# Patient Record
Sex: Male | Born: 1960 | Race: Black or African American | Hispanic: No | Marital: Single | Smoking: Former smoker
Health system: Southern US, Community
[De-identification: ages and names within clinical notes are randomized; demographics above are authoritative.]

## PROBLEM LIST (undated history)

## (undated) DIAGNOSIS — D638 Anemia in other chronic diseases classified elsewhere: Secondary | ICD-10-CM

## (undated) DIAGNOSIS — Z992 Dependence on renal dialysis: Secondary | ICD-10-CM

## (undated) DIAGNOSIS — N289 Disorder of kidney and ureter, unspecified: Secondary | ICD-10-CM

## (undated) DIAGNOSIS — N186 End stage renal disease: Secondary | ICD-10-CM

## (undated) DIAGNOSIS — E119 Type 2 diabetes mellitus without complications: Secondary | ICD-10-CM

## (undated) DIAGNOSIS — I10 Essential (primary) hypertension: Secondary | ICD-10-CM

## (undated) DIAGNOSIS — I5022 Chronic systolic (congestive) heart failure: Secondary | ICD-10-CM

## (undated) DIAGNOSIS — I480 Paroxysmal atrial fibrillation: Secondary | ICD-10-CM

## (undated) HISTORY — PX: INSERTION OF DIALYSIS CATHETER: SHX1324

---

## 1985-10-13 HISTORY — PX: LEG SURGERY: SHX1003

## 2013-07-09 ENCOUNTER — Emergency Department: Payer: Self-pay | Admitting: Emergency Medicine

## 2014-07-19 DIAGNOSIS — E119 Type 2 diabetes mellitus without complications: Secondary | ICD-10-CM | POA: Insufficient documentation

## 2014-07-24 DIAGNOSIS — R809 Proteinuria, unspecified: Secondary | ICD-10-CM | POA: Insufficient documentation

## 2018-08-29 ENCOUNTER — Encounter: Payer: Self-pay | Admitting: Emergency Medicine

## 2018-08-29 ENCOUNTER — Other Ambulatory Visit: Payer: Self-pay

## 2018-08-29 DIAGNOSIS — Z9114 Patient's other noncompliance with medication regimen: Secondary | ICD-10-CM

## 2018-08-29 DIAGNOSIS — E8809 Other disorders of plasma-protein metabolism, not elsewhere classified: Secondary | ICD-10-CM | POA: Diagnosis present

## 2018-08-29 DIAGNOSIS — N184 Chronic kidney disease, stage 4 (severe): Secondary | ICD-10-CM | POA: Diagnosis present

## 2018-08-29 DIAGNOSIS — Z7984 Long term (current) use of oral hypoglycemic drugs: Secondary | ICD-10-CM

## 2018-08-29 DIAGNOSIS — M436 Torticollis: Secondary | ICD-10-CM | POA: Diagnosis present

## 2018-08-29 DIAGNOSIS — K047 Periapical abscess without sinus: Secondary | ICD-10-CM | POA: Diagnosis present

## 2018-08-29 DIAGNOSIS — N179 Acute kidney failure, unspecified: Principal | ICD-10-CM | POA: Diagnosis present

## 2018-08-29 DIAGNOSIS — Z91120 Patient's intentional underdosing of medication regimen due to financial hardship: Secondary | ICD-10-CM

## 2018-08-29 DIAGNOSIS — I161 Hypertensive emergency: Secondary | ICD-10-CM | POA: Diagnosis present

## 2018-08-29 DIAGNOSIS — Z79899 Other long term (current) drug therapy: Secondary | ICD-10-CM

## 2018-08-29 DIAGNOSIS — Z833 Family history of diabetes mellitus: Secondary | ICD-10-CM

## 2018-08-29 DIAGNOSIS — D631 Anemia in chronic kidney disease: Secondary | ICD-10-CM | POA: Diagnosis present

## 2018-08-29 DIAGNOSIS — I129 Hypertensive chronic kidney disease with stage 1 through stage 4 chronic kidney disease, or unspecified chronic kidney disease: Secondary | ICD-10-CM | POA: Diagnosis present

## 2018-08-29 DIAGNOSIS — Z23 Encounter for immunization: Secondary | ICD-10-CM

## 2018-08-29 DIAGNOSIS — E1165 Type 2 diabetes mellitus with hyperglycemia: Secondary | ICD-10-CM | POA: Diagnosis present

## 2018-08-29 DIAGNOSIS — E1122 Type 2 diabetes mellitus with diabetic chronic kidney disease: Secondary | ICD-10-CM | POA: Diagnosis present

## 2018-08-29 LAB — GLUCOSE, CAPILLARY: Glucose-Capillary: 116 mg/dL — ABNORMAL HIGH (ref 70–99)

## 2018-08-29 NOTE — ED Notes (Signed)
Pt reports that he has a hx of HTN but is un medicated and has not seen an MD x 2 years ago.

## 2018-08-29 NOTE — ED Notes (Signed)
Pt reports just noticing some swelling to the left side of his face, near left ear and along jaw line; denies new medications; says he has poor dentition but no pain at this time

## 2018-08-29 NOTE — ED Triage Notes (Signed)
Pt is ambulatory to triage with c/o neck swelling and stiffness x 1 hour ago. Pt also reports that he is experiencing some facial swelling at this time. Pt denies eating or being exposed to anything new at this time. Pt is in NAD a this time.

## 2018-08-29 NOTE — ED Notes (Signed)
Per MD Archie Balboa, no further protocols at this time.

## 2018-08-30 ENCOUNTER — Encounter: Payer: Self-pay | Admitting: Internal Medicine

## 2018-08-30 ENCOUNTER — Inpatient Hospital Stay
Admission: EM | Admit: 2018-08-30 | Discharge: 2018-09-01 | DRG: 683 | Disposition: A | Discharge status: Home / Self Care | Payer: BLUE CROSS/BLUE SHIELD | Attending: Internal Medicine | Admitting: Internal Medicine

## 2018-08-30 DIAGNOSIS — N189 Chronic kidney disease, unspecified: Secondary | ICD-10-CM

## 2018-08-30 DIAGNOSIS — N184 Chronic kidney disease, stage 4 (severe): Secondary | ICD-10-CM | POA: Diagnosis present

## 2018-08-30 DIAGNOSIS — Z23 Encounter for immunization: Secondary | ICD-10-CM | POA: Diagnosis not present

## 2018-08-30 DIAGNOSIS — I129 Hypertensive chronic kidney disease with stage 1 through stage 4 chronic kidney disease, or unspecified chronic kidney disease: Secondary | ICD-10-CM | POA: Diagnosis present

## 2018-08-30 DIAGNOSIS — E8809 Other disorders of plasma-protein metabolism, not elsewhere classified: Secondary | ICD-10-CM | POA: Diagnosis present

## 2018-08-30 DIAGNOSIS — K047 Periapical abscess without sinus: Secondary | ICD-10-CM | POA: Diagnosis present

## 2018-08-30 DIAGNOSIS — N179 Acute kidney failure, unspecified: Secondary | ICD-10-CM

## 2018-08-30 DIAGNOSIS — M436 Torticollis: Secondary | ICD-10-CM | POA: Diagnosis present

## 2018-08-30 DIAGNOSIS — I10 Essential (primary) hypertension: Secondary | ICD-10-CM

## 2018-08-30 DIAGNOSIS — Z91120 Patient's intentional underdosing of medication regimen due to financial hardship: Secondary | ICD-10-CM | POA: Diagnosis not present

## 2018-08-30 DIAGNOSIS — E1122 Type 2 diabetes mellitus with diabetic chronic kidney disease: Secondary | ICD-10-CM | POA: Diagnosis present

## 2018-08-30 DIAGNOSIS — Z833 Family history of diabetes mellitus: Secondary | ICD-10-CM | POA: Diagnosis not present

## 2018-08-30 DIAGNOSIS — E1169 Type 2 diabetes mellitus with other specified complication: Secondary | ICD-10-CM

## 2018-08-30 DIAGNOSIS — E1165 Type 2 diabetes mellitus with hyperglycemia: Secondary | ICD-10-CM | POA: Diagnosis present

## 2018-08-30 DIAGNOSIS — Z9114 Patient's other noncompliance with medication regimen: Secondary | ICD-10-CM | POA: Diagnosis not present

## 2018-08-30 DIAGNOSIS — Z7984 Long term (current) use of oral hypoglycemic drugs: Secondary | ICD-10-CM | POA: Diagnosis not present

## 2018-08-30 DIAGNOSIS — I161 Hypertensive emergency: Secondary | ICD-10-CM

## 2018-08-30 DIAGNOSIS — Z79899 Other long term (current) drug therapy: Secondary | ICD-10-CM | POA: Diagnosis not present

## 2018-08-30 DIAGNOSIS — D631 Anemia in chronic kidney disease: Secondary | ICD-10-CM | POA: Diagnosis present

## 2018-08-30 HISTORY — DX: Type 2 diabetes mellitus without complications: E11.9

## 2018-08-30 HISTORY — DX: Essential (primary) hypertension: I10

## 2018-08-30 HISTORY — DX: Acute kidney failure, unspecified: N17.9

## 2018-08-30 LAB — COMPREHENSIVE METABOLIC PANEL
ALK PHOS: 103 U/L (ref 38–126)
ALT: 51 U/L — AB (ref 0–44)
AST: 56 U/L — AB (ref 15–41)
Albumin: 1.8 g/dL — ABNORMAL LOW (ref 3.5–5.0)
Anion gap: 3 — ABNORMAL LOW (ref 5–15)
BUN: 41 mg/dL — AB (ref 6–20)
CALCIUM: 7.4 mg/dL — AB (ref 8.9–10.3)
CHLORIDE: 114 mmol/L — AB (ref 98–111)
CO2: 23 mmol/L (ref 22–32)
Creatinine, Ser: 4.69 mg/dL — ABNORMAL HIGH (ref 0.61–1.24)
GFR calc non Af Amer: 13 mL/min — ABNORMAL LOW (ref >60)
GFR, EST AFRICAN AMERICAN: 14 mL/min — AB (ref >60)
GLUCOSE: 98 mg/dL (ref 70–99)
Potassium: 3.8 mmol/L (ref 3.5–5.1)
SODIUM: 140 mmol/L (ref 135–145)
Total Bilirubin: 0.4 mg/dL (ref 0.3–1.2)
Total Protein: 5.2 g/dL — ABNORMAL LOW (ref 6.5–8.1)

## 2018-08-30 LAB — GLUCOSE, CAPILLARY
Glucose-Capillary: 164 mg/dL — ABNORMAL HIGH (ref 70–99)
Glucose-Capillary: 161 mg/dL — ABNORMAL HIGH (ref 70–99)
Glucose-Capillary: 125 mg/dL — ABNORMAL HIGH (ref 70–99)
Glucose-Capillary: 114 mg/dL — ABNORMAL HIGH (ref 70–99)
Glucose-Capillary: 130 mg/dL — ABNORMAL HIGH (ref 70–99)

## 2018-08-30 LAB — URINALYSIS, ROUTINE W REFLEX MICROSCOPIC
BILIRUBIN URINE: NEGATIVE
Bacteria, UA: NONE SEEN
GLUCOSE, UA: 150 mg/dL — AB
Hgb urine dipstick: NEGATIVE
Ketones, ur: NEGATIVE mg/dL
Leukocytes, UA: NEGATIVE
NITRITE: NEGATIVE
Protein, ur: >=300 mg/dL — AB
SPECIFIC GRAVITY, URINE: 1.012 (ref 1.005–1.030)
Squamous Epithelial / LPF: NONE SEEN (ref 0–5)
pH: 6 (ref 5.0–8.0)

## 2018-08-30 LAB — CBC WITH DIFFERENTIAL/PLATELET
Abs Immature Granulocytes: 0.03 10*3/uL (ref 0.00–0.07)
BASOS PCT: 0 %
Basophils Absolute: 0 10*3/uL (ref 0.0–0.1)
EOS PCT: 2 %
Eosinophils Absolute: 0.1 10*3/uL (ref 0.0–0.5)
HCT: 32.3 % — ABNORMAL LOW (ref 39.0–52.0)
HEMOGLOBIN: 11 g/dL — AB (ref 13.0–17.0)
Immature Granulocytes: 0 %
LYMPHS PCT: 25 %
Lymphs Abs: 1.8 10*3/uL (ref 0.7–4.0)
MCH: 30.1 pg (ref 26.0–34.0)
MCHC: 34.1 g/dL (ref 30.0–36.0)
MCV: 88.5 fL (ref 80.0–100.0)
MONO ABS: 0.5 10*3/uL (ref 0.1–1.0)
MONOS PCT: 7 %
Neutro Abs: 4.8 10*3/uL (ref 1.7–7.7)
Neutrophils Relative %: 66 %
Platelets: 227 10*3/uL (ref 150–400)
RBC: 3.65 MIL/uL — AB (ref 4.22–5.81)
RDW: 12.8 % (ref 11.5–15.5)
WBC: 7.3 10*3/uL (ref 4.0–10.5)
nRBC: 0 % (ref 0.0–0.2)

## 2018-08-30 LAB — PHOSPHORUS: PHOSPHORUS: 4 mg/dL (ref 2.5–4.6)

## 2018-08-30 LAB — PROTEIN / CREATININE RATIO, URINE
CREATININE, URINE: 72 mg/dL
PROTEIN CREATININE RATIO: 11.19 mg/mg{creat} — AB (ref 0.00–0.15)
Total Protein, Urine: 806 mg/dL

## 2018-08-30 LAB — CREATININE, URINE, RANDOM: Creatinine, Urine: 75 mg/dL

## 2018-08-30 LAB — HEMOGLOBIN A1C
Hgb A1c MFr Bld: 5.5 % (ref 4.8–5.6)
Mean Plasma Glucose: 111.15 mg/dL

## 2018-08-30 LAB — TSH: TSH: 4.96 u[IU]/mL — ABNORMAL HIGH (ref 0.350–4.500)

## 2018-08-30 LAB — SODIUM, URINE, RANDOM: Sodium, Ur: 66 mmol/L

## 2018-08-30 MED ORDER — PNEUMOCOCCAL VAC POLYVALENT 25 MCG/0.5ML IJ INJ
0.5000 mL | INJECTION | INTRAMUSCULAR | Status: AC
  Administered 2018-09-01: 0.5 mL via INTRAMUSCULAR
  Filled 2018-08-30: qty 0.5

## 2018-08-30 MED ORDER — ONDANSETRON HCL 4 MG/2ML IJ SOLN: 4.0000 mg | Freq: Four times a day (QID) | INTRAMUSCULAR | Status: DC | PRN

## 2018-08-30 MED ORDER — METFORMIN HCL 1000 MG PO TABS: 1000.0000 mg | ORAL_TABLET | Freq: Two times a day (BID) | ORAL | 0 refills | Status: DC

## 2018-08-30 MED ORDER — PENICILLIN V POTASSIUM 500 MG PO TABS: 500.0000 mg | ORAL_TABLET | Freq: Four times a day (QID) | ORAL | 0 refills | Status: DC

## 2018-08-30 MED ORDER — NITROGLYCERIN 2 % TD OINT
0.5000 [in_us] | TOPICAL_OINTMENT | Freq: Four times a day (QID) | TRANSDERMAL | Status: DC
  Administered 2018-08-30 – 2018-09-01 (×8): 0.5 [in_us] via TOPICAL
  Filled 2018-08-30 (×8): qty 1

## 2018-08-30 MED ORDER — INSULIN ASPART 100 UNIT/ML ~~LOC~~ SOLN
0.0000 [IU] | Freq: Three times a day (TID) | SUBCUTANEOUS | Status: DC
  Administered 2018-08-30: 2 [IU] via SUBCUTANEOUS
  Administered 2018-08-30: 1 [IU] via SUBCUTANEOUS
  Administered 2018-08-31: 2 [IU] via SUBCUTANEOUS
  Filled 2018-08-30 (×3): qty 1

## 2018-08-30 MED ORDER — ACETAMINOPHEN 650 MG RE SUPP: 650.0000 mg | Freq: Four times a day (QID) | RECTAL | Status: DC | PRN

## 2018-08-30 MED ORDER — DOCUSATE SODIUM 100 MG PO CAPS
100.0000 mg | ORAL_CAPSULE | Freq: Two times a day (BID) | ORAL | Status: DC
  Administered 2018-08-30 – 2018-09-01 (×4): 100 mg via ORAL
  Filled 2018-08-30 (×5): qty 1

## 2018-08-30 MED ORDER — ONDANSETRON HCL 4 MG PO TABS: 4.0000 mg | ORAL_TABLET | Freq: Four times a day (QID) | ORAL | Status: DC | PRN

## 2018-08-30 MED ORDER — LABETALOL HCL 5 MG/ML IV SOLN
40.0000 mg | Freq: Once | INTRAVENOUS | Status: AC
  Administered 2018-08-30: 40 mg via INTRAVENOUS
  Filled 2018-08-30: qty 8

## 2018-08-30 MED ORDER — AMLODIPINE BESYLATE 5 MG PO TABS: 5.0000 mg | ORAL_TABLET | Freq: Every day | ORAL | 0 refills | Status: DC

## 2018-08-30 MED ORDER — PENICILLIN V POTASSIUM 250 MG PO TABS
500.0000 mg | ORAL_TABLET | Freq: Once | ORAL | Status: AC
  Administered 2018-08-30: 500 mg via ORAL
  Filled 2018-08-30: qty 2

## 2018-08-30 MED ORDER — ACETAMINOPHEN 325 MG PO TABS: 650.0000 mg | ORAL_TABLET | Freq: Four times a day (QID) | ORAL | Status: DC | PRN

## 2018-08-30 MED ORDER — AMOXICILLIN-POT CLAVULANATE 500-125 MG PO TABS
1.0000 | ORAL_TABLET | Freq: Two times a day (BID) | ORAL | Status: DC
  Administered 2018-08-30 – 2018-09-01 (×4): 500 mg via ORAL
  Filled 2018-08-30 (×6): qty 1

## 2018-08-30 MED ORDER — AMOXICILLIN 500 MG PO CAPS
500.0000 mg | ORAL_CAPSULE | Freq: Two times a day (BID) | ORAL | Status: DC
  Administered 2018-08-30: 500 mg via ORAL
  Filled 2018-08-30 (×2): qty 1

## 2018-08-30 MED ORDER — AMLODIPINE BESYLATE 10 MG PO TABS
10.0000 mg | ORAL_TABLET | Freq: Every day | ORAL | Status: DC
  Administered 2018-08-30 – 2018-09-01 (×3): 10 mg via ORAL
  Filled 2018-08-30 (×3): qty 1

## 2018-08-30 MED ORDER — INSULIN ASPART 100 UNIT/ML ~~LOC~~ SOLN: 0.0000 [IU] | Freq: Every day | SUBCUTANEOUS | Status: DC

## 2018-08-30 MED ORDER — LABETALOL HCL 5 MG/ML IV SOLN: 5.0000 mg | INTRAVENOUS | Status: DC | PRN

## 2018-08-30 MED ORDER — INFLUENZA VAC SPLIT QUAD 0.5 ML IM SUSY
0.5000 mL | PREFILLED_SYRINGE | INTRAMUSCULAR | Status: AC
  Administered 2018-09-01: 0.5 mL via INTRAMUSCULAR
  Filled 2018-08-30: qty 0.5

## 2018-08-30 MED ORDER — HEPARIN SODIUM (PORCINE) 5000 UNIT/ML IJ SOLN
5000.0000 [IU] | Freq: Three times a day (TID) | INTRAMUSCULAR | Status: DC
  Administered 2018-08-30 – 2018-09-01 (×7): 5000 [IU] via SUBCUTANEOUS
  Filled 2018-08-30 (×7): qty 1

## 2018-08-30 NOTE — ED Notes (Signed)
Patient reports that he has not taken any medication for hypertension or diabetes in approximately 2 years due to cost. Patient reports he has not had any blood work drawn for approximately the same amount.

## 2018-08-30 NOTE — Care Management (Signed)
RNCM consulted for medication needs. Reported that patient has been unable to get his medications for 2 years.   Per patient the above information is incorrect.  Patient states that he has had [employer] with medication coverage since 2016.  Patient states that since he had last weight he had not been checking his CBG or taking his DM medication.  Patient states "I have never been in a position where I couldn't get them".  Patient states that he has a glucometer at home, however it has been so long since he checked his CBG, that he wouldn't know where to find it.  Patient states "I will get a new one at [facility] after I get out of here"  RNCM requested DM consult  Patient does not have PCP.  Initially patient was hesitant to set up a new patient appointment, however he is now agreeable. Patient wishes to establish at [facility], as he has been a patient there before.  RNCM placed this request in the discharge follow up section of Epic.

## 2018-08-30 NOTE — Consult Note (Signed)
[facility]  CONSULT NOTE    Date: 08/30/2018                  Patient Name:  Casey Franco  MRN: [id number]  DOB: 03-Oct-1961  Age / Sex: 57 y.o., male         PCP: Patient, No Pcp Per                 Service Requesting Consult: Dr. Marcille Blanco                 Reason for Consult: Acute renal failure            History of Present Illness: Casey Franco is a 57 y.o. black male with diabetes mellitus type II, hypertension, who was admitted to [facility] on 08/30/2018 for Hypoalbuminemia [E88.09] Dental infection [K04.7] Hypertensive emergency [I16.1] Type 2 diabetes mellitus with other specified complication, without long-term current use of insulin ([place]) [E11.69] Chronic kidney disease, unspecified CKD stage [N18.9] Hypertension, unspecified type [I10]   Patient has not been seen by a medical provider for more than 2 years. He has not been taking any medications as outpatient.   Currently denies any symptoms other than his face swelling from dental abscess.    Medications: Outpatient medications: No medications prior to admission.    Current medications: Current Facility-Administered Medications  Medication Dose Route Frequency Provider Last Rate Last Dose  . acetaminophen (TYLENOL) tablet 650 mg  650 mg Oral Q6H PRN Harrie Foreman, MD       Or  . acetaminophen (TYLENOL) suppository 650 mg  650 mg Rectal Q6H PRN Harrie Foreman, MD      . amLODipine (NORVASC) tablet 10 mg  10 mg Oral Daily Harrie Foreman, MD   10 mg at 08/30/18 0650  . amoxicillin (AMOXIL) capsule 500 mg  500 mg Oral Q12H Harrie Foreman, MD   500 mg at 08/30/18 0945  . docusate sodium (COLACE) capsule 100 mg  100 mg Oral BID Harrie Foreman, MD      . heparin injection 5,000 Units  5,000 Units Subcutaneous Q8H Harrie Foreman, MD   5,000 Units at 08/30/18 [phone]  . [START ON 08/31/2018] Influenza vac split quadrivalent PF (FLUARIX) injection 0.5 mL  0.5 mL  Intramuscular Tomorrow-1000 Harrie Foreman, MD      . insulin aspart (novoLOG) injection 0-5 Units  0-5 Units Subcutaneous QHS Harrie Foreman, MD      . insulin aspart (novoLOG) injection 0-9 Units  0-9 Units Subcutaneous TID WC Harrie Foreman, MD   2 Units at 08/30/18 0945  . labetalol (NORMODYNE,TRANDATE) injection 5-10 mg  5-10 mg Intravenous Q2H PRN Harrie Foreman, MD      . nitroGLYCERIN (NITROGLYN) 2 % ointment 0.5 inch  0.5 inch Topical Q6H Harrie Foreman, MD   0.5 inch at 08/30/18 0535  . ondansetron (ZOFRAN) tablet 4 mg  4 mg Oral Q6H PRN Harrie Foreman, MD       Or  . ondansetron [facility]) injection 4 mg  4 mg Intravenous Q6H PRN Harrie Foreman, MD      . Derrill Memo ON 08/31/2018] pneumococcal 23 valent vaccine (PNU-IMMUNE) injection 0.5 mL  0.5 mL Intramuscular Tomorrow-1000 Harrie Foreman, MD          Allergies: No Known Allergies    Past Medical History: Past Medical History:  Diagnosis Date  . Diabetes mellitus without complication ([place])   .  Hypertension      Past Surgical History: Past Surgical History:  Procedure Laterality Date  . LEG SURGERY  1987     Family History: Family History  Problem Relation Age of Onset  . Diabetes Mellitus II Sister   . Diabetes Mellitus II Paternal Grandmother      Social History: Social History   Socioeconomic History  . Marital status: Single    Spouse name: Not on file  . Number of children: Not on file  . Years of education: Not on file  . Highest education level: Not on file  Occupational History  . Not on file  Social Needs  . Financial resource strain: Not on file  . Food insecurity:    Worry: Not on file    Inability: Not on file  . Transportation needs:    Medical: Not on file    Non-medical: Not on file  Tobacco Use  . Smoking status: Never Smoker  . Smokeless tobacco: Never Used  Substance and Sexual Activity  . Alcohol use: Yes  . Drug use: Yes    Types: Cocaine     Comment: Friday 08/27/2018  . Sexual activity: Not on file  Lifestyle  . Physical activity:    Days per week: Not on file    Minutes per session: Not on file  . Stress: Not on file  Relationships  . Social connections:    Talks on phone: Not on file    Gets together: Not on file    Attends religious service: Not on file    Active member of club or organization: Not on file    Attends meetings of clubs or organizations: Not on file    Relationship status: Not on file  . Intimate partner violence:    Fear of current or ex partner: Not on file    Emotionally abused: Not on file    Physically abused: Not on file    Forced sexual activity: Not on file  Other Topics Concern  . Not on file  Social History Narrative  . Not on file     Review of Systems: Review of Systems  Constitutional: Negative.   HENT: Negative.   Eyes: Negative.   Respiratory: Negative.   Cardiovascular: Negative.   Gastrointestinal: Negative.   Genitourinary: Negative.   Musculoskeletal: Negative.   Skin: Negative.   Neurological: Negative.   Endo/Heme/Allergies: Negative.   Psychiatric/Behavioral: Negative.     Vital Signs: Blood pressure 135/89, pulse 80, temperature 98.3 F (36.8 C), temperature source Oral, resp. rate 16, height 5' 11.5" (1.816 m), weight 63.5 kg, SpO2 100 %.  Weight trends: Filed Weights   08/29/18 2222  Weight: 63.5 kg    Physical Exam: General: NAD, laying in bed  Head: Normocephalic, atraumatic. Moist oral mucosal membranes  Eyes: Anicteric, PERRL  Neck: Supple, trachea midline  Lungs:  Clear to auscultation  Heart: Regular rate and rhythm  Abdomen:  Soft, nontender,   Extremities: no peripheral edema.  Neurologic: Nonfocal, moving all four extremities  Skin: No lesions  Access: none     Lab results: Basic Metabolic Panel: Recent Labs  Lab 08/30/18 0128  NA 140  K 3.8  CL 114*  CO2 23  GLUCOSE 98  BUN 41*  CREATININE 4.69*  CALCIUM 7.4*    Liver  Function Tests: Recent Labs  Lab 08/30/18 0128  AST 56*  ALT 51*  ALKPHOS 103  BILITOT 0.4  PROT 5.2*  ALBUMIN 1.8*   No results for  input(s): LIPASE, AMYLASE in the last 168 hours. No results for input(s): AMMONIA in the last 168 hours.  CBC: Recent Labs  Lab 08/30/18 0128  WBC 7.3  NEUTROABS 4.8  HGB 11.0*  HCT 32.3*  MCV 88.5  PLT 227    Cardiac Enzymes: No results for input(s): CKTOTAL, CKMB, CKMBINDEX, TROPONINI in the last 168 hours.  BNP: Invalid input(s): POCBNP  CBG: Recent Labs  Lab 08/29/18 2234 08/30/18 0747 08/30/18 1141  GLUCAP 116* 164* 161*    Microbiology: No results found for this or any previous visit.  Coagulation Studies: No results for input(s): LABPROT, INR in the last 72 hours.  Urinalysis: No results for input(s): COLORURINE, LABSPEC, PHURINE, GLUCOSEU, HGBUR, BILIRUBINUR, KETONESUR, PROTEINUR, UROBILINOGEN, NITRITE, LEUKOCYTESUR in the last 72 hours.  Invalid input(s): APPERANCEUR    Imaging:  No results found.   Assessment & Plan: Casey Franco is a 57 y.o. black male with diabetes mellitus type II, hypertension, who was admitted to [facility] on 08/30/2018  1. Acute renal failure on chronic kidney disease NOS versus progression of chronic kidney disease to stage V.  Last creatinine available is 1.3, normal GFR on 07/10/2014.  No acute indication for dialysis. Dialysis education provided.  - Check SPEP/UPEP, ANA, ANCA, anti-GBM, HIV, viral hepatitis panel, serum complements - Check urine studies - Check renal ultrasound - No indication for IV fluids  2. Hypertension: elevated. Started on amlodipine.   3. Anemia of chronic kidney disease: hemoglobin 11  4. Hypocalcemia: corrected calcium of 9.2. Due hypoalbuminemia - Check PTH and phosphorus.     LOS: 0 Amorette Charrette 11/18/20191:54 PM

## 2018-08-30 NOTE — H&P (Signed)
Casey Franco is an 57 y.o. male.   Chief Complaint: Facial swelling HPI: The patient with past medical history of hypertension and diabetes presents to the emergency department concerned about swelling of his lower jaw.  The patient denies pain or fever but admits to fullness and tenderness of his lower jaw bilaterally.  He has had issues with his dentition before but does not recall when his mouth began to swell with this encounter.  He admits to feeling generally unwell and notes that he is not been controlling his diabetes.  In the emergency department he was found to have tolerated hypertension that was not responsive to labetalol.  Laboratory evaluation also revealed severe renal insufficiency which prompted the emergency department staff to call hospitalist service for admission.  Past Medical History:  Diagnosis Date  . Diabetes mellitus without complication ([place])   . Hypertension     Past Surgical History:  Procedure Laterality Date  . LEG SURGERY  1987    Family History  Problem Relation Age of Onset  . Diabetes Mellitus II Sister   . Diabetes Mellitus II Paternal Grandmother    Social History:  reports that he has never smoked. He has never used smokeless tobacco. He reports that he drinks alcohol. He reports that he has current or past drug history. Drug: Cocaine.  Allergies: No Known Allergies  No medications prior to admission.    Results for orders placed or performed during the hospital encounter of 08/30/18 (from the past 48 hour(s))  Glucose, capillary     Status: Abnormal   Collection Time: 08/29/18 10:34 PM  Result Value Ref Range   Glucose-Capillary 116 (H) 70 - 99 mg/dL  Comprehensive metabolic panel     Status: Abnormal   Collection Time: 08/30/18  1:28 AM  Result Value Ref Range   Sodium 140 135 - 145 mmol/L   Potassium 3.8 3.5 - 5.1 mmol/L   Chloride 114 (H) 98 - 111 mmol/L   CO2 23 22 - 32 mmol/L   Glucose, Bld 98 70 - 99 mg/dL   BUN 41 (H) 6 - 20  mg/dL   Creatinine, Ser 4.69 (H) 0.61 - 1.24 mg/dL   Calcium 7.4 (L) 8.9 - 10.3 mg/dL   Total Protein 5.2 (L) 6.5 - 8.1 g/dL   Albumin 1.8 (L) 3.5 - 5.0 g/dL   AST 56 (H) 15 - 41 U/L   ALT 51 (H) 0 - 44 U/L   Alkaline Phosphatase 103 38 - 126 U/L   Total Bilirubin 0.4 0.3 - 1.2 mg/dL   GFR calc non Af Amer 13 (L) >60 mL/min   GFR calc Af Amer 14 (L) >60 mL/min    Comment: (NOTE) The eGFR has been calculated using the CKD EPI equation. This calculation has not been validated in all clinical situations. eGFR's persistently <60 mL/min signify possible Chronic Kidney Disease.    Anion gap 3 (L) 5 - 15    Comment: Performed at [facility], [address] [place], [place] [address]  CBC with Differential     Status: Abnormal   Collection Time: 08/30/18  1:28 AM  Result Value Ref Range   WBC 7.3 4.0 - 10.5 K/uL   RBC 3.65 (L) 4.22 - 5.81 MIL/uL   Hemoglobin 11.0 (L) 13.0 - 17.0 g/dL   HCT 32.3 (L) 39.0 - 52.0 %   MCV 88.5 80.0 - 100.0 fL   MCH 30.1 26.0 - 34.0 pg   MCHC 34.1 30.0 - 36.0  g/dL   RDW 12.8 11.5 - 15.5 %   Platelets 227 150 - 400 K/uL   nRBC 0.0 0.0 - 0.2 %   Neutrophils Relative % 66 %   Neutro Abs 4.8 1.7 - 7.7 K/uL   Lymphocytes Relative 25 %   Lymphs Abs 1.8 0.7 - 4.0 K/uL   Monocytes Relative 7 %   Monocytes Absolute 0.5 0.1 - 1.0 K/uL   Eosinophils Relative 2 %   Eosinophils Absolute 0.1 0.0 - 0.5 K/uL   Basophils Relative 0 %   Basophils Absolute 0.0 0.0 - 0.1 K/uL   Immature Granulocytes 0 %   Abs Immature Granulocytes 0.03 0.00 - 0.07 K/uL    Comment: Performed at [facility], [address] [place], [place] [address]   No results found.  Review of Systems  Constitutional: Negative for chills and fever.  HENT: Negative for sore throat and tinnitus.   Eyes: Negative for blurred vision and redness.  Respiratory: Negative for cough and shortness of breath.   Cardiovascular: Negative for chest pain, palpitations, orthopnea and  PND.  Gastrointestinal: Negative for abdominal pain, diarrhea, nausea and vomiting.  Genitourinary: Negative for dysuria, frequency and urgency.  Musculoskeletal: Negative for joint pain and myalgias.  Skin: Negative for rash.       No lesions  Neurological: Negative for speech change, focal weakness and weakness.  Endo/Heme/Allergies: Does not bruise/bleed easily.       No temperature intolerance  Psychiatric/Behavioral: Negative for depression and suicidal ideas.    Blood pressure (!) 195/131, pulse 71, temperature 98.2 F (36.8 C), temperature source Oral, resp. rate 18, height 5' 11.5" (1.816 m), weight 63.5 kg, SpO2 100 %. Physical Exam  Vitals reviewed. Constitutional: He is oriented to person, place, and time. He appears well-developed and well-nourished. No distress.  HENT:  Head: Normocephalic and atraumatic.  Mouth/Throat: Oropharynx is clear and moist.  Eyes: Pupils are equal, round, and reactive to light. Conjunctivae and EOM are normal. No scleral icterus.  Neck: Normal range of motion. Neck supple. No JVD present. No tracheal deviation present. No thyromegaly present.  Cardiovascular: Normal rate, regular rhythm and normal heart sounds. Exam reveals no gallop and no friction rub.  No murmur heard. Respiratory: Effort normal and breath sounds normal. No respiratory distress.  GI: Soft. Bowel sounds are normal. He exhibits no distension. There is no tenderness.  Genitourinary:  Genitourinary Comments: Deferred  Musculoskeletal: Normal range of motion. He exhibits no edema.  Lymphadenopathy:    He has no cervical adenopathy.  Neurological: He is alert and oriented to person, place, and time. No cranial nerve deficit.  Skin: Skin is warm and dry. No rash noted. No erythema.  Psychiatric: He has a normal mood and affect. His behavior is normal. Judgment and thought content normal.     Assessment/Plan This is a 57 year old male admitted for acute kidney injury. 1.  Acute  kidney injury: Baseline renal function not available.  The patient likely has a long-standing renal insufficiency secondary to uncontrolled diabetes and hypertension.  Hydrate with intravenous fluid.  Obtain urine sodium and creatinine.  Consult nephrology. 2.  Hypertension: Uncontrolled.  I have started the patient on amlodipine.  I also applied Nitropaste to his chest.  Labetalol as needed 3.  Diabetes mellitus type 2: Rating scale insulin while hospitalized.  Check hemoglobin A1c 4.  Dental abscess: No signs or symptoms of sepsis or airway compromise.  Continue amoxicillin 5.  DVT prophylaxis: Heparin 6.  GI prophylaxis: None  Patient is a full code.  Time spent on admission orders and patient care approximately 45 minutes  Harrie Foreman, MD 08/30/2018, 6:31 AM

## 2018-08-30 NOTE — Progress Notes (Signed)
Patient admitted to [facility] with the diagnosis of AKI. Alert and oriented x 4. Denied any acute pain at this time. Patient oriented to his room, ascom, call bell and staff. Full assessment to epic completed.  Admission BP=195/131, Norvasc scheduled administered and will be rechecked in an hour ans see if he will need PRN BP med. Will continue to monitor.

## 2018-08-30 NOTE — ED Provider Notes (Signed)
[facility] Emergency Department Provider Note  ____________________________________________   First MD Initiated Contact with Patient 08/30/18 0119     (approximate)  I have reviewed the triage vital signs and the nursing notes.   HISTORY  Chief Complaint Torticollis   HPI Casey Franco is a 57 y.o. male who self presents to the emergency department with painless swelling to the left side of his face near his left ear and along the right side of his face that he noticed earlier this evening.  He was in his usual state of health when he went to the bathroom and and looking at the mirror noticed that his face was swollen and await it never been before which concerned him.  The patient has a long-standing past medical history of both hypertension and diabetes mellitus although he is taken no medications in over 2 years as he has not had health insurance.  He recently started a new job and became insured again and he is interested in restarting his medications.  He denies fevers or chills.  He denies chest pain or shortness of breath.  He denies abdominal pain nausea or vomiting.  He has poor dentition and has not seen a dentist in "a long time".  He denies difficulty swallowing or voice changes.  He denies ear pain.  He denies headache.  He does have a remote history of heavy alcohol abuse in his 59s along with remote IV cocaine use.  He continues to drink beer daily although "only 1 or 2".  His symptoms today came on suddenly and seemed to have quickly gone away nearly completely on their own.  Nothing seems to make them better or worse.    Past Medical History:  Diagnosis Date  . Diabetes mellitus without complication ([place])   . Hypertension     Patient Active Problem List   Diagnosis Date Noted  . AKI (acute kidney injury) ([place]) 08/30/2018    Past Surgical History:  Procedure Laterality Date  . LEG SURGERY  1987    Prior to Admission medications     Medication Sig Start Date End Date Taking? Authorizing Provider  amLODipine (NORVASC) 5 MG tablet Take 1 tablet (5 mg total) by mouth daily. 08/30/18 09/29/18  Darel Hong, MD  metFORMIN (GLUCOPHAGE) 1000 MG tablet Take 1 tablet (1,000 mg total) by mouth 2 (two) times daily. 08/30/18 08/30/19  Darel Hong, MD  penicillin v potassium (VEETID) 500 MG tablet Take 1 tablet (500 mg total) by mouth 4 (four) times daily for 10 days. 08/30/18 09/09/18  Darel Hong, MD    Allergies Patient has no known allergies.  Family History  Problem Relation Age of Onset  . Diabetes Mellitus II Sister   . Diabetes Mellitus II Paternal Grandmother     Social History Social History   Tobacco Use  . Smoking status: Never Smoker  . Smokeless tobacco: Never Used  Substance Use Topics  . Alcohol use: Yes  . Drug use: Yes    Types: Cocaine    Comment: Friday 08/27/2018    Review of Systems Constitutional: No fever/chills Eyes: No visual changes. ENT: Positive for facial swelling Cardiovascular: Denies chest pain. Respiratory: Denies shortness of breath. Gastrointestinal: No abdominal pain.  No nausea, no vomiting.  No diarrhea.  No constipation. Genitourinary: Negative for dysuria. Musculoskeletal: Negative for back pain. Skin: Negative for rash. Neurological: Negative for headaches, focal weakness or numbness.   ____________________________________________   PHYSICAL EXAM:  VITAL SIGNS: ED Triage  Vitals  Enc Vitals Group     BP 08/29/18 2222 (!) 183/107     Pulse Rate 08/29/18 2222 88     Resp 08/29/18 2222 18     Temp 08/29/18 2222 98.5 F (36.9 C)     Temp Source 08/29/18 2222 Oral     SpO2 08/29/18 2222 99 %     Weight 08/29/18 2222 140 lb (63.5 kg)     Height 08/29/18 2222 5' 11.5" (1.816 m)     Head Circumference --      Peak Flow --      Pain Score 08/29/18 2226 0     Pain Loc --      Pain Edu? --      Excl. in [place]? --     Constitutional: Alert and oriented  x4 pleasant cooperative speaks in full clear sentences no diaphoresis Eyes: PERRL EOMI. midrange and brisk Head: Atraumatic. Nose: No congestion/rhinnorhea. Mouth/Throat: No trismus.  Uvula midline no pharyngeal erythema or exudate.  Very poor dentition with several loose of rotten teeth.  He does have some tenderness to left lower tooth to percussion although no obvious abscess No sublingual or submental induration or fullness. Neck: No stridor.   Cardiovascular: Normal rate, regular rhythm. Grossly normal heart sounds.  Good peripheral circulation. Respiratory: Normal respiratory effort.  No retractions. Lungs CTAB and moving good air Gastrointestinal: Soft nondistended nontender no rebound or guarding no peritonitis Musculoskeletal: No lower extremity edema   Neurologic:  Normal speech and language. No gross focal neurologic deficits are appreciated. Skin:  Skin is warm, dry and intact. No rash noted. Psychiatric: Mood and affect are normal. Speech and behavior are normal.    ____________________________________________   DIFFERENTIAL includes but not limited to  Dental infection, periapical abscess, Ludwig angina, otitis media, DKA, HHS, hypertensive emergency ____________________________________________   LABS (all labs ordered are listed, but only abnormal results are displayed)  Labs Reviewed  GLUCOSE, CAPILLARY - Abnormal; Notable for the following components:      Result Value   Glucose-Capillary 116 (*)    All other components within normal limits  COMPREHENSIVE METABOLIC PANEL - Abnormal; Notable for the following components:   Chloride 114 (*)    BUN 41 (*)    Creatinine, Ser 4.69 (*)    Calcium 7.4 (*)    Total Protein 5.2 (*)    Albumin 1.8 (*)    AST 56 (*)    ALT 51 (*)    GFR calc non Af Amer 13 (*)    GFR calc Af Amer 14 (*)    Anion gap 3 (*)    All other components within normal limits  CBC WITH DIFFERENTIAL/PLATELET - Abnormal; Notable for the  following components:   RBC 3.65 (*)    Hemoglobin 11.0 (*)    HCT 32.3 (*)    All other components within normal limits  GLUCOSE, CAPILLARY - Abnormal; Notable for the following components:   Glucose-Capillary 164 (*)    All other components within normal limits  CREATININE, URINE, RANDOM  SODIUM, URINE, RANDOM  TSH  HEMOGLOBIN A1C    Lab work reviewed by me with a number of very concerning abnormalities.  His creatinine today is 4.7 and his albumin is 1.8 both of which are new __________________________________________  EKG   ____________________________________________  RADIOLOGY   ____________________________________________   PROCEDURES  Procedure(s) performed: no  .Critical Care Performed by: Darel Hong, MD Authorized by: Darel Hong, MD   Critical care provider statement:  Critical care time (minutes):  30   Critical care time was exclusive of:  Separately billable procedures and treating other patients   Critical care was necessary to treat or prevent imminent or life-threatening deterioration of the following conditions:  Renal failure   Critical care was time spent personally by me on the following activities:  Development of treatment plan with patient or surrogate, discussions with consultants, evaluation of patient's response to treatment, examination of patient, obtaining history from patient or surrogate, ordering and performing treatments and interventions, ordering and review of laboratory studies, ordering and review of radiographic studies, pulse oximetry, re-evaluation of patient's condition and review of old charts    Critical Care performed: Yes  ____________________________________________   INITIAL IMPRESSION / ASSESSMENT AND PLAN / ED COURSE  Pertinent labs & imaging results that were available during my care of the patient were reviewed by me and considered in my medical decision making (see chart for details).   As part of my  medical decision making, I reviewed the following data within the [place] History obtained from family if available, nursing notes, old chart and ekg, as well as notes from prior ED visits.  The patient comes to the emergency department with left-sided facial swelling that has rapidly improved on its own.  He does have extremely poor dentition is some tenderness left lower molar and I think his swelling is likely related to a dental infection.  I noted that the patient's blood pressure was in the 180s today and when discussing his past medical history he told me he has not taken any medications for hypertension or diabetes in over 2 years.  This concern to me so I checked a CBC and a history to evaluate renal function prior to initiating any antihypertensives or medication for his diabetes.  Unfortunately his lab work is extremely abnormal with a creatinine of 4.7 and an albumin of 1.8.  I rechecked his blood pressure and it was 210/110 so I ordered 40 mg of IV labetalol to emergently bring it down as he clearly has endorgan damage likely from his hypertension.  At this point given the patient's acute hypertensive emergency he requires inpatient admission for aggressive management of his blood pressure and work-up of his renal failure as well as his hypoalbuminemia.  He has no obvious ascites on exam however I am concerned that his remote IV drug use could have caused occult hepatitis C and cirrhosis.  The patient verbalizes understanding and agreement with the plan.  I then discussed with the hospitalist Dr. Marcille Blanco who has graciously agreed to admit the patient to his service.      ____________________________________________   FINAL CLINICAL IMPRESSION(S) / ED DIAGNOSES  Final diagnoses:  Dental infection  Type 2 diabetes mellitus with other specified complication, without long-term current use of insulin ([place])  Hypertension, unspecified type  Hypoalbuminemia  Chronic kidney  disease, unspecified CKD stage  Hypertensive emergency      NEW MEDICATIONS STARTED DURING THIS VISIT:  Current Discharge Medication List    START taking these medications   Details  amLODipine (NORVASC) 5 MG tablet Take 1 tablet (5 mg total) by mouth daily. Qty: 30 tablet, Refills: 0    metFORMIN (GLUCOPHAGE) 1000 MG tablet Take 1 tablet (1,000 mg total) by mouth 2 (two) times daily. Qty: 60 tablet, Refills: 0    penicillin v potassium (VEETID) 500 MG tablet Take 1 tablet (500 mg total) by mouth 4 (four) times daily for 10 days.  Qty: 40 tablet, Refills: 0         Note:  This document was prepared using Dragon voice recognition software and may include unintentional dictation errors.     Darel Hong, MD 08/30/18 [phone]

## 2018-08-30 NOTE — ED Notes (Signed)
Patient to stat desk in no acute distress asking about wait time. Patient given an update. Patient verbalizes understanding.

## 2018-08-30 NOTE — Progress Notes (Signed)
[place] at [facility]                                                                                                                                                                                  Patient Demographics   Shaun Runyon, is a 57 y.o. male, DOB - [birth year], QPY:[id number]  Admit date - 08/30/2018   Admitting Physician Harrie Foreman, MD  Outpatient Primary MD for the patient is Patient, No Pcp Per   LOS - 0  Subjective: Patient admitted with acute kidney injury, seen by nephrology Complaints   Review of Systems:   CONSTITUTIONAL: No documented fever. No fatigue, weakness. No weight gain, no weight loss.  EYES: No blurry or double vision.  ENT: No tinnitus. No postnasal drip. No redness of the oropharynx.  RESPIRATORY: No cough, no wheeze, no hemoptysis. No dyspnea.  CARDIOVASCULAR: No chest pain. No orthopnea. No palpitations. No syncope.  GASTROINTESTINAL: No nausea, no vomiting or diarrhea. No abdominal pain. No melena or hematochezia.  GENITOURINARY: No dysuria or hematuria.  ENDOCRINE: No polyuria or nocturia. No heat or cold intolerance.  HEMATOLOGY: No anemia. No bruising. No bleeding.  INTEGUMENTARY: No rashes. No lesions.  MUSCULOSKELETAL: No arthritis. No swelling. No gout.  NEUROLOGIC: No numbness, tingling, or ataxia. No seizure-type activity.  PSYCHIATRIC: No anxiety. No insomnia. No ADD.    Vitals:   Vitals:   08/30/18 0530 08/30/18 0600 08/30/18 0630 08/30/18 1210  BP: (!) 198/108 (!) 180/102 (!) 195/131 135/89  Pulse: 73 71 71 80  Resp: (!) 21 18 18 16   Temp:   98.2 F (36.8 C) 98.3 F (36.8 C)  TempSrc:   Oral Oral  SpO2: 100% 100% 100% 100%  Weight:      Height:        Wt Readings from Last 3 Encounters:  08/29/18 63.5 kg     Intake/Output Summary (Last 24 hours) at 08/30/2018 1603 Last data filed at 08/30/2018 1300 [place] per 24 hour  Intake 600 ml  Output 200 ml  Net 400 ml    Physical  Exam:   GENERAL: Pleasant-appearing in no apparent distress.  HEAD, EYES, EARS, NOSE AND THROAT: Atraumatic, normocephalic. Extraocular muscles are intact. Pupils equal and reactive to light. Sclerae anicteric. No conjunctival injection. No oro-pharyngeal erythema.  NECK: Supple. There is no jugular venous distention. No bruits, no lymphadenopathy, no thyromegaly.  HEART: Regular rate and rhythm,. No murmurs, no rubs, no clicks.  LUNGS: Clear to auscultation bilaterally. No rales or rhonchi. No wheezes.  ABDOMEN: Soft, flat, nontender, nondistended. Has good bowel sounds. No hepatosplenomegaly appreciated.  EXTREMITIES: No evidence of any cyanosis, clubbing, or peripheral edema.  +  2 pedal and radial pulses bilaterally.  NEUROLOGIC: The patient is alert, awake, and oriented x3 with no focal motor or sensory deficits appreciated bilaterally.  SKIN: Moist and warm with no rashes appreciated.  Psych: Not anxious, depressed LN: No inguinal LN enlargement    Antibiotics   Anti-infectives (From admission, onward)   Start     Dose/Rate Route Frequency Ordered Stop   08/30/18 2200  amoxicillin-clavulanate (AUGMENTIN) 500-125 MG per tablet 500 mg     1 tablet Oral 2 times daily 08/30/18 1457     08/30/18 1000  amoxicillin (AMOXIL) capsule 500 mg  Status:  Discontinued     500 mg Oral Every 12 hours 08/30/18 0634 08/30/18 1457   08/30/18 0130  penicillin v potassium (VEETID) tablet 500 mg     500 mg Oral  Once 08/30/18 0128 08/30/18 0152   08/30/18 0000  penicillin v potassium (VEETID) 500 MG tablet     500 mg Oral 4 times daily 08/30/18 0130 09/09/18 2359      Medications   Scheduled Meds: . amLODipine  10 mg Oral Daily  . amoxicillin-clavulanate  1 tablet Oral BID  . docusate sodium  100 mg Oral BID  . heparin  5,000 Units Subcutaneous Q8H  . [START ON 08/31/2018] Influenza vac split quadrivalent PF  0.5 mL Intramuscular Tomorrow-1000  . insulin aspart  0-5 Units Subcutaneous QHS  .  insulin aspart  0-9 Units Subcutaneous TID WC  . nitroGLYCERIN  0.5 inch Topical Q6H  . [START ON 08/31/2018] pneumococcal 23 valent vaccine  0.5 mL Intramuscular Tomorrow-1000   Continuous Infusions: PRN Meds:.acetaminophen **OR** acetaminophen, labetalol, ondansetron **OR** ondansetron (ZOFRAN) IV   Data Review:   Micro Results No results found for this or any previous visit (from the past 240 hour(s)).  Radiology Reports No results found.   CBC Recent Labs  Lab 08/30/18 0128  WBC 7.3  HGB 11.0*  HCT 32.3*  PLT 227  MCV 88.5  MCH 30.1  MCHC 34.1  RDW 12.8  LYMPHSABS 1.8  MONOABS 0.5  EOSABS 0.1  BASOSABS 0.0    Chemistries  Recent Labs  Lab 08/30/18 0128  NA 140  K 3.8  CL 114*  CO2 23  GLUCOSE 98  BUN 41*  CREATININE 4.69*  CALCIUM 7.4*  AST 56*  ALT 51*  ALKPHOS 103  BILITOT 0.4   ------------------------------------------------------------------------------------------------------------------ estimated creatinine clearance is 15.4 mL/min (A) (by C-G formula based on SCr of 4.69 mg/dL (H)). ------------------------------------------------------------------------------------------------------------------ Recent Labs    08/30/18 0128  HGBA1C 5.5   ------------------------------------------------------------------------------------------------------------------ No results for input(s): CHOL, HDL, LDLCALC, TRIG, CHOLHDL, LDLDIRECT in the last 72 hours. ------------------------------------------------------------------------------------------------------------------ Recent Labs    08/30/18 0128  TSH 4.960*   ------------------------------------------------------------------------------------------------------------------ No results for input(s): VITAMINB12, FOLATE, FERRITIN, TIBC, IRON, RETICCTPCT in the last 72 hours.  Coagulation profile No results for input(s): INR, PROTIME in the last 168 hours.  No results for input(s): DDIMER in the last  72 hours.  Cardiac Enzymes No results for input(s): CKMB, TROPONINI, MYOGLOBIN in the last 168 hours.  Invalid input(s): CK ------------------------------------------------------------------------------------------------------------------ Invalid input(s): [place]   This is a 57 year old male admitted for acute kidney injury. 1.  Acute kidney injury: Baseline renal function not available.  The patient likely has a long-standing renal insufficiency secondary to uncontrolled diabetes and hypertension.  Hydrate with intravenous fluid.   Renal ultrasound pending appreciate nephrology input 2.  Hypertension: Continue amlodipine .  Labetalol as needed 3.  Diabetes mellitus type 2: Rating scale insulin while hospitalized.  Check hemoglobin A1c 4.  Dental abscess: Continue Augmentin 5.  DVT prophylaxis: Heparin 6.  GI prophylaxis: None Patient is a full code.  Time spent on admission orders and patient      Code Status Orders  (From admission, onward)         Start     Ordered   08/30/18 0630  Full code  Continuous     08/30/18 0629        Code Status History    This patient has a current code status but no historical code status.           Consults nephrology DVT Prophylaxis  heparin  Lab Results  Component Value Date   PLT 227 08/30/2018     Time Spent in minutes   43min spent 3pm to 345 pm  Greater than 50% of time spent in care coordination and counseling patient regarding the condition and plan of care.   Dustin Flock M.D on 08/30/2018 at 4:03 PM  Between 7am to 6pm - Pager - [phone]  After 6pm go to [email or web address] - Proofreader  [employer]   Office  [phone]

## 2018-08-30 NOTE — Progress Notes (Signed)
   08/30/18 1200  Clinical Encounter Type  Visited With Patient  Visit Type Initial;Spiritual support  Recommendations Follow-up as requested.  Spiritual Encounters  Spiritual Needs Emotional;Prayer  Stress Factors  Patient Stress Factors Other (Comment) (Patient has turned outcome over to God.)   Patient does not seem worried about his health changes. Patient spoke about his [employer] faith and how God is sustaining him. The patient is hopeful for improved kidney functions, but has spiritually/emotionally turned the matter over to God. Chaplain provided active listening and pastoral presence.

## 2018-08-30 NOTE — ED Notes (Signed)
ED TO INPATIENT HANDOFF REPORT  Name/Age/Gender Casey Franco 57 y.o. male  Code Status   Home/SNF/Other Home  Chief Complaint Swollen Neck  Level of Care/Admitting Diagnosis ED Disposition    ED Disposition Condition [facility] Area: [place] [[id number]]  Level of Care: Med-Surg [16]  Diagnosis: AKI (acute kidney injury) [facility]) [[id number]]  Admitting Physician: Harrie Foreman [[id number]]  Attending Physician: Harrie Foreman [[id number]]  Estimated length of stay: past midnight tomorrow  Certification:: I certify this patient will need inpatient services for at least 2 midnights  PT Class (Do Not Modify): Inpatient [101]  PT Acc Code (Do Not Modify): Private [1]       Medical History Past Medical History:  Diagnosis Date  . Diabetes mellitus without complication ([place])   . Hypertension     Allergies No Known Allergies  IV Location/Drains/Wounds Patient Lines/Drains/Airways Status   Active Line/Drains/Airways    Name:   Placement date:   Placement time:   Site:   Days:   Peripheral IV 08/30/18 Right Forearm   08/30/18    0440    Forearm   less than 1          Labs/Imaging Results for orders placed or performed during the hospital encounter of 08/30/18 (from the past 48 hour(s))  Glucose, capillary     Status: Abnormal   Collection Time: 08/29/18 10:34 PM  Result Value Ref Range   Glucose-Capillary 116 (H) 70 - 99 mg/dL  Comprehensive metabolic panel     Status: Abnormal   Collection Time: 08/30/18  1:28 AM  Result Value Ref Range   Sodium 140 135 - 145 mmol/L   Potassium 3.8 3.5 - 5.1 mmol/L   Chloride 114 (H) 98 - 111 mmol/L   CO2 23 22 - 32 mmol/L   Glucose, Bld 98 70 - 99 mg/dL   BUN 41 (H) 6 - 20 mg/dL   Creatinine, Ser 4.69 (H) 0.61 - 1.24 mg/dL   Calcium 7.4 (L) 8.9 - 10.3 mg/dL   Total Protein 5.2 (L) 6.5 - 8.1 g/dL   Albumin 1.8 (L) 3.5 - 5.0 g/dL   AST 56 (H) 15 - 41 U/L   ALT 51 (H) 0 - 44 U/L   Alkaline Phosphatase 103 38 - 126 U/L   Total Bilirubin 0.4 0.3 - 1.2 mg/dL   GFR calc non Af Amer 13 (L) >60 mL/min   GFR calc Af Amer 14 (L) >60 mL/min    Comment: (NOTE) The eGFR has been calculated using the CKD EPI equation. This calculation has not been validated in all clinical situations. eGFR's persistently <60 mL/min signify possible Chronic Kidney Disease.    Anion gap 3 (L) 5 - 15    Comment: Performed at [facility], [address] [place], [place] [address]  CBC with Differential     Status: Abnormal   Collection Time: 08/30/18  1:28 AM  Result Value Ref Range   WBC 7.3 4.0 - 10.5 K/uL   RBC 3.65 (L) 4.22 - 5.81 MIL/uL   Hemoglobin 11.0 (L) 13.0 - 17.0 g/dL   HCT 32.3 (L) 39.0 - 52.0 %   MCV 88.5 80.0 - 100.0 fL   MCH 30.1 26.0 - 34.0 pg   MCHC 34.1 30.0 - 36.0 g/dL   RDW 12.8 11.5 - 15.5 %   Platelets 227 150 - 400 K/uL   nRBC 0.0 0.0 - 0.2 %   Neutrophils Relative % 66 %  Neutro Abs 4.8 1.7 - 7.7 K/uL   Lymphocytes Relative 25 %   Lymphs Abs 1.8 0.7 - 4.0 K/uL   Monocytes Relative 7 %   Monocytes Absolute 0.5 0.1 - 1.0 K/uL   Eosinophils Relative 2 %   Eosinophils Absolute 0.1 0.0 - 0.5 K/uL   Basophils Relative 0 %   Basophils Absolute 0.0 0.0 - 0.1 K/uL   Immature Granulocytes 0 %   Abs Immature Granulocytes 0.03 0.00 - 0.07 K/uL    Comment: Performed at [facility], [address] [place], [place] [address]   No results found.  Pending Labs [employer] (From admission, onward)    Start     Ordered   Signed and Held  TSH  Add-on,   R     Signed and Held   Signed and Held  Hemoglobin A1c  Add-on,   R     Signed and Held   Signed and Held  Creatinine, urine, random  Once,   STAT     Signed and Held   Signed and Held  Sodium, urine, random  ONCE - STAT,   STAT     Signed and Held          Vitals/Pain Today's Vitals   08/30/18 0446 08/30/18 0454 08/30/18 0500 08/30/18 0530  BP: (!) 213/120 (!) 183/110 (!) 172/109  (!) 198/108  Pulse:  72 73 73  Resp:  16 13 (!) 21  Temp:      TempSrc:      SpO2:  100% 100% 100%  Weight:      Height:      PainSc:        Isolation Precautions No active isolations  Medications Medications  nitroGLYCERIN (NITROGLYN) 2 % ointment 0.5 inch (0.5 inches Topical Given 08/30/18 0535)  penicillin v potassium (VEETID) tablet 500 mg (500 mg Oral Given 08/30/18 0152)  labetalol (NORMODYNE,TRANDATE) injection 40 mg (40 mg Intravenous Given 08/30/18 0442)

## 2018-08-31 ENCOUNTER — Inpatient Hospital Stay: Payer: BLUE CROSS/BLUE SHIELD

## 2018-08-31 LAB — RENAL FUNCTION PANEL
Albumin: 1.4 g/dL — ABNORMAL LOW (ref 3.5–5.0)
Anion gap: 7 (ref 5–15)
BUN: 40 mg/dL — ABNORMAL HIGH (ref 6–20)
CO2: 21 mmol/L — ABNORMAL LOW (ref 22–32)
Calcium: 7.2 mg/dL — ABNORMAL LOW (ref 8.9–10.3)
Chloride: 111 mmol/L (ref 98–111)
Creatinine, Ser: 4.61 mg/dL — ABNORMAL HIGH (ref 0.61–1.24)
GFR calc Af Amer: 15 mL/min — ABNORMAL LOW
GFR calc non Af Amer: 13 mL/min — ABNORMAL LOW
Glucose, Bld: 98 mg/dL (ref 70–99)
Phosphorus: 4 mg/dL (ref 2.5–4.6)
Potassium: 3.9 mmol/L (ref 3.5–5.1)
Sodium: 139 mmol/L (ref 135–145)

## 2018-08-31 LAB — GLUCOSE, CAPILLARY
Glucose-Capillary: 84 mg/dL (ref 70–99)
Glucose-Capillary: 152 mg/dL — ABNORMAL HIGH (ref 70–99)
Glucose-Capillary: 118 mg/dL — ABNORMAL HIGH (ref 70–99)
Glucose-Capillary: 112 mg/dL — ABNORMAL HIGH (ref 70–99)
Glucose-Capillary: 147 mg/dL — ABNORMAL HIGH (ref 70–99)

## 2018-08-31 LAB — PROTEIN ELECTROPHORESIS, SERUM
A/G RATIO SPE: 0.8 (ref 0.7–1.7)
ALBUMIN ELP: 1.8 g/dL — AB (ref 2.9–4.4)
Alpha-1-Globulin: 0.2 g/dL (ref 0.0–0.4)
Alpha-2-Globulin: 0.8 g/dL (ref 0.4–1.0)
BETA GLOBULIN: 0.8 g/dL (ref 0.7–1.3)
Gamma Globulin: 0.6 g/dL (ref 0.4–1.8)
Globulin, Total: 2.4 g/dL (ref 2.2–3.9)
Total Protein ELP: 4.2 g/dL — ABNORMAL LOW (ref 6.0–8.5)

## 2018-08-31 LAB — HEPATITIS C ANTIBODY: HCV Ab: >11 s/co ratio — ABNORMAL HIGH (ref 0.0–0.9)

## 2018-08-31 LAB — C3 COMPLEMENT: C3 Complement: 88 mg/dL (ref 82–167)

## 2018-08-31 LAB — MPO/PR-3 (ANCA) ANTIBODIES
ANCA Proteinase 3: <3.5 U/mL (ref 0.0–3.5)
Myeloperoxidase Abs: <9 U/mL (ref 0.0–9.0)

## 2018-08-31 LAB — GLOMERULAR BASEMENT MEMBRANE ANTIBODIES: GBM AB: 2 U (ref 0–20)

## 2018-08-31 LAB — HEPATITIS B SURFACE ANTIBODY,QUALITATIVE: Hep B S Ab: NONREACTIVE

## 2018-08-31 LAB — HEPATITIS B CORE ANTIBODY, IGM: Hep B C IgM: NEGATIVE

## 2018-08-31 LAB — ANA W/REFLEX: ANA: NEGATIVE

## 2018-08-31 LAB — PARATHYROID HORMONE, INTACT (NO CA): PTH: 31 pg/mL (ref 15–65)

## 2018-08-31 LAB — HIV ANTIBODY (ROUTINE TESTING W REFLEX): HIV SCREEN 4TH GENERATION: NONREACTIVE

## 2018-08-31 LAB — C4 COMPLEMENT: Complement C4, Body Fluid: 33 mg/dL (ref 14–44)

## 2018-08-31 LAB — HEPATITIS B SURFACE ANTIGEN: Hepatitis B Surface Ag: NEGATIVE

## 2018-08-31 NOTE — Progress Notes (Addendum)
Inpatient Diabetes Program Recommendations  AACE/ADA: New Consensus Statement on Inpatient Glycemic Control (2015)  Target Ranges:  Prepandial:   less than 140 mg/dL      Peak postprandial:   less than 180 mg/dL (1-2 hours)      Critically ill patients:  140 - 180 mg/dL   Lab Results  Component Value Date   GLUCAP 152 (H) 08/31/2018   HGBA1C 5.5 08/30/2018    Review of Glycemic ControlResults for Casey Franco, Casey Franco (MRN [id number]) as of 08/31/2018 13:37  Ref. Range 08/30/2018 16:32 08/30/2018 19:27 08/30/2018 21:49 08/31/2018 07:35 08/31/2018 11:31  Glucose-Capillary Latest Ref Range: 70 - 99 mg/dL 125 (H) 114 (H) 130 (H) 84 152 (H)    Diabetes history: DM2 Outpatient Diabetes medications: None Current orders for Inpatient glycemic control: Novolog sensitive tid with meals and HS Inpatient Diabetes Program Recommendations:   Referral received.  Blood sugars well controlled.  No recommendations. A1C=5.5% indicating that patient is doing well with DM.  Thanks,  Adah Perl, RN, BC-ADM Inpatient Diabetes Coordinator Pager [phone] (8a-5p)

## 2018-08-31 NOTE — Progress Notes (Signed)
[place] at [facility]                                                                                                                                                                                  Patient Demographics   Casey Franco, is a 57 y.o. male, DOB - Feb 20, 1961, EGB:[id number]  Admit date - 08/30/2018   Admitting Physician Harrie Foreman, MD  Outpatient Primary MD for the patient is Patient, No Pcp Per   LOS - 1  Subjective: Patient currently denies any symptoms feeling better  Review of Systems:   CONSTITUTIONAL: No documented fever. No fatigue, weakness. No weight gain, no weight loss.  EYES: No blurry or double vision.  ENT: No tinnitus. No postnasal drip. No redness of the oropharynx.  RESPIRATORY: No cough, no wheeze, no hemoptysis. No dyspnea.  CARDIOVASCULAR: No chest pain. No orthopnea. No palpitations. No syncope.  GASTROINTESTINAL: No nausea, no vomiting or diarrhea. No abdominal pain. No melena or hematochezia.  GENITOURINARY: No dysuria or hematuria.  ENDOCRINE: No polyuria or nocturia. No heat or cold intolerance.  HEMATOLOGY: No anemia. No bruising. No bleeding.  INTEGUMENTARY: No rashes. No lesions.  MUSCULOSKELETAL: No arthritis. No swelling. No gout.  NEUROLOGIC: No numbness, tingling, or ataxia. No seizure-type activity.  PSYCHIATRIC: No anxiety. No insomnia. No ADD.    Vitals:   Vitals:   08/30/18 2048 08/31/18 0500 08/31/18 0521 08/31/18 1150  BP: (!) 149/96  139/89 (!) 159/96  Pulse: 79  78 82  Resp: 18  20 16   Temp: 98.6 F (37 C)  98.2 F (36.8 C) 98 F (36.7 C)  TempSrc: Oral  Oral Oral  SpO2: 100%  100% 100%  Weight:  67.5 kg    Height:        Wt Readings from Last 3 Encounters:  08/31/18 67.5 kg     Intake/Output Summary (Last 24 hours) at 08/31/2018 1416 Last data filed at 08/31/2018 0900 Gross per 24 hour  Intake 240 ml  Output 300 ml  Net -60 ml    Physical Exam:   GENERAL:  Pleasant-appearing in no apparent distress.  HEAD, EYES, EARS, NOSE AND THROAT: Atraumatic, normocephalic. Extraocular muscles are intact. Pupils equal and reactive to light. Sclerae anicteric. No conjunctival injection. No oro-pharyngeal erythema.  NECK: Supple. There is no jugular venous distention. No bruits, no lymphadenopathy, no thyromegaly.  HEART: Regular rate and rhythm,. No murmurs, no rubs, no clicks.  LUNGS: Clear to auscultation bilaterally. No rales or rhonchi. No wheezes.  ABDOMEN: Soft, flat, nontender, nondistended. Has good bowel sounds. No hepatosplenomegaly appreciated.  EXTREMITIES: No evidence of any cyanosis, clubbing, or peripheral edema.  +2  pedal and radial pulses bilaterally.  NEUROLOGIC: The patient is alert, awake, and oriented x3 with no focal motor or sensory deficits appreciated bilaterally.  SKIN: Moist and warm with no rashes appreciated.  Psych: Not anxious, depressed LN: No inguinal LN enlargement    Antibiotics   Anti-infectives (From admission, onward)   Start     Dose/Rate Route Frequency Ordered Stop   08/30/18 2200  amoxicillin-clavulanate (AUGMENTIN) 500-125 MG per tablet 500 mg     1 tablet Oral 2 times daily 08/30/18 1457     08/30/18 1000  amoxicillin (AMOXIL) capsule 500 mg  Status:  Discontinued     500 mg Oral Every 12 hours 08/30/18 0634 08/30/18 1457   08/30/18 0130  penicillin v potassium (VEETID) tablet 500 mg     500 mg Oral  Once 08/30/18 0128 08/30/18 0152   08/30/18 0000  penicillin v potassium (VEETID) 500 MG tablet     500 mg Oral 4 times daily 08/30/18 0130 09/09/18 2359      Medications   Scheduled Meds: . amLODipine  10 mg Oral Daily  . amoxicillin-clavulanate  1 tablet Oral BID  . docusate sodium  100 mg Oral BID  . heparin  5,000 Units Subcutaneous Q8H  . Influenza vac split quadrivalent PF  0.5 mL Intramuscular Tomorrow-1000  . insulin aspart  0-5 Units Subcutaneous QHS  . insulin aspart  0-9 Units Subcutaneous TID  WC  . nitroGLYCERIN  0.5 inch Topical Q6H  . pneumococcal 23 valent vaccine  0.5 mL Intramuscular Tomorrow-1000   Continuous Infusions: PRN Meds:.acetaminophen **OR** acetaminophen, labetalol, ondansetron **OR** ondansetron (ZOFRAN) IV   Data Review:   Micro Results No results found for this or any previous visit (from the past 240 hour(s)).  Radiology Reports US Renal  Result Date: 08/31/2018 CLINICAL DATA:  Acute renal failure. EXAM: RENAL / URINARY TRACT ULTRASOUND COMPLETE COMPARISON:  None. FINDINGS: Right Kidney: Renal measurements: 10.0 x 6.2 x 6.1 cm = volume: 196 mL. Increased parenchymal echogenicity. No mass or hydronephrosis identified. Small volume perinephric fluid. Left Kidney: Renal measurements: 10.0 x 4.5 x 4.8 cm = volume: 113 mL. Increased parenchymal echogenicity. No mass or hydronephrosis identified. Bladder: Appears normal for degree of bladder distention. IMPRESSION: 1. Echogenic kidneys suggesting medical renal disease. No hydronephrosis. 2. Small volume right perinephric fluid. Electronically Signed   By: Logan Bores M.D.   On: 08/31/2018 10:02     CBC Recent Labs  Lab 08/30/18 0128  WBC 7.3  HGB 11.0*  HCT 32.3*  PLT 227  MCV 88.5  MCH 30.1  MCHC 34.1  RDW 12.8  LYMPHSABS 1.8  MONOABS 0.5  EOSABS 0.1  BASOSABS 0.0    Chemistries  Recent Labs  Lab 08/30/18 0128 08/31/18 0445  NA 140 139  K 3.8 3.9  CL 114* 111  CO2 23 21*  GLUCOSE 98 98  BUN 41* 40*  CREATININE 4.69* 4.61*  CALCIUM 7.4* 7.2*  AST 56*  --   ALT 51*  --   ALKPHOS 103  --   BILITOT 0.4  --    ------------------------------------------------------------------------------------------------------------------ estimated creatinine clearance is 16.7 mL/min (A) (by C-G formula based on SCr of 4.61 mg/dL (H)). ------------------------------------------------------------------------------------------------------------------ Recent Labs    08/30/18 0128  HGBA1C 5.5    ------------------------------------------------------------------------------------------------------------------ No results for input(s): CHOL, HDL, LDLCALC, TRIG, CHOLHDL, LDLDIRECT in the last 72 hours. ------------------------------------------------------------------------------------------------------------------ Recent Labs    08/30/18 0128  TSH 4.960*   ------------------------------------------------------------------------------------------------------------------ No results for input(s): VITAMINB12, FOLATE, FERRITIN, TIBC,  IRON, RETICCTPCT in the last 72 hours.  Coagulation profile No results for input(s): INR, PROTIME in the last 168 hours.  No results for input(s): DDIMER in the last 72 hours.  Cardiac Enzymes No results for input(s): CKMB, TROPONINI, MYOGLOBIN in the last 168 hours.  Invalid input(s): CK ------------------------------------------------------------------------------------------------------------------ Invalid input(s): [place]   This is a 57 year old male admitted for acute kidney injury. 1.  Acute kidney injury: Baseline renal function not available.  The patient likely has a long-standing renal insufficiency secondary to uncontrolled diabetes and hypertension.  Work-up currently pending No significant change in renal function  2.  Hypertension: Continue amlodipine .  Labetalol as needed  3.  Diabetes mellitus type 2: Rating scale insulin while hospitalized.    Hemoglobin A1c normal   4.  Dental abscess: Continue Augmentin  5.  DVT prophylaxis: Heparin  6.  GI prophylaxis: None Patient is a full code.  Time spent on admission orders and patient      Code Status Orders  (From admission, onward)         Start     Ordered   08/30/18 0630  Full code  Continuous     08/30/18 0629        Code Status History    This patient has a current code status but no historical code status.           Consults  nephrology DVT Prophylaxis  heparin  Lab Results  Component Value Date   PLT 227 08/30/2018     Time Spent in minutes   56min  Greater than 50% of time spent in care coordination and counseling patient regarding the condition and plan of care.   Dustin Flock M.D on 08/31/2018 at 2:16 PM  Between 7am to 6pm - Pager - [phone]  After 6pm go to [email or web address] - Proofreader  [employer]   Office  [phone]

## 2018-08-31 NOTE — Progress Notes (Signed)
[facility]  ROUNDING NOTE   Subjective:   Patient with no complaints. Denies any more facial edema. Eating well. Afebrile.  Objective:  Vital signs in last 24 hours:  Temp:  [98 F (36.7 C)-98.6 F (37 C)] 98 F (36.7 C) (11/19 1150) Pulse Rate:  [76-82] 82 (11/19 1150) Resp:  [16-20] 16 (11/19 1150) BP: (139-159)/(89-96) 159/96 (11/19 1150) SpO2:  [100 %] 100 % (11/19 1150) Weight:  [67.5 kg] 67.5 kg (11/19 0500)  Weight change: 3.996 kg Filed Weights   08/29/18 2222 08/31/18 0500  Weight: 63.5 kg 67.5 kg    Intake/Output: I/O last 3 completed shifts: In: 600 [P.O.:600] Out: 500 [Urine:500]   Intake/Output this shift:  Total I/O In: 240 [P.O.:240] Out: 0   Physical Exam: General: NAD,   Head: Normocephalic, atraumatic. Moist oral mucosal membranes  Eyes: Anicteric, PERRL  Neck: Supple, trachea midline  Lungs:  Clear to auscultation  Heart: Regular rate and rhythm  Abdomen:  Soft, nontender,   Extremities:  no peripheral edema.  Neurologic: Nonfocal, moving all four extremities  Skin: No lesions  Access: none    Basic Metabolic Panel: Recent Labs  Lab 08/30/18 0128 08/30/18 1520 08/31/18 0445  NA 140  --  139  K 3.8  --  3.9  CL 114*  --  111  CO2 23  --  21*  GLUCOSE 98  --  98  BUN 41*  --  40*  CREATININE 4.69*  --  4.61*  CALCIUM 7.4*  --  7.2*  PHOS  --  4.0 4.0    Liver Function Tests: Recent Labs  Lab 08/30/18 0128 08/31/18 0445  AST 56*  --   ALT 51*  --   ALKPHOS 103  --   BILITOT 0.4  --   PROT 5.2*  --   ALBUMIN 1.8* 1.4*   No results for input(s): LIPASE, AMYLASE in the last 168 hours. No results for input(s): AMMONIA in the last 168 hours.  CBC: Recent Labs  Lab 08/30/18 0128  WBC 7.3  NEUTROABS 4.8  HGB 11.0*  HCT 32.3*  MCV 88.5  PLT 227    Cardiac Enzymes: No results for input(s): CKTOTAL, CKMB, CKMBINDEX, TROPONINI in the last 168 hours.  BNP: Invalid input(s): POCBNP  CBG: Recent Labs   Lab 08/30/18 1632 08/30/18 1927 08/30/18 2149 08/31/18 0735 08/31/18 1131  GLUCAP 125* 114* 130* 84 152*    Microbiology: No results found for this or any previous visit.  Coagulation Studies: No results for input(s): LABPROT, INR in the last 72 hours.  Urinalysis: Recent Labs    08/30/18 1810  COLORURINE YELLOW*  LABSPEC 1.012  PHURINE 6.0  GLUCOSEU 150*  HGBUR NEGATIVE  BILIRUBINUR NEGATIVE  KETONESUR NEGATIVE  PROTEINUR >=300*  NITRITE NEGATIVE  LEUKOCYTESUR NEGATIVE      Imaging: US Renal  Result Date: 08/31/2018 CLINICAL DATA:  Acute renal failure. EXAM: RENAL / URINARY TRACT ULTRASOUND COMPLETE COMPARISON:  None. FINDINGS: Right Kidney: Renal measurements: 10.0 x 6.2 x 6.1 cm = volume: 196 mL. Increased parenchymal echogenicity. No mass or hydronephrosis identified. Small volume perinephric fluid. Left Kidney: Renal measurements: 10.0 x 4.5 x 4.8 cm = volume: 113 mL. Increased parenchymal echogenicity. No mass or hydronephrosis identified. Bladder: Appears normal for degree of bladder distention. IMPRESSION: 1. Echogenic kidneys suggesting medical renal disease. No hydronephrosis. 2. Small volume right perinephric fluid. Electronically Signed   By: Logan Bores M.D.   On: 08/31/2018 10:02     Medications:    .  amLODipine  10 mg Oral Daily  . amoxicillin-clavulanate  1 tablet Oral BID  . docusate sodium  100 mg Oral BID  . heparin  5,000 Units Subcutaneous Q8H  . Influenza vac split quadrivalent PF  0.5 mL Intramuscular Tomorrow-1000  . insulin aspart  0-5 Units Subcutaneous QHS  . insulin aspart  0-9 Units Subcutaneous TID WC  . nitroGLYCERIN  0.5 inch Topical Q6H  . pneumococcal 23 valent vaccine  0.5 mL Intramuscular Tomorrow-1000   acetaminophen **OR** acetaminophen, labetalol, ondansetron **OR** ondansetron (ZOFRAN) IV  Assessment/ Plan:  Mr. TALLIS SOLEDAD is a 58 y.o. black male with Mr. OSIAS RESNICK is a 57 y.o. black male with diabetes  mellitus type II, hypertension, who was admitted to [facility] on 08/30/2018 for dental infection  1. Acute renal failure on chronic kidney disease NOS versus progression of chronic kidney disease to stage V.  11 grams of proteinuria. Nephrotic range.  Last creatinine available is 1.3, normal GFR on 07/10/2014.  No acute indication for dialysis. Dialysis education provided.  Renal ultrasound reviewed with patient.  Serum complements are normal.  Pending SPEP/UPEP, ANA, ANCA, anti-GBM, HIV, viral hepatitis panel At this time, a renal biopsy would not be clinically appropriate.   2. Hypertension: elevated. Started on amlodipine.   3. Anemia of chronic kidney disease: hemoglobin 11  4. Hypocalcemia: corrected calcium at goal. Phosphorus at goal. Pending PTH level.    LOS: 1 Bryonna Sundby 11/19/20191:06 PM

## 2018-09-01 LAB — RENAL FUNCTION PANEL
ALBUMIN: 1.4 g/dL — AB (ref 3.5–5.0)
ANION GAP: 4 — AB (ref 5–15)
BUN: 42 mg/dL — ABNORMAL HIGH (ref 6–20)
CHLORIDE: 114 mmol/L — AB (ref 98–111)
CO2: 22 mmol/L (ref 22–32)
CREATININE: 4.47 mg/dL — AB (ref 0.61–1.24)
Calcium: 7.1 mg/dL — ABNORMAL LOW (ref 8.9–10.3)
GFR calc Af Amer: 15 mL/min — ABNORMAL LOW (ref >60)
GFR calc non Af Amer: 13 mL/min — ABNORMAL LOW (ref >60)
Glucose, Bld: 112 mg/dL — ABNORMAL HIGH (ref 70–99)
POTASSIUM: 4.1 mmol/L (ref 3.5–5.1)
Phosphorus: 4.4 mg/dL (ref 2.5–4.6)
SODIUM: 140 mmol/L (ref 135–145)

## 2018-09-01 LAB — KAPPA/LAMBDA LIGHT CHAINS
KAPPA FREE LGHT CHN: 176 mg/L — AB (ref 3.3–19.4)
Kappa, lambda light chain ratio: 1.74 — ABNORMAL HIGH (ref 0.26–1.65)
LAMDA FREE LIGHT CHAINS: 101.2 mg/L — AB (ref 5.7–26.3)

## 2018-09-01 LAB — GLUCOSE, CAPILLARY
Glucose-Capillary: 84 mg/dL (ref 70–99)
Glucose-Capillary: 123 mg/dL — ABNORMAL HIGH (ref 70–99)

## 2018-09-01 MED ORDER — AMLODIPINE BESYLATE 10 MG PO TABS: 10.0000 mg | ORAL_TABLET | Freq: Every day | ORAL | 0 refills | Status: DC

## 2018-09-01 MED ORDER — BLOOD GLUCOSE MONITOR KIT: PACK | 0 refills | Status: DC

## 2018-09-01 MED ORDER — AMOXICILLIN-POT CLAVULANATE 500-125 MG PO TABS: 1.0000 | ORAL_TABLET | Freq: Two times a day (BID) | ORAL | 0 refills | Status: DC

## 2018-09-01 MED ORDER — AMOXICILLIN-POT CLAVULANATE 500-125 MG PO TABS: 1.0000 | ORAL_TABLET | Freq: Two times a day (BID) | ORAL | 0 refills | Status: AC

## 2018-09-01 NOTE — Discharge Summary (Signed)
[place] at [facility], 57 y.o., DOB July 28, 1961, MRN [id number]. Admission date: 08/30/2018 Discharge Date 09/01/2018 Primary MD Patient, No Pcp Per Admitting Physician Harrie Foreman, MD  Admission Diagnosis  Hypoalbuminemia [E88.09] Dental infection [K04.7] Hypertensive emergency [I16.1] Type 2 diabetes mellitus with other specified complication, without long-term current use of insulin ([place]) [E11.69] Chronic kidney disease, unspecified CKD stage [N18.9] Hypertension, unspecified type [I10]  Discharge Diagnosis   Active Problems: Acute renal failure on chronic kidney disease Otherwise nonspecified versus progression of chronic kidney disease stage IV  Essential hypertension  Anemia of chronic disease  Diabetes type 2  Hypocalcemia  Possible dental infection  Hospital Course  The patient with past medical history of hypertension and diabetes presents to the emergency department concerned about swelling of his lower jaw.  The patient denies pain or fever but admits to fullness and tenderness of his lower jaw bilaterally.  He has had issues with his dentition before but does not recall when his mouth began to swell with this encounter.  Patient was thought to have a possible dental infection in the ED he also had blood work checked which showed elevated creatinine.  Patient had not seen a doctor in more than 2 years and had not blood work in multiple years.  His creatinine was noted to be elevated there was concern that this could be acute he was seen by nephrology and felt likely has chronic kidney disease with some progression.  He will need close outpatient follow-up with nephrology.  In terms of his diabetes patient is not taking any medications hemoglobin A1c is 5.5 so at this point I just recommended he keep an eye on his blood sugar.  He is also set up to be seen in a clinic for outpatient follow-up.             Consults   nephrology  Significant Tests:  See full reports for all details     US Renal  Result Date: 08/31/2018 CLINICAL DATA:  Acute renal failure. EXAM: RENAL / URINARY TRACT ULTRASOUND COMPLETE COMPARISON:  None. FINDINGS: Right Kidney: Renal measurements: 10.0 x 6.2 x 6.1 cm = volume: 196 mL. Increased parenchymal echogenicity. No mass or hydronephrosis identified. Small volume perinephric fluid. Left Kidney: Renal measurements: 10.0 x 4.5 x 4.8 cm = volume: 113 mL. Increased parenchymal echogenicity. No mass or hydronephrosis identified. Bladder: Appears normal for degree of bladder distention. IMPRESSION: 1. Echogenic kidneys suggesting medical renal disease. No hydronephrosis. 2. Small volume right perinephric fluid. Electronically Signed   By: Logan Bores M.D.   On: 08/31/2018 10:02       Today   Subjective:   Marta Lamas patient feeling well denies any complaints Objective:   Blood pressure (!) 180/97, pulse 84, temperature 98.2 F (36.8 C), resp. rate (!) 22, height 5' 11.5" (1.816 m), weight 68.7 kg, SpO2 100 %.  .  Intake/Output Summary (Last 24 hours) at 09/01/2018 1436 Last data filed at 09/01/2018 1015 Gross per 24 hour  Intake 720 ml  Output 0 ml  Net 720 ml    Exam VITAL SIGNS: Blood pressure (!) 180/97, pulse 84, temperature 98.2 F (36.8 C), resp. rate (!) 22, height 5' 11.5" (1.816 m), weight 68.7 kg, SpO2 100 %.  GENERAL:  57 y.o.-year-old patient lying in the bed with no acute distress.  EYES: Pupils equal, round, reactive to light and accommodation. No scleral icterus. Extraocular muscles intact.  HEENT: Head atraumatic, normocephalic.  Oropharynx and nasopharynx clear.  NECK:  Supple, no jugular venous distention. No thyroid enlargement, no tenderness.  LUNGS: Normal breath sounds bilaterally, no wheezing, rales,rhonchi or crepitation. No use of accessory muscles of respiration.  CARDIOVASCULAR: S1, S2 normal. No murmurs, rubs, or gallops.  ABDOMEN: Soft,  nontender, nondistended. Bowel sounds present. No organomegaly or mass.  EXTREMITIES: No pedal edema, cyanosis, or clubbing.  NEUROLOGIC: Cranial nerves II through XII are intact. Muscle strength 5/5 in all extremities. Sensation intact. Gait not checked.  PSYCHIATRIC: The patient is alert and oriented x 3.  SKIN: No obvious rash, lesion, or ulcer.   Data Review     CBC w Diff:  Lab Results  Component Value Date   WBC 7.3 08/30/2018   HGB 11.0 (L) 08/30/2018   HCT 32.3 (L) 08/30/2018   PLT 227 08/30/2018   LYMPHOPCT 25 08/30/2018   MONOPCT 7 08/30/2018   EOSPCT 2 08/30/2018   BASOPCT 0 08/30/2018   CMP:  Lab Results  Component Value Date   NA 140 09/01/2018   K 4.1 09/01/2018   CL 114 (H) 09/01/2018   CO2 22 09/01/2018   BUN 42 (H) 09/01/2018   CREATININE 4.47 (H) 09/01/2018   PROT 5.2 (L) 08/30/2018   ALBUMIN 1.4 (L) 09/01/2018   BILITOT 0.4 08/30/2018   ALKPHOS 103 08/30/2018   AST 56 (H) 08/30/2018   ALT 51 (H) 08/30/2018  .  Micro Results No results found for this or any previous visit (from the past 240 hour(s)).      Code Status Orders  (From admission, onward)         Start     Ordered   08/30/18 0630  Full code  Continuous     08/30/18 0629        Code Status History    This patient has a current code status but no historical code status.          Follow-up Information    Jodi Marble, MD. Go on 09/14/2018.   Specialty:  Internal Medicine Why:  @11 :30a PLEASE ARRIVE @11AM  Contact information: [place] [address] [phone]        Lavonia Dana, MD. Go on 09/22/2018.   Specialty:  Internal Medicine Why:  @2PM  Contact information: [address] [place]  [address] [phone]           Discharge Medications   Allergies as of 09/01/2018   No Known Allergies     Medication List    TAKE these medications   amLODipine 10 MG tablet Commonly known as:  NORVASC Take 1  tablet (10 mg total) by mouth daily.   amoxicillin-clavulanate 500-125 MG tablet Commonly known as:  AUGMENTIN Take 1 tablet (500 mg total) by mouth 2 (two) times daily for 5 days.   blood glucose meter kit and supplies Kit Dispense based on patient and insurance preference. Use up to four times daily as directed. (FOR ICD-9 250.00, 250.01).          Total Time in preparing paper work, data evaluation and todays exam - 39 minutes  Dustin Flock M.D on 09/01/2018 at 2:36 PM [place]  [phone]

## 2018-09-01 NOTE — Progress Notes (Signed)
Casey Franco to be D/C'd home per MD order.  Discussed prescriptions and follow up appointments with the patient. Prescriptions given to patient, medication list explained in detail. Pt verbalized understanding.  Allergies as of 09/01/2018   No Known Allergies     Medication List    TAKE these medications   amLODipine 10 MG tablet Commonly known as:  NORVASC Take 1 tablet (10 mg total) by mouth daily.   amoxicillin-clavulanate 500-125 MG tablet Commonly known as:  AUGMENTIN Take 1 tablet (500 mg total) by mouth 2 (two) times daily for 5 days.   blood glucose meter kit and supplies Kit Dispense based on patient and insurance preference. Use up to four times daily as directed. (FOR ICD-9 250.00, 250.01).       Vitals:   09/01/18 0519 09/01/18 1127  BP: (!) 141/88 (!) 180/97  Pulse: 82 84  Resp: (!) 22   Temp: 98.3 F (36.8 C) 98.2 F (36.8 C)  SpO2: 100% 100%    Skin clean, dry and intact without evidence of skin break down, no evidence of skin tears noted. IV catheter discontinued intact. Site without signs and symptoms of complications. Dressing and pressure applied. Pt denies pain at this time. No complaints noted.  An After Visit Summary was printed and given to the patient. Patient escorted via [place], and D/C home via private auto.  Casey Franco A Casey Franco

## 2018-09-01 NOTE — Progress Notes (Signed)
[facility]  ROUNDING NOTE   Subjective:   Patient is scheduled for discharge later today.   Objective:  Vital signs in last 24 hours:  Temp:  [98.2 F (36.8 C)-98.8 F (37.1 C)] 98.2 F (36.8 C) (11/20 1127) Pulse Rate:  [82-89] 84 (11/20 1127) Resp:  [20-22] 22 (11/20 0519) BP: (141-180)/(88-97) 180/97 (11/20 1127) SpO2:  [100 %] 100 % (11/20 1127) Weight:  [68.7 kg] 68.7 kg (11/20 0521)  Weight change: 1.2 kg Filed Weights   08/29/18 2222 08/31/18 0500 09/01/18 0521  Weight: 63.5 kg 67.5 kg 68.7 kg    Intake/Output: I/O last 3 completed shifts: In: 66 [P.O.:720] Out: 300 [Urine:300]   Intake/Output this shift:  Total I/O In: 480 [P.O.:480] Out: -   Physical Exam: General: NAD,   Head: Normocephalic, atraumatic. Moist oral mucosal membranes  Eyes: Anicteric, PERRL  Neck: Supple, trachea midline  Lungs:  Clear to auscultation  Heart: Regular rate and rhythm  Abdomen:  Soft, nontender,   Extremities:  no peripheral edema.  Neurologic: Nonfocal, moving all four extremities  Skin: No lesions  Access: none    Basic Metabolic Panel: Recent Labs  Lab 08/30/18 0128 08/30/18 1520 08/31/18 0445 09/01/18 0412  NA 140  --  139 140  K 3.8  --  3.9 4.1  CL 114*  --  111 114*  CO2 23  --  21* 22  GLUCOSE 98  --  98 112*  BUN 41*  --  40* 42*  CREATININE 4.69*  --  4.61* 4.47*  CALCIUM 7.4*  --  7.2* 7.1*  PHOS  --  4.0 4.0 4.4    Liver Function Tests: Recent Labs  Lab 08/30/18 0128 08/31/18 0445 09/01/18 0412  AST 56*  --   --   ALT 51*  --   --   ALKPHOS 103  --   --   BILITOT 0.4  --   --   PROT 5.2*  --   --   ALBUMIN 1.8* 1.4* 1.4*   No results for input(s): LIPASE, AMYLASE in the last 168 hours. No results for input(s): AMMONIA in the last 168 hours.  CBC: Recent Labs  Lab 08/30/18 0128  WBC 7.3  NEUTROABS 4.8  HGB 11.0*  HCT 32.3*  MCV 88.5  PLT 227    Cardiac Enzymes: No results for input(s): CKTOTAL, CKMB,  CKMBINDEX, TROPONINI in the last 168 hours.  BNP: Invalid input(s): POCBNP  CBG: Recent Labs  Lab 08/31/18 1642 08/31/18 1746 08/31/18 2110 09/01/18 0723 09/01/18 1126  GLUCAP 118* 112* 147* 84 123*    Microbiology: No results found for this or any previous visit.  Coagulation Studies: No results for input(s): LABPROT, INR in the last 72 hours.  Urinalysis: Recent Labs    08/30/18 1810  COLORURINE YELLOW*  LABSPEC 1.012  PHURINE 6.0  GLUCOSEU 150*  HGBUR NEGATIVE  BILIRUBINUR NEGATIVE  KETONESUR NEGATIVE  PROTEINUR >=300*  NITRITE NEGATIVE  LEUKOCYTESUR NEGATIVE      Imaging: US Renal  Result Date: 08/31/2018 CLINICAL DATA:  Acute renal failure. EXAM: RENAL / URINARY TRACT ULTRASOUND COMPLETE COMPARISON:  None. FINDINGS: Right Kidney: Renal measurements: 10.0 x 6.2 x 6.1 cm = volume: 196 mL. Increased parenchymal echogenicity. No mass or hydronephrosis identified. Small volume perinephric fluid. Left Kidney: Renal measurements: 10.0 x 4.5 x 4.8 cm = volume: 113 mL. Increased parenchymal echogenicity. No mass or hydronephrosis identified. Bladder: Appears normal for degree of bladder distention. IMPRESSION: 1. Echogenic kidneys suggesting medical  renal disease. No hydronephrosis. 2. Small volume right perinephric fluid. Electronically Signed   By: Logan Bores M.D.   On: 08/31/2018 10:02     Medications:    . amLODipine  10 mg Oral Daily  . amoxicillin-clavulanate  1 tablet Oral BID  . docusate sodium  100 mg Oral BID  . heparin  5,000 Units Subcutaneous Q8H  . insulin aspart  0-5 Units Subcutaneous QHS  . insulin aspart  0-9 Units Subcutaneous TID WC  . nitroGLYCERIN  0.5 inch Topical Q6H   acetaminophen **OR** acetaminophen, labetalol, ondansetron **OR** ondansetron (ZOFRAN) IV  Assessment/ Plan:  Mr. Casey Franco is a 57 y.o. black male with Mr. Casey Franco is a 57 y.o. black male with diabetes mellitus type II, hypertension, who was admitted to  [facility] on 08/30/2018 for dental infection.  New diagnosis of hepatitis C.   1. Acute renal failure on chronic kidney disease NOS versus progression of chronic kidney disease to stage V. 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  11 grams of proteinuria. Nephrotic range.  Last creatinine available is 1.3, normal GFR on 07/10/2014.  No acute indication for dialysis. Dialysis education provided.  Renal ultrasound reviewed with patient.  Serum complements are normal.  SPEP/UPEP negative. Serologic work up negative.  At this time, a renal biopsy would not be clinically appropriate.   Outpatient follow up with nephrology - will need to start outpatient planning. Patient desires peritoneal dialysis.   2. Hypertension: elevated. Started on amlodipine this admission  3. Anemia of chronic kidney disease: hemoglobin 11  4. Hypocalcemia: corrected calcium at goal. Phosphorus at goal. PTH level is low   LOS: 2 [name] 11/20/20193:36 PM

## 2018-09-02 LAB — PROTEIN ELECTRO, RANDOM URINE
ALPHA-1-GLOBULIN, U: 6.3 %
Albumin ELP, Urine: 52.5 %
Alpha-2-Globulin, U: 10.4 %
Beta Globulin, U: 14.7 %
Gamma Globulin, U: 16 %
M SPIKE UR: 2.6 % — AB
Total Protein, Urine: 881.9 mg/dL

## 2018-09-03 LAB — DRUG SCREEN 764883 11+OXYCO+ALC+CRT-BUND
Amphetamines, Urine: NEGATIVE ng/mL
BARBITURATE: NEGATIVE ng/mL
BENZODIAZ UR QL: NEGATIVE ng/mL
CREATININE: 72.6 mg/dL (ref 20.0–300.0)
Cannabinoid Quant, Ur: NEGATIVE ng/mL
Ethanol: NEGATIVE %
Meperidine: NEGATIVE ng/mL
Methadone Screen, Urine: NEGATIVE ng/mL
OPIATE SCREEN URINE: NEGATIVE ng/mL
Oxycodone/Oxymorphone, Urine: NEGATIVE ng/mL
Phencyclidine: NEGATIVE ng/mL
Propoxyphene, Urine: NEGATIVE ng/mL
TRAMADOL: NEGATIVE ng/mL
pH, Urine: 6 (ref 4.5–8.9)

## 2018-09-03 LAB — QUANTIFERON-TB GOLD PLUS (RQFGPL)
QUANTIFERON MITOGEN VALUE: 8.15 [IU]/mL
QUANTIFERON NIL VALUE: 0.02 [IU]/mL
QUANTIFERON TB1 AG VALUE: 0.03 [IU]/mL
QUANTIFERON TB2 AG VALUE: 0.02 [IU]/mL

## 2018-09-03 LAB — COCAINE CONF, UR
Benzoylecgonine GC/MS Conf: >5000 ng/mL
COCAINE METAB QUANT UR: POSITIVE — AB

## 2018-09-03 LAB — QUANTIFERON-TB GOLD PLUS: QUANTIFERON-TB GOLD PLUS: NEGATIVE

## 2018-09-03 NOTE — Progress Notes (Signed)
[place] [facility] [place] was admitted to the [facility] on 08/30/2018 and Discharged  09/03/2018 and should be excused from work/school   for 7 days starting 08/30/2018 , may return to work/school without any restrictions.  Call Dustin Flock MD with questions.  Dustin Flock M.D on 09/03/2018,at 12:16 PM  [place] [facility]  [phone]

## 2019-03-16 ENCOUNTER — Other Ambulatory Visit: Payer: Self-pay | Admitting: Student

## 2019-03-16 DIAGNOSIS — B182 Chronic viral hepatitis C: Secondary | ICD-10-CM

## 2020-05-14 DIAGNOSIS — Z8639 Personal history of other endocrine, nutritional and metabolic disease: Secondary | ICD-10-CM | POA: Insufficient documentation

## 2020-05-14 DIAGNOSIS — F1491 Cocaine use, unspecified, in remission: Secondary | ICD-10-CM | POA: Insufficient documentation

## 2020-05-16 DIAGNOSIS — N2581 Secondary hyperparathyroidism of renal origin: Secondary | ICD-10-CM | POA: Insufficient documentation

## 2020-05-30 ENCOUNTER — Other Ambulatory Visit: Payer: Self-pay

## 2020-05-30 ENCOUNTER — Encounter: Payer: Self-pay | Admitting: Nephrology

## 2020-05-30 ENCOUNTER — Ambulatory Visit
Admission: RE | Admit: 2020-05-30 | Discharge: 2020-05-30 | Disposition: A | Discharge status: Home / Self Care | Payer: Medicaid Other | Source: Ambulatory Visit | Attending: Internal Medicine | Admitting: Internal Medicine

## 2020-05-30 DIAGNOSIS — D649 Anemia, unspecified: Secondary | ICD-10-CM | POA: Insufficient documentation

## 2020-05-30 LAB — HEMOGLOBIN: Hemoglobin: 6.2 g/dL — ABNORMAL LOW (ref 13.0–17.0)

## 2020-05-30 LAB — ABO/RH: ABO/RH(D): O POS

## 2020-05-31 LAB — BPAM RBC
Blood Product Expiration Date: 202109192359
ISSUE DATE / TIME: 202108181040
Unit Type and Rh: 5100

## 2020-05-31 LAB — TYPE AND SCREEN
ABO/RH(D): O POS
Antibody Screen: NEGATIVE
Unit division: 0

## 2020-06-01 LAB — PREPARE RBC (CROSSMATCH)

## 2020-07-17 ENCOUNTER — Telehealth (INDEPENDENT_AMBULATORY_CARE_PROVIDER_SITE_OTHER): Payer: Self-pay

## 2020-07-17 NOTE — Telephone Encounter (Signed)
I attempted to contact the patient and a message was left for a return call. 

## 2020-09-03 ENCOUNTER — Encounter: Payer: Self-pay | Admitting: General Practice

## 2020-09-13 IMAGING — US US RENAL
1 series · 14 of 25 positions shown · non-contrast
Comparison: None.

CLINICAL DATA: Acute renal failure.

EXAM:
RENAL / URINARY TRACT ULTRASOUND COMPLETE

[Series 1: us renal · 14 of 38 slices shown]
[im 1/38]
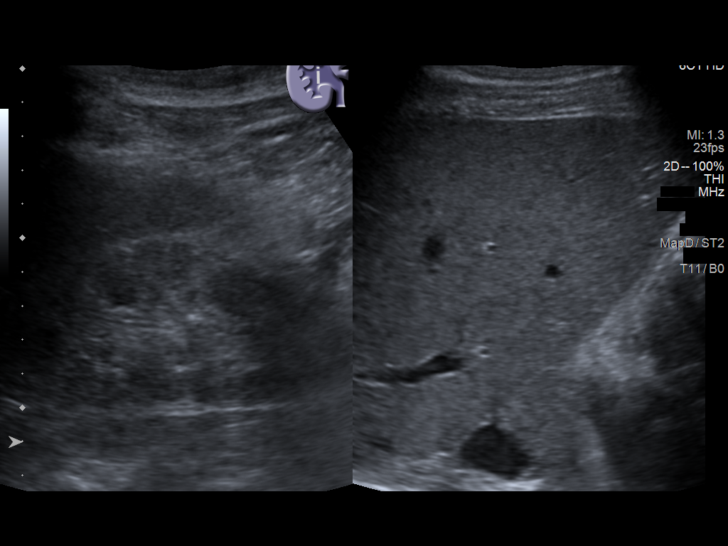
[im 4/38]
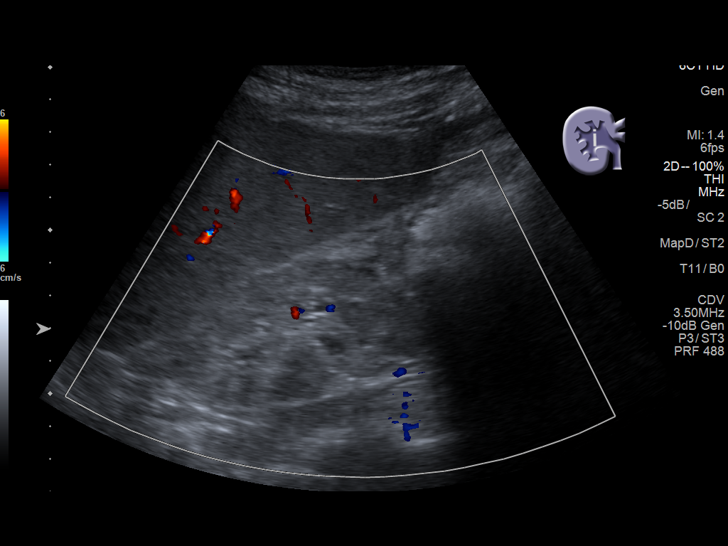
[im 7/38]
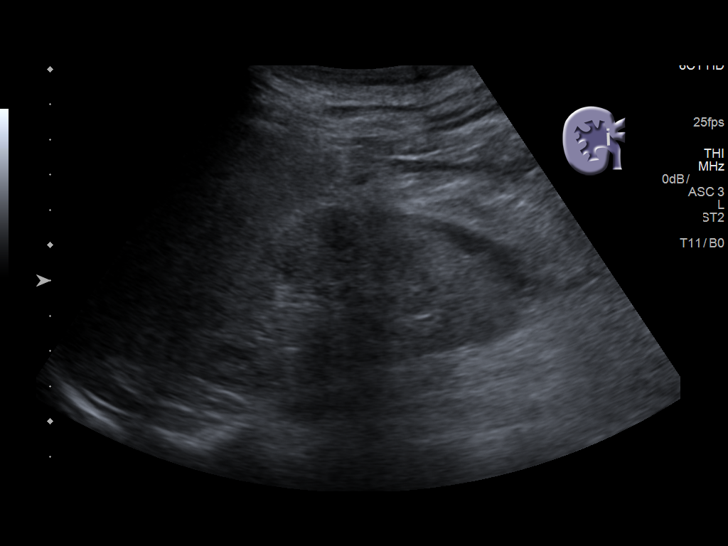
[im 10/38]
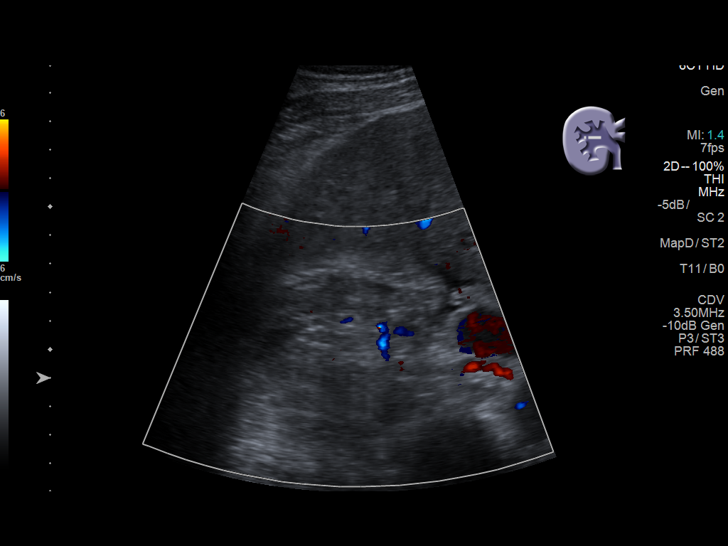
[im 13/38]
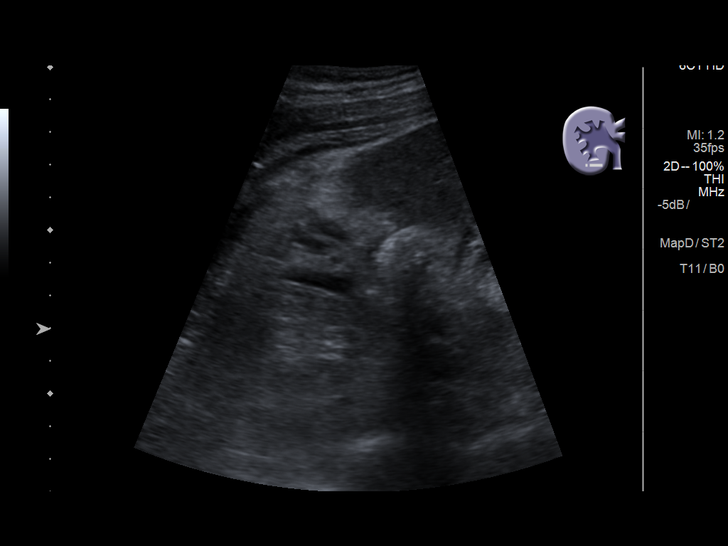
[im 14/38]
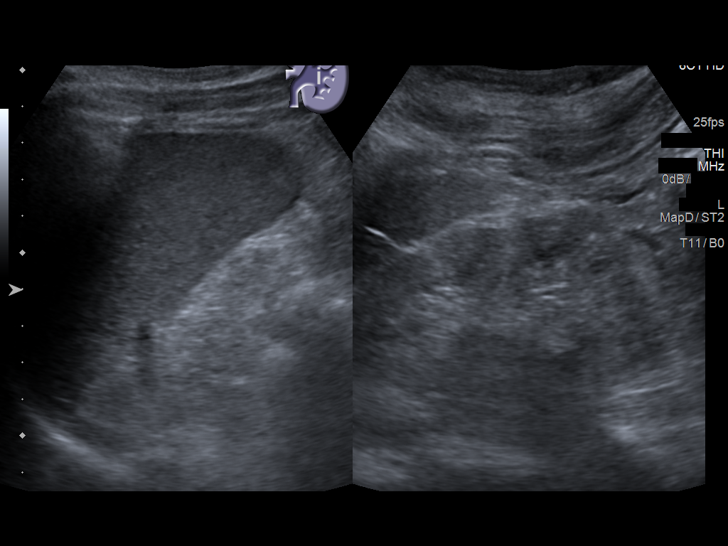
[im 17/38]
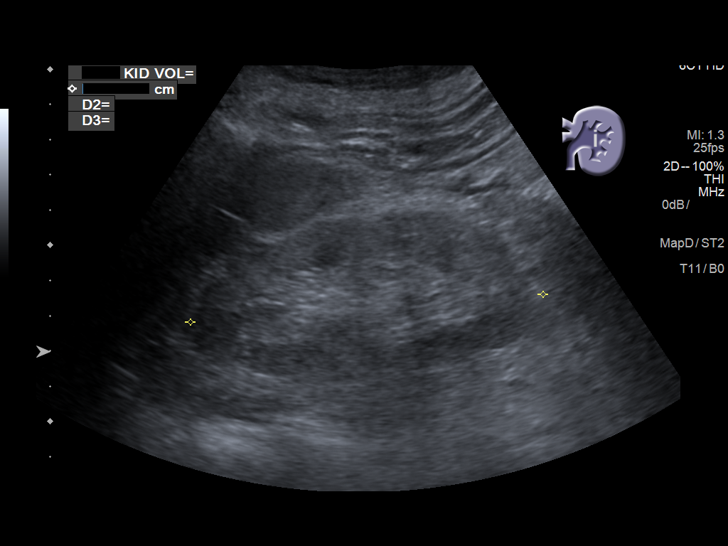
[im 21/38]
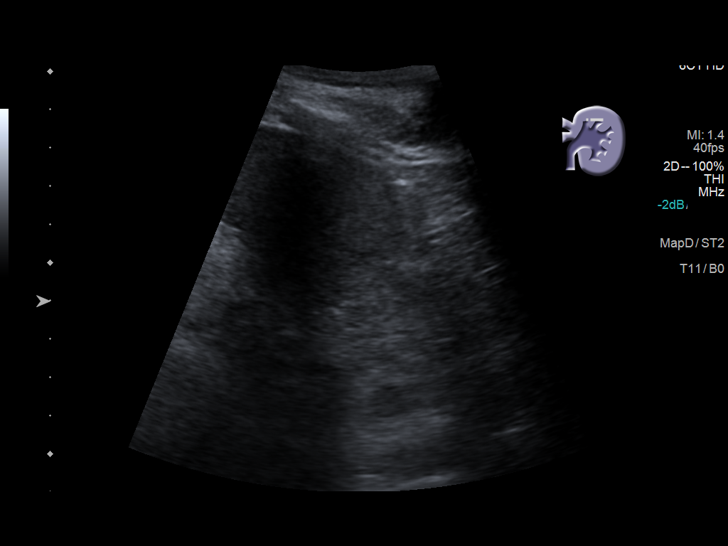
[im 24/38]
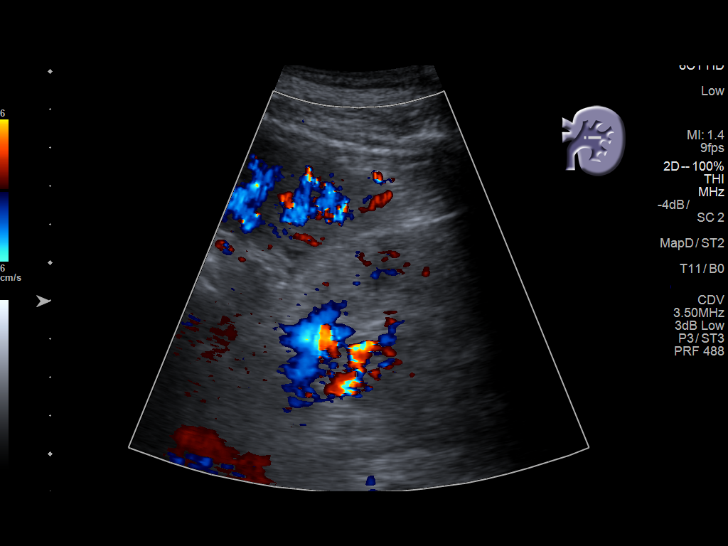
[im 25/38]
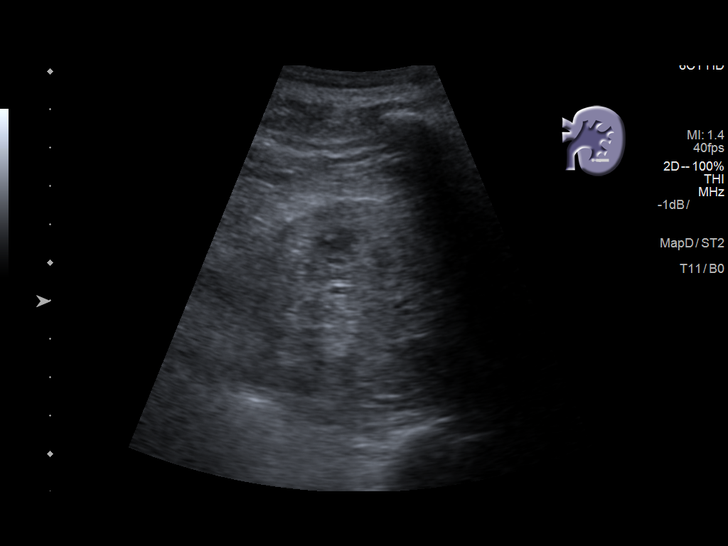
[im 28/38]
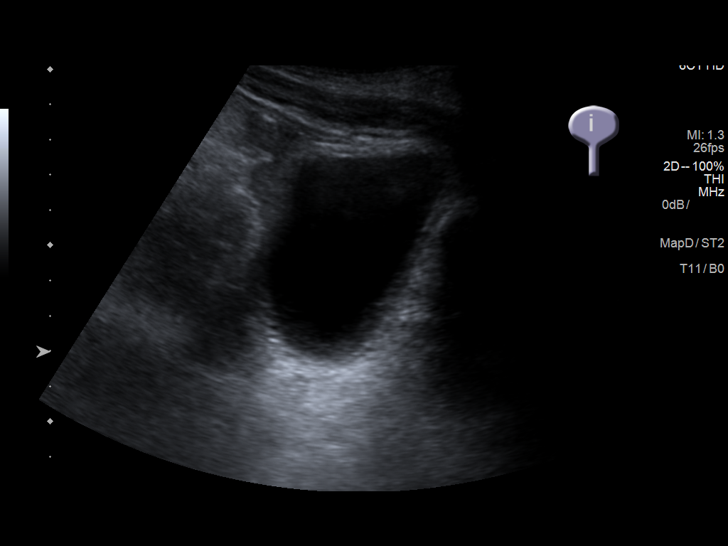
[im 31/38]
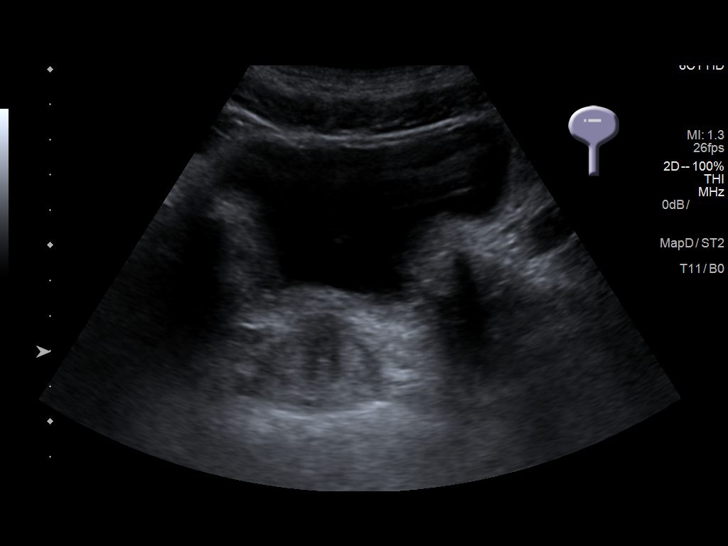
[im 34/38]
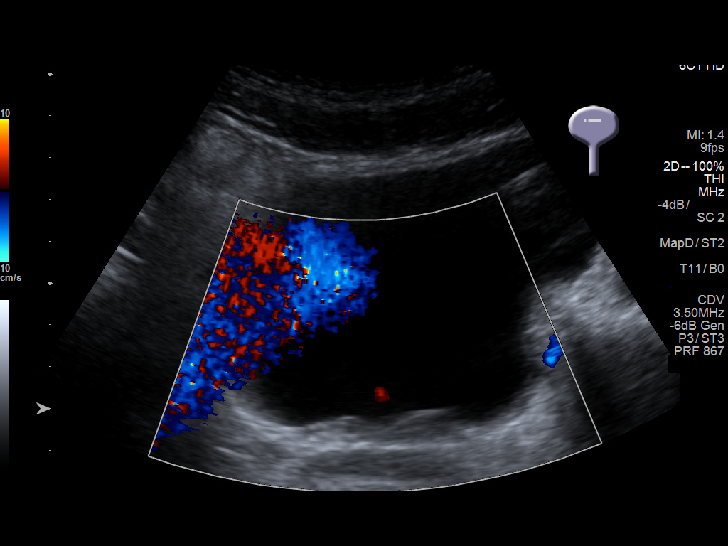
[im 38/38]
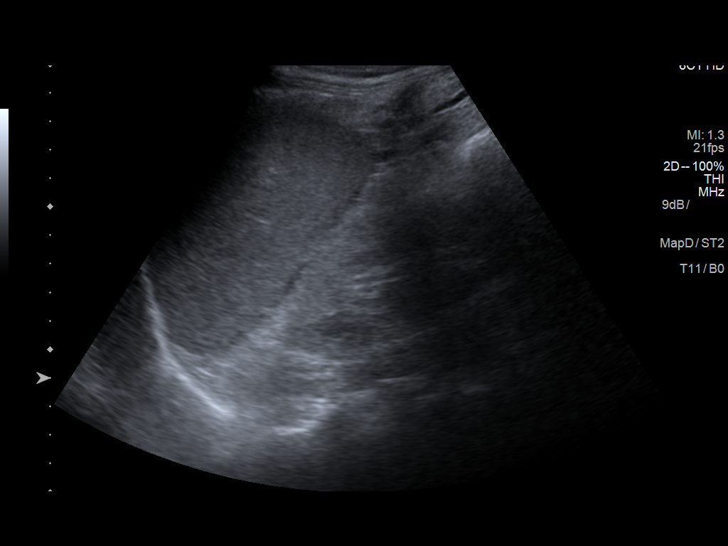

[14 of 25 positions shown; findings below may reference images not displayed]

FINDINGS: Right Kidney:

Renal measurements: 10.0 x 6.2 x 6.1 cm = volume: 196 mL. Increased
parenchymal echogenicity. No mass or hydronephrosis identified.
Small volume perinephric fluid.

Left Kidney:

Renal measurements: 10.0 x 4.5 x 4.8 cm = volume: 113 mL. Increased
parenchymal echogenicity. No mass or hydronephrosis identified.

Bladder:

Appears normal for degree of bladder distention.
IMPRESSION: 1. Echogenic kidneys suggesting medical renal disease. No
hydronephrosis.
2. Small volume right perinephric fluid.

## 2021-01-30 ENCOUNTER — Other Ambulatory Visit: Payer: Self-pay

## 2021-01-30 ENCOUNTER — Emergency Department
Admission: EM | Admit: 2021-01-30 | Discharge: 2021-01-30 | Discharge status: Against Medical Advice | Payer: Medicaid Other | Attending: Emergency Medicine | Admitting: Emergency Medicine

## 2021-01-30 ENCOUNTER — Telehealth: Payer: Self-pay | Admitting: Emergency Medicine

## 2021-01-30 ENCOUNTER — Encounter: Payer: Self-pay | Admitting: Emergency Medicine

## 2021-01-30 DIAGNOSIS — E119 Type 2 diabetes mellitus without complications: Secondary | ICD-10-CM | POA: Insufficient documentation

## 2021-01-30 DIAGNOSIS — N186 End stage renal disease: Secondary | ICD-10-CM | POA: Insufficient documentation

## 2021-01-30 DIAGNOSIS — Z992 Dependence on renal dialysis: Secondary | ICD-10-CM | POA: Insufficient documentation

## 2021-01-30 DIAGNOSIS — I120 Hypertensive chronic kidney disease with stage 5 chronic kidney disease or end stage renal disease: Secondary | ICD-10-CM | POA: Diagnosis present

## 2021-01-30 DIAGNOSIS — E875 Hyperkalemia: Secondary | ICD-10-CM

## 2021-01-30 HISTORY — DX: Disorder of kidney and ureter, unspecified: N28.9

## 2021-01-30 LAB — BASIC METABOLIC PANEL
Anion gap: 18 — ABNORMAL HIGH (ref 5–15)
BUN: 132 mg/dL — ABNORMAL HIGH (ref 6–20)
CO2: 9 mmol/L — ABNORMAL LOW (ref 22–32)
Calcium: 7.8 mg/dL — ABNORMAL LOW (ref 8.9–10.3)
Chloride: 112 mmol/L — ABNORMAL HIGH (ref 98–111)
Creatinine, Ser: 19.51 mg/dL — ABNORMAL HIGH (ref 0.61–1.24)
GFR, Estimated: 2 mL/min — ABNORMAL LOW (ref >60)
Glucose, Bld: 94 mg/dL (ref 70–99)
Potassium: 6.5 mmol/L (ref 3.5–5.1)
Sodium: 139 mmol/L (ref 135–145)

## 2021-01-30 LAB — CBC
HCT: 25.4 % — ABNORMAL LOW (ref 39.0–52.0)
Hemoglobin: 8.3 g/dL — ABNORMAL LOW (ref 13.0–17.0)
MCH: 29.7 pg (ref 26.0–34.0)
MCHC: 32.7 g/dL (ref 30.0–36.0)
MCV: 91 fL (ref 80.0–100.0)
Platelets: 194 10*3/uL (ref 150–400)
RBC: 2.79 MIL/uL — ABNORMAL LOW (ref 4.22–5.81)
RDW: 16.2 % — ABNORMAL HIGH (ref 11.5–15.5)
WBC: 7.4 10*3/uL (ref 4.0–10.5)
nRBC: 0 % (ref 0.0–0.2)

## 2021-01-30 NOTE — ED Triage Notes (Signed)
Pt to ED via POV with c/o missed dialysis x 1 week. Pt states is a MWF dialysis patient, last dialysis 4/11. Pt deneis any complaints at this time.

## 2021-01-30 NOTE — Telephone Encounter (Signed)
I called patient at [facility] request due to needs dialysis today and he eloped.  I also tried a number that was in demographics for [facility] and now answer-no vm.  I called [facility] [address] and they said they actually sent him here and they will send him back if he contacts them, but they do not have any additional numbers to try.

## 2021-01-30 NOTE — ED Notes (Signed)
Pt not in room or bathroom on assessment. Per triage RN, pt was seen leaving without IV. Pt did not notify staff, did not stay to sign AMA paperwork. Pt was informed of risk of injury/death upon leaving prior to medical discharge per first nurse. MD notified.

## 2021-01-30 NOTE — ED Provider Notes (Signed)
[facility] Emergency Department Provider Note  Time seen: 12:34 PM  I have reviewed the triage vital signs and the nursing notes.   HISTORY  Chief Complaint Missed Dialysis   HPI Casey Franco is a 60 y.o. male with a past medical history of diabetes, hypertension, ESRD on HD who presents to the emergency department referred from his dialysis center.  According to the patient he was out of town on vacation this past week and missed dialysis for 1 week.  He states the dialysis center would not see him because he missed too many appointments so they sent him to the emergency department.  Patient denies any shortness of breath.  States he does create a small amount of urine each day.  No fever.  States he is feeling well.  Has no complaints.   Past Medical History:  Diagnosis Date   Diabetes mellitus without complication ([place])    Hypertension    Renal disorder     Patient Active Problem List   Diagnosis Date Noted   AKI (acute kidney injury) ([place]) 08/30/2018    Past Surgical History:  Procedure Laterality Date   INSERTION OF DIALYSIS CATHETER     LEG SURGERY  1987    Prior to Admission medications   Medication Sig Start Date End Date Taking? Authorizing Provider  amLODipine (NORVASC) 10 MG tablet Take 1 tablet (10 mg total) by mouth daily. 09/01/18 10/01/18  Dustin Flock, MD  blood glucose meter kit and supplies KIT Dispense based on patient and insurance preference. Use up to four times daily as directed. (FOR ICD-9 250.00, 250.01). 09/01/18   Dustin Flock, MD    No Known Allergies  Family History  Problem Relation Age of Onset   Diabetes Mellitus II Sister    Diabetes Mellitus II Paternal Grandmother     Social History Social History   Tobacco Use   Smoking status: Never Smoker   Smokeless tobacco: Never Used  Substance Use Topics   Alcohol use: Yes   Drug use: Yes    Types: Cocaine    Comment: Friday 08/27/2018    Review of  Systems Constitutional: Negative for fever. Cardiovascular: Negative for chest pain. Respiratory: Negative for shortness of breath. Gastrointestinal: Negative for abdominal pain, vomiting  Musculoskeletal: Negative for musculoskeletal complaints Neurological: Negative for headache All other ROS negative  ____________________________________________   PHYSICAL EXAM:  VITAL SIGNS: ED Triage Vitals  Enc Vitals Group     BP 01/30/21 1200 (!) 168/95     Pulse Rate 01/30/21 1205 94     Resp 01/30/21 1209 18     Temp 01/30/21 1233 98.5 F (36.9 C)     Temp Source 01/30/21 1233 Oral     SpO2 01/30/21 1205 100 %     Weight 01/30/21 1202 150 lb (68 kg)     Height 01/30/21 1202 6' (1.829 m)     Head Circumference --      Peak Flow --      Pain Score 01/30/21 1202 0     Pain Loc --      Pain Edu? --      Excl. in [place]? --     Constitutional: Alert and oriented. Well appearing and in no distress. Eyes: Normal exam ENT      Head: Normocephalic and atraumatic.      Mouth/Throat: Mucous membranes are moist. Cardiovascular: Normal rate, regular rhythm Respiratory: Normal respiratory effort without tachypnea nor retractions. Breath sounds are clear Gastrointestinal:  Soft and nontender. No distention. Musculoskeletal: Nontender with normal range of motion in all extremities.  Neurologic:  Normal speech and language. No gross focal neurologic deficits  Skin:  Skin is warm, dry and intact.  Psychiatric: Mood and affect are normal.   ____________________________________________    EKG  EKG viewed and interpreted by myself shows a normal sinus rhythm at 94 bpm with a narrow QRS, normal axis, normal intervals, nonspecific ST changes.  ____________________________________________   INITIAL IMPRESSION / ASSESSMENT AND PLAN / ED COURSE  Pertinent labs & imaging results that were available during my care of the patient were reviewed by me and considered in my medical decision making (see  chart for details).   Patient presents to the emergency department referred from his dialysis center after missing 1 week of dialysis.  Patient has no complaints today.  We will check labs to assess the patient's need for urgent dialysis.  Patient agreeable to plan of care.  Has eloped from the emergency department prior to lab results.  Patient's labs have resulted showing significant hyperkalemia with a potassium of 6.5.  Attempted to call the patient multiple times and no one has answered.  There is no voicemail.  Attempted to call the patient's emergency contact and again no answer as well.  Casey Franco was evaluated in Emergency Department on 01/30/2021 for the symptoms described in the history of present illness. He was evaluated in the context of the global COVID-19 pandemic, which necessitated consideration that the patient might be at risk for infection with the SARS-CoV-2 virus that causes COVID-19. Institutional protocols and algorithms that pertain to the evaluation of patients at risk for COVID-19 are in a state of rapid change based on information released by regulatory bodies including the CDC and federal and state organizations. These policies and algorithms were followed during the patient's care in the ED.  ____________________________________________   FINAL CLINICAL IMPRESSION(S) / ED DIAGNOSES  Missed dialysis   Casey Dark, MD 01/30/21 1335

## 2021-02-04 ENCOUNTER — Observation Stay
Admission: EM | Admit: 2021-02-04 | Discharge: 2021-02-05 | Disposition: A | Discharge status: Home / Self Care | Payer: Medicaid Other | Attending: Internal Medicine | Admitting: Internal Medicine

## 2021-02-04 ENCOUNTER — Emergency Department: Payer: Medicaid Other

## 2021-02-04 ENCOUNTER — Other Ambulatory Visit: Payer: Self-pay

## 2021-02-04 DIAGNOSIS — N19 Unspecified kidney failure: Secondary | ICD-10-CM

## 2021-02-04 DIAGNOSIS — R0602 Shortness of breath: Secondary | ICD-10-CM | POA: Diagnosis present

## 2021-02-04 DIAGNOSIS — D638 Anemia in other chronic diseases classified elsewhere: Secondary | ICD-10-CM

## 2021-02-04 DIAGNOSIS — E8729 Other acidosis: Secondary | ICD-10-CM

## 2021-02-04 DIAGNOSIS — Z992 Dependence on renal dialysis: Secondary | ICD-10-CM | POA: Diagnosis not present

## 2021-02-04 DIAGNOSIS — I120 Hypertensive chronic kidney disease with stage 5 chronic kidney disease or end stage renal disease: Secondary | ICD-10-CM | POA: Insufficient documentation

## 2021-02-04 DIAGNOSIS — Z20822 Contact with and (suspected) exposure to covid-19: Secondary | ICD-10-CM | POA: Insufficient documentation

## 2021-02-04 DIAGNOSIS — N186 End stage renal disease: Secondary | ICD-10-CM | POA: Diagnosis not present

## 2021-02-04 DIAGNOSIS — E872 Acidosis: Secondary | ICD-10-CM | POA: Diagnosis not present

## 2021-02-04 DIAGNOSIS — E875 Hyperkalemia: Secondary | ICD-10-CM

## 2021-02-04 DIAGNOSIS — B182 Chronic viral hepatitis C: Secondary | ICD-10-CM | POA: Diagnosis not present

## 2021-02-04 DIAGNOSIS — I10 Essential (primary) hypertension: Secondary | ICD-10-CM

## 2021-02-04 DIAGNOSIS — E1122 Type 2 diabetes mellitus with diabetic chronic kidney disease: Secondary | ICD-10-CM | POA: Insufficient documentation

## 2021-02-04 DIAGNOSIS — Z79899 Other long term (current) drug therapy: Secondary | ICD-10-CM | POA: Diagnosis not present

## 2021-02-04 DIAGNOSIS — D631 Anemia in chronic kidney disease: Secondary | ICD-10-CM | POA: Diagnosis not present

## 2021-02-04 HISTORY — DX: Hyperkalemia: E87.5

## 2021-02-04 HISTORY — DX: Other acidosis: E87.29

## 2021-02-04 LAB — CBC
HCT: 22.7 % — ABNORMAL LOW (ref 39.0–52.0)
Hemoglobin: 7.4 g/dL — ABNORMAL LOW (ref 13.0–17.0)
MCH: 29.6 pg (ref 26.0–34.0)
MCHC: 32.6 g/dL (ref 30.0–36.0)
MCV: 90.8 fL (ref 80.0–100.0)
Platelets: 150 10*3/uL (ref 150–400)
RBC: 2.5 MIL/uL — ABNORMAL LOW (ref 4.22–5.81)
RDW: 16.1 % — ABNORMAL HIGH (ref 11.5–15.5)
WBC: 6.7 10*3/uL (ref 4.0–10.5)
nRBC: 0 % (ref 0.0–0.2)

## 2021-02-04 LAB — BASIC METABOLIC PANEL
Anion gap: 18 — ABNORMAL HIGH (ref 5–15)
BUN: 139 mg/dL — ABNORMAL HIGH (ref 6–20)
BUN: 136 mg/dL — ABNORMAL HIGH (ref 6–20)
CO2: 9 mmol/L — ABNORMAL LOW (ref 22–32)
CO2: <7 mmol/L — ABNORMAL LOW (ref 22–32)
Calcium: 7.5 mg/dL — ABNORMAL LOW (ref 8.9–10.3)
Calcium: 7.5 mg/dL — ABNORMAL LOW (ref 8.9–10.3)
Chloride: 110 mmol/L (ref 98–111)
Chloride: 110 mmol/L (ref 98–111)
Creatinine, Ser: 22.67 mg/dL — ABNORMAL HIGH (ref 0.61–1.24)
Creatinine, Ser: 22.72 mg/dL — ABNORMAL HIGH (ref 0.61–1.24)
GFR, Estimated: 2 mL/min — ABNORMAL LOW (ref >60)
GFR, Estimated: 2 mL/min — ABNORMAL LOW (ref >60)
Glucose, Bld: 108 mg/dL — ABNORMAL HIGH (ref 70–99)
Glucose, Bld: 101 mg/dL — ABNORMAL HIGH (ref 70–99)
Potassium: 6.3 mmol/L (ref 3.5–5.1)
Potassium: 5.8 mmol/L — ABNORMAL HIGH (ref 3.5–5.1)
Sodium: 137 mmol/L (ref 135–145)
Sodium: 137 mmol/L (ref 135–145)

## 2021-02-04 LAB — CBG MONITORING, ED
Glucose-Capillary: 59 mg/dL — ABNORMAL LOW (ref 70–99)
Glucose-Capillary: 108 mg/dL — ABNORMAL HIGH (ref 70–99)

## 2021-02-04 LAB — MAGNESIUM: Magnesium: 2 mg/dL (ref 1.7–2.4)

## 2021-02-04 LAB — TROPONIN I (HIGH SENSITIVITY)
Troponin I (High Sensitivity): 79 ng/L — ABNORMAL HIGH (ref <18)
Troponin I (High Sensitivity): 80 ng/L — ABNORMAL HIGH (ref <18)

## 2021-02-04 LAB — RESP PANEL BY RT-PCR (FLU A&B, COVID) ARPGX2
Influenza A by PCR: NEGATIVE
Influenza B by PCR: NEGATIVE
SARS Coronavirus 2 by RT PCR: NEGATIVE

## 2021-02-04 LAB — POC SARS CORONAVIRUS 2 AG -  ED: SARS Coronavirus 2 Ag: NEGATIVE

## 2021-02-04 LAB — BRAIN NATRIURETIC PEPTIDE: B Natriuretic Peptide: 1828.8 pg/mL — ABNORMAL HIGH (ref 0.0–100.0)

## 2021-02-04 LAB — PHOSPHORUS: Phosphorus: 11.2 mg/dL — ABNORMAL HIGH (ref 2.5–4.6)

## 2021-02-04 MED ORDER — FUROSEMIDE 10 MG/ML IJ SOLN
100.0000 mg | Freq: Once | INTRAVENOUS | Status: AC
  Administered 2021-02-04: 100 mg via INTRAVENOUS
  Filled 2021-02-04: qty 10

## 2021-02-04 MED ORDER — ACETAMINOPHEN 650 MG RE SUPP: 650.0000 mg | Freq: Four times a day (QID) | RECTAL | Status: DC | PRN

## 2021-02-04 MED ORDER — INSULIN ASPART 100 UNIT/ML ~~LOC~~ SOLN
5.0000 [IU] | Freq: Once | SUBCUTANEOUS | Status: AC
  Administered 2021-02-04: 5 [IU] via INTRAVENOUS
  Filled 2021-02-04: qty 1

## 2021-02-04 MED ORDER — HEPARIN SODIUM (PORCINE) 5000 UNIT/ML IJ SOLN
5000.0000 [IU] | Freq: Three times a day (TID) | INTRAMUSCULAR | Status: DC
  Filled 2021-02-04: qty 1

## 2021-02-04 MED ORDER — AMLODIPINE BESYLATE 5 MG PO TABS
10.0000 mg | ORAL_TABLET | Freq: Every day | ORAL | Status: DC
  Administered 2021-02-04 – 2021-02-05 (×2): 10 mg via ORAL
  Filled 2021-02-04 (×2): qty 2

## 2021-02-04 MED ORDER — SODIUM ZIRCONIUM CYCLOSILICATE 10 G PO PACK
10.0000 g | PACK | Freq: Once | ORAL | Status: AC
  Administered 2021-02-04: 10 g via ORAL
  Filled 2021-02-04: qty 1

## 2021-02-04 MED ORDER — CALCIUM GLUCONATE-NACL 1-0.675 GM/50ML-% IV SOLN
1.0000 g | Freq: Once | INTRAVENOUS | Status: AC
  Administered 2021-02-04: 1000 mg via INTRAVENOUS
  Filled 2021-02-04: qty 50

## 2021-02-04 MED ORDER — ACETAMINOPHEN 325 MG PO TABS: 650.0000 mg | ORAL_TABLET | Freq: Four times a day (QID) | ORAL | Status: DC | PRN

## 2021-02-04 MED ORDER — LABETALOL HCL 5 MG/ML IV SOLN
10.0000 mg | Freq: Once | INTRAVENOUS | Status: AC
  Administered 2021-02-04: 10 mg via INTRAVENOUS
  Filled 2021-02-04: qty 4

## 2021-02-04 MED ORDER — SODIUM BICARBONATE 8.4 % IV SOLN
25.0000 meq | Freq: Once | INTRAVENOUS | Status: AC
  Administered 2021-02-04: 25 meq via INTRAVENOUS
  Filled 2021-02-04: qty 50

## 2021-02-04 MED ORDER — ALBUTEROL SULFATE (2.5 MG/3ML) 0.083% IN NEBU
2.5000 mg | INHALATION_SOLUTION | Freq: Four times a day (QID) | RESPIRATORY_TRACT | Status: AC
  Administered 2021-02-04 – 2021-02-05 (×3): 2.5 mg via RESPIRATORY_TRACT
  Filled 2021-02-04 (×4): qty 3

## 2021-02-04 MED ORDER — DEXTROSE 50 % IV SOLN
1.0000 | Freq: Once | INTRAVENOUS | Status: AC
  Administered 2021-02-04: 50 mL via INTRAVENOUS
  Filled 2021-02-04: qty 50

## 2021-02-04 MED ORDER — ALBUTEROL SULFATE (2.5 MG/3ML) 0.083% IN NEBU
5.0000 mg | INHALATION_SOLUTION | Freq: Once | RESPIRATORY_TRACT | Status: AC
  Administered 2021-02-04: 5 mg via RESPIRATORY_TRACT
  Filled 2021-02-04: qty 6

## 2021-02-04 MED ORDER — LABETALOL HCL 5 MG/ML IV SOLN: 5.0000 mg | INTRAVENOUS | Status: DC | PRN

## 2021-02-04 MED ORDER — ONDANSETRON HCL 4 MG PO TABS: 4.0000 mg | ORAL_TABLET | Freq: Four times a day (QID) | ORAL | Status: DC | PRN

## 2021-02-04 MED ORDER — SODIUM BICARBONATE 650 MG PO TABS
1300.0000 mg | ORAL_TABLET | Freq: Once | ORAL | Status: AC
  Administered 2021-02-04: 1300 mg via ORAL
  Filled 2021-02-04: qty 2

## 2021-02-04 MED ORDER — ONDANSETRON HCL 4 MG/2ML IJ SOLN: 4.0000 mg | Freq: Four times a day (QID) | INTRAMUSCULAR | Status: DC | PRN

## 2021-02-04 NOTE — ED Provider Notes (Signed)
[facility] Emergency Department Provider Note  ____________________________________________   Event Date/Time   First MD Initiated Contact with Patient 02/04/21 1708     (approximate)  I have reviewed the triage vital signs and the nursing notes.   HISTORY  Chief Complaint No chief complaint on file.    HPI Casey Franco is a 60 y.o. male here with with need for dialysis.  The patient states that it has been over 2 weeks since his last dialysis session.  He was actually here last week for this  and eloped prior to lab results.  He states that since then, he has overall felt "not bad."  He states that over the last 2 days, he has had progressively worsening shortness of breath, worse when he lays flat.  He has had leg swelling.  Has had some general fatigue.  No chest pain.  No nausea or vomiting.  No abdominal pain.  He states he tried to go to dialysis today and they sent him here due to it being such a long time since his last session.  He states he is not short of breath when he is sitting upright at rest, but is significantly more short of breath with any kind of exertion or lying flat.  No alleviating factors.  No fevers or chills.       Past Medical History:  Diagnosis Date  . Diabetes mellitus without complication ([place])   . Hypertension   . Renal disorder     Patient Active Problem List   Diagnosis Date Noted  . AKI (acute kidney injury) ([place]) 08/30/2018    Past Surgical History:  Procedure Laterality Date  . INSERTION OF DIALYSIS [place]    Prior to Admission medications   Medication Sig Start Date End Date Taking? Authorizing Provider  amLODipine (NORVASC) 10 MG tablet Take 1 tablet (10 mg total) by mouth daily. 09/01/18 10/01/18  Dustin Flock, MD  blood glucose meter kit and supplies KIT Dispense based on patient and insurance preference. Use up to four times daily as directed. (FOR ICD-9 250.00, 250.01).  09/01/18   Dustin Flock, MD    Allergies Patient has no known allergies.  Family History  Problem Relation Age of Onset  . Diabetes Mellitus II Sister   . Diabetes Mellitus II Paternal Grandmother     Social History Social History   Tobacco Use  . Smoking status: Never Smoker  . Smokeless tobacco: Never Used  Substance Use Topics  . Alcohol use: Yes  . Drug use: Yes    Types: Cocaine    Comment: Friday 08/27/2018    Review of Systems  Review of Systems  Constitutional: Positive for fatigue. Negative for chills and fever.  HENT: Negative for sore throat.   Respiratory: Positive for shortness of breath.   Cardiovascular: Positive for leg swelling. Negative for chest pain.  Gastrointestinal: Negative for abdominal pain.  Genitourinary: Negative for flank pain.  Musculoskeletal: Negative for neck pain.  Skin: Negative for rash and wound.  Allergic/Immunologic: Negative for immunocompromised state.  Neurological: Negative for weakness and numbness.  Hematological: Does not bruise/bleed easily.  All other systems reviewed and are negative.    ____________________________________________  PHYSICAL EXAM:      VITAL SIGNS: ED Triage Vitals  Enc Vitals Group     BP 02/04/21 1709 (!) 185/102     Pulse Rate 02/04/21 1709 (!) 102     Resp 02/04/21 1709 (!)  22     Temp 02/04/21 1709 98 F (36.7 C)     Temp Source 02/04/21 1709 Oral     SpO2 02/04/21 1709 100 %     Weight 02/04/21 1705 140 lb (63.5 kg)     Height 02/04/21 1705 6' (1.829 m)     Head Circumference --      Peak Flow --      Pain Score --      Pain Loc --      Pain Edu? --      Excl. in [place]? --      Physical Exam Vitals and nursing note reviewed.  Constitutional:      General: He is not in acute distress.    Appearance: He is well-developed.  HENT:     Head: Normocephalic and atraumatic.  Eyes:     Conjunctiva/sclera: Conjunctivae normal.  Cardiovascular:     Rate and Rhythm: Regular rhythm.  Tachycardia present.     Heart sounds: Normal heart sounds. No murmur heard. No friction rub.  Pulmonary:     Effort: Pulmonary effort is normal. No respiratory distress.     Breath sounds: Examination of the right-lower field reveals rales. Examination of the left-lower field reveals rales. Rales present. No wheezing.  Abdominal:     General: There is no distension.     Palpations: Abdomen is soft.     Tenderness: There is no abdominal tenderness.  Musculoskeletal:     Cervical back: Neck supple.  Skin:    General: Skin is warm.     Capillary Refill: Capillary refill takes less than 2 seconds.  Neurological:     Mental Status: He is alert and oriented to person, place, and time.     Motor: No abnormal muscle tone.       ____________________________________________   LABS (all labs ordered are listed, but only abnormal results are displayed)  Labs Reviewed  CBC - Abnormal; Notable for the following components:      Result Value   RBC 2.50 (*)    Hemoglobin 7.4 (*)    HCT 22.7 (*)    RDW 16.1 (*)    All other components within normal limits  BASIC METABOLIC PANEL - Abnormal; Notable for the following components:   Potassium 6.3 (*)    CO2 9 (*)    Glucose, Bld 108 (*)    BUN 139 (*)    Creatinine, Ser 22.67 (*)    Calcium 7.5 (*)    GFR, Estimated 2 (*)    Anion gap 18 (*)    All other components within normal limits  RESP PANEL BY RT-PCR (FLU A&B, COVID) ARPGX2  BRAIN NATRIURETIC PEPTIDE  MAGNESIUM  PHOSPHORUS  POC SARS CORONAVIRUS 2 AG -  ED  TROPONIN I (HIGH SENSITIVITY)    ____________________________________________  EKG: Normal sinus rhythm, ventricular rate 100.  PR 173, QRS 99, QTc 42.  LVH.  Borderline prolonged QT.  No ST elevations. ________________________________________  RADIOLOGY All imaging, including plain films, CT scans, and ultrasounds, independently reviewed by me, and interpretations confirmed via formal radiology reads.  ED MD  interpretation:   CXR: vascular prominence w/o overt edema  Official radiology report(s): DG Chest Portable 1 View  Result Date: 02/04/2021 CLINICAL DATA:  Shortness of breath missed dialysis. EXAM: PORTABLE CHEST 1 VIEW COMPARISON:  None. FINDINGS: Right upper 0 chest dialysis catheter with tip overlying the superior cavoatrial junction. Borderline cardiac enlargement with central vascular prominence. No focal consolidation or overt  pulmonary edema. No visible pleural effusion or pneumothorax. The visualized skeletal structures are unremarkable. IMPRESSION: Borderline cardiac enlargement with central vascular prominence. No overt pulmonary edema or pleural effusion. Electronically Signed   By: Dahlia Bailiff MD   On: 02/04/2021 17:46    ____________________________________________  PROCEDURES   Procedure(s) performed (including Critical Care):  .Critical Care Performed by: Duffy Bruce, MD Authorized by: Duffy Bruce, MD   Critical care provider statement:    Critical care time (minutes):  35   Critical care time was exclusive of:  Separately billable procedures and treating other patients and teaching time   Critical care was necessary to treat or prevent imminent or life-threatening deterioration of the following conditions:  Circulatory failure, cardiac failure, respiratory failure and metabolic crisis   Critical care was time spent personally by me on the following activities:  Development of treatment plan with patient or surrogate, discussions with consultants, evaluation of patient's response to treatment, examination of patient, obtaining history from patient or surrogate, ordering and performing treatments and interventions, ordering and review of laboratory studies, ordering and review of radiographic studies, pulse oximetry, re-evaluation of patient's condition and review of old charts   I assumed direction of critical care for this patient from another provider in my  specialty: no      ____________________________________________  INITIAL IMPRESSION / MDM / [place] / ED COURSE  As part of my medical decision making, I reviewed the following data within the [place] notes reviewed and incorporated, Old chart reviewed, Notes from prior ED visits, and [place] Controlled Substance Database       *Casey Franco was evaluated in Emergency Department on 02/04/2021 for the symptoms described in the history of present illness. He was evaluated in the context of the global COVID-19 pandemic, which necessitated consideration that the patient might be at risk for infection with the SARS-CoV-2 virus that causes COVID-19. Institutional protocols and algorithms that pertain to the evaluation of patients at risk for COVID-19 are in a state of rapid change based on information released by regulatory bodies including the CDC and federal and state organizations. These policies and algorithms were followed during the patient's care in the ED.  Some ED evaluations and interventions may be delayed as a result of limited staffing during the pandemic.*     Medical Decision Making:  Very well appearing 59 yo M here with missing HD for 1 week. Pt is hypertensive but in no resp distress. EKG without overt changes beyond slight peaked T waves. Labs show severe hyperkalemia, uremia. CBC with chronic anemia. CXR reviewed, no overt pulm edema. Discussed with Dr. Candiss Norse. He recommends temporization, IV and PO Bicarb, lokelma tonight and in AM, will dialyze in hospital. Recommends hospitalist obs. Pt updated and is in agreement.  ____________________________________________  FINAL CLINICAL IMPRESSION(S) / ED DIAGNOSES  Final diagnoses:  Hyperkalemia  ESRD (end stage renal disease) (HCC)  Uremia     MEDICATIONS GIVEN DURING THIS VISIT:  Medications  calcium gluconate 1 g/ 50 mL sodium chloride IVPB (has no administration in time range)  insulin  aspart (novoLOG) injection 5 Units (has no administration in time range)  dextrose 50 % solution 50 mL (has no administration in time range)  albuterol (PROVENTIL) (2.5 MG/3ML) 0.083% nebulizer solution 5 mg (has no administration in time range)  sodium bicarbonate injection 25 mEq (has no administration in time range)  sodium zirconium cyclosilicate (LOKELMA) packet 10 g (has no administration in time range)  sodium bicarbonate tablet 1,300 mg (has no administration in time range)  labetalol (NORMODYNE) injection 10 mg (has no administration in time range)     ED Discharge Orders    None       Note:  This document was prepared using Dragon voice recognition software and may include unintentional dictation errors.   Duffy Bruce, MD 02/04/21 [phone]

## 2021-02-04 NOTE — H&P (Signed)
History and Physical   Casey Franco:[id number] DOB: July 11, 1961 DOA: 02/04/2021  PCP: Patient, No Pcp Per (Inactive)  Patient coming from: Home  I have personally briefly reviewed patient's old medical records in [place].  Chief Concern: Missed 2 weeks of dialysis  HPI: Casey Franco is a 60 y.o. male with medical history significant for hypertension, end-stage renal disease on hemodialysis, non-insulin-dependent diabetes mellitus, hypoalbuminemia, hypertensive urgency, hypokalemia, presents to the emergency department for chief concerns of missing dialysis for 2 weeks.  Patient states that over [place] weekend, his dialysis was rescheduled scheduled and/or close and therefore he missed that over Easter weekend.  And once that was dysregulated, he ended up missing 1 week.  And after 1 week of missing dialysis, his dialysis center no longer would allow him to go get dialysis and told him to go to the emergency department.  On 01/30/2021: He waited in the emergency department for 4 hours and did not get seen and therefore he left AGAINST MEDICAL ADVICE.  Today he presents hoping to receive dialysis and get seen earlier.  At bedside he denies chest pain, shortness of breath, nausea, vomiting.  He does endorse abdominal pain and states that he does want to make a bowel movement.  He denies fever, cough, chills, diarrhea, weakness.  Social history: He lives by himself.  He denies tobacco use.  He states that he smokes marijuana occasionally.  He denies EtOH and other recreational drug use.  Vaccination: Patient states he is vaccinated for COVID-19  ROS: Constitutional: no weight change, no fever ENT/Mouth: no sore throat, no rhinorrhea Eyes: no eye pain, no vision changes Cardiovascular: no chest pain, no dyspnea,  no edema, no palpitations Respiratory: no cough, no sputum, no wheezing Gastrointestinal: no nausea, no vomiting, no diarrhea, no constipation Genitourinary: no  urinary incontinence, no dysuria, no hematuria Musculoskeletal: no arthralgias, no myalgias Skin: no skin lesions, no pruritus, Neuro: + weakness, no loss of consciousness, no syncope Psych: no anxiety, no depression, + decrease appetite Heme/Lymph: no bruising, no bleeding  ED Course: Discussed with ED provider, patient requiring hospitalization for hyperkalemia.  Vitals in the emergency department show temperature of 98, respiration rate 18, heart rate 105, blood pressure 172/93, SPO2 of 100% on room air.  Labs in the emergency department was remarkable for potassium 6.3, bicarb 9, BUN 139, serum creatinine 7.5, nonfasting blood glucose 108, gap of 18, WBC 6.7, hemoglobin 7.4, platelets 150.  COVID PCR test is pending.  EKG in the emergency department showed sinus tachycardia with rate of 100, QTc 420, LVH.  EDP discussed patient case with nephrology and they recommended albuterol nebulizer, sodium bicarbonate 25 mEq IV, sodium bicarbonate tablet 1300 mg p.o., insulin aspart 5 units, dextrose 51 amp, Lokelma once.  Assessment/Plan  Principal Problem:   Hyperkalemia Active Problems:   ESRD (end stage renal disease) (HCC)   Essential hypertension   Metabolic acidosis, increased anion gap   Anemia of chronic disease   Chronic hepatitis C without hepatic coma (HCC)   Hyperkalemia secondary to missed dialysis session End-stage renal disease on hemodialysis - Since patient is not short of breath and no chest pain, plan for dialysis in the a.m. - I ordered Lasix 100 mg IV once, 3 more albuterol nebulizer treatment - Recheck BMP for 2200 -Nephrology has been consulted, Dr. Candiss Norse is aware of patient -Hemodialysis in the a.m.  Hypertension- resumed amlodipine 10 mg daily -Labetalol 5 mg IV every 2 hours as needed for SBP  greater than 170  Home medication-pending med rec completion by pharmacy tech  COVID testing is pending  BMP, CBC in the a.m.  As needed medication: Labetalol,  ondansetron, acetaminophen  Chart reviewed.   DVT prophylaxis: Heparin 5000 units every 8 hours subcutaneous Code Status: Full code Diet: Heart healthy/renal Family Communication: No Disposition Plan: Pending clinical course Consults called: Nephrology Admission status: Progressive cardiac, observation, with telemetry  Past Medical History:  Diagnosis Date  . Diabetes mellitus without complication ([place])   . Hypertension   . Renal disorder    Past Surgical History:  Procedure Laterality Date  . INSERTION OF DIALYSIS CATHETER    . LEG SURGERY  1987   Social History:  reports that he has never smoked. He has never used smokeless tobacco. He reports current alcohol use. He reports current drug use. Drug: Cocaine.  No Known Allergies Family History  Problem Relation Age of Onset  . Diabetes Mellitus II Sister   . Diabetes Mellitus II Paternal Grandmother    Family history: Family history reviewed and not pertinent  Prior to Admission medications   Medication Sig Start Date End Date Taking? Authorizing Provider  amLODipine (NORVASC) 10 MG tablet Take 1 tablet (10 mg total) by mouth daily. 09/01/18 10/01/18  Dustin Flock, MD  blood glucose meter kit and supplies KIT Dispense based on patient and insurance preference. Use up to four times daily as directed. (FOR ICD-9 250.00, 250.01). 09/01/18   Dustin Flock, MD   Physical Exam: Vitals:   02/04/21 1705 02/04/21 1709 02/04/21 1730 02/04/21 1830  BP:  (!) 185/102 (!) 168/101 (!) 172/93  Pulse:  (!) 102 100 (!) 105  Resp:  (!) 22 20 18   Temp:  98 F (36.7 C)    TempSrc:  Oral    SpO2:  100% 100% 100%  Weight: 63.5 kg     Height: 6' (1.829 m)      Constitutional: appears older than chronological age, NAD, calm, comfortable Eyes: PERRL, lids and conjunctivae normal ENMT: Mucous membranes are moist. Posterior pharynx clear of any exudate or lesions.  Poor dentition. Hearing appropriate Neck: normal, supple, no masses, no  thyromegaly Respiratory: clear to auscultation bilaterally, no wheezing, no crackles. Normal respiratory effort. No accessory muscle use.  Cardiovascular: Regular rate and rhythm, no murmurs / rubs / gallops.  Bilateral lower extremity trace edema. 2+ pedal pulses. No carotid bruits.  Abdomen: no tenderness, no masses palpated, no hepatosplenomegaly. Bowel sounds positive.  Musculoskeletal: no clubbing / cyanosis. No joint deformity upper and lower extremities. Good ROM, no contractures, no atrophy. Normal muscle tone.  Skin: no rashes, lesions, ulcers. No induration Neurologic: Sensation intact. Strength 5/5 in all 4.  Psychiatric: Normal judgment and insight. Alert and oriented x 3. Normal mood.   EKG: independently reviewed, showing sinus tachycardia with rate of 100, qtc 482, LVH  Chest x-ray on Admission: I personally reviewed and I agree with radiologist reading as below.  DG Chest Portable 1 View  Result Date: 02/04/2021 CLINICAL DATA:  Shortness of breath missed dialysis. EXAM: PORTABLE CHEST 1 VIEW COMPARISON:  None. FINDINGS: Right upper 0 chest dialysis catheter with tip overlying the superior cavoatrial junction. Borderline cardiac enlargement with central vascular prominence. No focal consolidation or overt pulmonary edema. No visible pleural effusion or pneumothorax. The visualized skeletal structures are unremarkable. IMPRESSION: Borderline cardiac enlargement with central vascular prominence. No overt pulmonary edema or pleural effusion. Electronically Signed   By: Dahlia Bailiff MD   On: 02/04/2021 17:46  Labs on Admission: I have personally reviewed following labs  CBC: Recent Labs  Lab 01/30/21 1205 02/04/21 1737  WBC 7.4 6.7  HGB 8.3* 7.4*  HCT 25.4* 22.7*  MCV 91.0 90.8  PLT 194 496   Basic Metabolic Panel: Recent Labs  Lab 01/30/21 1205 02/04/21 1737  NA 139 137  K 6.5* 6.3*  CL 112* 110  CO2 9* 9*  GLUCOSE 94 108*  BUN 132* 139*  CREATININE 19.51*  22.67*  CALCIUM 7.8* 7.5*   GFR: Estimated Creatinine Clearance: 3.1 mL/min (A) (by C-G formula based on SCr of 22.67 mg/dL (H)).  Urine analysis:    Component Value Date/Time   COLORURINE YELLOW (A) 08/30/2018 1810   APPEARANCEUR CLEAR (A) 08/30/2018 1810   LABSPEC 1.012 08/30/2018 1810   PHURINE 6.0 08/30/2018 1810   GLUCOSEU 150 (A) 08/30/2018 1810   HGBUR NEGATIVE 08/30/2018 1810   BILIRUBINUR NEGATIVE 08/30/2018 1810   KETONESUR NEGATIVE 08/30/2018 1810   PROTEINUR >=300 (A) 08/30/2018 1810   NITRITE NEGATIVE 08/30/2018 1810   LEUKOCYTESUR NEGATIVE 08/30/2018 1810   Velma Hanna N Roman Dubuc D.O. [facility]  If 7PM-7AM, please contact overnight-coverage provider If 7AM-7PM, please contact day coverage provider [email or web address]  02/04/2021, 7:08 PM

## 2021-02-04 NOTE — ED Triage Notes (Signed)
Pt here for SOB. Missed dialysis x 2 weeks. Was seen 20th and left AMA. Last dialysis 01/21/21.   Pt denies pain, N/V/D.

## 2021-02-04 NOTE — ED Notes (Signed)
FSBS checked and show hypoglycemia at 59. Pt still A&O. Pt provided with juice.

## 2021-02-05 DIAGNOSIS — E875 Hyperkalemia: Secondary | ICD-10-CM | POA: Diagnosis not present

## 2021-02-05 LAB — HIV ANTIBODY (ROUTINE TESTING W REFLEX): HIV Screen 4th Generation wRfx: NONREACTIVE

## 2021-02-05 LAB — BASIC METABOLIC PANEL
BUN: 132 mg/dL — ABNORMAL HIGH (ref 6–20)
CO2: <7 mmol/L — ABNORMAL LOW (ref 22–32)
Calcium: 7.4 mg/dL — ABNORMAL LOW (ref 8.9–10.3)
Chloride: 111 mmol/L (ref 98–111)
Creatinine, Ser: 22.69 mg/dL — ABNORMAL HIGH (ref 0.61–1.24)
GFR, Estimated: 2 mL/min — ABNORMAL LOW (ref >60)
Glucose, Bld: 104 mg/dL — ABNORMAL HIGH (ref 70–99)
Potassium: 5.5 mmol/L — ABNORMAL HIGH (ref 3.5–5.1)
Sodium: 138 mmol/L (ref 135–145)

## 2021-02-05 LAB — CBC
HCT: 23.1 % — ABNORMAL LOW (ref 39.0–52.0)
Hemoglobin: 7.7 g/dL — ABNORMAL LOW (ref 13.0–17.0)
MCH: 29.4 pg (ref 26.0–34.0)
MCHC: 33.3 g/dL (ref 30.0–36.0)
MCV: 88.2 fL (ref 80.0–100.0)
Platelets: 148 10*3/uL — ABNORMAL LOW (ref 150–400)
RBC: 2.62 MIL/uL — ABNORMAL LOW (ref 4.22–5.81)
RDW: 16.2 % — ABNORMAL HIGH (ref 11.5–15.5)
WBC: 6.5 10*3/uL (ref 4.0–10.5)
nRBC: 0 % (ref 0.0–0.2)

## 2021-02-05 MED ORDER — EPOETIN ALFA 4000 UNIT/ML IJ SOLN
4000.0000 [IU] | Freq: Once | INTRAMUSCULAR | Status: DC
  Filled 2021-02-05: qty 1

## 2021-02-05 MED ORDER — CHLORHEXIDINE GLUCONATE CLOTH 2 % EX PADS
6.0000 | MEDICATED_PAD | Freq: Every day | CUTANEOUS | Status: DC
  Filled 2021-02-05 (×2): qty 6

## 2021-02-05 NOTE — ED Notes (Signed)
Pt transported to Dialysis department.

## 2021-02-05 NOTE — Progress Notes (Signed)
Pt signed off HD tx early AMA with 1.25 hours left. Net UFR was 824. Pt c/o of GI upset. Refused bedpan. Did not wait for hospital transport to take back to ED. This RN removed IV in left arm and capped right chest perm cath with heparin. Pt left hospital from HD unit. Refused education. Refused hospital discharge. Candiss Norse, MD made aware.

## 2021-02-05 NOTE — ED Notes (Signed)
Pt resting with eyes closed. Call light within reach. Will continue to monitor for changes.

## 2021-02-05 NOTE — Progress Notes (Signed)
[facility], [place] 02/05/21  Subjective:   LOS: 0   Last HD was on 4/11 Patient states he missed treatment because his he was rescheduled during easter weekend. Afther that he continued to miss. When he went to outpatient HD yesterday he was told to come to ER for evaluation prior to HD. Found to have sever hyperkalemia and severe acidosis Also reports some shortness of breath Was treated with lokelma and shifting measures  Objective:  Vital signs in last 24 hours:  Temp:  [98 F (36.7 C)] 98 F (36.7 C) (04/25 1709) Pulse Rate:  [82-105] 82 (04/26 0438) Resp:  [12-22] 16 (04/26 0438) BP: (155-185)/(81-102) 155/91 (04/26 0438) SpO2:  [99 %-100 %] 99 % (04/26 0438) Weight:  [63.5 kg] 63.5 kg (04/25 1705)  Weight change:  Filed Weights   02/04/21 1705  Weight: 63.5 kg    Intake/Output:   No intake or output data in the 24 hours ending 02/05/21 0812   Physical Exam: General:  No acute distress, laying in the bed  HEENT  anicteric, moist oral mucous membrane  Pulm/lungs  normal breathing effort, lungs are clear to auscultation  CVS/Heart  regular rhythm, no rub or gallop  Abdomen:   Soft, nontender  Extremities:  1+ peripheral edema  Neurologic:  Alert, oriented, able to follow commands  Skin:  No acute rashes   Rt IJ PC     Basic Metabolic Panel:  Recent Labs  Lab 01/30/21 1205 02/04/21 1737 02/04/21 2303  NA 139 137 137  K 6.5* 6.3* 5.8*  CL 112* 110 110  CO2 9* 9* <7*  GLUCOSE 94 108* 101*  BUN 132* 139* 136*  CREATININE 19.51* 22.67* 22.72*  CALCIUM 7.8* 7.5* 7.5*  MG  --  2.0  --   PHOS  --  11.2*  --      CBC: Recent Labs  Lab 01/30/21 1205 02/04/21 1737  WBC 7.4 6.7  HGB 8.3* 7.4*  HCT 25.4* 22.7*  MCV 91.0 90.8  PLT 194 150      Lab Results  Component Value Date   HEPBSAG Negative 08/30/2018   HEPBSAB Non Reactive 08/30/2018   HEPBIGM Negative 08/30/2018      Microbiology:  Recent Results  (from the past 240 hour(s))  Resp Panel by RT-PCR (Flu A&B, Covid) Nasopharyngeal Swab     Status: None   Collection Time: 02/04/21  7:58 PM   Specimen: Nasopharyngeal Swab; Nasopharyngeal(NP) swabs in vial transport medium  Result Value Ref Range Status   SARS Coronavirus 2 by RT PCR NEGATIVE NEGATIVE Final    Comment: (NOTE) SARS-CoV-2 target nucleic acids are NOT DETECTED.  The SARS-CoV-2 RNA is generally detectable in upper respiratory specimens during the acute phase of infection. The lowest concentration of SARS-CoV-2 viral copies this assay can detect is 138 copies/mL. A negative result does not preclude SARS-Cov-2 infection and should not be used as the sole basis for treatment or other patient management decisions. A negative result may occur with  improper specimen collection/handling, submission of specimen other than nasopharyngeal swab, presence of viral mutation(s) within the areas targeted by this assay, and inadequate number of viral copies(<138 copies/mL). A negative result must be combined with clinical observations, patient history, and epidemiological information. The expected result is Negative.  Fact Sheet for Patients:  EntrepreneurPulse.com.au  Fact Sheet for Healthcare Providers:  IncredibleEmployment.be  This test is no t yet approved or cleared by the [place] and  has been authorized  for detection and/or diagnosis of SARS-CoV-2 by FDA under an Emergency Use Authorization (EUA). This EUA will remain  in effect (meaning this test can be used) for the duration of the COVID-19 declaration under Section 564(b)(1) of the Act, 21 U.S.C.section 360bbb-3(b)(1), unless the authorization is terminated  or revoked sooner.       Influenza A by PCR NEGATIVE NEGATIVE Final   Influenza B by PCR NEGATIVE NEGATIVE Final    Comment: (NOTE) The Xpert Xpress SARS-CoV-2/FLU/RSV plus assay is intended as an aid in the  diagnosis of influenza from Nasopharyngeal swab specimens and should not be used as a sole basis for treatment. Nasal washings and aspirates are unacceptable for Xpert Xpress SARS-CoV-2/FLU/RSV testing.  Fact Sheet for Patients: EntrepreneurPulse.com.au  Fact Sheet for Healthcare Providers: IncredibleEmployment.be  This test is not yet approved or cleared by the [place] FDA and has been authorized for detection and/or diagnosis of SARS-CoV-2 by FDA under an Emergency Use Authorization (EUA). This EUA will remain in effect (meaning this test can be used) for the duration of the COVID-19 declaration under Section 564(b)(1) of the Act, 21 U.S.C. section 360bbb-3(b)(1), unless the authorization is terminated or revoked.  Performed at [facility], [address] [place], [place] [address]     Coagulation Studies: No results for input(s): LABPROT, INR in the last 72 hours.  Urinalysis: No results for input(s): COLORURINE, LABSPEC, PHURINE, GLUCOSEU, HGBUR, BILIRUBINUR, KETONESUR, PROTEINUR, UROBILINOGEN, NITRITE, LEUKOCYTESUR in the last 72 hours.  Invalid input(s): APPERANCEUR    Imaging: DG Chest Portable 1 View  Result Date: 02/04/2021 CLINICAL DATA:  Shortness of breath missed dialysis. EXAM: PORTABLE CHEST 1 VIEW COMPARISON:  None. FINDINGS: Right upper 0 chest dialysis catheter with tip overlying the superior cavoatrial junction. Borderline cardiac enlargement with central vascular prominence. No focal consolidation or overt pulmonary edema. No visible pleural effusion or pneumothorax. The visualized skeletal structures are unremarkable. IMPRESSION: Borderline cardiac enlargement with central vascular prominence. No overt pulmonary edema or pleural effusion. Electronically Signed   By: Dahlia Bailiff MD   On: 02/04/2021 17:46     Medications:    . albuterol  2.5 mg Nebulization Q6H  . amLODipine  10 mg Oral Daily  .  heparin  5,000 Units Subcutaneous Q8H   acetaminophen **OR** acetaminophen, labetalol, ondansetron **OR** ondansetron (ZOFRAN) IV  Assessment/ Plan:  60 y.o. male with HTN, ESRD, DM-2, Hep C was admitted on 02/04/2021 for  Principal Problem:   Hyperkalemia Active Problems:   ESRD (end stage renal disease) (HCC)   Essential hypertension   Metabolic acidosis, increased anion gap   Anemia of chronic disease   Chronic hepatitis C without hepatic coma (HCC)  Hyperkalemia [E87.5]  [facility] MWF-2/[facility]/ TW 65.5 kg  #. Severe hyperkalemia and acidosis in setting of ESRD Plan for urgent HD today If tolerates fine, he can be discharged to home and can resume normal outpatient HD starting tomorrow  Acidosis and Potassium levels expected to improve with HD Acidosis is likely causing some shortness of breath  #. Anemia of CKD  Lab Results  Component Value Date   HGB 7.4 (L) 02/04/2021   Low dose EPO with HD  #. Secondary hyperparathyroidism of renal origin N 25.81      Component Value Date/Time   PTH 31 08/30/2018 1520   Lab Results  Component Value Date   PHOS 11.2 (H) 02/04/2021   Monitor calcium and phos level during this admission Encouraged to continue compliance with diet and  binders   #. Diabetes type 2 with CKD Hgb A1c MFr Bld (%)  Date Value  08/30/2018 5.5  HbA1c 4.8% in  12/2020    LOS: [place] 4/26/20228:12 [place], [place]

## 2021-02-05 NOTE — ED Notes (Signed)
Pt resting with eyes closed. Call light within reach. Will continue to monitor.

## 2021-02-05 NOTE — ED Notes (Signed)
Dialysis RN called to report that pt had completed dialysis but was not willing to wait to be transported back to ED. Pt was pending discharge once dialysis completed. IV removed by dialysis RN but pt did not receive AVS

## 2021-02-05 NOTE — Discharge Summary (Signed)
Physician Discharge Summary  Casey Franco:[id number] DOB: 1961/05/19 DOA: 02/04/2021  PCP: Patient, No Pcp Per (Inactive)  Admit date: 02/04/2021 Discharge date: 02/05/2021  Admitted From: Home Disposition: Home  Recommendations for Outpatient Follow-up:  1. Follow up with PCP in 1-2 weeks 2. Continue with scheduled dialysis starting from tomorrow 3. Please obtain BMP/CBC in one week 4. Please follow up on the following pending results: None  Home Health: No Equipment/Devices: None Discharge Condition: Stable CODE STATUS: Full Diet recommendation: Heart Healthy / Carb Modified   Brief/Interim Summary: Casey Franco is a 60 y.o. male with a past medical history of diabetes, hypertension, ESRD on HD who presents to the emergency department referred from his dialysis center.  According to the patient he was out of town on vacation this past week and missed dialysis for 1 week.  He states the dialysis center would not see him because he missed too many appointments so they sent him to the emergency department. Patient was found to be hyperkalemic with potassium of 6.3 on arrival-received IV Lasix and albuterol nebulizer treatment.  Subsequent potassium at 5.8 and he was taken for emergent dialysis. Patient will resume his scheduled dialysis from tomorrow.  Patient was also found to have metabolic acidosis with severely depressed bicarb after missing dialysis.  Nephrology will follow-up as an outpatient.  We will start him on bicarb replacement if needed.  He will continue rest of his home medications and follow-up with his providers.  Discharge Diagnoses:  Principal Problem:   Hyperkalemia Active Problems:   ESRD (end stage renal disease) ([place])   Essential hypertension   Metabolic acidosis, increased anion gap   Anemia of chronic disease   Chronic hepatitis C without hepatic coma [facility])    Discharge Instructions  Discharge Instructions    Diet - low sodium heart healthy    Complete by: As directed    Discharge instructions   Complete by: As directed    It was pleasure taking care of you. Please go for your scheduled dialysis tomorrow and follow your scheduled very strictly. Follow-up with your doctors.   Increase activity slowly   Complete by: As directed      Allergies as of 02/05/2021   No Known Allergies     Medication List    TAKE these medications   amLODipine 10 MG tablet Commonly known as: NORVASC Take 1 tablet by mouth daily.   blood glucose meter kit and supplies Kit Dispense based on patient and insurance preference. Use up to four times daily as directed. (FOR ICD-9 250.00, 250.01).   calcium acetate 667 MG capsule Commonly known as: PHOSLO Take 1,334 mg by mouth 3 (three) times daily.   furosemide 80 MG tablet Commonly known as: LASIX Take 80 mg by mouth daily.   hydrALAZINE 25 MG tablet Commonly known as: APRESOLINE Take 25 mg by mouth 3 (three) times daily.   irbesartan 300 MG tablet Commonly known as: AVAPRO Take 300 mg by mouth daily.   tamsulosin 0.4 MG Caps capsule Commonly known as: FLOMAX Take 0.4 mg by mouth daily.       No Known Allergies  Consultations:  Nephrology  Procedures/Studies: DG Chest Portable 1 View  Result Date: 02/04/2021 CLINICAL DATA:  Shortness of breath missed dialysis. EXAM: PORTABLE CHEST 1 VIEW COMPARISON:  None. FINDINGS: Right upper 0 chest dialysis catheter with tip overlying the superior cavoatrial junction. Borderline cardiac enlargement with central vascular prominence. No focal consolidation or overt pulmonary edema. No visible  pleural effusion or pneumothorax. The visualized skeletal structures are unremarkable. IMPRESSION: Borderline cardiac enlargement with central vascular prominence. No overt pulmonary edema or pleural effusion. Electronically Signed   By: Dahlia Bailiff MD   On: 02/04/2021 17:46     Subjective: Patient was seen and examined today.  Continues to have  some shortness of breath.  He was upset that he is in ED whole night and they have not taken him for dialysis. He promised not to miss any more dialysis.  Discharge Exam: Vitals:   02/05/21 1030 02/05/21 1100  BP: (!) 167/90 (!) 175/98  Pulse: 90 92  Resp: (!) 24 20  Temp:    SpO2: 100% 100%   Vitals:   02/05/21 0930 02/05/21 1000 02/05/21 1030 02/05/21 1100  BP: (!) 167/90 (!) 172/99 (!) 167/90 (!) 175/98  Pulse: 91 94 90 92  Resp: 20 (!) 21 (!) 24 20  Temp:      TempSrc:      SpO2: 100% 100% 100% 100%  Weight:      Height:        General: Pt is alert, awake, not in acute distress Cardiovascular: RRR, S1/S2 +, no rubs, no gallops Respiratory: CTA bilaterally, mildly tachypneic with increased work of breathing, before dialysis. Abdominal: Soft, NT, ND, bowel sounds + Extremities: no edema, no cyanosis   The results of significant diagnostics from this hospitalization (including imaging, microbiology, ancillary and laboratory) are listed below for reference.    Microbiology: Recent Results (from the past 240 hour(s))  Resp Panel by RT-PCR (Flu A&B, Covid) Nasopharyngeal Swab     Status: None   Collection Time: 02/04/21  7:58 PM   Specimen: Nasopharyngeal Swab; Nasopharyngeal(NP) swabs in vial transport medium  Result Value Ref Range Status   SARS Coronavirus 2 by RT PCR NEGATIVE NEGATIVE Final    Comment: (NOTE) SARS-CoV-2 target nucleic acids are NOT DETECTED.  The SARS-CoV-2 RNA is generally detectable in upper respiratory specimens during the acute phase of infection. The lowest concentration of SARS-CoV-2 viral copies this assay can detect is 138 copies/mL. A negative result does not preclude SARS-Cov-2 infection and should not be used as the sole basis for treatment or other patient management decisions. A negative result may occur with  improper specimen collection/handling, submission of specimen other than nasopharyngeal swab, presence of viral mutation(s)  within the areas targeted by this assay, and inadequate number of viral copies(<138 copies/mL). A negative result must be combined with clinical observations, patient history, and epidemiological information. The expected result is Negative.  Fact Sheet for Patients:  EntrepreneurPulse.com.au  Fact Sheet for Healthcare Providers:  IncredibleEmployment.be  This test is no t yet approved or cleared by the [place] FDA and  has been authorized for detection and/or diagnosis of SARS-CoV-2 by FDA under an Emergency Use Authorization (EUA). This EUA will remain  in effect (meaning this test can be used) for the duration of the COVID-19 declaration under Section 564(b)(1) of the Act, 21 U.S.C.section 360bbb-3(b)(1), unless the authorization is terminated  or revoked sooner.       Influenza A by PCR NEGATIVE NEGATIVE Final   Influenza B by PCR NEGATIVE NEGATIVE Final    Comment: (NOTE) The Xpert Xpress SARS-CoV-2/FLU/RSV plus assay is intended as an aid in the diagnosis of influenza from Nasopharyngeal swab specimens and should not be used as a sole basis for treatment. Nasal washings and aspirates are unacceptable for Xpert Xpress SARS-CoV-2/FLU/RSV testing.  Fact Sheet for Patients: EntrepreneurPulse.com.au  Fact  Sheet for Healthcare Providers: IncredibleEmployment.be  This test is not yet approved or cleared by the [place] and has been authorized for detection and/or diagnosis of SARS-CoV-2 by FDA under an Emergency Use Authorization (EUA). This EUA will remain in effect (meaning this test can be used) for the duration of the COVID-19 declaration under Section 564(b)(1) of the Act, 21 U.S.C. section 360bbb-3(b)(1), unless the authorization is terminated or revoked.  Performed at [facility], [address] [place],  [address]      Labs: BNP (last 3 results) Recent  Labs    02/04/21 1737  BNP 6,659.9*   Basic Metabolic Panel: Recent Labs  Lab 01/30/21 1205 02/04/21 1737 02/04/21 2303 02/05/21 0908  NA 139 137 137 138  K 6.5* 6.3* 5.8* 5.5*  CL 112* 110 110 111  CO2 9* 9* <7* <7*  GLUCOSE 94 108* 101* 104*  BUN 132* 139* 136* 132*  CREATININE 19.51* 22.67* 22.72* 22.69*  CALCIUM 7.8* 7.5* 7.5* 7.4*  MG  --  2.0  --   --   PHOS  --  11.2*  --   --    Liver Function Tests: No results for input(s): AST, ALT, ALKPHOS, BILITOT, PROT, ALBUMIN in the last 168 hours. No results for input(s): LIPASE, AMYLASE in the last 168 hours. No results for input(s): AMMONIA in the last 168 hours. CBC: Recent Labs  Lab 01/30/21 1205 02/04/21 1737 02/05/21 0908  WBC 7.4 6.7 6.5  HGB 8.3* 7.4* 7.7*  HCT 25.4* 22.7* 23.1*  MCV 91.0 90.8 88.2  PLT 194 150 148*   Cardiac Enzymes: No results for input(s): CKTOTAL, CKMB, CKMBINDEX, TROPONINI in the last 168 hours. BNP: Invalid input(s): POCBNP CBG: Recent Labs  Lab 02/04/21 2119 02/04/21 2301  GLUCAP 59* 108*   D-Dimer No results for input(s): DDIMER in the last 72 hours. Hgb A1c No results for input(s): HGBA1C in the last 72 hours. Lipid Profile No results for input(s): CHOL, HDL, LDLCALC, TRIG, CHOLHDL, LDLDIRECT in the last 72 hours. Thyroid function studies No results for input(s): TSH, T4TOTAL, T3FREE, THYROIDAB in the last 72 hours.  Invalid input(s): FREET3 Anemia work up No results for input(s): VITAMINB12, FOLATE, FERRITIN, TIBC, IRON, RETICCTPCT in the last 72 hours. Urinalysis    Component Value Date/Time   COLORURINE YELLOW (A) 08/30/2018 1810   APPEARANCEUR CLEAR (A) 08/30/2018 1810   LABSPEC 1.012 08/30/2018 1810   PHURINE 6.0 08/30/2018 1810   GLUCOSEU 150 (A) 08/30/2018 1810   HGBUR NEGATIVE 08/30/2018 1810   BILIRUBINUR NEGATIVE 08/30/2018 1810   KETONESUR NEGATIVE 08/30/2018 1810   PROTEINUR >=300 (A) 08/30/2018 1810   NITRITE NEGATIVE 08/30/2018 1810    LEUKOCYTESUR NEGATIVE 08/30/2018 1810   Sepsis Labs Invalid input(s): PROCALCITONIN,  WBC,  LACTICIDVEN Microbiology Recent Results (from the past 240 hour(s))  Resp Panel by RT-PCR (Flu A&B, Covid) Nasopharyngeal Swab     Status: None   Collection Time: 02/04/21  7:58 PM   Specimen: Nasopharyngeal Swab; Nasopharyngeal(NP) swabs in vial transport medium  Result Value Ref Range Status   SARS Coronavirus 2 by RT PCR NEGATIVE NEGATIVE Final    Comment: (NOTE) SARS-CoV-2 target nucleic acids are NOT DETECTED.  The SARS-CoV-2 RNA is generally detectable in upper respiratory specimens during the acute phase of infection. The lowest concentration of SARS-CoV-2 viral copies this assay can detect is 138 copies/mL. A negative result does not preclude SARS-Cov-2 infection and should not be used as the sole basis for treatment or other  patient management decisions. A negative result may occur with  improper specimen collection/handling, submission of specimen other than nasopharyngeal swab, presence of viral mutation(s) within the areas targeted by this assay, and inadequate number of viral copies(<138 copies/mL). A negative result must be combined with clinical observations, patient history, and epidemiological information. The expected result is Negative.  Fact Sheet for Patients:  EntrepreneurPulse.com.au  Fact Sheet for Healthcare Providers:  IncredibleEmployment.be  This test is no t yet approved or cleared by the [place] FDA and  has been authorized for detection and/or diagnosis of SARS-CoV-2 by FDA under an Emergency Use Authorization (EUA). This EUA will remain  in effect (meaning this test can be used) for the duration of the COVID-19 declaration under Section 564(b)(1) of the Act, 21 U.S.C.section 360bbb-3(b)(1), unless the authorization is terminated  or revoked sooner.       Influenza A by PCR NEGATIVE NEGATIVE Final   Influenza B  by PCR NEGATIVE NEGATIVE Final    Comment: (NOTE) The Xpert Xpress SARS-CoV-2/FLU/RSV plus assay is intended as an aid in the diagnosis of influenza from Nasopharyngeal swab specimens and should not be used as a sole basis for treatment. Nasal washings and aspirates are unacceptable for Xpert Xpress SARS-CoV-2/FLU/RSV testing.  Fact Sheet for Patients: EntrepreneurPulse.com.au  Fact Sheet for Healthcare Providers: IncredibleEmployment.be  This test is not yet approved or cleared by the [place] FDA and has been authorized for detection and/or diagnosis of SARS-CoV-2 by FDA under an Emergency Use Authorization (EUA). This EUA will remain in effect (meaning this test can be used) for the duration of the COVID-19 declaration under Section 564(b)(1) of the Act, 21 U.S.C. section 360bbb-3(b)(1), unless the authorization is terminated or revoked.  Performed at [facility], [address] [place], [place] [address]     Time coordinating discharge: Over 30 minutes  SIGNED:  Lorella Nimrod, MD  [facility] 02/05/2021, 1:06 PM  If 7PM-7AM, please contact night-coverage [email or web address]  This record has been created using Systems analyst. Errors have been sought and corrected,but may not always be located. Such creation errors do not reflect on the standard of care.

## 2021-10-25 ENCOUNTER — Other Ambulatory Visit
Admission: RE | Admit: 2021-10-25 | Discharge: 2021-10-25 | Disposition: A | Discharge status: Home / Self Care | Payer: Medicaid Other | Source: Ambulatory Visit | Attending: Internal Medicine | Admitting: Internal Medicine

## 2021-10-25 LAB — HEMOGLOBIN AND HEMATOCRIT, BLOOD
HCT: 19.9 % — ABNORMAL LOW (ref 39.0–52.0)
Hemoglobin: 6.3 g/dL — ABNORMAL LOW (ref 13.0–17.0)

## 2021-10-29 ENCOUNTER — Other Ambulatory Visit: Payer: Self-pay

## 2021-10-29 ENCOUNTER — Ambulatory Visit
Admission: RE | Admit: 2021-10-29 | Discharge: 2021-10-29 | Disposition: A | Discharge status: Home / Self Care | Payer: Medicaid Other | Source: Ambulatory Visit | Attending: Nephrology | Admitting: Nephrology

## 2021-10-29 VITALS — BP 180/98 | HR 91 | Temp 98.9°F | Resp 18 | Ht 71.0 in | Wt 150.0 lb

## 2021-10-29 DIAGNOSIS — D649 Anemia, unspecified: Secondary | ICD-10-CM | POA: Diagnosis not present

## 2021-10-29 DIAGNOSIS — D638 Anemia in other chronic diseases classified elsewhere: Secondary | ICD-10-CM

## 2021-10-29 LAB — HEMOGLOBIN AND HEMATOCRIT, BLOOD
HCT: 26.5 % — ABNORMAL LOW (ref 39.0–52.0)
Hemoglobin: 8.6 g/dL — ABNORMAL LOW (ref 13.0–17.0)

## 2021-10-29 MED ORDER — SODIUM CHLORIDE 0.9% IV SOLUTION: Freq: Once | INTRAVENOUS | Status: DC

## 2021-10-30 ENCOUNTER — Ambulatory Visit
Admission: RE | Admit: 2021-10-30 | Discharge: 2021-10-30 | Disposition: A | Discharge status: Home / Self Care | Payer: Medicaid Other | Source: Ambulatory Visit | Attending: Nephrology | Admitting: Nephrology

## 2021-10-30 ENCOUNTER — Ambulatory Visit (INDEPENDENT_AMBULATORY_CARE_PROVIDER_SITE_OTHER): Payer: Medicaid Other | Admitting: Gastroenterology

## 2021-10-30 ENCOUNTER — Other Ambulatory Visit: Payer: Self-pay

## 2021-10-30 ENCOUNTER — Encounter: Payer: Self-pay | Admitting: Gastroenterology

## 2021-10-30 VITALS — BP 186/104 | HR 99 | Temp 98.2°F | Ht 71.0 in | Wt 162.5 lb

## 2021-10-30 DIAGNOSIS — K921 Melena: Secondary | ICD-10-CM | POA: Insufficient documentation

## 2021-10-30 DIAGNOSIS — Z8719 Personal history of other diseases of the digestive system: Secondary | ICD-10-CM

## 2021-10-30 DIAGNOSIS — D649 Anemia, unspecified: Secondary | ICD-10-CM | POA: Insufficient documentation

## 2021-10-30 DIAGNOSIS — B182 Chronic viral hepatitis C: Secondary | ICD-10-CM

## 2021-10-30 LAB — PREPARE RBC (CROSSMATCH)

## 2021-10-30 MED ORDER — NA SULFATE-K SULFATE-MG SULF 17.5-3.13-1.6 GM/177ML PO SOLN: 354.0000 mL | Freq: Once | ORAL | 0 refills | Status: AC

## 2021-10-30 NOTE — Discharge Instructions (Signed)
Contact your physician who manages your dialysis if you are feeling more short of breath and do not feel that you can wait until your regularly scheduled dialysis on Friday.  Do not cut dialysis short on Friday unless suggested by the dialysis nurse or your provider.

## 2021-10-30 NOTE — Patient Instructions (Signed)
Your Ultrasound is schedule for 11/07/20 arrive at 8:30am at out patient imaging. Nothing to eat or drink after midnight. Address is [address], [place], [place] [address]. Phone number is [phone]

## 2021-10-30 NOTE — Progress Notes (Signed)
Cephas Darby, MD [address]  [place]  [place], [place] [address]  Main: [phone]  Fax: [phone]    Gastroenterology Consultation  Referring Provider:     [facility] [place] Physician:  [facility] [place] Primary Gastroenterologist:  Dr. Cephas Darby Reason for Consultation:     Anemia        HPI:   Casey Franco is a 61 y.o. male referred by Dr. Duard [facility] [place]  for consultation & management of anemia.  Patient has history of end-stage renal disease on hemodialysis, diabetes, hypertension is seen in consultation for anemia.  Patient reports that about 2 weeks ago, he had a 1 week history of black stools.  Currently, he denies any black stools.  He also reports that when he was taking oral iron, his stools were turning black.  He has not been on any iron supplements lately are 2 weeks ago.  Patient's hemoglobin was 6.35 days ago, received 1 unit of PRBCs yesterday and 1 unit today.  His hemoglobin was 8.6 yesterday.  He has history of anemia of chronic disease.  His hemoglobin dropped to 6.2, 1-year ago as well.  Patient has history of chronic hepatitis C, treatment nave, genotype 1b, no known history of cirrhosis.  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 Use up to four times daily as directed. (FOR ICD-9  250.00, 250.01)., Disp: 1 each, Rfl: 0   calcium acetate (PHOSLO) 667 MG capsule, Take 1,334 mg by mouth 3 (three) times daily., Disp: , Rfl:    furosemide (LASIX) 80 MG tablet, Take 80 mg by mouth daily., Disp: , Rfl:    hydrALAZINE (APRESOLINE) 25 MG tablet, Take 25 mg by mouth 3 (three) times daily., Disp: , Rfl:    irbesartan (AVAPRO) 300 MG tablet, Take 300 mg by mouth daily., Disp: , Rfl:    Na Sulfate-K Sulfate-Mg Sulf 17.5-3.13-1.6 GM/177ML SOLN, Take 354 mLs by mouth once for 1 dose., Disp: 354 mL, Rfl: 0   tamsulosin (FLOMAX) 0.4 MG CAPS capsule, Take 0.4 mg by mouth daily., Disp: , Rfl:   Family History  Problem Relation Age of Onset   Diabetes Mellitus II Sister    Diabetes Mellitus II Paternal Grandmother      Social History   Tobacco Use   Smoking status: Never   Smokeless tobacco: Never  Substance Use Topics   Alcohol use: Yes   Drug use: Yes    Types: Cocaine    Comment: Friday 08/27/2018    Allergies as of 10/30/2021   (No Known Allergies)    Review of Systems:    All systems reviewed and negative except where noted in HPI.   Physical Exam:  BP (!) 186/104 (BP Location: Left Arm, Patient Position: Sitting, Cuff Size: Normal)  Pulse 99    Temp 98.2 F (36.8 C) (Oral)    Ht 5' 11"  (1.803 m)    Wt 162 lb 8 oz (73.7 kg)    BMI 22.66 kg/m  No LMP for male patient.  General:   Alert,  Well-developed, well-nourished, pleasant and cooperative in NAD Head:  Normocephalic and atraumatic. Eyes:  Sclera clear, no icterus.   Conjunctiva pink. Ears:  Normal auditory acuity. Nose:  No deformity, discharge, or lesions. Mouth:  No deformity or lesions,oropharynx pink & moist. Neck:  Supple; no masses or thyromegaly. Lungs:  Respirations even and unlabored.  Clear throughout to auscultation.   No wheezes, crackles, or rhonchi. No acute distress. Heart:  Regular rate and rhythm; no murmurs, clicks, rubs, or gallops. Abdomen:  Normal bowel sounds. Soft, non-tender and  non-distended without masses, hepatosplenomegaly or hernias noted.  No guarding or rebound tenderness.   Rectal: Not performed Msk:  Symmetrical without gross deformities. Good, equal movement & strength bilaterally. Pulses:  Normal pulses noted. Extremities:  No clubbing or edema.  No cyanosis. Neurologic:  Alert and oriented x3;  grossly normal neurologically. Skin:  Intact without significant lesions or rashes. No jaundice. Lymph Nodes:  No significant cervical adenopathy. Psych:  Alert and cooperative. Normal mood and affect.  Imaging Studies: No abdominal imaging  Assessment and Plan:   Casey Franco is a 61 y.o. pleasant African-American male with history of hypertension, diabetes, ESRD on hemodialysis, chronic hep C genotype 1b, treatment nave with no known history of cirrhosis is seen in consultation for acute on chronic anemia and recent history of melena  Acute on chronic anemia: Patient received 2 units of PRBCs within last 48 hours Reported history of melena 2 weeks ago that lasted for 1 week Recommend EGD for further evaluation  Colon cancer screening Patient did not undergo screening colonoscopy Recommend colonoscopy and patient is agreeable  I have discussed alternative options, risks & benefits,  which include, but are not limited to, bleeding, infection, perforation,respiratory complication & drug reaction.  The patient agrees with this plan & written consent will be obtained.     Chronic hepatitis C, genotype 1b, treatment nave Recheck HCVRNA, reflex genotype Check CBC, CMP Check hepatitis B and A serologies Right upper quadrant ultrasound Anti-HCV treatment based on the above test results   Follow up in 3 months   Cephas Darby, MD

## 2021-10-31 LAB — BPAM RBC
Blood Product Expiration Date: 202301232359
Blood Product Expiration Date: 202302142359
Blood Product Expiration Date: 202302142359
ISSUE DATE / TIME: 202301170955
ISSUE DATE / TIME: 202301181054
Unit Type and Rh: 5100
Unit Type and Rh: 5100
Unit Type and Rh: 5100

## 2021-10-31 LAB — TYPE AND SCREEN
ABO/RH(D): O POS
Antibody Screen: NEGATIVE
Unit division: 0
Unit division: 0
Unit division: 0

## 2021-11-07 ENCOUNTER — Other Ambulatory Visit: Payer: Self-pay

## 2021-11-07 ENCOUNTER — Ambulatory Visit
Admission: RE | Admit: 2021-11-07 | Discharge: 2021-11-07 | Disposition: A | Discharge status: Home / Self Care | Payer: Medicaid Other | Source: Ambulatory Visit | Attending: Gastroenterology | Admitting: Gastroenterology

## 2021-11-07 DIAGNOSIS — B182 Chronic viral hepatitis C: Secondary | ICD-10-CM | POA: Insufficient documentation

## 2021-11-07 LAB — HCV RNA QUANT
HCV log10: 5.505 log10 IU/mL
Hepatitis C Quantitation: 320000 IU/mL

## 2021-11-07 LAB — HEPATITIS B SURFACE ANTIBODY,QUALITATIVE: Hep B Surface Ab, Qual: NONREACTIVE

## 2021-11-07 LAB — CBC
Hematocrit: 25.9 % — ABNORMAL LOW (ref 37.5–51.0)
Hemoglobin: 9 g/dL — ABNORMAL LOW (ref 13.0–17.7)
MCH: 31.8 pg (ref 26.6–33.0)
MCHC: 34.7 g/dL (ref 31.5–35.7)
MCV: 92 fL (ref 79–97)
Platelets: 145 10*3/uL — ABNORMAL LOW (ref 150–450)
RBC: 2.83 x10E6/uL — ABNORMAL LOW (ref 4.14–5.80)
RDW: 14.2 % (ref 11.6–15.4)
WBC: 5.8 10*3/uL (ref 3.4–10.8)

## 2021-11-07 LAB — COMPREHENSIVE METABOLIC PANEL
ALT: 31 IU/L (ref 0–44)
AST: 39 IU/L (ref 0–40)
Albumin/Globulin Ratio: 1.6 (ref 1.2–2.2)
Albumin: 4.2 g/dL (ref 3.8–4.8)
Alkaline Phosphatase: 126 IU/L — ABNORMAL HIGH (ref 44–121)
BUN/Creatinine Ratio: 4 — ABNORMAL LOW (ref 10–24)
BUN: 16 mg/dL (ref 8–27)
Bilirubin Total: 0.6 mg/dL (ref 0.0–1.2)
CO2: 23 mmol/L (ref 20–29)
Calcium: 8.8 mg/dL (ref 8.6–10.2)
Chloride: 102 mmol/L (ref 96–106)
Creatinine, Ser: 3.94 mg/dL — ABNORMAL HIGH (ref 0.76–1.27)
Globulin, Total: 2.6 g/dL (ref 1.5–4.5)
Glucose: 107 mg/dL — ABNORMAL HIGH (ref 70–99)
Potassium: 3.3 mmol/L — ABNORMAL LOW (ref 3.5–5.2)
Sodium: 142 mmol/L (ref 134–144)
Total Protein: 6.8 g/dL (ref 6.0–8.5)
eGFR: 17 mL/min/{1.73_m2} — ABNORMAL LOW (ref >59)

## 2021-11-07 LAB — HEPATITIS A ANTIBODY, TOTAL: hep A Total Ab: POSITIVE — AB

## 2021-11-07 LAB — HEPATITIS C GENOTYPE

## 2021-11-07 LAB — HEPATITIS B CORE ANTIBODY, TOTAL: Hep B Core Total Ab: POSITIVE — AB

## 2021-11-07 LAB — HEPATITIS B SURFACE ANTIGEN: Hepatitis B Surface Ag: NEGATIVE

## 2021-11-11 ENCOUNTER — Telehealth: Payer: Self-pay

## 2021-11-11 DIAGNOSIS — B182 Chronic viral hepatitis C: Secondary | ICD-10-CM

## 2021-11-11 NOTE — Telephone Encounter (Signed)
-----   Message from Lin Landsman, MD sent at 11/10/2021  2:00 PM EST ----- Caryl Pina  Please inform patient that he does not have cirrhosis of liver but he has fatty liver.  He still has active hepatitis C that needs to be treated.  If he would like to proceed.  Please let Ginger know and she will initiate the paperwork Chronic hep C, genotype 1b, treatment nave He is immune to hepatitis A and B  Thank you Rohini Vanga

## 2021-11-11 NOTE — Telephone Encounter (Signed)
Patient verbalized understanding of results. He is okay in starting the Hep C medication. We do need a Fibrosure lab test. Called [employer] to see if we can add on this lab work to the test they are supposed to fax and let us know if we can add on the lab

## 2021-11-13 NOTE — Telephone Encounter (Signed)
Faxed form to [employer] for Hep C medication

## 2021-11-14 ENCOUNTER — Telehealth: Payer: Self-pay

## 2021-11-14 NOTE — Telephone Encounter (Signed)
Insurance company denied patient Mavyret 100-40mg   medication. Can a Letter please be done so we can do appeal

## 2021-11-18 NOTE — Telephone Encounter (Signed)
Sent over for this medication this morning. Waiting to see if insurance covers this medication

## 2021-11-18 NOTE — Telephone Encounter (Signed)
Casey Franco  Can we try Epclusa 1 tablet once daily for 12 weeks?  RV

## 2021-11-21 ENCOUNTER — Encounter
Admission: RE | Disposition: A | Discharge status: Home / Self Care | Payer: Self-pay | Source: Home / Self Care | Attending: Gastroenterology

## 2021-11-21 ENCOUNTER — Ambulatory Visit: Payer: Medicaid Other | Admitting: Certified Registered"

## 2021-11-21 ENCOUNTER — Encounter: Payer: Self-pay | Admitting: Gastroenterology

## 2021-11-21 ENCOUNTER — Ambulatory Visit
Admission: RE | Admit: 2021-11-21 | Discharge: 2021-11-21 | Disposition: A | Discharge status: Home / Self Care | Payer: Medicaid Other | Attending: Gastroenterology | Admitting: Gastroenterology

## 2021-11-21 ENCOUNTER — Other Ambulatory Visit: Payer: Self-pay

## 2021-11-21 ENCOUNTER — Telehealth: Payer: Self-pay

## 2021-11-21 DIAGNOSIS — K2950 Unspecified chronic gastritis without bleeding: Secondary | ICD-10-CM | POA: Diagnosis not present

## 2021-11-21 DIAGNOSIS — E119 Type 2 diabetes mellitus without complications: Secondary | ICD-10-CM | POA: Insufficient documentation

## 2021-11-21 DIAGNOSIS — K5730 Diverticulosis of large intestine without perforation or abscess without bleeding: Secondary | ICD-10-CM | POA: Insufficient documentation

## 2021-11-21 DIAGNOSIS — K921 Melena: Secondary | ICD-10-CM | POA: Diagnosis not present

## 2021-11-21 DIAGNOSIS — K219 Gastro-esophageal reflux disease without esophagitis: Secondary | ICD-10-CM

## 2021-11-21 DIAGNOSIS — K644 Residual hemorrhoidal skin tags: Secondary | ICD-10-CM | POA: Diagnosis not present

## 2021-11-21 DIAGNOSIS — K635 Polyp of colon: Secondary | ICD-10-CM | POA: Diagnosis not present

## 2021-11-21 DIAGNOSIS — D124 Benign neoplasm of descending colon: Secondary | ICD-10-CM | POA: Diagnosis not present

## 2021-11-21 DIAGNOSIS — K31811 Angiodysplasia of stomach and duodenum with bleeding: Secondary | ICD-10-CM | POA: Insufficient documentation

## 2021-11-21 DIAGNOSIS — Z8719 Personal history of other diseases of the digestive system: Secondary | ICD-10-CM

## 2021-11-21 DIAGNOSIS — K319 Disease of stomach and duodenum, unspecified: Secondary | ICD-10-CM | POA: Diagnosis not present

## 2021-11-21 DIAGNOSIS — B9681 Helicobacter pylori [H. pylori] as the cause of diseases classified elsewhere: Secondary | ICD-10-CM | POA: Diagnosis not present

## 2021-11-21 DIAGNOSIS — D649 Anemia, unspecified: Secondary | ICD-10-CM

## 2021-11-21 DIAGNOSIS — Z1211 Encounter for screening for malignant neoplasm of colon: Secondary | ICD-10-CM | POA: Insufficient documentation

## 2021-11-21 DIAGNOSIS — K3189 Other diseases of stomach and duodenum: Secondary | ICD-10-CM | POA: Insufficient documentation

## 2021-11-21 DIAGNOSIS — I10 Essential (primary) hypertension: Secondary | ICD-10-CM | POA: Insufficient documentation

## 2021-11-21 DIAGNOSIS — Z992 Dependence on renal dialysis: Secondary | ICD-10-CM | POA: Insufficient documentation

## 2021-11-21 DIAGNOSIS — K449 Diaphragmatic hernia without obstruction or gangrene: Secondary | ICD-10-CM | POA: Insufficient documentation

## 2021-11-21 DIAGNOSIS — N289 Disorder of kidney and ureter, unspecified: Secondary | ICD-10-CM | POA: Diagnosis not present

## 2021-11-21 DIAGNOSIS — K31A19 Gastric intestinal metaplasia without dysplasia, unspecified site: Secondary | ICD-10-CM | POA: Insufficient documentation

## 2021-11-21 DIAGNOSIS — K5520 Angiodysplasia of colon without hemorrhage: Secondary | ICD-10-CM

## 2021-11-21 HISTORY — PX: COLONOSCOPY WITH PROPOFOL: SHX5780

## 2021-11-21 HISTORY — PX: ESOPHAGOGASTRODUODENOSCOPY (EGD) WITH PROPOFOL: SHX5813

## 2021-11-21 SURGERY — COLONOSCOPY WITH PROPOFOL
Anesthesia: General

## 2021-11-21 MED ORDER — LABETALOL HCL 5 MG/ML IV SOLN
INTRAVENOUS | Status: AC
  Filled 2021-11-21: qty 4

## 2021-11-21 MED ORDER — PROPOFOL 10 MG/ML IV BOLUS
INTRAVENOUS | Status: DC | PRN
Start: 2021-11-21 — End: 2021-11-21
  Administered 2021-11-21: 20 mg via INTRAVENOUS
  Administered 2021-11-21: 40 mg via INTRAVENOUS
  Administered 2021-11-21 (×2): 20 mg via INTRAVENOUS

## 2021-11-21 MED ORDER — PROPOFOL 500 MG/50ML IV EMUL
INTRAVENOUS | Status: AC
  Filled 2021-11-21: qty 50

## 2021-11-21 MED ORDER — DEXMEDETOMIDINE (PRECEDEX) IN NS 20 MCG/5ML (4 MCG/ML) IV SYRINGE
PREFILLED_SYRINGE | INTRAVENOUS | Status: DC | PRN
  Administered 2021-11-21: 8 ug via INTRAVENOUS
  Administered 2021-11-21: 4 ug via INTRAVENOUS

## 2021-11-21 MED ORDER — SODIUM CHLORIDE 0.9 % IV SOLN: INTRAVENOUS | Status: DC

## 2021-11-21 MED ORDER — PROPOFOL 500 MG/50ML IV EMUL
INTRAVENOUS | Status: DC | PRN
  Administered 2021-11-21: 125 ug/kg/min via INTRAVENOUS

## 2021-11-21 MED ORDER — LABETALOL HCL 5 MG/ML IV SOLN
INTRAVENOUS | Status: DC | PRN
Start: 2021-11-21 — End: 2021-11-21
  Administered 2021-11-21 (×2): 5 mg via INTRAVENOUS

## 2021-11-21 NOTE — Op Note (Signed)
[facility] Gastroenterology Patient Name: Casey Franco Procedure Date: 11/21/2021 8:16 AM MRN: [id number] Account #: [id number] Date of Birth: [birth year] Admit Type: Outpatient Age: 61 Room: [facility] ENDO ROOM 4 Gender: Male Note Status: Finalized Instrument Name: Upper Endoscope [id number] Procedure:             Upper GI endoscopy Indications:           Melena Providers:             Lin Landsman MD, MD Referring MD:          Bridget Hartshorn j. [facility], dr (Referring MD) Medicines:             General Anesthesia Complications:         No immediate complications. Estimated blood loss: None. Procedure:             Pre-Anesthesia Assessment:                        - Prior to the procedure, a History and Physical was                         performed, and patient medications and allergies were                         reviewed. The patient is competent. The risks and                         benefits of the procedure and the sedation options and                         risks were discussed with the patient. All questions                         were answered and informed consent was obtained.                         Patient identification and proposed procedure were                         verified by the physician, the nurse, the                         anesthesiologist, the anesthetist and the technician                         in the pre-procedure area in the procedure room in the                         endoscopy suite. Mental Status Examination: alert and                         oriented. Airway Examination: normal oropharyngeal                         airway and neck mobility. Respiratory Examination:                         clear to auscultation. CV Examination: normal.  Prophylactic Antibiotics: The patient does not require                         prophylactic antibiotics. Prior Anticoagulants: The                         patient has  taken no previous anticoagulant or                         antiplatelet agents. ASA Grade Assessment: III - A                         patient with severe systemic disease. After reviewing                         the risks and benefits, the patient was deemed in                         satisfactory condition to undergo the procedure. The                         anesthesia plan was to use general anesthesia.                         Immediately prior to administration of medications,                         the patient was re-assessed for adequacy to receive                         sedatives. The heart rate, respiratory rate, oxygen                         saturations, blood pressure, adequacy of pulmonary                         ventilation, and response to care were monitored                         throughout the procedure. The physical status of the                         patient was re-assessed after the procedure.                        After obtaining informed consent, the endoscope was                         passed under direct vision. Throughout the procedure,                         the patient's blood pressure, pulse, and oxygen                         saturations were monitored continuously. The Endoscope                         was introduced through the mouth, and advanced to the  second part of duodenum. The upper GI endoscopy was                         accomplished without difficulty. The patient tolerated                         the procedure fairly well. Findings:      A single 4 mm angioectasia with stigmata of recent bleeding was found in       the second portion of the duodenum. Coagulation for hemostasis using       argon plasma at 0.8 liters/minute and 50 watts was successful. Estimated       blood loss: none.      The duodenal bulb was normal.      Diffuse moderately erythematous mucosa without bleeding was found in the       entire examined  stomach. Biopsies were taken with a cold forceps for       histology.      A small hiatal hernia was present.      The gastroesophageal junction and examined esophagus were normal. Impression:            - A single recently bleeding angioectasia in the                         duodenum. Treated with argon plasma coagulation (APC).                        - Normal duodenal bulb.                        - Erythematous mucosa in the stomach. Biopsied.                        - Small hiatal hernia.                        - Normal gastroesophageal junction and esophagus. Recommendation:        - Await pathology results.                        - Follow an antireflux regimen indefinitely.                        - Use Prilosec (omeprazole) 20 mg PO BID.                        - Return to my office as previously scheduled.                        - Proceed with colonoscopy as scheduled                        See colonoscopy report Procedure Code(s):     --- Professional ---                        13244, 59, Esophagogastroduodenoscopy, flexible,                         transoral; with control of bleeding, any method  85929, Esophagogastroduodenoscopy, flexible,                         transoral; with biopsy, single or multiple Diagnosis Code(s):     --- Professional ---                        K31.811, Angiodysplasia of stomach and duodenum with                         bleeding                        K31.89, Other diseases of stomach and duodenum                        K44.9, Diaphragmatic hernia without obstruction or                         gangrene                        K92.1, Melena (includes Hematochezia) CPT copyright 2019 American Medical Association. All rights reserved. The codes documented in this report are preliminary and upon coder review may  be revised to meet current compliance requirements. Dr. Ulyess Mort Lin Landsman MD, MD 11/21/2021 8:54:31 AM This  report has been signed electronically. Number of Addenda: 0 Note Initiated On: 11/21/2021 8:16 AM Estimated Blood Loss:  Estimated blood loss: none.      [facility]

## 2021-11-21 NOTE — Op Note (Signed)
[facility] Gastroenterology Patient Name: Casey Franco Procedure Date: 11/21/2021 8:16 AM MRN: [id number] Account #: [id number] Date of Birth: [birth year] Admit Type: Outpatient Age: 61 Room: [place] ENDO ROOM 4 Gender: Male Note Status: Finalized Instrument Name: Park Meo [id number] Procedure:             Colonoscopy Indications:           Screening for colorectal malignant neoplasm Providers:             Lin Landsman MD, MD Referring MD:          Bridget Hartshorn j. [facility], dr (Referring MD) Medicines:             General Anesthesia Complications:         No immediate complications. Estimated blood loss: None. Procedure:             Pre-Anesthesia Assessment:                        - Prior to the procedure, a History and Physical was                         performed, and patient medications and allergies were                         reviewed. The patient is competent. The risks and                         benefits of the procedure and the sedation options and                         risks were discussed with the patient. All questions                         were answered and informed consent was obtained.                         Patient identification and proposed procedure were                         verified by the physician, the nurse, the                         anesthesiologist, the anesthetist and the technician                         in the pre-procedure area in the procedure room in the                         endoscopy suite. Mental Status Examination: alert and                         oriented. Airway Examination: normal oropharyngeal                         airway and neck mobility. Respiratory Examination:                         clear to auscultation. CV Examination: normal.  Prophylactic Antibiotics: The patient does not require                         prophylactic antibiotics. Prior Anticoagulants: The                          patient has taken no previous anticoagulant or                         antiplatelet agents. ASA Grade Assessment: III - A                         patient with severe systemic disease. After reviewing                         the risks and benefits, the patient was deemed in                         satisfactory condition to undergo the procedure. The                         anesthesia plan was to use general anesthesia.                         Immediately prior to administration of medications,                         the patient was re-assessed for adequacy to receive                         sedatives. The heart rate, respiratory rate, oxygen                         saturations, blood pressure, adequacy of pulmonary                         ventilation, and response to care were monitored                         throughout the procedure. The physical status of the                         patient was re-assessed after the procedure.                        After obtaining informed consent, the colonoscope was                         passed under direct vision. Throughout the procedure,                         the patient's blood pressure, pulse, and oxygen                         saturations were monitored continuously. The                         Colonoscope was introduced through the anus and  advanced to the the cecum, identified by appendiceal                         orifice and ileocecal valve. The colonoscopy was                         performed without difficulty. The patient tolerated                         the procedure well. The quality of the bowel                         preparation was evaluated using the BBPS [facility] Bowel                         Preparation Scale) with scores of: Right Colon = 3,                         Transverse Colon = 3 and Left Colon = 3 (entire mucosa                         seen well with no residual staining, small  fragments                         of stool or opaque liquid). The total BBPS score                         equals 9. Findings:      The perianal and digital rectal examinations were normal. Pertinent       negatives include normal sphincter tone and no palpable rectal lesions.      Hemorrhoids were found on perianal exam.      A 5 mm polyp was found in the descending colon. The polyp was sessile.       The polyp was removed with a cold snare. Resection and retrieval were       complete. Estimated blood loss: none.      Multiple diverticula were found in the sigmoid colon.      Non-bleeding external hemorrhoids were found during retroflexion. The       hemorrhoids were medium-sized. Impression:            - Hemorrhoids found on perianal exam.                        - One 5 mm polyp in the descending colon, removed with                         a cold snare. Resected and retrieved.                        - Diverticulosis in the sigmoid colon.                        - Non-bleeding external hemorrhoids. Recommendation:        - Discharge patient to home (with escort).                        - Resume previous diet today.                        -  Continue present medications.                        - Await pathology results.                        - Repeat colonoscopy in 7-10 years for surveillance                         based on pathology results.                        - Return to my office as previously scheduled. Procedure Code(s):     --- Professional ---                        [id number], Colonoscopy, flexible; with removal of                         tumor(s), polyp(s), or other lesion(s) by snare                         technique Diagnosis Code(s):     --- Professional ---                        Z12.11, Encounter for screening for malignant neoplasm                         of colon                        K64.4, Residual hemorrhoidal skin tags                        K63.5, Polyp of  colon                        K57.30, Diverticulosis of large intestine without                         perforation or abscess without bleeding CPT copyright 2019 American Medical Association. All rights reserved. The codes documented in this report are preliminary and upon coder review may  be revised to meet current compliance requirements. Dr. Ulyess Mort Lin Landsman MD, MD 11/21/2021 9:10:28 AM This report has been signed electronically. Number of Addenda: 0 Note Initiated On: 11/21/2021 8:16 AM Scope Withdrawal Time: 0 hours 8 minutes 16 seconds  Total Procedure Duration: 0 hours 11 minutes 19 seconds  Estimated Blood Loss:  Estimated blood loss: none.      [facility]

## 2021-11-21 NOTE — Anesthesia Postprocedure Evaluation (Signed)
Anesthesia Post Note  Patient: Casey Franco  Procedure(s) Performed: COLONOSCOPY WITH PROPOFOL ESOPHAGOGASTRODUODENOSCOPY (EGD) WITH PROPOFOL  Patient location during evaluation: Endoscopy Anesthesia Type: General Level of consciousness: awake and alert Pain management: pain level controlled Vital Signs Assessment: post-procedure vital signs reviewed and stable Respiratory status: spontaneous breathing, nonlabored ventilation, respiratory function stable and patient connected to nasal cannula oxygen Cardiovascular status: blood pressure returned to baseline and stable Postop Assessment: no apparent nausea or vomiting Anesthetic complications: no   No notable events documented.   Last Vitals:  Vitals:   11/21/21 0922 11/21/21 0932  BP: (!) 178/99 (!) 182/103  Pulse:    Resp:  (!) 21  Temp:    SpO2: 98%     Last Pain:  Vitals:   11/21/21 0932  TempSrc:   PainSc: 0-No pain                 Precious Haws Valaree Fresquez

## 2021-11-21 NOTE — H&P (Signed)
Cephas Darby, MD [address]  [place] [address]  Main: [phone]  Fax: [phone] Pager: [phone]  Primary Care Physician:  [facility] Primary Gastroenterologist:  Dr. Cephas Darby  Pre-Procedure History & Physical: HPI:  Casey Franco is a 61 y.o. male is here for an endoscopy and colonoscopy.   Past Medical History:  Diagnosis Date   AKI (acute kidney injury) ([place]) 08/30/2018   Diabetes mellitus without complication ([place])    Hyperkalemia 02/04/2021   Hypertension    Metabolic acidosis, increased anion gap 02/04/2021   Renal disorder     Past Surgical History:  Procedure Laterality Date   INSERTION OF DIALYSIS CATHETER     LEG SURGERY  1987    Prior to Admission medications   Medication Sig Start Date End Date Taking? Authorizing Provider  amLODipine (NORVASC) 10 MG tablet Take 1 tablet by mouth daily. 05/18/20   [provider]  blood glucose meter kit and supplies KIT Dispense based on patient and insurance preference. Use up to four times daily as directed. (FOR ICD-9 250.00, 250.01). 09/01/18   Dustin Flock, MD  calcium acetate (PHOSLO) 667 MG capsule Take 1,334 mg by mouth 3 (three) times daily. 02/04/21   [provider]  furosemide (LASIX) 80 MG tablet Take 80 mg by mouth daily. 01/25/21   [provider]  hydrALAZINE (APRESOLINE) 25 MG tablet Take 25 mg by mouth 3 (three) times daily. 01/25/21   [provider]  irbesartan (AVAPRO) 300 MG tablet Take 300 mg by mouth daily. 02/04/21   [provider]  tamsulosin (FLOMAX) 0.4 MG CAPS capsule Take 0.4 mg by mouth daily. 01/30/21   [provider]    Allergies as of 10/31/2021   (No Known Allergies)    Family History  Problem Relation Age of Onset   Diabetes Mellitus II Sister    Diabetes Mellitus II Paternal Grandmother     Social History   Socioeconomic History   Marital status: Single     Spouse name: Not on file   Number of children: Not on file   Years of education: Not on file   Highest education level: Not on file  Occupational History   Not on file  Tobacco Use   Smoking status: Never   Smokeless tobacco: Never  Vaping Use   Vaping Use: Never used  Substance and Sexual Activity   Alcohol use: Yes    Alcohol/week: 2.0 standard drinks    Types: 2 Glasses of wine per week   Drug use: Yes    Types: Cocaine    Comment: Friday 08/27/2018   Sexual activity: Not on file  Other Topics Concern   Not on file  Social History Narrative   Not on file   Social Determinants of Health   Financial Resource Strain: Not on file  Food Insecurity: Not on file  Transportation Needs: Not on file  Physical Activity: Not on file  Stress: Not on file  Social Connections: Not on file  Intimate Partner Violence: Not on file    Review of Systems: See HPI, otherwise negative ROS  Physical Exam: BP (!) 206/111    Pulse 87    Temp 98 F (36.7 C) (Temporal)    Resp 20    Ht 5' 11"  (1.803 m)    Wt 72.6 kg    SpO2 100%    BMI 22.32 kg/m  General:   Alert,  pleasant and  cooperative in NAD Head:  Normocephalic and atraumatic. Neck:  Supple; no masses or thyromegaly. Lungs:  Clear throughout to auscultation.    Heart:  Regular rate and rhythm. Abdomen:  Soft, nontender and nondistended. Normal bowel sounds, without guarding, and without rebound.   Neurologic:  Alert and  oriented x4;  grossly normal neurologically.  Impression/Plan: Casey Franco is here for an endoscopy and colonoscopy to be performed for melena, colon cancer screening  Risks, benefits, limitations, and alternatives regarding  endoscopy and colonoscopy have been reviewed with the patient.  Questions have been answered.  All parties agreeable.   Sherri Sear, MD  11/21/2021, 8:14 AM

## 2021-11-21 NOTE — Telephone Encounter (Signed)
Patient has been approved for Mavyret medication

## 2021-11-21 NOTE — Anesthesia Preprocedure Evaluation (Signed)
Anesthesia Evaluation  Patient identified by MRN, date of birth, ID band Patient awake    Reviewed: Allergy & Precautions, NPO status , Patient's Chart, lab work & pertinent test results  History of Anesthesia Complications Negative for: history of anesthetic complications  Airway Mallampati: III  TM Distance: >3 FB Neck ROM: full    Dental  (+) Chipped, Poor Dentition, Missing, Loose   Pulmonary neg pulmonary ROS, neg shortness of breath,    Pulmonary exam normal        Cardiovascular Exercise Tolerance: Good hypertension, (-) anginaNormal cardiovascular exam     Neuro/Psych negative neurological ROS  negative psych ROS   GI/Hepatic negative GI ROS, (+) Hepatitis -  Endo/Other  diabetes, Type 2  Renal/GU DialysisRenal disease  negative genitourinary   Musculoskeletal   Abdominal   Peds  Hematology negative hematology ROS (+)   Anesthesia Other Findings Past Medical History: 08/30/2018: AKI (acute kidney injury) ([place]) No date: Diabetes mellitus without complication ([place]) 1/61/0960: Hyperkalemia No date: Hypertension 4/54/0981: Metabolic acidosis, increased anion gap No date: Renal disorder  Past Surgical History: No date: INSERTION OF DIALYSIS CATHETER 1987: LEG SURGERY  BMI    Body Mass Index: 22.32 kg/m      Reproductive/Obstetrics negative OB ROS                             Anesthesia Physical Anesthesia Plan  ASA: 3  Anesthesia Plan: General   Post-op Pain Management:    Induction: Intravenous  PONV Risk Score and Plan: Propofol infusion and TIVA  Airway Management Planned: Natural Airway and Nasal Cannula  Additional Equipment:   Intra-op Plan:   Post-operative Plan:   Informed Consent: I have reviewed the patients History and Physical, chart, labs and discussed the procedure including the risks, benefits and alternatives for the proposed anesthesia with  the patient or authorized representative who has indicated his/her understanding and acceptance.     Dental Advisory Given  Plan Discussed with: Anesthesiologist, CRNA and Surgeon  Anesthesia Plan Comments: (Patient consented for risks of anesthesia including but not limited to:  - adverse reactions to medications - risk of airway placement if required - damage to eyes, teeth, lips or other oral mucosa - nerve damage due to positioning  - sore throat or hoarseness - Damage to heart, brain, nerves, lungs, other parts of body or loss of life  Patient voiced understanding.)        Anesthesia Quick Evaluation

## 2021-11-21 NOTE — Transfer of Care (Signed)
Immediate Anesthesia Transfer of Care Note  Patient: Casey Franco  Procedure(s) Performed: COLONOSCOPY WITH PROPOFOL ESOPHAGOGASTRODUODENOSCOPY (EGD) WITH PROPOFOL  Patient Location: PACU and Endoscopy Unit  Anesthesia Type:General  Level of Consciousness: drowsy and patient cooperative  Airway & Oxygen Therapy: Patient Spontanous Breathing and Patient connected to nasal cannula oxygen  Post-op Assessment: Report given to RN and Post -op Vital signs reviewed and stable  Post vital signs: Reviewed and stable  Last Vitals:  Vitals Value Taken Time  BP 173/93 11/21/21 0912  Temp    Pulse 74 11/21/21 0913  Resp 22 11/21/21 0913  SpO2 100 % 11/21/21 0913  Vitals shown include unvalidated device data.  Last Pain:  Vitals:   11/21/21 0810  TempSrc: Temporal  PainSc: 0-No pain         Complications: No notable events documented.

## 2021-11-22 ENCOUNTER — Encounter: Payer: Self-pay | Admitting: Gastroenterology

## 2021-11-22 LAB — SURGICAL PATHOLOGY

## 2021-11-25 ENCOUNTER — Encounter: Payer: Self-pay | Admitting: Gastroenterology

## 2021-11-25 ENCOUNTER — Telehealth: Payer: Self-pay

## 2021-11-25 DIAGNOSIS — A048 Other specified bacterial intestinal infections: Secondary | ICD-10-CM

## 2021-11-25 MED ORDER — AMOXICILLIN 500 MG PO TABS: 500.0000 mg | ORAL_TABLET | Freq: Two times a day (BID) | ORAL | 0 refills | Status: AC

## 2021-11-25 MED ORDER — OMEPRAZOLE 20 MG PO CPDR: 20.0000 mg | DELAYED_RELEASE_CAPSULE | Freq: Two times a day (BID) | ORAL | 0 refills | Status: DC

## 2021-11-25 MED ORDER — CLARITHROMYCIN 500 MG PO TABS: 500.0000 mg | ORAL_TABLET | Freq: Every day | ORAL | 0 refills | Status: AC

## 2021-11-25 NOTE — Telephone Encounter (Signed)
-----   Message from Lin Landsman, MD sent at 11/25/2021  4:31 PM EST ----- Please inform patient that the pathology results from upper endoscopy came back positive for Helicobacter pylori infection.  Recommend triple therapy to treat H Pylori for 14days.  He has history of end-stage renal disease, therefore his antibiotics are renally dosed.  Also, patient should not start [place] until he finishes the treatment for H. Pylori.  If he has already started Mavyret, we can wait to treat H. pylori until he finishes the course of Mavyret   Omeprazole 20mg  BID Clarithromycin 500mg  once daily Amoxicillin 500mg  BID  Order H Pylori breath test in 4weeks after completing medication to confirm eradication. She should be off prilosec and H2 blocker atleast for 2weeks before the test  Thanks RV

## 2021-11-25 NOTE — Telephone Encounter (Signed)
Patient verbalized understanding of results. He states he has not started the Sabin yet. He understands not to start the medications till he is done with the antibiotics

## 2021-11-29 ENCOUNTER — Telehealth: Payer: Self-pay

## 2021-11-29 NOTE — Telephone Encounter (Signed)
Patient is approved through [place] for Mvyret through 01/24/22 for a 4 dollar copay

## 2021-12-03 ENCOUNTER — Telehealth: Payer: Self-pay

## 2021-12-03 LAB — HCV FIBROSURE
ALPHA 2-MACROGLOBULINS, QN: 236 mg/dL (ref 110–276)
ALT (SGPT) P5P: 38 IU/L (ref 0–55)
Apolipoprotein A-1: 165 mg/dL (ref 101–178)
Bilirubin, Total: 0.4 mg/dL (ref 0.0–1.2)
Fibrosis Score: 0.51 — ABNORMAL HIGH (ref 0.00–0.21)
GGT: 166 IU/L — ABNORMAL HIGH (ref 0–65)
Haptoglobin: 81 mg/dL (ref 32–363)
Necroinflammat Activity Score: 0.26 — ABNORMAL HIGH (ref 0.00–0.17)

## 2021-12-03 LAB — SPECIMEN STATUS REPORT

## 2021-12-03 NOTE — Telephone Encounter (Signed)
Spoke with [facility] regarding Casey Franco's Mavyret. Pt stated he didn't need them to fill this because he was getting this from somewhere else. Please contact [facility] if this true so they may cancel the order and close his account.  [phone] [facility]

## 2021-12-03 NOTE — Telephone Encounter (Signed)
Patient though the H pylori medication was for his Hep C informed Patient that the medications he received from the pharmacy was for H pylori the bacteria in his stomach. Informed him that he still needed the Hep c medications. He states he is okay with them shipping the medications. Called [employer] and informed them of this information and she will reach back out to the patient

## 2021-12-17 DIAGNOSIS — N185 Chronic kidney disease, stage 5: Secondary | ICD-10-CM | POA: Insufficient documentation

## 2022-01-06 ENCOUNTER — Telehealth: Payer: Self-pay | Admitting: Gastroenterology

## 2022-01-06 NOTE — Telephone Encounter (Signed)
Medical records were requested by [place] were mailed out on 01/06/2022  records were requested for DOS 11/21/2021 ?

## 2022-02-03 ENCOUNTER — Other Ambulatory Visit: Payer: Self-pay

## 2022-02-03 ENCOUNTER — Ambulatory Visit (INDEPENDENT_AMBULATORY_CARE_PROVIDER_SITE_OTHER): Payer: Medicaid Other | Admitting: Gastroenterology

## 2022-02-03 ENCOUNTER — Encounter: Payer: Self-pay | Admitting: Gastroenterology

## 2022-02-03 VITALS — BP 192/96 | HR 85 | Temp 98.3°F | Ht 71.0 in | Wt 154.1 lb

## 2022-02-03 DIAGNOSIS — B182 Chronic viral hepatitis C: Secondary | ICD-10-CM

## 2022-02-03 DIAGNOSIS — D649 Anemia, unspecified: Secondary | ICD-10-CM

## 2022-02-03 DIAGNOSIS — A048 Other specified bacterial intestinal infections: Secondary | ICD-10-CM | POA: Diagnosis not present

## 2022-02-03 NOTE — Progress Notes (Signed)
?  ?Cephas Darby, MD ?[address]  ?[address]  ?[place], [place] [address]  ?Main: [phone]  ?Fax: [phone] ? ? ? ?Gastroenterology Consultation ? ?[facility]erring [facility]:     [facility] [place] [facility]  [facility] [place] ?Primary Gastroenterologist:  Dr. Cephas Darby ?Reason for Consultation:    H. pylori infection, chronic hep C ?      ? HPI:   ?Casey Franco is a 61 y.o. male referred by Dr. Duard [facility] [place]  for consultation & management of anemia.  Patient has history of end-stage renal disease on hemodialysis, diabetes, hypertension is seen in consultation for anemia.  Patient reports that about 2 weeks ago, he had a 1 week history of black stools.  Currently, he denies any black stools.  He also reports that when he was taking oral iron, his stools were turning black.  He has not been on any iron supplements lately are 2 weeks ago.  Patient's hemoglobin was 6.35 days ago, received 1 unit of PRBCs yesterday and 1 unit today.  His hemoglobin was 8.6 yesterday.  He has history of anemia of chronic disease.  His hemoglobin dropped to 6.2, 1-year ago as well. ? ?Patient has history of chronic hepatitis C, treatment na?ve, genotype 1b, no known history of cirrhosis.  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 ?Follow-up visit 02/03/2022 ?Patient is here for follow-up of H. pylori infection as well as chronic hep C.  Patient finished triple therapy.  He reports that his GI symptoms have completely resolved and his appetite has improved.  He is on [place] for chronic hep C, will be finishing on Wednesday this week.  He does not have any GI concerns today.  Patient ate 20 minutes ago ? ?NSAIDs: None ? ?Antiplts/Anticoagulants/Anti thrombotics: None ? ?GI Procedures: EGD and colonoscopy 11/21/2021 ?H. pylori gastritis, small tubular adenoma of the colon ? ?Father with lung cancer, mother with liver cancer ? ?Past Medical  History:  ?Diagnosis Date  ? AKI (acute kidney injury) ([place]) 08/30/2018  ? Diabetes mellitus without complication ([place])   ? Hyperkalemia 02/04/2021  ? Hypertension   ? Metabolic acidosis, increased anion gap 02/04/2021  ? Renal disorder   ? ? ?Past Surgical History:  ?Procedure Laterality Date  ? COLONOSCOPY WITH PROPOFOL N/A 11/21/2021  ? Procedure: COLONOSCOPY WITH PROPOFOL;  Surgeon: Lin Landsman, MD;  Location: [facility] ENDOSCOPY;  Service: Gastroenterology;  Laterality: N/A;  ? ESOPHAGOGASTRODUODENOSCOPY (EGD) WITH PROPOFOL N/A 11/21/2021  ? Procedure: ESOPHAGOGASTRODUODENOSCOPY (EGD) WITH PROPOFOL;  Surgeon: Lin Landsman, MD;  Location: [facility] ENDOSCOPY;  Service: Gastroenterology;  Laterality: N/A;  ? INSERTION OF DIALYSIS CATHETER    ? LEG SURGERY  1987  ? ?Current Outpatient Medications:  ?  amLODipine (NORVASC) 10 MG tablet, Take 1 tablet by mouth daily., Disp: , Rfl:  ?  blood glucose meter kit and supplies KIT, Dispense based on patient and insurance preference. Use up to four times daily as directed. (FOR ICD-9 250.00, 250.01)., Disp: 1 each, Rfl: 0 ?  calcium acetate (PHOSLO) 667 MG capsule, Take 1,334 mg by mouth 3 (three) times daily., Disp: , Rfl:  ?  furosemide (LASIX) 80 MG tablet, Take 80 mg by mouth daily., Disp: , Rfl:  ?  hydrALAZINE (APRESOLINE) 25 MG tablet, Take 25 mg by mouth 3 (three) times daily., Disp: , Rfl:  ?  irbesartan (AVAPRO) 300 MG tablet, Take 300 mg by mouth  daily., Disp: , Rfl:  ?  MAVYRET 100-40 MG TABS, Take 3 tablets by mouth daily., Disp: , Rfl:  ?  omeprazole (PRILOSEC) 20 MG capsule, Take 1 capsule (20 mg total) by mouth 2 (two) times daily before a meal for 14 days., Disp: 28 capsule, Rfl: 0 ?  tamsulosin (FLOMAX) 0.4 MG CAPS capsule, Take 0.4 mg by mouth daily., Disp: , Rfl:  ? ?Family History  ?Problem Relation Age of Onset  ? Diabetes Mellitus II Sister   ? Diabetes Mellitus II Paternal Grandmother   ?  ? ?Social History  ? ?Tobacco Use  ? Smoking status: Never  ?  Smokeless tobacco: Never  ?Vaping Use  ? Vaping Use: Never used  ?Substance Use Topics  ? Alcohol use: Yes  ?  Alcohol/week: 2.0 standard drinks  ?  Types: 2 Glasses of wine per week  ? Drug use: Yes  ?  Types: Cocaine  ?  Comment: Friday 08/27/2018  ? ? ?Allergies as of 02/03/2022  ? (No Known Allergies)  ? ? ?Review of Systems:    ?All systems reviewed and negative except where noted in HPI. ? ? Physical Exam:  ?BP (!) 192/96 (BP Location: Left Arm, Patient Position: Sitting, Cuff Size: Normal)   Pulse 85   Temp 98.3 ?F (36.8 ?C) (Oral)   Ht _0  (1.803 m)   Wt 154 lb 2 oz (69.9 kg)   BMI 21.50 kg/m?  ?No LMP for male patient. ? ?General:   Alert,  Well-developed, well-nourished, pleasant and cooperative in NAD ?Head:  Normocephalic and atraumatic. ?Eyes:  Sclera clear, no icterus.   Conjunctiva pink. ?Ears:  Normal auditory acuity. ?Nose:  No deformity, discharge, or lesions. ?Mouth:  No deformity or lesions,oropharynx pink & moist. ?Neck:  Supple; no masses or thyromegaly. ?Lungs:  Respirations even and unlabored.  Clear throughout to auscultation.   No wheezes, crackles, or rhonchi. No acute distress. ?Heart:  Regular rate and rhythm; no murmurs, clicks, rubs, or gallops. ?Abdomen:  Normal bowel sounds. Soft, non-tender and non-distended without masses, hepatosplenomegaly or hernias noted.  No guarding or rebound tenderness.   ?Rectal: Not performed ?Msk:  Symmetrical without gross deformities. Good, equal movement & strength bilaterally. ?Pulses:  Normal pulses noted. ?Extremities:  No clubbing or edema.  No cyanosis. ?Neurologic:  Alert and oriented x3;  grossly normal neurologically. ?Skin:  Intact without significant lesions or rashes. No jaundice. ?Psych:  Alert and cooperative. Normal mood and affect. ? ?Imaging Studies: ?No abdominal imaging ? ?Assessment and Plan:  ? ?DAION GINSBERG is a 61 y.o. pleasant African-American male with history of hypertension, diabetes, ESRD on hemodialysis, chronic  hep C genotype 1b, treatment na?ve with no known history of cirrhosis, history of acute on chronic anemia and melena which have currently resolved.   ? ? ?History of H. pylori infection ?Patient underwent EGD in 2/23, found to have H. pylori gastritis s/p triple therapy.  Recommend H. pylori breath test after 2 hours of empty stomach to confirm eradication  ? ?Chronic hepatitis C, genotype 1b ?No evidence of cirrhosis, started on Mavyret ?Immune to hepatitis B and A ?Right upper quadrant ultrasound revealed fatty liver and trace ascites only ?Recheck HCVRNA as well as LFTs in 6 months to confirm eradication ? ?Normocytic anemia ?Check iron panel, B12 and folate levels ? ?Follow up in 6 months ? ? ?Cephas Darby, MD ? ?

## 2022-02-05 LAB — IRON,TIBC AND FERRITIN PANEL
Ferritin: 679 ng/mL — ABNORMAL HIGH (ref 30–400)
Iron Saturation: 13 % — ABNORMAL LOW (ref 15–55)
Iron: 37 ug/dL — ABNORMAL LOW (ref 38–169)
Total Iron Binding Capacity: 296 ug/dL (ref 250–450)
UIBC: 259 ug/dL (ref 111–343)

## 2022-02-05 LAB — B12 AND FOLATE PANEL
Folate: >20 ng/mL (ref >3.0)
Vitamin B-12: 932 pg/mL (ref 232–1245)

## 2022-02-06 ENCOUNTER — Telehealth: Payer: Self-pay

## 2022-02-06 NOTE — Telephone Encounter (Signed)
Patient verbalized understanding he states he is not doing any oral iron supplements but they are giving him iron with his dialysis. He states he came back for his labs on 02/04/22 and they did not do the H pylori Breath test. He states do you want this now or can he do this at his next visit. He states he already came back to our office once for labs.  ?

## 2022-02-06 NOTE — Telephone Encounter (Signed)
Patient inform his nephrologist that he does not need iron during his dialysis.  H. pylori breath test is not urgent but I would like this to be done within next 2 to 3 weeks to make sure that his infection is gone.  If it is still there, he will need another round of antibiotics ? ?RV ?

## 2022-02-06 NOTE — Telephone Encounter (Signed)
-----   Message from Lin Landsman, MD sent at 02/05/2022  5:13 PM EDT ----- ?His iron levels are quite high.  If he is taking any oral iron supplements, please tell him not to take iron ?Please remind him about the H. pylori breath test ? ?Thanks ?RV ?

## 2022-02-07 NOTE — Telephone Encounter (Signed)
Informed patient and he states he will tell the Nephrologist and he states he will come when he can to get the lab test  ?

## 2022-08-14 ENCOUNTER — Ambulatory Visit: Payer: Medicaid Other | Admitting: Gastroenterology

## 2022-12-24 ENCOUNTER — Encounter: Payer: Self-pay | Admitting: Gastroenterology

## 2022-12-24 ENCOUNTER — Ambulatory Visit: Payer: 59 | Admitting: Gastroenterology

## 2023-03-09 IMAGING — US US ABDOMEN LIMITED
1 series · 13 of 25 positions shown · non-contrast
Comparison: Only comparison is renal ultrasound of 08/31/2018

CLINICAL DATA: Chronic hepatitis-C, follow-up.

EXAM:
ULTRASOUND ABDOMEN LIMITED RIGHT UPPER QUADRANT

[Series 1: us abdomen limited · 0.19mm/px · 13 of 44 slices shown]
[im 1/44]
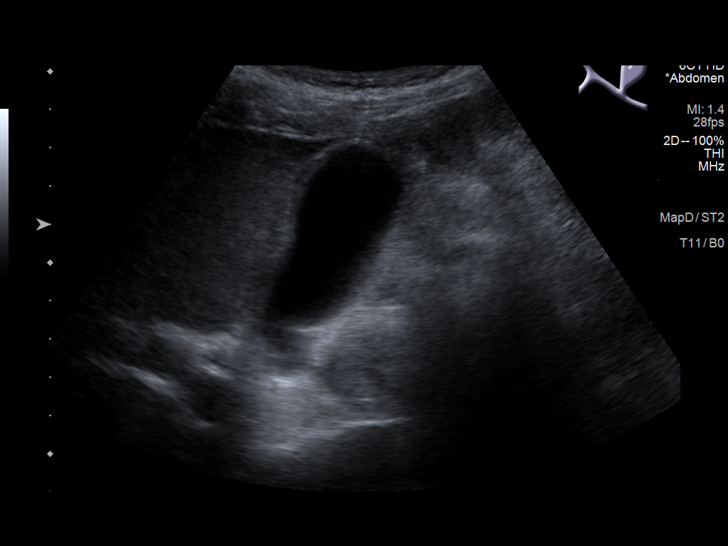
[im 4/44]
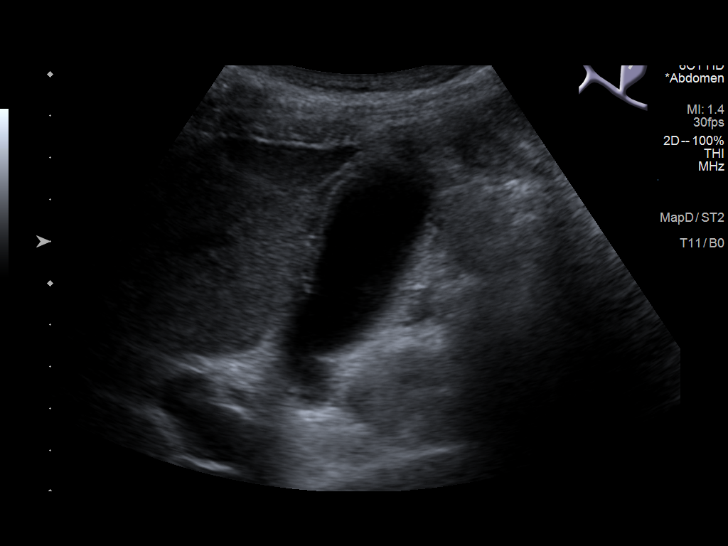
[im 8/44]
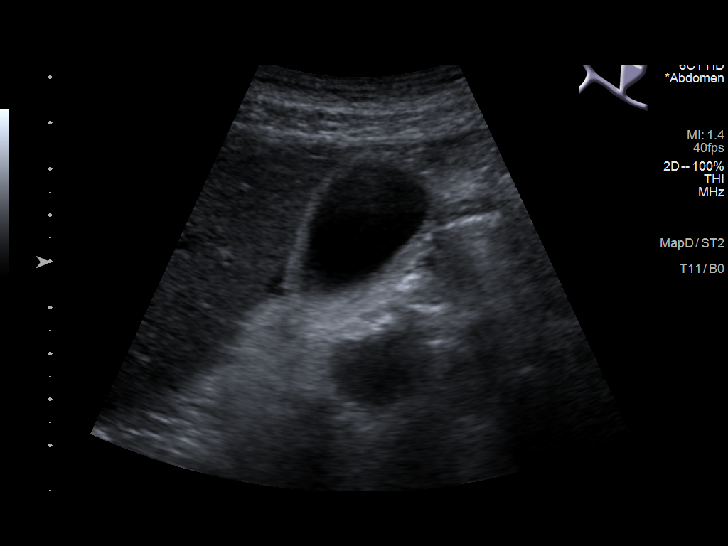
[im 11/44]
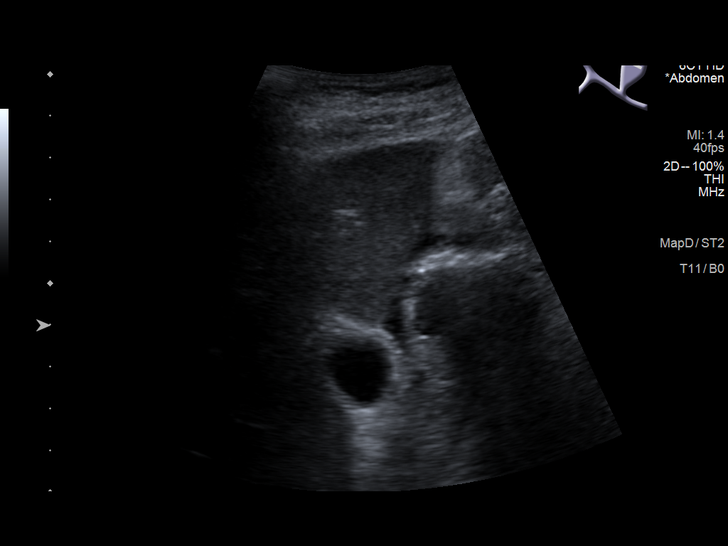
[im 15/44]
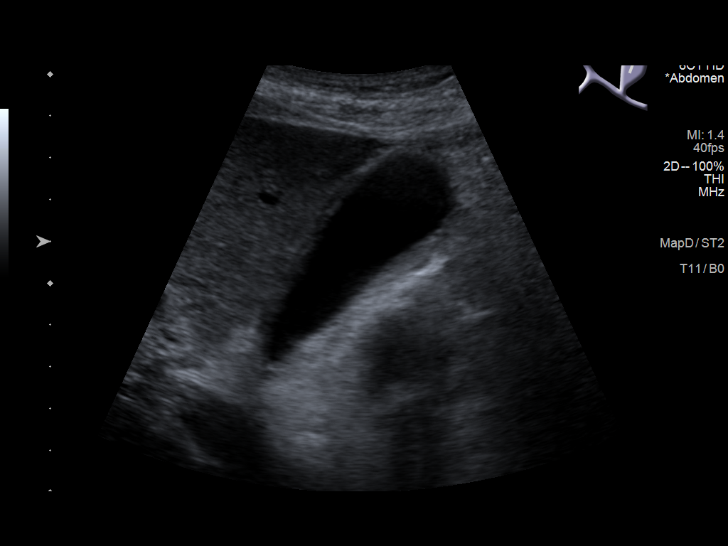
[im 18/44]
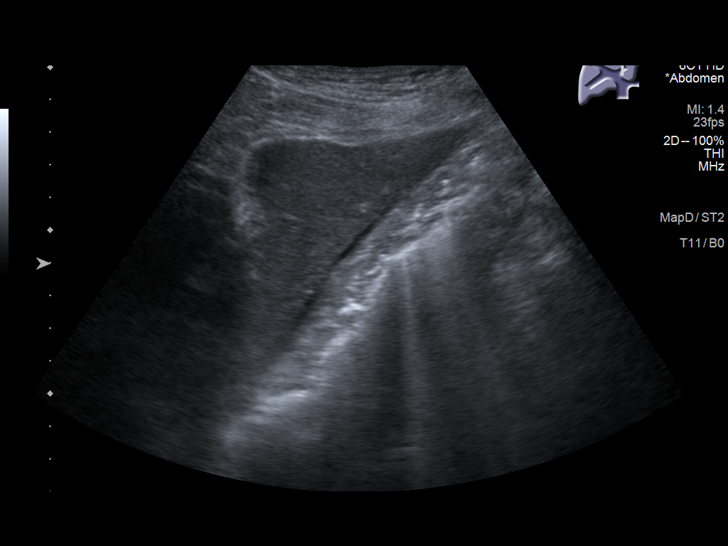
[im 22/44]
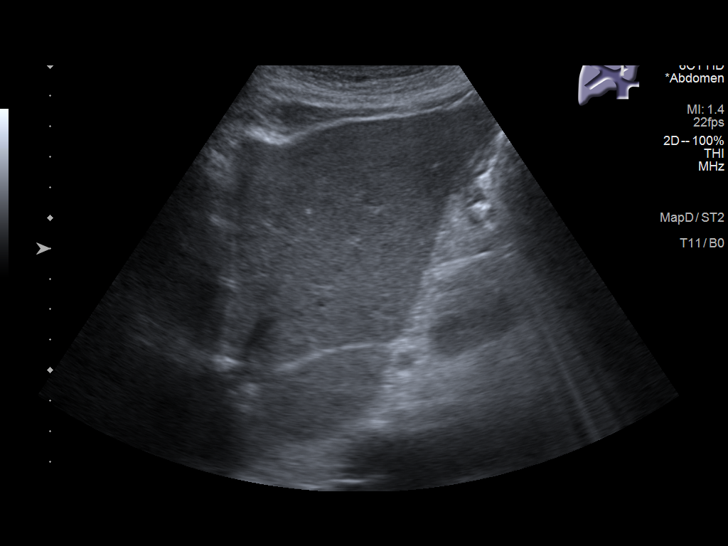
[im 26/44]
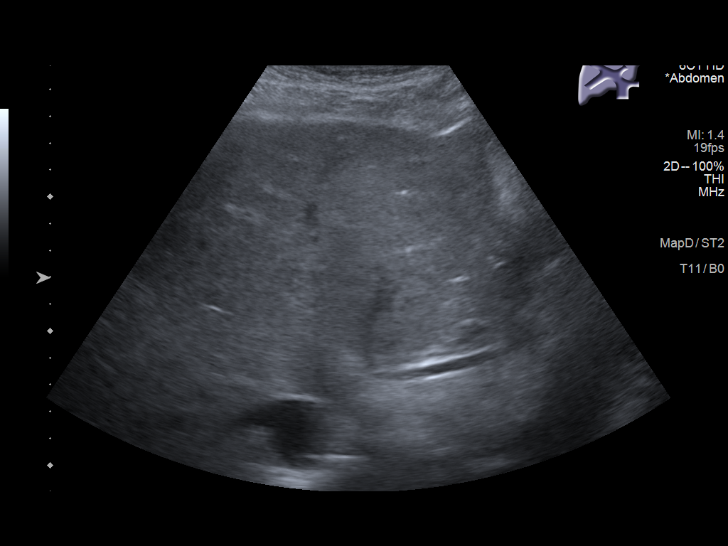
[im 29/44]
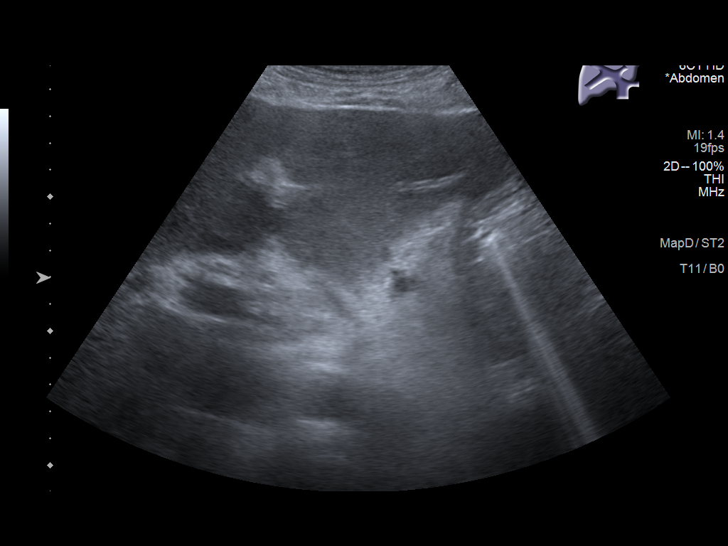
[im 33/44]
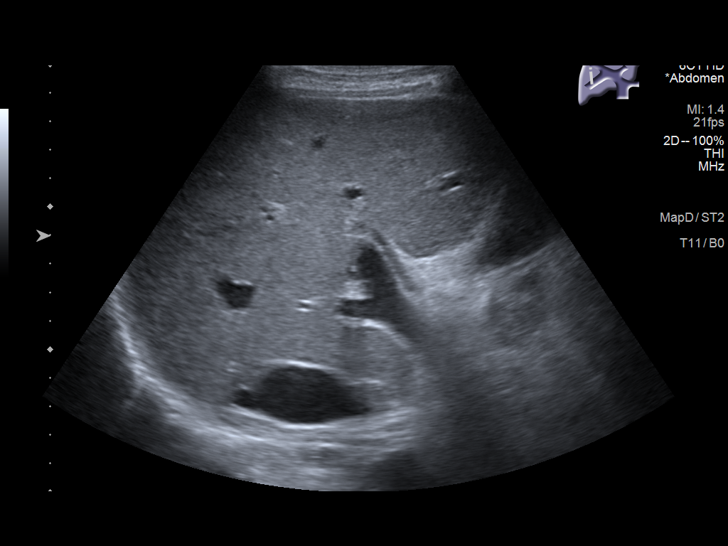
[im 36/44]
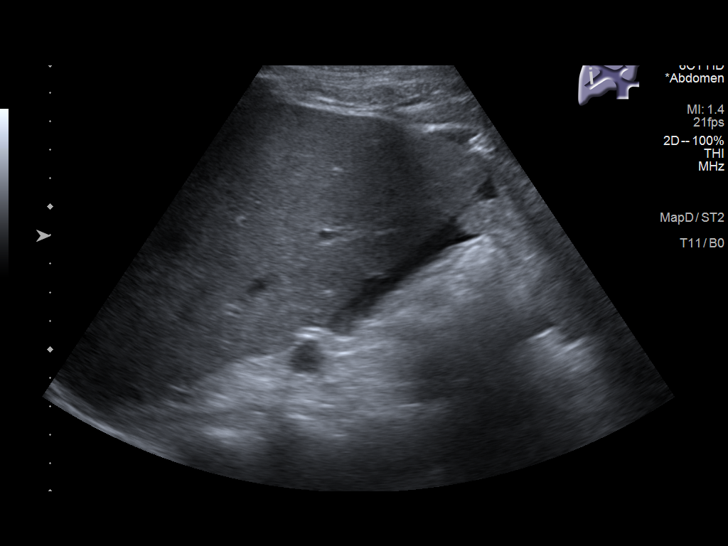
[im 40/44]
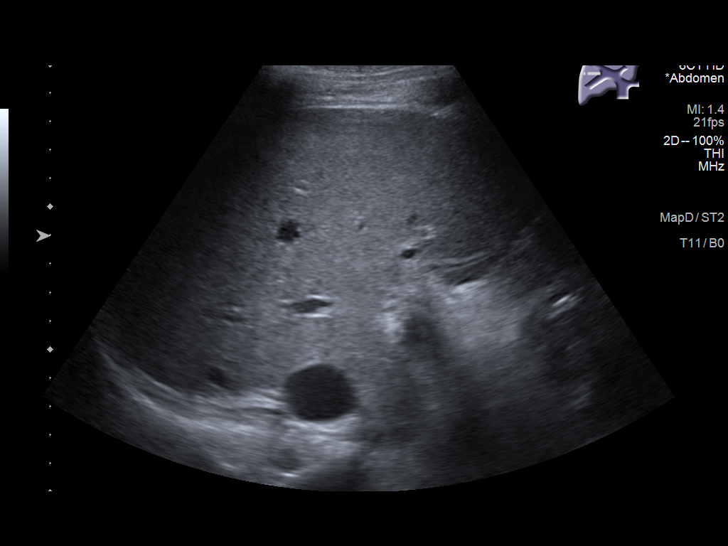
[im 44/44]
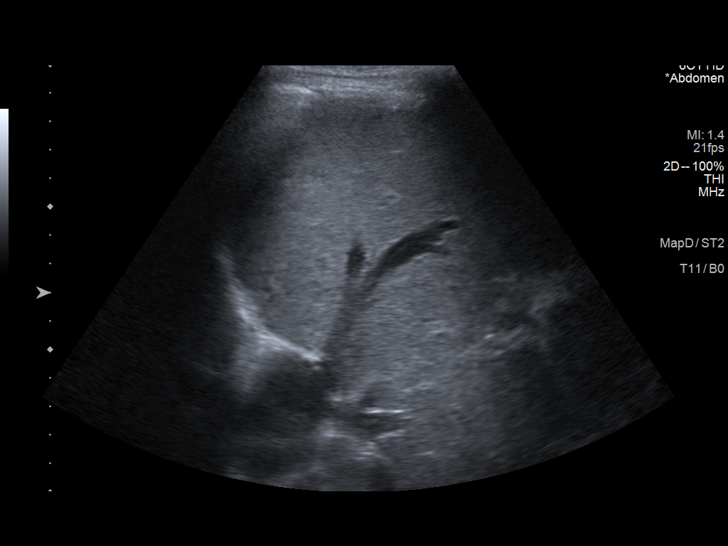

[13 of 25 positions shown; findings below may reference images not displayed]

FINDINGS: Gallbladder:

No gallstones or sonographic Murphy sign were observed on exam.
There is trace pericholecystic fluid. The gallbladder free wall is
thickened, measuring 5.3 mm.

Common bile duct:

Diameter: 2.1 mm.

Liver:

No focal lesion identified. There is generalized increased hepatic
parenchymal echogenicity consistent with fatty replacement. Portal
vein is patent on color Doppler imaging with normal direction of
blood flow towards the liver. The hepatic portal vein is slightly
prominent in caliber measuring 14 mm.

Other: There is trace perihepatic ascites.
IMPRESSION: 1. Increased hepatic echogenicity consistent with steatosis.
2. No focal lesion is seen through the steatosis.
3. Mildly thickened gallbladder free wall without stones or
sonographic Murphy's sign, showing trace pericholecystic fluid.
Findings could be due to chronic cholecystitis, pain medicated acute
cholecystitis, hepatic dysfunction or passive congestion, or
reactive wall thickening such as due to an adjacent inflammatory
process. Clinical correlation advised.
4. Normal directional flow in the hepatic portal vein, with slightly
prominent portal vein.
5. Trace perihepatic ascites.

## 2023-09-24 ENCOUNTER — Encounter: Payer: Self-pay | Admitting: Ophthalmology

## 2023-09-28 NOTE — Anesthesia Preprocedure Evaluation (Addendum)
Anesthesia Evaluation  Patient identified by MRN, date of birth, ID band Patient awake    Reviewed: Allergy & Precautions, H&P , NPO status , Patient's Chart, lab work & pertinent test results  Airway Mallampati: III  TM Distance: >3 FB Neck ROM: Full    Dental no notable dental hx. (+) Poor Dentition, Missing Very poor dentition, but patient states no loose teeth:   Pulmonary neg pulmonary ROS, Patient abstained from smoking.   Pulmonary exam normal breath sounds clear to auscultation       Cardiovascular hypertension, negative cardio ROS Normal cardiovascular exam Rhythm:Regular Rate:Normal     Neuro/Psych negative neurological ROS  negative psych ROS   GI/Hepatic negative GI ROS, Neg liver ROS,,,(+) Hepatitis -  Endo/Other  negative endocrine ROSdiabetes    Renal/GU Renal diseasenegative Renal ROS  negative genitourinary   Musculoskeletal negative musculoskeletal ROS (+)    Abdominal   Peds negative pediatric ROS (+)  Hematology negative hematology ROS (+) Blood dyscrasia, anemia   Anesthesia Other Findings Diabetes mellitus without complication Hypertension Renal disorder AKI (acute kidney injury) Hyperkalemia Metabolic acidosis, increased anion gap Dialysis patient, has port in right chest, last hemodialysis was Monday (today is Thursday). Plans dialysis tomorrow 10am after his postop cataract visit.   Absolutely delightful gentleman.     Need BMP day of surgery--done Na 136, K 4.6  Reproductive/Obstetrics negative OB ROS                              Anesthesia Physical Anesthesia Plan  ASA: 4  Anesthesia Plan: MAC   Post-op Pain Management:    Induction: Intravenous  PONV Risk Score and Plan:   Airway Management Planned: Natural Airway and Nasal Cannula  Additional Equipment:   Intra-op Plan:   Post-operative Plan:   Informed Consent: I have reviewed the patients  History and Physical, chart, labs and discussed the procedure including the risks, benefits and alternatives for the proposed anesthesia with the patient or authorized representative who has indicated his/her understanding and acceptance.     Dental Advisory Given  Plan Discussed with: Anesthesiologist, CRNA and Surgeon  Anesthesia Plan Comments: (Patient consented for risks of anesthesia including but not limited to:  - adverse reactions to medications - damage to eyes, teeth, lips or other oral mucosa - nerve damage due to positioning  - sore throat or hoarseness - Damage to heart, brain, nerves, lungs, other parts of body or loss of life  Patient voiced understanding and assent.)         Anesthesia Quick Evaluation

## 2023-09-30 NOTE — Discharge Instructions (Signed)

## 2023-10-08 ENCOUNTER — Ambulatory Visit
Admission: RE | Admit: 2023-10-08 | Discharge: 2023-10-08 | Disposition: A | Discharge status: Home / Self Care | Payer: 59 | Attending: Ophthalmology | Admitting: Ophthalmology

## 2023-10-08 ENCOUNTER — Ambulatory Visit: Payer: 59 | Admitting: Anesthesiology

## 2023-10-08 ENCOUNTER — Other Ambulatory Visit: Payer: Self-pay

## 2023-10-08 ENCOUNTER — Encounter
Admission: RE | Disposition: A | Discharge status: Home / Self Care | Payer: Self-pay | Source: Home / Self Care | Attending: Ophthalmology

## 2023-10-08 ENCOUNTER — Other Ambulatory Visit
Admission: RE | Admit: 2023-10-08 | Discharge: 2023-10-08 | Disposition: A | Discharge status: Home / Self Care | Payer: 59 | Source: Home / Self Care | Attending: Anesthesiology | Admitting: Anesthesiology

## 2023-10-08 ENCOUNTER — Encounter: Payer: Self-pay | Admitting: Ophthalmology

## 2023-10-08 DIAGNOSIS — H2511 Age-related nuclear cataract, right eye: Secondary | ICD-10-CM | POA: Diagnosis not present

## 2023-10-08 DIAGNOSIS — E1136 Type 2 diabetes mellitus with diabetic cataract: Secondary | ICD-10-CM | POA: Diagnosis present

## 2023-10-08 DIAGNOSIS — Z01812 Encounter for preprocedural laboratory examination: Secondary | ICD-10-CM

## 2023-10-08 DIAGNOSIS — I10 Essential (primary) hypertension: Secondary | ICD-10-CM | POA: Insufficient documentation

## 2023-10-08 DIAGNOSIS — N186 End stage renal disease: Secondary | ICD-10-CM

## 2023-10-08 HISTORY — DX: Dependence on renal dialysis: Z99.2

## 2023-10-08 HISTORY — PX: CATARACT EXTRACTION W/PHACO: SHX586

## 2023-10-08 LAB — BASIC METABOLIC PANEL
Anion gap: 18 — ABNORMAL HIGH (ref 5–15)
BUN: 66 mg/dL — ABNORMAL HIGH (ref 8–23)
CO2: 17 mmol/L — ABNORMAL LOW (ref 22–32)
Calcium: 9 mg/dL (ref 8.9–10.3)
Chloride: 101 mmol/L (ref 98–111)
Creatinine, Ser: 10.58 mg/dL — ABNORMAL HIGH (ref 0.61–1.24)
GFR, Estimated: 5 mL/min — ABNORMAL LOW (ref >60)
Glucose, Bld: 89 mg/dL (ref 70–99)
Potassium: 4.6 mmol/L (ref 3.5–5.1)
Sodium: 136 mmol/L (ref 135–145)

## 2023-10-08 SURGERY — PHACOEMULSIFICATION, CATARACT, WITH IOL INSERTION
Anesthesia: Monitor Anesthesia Care | Laterality: Right

## 2023-10-08 MED ORDER — TETRACAINE HCL 0.5 % OP SOLN
OPHTHALMIC | Status: AC
Start: 2023-10-08 — End: ?
  Filled 2023-10-08: qty 4

## 2023-10-08 MED ORDER — FENTANYL CITRATE (PF) 100 MCG/2ML IJ SOLN
INTRAMUSCULAR | Status: DC | PRN
  Administered 2023-10-08: 25 ug via INTRAVENOUS
  Administered 2023-10-08: 50 ug via INTRAVENOUS

## 2023-10-08 MED ORDER — MIDAZOLAM HCL 2 MG/2ML IJ SOLN
INTRAMUSCULAR | Status: AC
Start: 2023-10-08 — End: ?
  Filled 2023-10-08: qty 2

## 2023-10-08 MED ORDER — TETRACAINE HCL 0.5 % OP SOLN
1.0000 [drp] | OPHTHALMIC | Status: DC | PRN
  Administered 2023-10-08 (×3): 1 [drp] via OPHTHALMIC

## 2023-10-08 MED ORDER — SIGHTPATH DOSE#1 NA HYALUR & NA CHOND-NA HYALUR IO KIT
PACK | INTRAOCULAR | Status: DC | PRN
  Administered 2023-10-08: 1 via OPHTHALMIC

## 2023-10-08 MED ORDER — LIDOCAINE HCL (PF) 2 % IJ SOLN
INTRAOCULAR | Status: DC | PRN
  Administered 2023-10-08: 1 mL via INTRAOCULAR

## 2023-10-08 MED ORDER — TETRACAINE 0.5 % OP SOLN OPTIME - NO CHARGE
OPHTHALMIC | Status: DC | PRN
  Administered 2023-10-08: 2 [drp] via OPHTHALMIC

## 2023-10-08 MED ORDER — SIGHTPATH DOSE#1 BSS IO SOLN
INTRAOCULAR | Status: DC | PRN
  Administered 2023-10-08: 15 mL via INTRAOCULAR

## 2023-10-08 MED ORDER — ARMC OPHTHALMIC DILATING DROPS
1.0000 | OPHTHALMIC | Status: DC | PRN
  Administered 2023-10-08 (×3): 1 via OPHTHALMIC

## 2023-10-08 MED ORDER — MIDAZOLAM HCL 2 MG/2ML IJ SOLN
INTRAMUSCULAR | Status: DC | PRN
  Administered 2023-10-08: 2 mg via INTRAVENOUS

## 2023-10-08 MED ORDER — ERYTHROMYCIN 5 MG/GM OP OINT
TOPICAL_OINTMENT | OPHTHALMIC | Status: DC | PRN
  Administered 2023-10-08: 1 via OPHTHALMIC

## 2023-10-08 MED ORDER — FENTANYL CITRATE (PF) 100 MCG/2ML IJ SOLN
INTRAMUSCULAR | Status: AC
  Filled 2023-10-08: qty 2

## 2023-10-08 MED ORDER — MOXIFLOXACIN HCL 0.5 % OP SOLN
OPHTHALMIC | Status: DC | PRN
  Administered 2023-10-08: .2 mL via OPHTHALMIC

## 2023-10-08 MED ORDER — ARMC OPHTHALMIC DILATING DROPS
OPHTHALMIC | Status: AC
Start: 2023-10-08 — End: ?
  Filled 2023-10-08: qty 0.5

## 2023-10-08 MED ORDER — TRYPAN BLUE 0.06 % IO SOSY
PREFILLED_SYRINGE | INTRAOCULAR | Status: DC | PRN
  Administered 2023-10-08: .5 mL via INTRAOCULAR

## 2023-10-08 MED ORDER — SIGHTPATH DOSE#1 BSS IO SOLN
INTRAOCULAR | Status: DC | PRN
  Administered 2023-10-08: 82 mL via OPHTHALMIC

## 2023-10-08 MED ORDER — DEXMEDETOMIDINE HCL IN NACL 80 MCG/20ML IV SOLN
INTRAVENOUS | Status: DC | PRN
  Administered 2023-10-08 (×3): 4 ug via INTRAVENOUS

## 2023-10-08 MED ORDER — BRIMONIDINE TARTRATE-TIMOLOL 0.2-0.5 % OP SOLN
OPHTHALMIC | Status: DC | PRN
  Administered 2023-10-08: 1 [drp] via OPHTHALMIC

## 2023-10-08 SURGICAL SUPPLY — 14 items
BNDG EYE OVAL 2 1/8 X 2 5/8 (GAUZE/BANDAGES/DRESSINGS) IMPLANT
CANNULA ANT/CHMB 27G (MISCELLANEOUS) IMPLANT
CANNULA ANT/CHMB 27GA (MISCELLANEOUS) ×1 IMPLANT
CATARACT SUITE SIGHTPATH (MISCELLANEOUS) ×1 IMPLANT
DISSECTOR HYDRO NUCLEUS 50X22 (MISCELLANEOUS) ×1 IMPLANT
DRSG TEGADERM 2-3/8X2-3/4 SM (GAUZE/BANDAGES/DRESSINGS) ×1 IMPLANT
FEE CATARACT SUITE SIGHTPATH (MISCELLANEOUS) ×1 IMPLANT
GLOVE SURG SYN 7.5 E (GLOVE) ×1 IMPLANT
GLOVE SURG SYN 7.5 PF PI (GLOVE) ×1 IMPLANT
GLOVE SURG SYN 8.5 E (GLOVE) ×1 IMPLANT
GLOVE SURG SYN 8.5 PF PI (GLOVE) ×1 IMPLANT
LENS CLAREON WAGON WHEEL 21.0 (Intraocular Lens) ×1 IMPLANT
LENS IOL CLRN WGN WHL 21.0 (Intraocular Lens) IMPLANT
SUT SILK 4 0 G 3 (SUTURE) IMPLANT

## 2023-10-08 NOTE — Op Note (Signed)
OPERATIVE NOTE  Casey Franco [id number] 10/08/2023   PREOPERATIVE DIAGNOSIS: Nuclear sclerotic cataract right eye. H25.11   POSTOPERATIVE DIAGNOSIS: Nuclear sclerotic cataract right eye. H25.11   PROCEDURE:  Phacoemusification with posterior chamber intraocular lens placement of the right eye  Ultrasound time: Procedure(s): CATARACT EXTRACTION PHACO AND INTRAOCULAR LENS PLACEMENT (IOC) RIGHT DIABETIC 12.19 01:08.8 (Right)  LENS:   Implant Name Type Inv. Item Serial No. Manufacturer Lot No. LRB No. Used Action  LENS [employer] WAGON WHEEL 21.0 - [id number] Intraocular Lens LENS CL[employer] WAGON WHEEL 21.0 [id number] [employer]  Right 1 Implanted      SURGEON:  Julious Payer. Rolley Sims, MD   ANESTHESIA:  Topical with tetracaine drops, augmented with 1% preservative-free intracameral lidocaine.   COMPLICATIONS:  None.   DESCRIPTION OF PROCEDURE:  The patient was identified in the holding room and transported to the operating room and placed in the supine position under the operating microscope.  The right eye was identified as the operative eye, which was prepped and draped in the usual sterile ophthalmic fashion.   The patient had a severe Bell's reflex that could not be addressed with conservative measures, so a 4-0 needle on silk suture was passed through the inferior cornea and tied to a hemostat forceps, which was anchored to the drape in order to keep the patient's gaze orthophoric for the surgery. A 1 millimeter clear-corneal paracentesis was made superotemporally. Preservative-free 1% lidocaine mixed with 1:1,000 bisulfite-free aqueous solution of epinephrine was injected into the anterior chamber. The red reflex was poor secondary to a dense posterior subcapsular cataract so Trypan blue was injected intracamerally to stain the anterior capsule and facilitate safe creation of the capsulorrhexis. The anterior chamber was then filled with Viscoat viscoelastic. A 2.4 millimeter keratome was used  to make a clear-corneal incision inferotemporally. A curvilinear capsulorrhexis was made with a cystotome and capsulorrhexis forceps. Balanced salt solution was used to hydrodissect and hydrodelineate the nucleus. Phacoemulsification was then used to remove the lens nucleus and epinucleus. The remaining cortex was then removed using the irrigation and aspiration handpiece. Provisc was then placed into the capsular bag to distend it for lens placement. A +21.00 D [id number] intraocular lens was then injected into the capsular bag. The remaining viscoelastic was aspirated.   Wounds were hydrated with balanced salt solution.  The anterior chamber was inflated to a physiologic pressure with balanced salt solution.  No wound leaks were noted. Vigamox was injected intracamerally.  Timolol and Brimonidine drops were applied to the eye. The 4-0 silk suture was removed, erythromycin ointment was applied to the eye and a patch was taped over the eye. The patient was taken to the recovery room in stable condition without complications of anesthesia or surgery.   Casey Franco 10/08/2023, 12:58 PM

## 2023-10-08 NOTE — Transfer of Care (Signed)
Immediate Anesthesia Transfer of Care Note  Patient: Casey Franco  Procedure(s) Performed: CATARACT EXTRACTION PHACO AND INTRAOCULAR LENS PLACEMENT (IOC) RIGHT DIABETIC 12.19 01:08.8 (Right)  Patient Location: PACU  Anesthesia Type: MAC  Level of Consciousness: awake, alert  and patient cooperative  Airway and Oxygen Therapy: Patient Spontanous Breathing and Patient connected to supplemental oxygen  Post-op Assessment: Post-op Vital signs reviewed, Patient's Cardiovascular Status Stable, Respiratory Function Stable, Patent Airway and No signs of Nausea or vomiting  Post-op Vital Signs: Reviewed and stable  Complications: No notable events documented.

## 2023-10-08 NOTE — Anesthesia Postprocedure Evaluation (Signed)
Anesthesia Post Note  Patient: Casey Franco  Procedure(s) Performed: CATARACT EXTRACTION PHACO AND INTRAOCULAR LENS PLACEMENT (IOC) RIGHT DIABETIC 12.19 01:08.8 (Right)  Patient location during evaluation: PACU Anesthesia Type: MAC Level of consciousness: awake and alert Pain management: pain level controlled Vital Signs Assessment: post-procedure vital signs reviewed and stable Respiratory status: spontaneous breathing, nonlabored ventilation, respiratory function stable and patient connected to nasal cannula oxygen Cardiovascular status: stable and blood pressure returned to baseline Postop Assessment: no apparent nausea or vomiting Anesthetic complications: no   No notable events documented.   Last Vitals:  Vitals:   10/08/23 1300 10/08/23 1306  BP: (!) 163/101 (!) 178/99  Pulse: 83 86  Resp: 14 16  Temp: 36.7 C 36.7 C  SpO2: 94% 96%    Last Pain:  Vitals:   10/08/23 1306  TempSrc:   PainSc: 0-No pain                 Sheli Dorin C Syaire Saber

## 2023-10-08 NOTE — H&P (Signed)
[facility]   Primary Care Physician:  Hilda Blades, MD Ophthalmologist: Dr. Deberah Pelton  Pre-Procedure History & Physical: HPI:  Casey Franco is a 62 y.o. male here for cataract surgery.   Past Medical History:  Diagnosis Date   AKI (acute kidney injury) ([facility]) 08/30/2018   Diabetes mellitus without complication ([facility])    no meds since weight loss   Dialysis patient ([facility])    Mon, Wed, Fri   Hyperkalemia 02/04/2021   Hypertension    Metabolic acidosis, increased anion gap 02/04/2021   Renal disorder     Past Surgical History:  Procedure Laterality Date   COLONOSCOPY WITH PROPOFOL N/A 11/21/2021   Procedure: COLONOSCOPY WITH PROPOFOL;  Surgeon: Toney Reil, MD;  Location: [facility] ENDOSCOPY;  Service: Gastroenterology;  Laterality: N/A;   ESOPHAGOGASTRODUODENOSCOPY (EGD) WITH PROPOFOL N/A 11/21/2021   Procedure: ESOPHAGOGASTRODUODENOSCOPY (EGD) WITH PROPOFOL;  Surgeon: Toney Reil, MD;  Location: [facility] ENDOSCOPY;  Service: Gastroenterology;  Laterality: N/A;   INSERTION OF DIALYSIS CATHETER     LEG SURGERY  1987    Prior to Admission medications   Medication Sig Start Date End Date Taking? Authorizing Provider  amLODipine (NORVASC) 10 MG tablet Take 1 tablet by mouth daily. 05/18/20  Yes [provider]  Ascorbic Acid (VITAMIN C PO) Take by mouth.   Yes [provider]  calcium acetate (PHOSLO) 667 MG capsule Take 1,334 mg by mouth 3 (three) times a week. 02/04/21  Yes [provider]  furosemide (LASIX) 80 MG tablet Take 80 mg by mouth daily. 01/25/21  Yes [provider]  hydrALAZINE (APRESOLINE) 25 MG tablet Take 25 mg by mouth 3 (three) times daily. 01/25/21  Yes [provider]  irbesartan (AVAPRO) 300 MG tablet Take 300 mg by mouth daily. 02/04/21  Yes [provider]  Multiple Vitamin (MULTIVITAMIN) tablet Take 1 tablet by mouth daily.   Yes [provider]  tamsulosin (FLOMAX) 0.4 MG CAPS  capsule Take 0.4 mg by mouth daily. 01/30/21  Yes [provider]  VITAMIN E PO Take by mouth.   Yes [provider]  blood glucose meter kit and supplies KIT Dispense based on patient and insurance preference. Use up to four times daily as directed. (FOR ICD-9 250.00, 250.01). 09/01/18   Auburn Bilberry, MD    Allergies as of 08/26/2023   (No Known Allergies)    Family History  Problem Relation Age of Onset   Diabetes Mellitus II Sister    Diabetes Mellitus II Paternal Grandmother     Social History   Socioeconomic History   Marital status: Single    Spouse name: Not on file   Number of children: Not on file   Years of education: Not on file   Highest education level: Not on file  Occupational History   Not on file  Tobacco Use   Smoking status: Never   Smokeless tobacco: Never  Vaping Use   Vaping status: Never Used  Substance and Sexual Activity   Alcohol use: Yes    Alcohol/week: 7.0 standard drinks of alcohol    Types: 7 Cans of beer per week   Drug use: Yes    Types: Cocaine, Marijuana    Comment: 09/24/23 - Marijuana 3x/week. No cocaine since Friday 08/27/2018   Sexual activity: Not on file  Other Topics Concern   Not on file  Social History Narrative   Not on file   Social Drivers of Health   Financial Resource Strain:  Low Risk  (05/15/2020)   Received from [facility], [facility]   Overall Financial Resource Strain (CARDIA)    Difficulty of Paying Living Expenses: Not very hard  Food Insecurity: No Food Insecurity (05/15/2020)   Received from [facility], [facility]   Hunger Vital Sign    Worried About Running Out of Food in the Last Year: Never true    Ran Out of Food in the Last Year: Never true  Transportation Needs: No Transportation Needs (05/15/2020)   Received from [facility], [facility]   [facility] - Transportation    Lack of Transportation (Medical): No    Lack of Transportation (Non-Medical): No   Physical Activity: Not on file  Stress: Not on file  Social Connections: Not on file  Intimate Partner Violence: Not on file    Review of Systems: See HPI, otherwise negative ROS  Physical Exam: Ht 6' (1.829 m)   Wt 72.6 kg   BMI 21.70 kg/m  General:   Alert, cooperative in NAD Head:  Normocephalic and atraumatic. Respiratory:  Normal work of breathing. Cardiovascular:  RRR  Impression/Plan: Casey Franco is here for cataract surgery.  Risks, benefits, limitations, and alternatives regarding cataract surgery have been reviewed with the patient.  Questions have been answered.  All parties agreeable.   Casey Pandy, MD  10/08/2023, 9:04 AM

## 2023-10-09 ENCOUNTER — Encounter: Payer: Self-pay | Admitting: Ophthalmology

## 2023-10-09 NOTE — Anesthesia Preprocedure Evaluation (Addendum)
Anesthesia Evaluation    Airway Mallampati: III  TM Distance: >3 FB     Dental  (+) Poor Dentition Very poor dentition, but patient states no loose teeth::   Pulmonary Patient abstained from smoking.          Cardiovascular hypertension,      Neuro/Psych    GI/Hepatic   Endo/Other  diabetes    Renal/GU      Musculoskeletal   Abdominal   Peds  Hematology   Anesthesia Other Findings Previous cataract 10-08-23 Dr. Juel Burrow anesthesiologist and [place] CRNA, previously had versed 2 mg IV, fentanyl 75 mcg IV and precedex 12 mcg IV  This is same patient who needed eyelid sutured open by Dr. Rolley Sims   Diabetes mellitus without complication Hypertension Renal disorderAKI (acute kidney injury) HyperkalemiaMetabolic acidosis, increased anion gap Dialysis patient, has port in right chest,  Needs BMP preop   Reproductive/Obstetrics                              Anesthesia Physical Anesthesia Plan  ASA: 4  Anesthesia Plan: MAC   Post-op Pain Management:    Induction: Intravenous  PONV Risk Score and Plan:   Airway Management Planned: Natural Airway and Nasal Cannula  Additional Equipment:   Intra-op Plan:   Post-operative Plan:   Informed Consent: I have reviewed the patients History and Physical, chart, labs and discussed the procedure including the risks, benefits and alternatives for the proposed anesthesia with the patient or authorized representative who has indicated his/her understanding and acceptance.     Dental Advisory Given  Plan Discussed with: Anesthesiologist, CRNA and Surgeon  Anesthesia Plan Comments: (Patient consented for risks of anesthesia including but not limited to:  - adverse reactions to medications - damage to eyes, teeth, lips or other oral mucosa - nerve damage due to positioning  - sore throat or hoarseness - Damage to heart, brain, nerves, lungs,  other parts of body or loss of life  Patient voiced understanding and assent.)       Anesthesia Quick Evaluation

## 2023-10-20 NOTE — Discharge Instructions (Signed)

## 2023-10-22 ENCOUNTER — Ambulatory Visit: Payer: 59 | Admitting: Anesthesiology

## 2023-10-22 ENCOUNTER — Other Ambulatory Visit: Payer: Self-pay

## 2023-10-22 ENCOUNTER — Encounter: Payer: Self-pay | Admitting: Ophthalmology

## 2023-10-22 ENCOUNTER — Other Ambulatory Visit
Admission: RE | Admit: 2023-10-22 | Discharge: 2023-10-22 | Disposition: A | Discharge status: Home / Self Care | Payer: 59 | Attending: Anesthesiology | Admitting: Anesthesiology

## 2023-10-22 ENCOUNTER — Encounter
Admission: RE | Disposition: A | Discharge status: Home / Self Care | Payer: Self-pay | Source: Home / Self Care | Attending: Ophthalmology

## 2023-10-22 ENCOUNTER — Ambulatory Visit
Admission: RE | Admit: 2023-10-22 | Discharge: 2023-10-22 | Disposition: A | Discharge status: Home / Self Care | Payer: 59 | Attending: Ophthalmology | Admitting: Ophthalmology

## 2023-10-22 DIAGNOSIS — N289 Disorder of kidney and ureter, unspecified: Secondary | ICD-10-CM | POA: Insufficient documentation

## 2023-10-22 DIAGNOSIS — H2512 Age-related nuclear cataract, left eye: Secondary | ICD-10-CM | POA: Diagnosis not present

## 2023-10-22 DIAGNOSIS — Z01818 Encounter for other preprocedural examination: Secondary | ICD-10-CM

## 2023-10-22 DIAGNOSIS — Z992 Dependence on renal dialysis: Secondary | ICD-10-CM | POA: Insufficient documentation

## 2023-10-22 DIAGNOSIS — I10 Essential (primary) hypertension: Secondary | ICD-10-CM | POA: Insufficient documentation

## 2023-10-22 DIAGNOSIS — E1136 Type 2 diabetes mellitus with diabetic cataract: Secondary | ICD-10-CM | POA: Insufficient documentation

## 2023-10-22 HISTORY — PX: CATARACT EXTRACTION W/PHACO: SHX586

## 2023-10-22 LAB — BASIC METABOLIC PANEL
Anion gap: 14 (ref 5–15)
BUN: 34 mg/dL — ABNORMAL HIGH (ref 8–23)
CO2: 22 mmol/L (ref 22–32)
Calcium: 8.8 mg/dL — ABNORMAL LOW (ref 8.9–10.3)
Chloride: 101 mmol/L (ref 98–111)
Creatinine, Ser: 6.79 mg/dL — ABNORMAL HIGH (ref 0.61–1.24)
GFR, Estimated: 8 mL/min — ABNORMAL LOW (ref >60)
Glucose, Bld: 92 mg/dL (ref 70–99)
Potassium: 3.7 mmol/L (ref 3.5–5.1)
Sodium: 137 mmol/L (ref 135–145)

## 2023-10-22 SURGERY — PHACOEMULSIFICATION, CATARACT, WITH IOL INSERTION
Anesthesia: Monitor Anesthesia Care | Site: Eye | Laterality: Left

## 2023-10-22 MED ORDER — SIGHTPATH DOSE#1 BSS IO SOLN
INTRAOCULAR | Status: DC | PRN
  Administered 2023-10-22: 15 mL

## 2023-10-22 MED ORDER — MIDAZOLAM HCL 2 MG/2ML IJ SOLN
INTRAMUSCULAR | Status: AC
  Filled 2023-10-22: qty 2

## 2023-10-22 MED ORDER — MOXIFLOXACIN HCL 0.5 % OP SOLN
OPHTHALMIC | Status: DC | PRN
  Administered 2023-10-22: .2 mL via OPHTHALMIC

## 2023-10-22 MED ORDER — FENTANYL CITRATE (PF) 100 MCG/2ML IJ SOLN
INTRAMUSCULAR | Status: AC
  Filled 2023-10-22: qty 2

## 2023-10-22 MED ORDER — TETRACAINE HCL 0.5 % OP SOLN
OPHTHALMIC | Status: AC
  Filled 2023-10-22: qty 4

## 2023-10-22 MED ORDER — LIDOCAINE HCL (PF) 2 % IJ SOLN
INTRAOCULAR | Status: DC | PRN
  Administered 2023-10-22: 1 mL via INTRAOCULAR

## 2023-10-22 MED ORDER — ARMC OPHTHALMIC DILATING DROPS
OPHTHALMIC | Status: AC
  Filled 2023-10-22: qty 0.5

## 2023-10-22 MED ORDER — TETRACAINE 0.5 % OP SOLN OPTIME - NO CHARGE
OPHTHALMIC | Status: DC | PRN
  Administered 2023-10-22: 2 [drp] via OPHTHALMIC

## 2023-10-22 MED ORDER — FENTANYL CITRATE (PF) 100 MCG/2ML IJ SOLN
INTRAMUSCULAR | Status: DC | PRN
  Administered 2023-10-22: 50 ug via INTRAVENOUS

## 2023-10-22 MED ORDER — TETRACAINE HCL 0.5 % OP SOLN
1.0000 [drp] | OPHTHALMIC | Status: DC | PRN
  Administered 2023-10-22 (×3): 1 [drp] via OPHTHALMIC

## 2023-10-22 MED ORDER — SIGHTPATH DOSE#1 NA HYALUR & NA CHOND-NA HYALUR IO KIT
PACK | INTRAOCULAR | Status: DC | PRN
  Administered 2023-10-22: 1 via OPHTHALMIC

## 2023-10-22 MED ORDER — ARMC OPHTHALMIC DILATING DROPS
1.0000 | OPHTHALMIC | Status: DC | PRN
  Administered 2023-10-22 (×3): 1 via OPHTHALMIC

## 2023-10-22 MED ORDER — BRIMONIDINE TARTRATE-TIMOLOL 0.2-0.5 % OP SOLN
OPHTHALMIC | Status: DC | PRN
  Administered 2023-10-22: 1 [drp] via OPHTHALMIC

## 2023-10-22 MED ORDER — MIDAZOLAM HCL 2 MG/2ML IJ SOLN
INTRAMUSCULAR | Status: DC | PRN
  Administered 2023-10-22: 1 mg via INTRAVENOUS

## 2023-10-22 MED ORDER — SIGHTPATH DOSE#1 BSS IO SOLN
INTRAOCULAR | Status: DC | PRN
  Administered 2023-10-22: 74 mL via OPHTHALMIC

## 2023-10-22 SURGICAL SUPPLY — 21 items
BNDG EYE OVAL 2 1/8 X 2 5/8 (GAUZE/BANDAGES/DRESSINGS) IMPLANT
CANNULA ANT/CHMB 27G (MISCELLANEOUS) IMPLANT
CANNULA ANT/CHMB 27GA (MISCELLANEOUS)
CATARACT SUITE SIGHTPATH (MISCELLANEOUS) ×1
DISSECTOR HYDRO NUCLEUS 50X22 (MISCELLANEOUS) ×1 IMPLANT
DRSG TEGADERM 2-3/8X2-3/4 SM (GAUZE/BANDAGES/DRESSINGS) ×1 IMPLANT
FEE CATARACT SUITE SIGHTPATH (MISCELLANEOUS) ×1 IMPLANT
GLOVE SURG SYN 7.5 E (GLOVE) ×1
GLOVE SURG SYN 7.5 PF PI (GLOVE) ×1 IMPLANT
GLOVE SURG SYN 8.5 E (GLOVE) ×1
GLOVE SURG SYN 8.5 PF PI (GLOVE) ×1 IMPLANT
LENS CLAREON WAGON WHEEL 22.5 (Intraocular Lens) ×1 IMPLANT
LENS IOL CLRN WGN WHL 22.5 (Intraocular Lens) IMPLANT
NDL FILTER BLUNT 18X1 1/2 (NEEDLE) IMPLANT
NEEDLE FILTER BLUNT 18X1 1/2 (NEEDLE)
PACK VIT ANT 23G (MISCELLANEOUS) IMPLANT
RING MALYGIN 7.0 (MISCELLANEOUS) IMPLANT
SUT ETHILON 10-0 CS-B-6CS-B-6 (SUTURE)
SUTURE EHLN 10-0 CS-B-6CS-B-6 (SUTURE) IMPLANT
SYR 3ML LL SCALE MARK (SYRINGE) IMPLANT
SYR 5ML LL (SYRINGE) IMPLANT

## 2023-10-22 NOTE — Transfer of Care (Signed)
 Immediate Anesthesia Transfer of Care Note  Patient: Casey Franco  Procedure(s) Performed: CATARACT EXTRACTION PHACO AND INTRAOCULAR LENS PLACEMENT (IOC) LEFT DIABETIC MALYUGIN OMIDRIA (Left: Eye)  Patient Location: PACU  Anesthesia Type: MAC  Level of Consciousness: awake, alert  and patient cooperative  Airway and Oxygen Therapy: Patient Spontanous Breathing and Patient connected to supplemental oxygen  Post-op Assessment: Post-op Vital signs reviewed, Patient's Cardiovascular Status Stable, Respiratory Function Stable, Patent Airway and No signs of Nausea or vomiting  Post-op Vital Signs: Reviewed and stable  Complications: No notable events documented.

## 2023-10-22 NOTE — H&P (Signed)
 [facility]   Primary Care Physician:  Dennise Josepha POUR, MD Ophthalmologist: Dr. Feliciano Ober  Pre-Procedure History & Physical: HPI:  Casey Franco is a 63 y.o. male here for cataract surgery.   Past Medical History:  Diagnosis Date   AKI (acute kidney injury) (HCC) 08/30/2018   Diabetes mellitus without complication (HCC)    no meds since weight loss   Dialysis patient (HCC)    Mon, Wed, Fri   Hyperkalemia 02/04/2021   Hypertension    Metabolic acidosis, increased anion gap 02/04/2021   Renal disorder     Past Surgical History:  Procedure Laterality Date   CATARACT EXTRACTION W/PHACO Right 10/08/2023   Procedure: CATARACT EXTRACTION PHACO AND INTRAOCULAR LENS PLACEMENT (IOC) RIGHT DIABETIC 12.19 01:08.8;  Surgeon: Ober Feliciano Hugger, MD;  Location: [facility];  Service: Ophthalmology;  Laterality: Right;   COLONOSCOPY WITH PROPOFOL  N/A 11/21/2021   Procedure: COLONOSCOPY WITH PROPOFOL ;  Surgeon: Unk Corinn Skiff, MD;  Location: [facility] ENDOSCOPY;  Service: Gastroenterology;  Laterality: N/A;   ESOPHAGOGASTRODUODENOSCOPY (EGD) WITH PROPOFOL  N/A 11/21/2021   Procedure: ESOPHAGOGASTRODUODENOSCOPY (EGD) WITH PROPOFOL ;  Surgeon: Unk Corinn Skiff, MD;  Location: [facility] ENDOSCOPY;  Service: Gastroenterology;  Laterality: N/A;   INSERTION OF DIALYSIS CATHETER     LEG SURGERY  1987    Prior to Admission medications   Medication Sig Start Date End Date Taking? Authorizing Provider  amLODipine  (NORVASC ) 10 MG tablet Take 1 tablet by mouth daily. 05/18/20   [provider]  Ascorbic Acid (VITAMIN C PO) Take by mouth.    [provider]  blood glucose meter kit and supplies KIT Dispense based on patient and insurance preference. Use up to four times daily as directed. (FOR ICD-9 250.00, 250.01). 09/01/18   Tobie Press, MD  calcium  acetate (PHOSLO ) 667 MG capsule Take 1,334 mg by mouth 3 (three) times a week. 02/04/21   [provider]   furosemide  (LASIX ) 80 MG tablet Take 80 mg by mouth daily. 01/25/21   [provider]  hydrALAZINE  (APRESOLINE ) 25 MG tablet Take 25 mg by mouth 3 (three) times daily. 01/25/21   [provider]  irbesartan  (AVAPRO ) 300 MG tablet Take 300 mg by mouth daily. 02/04/21   [provider]  Multiple Vitamin (MULTIVITAMIN) tablet Take 1 tablet by mouth daily.    [provider]  tamsulosin  (FLOMAX ) 0.4 MG CAPS capsule Take 0.4 mg by mouth daily. 01/30/21   [provider]  VITAMIN E PO Take by mouth.    [provider]    Allergies as of 08/26/2023   (No Known Allergies)    Family History  Problem Relation Age of Onset   Diabetes Mellitus II Sister    Diabetes Mellitus II Paternal Grandmother     Social History   Socioeconomic History   Marital status: Single    Spouse name: Not on file   Number of children: Not on file   Years of education: Not on file   Highest education level: Not on file  Occupational History   Not on file  Tobacco Use   Smoking status: Never   Smokeless tobacco: Never  Vaping Use   Vaping status: Never Used  Substance and Sexual Activity   Alcohol use: Yes    Alcohol/week: 7.0 standard drinks of alcohol    Types: 7 Cans of beer per week   Drug use: Yes    Types: Cocaine , Marijuana    Comment: 09/24/23 - Marijuana 3x/week. No cocaine   since Friday 08/27/2018   Sexual activity: Not on file  Other Topics Concern   Not on file  Social History Narrative   Not on file   Social Drivers of Health   Financial Resource Strain: Low Risk  (05/15/2020)   Received from [facility], [facility]   Overall Financial Resource Strain (CARDIA)    Difficulty of Paying Living Expenses: Not very hard  Food Insecurity: No Food Insecurity (05/15/2020)   Received from [facility], [facility]   Hunger Vital Sign    Worried About Running Out of Food in the Last Year: Never true    Ran Out of Food in the Last  Year: Never true  Transportation Needs: No Transportation Needs (05/15/2020)   Received from [facility], [facility]   [facility]    Lack of Transportation (Medical): No    Lack of Transportation (Non-Medical): No  Physical Activity: Not on file  Stress: Not on file  Social Connections: Not on file  Intimate Partner Violence: Not on file    Review of Systems: See HPI, otherwise negative ROS  Physical Exam: There were no vitals taken for this visit. General:   Alert, cooperative in NAD Head:  Normocephalic and atraumatic. Respiratory:  Normal work of breathing. Cardiovascular:  RRR  Impression/Plan: Casey Franco is here for cataract surgery.  Risks, benefits, limitations, and alternatives regarding cataract surgery have been reviewed with the patient.  Questions have been answered.  All parties agreeable.   Feliciano Bryan Ober, MD  10/22/2023, 7:15 AM

## 2023-10-22 NOTE — Op Note (Signed)
 OPERATIVE NOTE  Casey Franco [id number] 10/22/2023   PREOPERATIVE DIAGNOSIS: Nuclear sclerotic cataract left eye. H25.12   POSTOPERATIVE DIAGNOSIS: Nuclear sclerotic cataract left eye. H25.12   PROCEDURE:  Phacoemusification with posterior chamber intraocular lens placement of the left eye  Ultrasound time: Procedure(s) with comments: CATARACT EXTRACTION PHACO AND INTRAOCULAR LENS PLACEMENT (IOC) LEFT DIABETIC MALYUGIN (Left) - 7.66 0:54.2  LENS:   Implant Name Type Inv. Item Serial No. Manufacturer Lot No. LRB No. Used Action  LENS CLAREON WAGON WHEEL 22.5 - [id number] Intraocular Lens LENS CLAREON WAGON WHEEL 22.5 [id number] ALCON  Left 1 Implanted      SURGEON:  Feliciano HERO. Enola, MD   ANESTHESIA:  Topical with tetracaine  drops, augmented with 1% preservative-free intracameral lidocaine .   COMPLICATIONS:  None.   DESCRIPTION OF PROCEDURE:  The patient was identified in the holding room and transported to the operating room and placed in the supine position under the operating microscope.  The left eye was identified as the operative eye, which was prepped and draped in the usual sterile ophthalmic fashion.   A 1 millimeter clear-corneal paracentesis was made inferotemporally. Preservative-free 1% lidocaine  mixed with 1:1,000 bisulfite-free aqueous solution of epinephrine  was injected into the anterior chamber. The anterior chamber was then filled with Viscoat viscoelastic. A 2.4 millimeter keratome was used to make a clear-corneal incision superotemporally. A curvilinear capsulorrhexis was made with a cystotome and capsulorrhexis forceps. Balanced salt  solution was used to hydrodissect and hydrodelineate the nucleus. Phacoemulsification was then used to remove the lens nucleus and epinucleus. The remaining cortex was then removed using the irrigation and aspiration handpiece. Provisc was then placed into the capsular bag to distend it for lens placement. A +22.50 D [id number]  intraocular lens was then injected into the capsular bag. The remaining viscoelastic was aspirated.   Wounds were hydrated with balanced salt  solution.  The anterior chamber was inflated to a physiologic pressure with balanced salt  solution.  No wound leaks were noted. Vigamox  was injected intracamerally.  Timolol  and Brimonidine  drops were applied to the eye.  The patient was taken to the recovery room in stable condition without complications of anesthesia or surgery.  [name] 10/22/2023, 12:44 PM

## 2023-10-23 ENCOUNTER — Encounter: Payer: Self-pay | Admitting: Ophthalmology

## 2023-10-23 NOTE — Anesthesia Postprocedure Evaluation (Signed)
 Anesthesia Post Note  Patient: Casey Franco  Procedure(s) Performed: CATARACT EXTRACTION PHACO AND INTRAOCULAR LENS PLACEMENT (IOC) LEFT DIABETIC MALYUGIN (Left: Eye)  Patient location during evaluation: PACU Anesthesia Type: MAC Level of consciousness: awake and alert Pain management: pain level controlled Vital Signs Assessment: post-procedure vital signs reviewed and stable Respiratory status: spontaneous breathing, nonlabored ventilation, respiratory function stable and patient connected to nasal cannula oxygen Cardiovascular status: stable and blood pressure returned to baseline Postop Assessment: no apparent nausea or vomiting Anesthetic complications: no   No notable events documented.   Last Vitals:  Vitals:   10/22/23 1245 10/22/23 1251  BP:  (!) 168/94  Pulse: 84 80  Resp: 13 13  Temp: 36.7 C 36.7 C  SpO2: 96% 96%    Last Pain:  Vitals:   10/23/23 1055  TempSrc:   PainSc: 0-No pain                 Mihail Prettyman C Westin Knotts

## 2023-11-12 ENCOUNTER — Emergency Department: Payer: 59

## 2023-11-12 ENCOUNTER — Other Ambulatory Visit: Payer: Self-pay

## 2023-11-12 ENCOUNTER — Encounter: Payer: Self-pay | Admitting: Intensive Care

## 2023-11-12 ENCOUNTER — Emergency Department
Admission: EM | Admit: 2023-11-12 | Discharge: 2023-11-12 | Disposition: A | Discharge status: Home / Self Care | Payer: 59 | Attending: Emergency Medicine | Admitting: Emergency Medicine

## 2023-11-12 DIAGNOSIS — N186 End stage renal disease: Secondary | ICD-10-CM | POA: Diagnosis not present

## 2023-11-12 DIAGNOSIS — R1084 Generalized abdominal pain: Secondary | ICD-10-CM | POA: Diagnosis present

## 2023-11-12 DIAGNOSIS — Z992 Dependence on renal dialysis: Secondary | ICD-10-CM | POA: Diagnosis not present

## 2023-11-12 DIAGNOSIS — K5900 Constipation, unspecified: Secondary | ICD-10-CM | POA: Diagnosis not present

## 2023-11-12 DIAGNOSIS — I120 Hypertensive chronic kidney disease with stage 5 chronic kidney disease or end stage renal disease: Secondary | ICD-10-CM | POA: Diagnosis not present

## 2023-11-12 LAB — CBC WITH DIFFERENTIAL/PLATELET
Abs Immature Granulocytes: 0.02 10*3/uL (ref 0.00–0.07)
Basophils Absolute: 0 10*3/uL (ref 0.0–0.1)
Basophils Relative: 0 %
Eosinophils Absolute: 0 10*3/uL (ref 0.0–0.5)
Eosinophils Relative: 0 %
HCT: 33.5 % — ABNORMAL LOW (ref 39.0–52.0)
Hemoglobin: 11.5 g/dL — ABNORMAL LOW (ref 13.0–17.0)
Immature Granulocytes: 0 %
Lymphocytes Relative: 4 %
Lymphs Abs: 0.3 10*3/uL — ABNORMAL LOW (ref 0.7–4.0)
MCH: 33.2 pg (ref 26.0–34.0)
MCHC: 34.3 g/dL (ref 30.0–36.0)
MCV: 96.8 fL (ref 80.0–100.0)
Monocytes Absolute: 1.3 10*3/uL — ABNORMAL HIGH (ref 0.1–1.0)
Monocytes Relative: 14 %
Neutro Abs: 7.6 10*3/uL (ref 1.7–7.7)
Neutrophils Relative %: 82 %
Platelets: 178 10*3/uL (ref 150–400)
RBC: 3.46 MIL/uL — ABNORMAL LOW (ref 4.22–5.81)
RDW: 14.3 % (ref 11.5–15.5)
WBC: 9.2 10*3/uL (ref 4.0–10.5)
nRBC: 0 % (ref 0.0–0.2)

## 2023-11-12 LAB — COMPREHENSIVE METABOLIC PANEL
ALT: 16 U/L (ref 0–44)
AST: 25 U/L (ref 15–41)
Albumin: 3.4 g/dL — ABNORMAL LOW (ref 3.5–5.0)
Alkaline Phosphatase: 96 U/L (ref 38–126)
Anion gap: 20 — ABNORMAL HIGH (ref 5–15)
BUN: 71 mg/dL — ABNORMAL HIGH (ref 8–23)
CO2: 19 mmol/L — ABNORMAL LOW (ref 22–32)
Calcium: 9.1 mg/dL (ref 8.9–10.3)
Chloride: 98 mmol/L (ref 98–111)
Creatinine, Ser: 12.78 mg/dL — ABNORMAL HIGH (ref 0.61–1.24)
GFR, Estimated: 4 mL/min — ABNORMAL LOW (ref >60)
Glucose, Bld: 91 mg/dL (ref 70–99)
Potassium: 4.1 mmol/L (ref 3.5–5.1)
Sodium: 137 mmol/L (ref 135–145)
Total Bilirubin: 2 mg/dL — ABNORMAL HIGH (ref 0.0–1.2)
Total Protein: 8.5 g/dL — ABNORMAL HIGH (ref 6.5–8.1)

## 2023-11-12 MED ORDER — SENNOSIDES-DOCUSATE SODIUM 8.6-50 MG PO TABS: 2.0000 | ORAL_TABLET | Freq: Two times a day (BID) | ORAL | 0 refills | Status: DC

## 2023-11-12 MED ORDER — MAGNESIUM CITRATE PO SOLN
1.0000 | Freq: Once | ORAL | Status: AC
  Administered 2023-11-12: 1 via ORAL
  Filled 2023-11-12: qty 296

## 2023-11-12 MED ORDER — POLYETHYLENE GLYCOL 3350 17 GM/SCOOP PO POWD: ORAL | 0 refills | Status: DC

## 2023-11-12 NOTE — ED Provider Notes (Signed)
[facility] Provider Note    Event Date/Time   First MD Initiated Contact with Patient 11/12/23 1149     (approximate)   History   Chief Complaint: Constipation   HPI  Casey Franco is a 63 y.o. male with a history of ESRD on hemodialysis, hypertension, chronic anemia, hepatitis C who comes ED complaining of constipation and bloating for the past 5 days.  Tried taking Imodium to help with his symptoms, but has not had a bowel movement since then.  Has generalized abdominal discomfort.  No vomiting, no fevers chills chest pain or shortness of breath.  No lower extremity edema.  Went to dialysis 3 days ago, did not go yesterday due to feeling ill.          Physical Exam   Triage Vital Signs: ED Triage Vitals  Encounter Vitals Group     BP 11/12/23 0954 (!) 193/118     Systolic BP Percentile --      Diastolic BP Percentile --      Pulse Rate 11/12/23 0951 (!) 103     Resp 11/12/23 0951 18     Temp 11/12/23 0951 98.9 F (37.2 C)     Temp Source 11/12/23 0951 Oral     SpO2 11/12/23 0951 95 %     Weight 11/12/23 0953 160 lb (72.6 kg)     Height 11/12/23 0953 6' (1.829 m)     Head Circumference --      Peak Flow --      Pain Score 11/12/23 0953 4     Pain Loc --      Pain Education --      Exclude from Growth Chart --     Most recent vital signs: Vitals:   11/12/23 0951 11/12/23 0954  BP:  (!) 193/118  Pulse: (!) 103   Resp: 18   Temp: 98.9 F (37.2 C)   SpO2: 95%     General: Awake, no distress.  CV:  Good peripheral perfusion.  Regular rate rhythm Resp:  Normal effort.  Clear to auscultation bilaterally Abd:  No distention.  Soft, mild generalized tenderness.  Not tympanitic. Other:  No lower extremity edema.  No JVD, no orthopnea   ED Results / Procedures / Treatments   Labs (all labs ordered are listed, but only abnormal results are displayed) Labs Reviewed  COMPREHENSIVE METABOLIC PANEL - Abnormal; Notable for the  following components:      Result Value   CO2 19 (*)    BUN 71 (*)    Creatinine, Ser 12.78 (*)    Total Protein 8.5 (*)    Albumin 3.4 (*)    Total Bilirubin 2.0 (*)    GFR, Estimated 4 (*)    Anion gap 20 (*)    All other components within normal limits  CBC WITH DIFFERENTIAL/PLATELET - Abnormal; Notable for the following components:   RBC 3.46 (*)    Hemoglobin 11.5 (*)    HCT 33.5 (*)    Lymphs Abs 0.3 (*)    Monocytes Absolute 1.3 (*)    All other components within normal limits     EKG    RADIOLOGY CT abdomen pelvis interpreted by me, negative for bowel obstruction or free air.  Does show ascites.  Radiology report reviewed   PROCEDURES:  Procedures   MEDICATIONS ORDERED IN ED: Medications  magnesium citrate solution 1 Bottle (1 Bottle Oral Given 11/12/23 1344)     IMPRESSION / MDM /  ASSESSMENT AND PLAN / ED COURSE  I reviewed the triage vital signs and the nursing notes.  DDx: Small bowel obstruction, constipation, fecal impaction, electrolyte derangement  Patient's presentation is most consistent with acute presentation with potential threat to life or bodily function.  Patient presents with abdominal pain, mild distention.  Has severe comorbidities.  Will obtain CT and labs.   Clinical Course as of 11/12/23 1540  Thu Nov 12, 2023  1300 CT unremarkable, shows some ascites but no signs of obstruction.  Constipation may be worsened by his use of Imodium, will try magnesium citrate to alleviate symptoms [PS]    Clinical Course User Index [PS] Sharman Cheek, MD    ----------------------------------------- 1:32 PM on 11/12/2023 ----------------------------------------- Patient reports that he passed gas and feels much better.  Has not had a bowel movement in the ED, but CT is reassuring.  Abdomen is soft, nontender.  Labs reassuring.  He has routine dialysis tomorrow morning 6 AM.  Stable for discharge, MiraLAX and Senokot at home.  Counseled to  avoid Imodium.   FINAL CLINICAL IMPRESSION(S) / ED DIAGNOSES   Final diagnoses:  Constipation, unspecified constipation type  ESRD on hemodialysis (HCC)     Rx / DC Orders   ED Discharge Orders          Ordered    polyethylene glycol powder (GLYCOLAX/MIRALAX) 17 GM/SCOOP powder        11/12/23 1331    senna-docusate (SENOKOT-S) 8.6-50 MG tablet  2 times daily        11/12/23 1331             Note:  This document was prepared using Dragon voice recognition software and may include unintentional dictation errors.   Sharman Cheek, MD 11/12/23 1540

## 2023-11-12 NOTE — ED Notes (Signed)
Patient declined discharge vital signs.

## 2023-11-12 NOTE — ED Notes (Signed)
To CT scan

## 2023-11-12 NOTE — ED Notes (Signed)
Pharmacy was messaged for medication.

## 2023-11-12 NOTE — ED Provider Triage Note (Signed)
Emergency Medicine Provider Triage Evaluation Note  Casey Franco , a 63 y.o. male  was evaluated in triage.  Pt complains of abd pain with constipation x 4 days.  Also missed dialysis yesterday.  Usually goes Monday Wednesday Friday.  Review of Systems  Positive: constipation Negative:   Physical Exam  There were no vitals taken for this visit. Gen:   Awake, no distress   Resp:  Normal effort  MSK:   Moves extremities without difficulty  Other:    Medical Decision Making  Medically screening exam initiated at 9:50 AM.  Appropriate orders placed.  FINN AMOS was informed that the remainder of the evaluation will be completed by another provider, this initial triage assessment does not replace that evaluation, and the importance of remaining in the ED until their evaluation is complete.     Tommi Rumps, PA-C 11/12/23 [phone]

## 2023-11-12 NOTE — ED Triage Notes (Signed)
Patient c/o constipation since Sunday. Reports is he dialysis patient M,W,F. Missed dialysis Wednsesday 11/11/23. Access is in right chest.   Has tried OTC meds with no relief.   A&O x4 during triage

## 2023-11-19 ENCOUNTER — Encounter: Payer: Self-pay | Admitting: Intensive Care

## 2023-11-19 ENCOUNTER — Emergency Department: Payer: 59

## 2023-11-19 ENCOUNTER — Emergency Department
Admission: EM | Admit: 2023-11-19 | Discharge: 2023-11-19 | Disposition: A | Discharge status: Home / Self Care | Payer: 59 | Attending: Emergency Medicine | Admitting: Emergency Medicine

## 2023-11-19 ENCOUNTER — Other Ambulatory Visit: Payer: Self-pay

## 2023-11-19 DIAGNOSIS — R1084 Generalized abdominal pain: Secondary | ICD-10-CM

## 2023-11-19 DIAGNOSIS — R188 Other ascites: Secondary | ICD-10-CM | POA: Diagnosis not present

## 2023-11-19 DIAGNOSIS — D72829 Elevated white blood cell count, unspecified: Secondary | ICD-10-CM | POA: Diagnosis not present

## 2023-11-19 DIAGNOSIS — E860 Dehydration: Secondary | ICD-10-CM | POA: Insufficient documentation

## 2023-11-19 DIAGNOSIS — K7460 Unspecified cirrhosis of liver: Secondary | ICD-10-CM | POA: Diagnosis not present

## 2023-11-19 DIAGNOSIS — N186 End stage renal disease: Secondary | ICD-10-CM | POA: Insufficient documentation

## 2023-11-19 DIAGNOSIS — R9431 Abnormal electrocardiogram [ECG] [EKG]: Secondary | ICD-10-CM | POA: Diagnosis not present

## 2023-11-19 DIAGNOSIS — R531 Weakness: Secondary | ICD-10-CM

## 2023-11-19 DIAGNOSIS — Z20822 Contact with and (suspected) exposure to covid-19: Secondary | ICD-10-CM | POA: Diagnosis not present

## 2023-11-19 DIAGNOSIS — E1122 Type 2 diabetes mellitus with diabetic chronic kidney disease: Secondary | ICD-10-CM | POA: Insufficient documentation

## 2023-11-19 DIAGNOSIS — Z992 Dependence on renal dialysis: Secondary | ICD-10-CM | POA: Insufficient documentation

## 2023-11-19 DIAGNOSIS — I120 Hypertensive chronic kidney disease with stage 5 chronic kidney disease or end stage renal disease: Secondary | ICD-10-CM | POA: Diagnosis not present

## 2023-11-19 LAB — CBC
HCT: 33.5 % — ABNORMAL LOW (ref 39.0–52.0)
Hemoglobin: 11.1 g/dL — ABNORMAL LOW (ref 13.0–17.0)
MCH: 31.8 pg (ref 26.0–34.0)
MCHC: 33.1 g/dL (ref 30.0–36.0)
MCV: 96 fL (ref 80.0–100.0)
Platelets: 280 10*3/uL (ref 150–400)
RBC: 3.49 MIL/uL — ABNORMAL LOW (ref 4.22–5.81)
RDW: 14.6 % (ref 11.5–15.5)
WBC: 13.6 10*3/uL — ABNORMAL HIGH (ref 4.0–10.5)
nRBC: 0 % (ref 0.0–0.2)

## 2023-11-19 LAB — BASIC METABOLIC PANEL
Anion gap: 24 — ABNORMAL HIGH (ref 5–15)
BUN: 44 mg/dL — ABNORMAL HIGH (ref 8–23)
CO2: 18 mmol/L — ABNORMAL LOW (ref 22–32)
Calcium: 8.9 mg/dL (ref 8.9–10.3)
Chloride: 96 mmol/L — ABNORMAL LOW (ref 98–111)
Creatinine, Ser: 7.72 mg/dL — ABNORMAL HIGH (ref 0.61–1.24)
GFR, Estimated: 7 mL/min — ABNORMAL LOW (ref >60)
Glucose, Bld: 83 mg/dL (ref 70–99)
Potassium: 4 mmol/L (ref 3.5–5.1)
Sodium: 138 mmol/L (ref 135–145)

## 2023-11-19 LAB — HEPATIC FUNCTION PANEL
ALT: 16 U/L (ref 0–44)
AST: 29 U/L (ref 15–41)
Albumin: 2.9 g/dL — ABNORMAL LOW (ref 3.5–5.0)
Alkaline Phosphatase: 111 U/L (ref 38–126)
Bilirubin, Direct: 0.9 mg/dL — ABNORMAL HIGH (ref 0.0–0.2)
Indirect Bilirubin: 0.9 mg/dL (ref 0.3–0.9)
Total Bilirubin: 1.8 mg/dL — ABNORMAL HIGH (ref 0.0–1.2)
Total Protein: 7.8 g/dL (ref 6.5–8.1)

## 2023-11-19 LAB — TROPONIN I (HIGH SENSITIVITY)
Troponin I (High Sensitivity): 109 ng/L (ref <18)
Troponin I (High Sensitivity): 115 ng/L (ref <18)

## 2023-11-19 LAB — RESP PANEL BY RT-PCR (RSV, FLU A&B, COVID)  RVPGX2
Influenza A by PCR: NEGATIVE
Influenza B by PCR: NEGATIVE
Resp Syncytial Virus by PCR: NEGATIVE
SARS Coronavirus 2 by RT PCR: NEGATIVE

## 2023-11-19 MED ORDER — LACTATED RINGERS IV BOLUS
500.0000 mL | Freq: Once | INTRAVENOUS | Status: AC
  Administered 2023-11-19: 500 mL via INTRAVENOUS

## 2023-11-19 NOTE — ED Notes (Signed)
 Dr Drenda Gentle notified of trop 109. Orders to be placed as needed.

## 2023-11-19 NOTE — ED Notes (Signed)
Pt given warm blanket as requested.  

## 2023-11-19 NOTE — ED Triage Notes (Signed)
 Patient reports he is here for dehydration. Dialysis patient M,W,F. Reports he finished treatment yesterday. Access in right right chest  Reports his skin is dry and he is weak.

## 2023-11-19 NOTE — ED Provider Notes (Signed)
 [facility] Provider Note    Event Date/Time   First MD Initiated Contact with Patient 11/19/23 [phone]     (approximate)   History   Dehydration   HPI  Casey Franco is a 63 y.o. male with history of ESRD on dialysis Monday Wednesday Friday, diabetes, hypertension, presenting with generalized weakness.  States that he went to dialysis yesterday, was told that there was no fluid to pull, did complete his dialysis but did not take out fluid.  Patient states that he has had decreased p.o. intake for the past week.  Has no appetite.  Was seen in the emergency department on the 30th for abdominal bloating and constipation.  Had taken Imodium prior to that.  Had a CT that did not show any obstruction, was given mag citrate as well as laxatives for home.  Patient states that he is passing gas but has not had a proper bowel movement.  No nausea, vomiting, chest pain, shortness of breath.  States that he has some mild diffuse abdominal for but this is unchanged compared to prior and has been ongoing for at least a month.  States he only produces a couple drops of urine.  Has not voided spontaneously since he started dialysis 3 years ago.  He denies any fever, cough.  No back pain.   On independent review he had imaging of his abdomen done, no bowel obstruction noted.  CT also noted cirrhosis with ascites.  Physical Exam   Triage Vital Signs: ED Triage Vitals  Encounter Vitals Group     BP 11/19/23 0852 (!) 194/109     Systolic BP Percentile --      Diastolic BP Percentile --      Pulse Rate 11/19/23 0852 98     Resp 11/19/23 0852 18     Temp 11/19/23 0852 98.6 F (37 C)     Temp Source 11/19/23 0852 Oral     SpO2 11/19/23 0852 96 %     Weight 11/19/23 0850 150 lb (68 kg)     Height 11/19/23 0850 6' (1.829 m)     Head Circumference --      Peak Flow --      Pain Score 11/19/23 0850 2     Pain Loc --      Pain Education --      Exclude from Growth Chart --      Most recent vital signs: Vitals:   11/19/23 0852 11/19/23 1401  BP: (!) 194/109 (!) 182/107  Pulse: 98 95  Resp: 18 18  Temp: 98.6 F (37 C) 98 F (36.7 C)  SpO2: 96% 97%     General: Awake, no distress.  CV:  Good peripheral perfusion.  Resp:  Normal effort.  Clear bilaterally Abd:  Soft, distended, mild generalized tenderness to palpation, no guarding or rebound Other:  Dry mucous membranes.   ED Results / Procedures / Treatments   Labs (all labs ordered are listed, but only abnormal results are displayed) Labs Reviewed  BASIC METABOLIC PANEL - Abnormal; Notable for the following components:      Result Value   Chloride 96 (*)    CO2 18 (*)    BUN 44 (*)    Creatinine, Ser 7.72 (*)    GFR, Estimated 7 (*)    Anion gap 24 (*)    All other components within normal limits  CBC - Abnormal; Notable for the following components:   WBC 13.6 (*)  RBC 3.49 (*)    Hemoglobin 11.1 (*)    HCT 33.5 (*)    All other components within normal limits  HEPATIC FUNCTION PANEL - Abnormal; Notable for the following components:   Albumin  2.9 (*)    Total Bilirubin 1.8 (*)    Bilirubin, Direct 0.9 (*)    All other components within normal limits  TROPONIN I (HIGH SENSITIVITY) - Abnormal; Notable for the following components:   Troponin I (High Sensitivity) 109 (*)    All other components within normal limits  TROPONIN I (HIGH SENSITIVITY) - Abnormal; Notable for the following components:   Troponin I (High Sensitivity) 115 (*)    All other components within normal limits  RESP PANEL BY RT-PCR (RSV, FLU A&B, COVID)  [id number]     EKG  EKG showed sinus rhythm with sinus arrhythmia, rate of 98, there is nonspecific intraventricular conduction delay, widened QRS, T wave flattening in aVL, baseline is wandering but otherwise no obvious ischemic ST elevation, compared to prior EKGs, he does have history of borderline Qtc  REPEAT EKG: Sinus rhythm with PACs, rate of 93, normal  QRS, prolonged QTc, T wave finding to aVL, no ischemic ST elevation, not significant change compared to prior   RADIOLOGY CT did not show any free air my interpretation   PROCEDURES:  Critical Care performed: No  Procedures   MEDICATIONS ORDERED IN ED: Medications  lactated ringers  bolus 500 mL (0 mLs Intravenous Stopped 11/19/23 1358)     IMPRESSION / MDM / ASSESSMENT AND PLAN / ED COURSE  I reviewed the triage vital signs and the nursing notes.                              Differential diagnosis includes, but is not limited to, SBO, partial SBO, constipation, considered SBP given history of ascites but patient's abdominal pain is not new, no fevers, no altered mental status.  Also consider dehydration, electrolyte derangements.  Atypical ACS.  Will get labs, EKG, troponin, IV fluids, CT abdomen pelvis.  Patient's presentation is most consistent with acute presentation with potential threat to life or bodily function.  Discussed with patient about his prior CT scan results showing cirrhosis with ascites, patient states no prior history of cirrhosis, he is being watched by his gastroenterologist does have history of hep C.  Was try to get appointment with them but they are only able to see him in late March.  Will try to contact gastroenterology here to see if we are able to schedule a faster follow-up appointment.  His troponin was also found to be elevated to 109.  He has no chest pain or shortness of breath breath at this time.  Will get a second troponin.  Repeat EKG is also nonischemic.  Independent review of labs and imaging, CT did not show any obstruction, show cirrhosis with ascites.  He has a mild leukocytosis but no other infectious symptoms, respiratory viral panel is negative, electrolytes are not severely deranged, his creatinine is elevated but this is consistent with his ESRD.  His troponin is elevated, the second troponin delta was not more than 15.  Suspect elevated  troponin is due to his renal clearance. Consult GI here to see if we can get an earlier appointment for him, they are fortunately now GI hospitalist, will send a message to his gastroenterologist to see if they can follow-up earlier.  Also instructed patient to call his gastroenterologist  office today to see if they have any openings earlier.  Otherwise on reassessment, his abdomen soft nontender, he has no chest pain or shortness of breath to suggest ongoing ACS.  No lightheadedness.  He is tolerating p.o.  He has stopped taking his MiraLAX , instructed him to restart taking his MiraLAX  and also to try over-the-counter Benefiber.  Considered but no indication for inpatient admission at this time, he is safe for outpatient management.  Strict return precautions given.  Will discharge.  Clinical Course as of 11/19/23 1409  Thu Nov 19, 2023  0940 DG Chest 2 View No active cardiopulmonary disease.  [TT]  [phone] DG Chest 2 View No active cardiopulmonary disease.  [TT]  1057 CT ABDOMEN PELVIS WO CONTRAST IMPRESSION: No acute findings.  Cirrhosis and large amount of ascites, similar to prior exam.  Stable bilateral diffuse renal parenchymal atrophy. No hydronephrosis.      [TT]    Clinical Course User Index [TT] Waymond Lorelle Cummins, MD     FINAL CLINICAL IMPRESSION(S) / ED DIAGNOSES   Final diagnoses:  Generalized abdominal pain  Weakness  Dehydration  Cirrhosis of liver with ascites, unspecified hepatic cirrhosis type (HCC)  Prolonged QT interval     Rx / DC Orders   ED Discharge Orders     None        Note:  This document was prepared using Dragon voice recognition software and may include unintentional dictation errors.    Waymond Lorelle Cummins, MD 11/19/23 [phone]

## 2023-11-19 NOTE — Discharge Instructions (Signed)
 Your evaluated today for weakness, dehydration.  You are given fluids.  Your CAT scan noted that you have no liver cirrhosis with some ascites.  Follow-up and call your gastroenterologist office today to set up a closer follow-up appointment for further management of these findings.  Please return if you have fever, nausea vomiting, if you are not passing gas, if you have severe abdominal pain, or if you have any additional concerns.

## 2023-11-20 ENCOUNTER — Telehealth: Payer: Self-pay

## 2023-11-20 DIAGNOSIS — R188 Other ascites: Secondary | ICD-10-CM

## 2023-11-20 MED ORDER — ALBUMIN HUMAN 25 % IV SOLN: INTRAVENOUS | Status: DC

## 2023-11-20 NOTE — Telephone Encounter (Signed)
 Order the Ultrasound with Liver doppler and order the Ultrasound Paracentesis. Tried to call Clarita at [phone] to get the paracentesis schedule but the man that answered said she was off today. He said if we needed  it as soon as possible to call the ultrasound department at [phone]. I faxed the order to Clarita at [phone] and got confirmation fax went through.

## 2023-11-20 NOTE — Telephone Encounter (Signed)
 I called and got patient Ultrasound with Liver Doppler schedule for 11/25/2023 arrive to medical mall at 7:15am for a 7:30am scan. Nothing to eat or drink after midnight. Made him a appointment to see Dr. Unk on Tuesday at 1:00pm. Called patient and patient verbalized understanding of appointments and times. He did say he does Dialysis on Monday Wednesday and Fridays mornings. Informed him I would call and moved the Ultrasound to a different day the. He said no he will keep the appointment because he needs to get this fluid off

## 2023-11-20 NOTE — Telephone Encounter (Signed)
-----   Message from [facility] sent at 11/19/2023  4:19 PM EST ----- Regarding: RE: Trying to get earlier appointment for patient Rosina  I can see him on Tuesday next week around 1030/11 AM for cirrhosis of liver Please go ahead and schedule ultrasound liver with Dopplers, paracentesis with ascitic fluid analysis  RV ----- Message ----- From: [place] Lorelle Cummins, MD Sent: 11/19/2023   2:13 PM EST To: Corinn Jess Brooklyn, MD Subject: Trying to get earlier appointment for patient  Hi Dr. Brooklyn,  I wanted to reach out to see if Mr. Chavarria could be seen earlier than his March appointment. He had a CT scan done last week and this week and found to have liver cirrhosis and ascites. I don't see a prior history of cirrhosis in his notes although I do see the history of Hep C. I spoke to Dr. Jinny and he recommended I send a letter. I've shared the results with the patient and he knows to call the clinic as well to see if he can get closer follow up. Thank you for your time!  Best, Lorelle Cummins [place]

## 2023-11-23 NOTE — Telephone Encounter (Signed)
 Sent Leary Provencal a Message this morning to see if we could get the Paracentesis schedule for patient

## 2023-11-23 NOTE — Telephone Encounter (Signed)
 Per Casey Franco Patient needs to arrive to Heart [facility] V[facility] department at 7:45am for a 8:00am Paracentesis on 11/24/2023. Per Casey Franco If he gets here at 8a - he will start at 8:30a, it will take anywhere from 30 to 90 mins then he will go for albumin  and that will take about 2 hrs approx or it could be a little less. Called patient and left a message for call back

## 2023-11-23 NOTE — Telephone Encounter (Signed)
 Called and left a message for call back

## 2023-11-23 NOTE — Telephone Encounter (Signed)
 Patient return call and states he will call his sister to make sure she can bring him and if she can not he will call us  back. Patient verbalized understanding of instructions of nothing to eat or drink after midnight tonight, time and location.

## 2023-11-24 ENCOUNTER — Ambulatory Visit
Admission: RE | Admit: 2023-11-24 | Discharge: 2023-11-24 | Disposition: A | Discharge status: Home / Self Care | Payer: 59 | Source: Ambulatory Visit | Attending: Gastroenterology | Admitting: Gastroenterology

## 2023-11-24 ENCOUNTER — Ambulatory Visit
Admission: RE | Admit: 2023-11-24 | Discharge: 2023-11-24 | Disposition: A | Discharge status: Home / Self Care | Payer: 59 | Source: Ambulatory Visit | Attending: Gastroenterology

## 2023-11-24 ENCOUNTER — Other Ambulatory Visit: Payer: Self-pay

## 2023-11-24 ENCOUNTER — Ambulatory Visit: Payer: 59 | Admitting: Gastroenterology

## 2023-11-24 DIAGNOSIS — K7460 Unspecified cirrhosis of liver: Secondary | ICD-10-CM

## 2023-11-24 DIAGNOSIS — B182 Chronic viral hepatitis C: Secondary | ICD-10-CM | POA: Insufficient documentation

## 2023-11-24 DIAGNOSIS — R188 Other ascites: Secondary | ICD-10-CM | POA: Insufficient documentation

## 2023-11-24 DIAGNOSIS — N186 End stage renal disease: Secondary | ICD-10-CM | POA: Insufficient documentation

## 2023-11-24 DIAGNOSIS — R1110 Vomiting, unspecified: Secondary | ICD-10-CM | POA: Diagnosis not present

## 2023-11-24 DIAGNOSIS — A4150 Gram-negative sepsis, unspecified: Secondary | ICD-10-CM | POA: Diagnosis not present

## 2023-11-24 LAB — ALBUMIN, PLEURAL OR PERITONEAL FLUID: Albumin, Fluid: 1.6 g/dL

## 2023-11-24 LAB — BODY FLUID CELL COUNT WITH DIFFERENTIAL
Eos, Fluid: 0 %
Lymphs, Fluid: 4 %
Monocyte-Macrophage-Serous Fluid: 0 %
Neutrophil Count, Fluid: 96 %
Total Nucleated Cell Count, Fluid: 117 uL

## 2023-11-24 LAB — PROTEIN, PLEURAL OR PERITONEAL FLUID: Total protein, fluid: 4 g/dL

## 2023-11-24 MED ORDER — ALBUMIN HUMAN 25 % IV SOLN
25.0000 g | Freq: Once | INTRAVENOUS | Status: AC
  Administered 2023-11-24: 25 g via INTRAVENOUS

## 2023-11-24 MED ORDER — LIDOCAINE HCL (PF) 1 % IJ SOLN
10.0000 mL | Freq: Once | INTRAMUSCULAR | Status: AC
  Administered 2023-11-24: 10 mL via INTRADERMAL
  Filled 2023-11-24: qty 10

## 2023-11-24 MED ORDER — ALBUMIN HUMAN 25 % IV SOLN
INTRAVENOUS | Status: AC
  Filled 2023-11-24: qty 100

## 2023-11-24 NOTE — Procedures (Signed)
PROCEDURE SUMMARY:  Successful US guided paracentesis from right lateral abdomen.  Yielded 350 mL of amber fluid.  No immediate complications.  Patient tolerated well.  EBL = trace  Specimen sent for labs.  Krikor Willet Charmian Muff PA-C 11/24/2023 8:57 AM

## 2023-11-24 NOTE — Addendum Note (Signed)
Addended by: Radene Knee L on: 11/24/2023 02:07 PM   Modules accepted: Orders

## 2023-11-24 NOTE — Addendum Note (Signed)
Addended by: Radene Knee L on: 11/24/2023 02:41 PM   Modules accepted: Orders

## 2023-11-24 NOTE — Progress Notes (Signed)
Per Dr. Allegra Lai She wanted to add on a total protein to patient lab work for his paracentesis. Called the lab to see if this could be added on and they can. Order the test and they will run the lab

## 2023-11-25 ENCOUNTER — Ambulatory Visit: Payer: 59

## 2023-11-26 ENCOUNTER — Inpatient Hospital Stay: Payer: 59

## 2023-11-26 ENCOUNTER — Emergency Department: Payer: 59

## 2023-11-26 ENCOUNTER — Inpatient Hospital Stay
Admission: EM | Admit: 2023-11-26 | Discharge: 2023-12-23 | DRG: 871 | Disposition: A | Discharge status: Home Health | Payer: 59 | Attending: Hospitalist | Admitting: Hospitalist

## 2023-11-26 ENCOUNTER — Other Ambulatory Visit: Payer: Self-pay

## 2023-11-26 DIAGNOSIS — I2489 Other forms of acute ischemic heart disease: Secondary | ICD-10-CM | POA: Diagnosis present

## 2023-11-26 DIAGNOSIS — G928 Other toxic encephalopathy: Secondary | ICD-10-CM | POA: Diagnosis not present

## 2023-11-26 DIAGNOSIS — Z515 Encounter for palliative care: Secondary | ICD-10-CM | POA: Diagnosis not present

## 2023-11-26 DIAGNOSIS — Z79899 Other long term (current) drug therapy: Secondary | ICD-10-CM

## 2023-11-26 DIAGNOSIS — E440 Moderate protein-calorie malnutrition: Secondary | ICD-10-CM | POA: Diagnosis present

## 2023-11-26 DIAGNOSIS — E1129 Type 2 diabetes mellitus with other diabetic kidney complication: Secondary | ICD-10-CM | POA: Insufficient documentation

## 2023-11-26 DIAGNOSIS — I700 Atherosclerosis of aorta: Secondary | ICD-10-CM | POA: Diagnosis present

## 2023-11-26 DIAGNOSIS — Z9841 Cataract extraction status, right eye: Secondary | ICD-10-CM

## 2023-11-26 DIAGNOSIS — A4150 Gram-negative sepsis, unspecified: Secondary | ICD-10-CM | POA: Diagnosis present

## 2023-11-26 DIAGNOSIS — I132 Hypertensive heart and chronic kidney disease with heart failure and with stage 5 chronic kidney disease, or end stage renal disease: Secondary | ICD-10-CM | POA: Diagnosis present

## 2023-11-26 DIAGNOSIS — Y92239 Unspecified place in hospital as the place of occurrence of the external cause: Secondary | ICD-10-CM | POA: Diagnosis not present

## 2023-11-26 DIAGNOSIS — D696 Thrombocytopenia, unspecified: Secondary | ICD-10-CM | POA: Insufficient documentation

## 2023-11-26 DIAGNOSIS — B182 Chronic viral hepatitis C: Secondary | ICD-10-CM | POA: Diagnosis present

## 2023-11-26 DIAGNOSIS — E785 Hyperlipidemia, unspecified: Secondary | ICD-10-CM | POA: Diagnosis present

## 2023-11-26 DIAGNOSIS — Z681 Body mass index (BMI) 19 or less, adult: Secondary | ICD-10-CM

## 2023-11-26 DIAGNOSIS — I340 Nonrheumatic mitral (valve) insufficiency: Secondary | ICD-10-CM | POA: Diagnosis present

## 2023-11-26 DIAGNOSIS — I5022 Chronic systolic (congestive) heart failure: Secondary | ICD-10-CM | POA: Diagnosis present

## 2023-11-26 DIAGNOSIS — I10 Essential (primary) hypertension: Secondary | ICD-10-CM | POA: Diagnosis not present

## 2023-11-26 DIAGNOSIS — K7460 Unspecified cirrhosis of liver: Secondary | ICD-10-CM | POA: Diagnosis present

## 2023-11-26 DIAGNOSIS — J189 Pneumonia, unspecified organism: Secondary | ICD-10-CM | POA: Diagnosis not present

## 2023-11-26 DIAGNOSIS — E1122 Type 2 diabetes mellitus with diabetic chronic kidney disease: Secondary | ICD-10-CM | POA: Diagnosis present

## 2023-11-26 DIAGNOSIS — T8249XA Other complication of vascular dialysis catheter, initial encounter: Secondary | ICD-10-CM | POA: Diagnosis not present

## 2023-11-26 DIAGNOSIS — R109 Unspecified abdominal pain: Secondary | ICD-10-CM | POA: Diagnosis present

## 2023-11-26 DIAGNOSIS — I5A Non-ischemic myocardial injury (non-traumatic): Secondary | ICD-10-CM | POA: Diagnosis not present

## 2023-11-26 DIAGNOSIS — R1110 Vomiting, unspecified: Secondary | ICD-10-CM

## 2023-11-26 DIAGNOSIS — I4892 Unspecified atrial flutter: Secondary | ICD-10-CM | POA: Diagnosis present

## 2023-11-26 DIAGNOSIS — Z7189 Other specified counseling: Secondary | ICD-10-CM | POA: Diagnosis not present

## 2023-11-26 DIAGNOSIS — R54 Age-related physical debility: Secondary | ICD-10-CM | POA: Diagnosis present

## 2023-11-26 DIAGNOSIS — Z9842 Cataract extraction status, left eye: Secondary | ICD-10-CM

## 2023-11-26 DIAGNOSIS — Z992 Dependence on renal dialysis: Secondary | ICD-10-CM | POA: Diagnosis not present

## 2023-11-26 DIAGNOSIS — I480 Paroxysmal atrial fibrillation: Secondary | ICD-10-CM | POA: Insufficient documentation

## 2023-11-26 DIAGNOSIS — K920 Hematemesis: Secondary | ICD-10-CM | POA: Diagnosis present

## 2023-11-26 DIAGNOSIS — N2581 Secondary hyperparathyroidism of renal origin: Secondary | ICD-10-CM | POA: Diagnosis present

## 2023-11-26 DIAGNOSIS — E871 Hypo-osmolality and hyponatremia: Secondary | ICD-10-CM | POA: Diagnosis present

## 2023-11-26 DIAGNOSIS — Z833 Family history of diabetes mellitus: Secondary | ICD-10-CM

## 2023-11-26 DIAGNOSIS — K652 Spontaneous bacterial peritonitis: Secondary | ICD-10-CM | POA: Diagnosis present

## 2023-11-26 DIAGNOSIS — R188 Other ascites: Secondary | ICD-10-CM | POA: Diagnosis present

## 2023-11-26 DIAGNOSIS — Z87891 Personal history of nicotine dependence: Secondary | ICD-10-CM

## 2023-11-26 DIAGNOSIS — R1310 Dysphagia, unspecified: Secondary | ICD-10-CM | POA: Diagnosis present

## 2023-11-26 DIAGNOSIS — I3139 Other pericardial effusion (noninflammatory): Secondary | ICD-10-CM | POA: Diagnosis present

## 2023-11-26 DIAGNOSIS — N186 End stage renal disease: Secondary | ICD-10-CM | POA: Diagnosis present

## 2023-11-26 DIAGNOSIS — N400 Enlarged prostate without lower urinary tract symptoms: Secondary | ICD-10-CM | POA: Diagnosis present

## 2023-11-26 DIAGNOSIS — T402X5A Adverse effect of other opioids, initial encounter: Secondary | ICD-10-CM | POA: Diagnosis not present

## 2023-11-26 DIAGNOSIS — L89151 Pressure ulcer of sacral region, stage 1: Secondary | ICD-10-CM | POA: Diagnosis present

## 2023-11-26 DIAGNOSIS — R011 Cardiac murmur, unspecified: Secondary | ICD-10-CM | POA: Diagnosis not present

## 2023-11-26 DIAGNOSIS — D631 Anemia in chronic kidney disease: Secondary | ICD-10-CM | POA: Diagnosis present

## 2023-11-26 DIAGNOSIS — A419 Sepsis, unspecified organism: Principal | ICD-10-CM

## 2023-11-26 DIAGNOSIS — K7469 Other cirrhosis of liver: Secondary | ICD-10-CM | POA: Diagnosis present

## 2023-11-26 DIAGNOSIS — L8990 Pressure ulcer of unspecified site, unspecified stage: Secondary | ICD-10-CM | POA: Insufficient documentation

## 2023-11-26 DIAGNOSIS — Z961 Presence of intraocular lens: Secondary | ICD-10-CM | POA: Diagnosis present

## 2023-11-26 DIAGNOSIS — Z751 Person awaiting admission to adequate facility elsewhere: Secondary | ICD-10-CM

## 2023-11-26 DIAGNOSIS — I493 Ventricular premature depolarization: Secondary | ICD-10-CM | POA: Diagnosis present

## 2023-11-26 LAB — CBC WITH DIFFERENTIAL/PLATELET
Abs Immature Granulocytes: 0.22 10*3/uL — ABNORMAL HIGH (ref 0.00–0.07)
Basophils Absolute: 0.1 10*3/uL (ref 0.0–0.1)
Basophils Relative: 0 %
Eosinophils Absolute: 0.3 10*3/uL (ref 0.0–0.5)
Eosinophils Relative: 2 %
HCT: 33.1 % — ABNORMAL LOW (ref 39.0–52.0)
Hemoglobin: 11 g/dL — ABNORMAL LOW (ref 13.0–17.0)
Immature Granulocytes: 1 %
Lymphocytes Relative: 2 %
Lymphs Abs: 0.4 10*3/uL — ABNORMAL LOW (ref 0.7–4.0)
MCH: 30.7 pg (ref 26.0–34.0)
MCHC: 33.2 g/dL (ref 30.0–36.0)
MCV: 92.5 fL (ref 80.0–100.0)
Monocytes Absolute: 1.5 10*3/uL — ABNORMAL HIGH (ref 0.1–1.0)
Monocytes Relative: 9 %
Neutro Abs: 13.4 10*3/uL — ABNORMAL HIGH (ref 1.7–7.7)
Neutrophils Relative %: 86 %
Platelets: 308 10*3/uL (ref 150–400)
RBC: 3.58 MIL/uL — ABNORMAL LOW (ref 4.22–5.81)
RDW: 14.7 % (ref 11.5–15.5)
WBC: 15.8 10*3/uL — ABNORMAL HIGH (ref 4.0–10.5)
nRBC: 0 % (ref 0.0–0.2)

## 2023-11-26 LAB — PROTIME-INR
INR: 1.9 — ABNORMAL HIGH (ref 0.8–1.2)
Prothrombin Time: 22.4 s — ABNORMAL HIGH (ref 11.4–15.2)

## 2023-11-26 LAB — TROPONIN I (HIGH SENSITIVITY)
Troponin I (High Sensitivity): 172 ng/L (ref <18)
Troponin I (High Sensitivity): 138 ng/L (ref <18)

## 2023-11-26 LAB — ALBUMIN, PLEURAL OR PERITONEAL FLUID: Albumin, Fluid: <1.5 g/dL

## 2023-11-26 LAB — AMMONIA: Ammonia: 67 umol/L — ABNORMAL HIGH (ref 9–35)

## 2023-11-26 LAB — COMPREHENSIVE METABOLIC PANEL
ALT: 16 U/L (ref 0–44)
AST: 30 U/L (ref 15–41)
Albumin: 2.8 g/dL — ABNORMAL LOW (ref 3.5–5.0)
Alkaline Phosphatase: 96 U/L (ref 38–126)
Anion gap: 24 — ABNORMAL HIGH (ref 5–15)
BUN: 102 mg/dL — ABNORMAL HIGH (ref 8–23)
CO2: 18 mmol/L — ABNORMAL LOW (ref 22–32)
Calcium: 9.4 mg/dL (ref 8.9–10.3)
Chloride: 100 mmol/L (ref 98–111)
Creatinine, Ser: 11.92 mg/dL — ABNORMAL HIGH (ref 0.61–1.24)
GFR, Estimated: 4 mL/min — ABNORMAL LOW (ref >60)
Glucose, Bld: 112 mg/dL — ABNORMAL HIGH (ref 70–99)
Potassium: 4.5 mmol/L (ref 3.5–5.1)
Sodium: 142 mmol/L (ref 135–145)
Total Bilirubin: 2.2 mg/dL — ABNORMAL HIGH (ref 0.0–1.2)
Total Protein: 7.5 g/dL (ref 6.5–8.1)

## 2023-11-26 LAB — HEMOGLOBIN A1C
Hgb A1c MFr Bld: 5.2 % (ref 4.8–5.6)
Mean Plasma Glucose: 102.54 mg/dL

## 2023-11-26 LAB — BODY FLUID CELL COUNT WITH DIFFERENTIAL
Eos, Fluid: 0 %
Lymphs, Fluid: 0 %
Monocyte-Macrophage-Serous Fluid: 32 % — ABNORMAL LOW (ref 50–90)
Neutrophil Count, Fluid: 68 % — ABNORMAL HIGH (ref 0–25)
Total Nucleated Cell Count, Fluid: 1326 uL — ABNORMAL HIGH (ref 0–1000)

## 2023-11-26 LAB — PROTEIN, PLEURAL OR PERITONEAL FLUID: Total protein, fluid: <3 g/dL

## 2023-11-26 LAB — LACTATE DEHYDROGENASE: LDH: 251 U/L — ABNORMAL HIGH (ref 98–192)

## 2023-11-26 LAB — CYTOLOGY - NON PAP

## 2023-11-26 LAB — GLUCOSE, PLEURAL OR PERITONEAL FLUID: Glucose, Fluid: <20 mg/dL

## 2023-11-26 LAB — LIPASE, BLOOD: Lipase: 35 U/L (ref 11–51)

## 2023-11-26 LAB — LACTIC ACID, PLASMA: Lactic Acid, Venous: 1.7 mmol/L (ref 0.5–1.9)

## 2023-11-26 MED ORDER — SODIUM CHLORIDE 0.9 % IV SOLN
2.0000 g | INTRAVENOUS | Status: DC
  Administered 2023-11-28: 2 g via INTRAVENOUS
  Filled 2023-11-26: qty 20

## 2023-11-26 MED ORDER — PANTOPRAZOLE SODIUM 40 MG IV SOLR
40.0000 mg | Freq: Once | INTRAVENOUS | Status: AC
  Administered 2023-11-26: 40 mg via INTRAVENOUS
  Filled 2023-11-26: qty 10

## 2023-11-26 MED ORDER — MORPHINE SULFATE (PF) 2 MG/ML IV SOLN
2.0000 mg | INTRAVENOUS | Status: DC | PRN
Start: 2023-11-26 — End: 2023-11-26

## 2023-11-26 MED ORDER — ONDANSETRON HCL 4 MG/2ML IJ SOLN
4.0000 mg | Freq: Once | INTRAMUSCULAR | Status: AC
  Administered 2023-11-26: 4 mg via INTRAVENOUS
  Filled 2023-11-26: qty 2

## 2023-11-26 MED ORDER — LIDOCAINE HCL (PF) 1 % IJ SOLN
10.0000 mL | Freq: Once | INTRAMUSCULAR | Status: AC
  Administered 2023-11-26: 10 mL via INTRADERMAL

## 2023-11-26 MED ORDER — IRBESARTAN 150 MG PO TABS
300.0000 mg | ORAL_TABLET | Freq: Every day | ORAL | Status: DC
  Administered 2023-11-27 – 2023-12-23 (×24): 300 mg via ORAL
  Filled 2023-11-26 (×26): qty 2

## 2023-11-26 MED ORDER — ONDANSETRON HCL 4 MG/2ML IJ SOLN
4.0000 mg | Freq: Three times a day (TID) | INTRAMUSCULAR | Status: DC | PRN
  Administered 2023-11-30: 4 mg via INTRAVENOUS
  Filled 2023-11-26: qty 2

## 2023-11-26 MED ORDER — ALBUMIN HUMAN 25 % IV SOLN
25.0000 g | Freq: Once | INTRAVENOUS | Status: AC
  Administered 2023-11-26: 25 g via INTRAVENOUS
  Filled 2023-11-26: qty 100

## 2023-11-26 MED ORDER — HYDRALAZINE HCL 20 MG/ML IJ SOLN
5.0000 mg | INTRAMUSCULAR | Status: DC | PRN
  Administered 2023-11-26: 5 mg via INTRAVENOUS
  Filled 2023-11-26: qty 1

## 2023-11-26 MED ORDER — AMLODIPINE BESYLATE 10 MG PO TABS
10.0000 mg | ORAL_TABLET | Freq: Every day | ORAL | Status: DC
  Administered 2023-11-27 – 2023-12-23 (×25): 10 mg via ORAL
  Filled 2023-11-26 (×21): qty 1
  Filled 2023-11-26: qty 2
  Filled 2023-11-26 (×4): qty 1

## 2023-11-26 MED ORDER — SODIUM CHLORIDE 0.9 % IV SOLN
500.0000 mg | Freq: Once | INTRAVENOUS | Status: AC
  Administered 2023-11-26: 500 mg via INTRAVENOUS
  Filled 2023-11-26: qty 5

## 2023-11-26 MED ORDER — FENTANYL CITRATE PF 50 MCG/ML IJ SOSY: 25.0000 ug | PREFILLED_SYRINGE | INTRAMUSCULAR | Status: DC | PRN

## 2023-11-26 MED ORDER — MORPHINE SULFATE (PF) 4 MG/ML IV SOLN
4.0000 mg | Freq: Once | INTRAVENOUS | Status: AC
  Administered 2023-11-26: 4 mg via INTRAVENOUS
  Filled 2023-11-26: qty 1

## 2023-11-26 MED ORDER — ACETAMINOPHEN 325 MG PO TABS
650.0000 mg | ORAL_TABLET | Freq: Four times a day (QID) | ORAL | Status: DC | PRN
  Administered 2023-12-02 – 2023-12-22 (×7): 650 mg via ORAL
  Filled 2023-11-26 (×6): qty 2

## 2023-11-26 MED ORDER — IOHEXOL 350 MG/ML SOLN
100.0000 mL | Freq: Once | INTRAVENOUS | Status: AC | PRN
  Administered 2023-11-26: 100 mL via INTRAVENOUS

## 2023-11-26 MED ORDER — ALUM & MAG HYDROXIDE-SIMETH 200-200-20 MG/5ML PO SUSP
30.0000 mL | Freq: Once | ORAL | Status: AC
  Administered 2023-11-26: 30 mL via ORAL
  Filled 2023-11-26: qty 30

## 2023-11-26 MED ORDER — CEFTRIAXONE SODIUM 2 G IJ SOLR
2.0000 g | Freq: Once | INTRAMUSCULAR | Status: AC
  Administered 2023-11-26: 2 g via INTRAVENOUS
  Filled 2023-11-26: qty 20

## 2023-11-26 MED ORDER — LACTULOSE 10 GM/15ML PO SOLN
20.0000 g | Freq: Two times a day (BID) | ORAL | Status: DC
  Administered 2023-11-27 – 2023-12-13 (×26): 20 g via ORAL
  Filled 2023-11-26 (×36): qty 30

## 2023-11-26 MED ORDER — OXYCODONE HCL 5 MG PO TABS
5.0000 mg | ORAL_TABLET | Freq: Four times a day (QID) | ORAL | Status: DC | PRN
  Administered 2023-11-28 – 2023-11-30 (×6): 5 mg via ORAL
  Filled 2023-11-26 (×6): qty 1

## 2023-11-26 MED ORDER — PANTOPRAZOLE SODIUM 40 MG IV SOLR
40.0000 mg | Freq: Two times a day (BID) | INTRAVENOUS | Status: DC
  Administered 2023-11-26 – 2023-11-28 (×3): 40 mg via INTRAVENOUS
  Filled 2023-11-26 (×4): qty 10

## 2023-11-26 MED ORDER — TAMSULOSIN HCL 0.4 MG PO CAPS
0.4000 mg | ORAL_CAPSULE | Freq: Every day | ORAL | Status: DC
  Administered 2023-11-27 – 2023-12-23 (×25): 0.4 mg via ORAL
  Filled 2023-11-26 (×26): qty 1

## 2023-11-26 NOTE — Progress Notes (Signed)
CODE SEPSIS - PHARMACY COMMUNICATION  **Broad Spectrum Antibiotics should be administered within 1 hour of Sepsis diagnosis**  Time Code Sepsis Called/Page Received: 1333  Antibiotics Ordered: ceftriaxone and azithromycin  Time of 1st antibiotic administration: 1400  Additional action taken by pharmacy: None  If necessary, Name of Provider/Nurse Contacted: None    Casey Franco ,PharmD Clinical Pharmacist  11/26/2023  2:02 PM

## 2023-11-26 NOTE — ED Provider Notes (Signed)
3:50 PM Assumed care for off going team.   Blood pressure (!) 164/108, pulse (!) 102, temperature 98.5 F (36.9 C), temperature source Oral, resp. rate 19, height 6' (1.829 m), weight 68 kg, SpO2 97%.  See their HPI for full report but in brief CT and admit     IMPRESSION: 1. No evidence of acute pulmonary embolism or other acute vascular findings in the chest. 2. Small bilateral pleural effusions with dependent opacities in both lung bases, likely atelectasis. 3. Large amount of ascites has increased in volume compared with the previous study. There is a large anterior component which now contains an air-level and multiple internal air bubbles as well as diffuse peritoneal enhancement, highly suspicious for peritonitis. Cannot exclude bowel perforation. Correlate clinically. Repeat paracentesis and general surgical consultation may be helpful for evaluation. 4. Morphologic changes of cirrhosis with probable chronic passive congestion of the liver. No focal hepatic lesions identified. 5. Chronic renal atrophy with poor parenchymal enhancement and excretion bilaterally, consistent with chronic medical renal disease. 6.  Aortic Atherosclerosis (ICD10-I70.0). 7. These results were called by telephone at the time of interpretation on 11/26/2023 at 4:41 pm to Dr Alfred Levins, who verbally acknowledged these results.  4:52 PM Pt already has been covered for SBP.   5:33 PM d/w Dr Aleen Campi  Class c -80% mortality from surgery recommends having IR try to aspirate it to see if it is bile versus peritoneal fluid and just peritonitis.   6:18 PM d/w Dr Lowella Dandy he is going to do IR guided para tonight given need to rule out /bile in the fluid.  Will admit to hospital.      Concha Se, MD 11/26/23 [phone]

## 2023-11-26 NOTE — ED Notes (Signed)
MD Fuller Plan notified of patient's difficulty swallowing when this RN gave Maalox. This RN requested a SLP consult.

## 2023-11-26 NOTE — ED Triage Notes (Signed)
See first nurse note: pt reports having chest pain this am and vomiting.

## 2023-11-26 NOTE — H&P (Signed)
History and Physical    Casey Franco NWG:[id number] DOB: 05-12-1961 DOA: 11/26/2023  Referring MD/NP/PA:   PCP: [facility]   Patient coming from:  The patient is coming from home.     Chief Complaint: Abdominal pain  HPI: Casey Franco is a 63 y.o. 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  Patient states that he has abdominal pain for almost 3 weeks, which is diffuse, constant, aching, nonradiating.  Associated with nausea and multiple episodes of vomiting.  He states that today he had 1 episode of coffee-ground emesis. No diarrhea.  His abdomen is distended.  Patient had paracentesis on 2/11.    No fever or chills.  He had some chest discomfort earlier, which has resolved.  Currently no chest pain, cough, SOB.  No symptoms of UTI.  Patient states his last dialysis was on Monday, did not do dialysis on Wednesday.  Data reviewed independently and ED Course: pt was found to have WBC 15.8, potassium 4.5, bicarbonate 18, creatinine 11.92, BUN 102, lactic acid 1.7, troponin 172 --> 138, temperature normal, blood pressure 151/99, heart rate of 104, RR 27, oxygen saturation 97% on room air.  Chest x-ray showed bilateral basilar patchy opacity.  Patient is admitted to PCU as inpatient.  Dr. Cherylann Ratel of renal, Dr. Aleen Campi of surgery, Dr. Lowella Dandy of IR are consulted.  CTA and CT-abd/pelvis:  1. No evidence of acute pulmonary embolism or other acute vascular findings in the chest. 2. Small bilateral pleural effusions with dependent opacities in both lung bases, likely atelectasis. 3. Large amount of ascites has increased in volume compared with the previous study. There is a large anterior component which now contains an air-level and multiple internal air bubbles as well as diffuse peritoneal enhancement, highly suspicious for  peritonitis. Cannot exclude bowel perforation. Correlate clinically. Repeat paracentesis and general surgical consultation may be helpful for evaluation. 4. Morphologic changes of cirrhosis with probable chronic passive congestion of the liver. No focal hepatic lesions identified. 5. Chronic renal atrophy with poor parenchymal enhancement and excretion bilaterally, consistent with chronic medical renal disease. 6.  Aortic Atherosclerosis (ICD10-I70.0). 7. These results were called by telephone at the time of interpretation on 11/26/2023 at 4:41 pm to Dr Alfred Levins, who verbally acknowledged these results.     EKG: I have personally reviewed.  Sinus rhythm, QTc 474, RAD, nonspecific T wave change.   Review of Systems:   General: no fevers, chills, no body weight gain, has poor appetite, has fatigue HEENT: no blurry vision, hearing changes or sore throat Respiratory: no dyspnea, coughing, wheezing CV: no chest pain, no palpitations GI: has nausea, vomiting, abdominal pain, no diarrhea, constipation GU: no dysuria, burning on urination, increased urinary frequency, hematuria  Ext: has trace leg edema Neuro: no unilateral weakness, numbness, or tingling, no vision change or hearing loss Skin: no rash, no skin tear. MSK: No muscle spasm, no deformity, no limitation of range of movement in spin Heme: No easy bruising.  Travel history: No recent long distant travel.   Allergy: No Known Allergies  Past Medical History:  Diagnosis Date   AKI (acute kidney injury) (HCC) 08/30/2018   Diabetes mellitus without complication (HCC)    no meds since weight loss   Dialysis patient (HCC)    Mon, Wed, Fri   Hyperkalemia 02/04/2021   Hypertension    Metabolic acidosis, increased anion gap  02/04/2021   Renal disorder     Past Surgical History:  Procedure Laterality Date   CATARACT EXTRACTION W/PHACO Right 10/08/2023   Procedure: CATARACT EXTRACTION PHACO AND INTRAOCULAR LENS PLACEMENT (IOC)  RIGHT DIABETIC 12.19 01:08.8;  Surgeon: Estanislado Pandy, MD;  Location: [facility];  Service: Ophthalmology;  Laterality: Right;   CATARACT EXTRACTION W/PHACO Left 10/22/2023   Procedure: CATARACT EXTRACTION PHACO AND INTRAOCULAR LENS PLACEMENT (IOC) LEFT DIABETIC MALYUGIN;  Surgeon: Estanislado Pandy, MD;  Location: [facility];  Service: Ophthalmology;  Laterality: Left;  7.66 0:54.2   COLONOSCOPY WITH PROPOFOL N/A 11/21/2021   Procedure: COLONOSCOPY WITH PROPOFOL;  Surgeon: Toney Reil, MD;  Location[facility];  Service: Gastroenterology;  Laterality: N/A;   ESOPHAGOGASTRODUODENOSCOPY (EGD) WITH PROPOFOL N/A 11/21/2021   Procedure: ESOPHAGOGASTRODUODENOSCOPY (EGD) WITH PROPOFOL;  Surgeon: Toney Reil, MD;  Location: [facility] ENDOSCOPY;  Service: Gastroenterology;  Laterality: N/A;   INSERTION OF DIALYSIS CATHETER     LEG SURGERY  1987    Social History:  reports that he has quit smoking. His smoking use included cigarettes. He has never used smokeless tobacco. He reports current alcohol use of about 7.0 standard drinks of alcohol per week. He reports that he does not currently use drugs after having used the following drugs: Cocaine and Marijuana.  Family History:  Family History  Problem Relation Age of Onset   Diabetes Mellitus II Sister    Diabetes Mellitus II Paternal Grandmother      Prior to Admission medications   Medication Sig Start Date End Date Taking? Authorizing Provider  albumin human 25 % bottle Inject 25 grams no matter what if more then 5L are removed then another 25 grams 11/20/23   Toney Reil, MD  amLODipine (NORVASC) 10 MG tablet Take 1 tablet by mouth daily. 05/18/20   [provider]  Ascorbic Acid (VITAMIN C PO) Take by mouth.    [provider]  blood glucose meter kit and supplies KIT Dispense based on patient and insurance preference. Use up to four times daily as directed. (FOR ICD-9 250.00,  250.01). 09/01/18   Auburn Bilberry, MD  calcium acetate (PHOSLO) 667 MG capsule Take 1,334 mg by mouth 3 (three) times a week. 02/04/21   [provider]  furosemide (LASIX) 80 MG tablet Take 80 mg by mouth daily. 01/25/21   [provider]  hydrALAZINE (APRESOLINE) 25 MG tablet Take 25 mg by mouth 3 (three) times daily. 01/25/21   [provider]  irbesartan (AVAPRO) 300 MG tablet Take 300 mg by mouth daily. 02/04/21   [provider]  Multiple Vitamin (MULTIVITAMIN) tablet Take 1 tablet by mouth daily.    [provider]  polyethylene glycol powder (GLYCOLAX/MIRALAX) 17 GM/SCOOP powder 1 cap full in a full glass of water, two times a day for 3 days. 11/12/23   Sharman Cheek, MD  senna-docusate (SENOKOT-S) 8.6-50 MG tablet Take 2 tablets by mouth 2 (two) times daily. 11/12/23   Sharman Cheek, MD  tamsulosin (FLOMAX) 0.4 MG CAPS capsule Take 0.4 mg by mouth daily. 01/30/21   [provider]  VITAMIN E PO Take by mouth.    [provider]    Physical Exam: Vitals:   11/26/23 2100 11/26/23 2125 11/26/23 2330 11/26/23 2343  BP: (!) 165/112 (!) 146/105 (!) 148/94   Pulse:   92   Resp:   19   Temp:    98 F (36.7 C)  TempSrc:    Oral  SpO2:   96%   Weight:      Height:       General: Not in acute distress HEENT:       Eyes: PERRL, EOMI, no jaundice       ENT: No discharge from the ears and nose, no pharynx injection, no tonsillar enlargement.        Neck: No JVD, no bruit, no mass felt. Heme: No neck lymph node enlargement. Cardiac: S1/S2, RRR, No murmurs, No gallops or rubs. Respiratory: No rales, wheezing, rhonchi or rubs. GI: distended, diffusely tender, BS present. GU: No hematuria Ext: has trace pitting leg edema bilaterally. 1+DP/PT pulse bilaterally. Musculoskeletal: No joint deformities, No joint redness or warmth, no limitation of ROM in spin. Skin: No rashes.  Neuro: Alert, oriented X3, cranial nerves II-XII  grossly intact, moves all extremities normally. Psych: Patient is not psychotic, no suicidal or hemocidal ideation.  Labs on Admission: I have personally reviewed following labs and imaging studies  CBC: Recent Labs  Lab 11/26/23 1122 11/26/23 2344  WBC 15.8* 15.8*  NEUTROABS 13.4*  --   HGB 11.0* 9.9*  HCT 33.1* 29.9*  MCV 92.5 92.3  PLT 308 246   Basic Metabolic Panel: Recent Labs  Lab 11/26/23 1122  NA 142  K 4.5  CL 100  CO2 18*  GLUCOSE 112*  BUN 102*  CREATININE 11.92*  CALCIUM 9.4   GFR: Estimated Creatinine Clearance: 6.1 mL/min (A) (by C-G formula based on SCr of 11.92 mg/dL (H)). Liver Function Tests: Recent Labs  Lab 11/26/23 1122  AST 30  ALT 16  ALKPHOS 96  BILITOT 2.2*  PROT 7.5  ALBUMIN 2.8*   Recent Labs  Lab 11/26/23 1122  LIPASE 35   Recent Labs  Lab 11/26/23 1921  AMMONIA 67*   Coagulation Profile: Recent Labs  Lab 11/26/23 1223  INR 1.9*   Cardiac Enzymes: No results for input(s): "CKTOTAL", "CKMB", "CKMBINDEX", "TROPONINI" in the last 168 hours. BNP (last 3 results) No results for input(s): "PROBNP" in the last 8760 hours. HbA1C: Recent Labs    11/26/23 1122  HGBA1C 5.2   CBG: No results for input(s): "GLUCAP" in the last 168 hours. Lipid Profile: No results for input(s): "CHOL", "HDL", "LDLCALC", "TRIG", "CHOLHDL", "LDLDIRECT" in the last 72 hours. Thyroid Function Tests: No results for input(s): "TSH", "T4TOTAL", "FREET4", "T3FREE", "THYROIDAB" in the last 72 hours. Anemia Panel: No results for input(s): "VITAMINB12", "FOLATE", "FERRITIN", "TIBC", "IRON", "RETICCTPCT" in the last 72 hours. Urine analysis:    Component Value Date/Time   COLORURINE YELLOW (A) 08/30/2018 1810   APPEARANCEUR CLEAR (A) 08/30/2018 1810   LABSPEC 1.012 08/30/2018 1810   PHURINE 6.0 08/30/2018 1810   GLUCOSEU 150 (A) 08/30/2018 1810   HGBUR NEGATIVE 08/30/2018 1810   BILIRUBINUR NEGATIVE 08/30/2018 1810   KETONESUR NEGATIVE  08/30/2018 1810   PROTEINUR >=300 (A) 08/30/2018 1810   NITRITE NEGATIVE 08/30/2018 1810   LEUKOCYTESUR NEGATIVE 08/30/2018 1810   Sepsis Labs: @LABRCNTIP (procalcitonin:4,lacticidven:4) ) Recent Results (from the past 240 hours)  Resp panel by RT-PCR (RSV, Flu A&B, Covid) Anterior Nasal Swab     Status: None   Collection Time: 11/19/23  9:11 AM   Specimen: Anterior Nasal Swab  Result Value Ref Range Status   SARS Coronavirus 2 by RT PCR NEGATIVE NEGATIVE Final    Comment: (NOTE) SARS-CoV-2 target nucleic acids are NOT DETECTED.  The SARS-CoV-2 RNA is generally detectable in upper respiratory specimens during the acute phase of infection. The lowest concentration of  SARS-CoV-2 viral copies this assay can detect is 138 copies/mL. A negative result does not preclude SARS-Cov-2 infection and should not be used as the sole basis for treatment or other patient management decisions. A negative result may occur with  improper specimen collection/handling, submission of specimen other than nasopharyngeal swab, presence of viral mutation(s) within the areas targeted by this assay, and inadequate number of viral copies(<138 copies/mL). A negative result must be combined with clinical observations, patient history, and epidemiological information. The expected result is Negative.  Fact Sheet for Patients:  BloggerCourse.com  Fact Sheet for Healthcare Providers:  SeriousBroker.it  This test is no t yet approved or cleared by the [place] FDA and  has been authorized for detection and/or diagnosis of SARS-CoV-2 by FDA under an Emergency Use Authorization (EUA). This EUA will remain  in effect (meaning this test can be used) for the duration of the COVID-19 declaration under Section 564(b)(1) of the Act, 21 U.S.C.section 360bbb-3(b)(1), unless the authorization is terminated  or revoked sooner.       Influenza A by PCR NEGATIVE  NEGATIVE Final   Influenza B by PCR NEGATIVE NEGATIVE Final    Comment: (NOTE) The Xpert Xpress SARS-CoV-2/FLU/RSV plus assay is intended as an aid in the diagnosis of influenza from Nasopharyngeal swab specimens and should not be used as a sole basis for treatment. Nasal washings and aspirates are unacceptable for Xpert Xpress SARS-CoV-2/FLU/RSV testing.  Fact Sheet for Patients: BloggerCourse.com  Fact Sheet for Healthcare Providers: SeriousBroker.it  This test is not yet approved or cleared by the [place] FDA and has been authorized for detection and/or diagnosis of SARS-CoV-2 by FDA under an Emergency Use Authorization (EUA). This EUA will remain in effect (meaning this test can be used) for the duration of the COVID-19 declaration under Section 564(b)(1) of the Act, 21 U.S.C. section 360bbb-3(b)(1), unless the authorization is terminated or revoked.     Resp Syncytial Virus by PCR NEGATIVE NEGATIVE Final    Comment: (NOTE) Fact Sheet for Patients: BloggerCourse.com  Fact Sheet for Healthcare Providers: SeriousBroker.it  This test is not yet approved or cleared by the [place] FDA and has been authorized for detection and/or diagnosis of SARS-CoV-2 by FDA under an Emergency Use Authorization (EUA). This EUA will remain in effect (meaning this test can be used) for the duration of the COVID-19 declaration under Section 564(b)(1) of the Act, 21 U.S.C. section 360bbb-3(b)(1), unless the authorization is terminated or revoked.  Performed at [facility], [address] [place], [place] [address]   Body fluid culture w Gram Stain     Status: None (Preliminary result)   Collection Time: 11/24/23  9:43 AM   Specimen: PATH Cytology Peritoneal fluid  Result Value Ref Range Status   Specimen Description   Final    PERITONEAL Performed at [facility], [address] [place], [place] [address]    Special Requests   Final    NONE Performed at [facility], [address] [place], [place] [address]    Gram Stain NO WBC SEEN NO ORGANISMS SEEN   Final   Culture   Final    NO GROWTH 2 DAYS Performed at [facility], [address] [place], [place] [address]    Report Status PENDING  Incomplete     Radiological Exams on Admission:   Assessment/Plan Principal Problem:   Abdominal pain Active Problems:   Cirrhosis of liver with ascites (HCC)   Myocardial injury  Essential hypertension   ESRD (end stage renal disease) (HCC)   Coffee ground emesis   Assessment and Plan:  Abdominal pain: Potential differential diagnosis is SBP.  CT scan also showed  air-level and multiple internal air bubbles, cannot exclude bowel perforation. Dr. Aleen Campi of surgery is consulted --> patient is very poor candidate for surgery. Consulted Dr. Lowella Dandy of IR who did paracentesis with 1.6 L fluid removed.  -Admitted to PCU as inpatient -IV Rocephin (patient received 1 dose of azithromycin in ED due to patchy infiltration on chest x-ray, but patient does not have fever, does not seem to have pneumonia clinically) -Follow-up of blood culture -Follow-up fluid analysis -As needed fentanyl, oxycodone for pain -Albumin 25 g was given  Cirrhosis of liver with ascites [facility]): Mental status normal. -Continue lactulose 20 g twice daily -Check ammonia level --> 67  Myocardial injury: Troponin 172 --> 138 -Will not give aspirin since patient has coffee-ground emesis -Trend troponin -Check A1c, FLP -Also not good candidate for statin due to liver cirrhosis  Essential hypertension -IV hydralazine as needed -Irbesartan, amlodipine  ESRD (end stage renal disease) (HCC) -Consulted Dr. Cherylann Ratel for nonemergent dialysis  Coffee ground emesis: Hemoglobin 11.0 which is stable.  Patient had hemoglobin 11.1. -Protonix to 40 mg twice  daily -Check CBC every 6 hours       DVT ppx: SCD  Code Status: Full code     Family Communication:     not done, no family member is at bed side.      Disposition Plan:  Anticipate discharge back to previous environment  Consults called:   Dr. Cherylann Ratel of renal, Dr. Aleen Campi of surgery, Dr. Lowella Dandy of IR are consulted.  Admission status and Level of care: Progressive:    for obs as inpt        Dispo: The patient is from: Home              Anticipated d/c is to: Home              Anticipated d/c date is: 2 days              Patient currently is not medically stable to d/c.    Severity of Illness:  The appropriate patient status for this patient is INPATIENT. Inpatient status is judged to be reasonable and necessary in order to provide the required intensity of service to ensure the patient's safety. The patient's presenting symptoms, physical exam findings, and initial radiographic and laboratory data in the context of their chronic comorbidities is felt to place them at high risk for further clinical deterioration. Furthermore, it is not anticipated that the patient will be medically stable for discharge from the hospital within 2 midnights of admission.   * I certify that at the point of admission it is my clinical judgment that the patient will require inpatient hospital care spanning beyond 2 midnights from the point of admission due to high intensity of service, high risk for further deterioration and high frequency of surveillance required.*       Date of Service 11/27/2023    Lorretta Harp [facility]   If 7PM-7AM, please contact night-coverage [email or web address] 11/27/2023, 1:15 AM

## 2023-11-26 NOTE — Sepsis Progress Note (Signed)
eLink is following this Code Sepsis.

## 2023-11-26 NOTE — Procedures (Signed)
Interventional Radiology Procedure:   Indications: Large volume of ascites with a large amount of air  Procedure: US guided paracentesis  Findings: Removed 1600 ml from the midline.  Opaque yellow fluid removed.   Complications: None     EBL: Less than 10 ml   Yona Kosek R. Lowella Dandy, MD  Pager: [phone]

## 2023-11-26 NOTE — ED Notes (Signed)
Patient cleaned up by this RN and Annabelle Harman, RN. Patient's t-shirt was soiled with emesis. This RN placed patient's t-shirt in a patient belongings bag. This RN placed a new sheet and a new paper pad under the patient. The patient was placed in a clean hospital gown. This RN provided new warm blankets to the patient. The patient was boosted in bed and placed in a more comfortable position. The patient was positioned with his bedside table in front of him and his dinner tray was provided. Patient denies other needs at this time. Bed in lowest position with call light in reach.

## 2023-11-26 NOTE — ED Notes (Signed)
Blue top was not collected but accidentally clicked off.

## 2023-11-26 NOTE — ED Notes (Signed)
First Nurse Note: Pt to ED via [facility] from home for chest pain. Pt reports substernal cp that is burning. Pt seen on 2/11 and had paracentesis. Pt is vomiting coffee ground emesis. Pts abdomen is distended. Pt having shortness of breath. Pt is a dialysis pt, has not had dialysis since Monday.  Pt has 18 G IV in the left hand.  HR 103 Eco2- 25 RR- 28 Temp- 98.2

## 2023-11-26 NOTE — ED Provider Notes (Signed)
Casey Franco Provider Note    Event Date/Time   First MD Initiated Contact with Patient 11/26/23 1205     (approximate)   History   Chief Complaint Chest Pain and Emesis   HPI  Casey Franco is a 63 y.o. male with past medical history of hypertension, diabetes, ESRD on HD (MWF), hepatitis C, and anemia who presents to the ED complaining of abdominal pain.  Patient reports that he has been dealing with discomfort in his upper abdomen for almost 2 weeks now.  He states that he has very poor appetite due to this and symptoms have been progressively worsening.  Last night, he began to feel increasingly nauseous and has had multiple episodes of vomiting, unable to keep anything down in the past 24 hours.  He denies any associated diarrhea, does state that emesis appears similar to "coffee grounds."  He does not take any blood thinners, denies any history of GI bleeding.  He does state that his abdomen has been distended, had paracentesis performed 2 days ago with partial relief, but has been worsening since then.  He states that the pain has begun to move up into his chest since he has begun vomiting, denies any difficulty breathing.  He last had dialysis 3 days ago on Monday, missed his appointment yesterday due to feeling ill.     Physical Exam   Triage Vital Signs: ED Triage Vitals  Encounter Vitals Group     BP 11/26/23 1113 (!) 169/115     Systolic BP Percentile --      Diastolic BP Percentile --      Pulse Rate 11/26/23 1113 (!) 101     Resp 11/26/23 1113 (!) 27     Temp 11/26/23 1113 98 F (36.7 C)     Temp src --      SpO2 11/26/23 1113 96 %     Weight 11/26/23 1114 149 lb 14.6 oz (68 kg)     Height 11/26/23 1114 6' (1.829 m)     Head Circumference --      Peak Flow --      Pain Score 11/26/23 1113 8     Pain Loc --      Pain Education --      Exclude from Growth Chart --     Most recent vital signs: Vitals:   11/26/23 1113  BP: (!) 169/115   Pulse: (!) 101  Resp: (!) 27  Temp: 98 F (36.7 C)  SpO2: 96%    Constitutional: Alert and oriented. Eyes: Conjunctivae are normal. Head: Atraumatic. Nose: No congestion/rhinnorhea. Mouth/Throat: Mucous membranes are moist.  Cardiovascular: Tachycardic, regular rhythm. Grossly normal heart sounds.  2+ radial pulses bilaterally.  Right chest wall TDC intact with no erythema, edema, or tenderness. Respiratory: Normal respiratory effort.  No retractions. Lungs CTAB.  No chest wall tenderness to palpation. Gastrointestinal: Soft with diffuse distention and tenderness. Musculoskeletal: No lower extremity tenderness nor edema.  Neurologic:  Normal speech and language. No gross focal neurologic deficits are appreciated.    ED Results / Procedures / Treatments   Labs (all labs ordered are listed, but only abnormal results are displayed) Labs Reviewed  COMPREHENSIVE METABOLIC PANEL - Abnormal; Notable for the following components:      Result Value   CO2 18 (*)    Glucose, Bld 112 (*)    BUN 102 (*)    Creatinine, Ser 11.92 (*)    Albumin 2.8 (*)  Total Bilirubin 2.2 (*)    GFR, Estimated 4 (*)    Anion gap 24 (*)    All other components within normal limits  CBC WITH DIFFERENTIAL/PLATELET - Abnormal; Notable for the following components:   WBC 15.8 (*)    RBC 3.58 (*)    Hemoglobin 11.0 (*)    HCT 33.1 (*)    Neutro Abs 13.4 (*)    Lymphs Abs 0.4 (*)    Monocytes Absolute 1.5 (*)    Abs Immature Granulocytes 0.22 (*)    All other components within normal limits  PROTIME-INR - Abnormal; Notable for the following components:   Prothrombin Time 22.4 (*)    INR 1.9 (*)    All other components within normal limits  TROPONIN I (HIGH SENSITIVITY) - Abnormal; Notable for the following components:   Troponin I (High Sensitivity) 172 (*)    All other components within normal limits  CULTURE, BLOOD (ROUTINE X 2)  CULTURE, BLOOD (ROUTINE X 2)  LACTIC ACID, PLASMA  LIPASE,  BLOOD  TROPONIN I (HIGH SENSITIVITY)     EKG  ED ECG REPORT I, Chesley Noon, the attending physician, personally viewed and interpreted this ECG.   Date: 11/26/2023  EKG Time: 11:14  Rate: 101  Rhythm: sinus tachycardia  Axis: RAD  Intervals:none  ST&T Change: None  RADIOLOGY Chest x-ray reviewed and interpreted by me with multifocal infiltrates concerning for edema versus infection.  PROCEDURES:  Critical Care performed: No  Procedures   MEDICATIONS ORDERED IN ED: Medications  azithromycin (ZITHROMAX) 500 mg in sodium chloride 0.9 % 250 mL IVPB (has no administration in time range)  morphine (PF) 4 MG/ML injection 4 mg (4 mg Intravenous Given 11/26/23 1301)  ondansetron (ZOFRAN) injection 4 mg (4 mg Intravenous Given 11/26/23 1300)  pantoprazole (PROTONIX) injection 40 mg (40 mg Intravenous Given 11/26/23 1258)  cefTRIAXone (ROCEPHIN) 2 g in sodium chloride 0.9 % 100 mL IVPB (0 g Intravenous Stopped 11/26/23 1432)  iohexol (OMNIPAQUE) 350 MG/ML injection 100 mL (100 mLs Intravenous Contrast Given 11/26/23 1436)     IMPRESSION / MDM / ASSESSMENT AND PLAN / ED COURSE  I reviewed the triage vital signs and the nursing notes.                              63 y.o. male with past medical history of hypertension, diabetes, ESRD on HD (MWF), chronic hepatitis C, and anemia who presents to the ED complaining of increasing abdominal pain and distention with coffee-ground emesis developing overnight.  Patient's presentation is most consistent with acute presentation with potential threat to life or bodily function.  Differential diagnosis includes, but is not limited to, bowel obstruction, pancreatitis, hepatitis, cholecystitis, biliary colic, gastritis, PUD, upper GI bleed, ACS, PE, pneumonia, pneumothorax, SBP.  Patient nontoxic-appearing and in no acute distress, vital signs remarkable for tachycardia, tachypnea, and hypertension.  His abdomen is soft but diffusely distended  with fluid and tender to palpation.  I reviewed his CT imaging from prior ED visit 1 week ago, which was unremarkable.  With worsening symptoms, plan to repeat imaging with IV contrast given patient has been on dialysis for multiple years.  EKG shows no evidence of arrhythmia or ischemia, chest pain potentially due to emesis and esophagitis.  We will treat with IV morphine and Zofran, also give dose of IV Protonix.  He has some dark emesis on his shirt but no obvious bleeding noted, hemoglobin stable  on initial labs and will continue to observe for hematemesis.  Patient does have leukocytosis and sepsis workup initiated, but no obvious source of infection at this time.  Patient does not make urine.  Chest x-ray with multifocal infiltrates concerning for edema versus infection, will cover for pneumonia with IV antibiotics given patient meeting sepsis criteria.  Plan to further assess with CTA of his chest as well as CT of his abdomen/pelvis.  Patient turned over to oncoming provider pending CT results and admission.      FINAL CLINICAL IMPRESSION(S) / ED DIAGNOSES   Final diagnoses:  Sepsis without acute organ dysfunction, due to unspecified organism (HCC)  Pneumonia of both lungs due to infectious organism, unspecified part of lung  Intractable vomiting     Rx / DC Orders   ED Discharge Orders     None        Note:  This document was prepared using Dragon voice recognition software and may include unintentional dictation errors.   Chesley Noon, MD 11/26/23 [phone]

## 2023-11-26 NOTE — ED Notes (Signed)
Patient transported to X-ray

## 2023-11-26 NOTE — Progress Notes (Signed)
11/26/23  Discussed patient with Dr. Fuller Plan.  Patient has history of advanced cirrhosis and has significant ascites.  He has CT scan from 11/19/23 which shows a significant large volume ascites.  He underwent paracentesis on 11/24/23 which drained 350 mL, though based on his CT scan, he had multiple liters of fluid in his abdomen.  He presented to the ED with upper abdominal / chest pain associated with nausea/vomiting.  He had a repeat CT scan of abdomen/pelvis which showed again a large volume collection is his abdomen, though now with an anterior component of air and multiple air locules throughout the fluid collection.  On my personal view, aside from the air related to this fluid, there is no other free air -- i.e. no air by the liver, through the mesentery, or evidence of pneumatosis.  This could represent infected ascites, although he could also have had a perforation during paracentesis.    Unfortunately, he is a very poor surgical candidate.  He has multiple comorbidities also including ESRD on dialysis, DM, and HTN.  He has class C cirrhosis based on his labs today with total bilirubin of 2.2, albumin of 2.8, INR 1.9, and large volume ascites.  His mortality risk is very high if we do any abdominal surgery.  Would recommend first aspiration of this fluid to see if it's infected ascites or if there's a bile or feculent component.  This would then be able to guide better whether he truly has a bowel perforation or not.  However, even if he did have a perforation, he would be a high mortality risk, high risk of hernias, fascial dehiscence due to large volume ascites, etc.    For now, recommend aspiration/drainage of the fluid, admission to hospitalist team, palliative care consult for discussion of goals of care.  Henrene Dodge, MD

## 2023-11-27 DIAGNOSIS — R1110 Vomiting, unspecified: Secondary | ICD-10-CM

## 2023-11-27 DIAGNOSIS — J189 Pneumonia, unspecified organism: Secondary | ICD-10-CM

## 2023-11-27 DIAGNOSIS — K652 Spontaneous bacterial peritonitis: Secondary | ICD-10-CM | POA: Diagnosis present

## 2023-11-27 DIAGNOSIS — Z515 Encounter for palliative care: Secondary | ICD-10-CM

## 2023-11-27 DIAGNOSIS — A419 Sepsis, unspecified organism: Secondary | ICD-10-CM | POA: Diagnosis not present

## 2023-11-27 DIAGNOSIS — K7460 Unspecified cirrhosis of liver: Secondary | ICD-10-CM | POA: Diagnosis not present

## 2023-11-27 DIAGNOSIS — R188 Other ascites: Secondary | ICD-10-CM | POA: Diagnosis not present

## 2023-11-27 DIAGNOSIS — N186 End stage renal disease: Secondary | ICD-10-CM | POA: Diagnosis not present

## 2023-11-27 LAB — CBC
HCT: 28.4 % — ABNORMAL LOW (ref 39.0–52.0)
HCT: 28.6 % — ABNORMAL LOW (ref 39.0–52.0)
HCT: 29.9 % — ABNORMAL LOW (ref 39.0–52.0)
Hemoglobin: 9.4 g/dL — ABNORMAL LOW (ref 13.0–17.0)
Hemoglobin: 9.7 g/dL — ABNORMAL LOW (ref 13.0–17.0)
Hemoglobin: 9.9 g/dL — ABNORMAL LOW (ref 13.0–17.0)
MCH: 29.8 pg (ref 26.0–34.0)
MCH: 30.6 pg (ref 26.0–34.0)
MCH: 30.8 pg (ref 26.0–34.0)
MCHC: 33.1 g/dL (ref 30.0–36.0)
MCHC: 33.1 g/dL (ref 30.0–36.0)
MCHC: 33.9 g/dL (ref 30.0–36.0)
MCV: 90.2 fL (ref 80.0–100.0)
MCV: 90.8 fL (ref 80.0–100.0)
MCV: 92.3 fL (ref 80.0–100.0)
Platelets: 241 10*3/uL (ref 150–400)
Platelets: 246 10*3/uL (ref 150–400)
Platelets: 248 10*3/uL (ref 150–400)
RBC: 3.15 MIL/uL — ABNORMAL LOW (ref 4.22–5.81)
RBC: 3.15 MIL/uL — ABNORMAL LOW (ref 4.22–5.81)
RBC: 3.24 MIL/uL — ABNORMAL LOW (ref 4.22–5.81)
RDW: 14.9 % (ref 11.5–15.5)
RDW: 14.9 % (ref 11.5–15.5)
RDW: 15.1 % (ref 11.5–15.5)
WBC: 14 10*3/uL — ABNORMAL HIGH (ref 4.0–10.5)
WBC: 14.7 10*3/uL — ABNORMAL HIGH (ref 4.0–10.5)
WBC: 15.8 10*3/uL — ABNORMAL HIGH (ref 4.0–10.5)
nRBC: 0 % (ref 0.0–0.2)
nRBC: 0 % (ref 0.0–0.2)
nRBC: 0 % (ref 0.0–0.2)

## 2023-11-27 LAB — CBG MONITORING, ED: Glucose-Capillary: 79 mg/dL (ref 70–99)

## 2023-11-27 LAB — LACTATE DEHYDROGENASE, PLEURAL OR PERITONEAL FLUID: LD, Fluid: >4793 U/L — ABNORMAL HIGH (ref 3–23)

## 2023-11-27 LAB — BLOOD CULTURE ID PANEL (REFLEXED) - BCID2

## 2023-11-27 LAB — BODY FLUID CULTURE W GRAM STAIN: Gram Stain: NONE SEEN

## 2023-11-27 LAB — LIPID PANEL
Cholesterol: 67 mg/dL (ref 0–200)
HDL: <10 mg/dL — ABNORMAL LOW (ref >40)
Triglycerides: 104 mg/dL (ref <150)
VLDL: 21 mg/dL (ref 0–40)

## 2023-11-27 LAB — TROPONIN I (HIGH SENSITIVITY)
Troponin I (High Sensitivity): 127 ng/L (ref <18)
Troponin I (High Sensitivity): 128 ng/L (ref <18)

## 2023-11-27 LAB — COMPREHENSIVE METABOLIC PANEL
ALT: 14 U/L (ref 0–44)
AST: 24 U/L (ref 15–41)
Albumin: 2.4 g/dL — ABNORMAL LOW (ref 3.5–5.0)
Alkaline Phosphatase: 70 U/L (ref 38–126)
Anion gap: 22 — ABNORMAL HIGH (ref 5–15)
BUN: 116 mg/dL — ABNORMAL HIGH (ref 8–23)
CO2: 17 mmol/L — ABNORMAL LOW (ref 22–32)
Calcium: 8.7 mg/dL — ABNORMAL LOW (ref 8.9–10.3)
Chloride: 102 mmol/L (ref 98–111)
Creatinine, Ser: 12.16 mg/dL — ABNORMAL HIGH (ref 0.61–1.24)
GFR, Estimated: 4 mL/min — ABNORMAL LOW (ref >60)
Glucose, Bld: 88 mg/dL (ref 70–99)
Potassium: 5 mmol/L (ref 3.5–5.1)
Sodium: 141 mmol/L (ref 135–145)
Total Bilirubin: 1.8 mg/dL — ABNORMAL HIGH (ref 0.0–1.2)
Total Protein: 6.7 g/dL (ref 6.5–8.1)

## 2023-11-27 LAB — HIV ANTIBODY (ROUTINE TESTING W REFLEX): HIV Screen 4th Generation wRfx: NONREACTIVE

## 2023-11-27 LAB — TYPE AND SCREEN
ABO/RH(D): O POS
Antibody Screen: NEGATIVE

## 2023-11-27 LAB — APTT: aPTT: 30 s (ref 24–36)

## 2023-11-27 MED ORDER — HEPARIN SODIUM (PORCINE) 1000 UNIT/ML DIALYSIS
1000.0000 [IU] | INTRAMUSCULAR | Status: DC | PRN
  Filled 2023-11-27: qty 1

## 2023-11-27 MED ORDER — VANCOMYCIN HCL 1500 MG/300ML IV SOLN
1500.0000 mg | Freq: Once | INTRAVENOUS | Status: DC
  Filled 2023-11-27: qty 300

## 2023-11-27 MED ORDER — ALTEPLASE 2 MG IJ SOLR: 2.0000 mg | Freq: Once | INTRAMUSCULAR | Status: DC | PRN

## 2023-11-27 MED ORDER — CHLORHEXIDINE GLUCONATE CLOTH 2 % EX PADS
6.0000 | MEDICATED_PAD | Freq: Every day | CUTANEOUS | Status: DC
  Administered 2023-11-28 – 2023-12-13 (×14): 6 via TOPICAL
  Filled 2023-11-27: qty 6

## 2023-11-27 NOTE — Progress Notes (Signed)
Hemodialysis note  Received patient in bed to unit. Alert and oriented.  Informed consent signed and in chart.  Treatment initiated: 1254 Treatment completed: 1630 Tx duration: 3.5 hours  Patient tolerated well. Transported back to room, alert without acute distress.  Report given to patient's RN.   Access used: Right Chest HD Catheter Access issues: none  Total UF removed: 0 Medication(s) given:  none  Post HD weight: 65.2 kg   Wolfgang Phoenix [facility]

## 2023-11-27 NOTE — ED Notes (Signed)
Dr Myriam Forehand notified of +culture for staph

## 2023-11-27 NOTE — Consult Note (Signed)
 Consultation Note Date: 11/27/2023 at 1315  Patient Name: Casey Franco  DOB: Feb 11, 1961  MRN: [id number]  Age / Sex: 63 y.o., male  PCP: [facility] Referring Physician: Lurene Shadow, MD  HPI/Patient Profile: 63 y.o. male  with past medical history of liver cirrhosis, ESRD (HD-MWF), HTN, diabetes, BPH admitted on 11/26/2023 with nausea with vomiting and increased abdominal distention for approximately 3 weeks.  Patient has been diagnosed with SBP after paracentesis.  PMT was consulted to support patient with goals of care discussions.  Clinical Assessment and Goals of Care: Extensive chart review completed prior to meeting patient including labs, vital signs, imaging, progress notes, orders, and available advanced directive documents from current and previous encounters. I then met with patient in HD suite to discuss diagnosis prognosis, GOC, EOL wishes, disposition and options.  I introduced Palliative Medicine as specialized medical care for people living with serious illness. It focuses on providing relief from the symptoms and stress of a serious illness. The goal is to improve quality of life for both the patient and the family.  We discussed a brief life review of the patient.  Patient was never married, has 1 son, and has 2 siblings that live nearby.  He shares he is not in contact with 1 and that he is very close with the other-Casey Franco  He states that in the event that he is unable to speak for himself he would want his Sister Casey Franco to be his next of kin decision maker.  I shared that as per [place] law, patient's next of kin decision maker would be his son and less there is an HCPOA/documentation created.  He shares she is very interested in making sure that his sister would be his next of kin decision maker.  Discussed need for spiritual care consult.  Patient was in agreement.  Spiritual  care consult placed.  I attempted to elicit goals important to the patient during this hospitalization and in the bigger picture setting.  He shares that he has no goals.  He wants to take life 1 minute at a time.  He shares that he knows he needs to make some lifestyle modifications but declines to elaborate on these.  However, during our discussion, he shares that he does "sip on wine every night".  Discussed importance of alcohol abstinence given liver cirrhosis and ESRD.  He shares that he believes he can make this adjustment if it is going to make him feel better.  We discussed patient's current illness SBP-and what it means in the larger context of patient's on-going co-morbidities-liver cirrhosis and ESRD.  Shared concern that patient has 2 major organs that are under seizure at the moment.  He shares that he knows his liver will not fully recover and that his kidneys will never function again but that he is hopeful that he can continue to feel well.  Patient was pleasant and polite, he was resistant to discussing goals or boundaries of care at this time.  He was in agreement  for PMT to continue to follow.  Ongoing discussions to continue at a later date/time.  Full code and full scope remain.   PMT will continue to follow and support patient throughout his hospitalization.  Primary Decision Maker PATIENT  Physical Exam Vitals reviewed.  Constitutional:      Comments: Thin, frail, temporal wasting  Eyes:     Pupils: Pupils are equal, round, and reactive to light.  Cardiovascular:     Rate and Rhythm: Normal rate.  Pulmonary:     Effort: Pulmonary effort is normal.  Musculoskeletal:     Comments: Generalized weakness  Skin:    General: Skin is warm and dry.  Neurological:     Mental Status: He is alert.     Palliative Assessment/Data: 60%     Thank you for this consult. Palliative medicine will continue to follow and assist holistically.   Time Total: 75 minutes  Time  spent includes: Detailed review of medical records (labs, imaging, vital signs), medically appropriate exam (mental status, respiratory, cardiac, skin), discussed with treatment team, counseling and educating patient, family and staff, documenting clinical information, medication management and coordination of care.  Signed by: Georgiann Cocker, DNP, FNP-BC Palliative Medicine   Please contact Palliative Medicine Team providers via [facility] for questions and concerns.

## 2023-11-27 NOTE — Progress Notes (Signed)
CODE SEPSIS - PHARMACY COMMUNICATION  **Broad Spectrum Antibiotics should be administered within 1 hour of Sepsis diagnosis**  Time Code Sepsis Called/Page Received: 1122  Antibiotics Ordered: ceftriaxone and azithromycin  Time of 1st antibiotic administration: antibiotics started 2/13 @1532   Additional action taken by pharmacy: None  If necessary, Name of Provider/Nurse Contacted: None    Casey Franco ,PharmD Clinical Pharmacist  11/27/2023  12:24 PM

## 2023-11-27 NOTE — Consult Note (Signed)
[facility] SURGICAL CONSULTATION NOTE (initial) - cpt: 56213   HISTORY OF PRESENT ILLNESS (HPI):  63 y.o. male presented to [facility] ED yesterday for evaluation of abdominal pain. Patient reports around a 2 week history of upper abdominal pain. This has been progressively worsening. This was accompanied with nausea and emesis. He has a history of Hep C and ascites. He did have paracentesis on 02/11. No fever, chills. Work up in the ED revealed leukocytosis to 15.8K, Hgb to 11.0, sCr - 11.92 (ESRD), albumin 2.2, INR 1.9. He did have repeat CT Abdomen/Pelvis which was concerning for large volume ascites now with air present in this. He was admitted to the medicine service. Ir was consulted and he underwent repeat paracentesis which showed yellow fluid consistent with ascites. Cx from this have grown GPC in chains, GNR, and GPR. He is on Rocephin. Regular diet.   Surgery is consulted by emergency medicine physician Dr. Artis Delay, MD in this context for evaluation and management of possible bowel injury in the setting of ascites with air fluid level.  PAST MEDICAL HISTORY (PMH):  Past Medical History:  Diagnosis Date   AKI (acute kidney injury) (HCC) 08/30/2018   Diabetes mellitus without complication (HCC)    no meds since weight loss   Dialysis patient (HCC)    Mon, Wed, Fri   Hyperkalemia 02/04/2021   Hypertension    Metabolic acidosis, increased anion gap 02/04/2021   Renal disorder      PAST SURGICAL HISTORY [facility]):  Past Surgical History:  Procedure Laterality Date   CATARACT EXTRACTION W/PHACO Right 10/08/2023   Procedure: CATARACT EXTRACTION PHACO AND INTRAOCULAR LENS PLACEMENT (IOC) RIGHT DIABETIC 12.19 01:08.8;  Surgeon: Estanislado Pandy, MD;  Location: [facility];  Service: Ophthalmology;  Laterality: Right;   CATARACT EXTRACTION W/PHACO Left 10/22/2023   Procedure: CATARACT EXTRACTION PHACO AND INTRAOCULAR LENS PLACEMENT (IOC) LEFT DIABETIC MALYUGIN;   Surgeon: Estanislado Pandy, MD;  Location: [facility];  Service: Ophthalmology;  Laterality: Left;  7.66 0:54.2   COLONOSCOPY WITH PROPOFOL N/A 11/21/2021   Procedure: COLONOSCOPY WITH PROPOFOL;  Surgeon: Toney Reil, MD;  Location: [facility] ENDOSCOPY;  Service: Gastroenterology;  Laterality: N/A;   ESOPHAGOGASTRODUODENOSCOPY (EGD) WITH PROPOFOL N/A 11/21/2021   Procedure: ESOPHAGOGASTRODUODENOSCOPY (EGD) WITH PROPOFOL;  Surgeon: Toney Reil, MD;  Location: [facility] ENDOSCOPY;  Service: Gastroenterology;  Laterality: N/A;   INSERTION OF DIALYSIS CATHETER     LEG SURGERY  1987     MEDICATIONS:  Prior to Admission medications   Medication Sig Start Date End Date Taking? Authorizing Provider  amLODipine (NORVASC) 10 MG tablet Take 1 tablet by mouth daily. 05/18/20  Yes [provider]  calcium acetate (PHOSLO) 667 MG capsule Take 1,334 mg by mouth 3 (three) times a week. 02/04/21  Yes [provider]  furosemide (LASIX) 80 MG tablet Take 80 mg by mouth daily. 01/25/21  Yes [provider]  hydrALAZINE (APRESOLINE) 25 MG tablet Take 25 mg by mouth 3 (three) times daily. 01/25/21  Yes [provider]  irbesartan (AVAPRO) 300 MG tablet Take 300 mg by mouth daily. 02/04/21  Yes [provider]  lactulose (CHRONULAC) 10 GM/15ML solution Take 20 g by mouth 2 (two) times daily. 11/20/23  Yes [provider]  Multiple Vitamin (MULTIVITAMIN) tablet Take 1 tablet by mouth daily.   Yes [provider]  senna-docusate (SENOKOT-S) 8.6-50 MG tablet Take 2 tablets by mouth 2 (two) times daily. 11/12/23  Yes Sharman Cheek, MD  tamsulosin (FLOMAX) 0.4 MG CAPS capsule Take 0.4 mg by mouth daily. 01/30/21  Yes [provider]  albumin human 25 % bottle Inject 25 grams no matter what if more then 5L are removed then another 25 grams 11/20/23   Toney Reil, MD  Ascorbic Acid (VITAMIN C PO) Take by mouth. Patient not taking:  Reported on 11/26/2023    [provider]  blood glucose meter kit and supplies KIT Dispense based on patient and insurance preference. Use up to four times daily as directed. (FOR ICD-9 250.00, 250.01). 09/01/18   Auburn Bilberry, MD  polyethylene glycol powder (GLYCOLAX/MIRALAX) 17 GM/SCOOP powder 1 cap full in a full glass of water, two times a day for 3 days. Patient not taking: Reported on 11/26/2023 11/12/23   Sharman Cheek, MD  VITAMIN E PO Take by mouth.    [provider]     ALLERGIES:  No Known Allergies   SOCIAL HISTORY:  Social History   Socioeconomic History   Marital status: Single    Spouse name: Not on file   Number of children: Not on file   Years of education: Not on file   Highest education level: Not on file  Occupational History   Not on file  Tobacco Use   Smoking status: Former    Types: Cigarettes   Smokeless tobacco: Never  Vaping Use   Vaping status: Never Used  Substance and Sexual Activity   Alcohol use: Yes    Alcohol/week: 7.0 standard drinks of alcohol    Types: 7 Cans of beer per week   Drug use: Not Currently    Types: Cocaine, Marijuana    Comment: 09/24/23 - Marijuana 3x/week. No cocaine since Friday 08/27/2018   Sexual activity: Not on file  Other Topics Concern   Not on file  Social History Narrative   Not on file   Social Drivers of Health   Financial Resource Strain: Low Risk  (05/15/2020)   Received from [facility], [facility]   Overall Financial Resource Strain (CARDIA)    Difficulty of Paying Living Expenses: Not very hard  Food Insecurity: No Food Insecurity (05/15/2020)   Received from [facility], [facility]   Hunger Vital Sign    Worried About Running Out of Food in the Last Year: Never true    Ran Out of Food in the Last Year: Never true  Transportation Needs: No Transportation Needs (05/15/2020)   Received from [facility], [facility]   [facility]    Lack of  Transportation (Medical): No    Lack of Transportation (Non-Medical): No  Physical Activity: Not on file  Stress: Not on file  Social Connections: Not on file  Intimate Partner Violence: Not on file     FAMILY HISTORY:  Family History  Problem Relation Age of Onset   Diabetes Mellitus II Sister    Diabetes Mellitus II Paternal Grandmother       REVIEW OF SYSTEMS:  Review of Systems  Constitutional:  Negative for chills and fever.  Respiratory:  Negative for cough and shortness of breath.   Cardiovascular:  Negative for chest pain and palpitations.  Gastrointestinal:  Positive for abdominal pain, nausea and vomiting. Negative for constipation and diarrhea.  All other systems reviewed and are negative.   VITAL SIGNS:  Temp:  [98 F (36.7 C)-98.5 F (36.9 C)] 98.4 F (36.9 C) (02/14 0425) Pulse Rate:  [92-104] 92 (02/14 0630) Resp:  [  18-27] 21 (02/14 0630) BP: (139-169)/(94-115) 149/98 (02/14 0630) SpO2:  [93 %-97 %] 94 % (02/14 0630) Weight:  [68 kg] 68 kg (02/13 1114)     Height: 6' (182.9 cm) Weight: 68 kg BMI (Calculated): 20.33   INTAKE/OUTPUT:  02/13 0701 - 02/14 0700 In: 350 [IV Piggyback:350] Out: -   PHYSICAL EXAM:  Physical Exam Vitals and nursing note reviewed.  Constitutional:      General: He is not in acute distress.    Appearance: Normal appearance. He is normal weight. He is not ill-appearing.  HENT:     Head: Normocephalic and atraumatic.  Eyes:     General: No scleral icterus.    Conjunctiva/sclera: Conjunctivae normal.  Cardiovascular:     Rate and Rhythm: Normal rate.     Pulses: Normal pulses.     Heart sounds: No murmur heard. Pulmonary:     Effort: Pulmonary effort is normal. No respiratory distress.  Abdominal:     General: Abdomen is protuberant. There is distension.     Palpations: Abdomen is soft.     Tenderness: There is generalized abdominal tenderness. There is no guarding or rebound.     Comments: Abdomen is soft, he is  somewhat generalized tenderness, mild, he is distended, no rebound/guarding. He is certainly not peritonitic   Genitourinary:    Comments: Deferred Skin:    General: Skin is warm and dry.  Neurological:     General: No focal deficit present.     Mental Status: He is alert and oriented to person, place, and time.  Psychiatric:        Mood and Affect: Mood normal.        Behavior: Behavior normal.      Labs:     Latest Ref Rng & Units 11/27/2023    4:21 AM 11/26/2023   11:44 PM 11/26/2023   11:22 AM  CBC  WBC 4.0 - 10.5 K/uL 14.7  15.8  15.8   Hemoglobin 13.0 - 17.0 g/dL 9.7  9.9  16.1   Hematocrit 39.0 - 52.0 % 28.6  29.9  33.1   Platelets 150 - 400 K/uL 241  246  308       Latest Ref Rng & Units 11/27/2023    4:21 AM 11/26/2023   11:22 AM 11/19/2023    8:52 AM  CMP  Glucose 70 - 99 mg/dL 88  096  83   BUN 8 - 23 mg/dL 045  409  44   Creatinine 0.61 - 1.24 mg/dL 81.19  14.78  2.95   Sodium 135 - 145 mmol/L 141  142  138   Potassium 3.5 - 5.1 mmol/L 5.0  4.5  4.0   Chloride 98 - 111 mmol/L 102  100  96   CO2 22 - 32 mmol/L 17  18  18    Calcium 8.9 - 10.3 mg/dL 8.7  9.4  8.9   Total Protein 6.5 - 8.1 g/dL 6.7  7.5  7.8   Total Bilirubin 0.0 - 1.2 mg/dL 1.8  2.2  1.8   Alkaline Phos 38 - 126 U/L 70  96  111   AST 15 - 41 U/L 24  30  29    ALT 0 - 44 U/L 14  16  16       Imaging studies:   CT Abdomen/Pelvis (11/26/2023) personally reviewed with large anterior fluid collection with air present, and radiologist report reviewed below:  IMPRESSION: 1. No evidence of acute pulmonary embolism or other acute vascular  findings in the chest. 2. Small bilateral pleural effusions with dependent opacities in both lung bases, likely atelectasis. 3. Large amount of ascites has increased in volume compared with the previous study. There is a large anterior component which now contains an air-level and multiple internal air bubbles as well as diffuse peritoneal enhancement, highly  suspicious for peritonitis. Cannot exclude bowel perforation. Correlate clinically. Repeat paracentesis and general surgical consultation may be helpful for evaluation. 4. Morphologic changes of cirrhosis with probable chronic passive congestion of the liver. No focal hepatic lesions identified. 5. Chronic renal atrophy with poor parenchymal enhancement and excretion bilaterally, consistent with chronic medical renal disease. 6.  Aortic Atherosclerosis (ICD10-I70.0).   Assessment/Plan:  63 y.o. male with likely infected ascites without evidence of bowel injury, complicated by pertinent comorbidities including cirrhosis and ESRD.   - Fortunately, repeat paracentesis reveal yellow fluid consistent with ascites. This did grow bacteria and may be infected. There was no evidence of enteric nor feculent contents, so bowel injury is less likely - No need for surgical intervention at this time. He is at extremely high risk as he is Childs-C which carries 82% perioperative mortality rate - Continue IV Abx; follow up Cx - Okay for diet as tolerated    - Monitor abdominal examination; on-going bowel function  - Pain control prn; antiemetics prn - Further management per primary service    All of the above findings and recommendations were discussed with the patient, and all of patient's questions were answered to his expressed satisfaction.  Thank you for the opportunity to participate in this patient's care.   -- Lynden Oxford, PA-C [facility] 11/27/2023, 7:05 AM M-F: 7am - 4pm

## 2023-11-27 NOTE — Evaluation (Signed)
Clinical/Bedside Swallow Evaluation Patient Details  Name: Casey Franco MRN: [id number] Date of Birth: 08-04-1961  Today's Date: 11/27/2023 Time: SLP Start Time (ACUTE ONLY): 1048 SLP Stop Time (ACUTE ONLY): 1102 SLP Time Calculation (min) (ACUTE ONLY): 14 min  Past Medical History:  Past Medical History:  Diagnosis Date   AKI (acute kidney injury) (HCC) 08/30/2018   Diabetes mellitus without complication (HCC)    no meds since weight loss   Dialysis patient (HCC)    Mon, Wed, Fri   Hyperkalemia 02/04/2021   Hypertension    Metabolic acidosis, increased anion gap 02/04/2021   Renal disorder    Past Surgical History:  Past Surgical History:  Procedure Laterality Date   CATARACT EXTRACTION W/PHACO Right 10/08/2023   Procedure: CATARACT EXTRACTION PHACO AND INTRAOCULAR LENS PLACEMENT (IOC) RIGHT DIABETIC 12.19 01:08.8;  Surgeon: Estanislado Pandy, MD;  Location: [facility];  Service: Ophthalmology;  Laterality: Right;   CATARACT EXTRACTION W/PHACO Left 10/22/2023   Procedure: CATARACT EXTRACTION PHACO AND INTRAOCULAR LENS PLACEMENT (IOC) LEFT DIABETIC MALYUGIN;  Surgeon: Estanislado Pandy, MD;  Location: [facility];  Service: Ophthalmology;  Laterality: Left;  7.66 0:54.2   COLONOSCOPY WITH PROPOFOL N/A 11/21/2021   Procedure: COLONOSCOPY WITH PROPOFOL;  Surgeon: Toney Reil, MD;  Location: [facility] ENDOSCOPY;  Service: Gastroenterology;  Laterality: N/A;   ESOPHAGOGASTRODUODENOSCOPY (EGD) WITH PROPOFOL N/A 11/21/2021   Procedure: ESOPHAGOGASTRODUODENOSCOPY (EGD) WITH PROPOFOL;  Surgeon: Toney Reil, MD;  Location: [facility] ENDOSCOPY;  Service: Gastroenterology;  Laterality: N/A;   INSERTION OF DIALYSIS CATHETER     LEG SURGERY  1987   HPI:  DECARLOS EMPEY is a 63 y.o. male with past medical history of hypertension, diabetes, ESRD on HD (MWF), hepatitis C, and anemia who presents to the ED complaining of abdominal pain.  Patient reports that he has  been dealing with discomfort in his upper abdomen for almost 2 weeks now.  He states that he has very poor appetite due to this and symptoms have been progressively worsening.  Last night, he began to feel increasingly nauseous and has had multiple episodes of vomiting, unable to keep anything down in the past 24 hours.     IMPRESSION:   1. No evidence of acute pulmonary embolism or other acute vascular   findings in the chest.   2. Small bilateral pleural effusions with dependent opacities in   both lung bases, likely atelectasis.   3. Large amount of ascites has increased in volume compared with the   previous study. There is a large anterior component which now   contains an air-level and multiple internal air bubbles as well as   diffuse peritoneal enhancement, highly suspicious for peritonitis.   Cannot exclude bowel perforation. Correlate clinically. Repeat   paracentesis and general surgical consultation may be helpful for   evaluation.   4. Morphologic changes of cirrhosis with probable chronic passive   congestion of the liver. No focal hepatic lesions identified.   5. Chronic renal atrophy with poor parenchymal enhancement and   excretion bilaterally, consistent with chronic medical renal   disease.   6. Aortic Atherosclerosis (ICD10-I70.0).   7. These results were called by telephone at the time of   interpretation on 11/26/2023 at 4:41 pm to Dr Alfred Levins, who verbally   acknowledged these results.    Assessment / Plan / Recommendation  Clinical Impression  Pt presents with adequate oropharyngeal abilities when consuming thin liquids via cup and straw at bedside. he stated that  his stomach felt "a little better" and he was able to consume small amount of dysphagia 2 snack without any overt s/s of dysphagia or aspiration. Pt appears to be good historian and reports in creased chest pain when gulping liquids suspect d/t ingestion of air with gulping. Pt is aware to take small sips and bites as he states it  feels better when he does this. Education provided on aspiration and reflux precautions (in light of pt's vomiting). Pt voiced understanding. At this time, current diet appears appropriate. Skilled ST services are not indicated. SLP Visit Diagnosis: Dysphagia, unspecified (R13.10)    Aspiration Risk  Mild aspiration risk (d/t vomiting)    Diet Recommendation Dysphagia 2 (Fine chop);Thin liquid    Liquid Administration via: Cup;Straw Medication Administration: Whole meds with liquid Supervision: Patient able to self feed Compensations: Minimize environmental distractions;Slow rate;Small sips/bites Postural Changes: Seated upright at 90 degrees;Remain upright for at least 30 minutes after po intake    Other  Recommendations Oral Care Recommendations: Oral care BID    Recommendations for follow up therapy are one component of a multi-disciplinary discharge planning process, led by the attending physician.  Recommendations may be updated based on patient status, additional functional criteria and insurance authorization.  Follow up Recommendations No SLP follow up      Assistance Recommended at Discharge  N/A  Functional Status Assessment Patient has not had a recent decline in their functional status  Frequency and Duration   N/A         Prognosis   N/A     Swallow Study   General Date of Onset: 11/26/23 HPI: [name] is a 63 y.o. male with past medical history of hypertension, diabetes, ESRD on HD (MWF), hepatitis C, and anemia who presents to the ED complaining of abdominal pain.  Patient reports that he has been dealing with discomfort in his upper abdomen for almost 2 weeks now.  He states that he has very poor appetite due to this and symptoms have been progressively worsening.  Last night, he began to feel increasingly nauseous and has had multiple episodes of vomiting, unable to keep anything down in the past 24 hours.     IMPRESSION:   1. No evidence of acute pulmonary  embolism or other acute vascular   findings in the chest.   2. Small bilateral pleural effusions with dependent opacities in   both lung bases, likely atelectasis.   3. Large amount of ascites has increased in volume compared with the   previous study. There is a large anterior component which now   contains an air-level and multiple internal air bubbles as well as   diffuse peritoneal enhancement, highly suspicious for peritonitis.   Cannot exclude bowel perforation. Correlate clinically. Repeat   paracentesis and general surgical consultation may be helpful for   evaluation.   4. Morphologic changes of cirrhosis with probable chronic passive   congestion of the liver. No focal hepatic lesions identified.   5. Chronic renal atrophy with poor parenchymal enhancement and   excretion bilaterally, consistent with chronic medical renal   disease.   6. Aortic Atherosclerosis (ICD10-I70.0).   7. These results were called by telephone at the time of   interpretation on 11/26/2023 at 4:41 pm to Dr Alfred Levins, who verbally   acknowledged these results. Type of Study: Bedside Swallow Evaluation Previous Swallow Assessment: none in chart Diet Prior to this Study: Dysphagia 2 (finely chopped);Thin liquids (Level 0) Temperature Spikes Noted: No Respiratory  Status: Room air History of Recent Intubation: No Behavior/Cognition: Alert;Cooperative;Pleasant mood Oral Cavity Assessment: Within Functional Limits Oral Care Completed by SLP: No Oral Cavity - Dentition: Adequate natural dentition Vision: Functional for self-feeding Self-Feeding Abilities: Able to feed self Patient Positioning: Upright in bed Baseline Vocal Quality: Normal Volitional Cough: Strong Volitional Swallow: Able to elicit    Oral/Motor/Sensory Function Overall Oral Motor/Sensory Function: Within functional limits   Ice Chips Ice chips: Not tested   Thin Liquid Thin Liquid: Within functional limits Presentation: Cup;Self Fed;Straw    Nectar Thick  Nectar Thick Liquid: Not tested   Honey Thick Honey Thick Liquid: Not tested   Puree Puree: Not tested   Solid     Solid: Within functional limits Presentation: Self Fed Other Comments: dysphagia 2 textures     Demarian Epps B. Dreama Saa, M.S., CCC-SLP, Tree surgeon Certified Brain Injury Specialist [facility]  [facility] Office [phone] Ascom [phone] Fax [phone]

## 2023-11-27 NOTE — Progress Notes (Signed)
[facility]  ROUNDING NOTE   Subjective:   Casey Franco is a 63 y.o. male with past medical history of HTN, ESRD, DM-2, and Hep C. Patient presents to emergency department with abdominal pain. He has been admitted for Intractable vomiting [R11.10] Abdominal pain [R10.9] Pneumonia of both lungs due to infectious organism, unspecified part of lung [J18.9] Sepsis without acute organ dysfunction, due to unspecified organism [facility]) [A41.9]  Patient is known to our practice and receives outpatient dialysis at [facility] on a MWF, last treatment on Tuesday.  Patient initially complained of chest pain with vomiting the morning of ED arrival.States it began as abdominal pain that gradually moved to his chest.  Denies shortness of breath or cough.  Patient did recently receive a paracentesis with 350 mL of fluid removed.  Labs on ED arrival significant for serum bicarb 18, BUN 102, creatinine 11.92 with GFR 4, albumin 2.8, troponin 172, hemoglobin 11.0.  Positive blood cultures.  Chest x-ray shows bilateral interstitial and bibasilar opacities concerning for pulmonary edema versus atelectasis or pneumonia.  CT chest abdomen pelvis negative for pulmonary embolism, confirms small bilateral pleural effusions and lung bases, large ascites.  Paracentesis completed yesterday, 1.6 L of fluid removed.  We have been consulted to manage dialysis needs.   Objective:  Vital signs in last 24 hours:  Temp:  [97.9 F (36.6 C)-98.5 F (36.9 C)] 98.4 F (36.9 C) (02/14 1241) Pulse Rate:  [92-104] 102 (02/14 1241) Resp:  [18-24] 19 (02/14 1241) BP: (139-165)/(94-112) 151/101 (02/14 1241) SpO2:  [93 %-97 %] 93 % (02/14 1241)  Weight change:  Filed Weights   11/26/23 1114  Weight: 68 kg    Intake/Output: I/O last 3 completed shifts: In: 350 [IV Piggyback:350] Out: -    Intake/Output this shift:  No intake/output data recorded.  Physical Exam: General: NAD  Head: Normocephalic,  atraumatic. Moist oral mucosal membranes  Eyes: Anicteric  Lungs:  Clear to auscultation, normal effort  Heart: Regular rate and rhythm  Abdomen:  Soft, nontender,   Extremities: Trace to 1+ peripheral edema.  Neurologic: Alert and oriented, moving all four extremities  Skin: No lesions  Access: Right chest tunneled catheter    Basic Metabolic Panel: Recent Labs  Lab 11/26/23 1122 11/27/23 0421  NA 142 141  K 4.5 5.0  CL 100 102  CO2 18* 17*  GLUCOSE 112* 88  BUN 102* 116*  CREATININE 11.92* 12.16*  CALCIUM 9.4 8.7*    Liver Function Tests: Recent Labs  Lab 11/26/23 1122 11/27/23 0421  AST 30 24  ALT 16 14  ALKPHOS 96 70  BILITOT 2.2* 1.8*  PROT 7.5 6.7  ALBUMIN 2.8* 2.4*   Recent Labs  Lab 11/26/23 1122  LIPASE 35   Recent Labs  Lab 11/26/23 1921  AMMONIA 67*    CBC: Recent Labs  Lab 11/26/23 1122 11/26/23 2344 11/27/23 0421 11/27/23 1125  WBC 15.8* 15.8* 14.7* 14.0*  NEUTROABS 13.4*  --   --   --   HGB 11.0* 9.9* 9.7* 9.4*  HCT 33.1* 29.9* 28.6* 28.4*  MCV 92.5 92.3 90.8 90.2  PLT 308 246 241 248    Cardiac Enzymes: No results for input(s): "CKTOTAL", "CKMB", "CKMBINDEX", "TROPONINI" in the last 168 hours.  BNP: Invalid input(s): "POCBNP"  CBG: Recent Labs  Lab 11/27/23 0757  GLUCAP 79    Microbiology: Results for orders placed or performed during the hospital encounter of 11/26/23  Culture, blood (Routine x 2)  Status: None (Preliminary result)   Collection Time: 11/26/23 11:22 AM   Specimen: BLOOD  Result Value Ref Range Status   Specimen Description BLOOD RIGHT ANTECUBITAL  Final   Special Requests   Final    BOTTLES DRAWN AEROBIC AND ANAEROBIC Blood Culture results may not be optimal due to an inadequate volume of blood received in culture bottles   Culture   Final    NO GROWTH < 24 HOURS Performed at [facility], [address] [place], [place] [address]    Report Status PENDING  Incomplete  Culture,  blood (Routine x 2)     Status: None (Preliminary result)   Collection Time: 11/26/23 12:23 PM   Specimen: BLOOD  Result Value Ref Range Status   Specimen Description BLOOD BLOOD LEFT ARM  Final   Special Requests   Final    BOTTLES DRAWN AEROBIC AND ANAEROBIC Blood Culture results may not be optimal due to an inadequate volume of blood received in culture bottles   Culture  Setup Time   Final    Organism ID to follow GRAM POSITIVE COCCI AEROBIC BOTTLE ONLY CRITICAL RESULT CALLED TO, READ BACK BY AND VERIFIED WITH: BRIANA ALLEY ON 11/27/23 AT 1107 QSD Performed at [facility], [address] [place], [place] [address]    Culture GRAM POSITIVE COCCI  Final   Report Status PENDING  Incomplete  Blood Culture ID Panel (Reflexed)     Status: Abnormal   Collection Time: 11/26/23 12:23 PM  Result Value Ref Range Status   Enterococcus faecalis NOT DETECTED NOT DETECTED Final   Enterococcus Faecium NOT DETECTED NOT DETECTED Final   Listeria monocytogenes NOT DETECTED NOT DETECTED Final   Staphylococcus species DETECTED (A) NOT DETECTED Final    Comment: CRITICAL RESULT CALLED TO, READ BACK BY AND VERIFIED WITH: BRIANA ALLEY ON 11/27/23 AT 1107 QSD    Staphylococcus aureus (BCID) NOT DETECTED NOT DETECTED Final   Staphylococcus epidermidis DETECTED (A) NOT DETECTED Final    Comment: CRITICAL RESULT CALLED TO, READ BACK BY AND VERIFIED WITH: BRIANA ALLEY ON 11/27/23 AT 1107 QSD    Staphylococcus lugdunensis NOT DETECTED NOT DETECTED Final   Streptococcus species NOT DETECTED NOT DETECTED Final   Streptococcus agalactiae NOT DETECTED NOT DETECTED Final   Streptococcus pneumoniae NOT DETECTED NOT DETECTED Final   Streptococcus pyogenes NOT DETECTED NOT DETECTED Final   A.calcoaceticus-baumannii NOT DETECTED NOT DETECTED Final   Bacteroides fragilis NOT DETECTED NOT DETECTED Final   Enterobacterales NOT DETECTED NOT DETECTED Final   Enterobacter cloacae complex NOT DETECTED NOT  DETECTED Final   Escherichia coli NOT DETECTED NOT DETECTED Final   Klebsiella aerogenes NOT DETECTED NOT DETECTED Final   Klebsiella oxytoca NOT DETECTED NOT DETECTED Final   Klebsiella pneumoniae NOT DETECTED NOT DETECTED Final   Proteus species NOT DETECTED NOT DETECTED Final   Salmonella species NOT DETECTED NOT DETECTED Final   Serratia marcescens NOT DETECTED NOT DETECTED Final   Haemophilus influenzae NOT DETECTED NOT DETECTED Final   Neisseria meningitidis NOT DETECTED NOT DETECTED Final   Pseudomonas aeruginosa NOT DETECTED NOT DETECTED Final   Stenotrophomonas maltophilia NOT DETECTED NOT DETECTED Final   Candida albicans NOT DETECTED NOT DETECTED Final   Candida auris NOT DETECTED NOT DETECTED Final   Candida glabrata NOT DETECTED NOT DETECTED Final   Candida krusei NOT DETECTED NOT DETECTED Final   Candida parapsilosis NOT DETECTED NOT DETECTED Final   Candida tropicalis NOT DETECTED NOT DETECTED Final   Cryptococcus  neoformans/gattii NOT DETECTED NOT DETECTED Final   Methicillin resistance mecA/C NOT DETECTED NOT DETECTED Final    Comment: Performed at [facility], [address] [place], [place] [address]  Body fluid culture w Gram Stain     Status: None (Preliminary result)   Collection Time: 11/26/23  8:55 PM   Specimen: PATH Cytology Peritoneal fluid  Result Value Ref Range Status   Specimen Description   Final    PERITONEAL CYTO Performed at [facility], [address] [place], [place] [address]    Special Requests   Final    NONE Performed at [facility], [address] [place], [place] [address]    Gram Stain   Final    FEW WBC PRESENT,BOTH PMN AND MONONUCLEAR FEW GRAM POSITIVE COCCI IN CHAINS MODERATE GRAM NEGATIVE RODS FEW GRAM POSITIVE RODS Performed at [facility], [address], [place], [place] [address]    Culture PENDING  Incomplete   Report Status PENDING  Incomplete    Coagulation Studies: Recent  Labs    11/26/23 1223  LABPROT 22.4*  INR 1.9*    Urinalysis: No results for input(s): "COLORURINE", "LABSPEC", "PHURINE", "GLUCOSEU", "HGBUR", "BILIRUBINUR", "KETONESUR", "PROTEINUR", "UROBILINOGEN", "NITRITE", "LEUKOCYTESUR" in the last 72 hours.  Invalid input(s): "APPERANCEUR"    Imaging: US Paracentesis Result Date: 11/27/2023 INDICATION: 63 year old with large volume ascites containing a large amount of air. Request for a diagnostic and therapeutic paracentesis. EXAM: ULTRASOUND GUIDED PARACENTESIS MEDICATIONS: None. COMPLICATIONS: None immediate. PROCEDURE: Informed written consent was obtained from the patient after a discussion of the risks, benefits and alternatives to treatment. A timeout was performed prior to the initiation of the procedure. Initial ultrasound scanning demonstrates a large amount of complex ascites throughout the abdomen. Large pocket of fluid just below the umbilicus at the midline was targeted. This area was targeted because it was also associated with a large amount of peritoneal air. The periumbilical area was prepped with chlorhexidine and sterile field was created. Skin was anesthetized with 1% lidocaine. A small incision was made. Following this, a 6 Fr Safe-T-Centesis catheter was introduced. An ultrasound image was saved for documentation purposes. The paracentesis was performed. The catheter was removed and a dressing was applied. The patient tolerated the procedure well without FINDINGS: A total of approximately 1.6 L of opaque yellow fluid was removed. Peritoneal catheter stopped draining after 1.6 L although there was still a large amount of intra-abdominal fluid. Catheter stopped draining likely due to the complexity and loculations within this fluid. Samples were sent to the laboratory as requested by the clinical team. IMPRESSION: Successful ultrasound-guided paracentesis yielding 1.6 liters of peritoneal fluid. Loculated ascites containing a large amount  of air. Electronically Signed   By: Richarda Overlie M.D.   On: 11/27/2023 07:46   CT ABDOMEN PELVIS W CONTRAST Result Date: 11/26/2023 CLINICAL DATA:  Pulmonary embolism suspected, high probability. Chest pain. Ground-glass emesis and abdominal distension. History of paracentesis. EXAM: CT ANGIOGRAPHY CHEST CT ABDOMEN AND PELVIS WITH CONTRAST TECHNIQUE: Multidetector CT imaging of the chest was performed using the standard protocol during bolus administration of intravenous contrast. Multiplanar CT image reconstructions and MIPs were obtained to evaluate the vascular anatomy. Multidetector CT imaging of the abdomen and pelvis was performed using the standard protocol during bolus administration of intravenous contrast. RADIATION DOSE REDUCTION: This exam was performed according to the departmental dose-optimization program which includes automated exposure control, adjustment of the mA and/or kV according to patient size and/or use of  iterative reconstruction technique. CONTRAST:  OMNIPAQUE IOHEXOL 350 MG/ML SOLN COMPARISON:  Chest radiographs 11/26/2023 and 11/19/2023. Abdominopelvic CT 11/19/2023 and 11/12/2023. FINDINGS: CTA CHEST FINDINGS Cardiovascular: The pulmonary arteries are well opacified with contrast to the level of the segmental branches. There is no evidence of acute pulmonary embolism. There is atherosclerosis of the aorta, great vessels and coronary arteries. The coronary artery involvement is severe. No acute systemic arterial abnormalities are identified. Probable calcifications of the aortic valve. The heart is mildly enlarged. No significant pericardial effusion. Mediastinum/Nodes: There are no enlarged mediastinal, hilar or axillary lymph nodes. Mild diffuse esophageal wall thickening. Lungs/Pleura: Small bilateral pleural effusions. No pneumothorax. Dependent opacities in both lung bases most likely represent atelectasis. No confluent airspace disease or suspicious pulmonary nodule.  Musculoskeletal/Chest wall: No chest wall mass or suspicious osseous findings. CT ABDOMEN AND PELVIS FINDINGS Hepatobiliary: Morphologic changes of cirrhosis again noted with contour irregularity of the liver and relative enlargement of the caudate and left lobes. Early images demonstrate mildly heterogeneous enhancement, likely due to chronic passive congestion. No focal lesions are identified. No evidence of gallstones, gallbladder wall thickening or biliary dilatation. Pancreas: Unremarkable. No pancreatic ductal dilatation or surrounding inflammatory changes. Spleen: Normal in size without focal abnormality. Adrenals/Urinary Tract: Both adrenal glands appear normal. Chronic renal atrophy with poor parenchymal enhancement and excretion bilaterally, consistent with chronic medical renal disease. No hydronephrosis or focal abnormality identified. The urinary bladder is decompressed. Stomach/Bowel: There may be a small amount of enteric contrast within the colon. The stomach appears unremarkable for its degree of distention. There is peripheral displacement of bowel by increasing complex ascites in the central abdomen, further described below. No evidence of bowel wall thickening, distension or focal surrounding inflammation. The appendix appears normal. Vascular/Lymphatic: Prominent lymph nodes in the porta hepatis and retroperitoneum, likely reactive. Mild aortic and branch vessel atherosclerosis without evidence of aneurysm or large vessel occlusion. The portal, splenic superior mesenteric veins are patent. Reproductive: The prostate gland and seminal vesicles appear unremarkable. Other: A large amount of ascites has increased in volume compared with the previous study. There is a large anterior component which now contains an air-level and multiple internal air bubbles as well as diffuse peritoneal enhancement, highly suspicious for peritonitis. Cannot exclude bowel perforation. Anterior loculated component  measures up to 28.5 x 13.6 cm transverse and 25.4 cm craniocaudal. The fluid around the liver and in the pericolic gutters has not significantly increased in volume. Musculoskeletal: No acute or significant osseous findings. Scattered endplate changes in the spine, likely degenerative and/or related to chronic renal failure. Review of the MIP images confirms the above findings. IMPRESSION: 1. No evidence of acute pulmonary embolism or other acute vascular findings in the chest. 2. Small bilateral pleural effusions with dependent opacities in both lung bases, likely atelectasis. 3. Large amount of ascites has increased in volume compared with the previous study. There is a large anterior component which now contains an air-level and multiple internal air bubbles as well as diffuse peritoneal enhancement, highly suspicious for peritonitis. Cannot exclude bowel perforation. Correlate clinically. Repeat paracentesis and general surgical consultation may be helpful for evaluation. 4. Morphologic changes of cirrhosis with probable chronic passive congestion of the liver. No focal hepatic lesions identified. 5. Chronic renal atrophy with poor parenchymal enhancement and excretion bilaterally, consistent with chronic medical renal disease. 6.  Aortic Atherosclerosis (ICD10-I70.0). 7. These results were called by telephone at the time of interpretation on 11/26/2023 at 4:41 pm to Dr Alfred Levins, who verbally  acknowledged these results. Electronically Signed   By: Carey Bullocks M.D.   On: 11/26/2023 16:45   CT Angio Chest PE W/Cm &/Or Wo Cm Result Date: 11/26/2023 CLINICAL DATA:  Pulmonary embolism suspected, high probability. Chest pain. Ground-glass emesis and abdominal distension. History of paracentesis. EXAM: CT ANGIOGRAPHY CHEST CT ABDOMEN AND PELVIS WITH CONTRAST TECHNIQUE: Multidetector CT imaging of the chest was performed using the standard protocol during bolus administration of intravenous contrast. Multiplanar CT  image reconstructions and MIPs were obtained to evaluate the vascular anatomy. Multidetector CT imaging of the abdomen and pelvis was performed using the standard protocol during bolus administration of intravenous contrast. RADIATION DOSE REDUCTION: This exam was performed according to the departmental dose-optimization program which includes automated exposure control, adjustment of the mA and/or kV according to patient size and/or use of iterative reconstruction technique. CONTRAST:  OMNIPAQUE IOHEXOL 350 MG/ML SOLN COMPARISON:  Chest radiographs 11/26/2023 and 11/19/2023. Abdominopelvic CT 11/19/2023 and 11/12/2023. FINDINGS: CTA CHEST FINDINGS Cardiovascular: The pulmonary arteries are well opacified with contrast to the level of the segmental branches. There is no evidence of acute pulmonary embolism. There is atherosclerosis of the aorta, great vessels and coronary arteries. The coronary artery involvement is severe. No acute systemic arterial abnormalities are identified. Probable calcifications of the aortic valve. The heart is mildly enlarged. No significant pericardial effusion. Mediastinum/Nodes: There are no enlarged mediastinal, hilar or axillary lymph nodes. Mild diffuse esophageal wall thickening. Lungs/Pleura: Small bilateral pleural effusions. No pneumothorax. Dependent opacities in both lung bases most likely represent atelectasis. No confluent airspace disease or suspicious pulmonary nodule. Musculoskeletal/Chest wall: No chest wall mass or suspicious osseous findings. CT ABDOMEN AND PELVIS FINDINGS Hepatobiliary: Morphologic changes of cirrhosis again noted with contour irregularity of the liver and relative enlargement of the caudate and left lobes. Early images demonstrate mildly heterogeneous enhancement, likely due to chronic passive congestion. No focal lesions are identified. No evidence of gallstones, gallbladder wall thickening or biliary dilatation. Pancreas: Unremarkable. No  pancreatic ductal dilatation or surrounding inflammatory changes. Spleen: Normal in size without focal abnormality. Adrenals/Urinary Tract: Both adrenal glands appear normal. Chronic renal atrophy with poor parenchymal enhancement and excretion bilaterally, consistent with chronic medical renal disease. No hydronephrosis or focal abnormality identified. The urinary bladder is decompressed. Stomach/Bowel: There may be a small amount of enteric contrast within the colon. The stomach appears unremarkable for its degree of distention. There is peripheral displacement of bowel by increasing complex ascites in the central abdomen, further described below. No evidence of bowel wall thickening, distension or focal surrounding inflammation. The appendix appears normal. Vascular/Lymphatic: Prominent lymph nodes in the porta hepatis and retroperitoneum, likely reactive. Mild aortic and branch vessel atherosclerosis without evidence of aneurysm or large vessel occlusion. The portal, splenic superior mesenteric veins are patent. Reproductive: The prostate gland and seminal vesicles appear unremarkable. Other: A large amount of ascites has increased in volume compared with the previous study. There is a large anterior component which now contains an air-level and multiple internal air bubbles as well as diffuse peritoneal enhancement, highly suspicious for peritonitis. Cannot exclude bowel perforation. Anterior loculated component measures up to 28.5 x 13.6 cm transverse and 25.4 cm craniocaudal. The fluid around the liver and in the pericolic gutters has not significantly increased in volume. Musculoskeletal: No acute or significant osseous findings. Scattered endplate changes in the spine, likely degenerative and/or related to chronic renal failure. Review of the MIP images confirms the above findings. IMPRESSION: 1. No evidence of acute pulmonary embolism  or other acute vascular findings in the chest. 2. Small bilateral  pleural effusions with dependent opacities in both lung bases, likely atelectasis. 3. Large amount of ascites has increased in volume compared with the previous study. There is a large anterior component which now contains an air-level and multiple internal air bubbles as well as diffuse peritoneal enhancement, highly suspicious for peritonitis. Cannot exclude bowel perforation. Correlate clinically. Repeat paracentesis and general surgical consultation may be helpful for evaluation. 4. Morphologic changes of cirrhosis with probable chronic passive congestion of the liver. No focal hepatic lesions identified. 5. Chronic renal atrophy with poor parenchymal enhancement and excretion bilaterally, consistent with chronic medical renal disease. 6.  Aortic Atherosclerosis (ICD10-I70.0). 7. These results were called by telephone at the time of interpretation on 11/26/2023 at 4:41 pm to Dr Alfred Levins, who verbally acknowledged these results. Electronically Signed   By: Carey Bullocks M.D.   On: 11/26/2023 16:45   DG Chest 2 View Result Date: 11/26/2023 CLINICAL DATA:  Substernal chest pain EXAM: CHEST - 2 VIEW COMPARISON:  Chest radiograph dated 11/19/2023 FINDINGS: Lines/tubes: Right internal jugular venous catheter tip projects over the right atrium. Lungs: Well inflated lungs. Bilateral interstitial and bibasilar patchy opacities. Pleura: No pneumothorax or pleural effusion. Heart/mediastinum: Similar mildly enlarged cardiomediastinal silhouette. Bones: No acute osseous abnormality. IMPRESSION: Bilateral interstitial and bibasilar patchy opacities, which may represent a combination of pulmonary edema and/or atelectasis, aspiration, or pneumonia. Electronically Signed   By: Agustin Cree M.D.   On: 11/26/2023 13:12     Medications:    cefTRIAXone (ROCEPHIN)  IV      amLODipine  10 mg Oral Daily   Chlorhexidine Gluconate Cloth  6 each Topical Q0600   irbesartan  300 mg Oral Daily   lactulose  20 g Oral BID    pantoprazole (PROTONIX) IV  40 mg Intravenous Q12H   tamsulosin  0.4 mg Oral Daily   acetaminophen, alteplase, fentaNYL (SUBLIMAZE) injection, heparin, hydrALAZINE, ondansetron (ZOFRAN) IV, oxyCODONE  Assessment/ Plan:  Mr. Casey Franco is a 63 y.o.  male with HTN, ESRD, DM-2, Hep C.  Patient presents with chest pain and has been admitted for Intractable vomiting [R11.10] Abdominal pain [R10.9] Pneumonia of both lungs due to infectious organism, unspecified part of lung [J18.9] Sepsis without acute organ dysfunction, due to unspecified organism [facility]) [A41.9]  [facility]/MWF/right chest PermCath/71.0 kg  End-stage renal disease on hemodialysis.  Missed treatment on Wednesday.  Patient will receive scheduled dialysis today.  2. Anemia of chronic kidney disease Lab Results  Component Value Date   HGB 9.4 (L) 11/27/2023    Hemoglobin within desired range.  Patient does receive Mircera at outpatient clinic.  Will consider low-dose EPO.  3. Secondary Hyperparathyroidism: with outpatient labs: PTH 351, phosphorus 6.6, calcium 8.1 on 11/23/2023.   Lab Results  Component Value Date   PTH 31 08/30/2018   CALCIUM 8.7 (L) 11/27/2023   PHOS 11.2 (H) 02/04/2021    Patient prescribed calcium acetate outpatient.  Will continue to monitor bone minerals during this admission.  4.  Hypertension with chronic kidney disease.  Home regimen includes furosemide, hydralazine, amlodipine, and irbesartan.   LOS: 1 Aithan Farrelly 2/14/202512:52 PM

## 2023-11-27 NOTE — ED Notes (Signed)
Pt to dialysis.

## 2023-11-27 NOTE — Progress Notes (Addendum)
Progress Note    Casey Franco  BJY:[id number] DOB: 04/02/1961  DOA: 11/26/2023 PCP: [facility]      Brief Narrative:    Medical records reviewed and are as summarized below:  DAKARI CREGGER is a 63 y.o. 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  He also complained of nausea, increasing abdominal distention and multiple episodes of vomiting.      Assessment/Plan:   Principal Problem:   Spontaneous bacterial peritonitis (HCC) Active Problems:   Abdominal pain   Cirrhosis of liver with ascites (HCC)   Myocardial injury   Essential hypertension   ESRD (end stage renal disease) (HCC)   Coffee ground emesis    Body mass index is 20.33 kg/m.   Spontaneous bacterial peritonitis, abdominal pain, liver cirrhosis with ascites: S/p paracentesis with removal of 1600 mL of fluid on 11/26/2023.  Fluid positive for SBP (ascitic fluid total nucleated cells 1,326, percentage neutrophils was 68% and absolute PMN count 901) Continue IV ceftriaxone. He was evaluated by general surgeon.  Low suspicion for perforated abdominal viscus at this time. Staph epidermidis bacteremia: 1 out of 4 bottles positive for Staph epidermidis.  This is likely a contaminant.  Repeat blood cultures.   Hematemesis: This is probably from multiple episodes of vomiting.  Improved.  H&H is stable.   Elevated troponins: Troponin is 172, 138, 127 and 128.  This is likely from demand ischemia. Cardiac murmur loudest in mitral area: 2D echo has been ordered for further evaluation.   ESRD on hemodialysis: Follow-up with nephrologist for hemodialysis.   Dysphagia: Speech therapist recommended dysphagia 2 diet, thin liquids.    Comorbidities include hypertension, hyperlipidemia, BPH   Prognosis  is guarded  Consult palliative care team.    Diet Order             DIET DYS 2 Fluid consistency: Thin  Diet effective now                            Consultants: General surgeon Interventional radiologist  Procedures: Paracentesis with removal of 1,600 mL of fluid on 11/26/2023    Medications:    amLODipine  10 mg Oral Daily   Chlorhexidine Gluconate Cloth  6 each Topical Q0600   irbesartan  300 mg Oral Daily   lactulose  20 g Oral BID   pantoprazole (PROTONIX) IV  40 mg Intravenous Q12H   tamsulosin  0.4 mg Oral Daily   Continuous Infusions:  cefTRIAXone (ROCEPHIN)  IV       Anti-infectives (From admission, onward)    Start     Dose/Rate Route Frequency Ordered Stop   11/27/23 1300  cefTRIAXone (ROCEPHIN) 2 g in sodium chloride 0.9 % 100 mL IVPB        2 g 200 mL/hr over 30 Minutes Intravenous Every 24 hours 11/26/23 2027     11/27/23 1200  vancomycin (VANCOREADY) IVPB 1500 mg/300 mL  Status:  Discontinued        1,500 mg 150 mL/hr over 120 Minutes Intravenous  Once 11/27/23 1131 11/27/23 1223   11/26/23 1345  cefTRIAXone (ROCEPHIN) 2 g in sodium chloride 0.9 % 100 mL IVPB        2 g 200 mL/hr over 30 Minutes Intravenous Once 11/26/23 1333 11/26/23  1432   11/26/23 1345  azithromycin (ZITHROMAX) 500 mg in sodium chloride 0.9 % 250 mL IVPB        500 mg 250 mL/hr over 60 Minutes Intravenous  Once 11/26/23 1333 11/26/23 1633              Family Communication/Anticipated D/C date and plan/Code Status   DVT prophylaxis: SCDs Start: 11/26/23 2029     Code Status: Full Code  Family Communication: None Disposition Plan: Plan to discharge home   Status is: Inpatient Remains inpatient appropriate because: Spontaneous bacterial peritonitis       Subjective:   Interval events noted.  He complains of abdominal pain.  No shortness of breath or chest pain.  No fever or chills.  Objective:    Vitals:   11/27/23 0630 11/27/23 1100  11/27/23 1200 11/27/23 1241  BP: (!) 149/98 (!) 141/98  (!) 151/101  Pulse: 92 (!) 101 (!) 102 (!) 102  Resp: (!) 21 20 (!) 22 19  Temp:  97.9 F (36.6 C)  98.4 F (36.9 C)  TempSrc:    Oral  SpO2: 94% 96% 96%   Weight:      Height:       No data found.   Intake/Output Summary (Last 24 hours) at 11/27/2023 1242 Last data filed at 11/26/2023 1633 Gross per 24 hour  Intake 350 ml  Output --  Net 350 ml   Filed Weights   11/26/23 1114  Weight: 68 kg    Exam:  GEN: NAD SKIN: Warm and dry EYES: No pallor or icterus ENT: MMM CV: RRR PULM: CTA B ABD: soft, distended, no tenderness, no rebound tenderness or guarding,  +BS CNS: AAO x 3, non focal EXT: No edema or tenderness        Data Reviewed:   I have personally reviewed following labs and imaging studies:  Labs: Labs show the following:   Basic Metabolic Panel: Recent Labs  Lab 11/26/23 1122 11/27/23 0421  NA 142 141  K 4.5 5.0  CL 100 102  CO2 18* 17*  GLUCOSE 112* 88  BUN 102* 116*  CREATININE 11.92* 12.16*  CALCIUM 9.4 8.7*   GFR Estimated Creatinine Clearance: 6 mL/min (A) (by C-G formula based on SCr of 12.16 mg/dL (H)). Liver Function Tests: Recent Labs  Lab 11/26/23 1122 11/27/23 0421  AST 30 24  ALT 16 14  ALKPHOS 96 70  BILITOT 2.2* 1.8*  PROT 7.5 6.7  ALBUMIN 2.8* 2.4*   Recent Labs  Lab 11/26/23 1122  LIPASE 35   Recent Labs  Lab 11/26/23 1921  AMMONIA 67*   Coagulation profile Recent Labs  Lab 11/26/23 1223  INR 1.9*    CBC: Recent Labs  Lab 11/26/23 1122 11/26/23 2344 11/27/23 0421 11/27/23 1125  WBC 15.8* 15.8* 14.7* 14.0*  NEUTROABS 13.4*  --   --   --   HGB 11.0* 9.9* 9.7* 9.4*  HCT 33.1* 29.9* 28.6* 28.4*  MCV 92.5 92.3 90.8 90.2  PLT 308 246 241 248   Cardiac Enzymes: No results for input(s): "CKTOTAL", "CKMB", "CKMBINDEX", "TROPONINI" in the last 168 hours. BNP (last 3 results) No results for input(s): "PROBNP" in the last 8760  hours. CBG: Recent Labs  Lab 11/27/23 0757  GLUCAP 79   D-Dimer: No results for input(s): "DDIMER" in the last 72 hours. Hgb A1c: Recent Labs    11/26/23 1122  HGBA1C 5.2   Lipid Profile: Recent Labs    11/27/23 0421  CHOL 67  HDL <10*  LDLCALC NOT CALCULATED  TRIG 104  CHOLHDL NOT CALCULATED   Thyroid function studies: No results for input(s): "TSH", "T4TOTAL", "T3FREE", "THYROIDAB" in the last 72 hours.  Invalid input(s): "FREET3" Anemia work up: No results for input(s): "VITAMINB12", "FOLATE", "FERRITIN", "TIBC", "IRON", "RETICCTPCT" in the last 72 hours. Sepsis Labs: Recent Labs  Lab 11/26/23 1122 11/26/23 2344 11/27/23 0421 11/27/23 1125  WBC 15.8* 15.8* 14.7* 14.0*  LATICACIDVEN 1.7  --   --   --     Microbiology Recent Results (from the past 240 hours)  Resp panel by RT-PCR (RSV, Flu A&B, Covid) Anterior Nasal Swab     Status: None   Collection Time: 11/19/23  9:11 AM   Specimen: Anterior Nasal Swab  Result Value Ref Range Status   SARS Coronavirus 2 by RT PCR NEGATIVE NEGATIVE Final    Comment: (NOTE) SARS-CoV-2 target nucleic acids are NOT DETECTED.  The SARS-CoV-2 RNA is generally detectable in upper respiratory specimens during the acute phase of infection. The lowest concentration of SARS-CoV-2 viral copies this assay can detect is 138 copies/mL. A negative result does not preclude SARS-Cov-2 infection and should not be used as the sole basis for treatment or other patient management decisions. A negative result may occur with  improper specimen collection/handling, submission of specimen other than nasopharyngeal swab, presence of viral mutation(s) within the areas targeted by this assay, and inadequate number of viral copies(<138 copies/mL). A negative result must be combined with clinical observations, patient history, and epidemiological information. The expected result is Negative.  Fact Sheet for Patients:   BloggerCourse.com  Fact Sheet for Healthcare Providers:  SeriousBroker.it  This test is no t yet approved or cleared by the [place] FDA and  has been authorized for detection and/or diagnosis of SARS-CoV-2 by FDA under an Emergency Use Authorization (EUA). This EUA will remain  in effect (meaning this test can be used) for the duration of the COVID-19 declaration under Section 564(b)(1) of the Act, 21 U.S.C.section 360bbb-3(b)(1), unless the authorization is terminated  or revoked sooner.       Influenza A by PCR NEGATIVE NEGATIVE Final   Influenza B by PCR NEGATIVE NEGATIVE Final    Comment: (NOTE) The Xpert Xpress SARS-CoV-2/FLU/RSV plus assay is intended as an aid in the diagnosis of influenza from Nasopharyngeal swab specimens and should not be used as a sole basis for treatment. Nasal washings and aspirates are unacceptable for Xpert Xpress SARS-CoV-2/FLU/RSV testing.  Fact Sheet for Patients: BloggerCourse.com  Fact Sheet for Healthcare Providers: SeriousBroker.it  This test is not yet approved or cleared by the [place] FDA and has been authorized for detection and/or diagnosis of SARS-CoV-2 by FDA under an Emergency Use Authorization (EUA). This EUA will remain in effect (meaning this test can be used) for the duration of the COVID-19 declaration under Section 564(b)(1) of the Act, 21 U.S.C. section 360bbb-3(b)(1), unless the authorization is terminated or revoked.     Resp Syncytial Virus by PCR NEGATIVE NEGATIVE Final    Comment: (NOTE) Fact Sheet for Patients: BloggerCourse.com  Fact Sheet for Healthcare Providers: SeriousBroker.it  This test is not yet approved or cleared by the [place] FDA and has been authorized for detection and/or diagnosis of SARS-CoV-2 by FDA under an Emergency Use  Authorization (EUA). This EUA will remain in effect (meaning this test can be used) for the duration of the COVID-19 declaration under Section 564(b)(1) of the Act, 21 U.S.C. section 360bbb-3(b)(1), unless the authorization is terminated  or revoked.  Performed at [facility], [address], [place], [place] [address]   Body fluid culture w Gram Stain     Status: None   Collection Time: 11/24/23  9:43 AM   Specimen: PATH Cytology Peritoneal fluid  Result Value Ref Range Status   Specimen Description   Final    PERITONEAL Performed at [facility], [address], [place], [place] [address]    Special Requests   Final    NONE Performed at [facility], [address] [place], [place] [address]    Gram Stain NO WBC SEEN NO ORGANISMS SEEN   Final   Culture   Final    NO GROWTH 3 DAYS Performed at [facility], [address], [place], [place] [address]    Report Status 11/27/2023 FINAL  Final  Culture, blood (Routine x 2)     Status: None (Preliminary result)   Collection Time: 11/26/23 11:22 AM   Specimen: BLOOD  Result Value Ref Range Status   Specimen Description BLOOD RIGHT ANTECUBITAL  Final   Special Requests   Final    BOTTLES DRAWN AEROBIC AND ANAEROBIC Blood Culture results may not be optimal due to an inadequate volume of blood received in culture bottles   Culture   Final    NO GROWTH < 24 HOURS Performed at [facility], [address] [place], [place] [address]    Report Status PENDING  Incomplete  Culture, blood (Routine x 2)     Status: None (Preliminary result)   Collection Time: 11/26/23 12:23 PM   Specimen: BLOOD  Result Value Ref Range Status   Specimen Description BLOOD BLOOD LEFT ARM  Final   Special Requests   Final    BOTTLES DRAWN AEROBIC AND ANAEROBIC Blood Culture results may not be optimal due to an inadequate volume of blood received in culture bottles   Culture  Setup Time   Final    Organism  ID to follow GRAM POSITIVE COCCI AEROBIC BOTTLE ONLY CRITICAL RESULT CALLED TO, READ BACK BY AND VERIFIED WITH: BRIANA ALLEY ON 11/27/23 AT 1107 QSD Performed at [facility], [address] [place], [place] [address]    Culture GRAM POSITIVE COCCI  Final   Report Status PENDING  Incomplete  Blood Culture ID Panel (Reflexed)     Status: Abnormal   Collection Time: 11/26/23 12:23 PM  Result Value Ref Range Status   Enterococcus faecalis NOT DETECTED NOT DETECTED Final   Enterococcus Faecium NOT DETECTED NOT DETECTED Final   Listeria monocytogenes NOT DETECTED NOT DETECTED Final   Staphylococcus species DETECTED (A) NOT DETECTED Final    Comment: CRITICAL RESULT CALLED TO, READ BACK BY AND VERIFIED WITH: BRIANA ALLEY ON 11/27/23 AT 1107 QSD    Staphylococcus aureus (BCID) NOT DETECTED NOT DETECTED Final   Staphylococcus epidermidis DETECTED (A) NOT DETECTED Final    Comment: CRITICAL RESULT CALLED TO, READ BACK BY AND VERIFIED WITH: BRIANA ALLEY ON 11/27/23 AT 1107 QSD    Staphylococcus lugdunensis NOT DETECTED NOT DETECTED Final   Streptococcus species NOT DETECTED NOT DETECTED Final   Streptococcus agalactiae NOT DETECTED NOT DETECTED Final   Streptococcus pneumoniae NOT DETECTED NOT DETECTED Final   Streptococcus pyogenes NOT DETECTED NOT DETECTED Final   A.calcoaceticus-baumannii NOT DETECTED NOT DETECTED Final   Bacteroides fragilis NOT DETECTED NOT DETECTED Final   Enterobacterales NOT DETECTED NOT DETECTED Final   Enterobacter cloacae complex NOT DETECTED NOT DETECTED Final  Escherichia coli NOT DETECTED NOT DETECTED Final   Klebsiella aerogenes NOT DETECTED NOT DETECTED Final   Klebsiella oxytoca NOT DETECTED NOT DETECTED Final   Klebsiella pneumoniae NOT DETECTED NOT DETECTED Final   Proteus species NOT DETECTED NOT DETECTED Final   Salmonella species NOT DETECTED NOT DETECTED Final   Serratia marcescens NOT DETECTED NOT DETECTED Final   Haemophilus influenzae  NOT DETECTED NOT DETECTED Final   Neisseria meningitidis NOT DETECTED NOT DETECTED Final   Pseudomonas aeruginosa NOT DETECTED NOT DETECTED Final   Stenotrophomonas maltophilia NOT DETECTED NOT DETECTED Final   Candida albicans NOT DETECTED NOT DETECTED Final   Candida auris NOT DETECTED NOT DETECTED Final   Candida glabrata NOT DETECTED NOT DETECTED Final   Candida krusei NOT DETECTED NOT DETECTED Final   Candida parapsilosis NOT DETECTED NOT DETECTED Final   Candida tropicalis NOT DETECTED NOT DETECTED Final   Cryptococcus neoformans/gattii NOT DETECTED NOT DETECTED Final   Methicillin resistance mecA/C NOT DETECTED NOT DETECTED Final    Comment: Performed at [facility], [address] [place], [place] [address]  Body fluid culture w Gram Stain     Status: None (Preliminary result)   Collection Time: 11/26/23  8:55 PM   Specimen: PATH Cytology Peritoneal fluid  Result Value Ref Range Status   Specimen Description   Final    PERITONEAL CYTO Performed at [facility], [address] [place], [place] [address]    Special Requests   Final    NONE Performed at [facility], [address] [place], [place] [address]    Gram Stain   Final    FEW WBC PRESENT,BOTH PMN AND MONONUCLEAR FEW GRAM POSITIVE COCCI IN CHAINS MODERATE GRAM NEGATIVE RODS FEW GRAM POSITIVE RODS Performed at [facility], [address], [place], [place] [address]    Culture PENDING  Incomplete   Report Status PENDING  Incomplete    Procedures and diagnostic studies:  US Paracentesis Result Date: 11/27/2023 INDICATION: 63 year old with large volume ascites containing a large amount of air. Request for a diagnostic and therapeutic paracentesis. EXAM: ULTRASOUND GUIDED PARACENTESIS MEDICATIONS: None. COMPLICATIONS: None immediate. PROCEDURE: Informed written consent was obtained from the patient after a discussion of the risks, benefits and alternatives to treatment. A  timeout was performed prior to the initiation of the procedure. Initial ultrasound scanning demonstrates a large amount of complex ascites throughout the abdomen. Large pocket of fluid just below the umbilicus at the midline was targeted. This area was targeted because it was also associated with a large amount of peritoneal air. The periumbilical area was prepped with chlorhexidine and sterile field was created. Skin was anesthetized with 1% lidocaine. A small incision was made. Following this, a 6 Fr Safe-T-Centesis catheter was introduced. An ultrasound image was saved for documentation purposes. The paracentesis was performed. The catheter was removed and a dressing was applied. The patient tolerated the procedure well without FINDINGS: A total of approximately 1.6 L of opaque yellow fluid was removed. Peritoneal catheter stopped draining after 1.6 L although there was still a large amount of intra-abdominal fluid. Catheter stopped draining likely due to the complexity and loculations within this fluid. Samples were sent to the laboratory as requested by the clinical team. IMPRESSION: Successful ultrasound-guided paracentesis yielding 1.6 liters of peritoneal fluid. Loculated ascites containing a large amount of air. Electronically Signed   By: Richarda Overlie M.D.   On: 11/27/2023 07:46   CT ABDOMEN PELVIS W CONTRAST Result Date:  11/26/2023 CLINICAL DATA:  Pulmonary embolism suspected, high probability. Chest pain. Ground-glass emesis and abdominal distension. History of paracentesis. EXAM: CT ANGIOGRAPHY CHEST CT ABDOMEN AND PELVIS WITH CONTRAST TECHNIQUE: Multidetector CT imaging of the chest was performed using the standard protocol during bolus administration of intravenous contrast. Multiplanar CT image reconstructions and MIPs were obtained to evaluate the vascular anatomy. Multidetector CT imaging of the abdomen and pelvis was performed using the standard protocol during bolus administration of intravenous  contrast. RADIATION DOSE REDUCTION: This exam was performed according to the departmental dose-optimization program which includes automated exposure control, adjustment of the mA and/or kV according to patient size and/or use of iterative reconstruction technique. CONTRAST:  OMNIPAQUE IOHEXOL 350 MG/ML SOLN COMPARISON:  Chest radiographs 11/26/2023 and 11/19/2023. Abdominopelvic CT 11/19/2023 and 11/12/2023. FINDINGS: CTA CHEST FINDINGS Cardiovascular: The pulmonary arteries are well opacified with contrast to the level of the segmental branches. There is no evidence of acute pulmonary embolism. There is atherosclerosis of the aorta, great vessels and coronary arteries. The coronary artery involvement is severe. No acute systemic arterial abnormalities are identified. Probable calcifications of the aortic valve. The heart is mildly enlarged. No significant pericardial effusion. Mediastinum/Nodes: There are no enlarged mediastinal, hilar or axillary lymph nodes. Mild diffuse esophageal wall thickening. Lungs/Pleura: Small bilateral pleural effusions. No pneumothorax. Dependent opacities in both lung bases most likely represent atelectasis. No confluent airspace disease or suspicious pulmonary nodule. Musculoskeletal/Chest wall: No chest wall mass or suspicious osseous findings. CT ABDOMEN AND PELVIS FINDINGS Hepatobiliary: Morphologic changes of cirrhosis again noted with contour irregularity of the liver and relative enlargement of the caudate and left lobes. Early images demonstrate mildly heterogeneous enhancement, likely due to chronic passive congestion. No focal lesions are identified. No evidence of gallstones, gallbladder wall thickening or biliary dilatation. Pancreas: Unremarkable. No pancreatic ductal dilatation or surrounding inflammatory changes. Spleen: Normal in size without focal abnormality. Adrenals/Urinary Tract: Both adrenal glands appear normal. Chronic renal atrophy with poor parenchymal  enhancement and excretion bilaterally, consistent with chronic medical renal disease. No hydronephrosis or focal abnormality identified. The urinary bladder is decompressed. Stomach/Bowel: There may be a small amount of enteric contrast within the colon. The stomach appears unremarkable for its degree of distention. There is peripheral displacement of bowel by increasing complex ascites in the central abdomen, further described below. No evidence of bowel wall thickening, distension or focal surrounding inflammation. The appendix appears normal. Vascular/Lymphatic: Prominent lymph nodes in the porta hepatis and retroperitoneum, likely reactive. Mild aortic and branch vessel atherosclerosis without evidence of aneurysm or large vessel occlusion. The portal, splenic superior mesenteric veins are patent. Reproductive: The prostate gland and seminal vesicles appear unremarkable. Other: A large amount of ascites has increased in volume compared with the previous study. There is a large anterior component which now contains an air-level and multiple internal air bubbles as well as diffuse peritoneal enhancement, highly suspicious for peritonitis. Cannot exclude bowel perforation. Anterior loculated component measures up to 28.5 x 13.6 cm transverse and 25.4 cm craniocaudal. The fluid around the liver and in the pericolic gutters has not significantly increased in volume. Musculoskeletal: No acute or significant osseous findings. Scattered endplate changes in the spine, likely degenerative and/or related to chronic renal failure. Review of the MIP images confirms the above findings. IMPRESSION: 1. No evidence of acute pulmonary embolism or other acute vascular findings in the chest. 2. Small bilateral pleural effusions with dependent opacities in both lung bases, likely atelectasis. 3. Large amount of ascites has increased  in volume compared with the previous study. There is a large anterior component which now contains an  air-level and multiple internal air bubbles as well as diffuse peritoneal enhancement, highly suspicious for peritonitis. Cannot exclude bowel perforation. Correlate clinically. Repeat paracentesis and general surgical consultation may be helpful for evaluation. 4. Morphologic changes of cirrhosis with probable chronic passive congestion of the liver. No focal hepatic lesions identified. 5. Chronic renal atrophy with poor parenchymal enhancement and excretion bilaterally, consistent with chronic medical renal disease. 6.  Aortic Atherosclerosis (ICD10-I70.0). 7. These results were called by telephone at the time of interpretation on 11/26/2023 at 4:41 pm to Dr Alfred Levins, who verbally acknowledged these results. Electronically Signed   By: Carey Bullocks M.D.   On: 11/26/2023 16:45   CT Angio Chest PE W/Cm &/Or Wo Cm Result Date: 11/26/2023 CLINICAL DATA:  Pulmonary embolism suspected, high probability. Chest pain. Ground-glass emesis and abdominal distension. History of paracentesis. EXAM: CT ANGIOGRAPHY CHEST CT ABDOMEN AND PELVIS WITH CONTRAST TECHNIQUE: Multidetector CT imaging of the chest was performed using the standard protocol during bolus administration of intravenous contrast. Multiplanar CT image reconstructions and MIPs were obtained to evaluate the vascular anatomy. Multidetector CT imaging of the abdomen and pelvis was performed using the standard protocol during bolus administration of intravenous contrast. RADIATION DOSE REDUCTION: This exam was performed according to the departmental dose-optimization program which includes automated exposure control, adjustment of the mA and/or kV according to patient size and/or use of iterative reconstruction technique. CONTRAST:  OMNIPAQUE IOHEXOL 350 MG/ML SOLN COMPARISON:  Chest radiographs 11/26/2023 and 11/19/2023. Abdominopelvic CT 11/19/2023 and 11/12/2023. FINDINGS: CTA CHEST FINDINGS Cardiovascular: The pulmonary arteries are well opacified with  contrast to the level of the segmental branches. There is no evidence of acute pulmonary embolism. There is atherosclerosis of the aorta, great vessels and coronary arteries. The coronary artery involvement is severe. No acute systemic arterial abnormalities are identified. Probable calcifications of the aortic valve. The heart is mildly enlarged. No significant pericardial effusion. Mediastinum/Nodes: There are no enlarged mediastinal, hilar or axillary lymph nodes. Mild diffuse esophageal wall thickening. Lungs/Pleura: Small bilateral pleural effusions. No pneumothorax. Dependent opacities in both lung bases most likely represent atelectasis. No confluent airspace disease or suspicious pulmonary nodule. Musculoskeletal/Chest wall: No chest wall mass or suspicious osseous findings. CT ABDOMEN AND PELVIS FINDINGS Hepatobiliary: Morphologic changes of cirrhosis again noted with contour irregularity of the liver and relative enlargement of the caudate and left lobes. Early images demonstrate mildly heterogeneous enhancement, likely due to chronic passive congestion. No focal lesions are identified. No evidence of gallstones, gallbladder wall thickening or biliary dilatation. Pancreas: Unremarkable. No pancreatic ductal dilatation or surrounding inflammatory changes. Spleen: Normal in size without focal abnormality. Adrenals/Urinary Tract: Both adrenal glands appear normal. Chronic renal atrophy with poor parenchymal enhancement and excretion bilaterally, consistent with chronic medical renal disease. No hydronephrosis or focal abnormality identified. The urinary bladder is decompressed. Stomach/Bowel: There may be a small amount of enteric contrast within the colon. The stomach appears unremarkable for its degree of distention. There is peripheral displacement of bowel by increasing complex ascites in the central abdomen, further described below. No evidence of bowel wall thickening, distension or focal surrounding  inflammation. The appendix appears normal. Vascular/Lymphatic: Prominent lymph nodes in the porta hepatis and retroperitoneum, likely reactive. Mild aortic and branch vessel atherosclerosis without evidence of aneurysm or large vessel occlusion. The portal, splenic superior mesenteric veins are patent. Reproductive: The prostate gland and seminal vesicles appear unremarkable. Other:  A large amount of ascites has increased in volume compared with the previous study. There is a large anterior component which now contains an air-level and multiple internal air bubbles as well as diffuse peritoneal enhancement, highly suspicious for peritonitis. Cannot exclude bowel perforation. Anterior loculated component measures up to 28.5 x 13.6 cm transverse and 25.4 cm craniocaudal. The fluid around the liver and in the pericolic gutters has not significantly increased in volume. Musculoskeletal: No acute or significant osseous findings. Scattered endplate changes in the spine, likely degenerative and/or related to chronic renal failure. Review of the MIP images confirms the above findings. IMPRESSION: 1. No evidence of acute pulmonary embolism or other acute vascular findings in the chest. 2. Small bilateral pleural effusions with dependent opacities in both lung bases, likely atelectasis. 3. Large amount of ascites has increased in volume compared with the previous study. There is a large anterior component which now contains an air-level and multiple internal air bubbles as well as diffuse peritoneal enhancement, highly suspicious for peritonitis. Cannot exclude bowel perforation. Correlate clinically. Repeat paracentesis and general surgical consultation may be helpful for evaluation. 4. Morphologic changes of cirrhosis with probable chronic passive congestion of the liver. No focal hepatic lesions identified. 5. Chronic renal atrophy with poor parenchymal enhancement and excretion bilaterally, consistent with chronic medical  renal disease. 6.  Aortic Atherosclerosis (ICD10-I70.0). 7. These results were called by telephone at the time of interpretation on 11/26/2023 at 4:41 pm to Dr Alfred Levins, who verbally acknowledged these results. Electronically Signed   By: Carey Bullocks M.D.   On: 11/26/2023 16:45   DG Chest 2 View Result Date: 11/26/2023 CLINICAL DATA:  Substernal chest pain EXAM: CHEST - 2 VIEW COMPARISON:  Chest radiograph dated 11/19/2023 FINDINGS: Lines/tubes: Right internal jugular venous catheter tip projects over the right atrium. Lungs: Well inflated lungs. Bilateral interstitial and bibasilar patchy opacities. Pleura: No pneumothorax or pleural effusion. Heart/mediastinum: Similar mildly enlarged cardiomediastinal silhouette. Bones: No acute osseous abnormality. IMPRESSION: Bilateral interstitial and bibasilar patchy opacities, which may represent a combination of pulmonary edema and/or atelectasis, aspiration, or pneumonia. Electronically Signed   By: Agustin Cree M.D.   On: 11/26/2023 13:12               LOS: 1 day   Shannia Jacuinde  [facility]   Pager on [email or web address]. If 7PM-7AM, please contact night-coverage at [email or web address]     11/27/2023, 12:42 PM

## 2023-11-27 NOTE — ED Notes (Signed)
Pt in hall-bed talking on phone

## 2023-11-27 NOTE — ED Notes (Addendum)
Secure chat to Steward Drone, NP: [name] is a 63 y.o. 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 He came in on 2/13 for CP, vomiting, and abd pain. He had a paracentesis where they removed last night. He went for dialysis earlier today and according to the prior RN they did not remove any fluid. He has had a HR of 126-138 since I took over for him. He is a&ox4 has no complaints at this time. Any new orders?  2119: no new orders at this time.

## 2023-11-28 ENCOUNTER — Inpatient Hospital Stay
Admit: 2023-11-28 | Discharge: 2023-11-28 | Disposition: A | Discharge status: Home / Self Care | Payer: 59 | Attending: Internal Medicine | Admitting: Internal Medicine

## 2023-11-28 DIAGNOSIS — K652 Spontaneous bacterial peritonitis: Secondary | ICD-10-CM | POA: Diagnosis not present

## 2023-11-28 DIAGNOSIS — R011 Cardiac murmur, unspecified: Secondary | ICD-10-CM | POA: Diagnosis not present

## 2023-11-28 LAB — BASIC METABOLIC PANEL
Anion gap: 16 — ABNORMAL HIGH (ref 5–15)
BUN: 69 mg/dL — ABNORMAL HIGH (ref 8–23)
CO2: 20 mmol/L — ABNORMAL LOW (ref 22–32)
Calcium: 8.4 mg/dL — ABNORMAL LOW (ref 8.9–10.3)
Chloride: 101 mmol/L (ref 98–111)
Creatinine, Ser: 7.24 mg/dL — ABNORMAL HIGH (ref 0.61–1.24)
GFR, Estimated: 8 mL/min — ABNORMAL LOW (ref >60)
Glucose, Bld: 66 mg/dL — ABNORMAL LOW (ref 70–99)
Potassium: 4 mmol/L (ref 3.5–5.1)
Sodium: 137 mmol/L (ref 135–145)

## 2023-11-28 LAB — BLOOD CULTURE ID PANEL (REFLEXED) - BCID2

## 2023-11-28 LAB — ECHOCARDIOGRAM COMPLETE
AR max vel: 1.7 cm2
AV Peak grad: 11.8 mm[Hg]
Ao pk vel: 1.72 m/s
Area-P 1/2: 3.23 cm2
Calc EF: 37.4 %
Height: 72 in
S' Lateral: 3.8 cm
Single Plane A2C EF: 36.4 %
Single Plane A4C EF: 42.5 %
Weight: 2299.84 [oz_av]

## 2023-11-28 LAB — GLUCOSE, CAPILLARY
Glucose-Capillary: 68 mg/dL — ABNORMAL LOW (ref 70–99)
Glucose-Capillary: 70 mg/dL (ref 70–99)

## 2023-11-28 LAB — CBC
HCT: 25.4 % — ABNORMAL LOW (ref 39.0–52.0)
Hemoglobin: 9 g/dL — ABNORMAL LOW (ref 13.0–17.0)
MCH: 30.8 pg (ref 26.0–34.0)
MCHC: 35.4 g/dL (ref 30.0–36.0)
MCV: 87 fL (ref 80.0–100.0)
Platelets: 205 10*3/uL (ref 150–400)
RBC: 2.92 MIL/uL — ABNORMAL LOW (ref 4.22–5.81)
RDW: 15 % (ref 11.5–15.5)
WBC: 13.7 10*3/uL — ABNORMAL HIGH (ref 4.0–10.5)
nRBC: 0 % (ref 0.0–0.2)

## 2023-11-28 MED ORDER — PANTOPRAZOLE SODIUM 40 MG PO TBEC
40.0000 mg | DELAYED_RELEASE_TABLET | Freq: Every day | ORAL | Status: DC
  Administered 2023-11-28 – 2023-12-23 (×24): 40 mg via ORAL
  Filled 2023-11-28 (×25): qty 1

## 2023-11-28 MED ORDER — SODIUM CHLORIDE 0.9 % IV SOLN
1.0000 g | INTRAVENOUS | Status: DC
  Administered 2023-11-28 – 2023-12-04 (×7): 1 g via INTRAVENOUS
  Filled 2023-11-28 (×7): qty 10

## 2023-11-28 MED ORDER — NEPRO/CARBSTEADY PO LIQD
237.0000 mL | Freq: Two times a day (BID) | ORAL | Status: DC
  Administered 2023-11-28 – 2023-12-11 (×15): 237 mL via ORAL

## 2023-11-28 NOTE — Progress Notes (Signed)
Patient ID: Casey Franco, male   DOB: 11-16-1960, 63 y.o.   MRN: [id number]     SURGICAL PROGRESS NOTE   Hospital Day(s): 2.   Interval History: Patient seen and examined, no acute events or new complaints overnight. Patient reports feeling better.  He endorses improvement of the abdominal pain.  He denies any fever.  He denies any nausea or vomiting.  Vital signs in last 24 hours: [min-max] current  Temp:  [97.9 F (36.6 C)-98.9 F (37.2 C)] 98.7 F (37.1 C) (02/15 0813) Pulse Rate:  [94-135] 105 (02/15 0813) Resp:  [18-30] 20 (02/15 0813) BP: (141-168)/(86-112) 146/90 (02/15 0813) SpO2:  [93 %-99 %] 97 % (02/15 0813) Weight:  [65.2 kg] 65.2 kg (02/14 1630)     Height: 6' (182.9 cm) Weight: 65.2 kg BMI (Calculated): 19.49   Physical Exam:  Constitutional: alert, cooperative and no distress  Respiratory: breathing non-labored at rest  Cardiovascular: regular rate and sinus rhythm  Gastrointestinal: soft, non-tender, globose  Labs:     Latest Ref Rng & Units 11/28/2023    5:43 AM 11/27/2023   11:25 AM 11/27/2023    4:21 AM  CBC  WBC 4.0 - 10.5 K/uL 13.7  14.0  14.7   Hemoglobin 13.0 - 17.0 g/dL 9.0  9.4  9.7   Hematocrit 39.0 - 52.0 % 25.4  28.4  28.6   Platelets 150 - 400 K/uL 205  248  241       Latest Ref Rng & Units 11/28/2023    8:27 AM 11/27/2023    4:21 AM 11/26/2023   11:22 AM  CMP  Glucose 70 - 99 mg/dL 66  88  409   BUN 8 - 23 mg/dL 69  811  914   Creatinine 0.61 - 1.24 mg/dL 7.82  95.62  13.08   Sodium 135 - 145 mmol/L 137  141  142   Potassium 3.5 - 5.1 mmol/L 4.0  5.0  4.5   Chloride 98 - 111 mmol/L 101  102  100   CO2 22 - 32 mmol/L 20  17  18    Calcium 8.9 - 10.3 mg/dL 8.4  8.7  9.4   Total Protein 6.5 - 8.1 g/dL  6.7  7.5   Total Bilirubin 0.0 - 1.2 mg/dL  1.8  2.2   Alkaline Phos 38 - 126 U/L  70  96   AST 15 - 41 U/L  24  30   ALT 0 - 44 U/L  14  16     Imaging studies: No new pertinent imaging studies   Assessment/Plan:  63 y.o. male with  infected ascites status post paracentesis, complicated by pertinent comorbidities including cirrhosis, ESRD.  -There is no fever, stable vital signs -White blood cell count slowly in decreasing trend, stable hemoglobin -Abdominal exam without acute abdomen, no rebound tenderness, no guarding -No indication of any urgent surgery.  Patient high risk for surgery.  If there is any clinical deterioration, consider repeating CT scan to see if there is any localized collection that can be percutaneously drained instead of aspirated for source control -Continue IV antibiotic therapy -Follow-up paracentesis culture -No contraindication for diet from surgical standpoint -Will continue to follow  Gae Gallop, MD

## 2023-11-28 NOTE — Progress Notes (Signed)
[facility]  ROUNDING NOTE   Subjective:   Casey Franco is a 63 y.o. male with past medical history of HTN, ESRD, DM-2, and Hep C. Patient presents to emergency department with abdominal pain. He has been admitted for Intractable vomiting [R11.10] Abdominal pain [R10.9] Pneumonia of both lungs due to infectious organism, unspecified part of lung [J18.9] Sepsis without acute organ dysfunction, due to unspecified organism [facility]) [A41.9]  Patient is known to our practice and receives outpatient dialysis at [facility] on a MWF, last treatment on Tuesday.    Patient seen laying in bed Continues to have poor oral intake Denies abd pain   Objective:  Vital signs in last 24 hours:  Temp:  [98.1 F (36.7 C)-98.9 F (37.2 C)] 98.5 F (36.9 C) (02/15 1150) Pulse Rate:  [94-135] 119 (02/15 1150) Resp:  [16-28] 16 (02/15 1150) BP: (133-168)/(86-112) 133/88 (02/15 1150) SpO2:  [95 %-99 %] (P) 98 % (02/15 1154) Weight:  [65.2 kg] 65.2 kg (02/14 1630)  Weight change: -2.8 kg Filed Weights   11/26/23 1114 11/27/23 1241 11/27/23 1630  Weight: 68 kg 65.2 kg 65.2 kg    Intake/Output: No intake/output data recorded.   Intake/Output this shift:  No intake/output data recorded.  Physical Exam: General: NAD  Head: Normocephalic, atraumatic. Moist oral mucosal membranes  Eyes: Anicteric  Lungs:  Clear to auscultation, normal effort  Heart: Regular rate and rhythm  Abdomen:  Soft, nontender, mild distension  Extremities: Trace to 1+ peripheral edema.  Neurologic: Alert and oriented, moving all four extremities  Skin: No lesions  Access: Right chest tunneled catheter    Basic Metabolic Panel: Recent Labs  Lab 11/26/23 1122 11/27/23 0421 11/28/23 0827  NA 142 141 137  K 4.5 5.0 4.0  CL 100 102 101  CO2 18* 17* 20*  GLUCOSE 112* 88 66*  BUN 102* 116* 69*  CREATININE 11.92* 12.16* 7.24*  CALCIUM 9.4 8.7* 8.4*    Liver Function Tests: Recent Labs  Lab  11/26/23 1122 11/27/23 0421  AST 30 24  ALT 16 14  ALKPHOS 96 70  BILITOT 2.2* 1.8*  PROT 7.5 6.7  ALBUMIN 2.8* 2.4*   Recent Labs  Lab 11/26/23 1122  LIPASE 35   Recent Labs  Lab 11/26/23 1921  AMMONIA 67*    CBC: Recent Labs  Lab 11/26/23 1122 11/26/23 2344 11/27/23 0421 11/27/23 1125 11/28/23 0543  WBC 15.8* 15.8* 14.7* 14.0* 13.7*  NEUTROABS 13.4*  --   --   --   --   HGB 11.0* 9.9* 9.7* 9.4* 9.0*  HCT 33.1* 29.9* 28.6* 28.4* 25.4*  MCV 92.5 92.3 90.8 90.2 87.0  PLT 308 246 241 248 205    Cardiac Enzymes: No results for input(s): "CKTOTAL", "CKMB", "CKMBINDEX", "TROPONINI" in the last 168 hours.  BNP: Invalid input(s): "POCBNP"  CBG: Recent Labs  Lab 11/27/23 0757 11/28/23 0814 11/28/23 1153  GLUCAP 79 68* 70    Microbiology: Results for orders placed or performed during the hospital encounter of 11/26/23  Culture, blood (Routine x 2)     Status: None (Preliminary result)   Collection Time: 11/26/23 11:22 AM   Specimen: BLOOD  Result Value Ref Range Status   Specimen Description BLOOD RIGHT ANTECUBITAL  Final   Special Requests   Final    BOTTLES DRAWN AEROBIC AND ANAEROBIC Blood Culture results may not be optimal due to an inadequate volume of blood received in culture bottles   Culture   Final  NO GROWTH 2 DAYS Performed at [facility], [address] [place], [place] [address]    Report Status PENDING  Incomplete  Culture, blood (Routine x 2)     Status: Abnormal (Preliminary result)   Collection Time: 11/26/23 12:23 PM   Specimen: BLOOD LEFT ARM  Result Value Ref Range Status   Specimen Description   Final    BLOOD LEFT ARM Performed at [facility], [address], [place], [place] [address]    Special Requests   Final    BOTTLES DRAWN AEROBIC AND ANAEROBIC Blood Culture results may not be optimal due to an inadequate volume of blood received in culture bottles Performed at [facility], [address] [place], [place] [address]    Culture  Setup Time   Final    GRAM POSITIVE COCCI AEROBIC BOTTLE ONLY CRITICAL RESULT CALLED TO, READ BACK BY AND VERIFIED WITH: BRIANA ALLEY ON 11/27/23 AT 1107 QSD    Culture (A)  Final    STAPHYLOCOCCUS EPIDERMIDIS THE SIGNIFICANCE OF ISOLATING THIS ORGANISM FROM A SINGLE SET OF BLOOD CULTURES WHEN MULTIPLE SETS ARE DRAWN IS UNCERTAIN. PLEASE NOTIFY THE MICROBIOLOGY DEPARTMENT WITHIN ONE WEEK IF SPECIATION AND SENSITIVITIES ARE REQUIRED. Performed at [facility], [address], [place], [place] [address]    Report Status PENDING  Incomplete  Blood Culture ID Panel (Reflexed)     Status: Abnormal   Collection Time: 11/26/23 12:23 PM  Result Value Ref Range Status   Enterococcus faecalis NOT DETECTED NOT DETECTED Final   Enterococcus Faecium NOT DETECTED NOT DETECTED Final   Listeria monocytogenes NOT DETECTED NOT DETECTED Final   Staphylococcus species DETECTED (A) NOT DETECTED Final    Comment: CRITICAL RESULT CALLED TO, READ BACK BY AND VERIFIED WITH: BRIANA ALLEY ON 11/27/23 AT 1107 QSD    Staphylococcus aureus (BCID) NOT DETECTED NOT DETECTED Final   Staphylococcus epidermidis DETECTED (A) NOT DETECTED Final    Comment: CRITICAL RESULT CALLED TO, READ BACK BY AND VERIFIED WITH: BRIANA ALLEY ON 11/27/23 AT 1107 QSD    Staphylococcus lugdunensis NOT DETECTED NOT DETECTED Final   Streptococcus species NOT DETECTED NOT DETECTED Final   Streptococcus agalactiae NOT DETECTED NOT DETECTED Final   Streptococcus pneumoniae NOT DETECTED NOT DETECTED Final   Streptococcus pyogenes NOT DETECTED NOT DETECTED Final   A.calcoaceticus-baumannii NOT DETECTED NOT DETECTED Final   Bacteroides fragilis NOT DETECTED NOT DETECTED Final   Enterobacterales NOT DETECTED NOT DETECTED Final   Enterobacter cloacae complex NOT DETECTED NOT DETECTED Final   Escherichia coli NOT DETECTED NOT DETECTED Final   Klebsiella aerogenes NOT DETECTED NOT DETECTED Final    Klebsiella oxytoca NOT DETECTED NOT DETECTED Final   Klebsiella pneumoniae NOT DETECTED NOT DETECTED Final   Proteus species NOT DETECTED NOT DETECTED Final   Salmonella species NOT DETECTED NOT DETECTED Final   Serratia marcescens NOT DETECTED NOT DETECTED Final   Haemophilus influenzae NOT DETECTED NOT DETECTED Final   Neisseria meningitidis NOT DETECTED NOT DETECTED Final   Pseudomonas aeruginosa NOT DETECTED NOT DETECTED Final   Stenotrophomonas maltophilia NOT DETECTED NOT DETECTED Final   Candida albicans NOT DETECTED NOT DETECTED Final   Candida auris NOT DETECTED NOT DETECTED Final   Candida glabrata NOT DETECTED NOT DETECTED Final   Candida krusei NOT DETECTED NOT DETECTED Final   Candida parapsilosis NOT DETECTED NOT DETECTED Final   Candida tropicalis NOT DETECTED NOT DETECTED Final   Cryptococcus neoformans/gattii NOT DETECTED NOT DETECTED Final  Methicillin resistance mecA/C NOT DETECTED NOT DETECTED Final    Comment: Performed at [facility], [address] [place], [place] [address]  Body fluid culture w Gram Stain     Status: None (Preliminary result)   Collection Time: 11/26/23  8:55 PM   Specimen: PATH Cytology Peritoneal fluid  Result Value Ref Range Status   Specimen Description   Final    PERITONEAL CYTO Performed at [facility], [address], [place], [place] [address]    Special Requests   Final    NONE Performed at [facility], [address] [place], [place] [address]    Gram Stain   Final    FEW WBC PRESENT,BOTH PMN AND MONONUCLEAR FEW GRAM POSITIVE COCCI IN CHAINS MODERATE GRAM NEGATIVE RODS FEW GRAM POSITIVE RODS    Culture   Final    MODERATE STREPTOCOCCUS MITIS/ORALIS RARE CITROBACTER AMALONATICUS CULTURE REINCUBATED FOR BETTER GROWTH Performed at [facility], [address] [place], [place] [address]    Report Status PENDING  Incomplete  Culture, blood (Routine X 2) w Reflex to ID Panel     Status:  None (Preliminary result)   Collection Time: 11/28/23  5:43 AM   Specimen: BLOOD LEFT ARM  Result Value Ref Range Status   Specimen Description BLOOD LEFT ARM  Final   Special Requests   Final    BOTTLES DRAWN AEROBIC AND ANAEROBIC Blood Culture results may not be optimal due to an inadequate volume of blood received in culture bottles   Culture   Final    NO GROWTH <12 HOURS Performed at [facility], [address], [place], [place] [address]    Report Status PENDING  Incomplete  Culture, blood (Routine X 2) w Reflex to ID Panel     Status: None (Preliminary result)   Collection Time: 11/28/23  5:47 AM   Specimen: BLOOD RIGHT HAND  Result Value Ref Range Status   Specimen Description BLOOD RIGHT HAND  Final   Special Requests   Final    BOTTLES DRAWN AEROBIC AND ANAEROBIC Blood Culture results may not be optimal due to an inadequate volume of blood received in culture bottles   Culture   Final    NO GROWTH <12 HOURS Performed at [facility], [address] [place], [place] [address]    Report Status PENDING  Incomplete    Coagulation Studies: Recent Labs    11/26/23 1223  LABPROT 22.4*  INR 1.9*    Urinalysis: No results for input(s): "COLORURINE", "LABSPEC", "PHURINE", "GLUCOSEU", "HGBUR", "BILIRUBINUR", "KETONESUR", "PROTEINUR", "UROBILINOGEN", "NITRITE", "LEUKOCYTESUR" in the last 72 hours.  Invalid input(s): "APPERANCEUR"    Imaging: US Paracentesis Result Date: 11/27/2023 INDICATION: 63 year old with large volume ascites containing a large amount of air. Request for a diagnostic and therapeutic paracentesis. EXAM: ULTRASOUND GUIDED PARACENTESIS MEDICATIONS: None. COMPLICATIONS: None immediate. PROCEDURE: Informed written consent was obtained from the patient after a discussion of the risks, benefits and alternatives to treatment. A timeout was performed prior to the initiation of the procedure. Initial ultrasound scanning demonstrates a large  amount of complex ascites throughout the abdomen. Large pocket of fluid just below the umbilicus at the midline was targeted. This area was targeted because it was also associated with a large amount of peritoneal air. The periumbilical area was prepped with chlorhexidine and sterile field was created. Skin was anesthetized with 1% lidocaine. A small incision was made. Following this, a 6 Fr Safe-T-Centesis catheter was introduced. An  ultrasound image was saved for documentation purposes. The paracentesis was performed. The catheter was removed and a dressing was applied. The patient tolerated the procedure well without FINDINGS: A total of approximately 1.6 L of opaque yellow fluid was removed. Peritoneal catheter stopped draining after 1.6 L although there was still a large amount of intra-abdominal fluid. Catheter stopped draining likely due to the complexity and loculations within this fluid. Samples were sent to the laboratory as requested by the clinical team. IMPRESSION: Successful ultrasound-guided paracentesis yielding 1.6 liters of peritoneal fluid. Loculated ascites containing a large amount of air. Electronically Signed   By: Richarda Overlie M.D.   On: 11/27/2023 07:46   CT ABDOMEN PELVIS W CONTRAST Result Date: 11/26/2023 CLINICAL DATA:  Pulmonary embolism suspected, high probability. Chest pain. Ground-glass emesis and abdominal distension. History of paracentesis. EXAM: CT ANGIOGRAPHY CHEST CT ABDOMEN AND PELVIS WITH CONTRAST TECHNIQUE: Multidetector CT imaging of the chest was performed using the standard protocol during bolus administration of intravenous contrast. Multiplanar CT image reconstructions and MIPs were obtained to evaluate the vascular anatomy. Multidetector CT imaging of the abdomen and pelvis was performed using the standard protocol during bolus administration of intravenous contrast. RADIATION DOSE REDUCTION: This exam was performed according to the departmental dose-optimization  program which includes automated exposure control, adjustment of the mA and/or kV according to patient size and/or use of iterative reconstruction technique. CONTRAST:  OMNIPAQUE IOHEXOL 350 MG/ML SOLN COMPARISON:  Chest radiographs 11/26/2023 and 11/19/2023. Abdominopelvic CT 11/19/2023 and 11/12/2023. FINDINGS: CTA CHEST FINDINGS Cardiovascular: The pulmonary arteries are well opacified with contrast to the level of the segmental branches. There is no evidence of acute pulmonary embolism. There is atherosclerosis of the aorta, great vessels and coronary arteries. The coronary artery involvement is severe. No acute systemic arterial abnormalities are identified. Probable calcifications of the aortic valve. The heart is mildly enlarged. No significant pericardial effusion. Mediastinum/Nodes: There are no enlarged mediastinal, hilar or axillary lymph nodes. Mild diffuse esophageal wall thickening. Lungs/Pleura: Small bilateral pleural effusions. No pneumothorax. Dependent opacities in both lung bases most likely represent atelectasis. No confluent airspace disease or suspicious pulmonary nodule. Musculoskeletal/Chest wall: No chest wall mass or suspicious osseous findings. CT ABDOMEN AND PELVIS FINDINGS Hepatobiliary: Morphologic changes of cirrhosis again noted with contour irregularity of the liver and relative enlargement of the caudate and left lobes. Early images demonstrate mildly heterogeneous enhancement, likely due to chronic passive congestion. No focal lesions are identified. No evidence of gallstones, gallbladder wall thickening or biliary dilatation. Pancreas: Unremarkable. No pancreatic ductal dilatation or surrounding inflammatory changes. Spleen: Normal in size without focal abnormality. Adrenals/Urinary Tract: Both adrenal glands appear normal. Chronic renal atrophy with poor parenchymal enhancement and excretion bilaterally, consistent with chronic medical renal disease. No hydronephrosis or  focal abnormality identified. The urinary bladder is decompressed. Stomach/Bowel: There may be a small amount of enteric contrast within the colon. The stomach appears unremarkable for its degree of distention. There is peripheral displacement of bowel by increasing complex ascites in the central abdomen, further described below. No evidence of bowel wall thickening, distension or focal surrounding inflammation. The appendix appears normal. Vascular/Lymphatic: Prominent lymph nodes in the porta hepatis and retroperitoneum, likely reactive. Mild aortic and branch vessel atherosclerosis without evidence of aneurysm or large vessel occlusion. The portal, splenic superior mesenteric veins are patent. Reproductive: The prostate gland and seminal vesicles appear unremarkable. Other: A large amount of ascites has increased in volume compared with the previous study. There is a large anterior  component which now contains an air-level and multiple internal air bubbles as well as diffuse peritoneal enhancement, highly suspicious for peritonitis. Cannot exclude bowel perforation. Anterior loculated component measures up to 28.5 x 13.6 cm transverse and 25.4 cm craniocaudal. The fluid around the liver and in the pericolic gutters has not significantly increased in volume. Musculoskeletal: No acute or significant osseous findings. Scattered endplate changes in the spine, likely degenerative and/or related to chronic renal failure. Review of the MIP images confirms the above findings. IMPRESSION: 1. No evidence of acute pulmonary embolism or other acute vascular findings in the chest. 2. Small bilateral pleural effusions with dependent opacities in both lung bases, likely atelectasis. 3. Large amount of ascites has increased in volume compared with the previous study. There is a large anterior component which now contains an air-level and multiple internal air bubbles as well as diffuse peritoneal enhancement, highly suspicious  for peritonitis. Cannot exclude bowel perforation. Correlate clinically. Repeat paracentesis and general surgical consultation may be helpful for evaluation. 4. Morphologic changes of cirrhosis with probable chronic passive congestion of the liver. No focal hepatic lesions identified. 5. Chronic renal atrophy with poor parenchymal enhancement and excretion bilaterally, consistent with chronic medical renal disease. 6.  Aortic Atherosclerosis (ICD10-I70.0). 7. These results were called by telephone at the time of interpretation on 11/26/2023 at 4:41 pm to Dr Alfred Levins, who verbally acknowledged these results. Electronically Signed   By: Carey Bullocks M.D.   On: 11/26/2023 16:45   CT Angio Chest PE W/Cm &/Or Wo Cm Result Date: 11/26/2023 CLINICAL DATA:  Pulmonary embolism suspected, high probability. Chest pain. Ground-glass emesis and abdominal distension. History of paracentesis. EXAM: CT ANGIOGRAPHY CHEST CT ABDOMEN AND PELVIS WITH CONTRAST TECHNIQUE: Multidetector CT imaging of the chest was performed using the standard protocol during bolus administration of intravenous contrast. Multiplanar CT image reconstructions and MIPs were obtained to evaluate the vascular anatomy. Multidetector CT imaging of the abdomen and pelvis was performed using the standard protocol during bolus administration of intravenous contrast. RADIATION DOSE REDUCTION: This exam was performed according to the departmental dose-optimization program which includes automated exposure control, adjustment of the mA and/or kV according to patient size and/or use of iterative reconstruction technique. CONTRAST:  OMNIPAQUE IOHEXOL 350 MG/ML SOLN COMPARISON:  Chest radiographs 11/26/2023 and 11/19/2023. Abdominopelvic CT 11/19/2023 and 11/12/2023. FINDINGS: CTA CHEST FINDINGS Cardiovascular: The pulmonary arteries are well opacified with contrast to the level of the segmental branches. There is no evidence of acute pulmonary embolism. There is  atherosclerosis of the aorta, great vessels and coronary arteries. The coronary artery involvement is severe. No acute systemic arterial abnormalities are identified. Probable calcifications of the aortic valve. The heart is mildly enlarged. No significant pericardial effusion. Mediastinum/Nodes: There are no enlarged mediastinal, hilar or axillary lymph nodes. Mild diffuse esophageal wall thickening. Lungs/Pleura: Small bilateral pleural effusions. No pneumothorax. Dependent opacities in both lung bases most likely represent atelectasis. No confluent airspace disease or suspicious pulmonary nodule. Musculoskeletal/Chest wall: No chest wall mass or suspicious osseous findings. CT ABDOMEN AND PELVIS FINDINGS Hepatobiliary: Morphologic changes of cirrhosis again noted with contour irregularity of the liver and relative enlargement of the caudate and left lobes. Early images demonstrate mildly heterogeneous enhancement, likely due to chronic passive congestion. No focal lesions are identified. No evidence of gallstones, gallbladder wall thickening or biliary dilatation. Pancreas: Unremarkable. No pancreatic ductal dilatation or surrounding inflammatory changes. Spleen: Normal in size without focal abnormality. Adrenals/Urinary Tract: Both adrenal glands appear normal. Chronic renal atrophy  with poor parenchymal enhancement and excretion bilaterally, consistent with chronic medical renal disease. No hydronephrosis or focal abnormality identified. The urinary bladder is decompressed. Stomach/Bowel: There may be a small amount of enteric contrast within the colon. The stomach appears unremarkable for its degree of distention. There is peripheral displacement of bowel by increasing complex ascites in the central abdomen, further described below. No evidence of bowel wall thickening, distension or focal surrounding inflammation. The appendix appears normal. Vascular/Lymphatic: Prominent lymph nodes in the porta hepatis and  retroperitoneum, likely reactive. Mild aortic and branch vessel atherosclerosis without evidence of aneurysm or large vessel occlusion. The portal, splenic superior mesenteric veins are patent. Reproductive: The prostate gland and seminal vesicles appear unremarkable. Other: A large amount of ascites has increased in volume compared with the previous study. There is a large anterior component which now contains an air-level and multiple internal air bubbles as well as diffuse peritoneal enhancement, highly suspicious for peritonitis. Cannot exclude bowel perforation. Anterior loculated component measures up to 28.5 x 13.6 cm transverse and 25.4 cm craniocaudal. The fluid around the liver and in the pericolic gutters has not significantly increased in volume. Musculoskeletal: No acute or significant osseous findings. Scattered endplate changes in the spine, likely degenerative and/or related to chronic renal failure. Review of the MIP images confirms the above findings. IMPRESSION: 1. No evidence of acute pulmonary embolism or other acute vascular findings in the chest. 2. Small bilateral pleural effusions with dependent opacities in both lung bases, likely atelectasis. 3. Large amount of ascites has increased in volume compared with the previous study. There is a large anterior component which now contains an air-level and multiple internal air bubbles as well as diffuse peritoneal enhancement, highly suspicious for peritonitis. Cannot exclude bowel perforation. Correlate clinically. Repeat paracentesis and general surgical consultation may be helpful for evaluation. 4. Morphologic changes of cirrhosis with probable chronic passive congestion of the liver. No focal hepatic lesions identified. 5. Chronic renal atrophy with poor parenchymal enhancement and excretion bilaterally, consistent with chronic medical renal disease. 6.  Aortic Atherosclerosis (ICD10-I70.0). 7. These results were called by telephone at the  time of interpretation on 11/26/2023 at 4:41 pm to Dr Alfred Levins, who verbally acknowledged these results. Electronically Signed   By: Carey Bullocks M.D.   On: 11/26/2023 16:45     Medications:    cefTRIAXone (ROCEPHIN)  IV 2 g (11/28/23 1312)    amLODipine  10 mg Oral Daily   Chlorhexidine Gluconate Cloth  6 each Topical Q0600   feeding supplement (NEPRO CARB STEADY)  237 mL Oral BID BM   irbesartan  300 mg Oral Daily   lactulose  20 g Oral BID   pantoprazole  40 mg Oral Daily   tamsulosin  0.4 mg Oral Daily   acetaminophen, fentaNYL (SUBLIMAZE) injection, hydrALAZINE, ondansetron (ZOFRAN) IV, oxyCODONE  Assessment/ Plan:  Casey Franco is a 63 y.o.  male with HTN, ESRD, DM-2, Hep C.  Patient presents with chest pain and has been admitted for Intractable vomiting [R11.10] Abdominal pain [R10.9] Pneumonia of both lungs due to infectious organism, unspecified part of lung [J18.9] Sepsis without acute organ dysfunction, due to unspecified organism [facility]) [A41.9]  [facility]/MWF/right chest PermCath/71.0 kg  End-stage renal disease on hemodialysis.  Received dialysis yesterday, no UF. Next treatment scheduled for Monday.  2. Anemia of chronic kidney disease Lab Results  Component Value Date   HGB 9.0 (L) 11/28/2023    Hemoglobin within desired range.  Patient  does receive Mircera at outpatient clinic.  Will consider low-dose EPO with next treatment.  3. Secondary Hyperparathyroidism: with outpatient labs: PTH 351, phosphorus 6.6, calcium 8.1 on 11/23/2023.   Lab Results  Component Value Date   PTH 31 08/30/2018   CALCIUM 8.4 (L) 11/28/2023   PHOS 11.2 (H) 02/04/2021    Patient prescribed calcium acetate outpatient.Will obtain updated phosphorus with am labs  4.  Hypertension with chronic kidney disease.  Home regimen includes furosemide, hydralazine, amlodipine, and irbesartan.   LOS: 2 Jaylanni Eltringham 2/15/20252:22 PM

## 2023-11-28 NOTE — Consult Note (Signed)
PHARMACY - PHYSICIAN COMMUNICATION CRITICAL VALUE ALERT - BLOOD CULTURE IDENTIFICATION (BCID)  Casey Franco is an 63 y.o. male who presented to [facility] on 11/26/2023 with a chief complaint of SBP.  Assessment: 2 out of 4 bottles (same set) growing GNR. BCID did not detect an organism  Name of physician (or Provider) Contacted: Manuela Schwartz, NP  Current antibiotics: ceftriaxone  Changes to prescribed antibiotics recommended: Switch to cefepime Recommendations accepted by provider  Results for orders placed or performed during the hospital encounter of 11/26/23  Blood Culture ID Panel (Reflexed) (Collected: 11/26/2023 11:22 AM)  Result Value Ref Range   Enterococcus faecalis NOT DETECTED NOT DETECTED   Enterococcus Faecium NOT DETECTED NOT DETECTED   Listeria monocytogenes NOT DETECTED NOT DETECTED   Staphylococcus species NOT DETECTED NOT DETECTED   Staphylococcus aureus (BCID) NOT DETECTED NOT DETECTED   Staphylococcus epidermidis NOT DETECTED NOT DETECTED   Staphylococcus lugdunensis NOT DETECTED NOT DETECTED   Streptococcus species NOT DETECTED NOT DETECTED   Streptococcus agalactiae NOT DETECTED NOT DETECTED   Streptococcus pneumoniae NOT DETECTED NOT DETECTED   Streptococcus pyogenes NOT DETECTED NOT DETECTED   A.calcoaceticus-baumannii NOT DETECTED NOT DETECTED   Bacteroides fragilis NOT DETECTED NOT DETECTED   Enterobacterales NOT DETECTED NOT DETECTED   Enterobacter cloacae complex NOT DETECTED NOT DETECTED   Escherichia coli NOT DETECTED NOT DETECTED   Klebsiella aerogenes NOT DETECTED NOT DETECTED   Klebsiella oxytoca NOT DETECTED NOT DETECTED   Klebsiella pneumoniae NOT DETECTED NOT DETECTED   Proteus species NOT DETECTED NOT DETECTED   Salmonella species NOT DETECTED NOT DETECTED   Serratia marcescens NOT DETECTED NOT DETECTED   Haemophilus influenzae NOT DETECTED NOT DETECTED   Neisseria meningitidis NOT DETECTED NOT DETECTED   Pseudomonas aeruginosa NOT  DETECTED NOT DETECTED   Stenotrophomonas maltophilia NOT DETECTED NOT DETECTED   Candida albicans NOT DETECTED NOT DETECTED   Candida auris NOT DETECTED NOT DETECTED   Candida glabrata NOT DETECTED NOT DETECTED   Candida krusei NOT DETECTED NOT DETECTED   Candida parapsilosis NOT DETECTED NOT DETECTED   Candida tropicalis NOT DETECTED NOT DETECTED   Cryptococcus neoformans/gattii NOT DETECTED NOT DETECTED    Celene Squibb, PharmD Clinical Pharmacist 11/28/2023 9:17 PM

## 2023-11-28 NOTE — Progress Notes (Signed)
  Echocardiogram 2D Echocardiogram has been performed.  Casey Franco 11/28/2023, 12:25 PM

## 2023-11-28 NOTE — Plan of Care (Signed)

## 2023-11-28 NOTE — Progress Notes (Addendum)
Progress Note    Casey Franco  IHK:[id number] DOB: 03/27/1961  DOA: 11/26/2023 PCP: [facility]      Brief Narrative:    Medical records reviewed and are as summarized below:  Casey Franco is a 63 y.o. 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  He also complained of nausea, increasing abdominal distention and multiple episodes of vomiting.      Assessment/Plan:   Principal Problem:   Spontaneous bacterial peritonitis (HCC) Active Problems:   Abdominal pain   Cirrhosis of liver with ascites (HCC)   Myocardial injury   Essential hypertension   ESRD (end stage renal disease) (HCC)   Coffee ground emesis    Body mass index is 19.49 kg/m.   Spontaneous bacterial peritonitis, abdominal pain, liver cirrhosis with ascites: S/p paracentesis with removal of 1600 mL of fluid on 11/26/2023.  Fluid positive for SBP (ascitic fluid total nucleated cells 1,326, percentage neutrophils was 68% and absolute PMN count 901) Ascitic fluid culture is pending.  Continue IV ceftriaxone. No indication for surgery at this time.  General surgeon is on board. Staph epidermidis bacteremia: 1 out of 4 bottles from 11/26/2023 positive for Staph epidermidis.  This is likely a contaminant.  Repeat blood cultures from 11/27/2023 has not shown any growth thus far.   Hematemesis: Resolved.  This is probably from multiple episodes of vomiting.  Improved.  H&H is stable.   Elevated troponins: Troponin is 172, 138, 127 and 128.  This is likely from demand ischemia. Tachycardia, PVCs on telemetry, cardiac murmur loudest in mitral area: EKG showed atrial fibrillation with RVR.  He reverted spontaneously to sinus tachycardia.  Continue to monitor. 2D echo is pending   ESRD on hemodialysis:  Follow-up with nephrologist for hemodialysis.   Dysphagia: Speech therapist recommended dysphagia 2 diet, thin liquids.    Comorbidities include hypertension, hyperlipidemia, BPH   Prognosis is guarded  Follow-up with palliative care team.    Diet Order             DIET DYS 2 Fluid consistency: Thin  Diet effective now                            Consultants: General surgeon Interventional radiologist  Procedures: Paracentesis with removal of 1,600 mL of fluid on 11/26/2023    Medications:    amLODipine  10 mg Oral Daily   Chlorhexidine Gluconate Cloth  6 each Topical Q0600   feeding supplement (NEPRO CARB STEADY)  237 mL Oral BID BM   irbesartan  300 mg Oral Daily   lactulose  20 g Oral BID   pantoprazole  40 mg Oral Daily   tamsulosin  0.4 mg Oral Daily   Continuous Infusions:  cefTRIAXone (ROCEPHIN)  IV 2 g (11/28/23 1312)     Anti-infectives (From admission, onward)    Start     Dose/Rate Route Frequency Ordered Stop   11/27/23 1300  cefTRIAXone (ROCEPHIN) 2 g in sodium chloride 0.9 % 100 mL IVPB        2 g 200 mL/hr over 30 Minutes Intravenous Every 24 hours 11/26/23 2027     11/27/23 1200  vancomycin (VANCOREADY) IVPB 1500 mg/300 mL  Status:  Discontinued        1,500 mg 150  mL/hr over 120 Minutes Intravenous  Once 11/27/23 1131 11/27/23 1223   11/26/23 1345  cefTRIAXone (ROCEPHIN) 2 g in sodium chloride 0.9 % 100 mL IVPB        2 g 200 mL/hr over 30 Minutes Intravenous Once 11/26/23 1333 11/26/23 1432   11/26/23 1345  azithromycin (ZITHROMAX) 500 mg in sodium chloride 0.9 % 250 mL IVPB        500 mg 250 mL/hr over 60 Minutes Intravenous  Once 11/26/23 1333 11/26/23 1633              Family Communication/Anticipated D/C date and plan/Code Status   DVT prophylaxis: SCDs Start: 11/26/23 2029     Code Status: Full Code  Family Communication: None Disposition Plan: Plan to discharge home   Status is: Inpatient Remains  inpatient appropriate because: Spontaneous bacterial peritonitis       Subjective:   Interval events noted.  Abdominal pain is better today.  No palpitations, shortness of breath or chest pain.  Objective:    Vitals:   11/28/23 0413 11/28/23 0813 11/28/23 1150 11/28/23 1154  BP: (!) 141/88 (!) 146/90 133/88   Pulse: 97 (!) 105 (!) 119   Resp: 19 20 16    Temp: 98.7 F (37.1 C) 98.7 F (37.1 C) 98.5 F (36.9 C)   TempSrc: Oral Oral Oral   SpO2: 97% 97%  (P) 98%  Weight:      Height:       No data found.   Intake/Output Summary (Last 24 hours) at 11/28/2023 1358 Last data filed at 11/28/2023 0900 Gross per 24 hour  Intake 0 ml  Output 0 ml  Net 0 ml   Filed Weights   11/26/23 1114 11/27/23 1241 11/27/23 1630  Weight: 68 kg 65.2 kg 65.2 kg    Exam:  GEN: NAD SKIN: Warm and dry EYES: No pallor or icterus ENT: MMM CV: RRR, tachycardic PULM: CTA B ABD: soft, distended, less tender, +BS CNS: AAO x 3, non focal EXT: No edema or tenderness      Data Reviewed:   I have personally reviewed following labs and imaging studies:  Labs: Labs show the following:   Basic Metabolic Panel: Recent Labs  Lab 11/26/23 1122 11/27/23 0421 11/28/23 0827  NA 142 141 137  K 4.5 5.0 4.0  CL 100 102 101  CO2 18* 17* 20*  GLUCOSE 112* 88 66*  BUN 102* 116* 69*  CREATININE 11.92* 12.16* 7.24*  CALCIUM 9.4 8.7* 8.4*   GFR Estimated Creatinine Clearance: 9.6 mL/min (A) (by C-G formula based on SCr of 7.24 mg/dL (H)). Liver Function Tests: Recent Labs  Lab 11/26/23 1122 11/27/23 0421  AST 30 24  ALT 16 14  ALKPHOS 96 70  BILITOT 2.2* 1.8*  PROT 7.5 6.7  ALBUMIN 2.8* 2.4*   Recent Labs  Lab 11/26/23 1122  LIPASE 35   Recent Labs  Lab 11/26/23 1921  AMMONIA 67*   Coagulation profile Recent Labs  Lab 11/26/23 1223  INR 1.9*    CBC: Recent Labs  Lab 11/26/23 1122 11/26/23 2344 11/27/23 0421 11/27/23 1125 11/28/23 0543  WBC 15.8* 15.8* 14.7*  14.0* 13.7*  NEUTROABS 13.4*  --   --   --   --   HGB 11.0* 9.9* 9.7* 9.4* 9.0*  HCT 33.1* 29.9* 28.6* 28.4* 25.4*  MCV 92.5 92.3 90.8 90.2 87.0  PLT 308 246 241 248 205   Cardiac Enzymes: No results for input(s): "CKTOTAL", "CKMB", "CKMBINDEX", "TROPONINI" in the last  168 hours. BNP (last 3 results) No results for input(s): "PROBNP" in the last 8760 hours. CBG: Recent Labs  Lab 11/27/23 0757 11/28/23 0814 11/28/23 1153  GLUCAP 79 68* 70   D-Dimer: No results for input(s): "DDIMER" in the last 72 hours. Hgb A1c: Recent Labs    11/26/23 1122  HGBA1C 5.2   Lipid Profile: Recent Labs    11/27/23 0421  CHOL 67  HDL <10*  LDLCALC NOT CALCULATED  TRIG 104  CHOLHDL NOT CALCULATED   Thyroid function studies: No results for input(s): "TSH", "T4TOTAL", "T3FREE", "THYROIDAB" in the last 72 hours.  Invalid input(s): "FREET3" Anemia work up: No results for input(s): "VITAMINB12", "FOLATE", "FERRITIN", "TIBC", "IRON", "RETICCTPCT" in the last 72 hours. Sepsis Labs: Recent Labs  Lab 11/26/23 1122 11/26/23 2344 11/27/23 0421 11/27/23 1125 11/28/23 0543  WBC 15.8* 15.8* 14.7* 14.0* 13.7*  LATICACIDVEN 1.7  --   --   --   --     Microbiology Recent Results (from the past 240 hours)  Resp panel by RT-PCR (RSV, Flu A&B, Covid) Anterior Nasal Swab     Status: None   Collection Time: 11/19/23  9:11 AM   Specimen: Anterior Nasal Swab  Result Value Ref Range Status   SARS Coronavirus 2 by RT PCR NEGATIVE NEGATIVE Final    Comment: (NOTE) SARS-CoV-2 target nucleic acids are NOT DETECTED.  The SARS-CoV-2 RNA is generally detectable in upper respiratory specimens during the acute phase of infection. The lowest concentration of SARS-CoV-2 viral copies this assay can detect is 138 copies/mL. A negative result does not preclude SARS-Cov-2 infection and should not be used as the sole basis for treatment or other patient management decisions. A negative result may occur with   improper specimen collection/handling, submission of specimen other than nasopharyngeal swab, presence of viral mutation(s) within the areas targeted by this assay, and inadequate number of viral copies(<138 copies/mL). A negative result must be combined with clinical observations, patient history, and epidemiological information. The expected result is Negative.  Fact Sheet for Patients:  BloggerCourse.com  Fact Sheet for Healthcare Providers:  SeriousBroker.it  This test is no t yet approved or cleared by the [place] FDA and  has been authorized for detection and/or diagnosis of SARS-CoV-2 by FDA under an Emergency Use Authorization (EUA). This EUA will remain  in effect (meaning this test can be used) for the duration of the COVID-19 declaration under Section 564(b)(1) of the Act, 21 U.S.C.section 360bbb-3(b)(1), unless the authorization is terminated  or revoked sooner.       Influenza A by PCR NEGATIVE NEGATIVE Final   Influenza B by PCR NEGATIVE NEGATIVE Final    Comment: (NOTE) The Xpert Xpress SARS-CoV-2/FLU/RSV plus assay is intended as an aid in the diagnosis of influenza from Nasopharyngeal swab specimens and should not be used as a sole basis for treatment. Nasal washings and aspirates are unacceptable for Xpert Xpress SARS-CoV-2/FLU/RSV testing.  Fact Sheet for Patients: BloggerCourse.com  Fact Sheet for Healthcare Providers: SeriousBroker.it  This test is not yet approved or cleared by the [place] FDA and has been authorized for detection and/or diagnosis of SARS-CoV-2 by FDA under an Emergency Use Authorization (EUA). This EUA will remain in effect (meaning this test can be used) for the duration of the COVID-19 declaration under Section 564(b)(1) of the Act, 21 U.S.C. section 360bbb-3(b)(1), unless the authorization is terminated or revoked.      Resp Syncytial Virus by PCR NEGATIVE NEGATIVE Final    Comment: (NOTE)  Fact Sheet for Patients: BloggerCourse.com  Fact Sheet for Healthcare Providers: SeriousBroker.it  This test is not yet approved or cleared by the [place] FDA and has been authorized for detection and/or diagnosis of SARS-CoV-2 by FDA under an Emergency Use Authorization (EUA). This EUA will remain in effect (meaning this test can be used) for the duration of the COVID-19 declaration under Section 564(b)(1) of the Act, 21 U.S.C. section 360bbb-3(b)(1), unless the authorization is terminated or revoked.  Performed at [facility], [address] [place], [place] [address]   Body fluid culture w Gram Stain     Status: None   Collection Time: 11/24/23  9:43 AM   Specimen: PATH Cytology Peritoneal fluid  Result Value Ref Range Status   Specimen Description   Final    PERITONEAL Performed at [facility], [address] [place], [place] [address]    Special Requests   Final    NONE Performed at [facility], [address] [place], [place] [address]    Gram Stain NO WBC SEEN NO ORGANISMS SEEN   Final   Culture   Final    NO GROWTH 3 DAYS Performed at [facility], [address], [place], [place] [address]    Report Status 11/27/2023 FINAL  Final  Culture, blood (Routine x 2)     Status: None (Preliminary result)   Collection Time: 11/26/23 11:22 AM   Specimen: BLOOD  Result Value Ref Range Status   Specimen Description BLOOD RIGHT ANTECUBITAL  Final   Special Requests   Final    BOTTLES DRAWN AEROBIC AND ANAEROBIC Blood Culture results may not be optimal due to an inadequate volume of blood received in culture bottles   Culture   Final    NO GROWTH 2 DAYS Performed at [facility], [address], [place], [place] [address]    Report Status PENDING  Incomplete  Culture, blood (Routine x 2)      Status: Abnormal (Preliminary result)   Collection Time: 11/26/23 12:23 PM   Specimen: BLOOD LEFT ARM  Result Value Ref Range Status   Specimen Description   Final    BLOOD LEFT ARM Performed at [facility], [address] [place], [place] [address]    Special Requests   Final    BOTTLES DRAWN AEROBIC AND ANAEROBIC Blood Culture results may not be optimal due to an inadequate volume of blood received in culture bottles Performed at [facility], [address] [place], [place] [address]    Culture  Setup Time   Final    GRAM POSITIVE COCCI AEROBIC BOTTLE ONLY CRITICAL RESULT CALLED TO, READ BACK BY AND VERIFIED WITH: BRIANA ALLEY ON 11/27/23 AT 1107 QSD    Culture (A)  Final    STAPHYLOCOCCUS EPIDERMIDIS THE SIGNIFICANCE OF ISOLATING THIS ORGANISM FROM A SINGLE SET OF BLOOD CULTURES WHEN MULTIPLE SETS ARE DRAWN IS UNCERTAIN. PLEASE NOTIFY THE MICROBIOLOGY DEPARTMENT WITHIN ONE WEEK IF SPECIATION AND SENSITIVITIES ARE REQUIRED. Performed at [facility], [address] [place], [place] [address]    Report Status PENDING  Incomplete  Blood Culture ID Panel (Reflexed)     Status: Abnormal   Collection Time: 11/26/23 12:23 PM  Result Value Ref Range Status   Enterococcus faecalis NOT DETECTED NOT DETECTED Final   Enterococcus Faecium NOT DETECTED NOT DETECTED Final   Listeria monocytogenes NOT DETECTED NOT DETECTED Final   Staphylococcus species DETECTED (A) NOT DETECTED Final  Comment: CRITICAL RESULT CALLED TO, READ BACK BY AND VERIFIED WITH: BRIANA ALLEY ON 11/27/23 AT 1107 QSD    Staphylococcus aureus (BCID) NOT DETECTED NOT DETECTED Final   Staphylococcus epidermidis DETECTED (A) NOT DETECTED Final    Comment: CRITICAL RESULT CALLED TO, READ BACK BY AND VERIFIED WITH: BRIANA ALLEY ON 11/27/23 AT 1107 QSD    Staphylococcus lugdunensis NOT DETECTED NOT DETECTED Final   Streptococcus species NOT DETECTED NOT DETECTED Final   Streptococcus agalactiae NOT  DETECTED NOT DETECTED Final   Streptococcus pneumoniae NOT DETECTED NOT DETECTED Final   Streptococcus pyogenes NOT DETECTED NOT DETECTED Final   A.calcoaceticus-baumannii NOT DETECTED NOT DETECTED Final   Bacteroides fragilis NOT DETECTED NOT DETECTED Final   Enterobacterales NOT DETECTED NOT DETECTED Final   Enterobacter cloacae complex NOT DETECTED NOT DETECTED Final   Escherichia coli NOT DETECTED NOT DETECTED Final   Klebsiella aerogenes NOT DETECTED NOT DETECTED Final   Klebsiella oxytoca NOT DETECTED NOT DETECTED Final   Klebsiella pneumoniae NOT DETECTED NOT DETECTED Final   Proteus species NOT DETECTED NOT DETECTED Final   Salmonella species NOT DETECTED NOT DETECTED Final   Serratia marcescens NOT DETECTED NOT DETECTED Final   Haemophilus influenzae NOT DETECTED NOT DETECTED Final   Neisseria meningitidis NOT DETECTED NOT DETECTED Final   Pseudomonas aeruginosa NOT DETECTED NOT DETECTED Final   Stenotrophomonas maltophilia NOT DETECTED NOT DETECTED Final   Candida albicans NOT DETECTED NOT DETECTED Final   Candida auris NOT DETECTED NOT DETECTED Final   Candida glabrata NOT DETECTED NOT DETECTED Final   Candida krusei NOT DETECTED NOT DETECTED Final   Candida parapsilosis NOT DETECTED NOT DETECTED Final   Candida tropicalis NOT DETECTED NOT DETECTED Final   Cryptococcus neoformans/gattii NOT DETECTED NOT DETECTED Final   Methicillin resistance mecA/C NOT DETECTED NOT DETECTED Final    Comment: Performed at [facility], [address] [place], [place] [address]  Body fluid culture w Gram Stain     Status: None (Preliminary result)   Collection Time: 11/26/23  8:55 PM   Specimen: PATH Cytology Peritoneal fluid  Result Value Ref Range Status   Specimen Description   Final    PERITONEAL CYTO Performed at [facility], [address], [place], [place] [address]    Special Requests   Final    NONE Performed at [facility], [address] [place], [place] [address]    Gram Stain   Final    FEW WBC PRESENT,BOTH PMN AND MONONUCLEAR FEW GRAM POSITIVE COCCI IN CHAINS MODERATE GRAM NEGATIVE RODS FEW GRAM POSITIVE RODS    Culture   Final    MODERATE STREPTOCOCCUS MITIS/ORALIS RARE CITROBACTER AMALONATICUS CULTURE REINCUBATED FOR BETTER GROWTH Performed at [facility], [address], [place], [place] [address]    Report Status PENDING  Incomplete  Culture, blood (Routine X 2) w Reflex to ID Panel     Status: None (Preliminary result)   Collection Time: 11/28/23  5:43 AM   Specimen: BLOOD LEFT ARM  Result Value Ref Range Status   Specimen Description BLOOD LEFT ARM  Final   Special Requests   Final    BOTTLES DRAWN AEROBIC AND ANAEROBIC Blood Culture results may not be optimal due to an inadequate volume of blood received in culture bottles   Culture   Final    NO GROWTH <12 HOURS Performed at [facility], [address], [place], [place] [address]    Report Status PENDING  Incomplete  Culture, blood (Routine X 2) w Reflex to ID Panel     Status: None (Preliminary result)   Collection Time: 11/28/23  5:47 AM   Specimen: BLOOD RIGHT HAND  Result Value Ref Range Status   Specimen Description BLOOD RIGHT HAND  Final   Special Requests   Final    BOTTLES DRAWN AEROBIC AND ANAEROBIC Blood Culture results may not be optimal due to an inadequate volume of blood received in culture bottles   Culture   Final    NO GROWTH <12 HOURS Performed at [facility], [address], [place], [place] [address]    Report Status PENDING  Incomplete    Procedures and diagnostic studies:  US Paracentesis Result Date: 11/27/2023 INDICATION: 63 year old with large volume ascites containing a large amount of air. Request for a diagnostic and therapeutic paracentesis. EXAM: ULTRASOUND GUIDED PARACENTESIS MEDICATIONS: None. COMPLICATIONS: None immediate. PROCEDURE: Informed written consent was obtained from the  patient after a discussion of the risks, benefits and alternatives to treatment. A timeout was performed prior to the initiation of the procedure. Initial ultrasound scanning demonstrates a large amount of complex ascites throughout the abdomen. Large pocket of fluid just below the umbilicus at the midline was targeted. This area was targeted because it was also associated with a large amount of peritoneal air. The periumbilical area was prepped with chlorhexidine and sterile field was created. Skin was anesthetized with 1% lidocaine. A small incision was made. Following this, a 6 Fr Safe-T-Centesis catheter was introduced. An ultrasound image was saved for documentation purposes. The paracentesis was performed. The catheter was removed and a dressing was applied. The patient tolerated the procedure well without FINDINGS: A total of approximately 1.6 L of opaque yellow fluid was removed. Peritoneal catheter stopped draining after 1.6 L although there was still a large amount of intra-abdominal fluid. Catheter stopped draining likely due to the complexity and loculations within this fluid. Samples were sent to the laboratory as requested by the clinical team. IMPRESSION: Successful ultrasound-guided paracentesis yielding 1.6 liters of peritoneal fluid. Loculated ascites containing a large amount of air. Electronically Signed   By: Richarda Overlie M.D.   On: 11/27/2023 07:46   CT ABDOMEN PELVIS W CONTRAST Result Date: 11/26/2023 CLINICAL DATA:  Pulmonary embolism suspected, high probability. Chest pain. Ground-glass emesis and abdominal distension. History of paracentesis. EXAM: CT ANGIOGRAPHY CHEST CT ABDOMEN AND PELVIS WITH CONTRAST TECHNIQUE: Multidetector CT imaging of the chest was performed using the standard protocol during bolus administration of intravenous contrast. Multiplanar CT image reconstructions and MIPs were obtained to evaluate the vascular anatomy. Multidetector CT imaging of the abdomen and pelvis  was performed using the standard protocol during bolus administration of intravenous contrast. RADIATION DOSE REDUCTION: This exam was performed according to the departmental dose-optimization program which includes automated exposure control, adjustment of the mA and/or kV according to patient size and/or use of iterative reconstruction technique. CONTRAST:  OMNIPAQUE IOHEXOL 350 MG/ML SOLN COMPARISON:  Chest radiographs 11/26/2023 and 11/19/2023. Abdominopelvic CT 11/19/2023 and 11/12/2023. FINDINGS: CTA CHEST FINDINGS Cardiovascular: The pulmonary arteries are well opacified with contrast to the level of the segmental branches. There is no evidence of acute pulmonary embolism. There is atherosclerosis of the aorta, great vessels and coronary arteries. The coronary artery involvement is severe. No acute systemic arterial abnormalities are identified. Probable calcifications of the aortic valve. The heart is mildly enlarged. No significant pericardial effusion. Mediastinum/Nodes: There are no enlarged mediastinal, hilar or axillary lymph nodes.  Mild diffuse esophageal wall thickening. Lungs/Pleura: Small bilateral pleural effusions. No pneumothorax. Dependent opacities in both lung bases most likely represent atelectasis. No confluent airspace disease or suspicious pulmonary nodule. Musculoskeletal/Chest wall: No chest wall mass or suspicious osseous findings. CT ABDOMEN AND PELVIS FINDINGS Hepatobiliary: Morphologic changes of cirrhosis again noted with contour irregularity of the liver and relative enlargement of the caudate and left lobes. Early images demonstrate mildly heterogeneous enhancement, likely due to chronic passive congestion. No focal lesions are identified. No evidence of gallstones, gallbladder wall thickening or biliary dilatation. Pancreas: Unremarkable. No pancreatic ductal dilatation or surrounding inflammatory changes. Spleen: Normal in size without focal abnormality. Adrenals/Urinary  Tract: Both adrenal glands appear normal. Chronic renal atrophy with poor parenchymal enhancement and excretion bilaterally, consistent with chronic medical renal disease. No hydronephrosis or focal abnormality identified. The urinary bladder is decompressed. Stomach/Bowel: There may be a small amount of enteric contrast within the colon. The stomach appears unremarkable for its degree of distention. There is peripheral displacement of bowel by increasing complex ascites in the central abdomen, further described below. No evidence of bowel wall thickening, distension or focal surrounding inflammation. The appendix appears normal. Vascular/Lymphatic: Prominent lymph nodes in the porta hepatis and retroperitoneum, likely reactive. Mild aortic and branch vessel atherosclerosis without evidence of aneurysm or large vessel occlusion. The portal, splenic superior mesenteric veins are patent. Reproductive: The prostate gland and seminal vesicles appear unremarkable. Other: A large amount of ascites has increased in volume compared with the previous study. There is a large anterior component which now contains an air-level and multiple internal air bubbles as well as diffuse peritoneal enhancement, highly suspicious for peritonitis. Cannot exclude bowel perforation. Anterior loculated component measures up to 28.5 x 13.6 cm transverse and 25.4 cm craniocaudal. The fluid around the liver and in the pericolic gutters has not significantly increased in volume. Musculoskeletal: No acute or significant osseous findings. Scattered endplate changes in the spine, likely degenerative and/or related to chronic renal failure. Review of the MIP images confirms the above findings. IMPRESSION: 1. No evidence of acute pulmonary embolism or other acute vascular findings in the chest. 2. Small bilateral pleural effusions with dependent opacities in both lung bases, likely atelectasis. 3. Large amount of ascites has increased in volume  compared with the previous study. There is a large anterior component which now contains an air-level and multiple internal air bubbles as well as diffuse peritoneal enhancement, highly suspicious for peritonitis. Cannot exclude bowel perforation. Correlate clinically. Repeat paracentesis and general surgical consultation may be helpful for evaluation. 4. Morphologic changes of cirrhosis with probable chronic passive congestion of the liver. No focal hepatic lesions identified. 5. Chronic renal atrophy with poor parenchymal enhancement and excretion bilaterally, consistent with chronic medical renal disease. 6.  Aortic Atherosclerosis (ICD10-I70.0). 7. These results were called by telephone at the time of interpretation on 11/26/2023 at 4:41 pm to Dr Alfred Levins, who verbally acknowledged these results. Electronically Signed   By: Carey Bullocks M.D.   On: 11/26/2023 16:45   CT Angio Chest PE W/Cm &/Or Wo Cm Result Date: 11/26/2023 CLINICAL DATA:  Pulmonary embolism suspected, high probability. Chest pain. Ground-glass emesis and abdominal distension. History of paracentesis. EXAM: CT ANGIOGRAPHY CHEST CT ABDOMEN AND PELVIS WITH CONTRAST TECHNIQUE: Multidetector CT imaging of the chest was performed using the standard protocol during bolus administration of intravenous contrast. Multiplanar CT image reconstructions and MIPs were obtained to evaluate the vascular anatomy. Multidetector CT imaging of the abdomen and pelvis was performed using  the standard protocol during bolus administration of intravenous contrast. RADIATION DOSE REDUCTION: This exam was performed according to the departmental dose-optimization program which includes automated exposure control, adjustment of the mA and/or kV according to patient size and/or use of iterative reconstruction technique. CONTRAST:  OMNIPAQUE IOHEXOL 350 MG/ML SOLN COMPARISON:  Chest radiographs 11/26/2023 and 11/19/2023. Abdominopelvic CT 11/19/2023 and 11/12/2023.  FINDINGS: CTA CHEST FINDINGS Cardiovascular: The pulmonary arteries are well opacified with contrast to the level of the segmental branches. There is no evidence of acute pulmonary embolism. There is atherosclerosis of the aorta, great vessels and coronary arteries. The coronary artery involvement is severe. No acute systemic arterial abnormalities are identified. Probable calcifications of the aortic valve. The heart is mildly enlarged. No significant pericardial effusion. Mediastinum/Nodes: There are no enlarged mediastinal, hilar or axillary lymph nodes. Mild diffuse esophageal wall thickening. Lungs/Pleura: Small bilateral pleural effusions. No pneumothorax. Dependent opacities in both lung bases most likely represent atelectasis. No confluent airspace disease or suspicious pulmonary nodule. Musculoskeletal/Chest wall: No chest wall mass or suspicious osseous findings. CT ABDOMEN AND PELVIS FINDINGS Hepatobiliary: Morphologic changes of cirrhosis again noted with contour irregularity of the liver and relative enlargement of the caudate and left lobes. Early images demonstrate mildly heterogeneous enhancement, likely due to chronic passive congestion. No focal lesions are identified. No evidence of gallstones, gallbladder wall thickening or biliary dilatation. Pancreas: Unremarkable. No pancreatic ductal dilatation or surrounding inflammatory changes. Spleen: Normal in size without focal abnormality. Adrenals/Urinary Tract: Both adrenal glands appear normal. Chronic renal atrophy with poor parenchymal enhancement and excretion bilaterally, consistent with chronic medical renal disease. No hydronephrosis or focal abnormality identified. The urinary bladder is decompressed. Stomach/Bowel: There may be a small amount of enteric contrast within the colon. The stomach appears unremarkable for its degree of distention. There is peripheral displacement of bowel by increasing complex ascites in the central abdomen,  further described below. No evidence of bowel wall thickening, distension or focal surrounding inflammation. The appendix appears normal. Vascular/Lymphatic: Prominent lymph nodes in the porta hepatis and retroperitoneum, likely reactive. Mild aortic and branch vessel atherosclerosis without evidence of aneurysm or large vessel occlusion. The portal, splenic superior mesenteric veins are patent. Reproductive: The prostate gland and seminal vesicles appear unremarkable. Other: A large amount of ascites has increased in volume compared with the previous study. There is a large anterior component which now contains an air-level and multiple internal air bubbles as well as diffuse peritoneal enhancement, highly suspicious for peritonitis. Cannot exclude bowel perforation. Anterior loculated component measures up to 28.5 x 13.6 cm transverse and 25.4 cm craniocaudal. The fluid around the liver and in the pericolic gutters has not significantly increased in volume. Musculoskeletal: No acute or significant osseous findings. Scattered endplate changes in the spine, likely degenerative and/or related to chronic renal failure. Review of the MIP images confirms the above findings. IMPRESSION: 1. No evidence of acute pulmonary embolism or other acute vascular findings in the chest. 2. Small bilateral pleural effusions with dependent opacities in both lung bases, likely atelectasis. 3. Large amount of ascites has increased in volume compared with the previous study. There is a large anterior component which now contains an air-level and multiple internal air bubbles as well as diffuse peritoneal enhancement, highly suspicious for peritonitis. Cannot exclude bowel perforation. Correlate clinically. Repeat paracentesis and general surgical consultation may be helpful for evaluation. 4. Morphologic changes of cirrhosis with probable chronic passive congestion of the liver. No focal hepatic lesions identified. 5. Chronic renal  atrophy with poor parenchymal enhancement and excretion bilaterally, consistent with chronic medical renal disease. 6.  Aortic Atherosclerosis (ICD10-I70.0). 7. These results were called by telephone at the time of interpretation on 11/26/2023 at 4:41 pm to Dr Alfred Levins, who verbally acknowledged these results. Electronically Signed   By: Carey Bullocks M.D.   On: 11/26/2023 16:45               LOS: 2 days   Denece Shearer  [facility]   Pager on [email or web address]. If 7PM-7AM, please contact night-coverage at [email or web address]     11/28/2023, 1:58 PM

## 2023-11-29 DIAGNOSIS — K652 Spontaneous bacterial peritonitis: Secondary | ICD-10-CM | POA: Diagnosis not present

## 2023-11-29 DIAGNOSIS — I480 Paroxysmal atrial fibrillation: Secondary | ICD-10-CM | POA: Insufficient documentation

## 2023-11-29 LAB — GLUCOSE, CAPILLARY: Glucose-Capillary: 122 mg/dL — ABNORMAL HIGH (ref 70–99)

## 2023-11-29 LAB — CULTURE, BLOOD (ROUTINE X 2)

## 2023-11-29 MED ORDER — METOPROLOL TARTRATE 25 MG PO TABS
25.0000 mg | ORAL_TABLET | Freq: Two times a day (BID) | ORAL | Status: DC
  Administered 2023-11-29 – 2023-12-23 (×43): 25 mg via ORAL
  Filled 2023-11-29 (×46): qty 1

## 2023-11-29 NOTE — Progress Notes (Signed)
Progress Note    Casey Franco  WGN:[id number] DOB: 02-Sep-1961  DOA: 11/26/2023 PCP: [facility]      Brief Narrative:    Medical records reviewed and are as summarized below:  Casey Franco is a 63 y.o. 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  He also complained of nausea, increasing abdominal distention and multiple episodes of vomiting.      Assessment/Plan:   Principal Problem:   Spontaneous bacterial peritonitis (HCC) Active Problems:   Abdominal pain   Cirrhosis of liver with ascites (HCC)   Myocardial injury   Essential hypertension   ESRD (end stage renal disease) (HCC)   Coffee ground emesis   Paroxysmal atrial fibrillation (HCC)    Body mass index is 19.49 kg/m.   Spontaneous bacterial peritonitis, abdominal pain, liver cirrhosis with ascites: S/p paracentesis with removal of 1600 mL of fluid on 11/26/2023.  Fluid positive for SBP (ascitic fluid total nucleated cells 1,326, percentage neutrophils was 68% and absolute PMN count 901) Ascitic fluid culture showed Streptococcus mitis and Citrobacter amalonaticus No indication for surgery at this time.  General surgeon is on board. Staph epidermidis bacteremia: 1 out of 4 bottles from 11/26/2023 positive for Staph epidermidis.  This is likely a contaminant.  Repeat blood cultures from 11/27/2023 has not shown any growth thus far.   Hematemesis: Resolved.  This is probably from multiple episodes of vomiting.  Improved.  H&H is stable.   Elevated troponins: Troponin is 172, 138, 127 and 128.  This is likely from demand ischemia.   Paroxysmal atrial fibrillation, PVCs: Start metoprolol 25 mg twice daily for rate control.  We discussed risks, benefits and alternatives to long-term anticoagulation for  atrial fibrillation. He is at increased risk for bleeding from long-term anticoagulation because of underlying severe liver and kidney disease. CHA2DS2-VASc score is 3 and he is also at increased risk for stroke. He was to consider Eliquis for stroke prophylaxis. Will consider starting Eliquis when patient is closer to discharge and it becomes clear that he will not require any procedures.   Moderate mitral regurgitation with midrange ejection fraction: 2D echo showed EF estimated at 40 to 45%, moderate LVH, indeterminate left ventricular diastolic parameters, mildly reduced RV systolic function, moderately enlarged RV size, moderately dilated right atrium, small pericardial effusion, moderate mitral regurgitation   ESRD on hemodialysis: Follow-up with nephrologist for hemodialysis.   Dysphagia: Speech therapist recommended dysphagia 2 diet, thin liquids.    Comorbidities include hypertension, hyperlipidemia, BPH, type II DM   Prognosis is guarded   Follow-up with palliative care team.    Diet Order             DIET DYS 2 Fluid consistency: Thin  Diet effective now                            Consultants: General surgeon Interventional radiologist  Procedures: Paracentesis with removal of 1,600 mL of fluid on 11/26/2023    Medications:    amLODipine  10 mg Oral Daily   Chlorhexidine Gluconate Cloth  6 each Topical Q0600   feeding supplement (NEPRO CARB STEADY)  237 mL Oral BID BM   irbesartan  300 mg Oral Daily   lactulose  20 g Oral BID   metoprolol  tartrate  25 mg Oral BID   pantoprazole  40 mg Oral Daily   tamsulosin  0.4 mg Oral Daily   Continuous Infusions:  ceFEPime (MAXIPIME) IV 1 g (11/28/23 2318)     Anti-infectives (From admission, onward)    Start     Dose/Rate Route Frequency Ordered Stop   11/28/23 2200  ceFEPIme (MAXIPIME) 1 g in sodium chloride 0.9 % 100 mL IVPB        1 g 200 mL/hr over 30 Minutes Intravenous Every 24 hours  11/28/23 2120     11/27/23 1300  cefTRIAXone (ROCEPHIN) 2 g in sodium chloride 0.9 % 100 mL IVPB  Status:  Discontinued        2 g 200 mL/hr over 30 Minutes Intravenous Every 24 hours 11/26/23 2027 11/28/23 2120   11/27/23 1200  vancomycin (VANCOREADY) IVPB 1500 mg/300 mL  Status:  Discontinued        1,500 mg 150 mL/hr over 120 Minutes Intravenous  Once 11/27/23 1131 11/27/23 1223   11/26/23 1345  cefTRIAXone (ROCEPHIN) 2 g in sodium chloride 0.9 % 100 mL IVPB        2 g 200 mL/hr over 30 Minutes Intravenous Once 11/26/23 1333 11/26/23 1432   11/26/23 1345  azithromycin (ZITHROMAX) 500 mg in sodium chloride 0.9 % 250 mL IVPB        500 mg 250 mL/hr over 60 Minutes Intravenous  Once 11/26/23 1333 11/26/23 1633              Family Communication/Anticipated D/C date and plan/Code Status   DVT prophylaxis: SCDs Start: 11/26/23 2029     Code Status: Full Code  Family Communication: None Disposition Plan: Plan to discharge home   Status is: Inpatient Remains inpatient appropriate because: Spontaneous bacterial peritonitis       Subjective:   Interval events noted.  Abdominal pain is better today.  No palpitations, shortness of breath or chest pain.  Objective:    Vitals:   11/29/23 0032 11/29/23 0626 11/29/23 0941 11/29/23 1330  BP: (!) 132/92 (!) 135/99 (!) 126/91 109/79  Pulse:   (!) 117 70  Resp: 18 18 16 18   Temp: 98.5 F (36.9 C) 98.7 F (37.1 C) 97.8 F (36.6 C) 98.8 F (37.1 C)  TempSrc: Oral   Oral  SpO2: 97%  98% 96%  Weight:      Height:       No data found.   Intake/Output Summary (Last 24 hours) at 11/29/2023 1412 Last data filed at 11/28/2023 1600 Gross per 24 hour  Intake 220 ml  Output --  Net 220 ml   Filed Weights   11/26/23 1114 11/27/23 1241 11/27/23 1630  Weight: 68 kg 65.2 kg 65.2 kg    Exam:  GEN: NAD SKIN: Warm and dry EYES: No pallor or icterus ENT: MMM CV: RRR, tachycardic PULM: CTA B ABD: soft, distended, less  tender, +BS CNS: AAO x 3, non focal EXT: No edema or tenderness      Data Reviewed:   I have personally reviewed following labs and imaging studies:  Labs: Labs show the following:   Basic Metabolic Panel: Recent Labs  Lab 11/26/23 1122 11/27/23 0421 11/28/23 0827  NA 142 141 137  K 4.5 5.0 4.0  CL 100 102 101  CO2 18* 17* 20*  GLUCOSE 112* 88 66*  BUN 102* 116* 69*  CREATININE 11.92* 12.16* 7.24*  CALCIUM 9.4 8.7* 8.4*   GFR Estimated Creatinine Clearance: 9.6 mL/min (A) (  by C-G formula based on SCr of 7.24 mg/dL (H)). Liver Function Tests: Recent Labs  Lab 11/26/23 1122 11/27/23 0421  AST 30 24  ALT 16 14  ALKPHOS 96 70  BILITOT 2.2* 1.8*  PROT 7.5 6.7  ALBUMIN 2.8* 2.4*   Recent Labs  Lab 11/26/23 1122  LIPASE 35   Recent Labs  Lab 11/26/23 1921  AMMONIA 67*   Coagulation profile Recent Labs  Lab 11/26/23 1223  INR 1.9*    CBC: Recent Labs  Lab 11/26/23 1122 11/26/23 2344 11/27/23 0421 11/27/23 1125 11/28/23 0543  WBC 15.8* 15.8* 14.7* 14.0* 13.7*  NEUTROABS 13.4*  --   --   --   --   HGB 11.0* 9.9* 9.7* 9.4* 9.0*  HCT 33.1* 29.9* 28.6* 28.4* 25.4*  MCV 92.5 92.3 90.8 90.2 87.0  PLT 308 246 241 248 205   Cardiac Enzymes: No results for input(s): "CKTOTAL", "CKMB", "CKMBINDEX", "TROPONINI" in the last 168 hours. BNP (last 3 results) No results for input(s): "PROBNP" in the last 8760 hours. CBG: Recent Labs  Lab 11/27/23 0757 11/28/23 0814 11/28/23 1153 11/29/23 0938  GLUCAP 79 68* 70 122*   D-Dimer: No results for input(s): "DDIMER" in the last 72 hours. Hgb A1c: No results for input(s): "HGBA1C" in the last 72 hours.  Lipid Profile: Recent Labs    11/27/23 0421  CHOL 67  HDL <10*  LDLCALC NOT CALCULATED  TRIG 104  CHOLHDL NOT CALCULATED   Thyroid function studies: No results for input(s): "TSH", "T4TOTAL", "T3FREE", "THYROIDAB" in the last 72 hours.  Invalid input(s): "FREET3" Anemia work up: No results for  input(s): "VITAMINB12", "FOLATE", "FERRITIN", "TIBC", "IRON", "RETICCTPCT" in the last 72 hours. Sepsis Labs: Recent Labs  Lab 11/26/23 1122 11/26/23 2344 11/27/23 0421 11/27/23 1125 11/28/23 0543  WBC 15.8* 15.8* 14.7* 14.0* 13.7*  LATICACIDVEN 1.7  --   --   --   --     Microbiology Recent Results (from the past 240 hours)  Body fluid culture w Gram Stain     Status: None   Collection Time: 11/24/23  9:43 AM   Specimen: PATH Cytology Peritoneal fluid  Result Value Ref Range Status   Specimen Description   Final    PERITONEAL Performed at [facility], [address] [place], [place] [address]    Special Requests   Final    NONE Performed at [facility], [address] [place], [place] [address]    Gram Stain NO WBC SEEN NO ORGANISMS SEEN   Final   Culture   Final    NO GROWTH 3 DAYS Performed at [facility], [address] [place], [place] [address]    Report Status 11/27/2023 FINAL  Final  Culture, blood (Routine x 2)     Status: None (Preliminary result)   Collection Time: 11/26/23 11:22 AM   Specimen: BLOOD  Result Value Ref Range Status   Specimen Description   Final    BLOOD RIGHT ANTECUBITAL Performed at [facility], [address] [place], [place] [address]    Special Requests   Final    BOTTLES DRAWN AEROBIC AND ANAEROBIC Blood Culture results may not be optimal due to an inadequate volume of blood received in culture bottles Performed at [facility], [address], [place], [place] [address]    Culture  Setup Time   Final    Organism ID to follow ANAEROBIC BOTTLE ONLY GRAM NEGATIVE RODS CRITICAL RESULT CALLED TO, READ  BACK BY AND VERIFIED WITH: CAROLINE COULTER @ 11/28/23 2033 AB    Culture GRAM NEGATIVE RODS  Final   Report Status PENDING  Incomplete  Blood Culture ID Panel (Reflexed)     Status: None   Collection Time: 11/26/23 11:22 AM  Result Value Ref Range Status   Enterococcus faecalis  NOT DETECTED NOT DETECTED Final   Enterococcus Faecium NOT DETECTED NOT DETECTED Final   Listeria monocytogenes NOT DETECTED NOT DETECTED Final   Staphylococcus species NOT DETECTED NOT DETECTED Final   Staphylococcus aureus (BCID) NOT DETECTED NOT DETECTED Final   Staphylococcus epidermidis NOT DETECTED NOT DETECTED Final   Staphylococcus lugdunensis NOT DETECTED NOT DETECTED Final   Streptococcus species NOT DETECTED NOT DETECTED Final   Streptococcus agalactiae NOT DETECTED NOT DETECTED Final   Streptococcus pneumoniae NOT DETECTED NOT DETECTED Final   Streptococcus pyogenes NOT DETECTED NOT DETECTED Final   A.calcoaceticus-baumannii NOT DETECTED NOT DETECTED Final   Bacteroides fragilis NOT DETECTED NOT DETECTED Final   Enterobacterales NOT DETECTED NOT DETECTED Final   Enterobacter cloacae complex NOT DETECTED NOT DETECTED Final   Escherichia coli NOT DETECTED NOT DETECTED Final   Klebsiella aerogenes NOT DETECTED NOT DETECTED Final   Klebsiella oxytoca NOT DETECTED NOT DETECTED Final   Klebsiella pneumoniae NOT DETECTED NOT DETECTED Final   Proteus species NOT DETECTED NOT DETECTED Final   Salmonella species NOT DETECTED NOT DETECTED Final   Serratia marcescens NOT DETECTED NOT DETECTED Final   Haemophilus influenzae NOT DETECTED NOT DETECTED Final   Neisseria meningitidis NOT DETECTED NOT DETECTED Final   Pseudomonas aeruginosa NOT DETECTED NOT DETECTED Final   Stenotrophomonas maltophilia NOT DETECTED NOT DETECTED Final   Candida albicans NOT DETECTED NOT DETECTED Final   Candida auris NOT DETECTED NOT DETECTED Final   Candida glabrata NOT DETECTED NOT DETECTED Final   Candida krusei NOT DETECTED NOT DETECTED Final   Candida parapsilosis NOT DETECTED NOT DETECTED Final   Candida tropicalis NOT DETECTED NOT DETECTED Final   Cryptococcus neoformans/gattii NOT DETECTED NOT DETECTED Final    Comment: Performed at [facility], [address] [place], [place] [address]   Culture, blood (Routine x 2)     Status: Abnormal   Collection Time: 11/26/23 12:23 PM   Specimen: BLOOD LEFT ARM  Result Value Ref Range Status   Specimen Description   Final    BLOOD LEFT ARM Performed at [facility], [address], [place], [place] [address]    Special Requests   Final    BOTTLES DRAWN AEROBIC AND ANAEROBIC Blood Culture results may not be optimal due to an inadequate volume of blood received in culture bottles Performed at [facility], [address] [place], [place] [address]    Culture  Setup Time   Final    GRAM POSITIVE COCCI AEROBIC BOTTLE ONLY CRITICAL RESULT CALLED TO, READ BACK BY AND VERIFIED WITH: BRIANA ALLEY ON 11/27/23 AT 1107 QSD    Culture (A)  Final    STAPHYLOCOCCUS EPIDERMIDIS THE SIGNIFICANCE OF ISOLATING THIS ORGANISM FROM A SINGLE SET OF BLOOD CULTURES WHEN MULTIPLE SETS ARE DRAWN IS UNCERTAIN. PLEASE NOTIFY THE MICROBIOLOGY DEPARTMENT WITHIN ONE WEEK IF SPECIATION AND SENSITIVITIES ARE REQUIRED. Performed at [facility], [address] [place], [place] [address]    Report Status 11/29/2023 FINAL  Final  Blood Culture ID Panel (Reflexed)     Status: Abnormal   Collection Time: 11/26/23 12:23 PM  Result Value Ref Range Status  Enterococcus faecalis NOT DETECTED NOT DETECTED Final   Enterococcus Faecium NOT DETECTED NOT DETECTED Final   Listeria monocytogenes NOT DETECTED NOT DETECTED Final   Staphylococcus species DETECTED (A) NOT DETECTED Final    Comment: CRITICAL RESULT CALLED TO, READ BACK BY AND VERIFIED WITH: BRIANA ALLEY ON 11/27/23 AT 1107 QSD    Staphylococcus aureus (BCID) NOT DETECTED NOT DETECTED Final   Staphylococcus epidermidis DETECTED (A) NOT DETECTED Final    Comment: CRITICAL RESULT CALLED TO, READ BACK BY AND VERIFIED WITH: BRIANA ALLEY ON 11/27/23 AT 1107 QSD    Staphylococcus lugdunensis NOT DETECTED NOT DETECTED Final   Streptococcus species NOT DETECTED NOT DETECTED Final    Streptococcus agalactiae NOT DETECTED NOT DETECTED Final   Streptococcus pneumoniae NOT DETECTED NOT DETECTED Final   Streptococcus pyogenes NOT DETECTED NOT DETECTED Final   A.calcoaceticus-baumannii NOT DETECTED NOT DETECTED Final   Bacteroides fragilis NOT DETECTED NOT DETECTED Final   Enterobacterales NOT DETECTED NOT DETECTED Final   Enterobacter cloacae complex NOT DETECTED NOT DETECTED Final   Escherichia coli NOT DETECTED NOT DETECTED Final   Klebsiella aerogenes NOT DETECTED NOT DETECTED Final   Klebsiella oxytoca NOT DETECTED NOT DETECTED Final   Klebsiella pneumoniae NOT DETECTED NOT DETECTED Final   Proteus species NOT DETECTED NOT DETECTED Final   Salmonella species NOT DETECTED NOT DETECTED Final   Serratia marcescens NOT DETECTED NOT DETECTED Final   Haemophilus influenzae NOT DETECTED NOT DETECTED Final   Neisseria meningitidis NOT DETECTED NOT DETECTED Final   Pseudomonas aeruginosa NOT DETECTED NOT DETECTED Final   Stenotrophomonas maltophilia NOT DETECTED NOT DETECTED Final   Candida albicans NOT DETECTED NOT DETECTED Final   Candida auris NOT DETECTED NOT DETECTED Final   Candida glabrata NOT DETECTED NOT DETECTED Final   Candida krusei NOT DETECTED NOT DETECTED Final   Candida parapsilosis NOT DETECTED NOT DETECTED Final   Candida tropicalis NOT DETECTED NOT DETECTED Final   Cryptococcus neoformans/gattii NOT DETECTED NOT DETECTED Final   Methicillin resistance mecA/C NOT DETECTED NOT DETECTED Final    Comment: Performed at [facility], [address] [place], [place] [address]  Body fluid culture w Gram Stain     Status: None (Preliminary result)   Collection Time: 11/26/23  8:55 PM   Specimen: PATH Cytology Peritoneal fluid  Result Value Ref Range Status   Specimen Description PERITONEAL CYTO  Final   Special Requests NONE  Final   Gram Stain   Final    FEW WBC PRESENT,BOTH PMN AND MONONUCLEAR FEW GRAM POSITIVE COCCI IN CHAINS MODERATE GRAM  NEGATIVE RODS FEW GRAM POSITIVE RODS    Culture   Final    MODERATE STREPTOCOCCUS MITIS/ORALIS RARE CITROBACTER AMALONATICUS CULTURE REINCUBATED FOR BETTER GROWTH    Report Status PENDING  Incomplete   Organism ID, Bacteria STREPTOCOCCUS MITIS/ORALIS  Final   Organism ID, Bacteria CITROBACTER AMALONATICUS  Final      Susceptibility   Citrobacter amalonaticus - MIC*    CEFEPIME <=0.12 SENSITIVE Sensitive     CEFTAZIDIME <=1 SENSITIVE Sensitive     CEFTRIAXONE <=0.25 SENSITIVE Sensitive     CIPROFLOXACIN <=0.25 SENSITIVE Sensitive     GENTAMICIN <=1 SENSITIVE Sensitive     IMIPENEM <=0.25 SENSITIVE Sensitive     TRIMETH/SULFA <=20 SENSITIVE Sensitive     PIP/TAZO <=4 SENSITIVE Sensitive ug/mL    * RARE CITROBACTER AMALONATICUS   Streptococcus mitis/oralis - MIC*    PENICILLIN 0.25 INTERMEDIATE Intermediate     CEFTRIAXONE 0.25 SENSITIVE Sensitive  LEVOFLOXACIN 1 SENSITIVE Sensitive     VANCOMYCIN Value in next row Sensitive      0.5 SENSITIVEPerformed at [facility], [address], [place], [place] [address]    * MODERATE STREPTOCOCCUS MITIS/ORALIS  Culture, blood (Routine X 2) w Reflex to ID Panel     Status: None (Preliminary result)   Collection Time: 11/28/23  5:43 AM   Specimen: BLOOD LEFT ARM  Result Value Ref Range Status   Specimen Description BLOOD LEFT ARM  Final   Special Requests   Final    BOTTLES DRAWN AEROBIC AND ANAEROBIC Blood Culture results may not be optimal due to an inadequate volume of blood received in culture bottles   Culture   Final    NO GROWTH < 24 HOURS Performed at [facility], [address], [place], [place] [address]    Report Status PENDING  Incomplete  Culture, blood (Routine X 2) w Reflex to ID Panel     Status: None (Preliminary result)   Collection Time: 11/28/23  5:47 AM   Specimen: BLOOD RIGHT HAND  Result Value Ref Range Status   Specimen Description BLOOD RIGHT HAND  Final   Special Requests   Final     BOTTLES DRAWN AEROBIC AND ANAEROBIC Blood Culture results may not be optimal due to an inadequate volume of blood received in culture bottles   Culture   Final    NO GROWTH < 24 HOURS Performed at [facility], [address], [place], [place] [address]    Report Status PENDING  Incomplete    Procedures and diagnostic studies:  ECHOCARDIOGRAM COMPLETE Result Date: 11/28/2023    ECHOCARDIOGRAM REPORT   Patient Name:   Casey Franco Date of Exam: 11/28/2023 Medical Rec #:  [id number]       Height:       72.0 in Accession #:    [id number]      Weight:       143.7 lb Date of Birth:  19-Jan-1961        BSA:          1.852 m Patient Age:    62 years        BP:           141/88 mmHg Patient Gender: M               HR:           113 bpm. Exam Location:  [facility] Procedure: 2D Echo (Both Spectral and Color Flow Doppler were utilized during            procedure). Indications:     Murmur R01.1  History:         Patient has no prior history of Echocardiogram examinations.  Sonographer:     Overton Mam RDCS, FASE Referring Phys:  [id number] Lurene Shadow Diagnosing Phys: Adrian Blackwater IMPRESSIONS  1. Left ventricular ejection fraction, by estimation, is 40 to 45%. The left ventricle has mildly decreased function. The left ventricle demonstrates regional wall motion abnormalities (see scoring diagram/findings for description). The left ventricular  internal cavity size was mildly to moderately dilated. There is moderate concentric left ventricular hypertrophy. Indeterminate diastolic filling due to E-A fusion. The global longitudinal strain is abnormal.  2. T.  3. Right ventricular systolic function is mildly reduced. The right ventricular size is moderately enlarged. Moderately increased right ventricular wall thickness.  4. Right atrial size was moderately dilated.  5. A small pericardial effusion  is present. The pericardial effusion is circumferential.  6. The mitral valve is grossly normal. Moderate mitral  valve regurgitation.  7. The aortic valve is calcified. Aortic valve regurgitation is not visualized. Aortic valve sclerosis/calcification is present, without any evidence of aortic stenosis. FINDINGS  Left Ventricular Assist Device: T. Left Ventricle: Left ventricular ejection fraction, by estimation, is 40 to 45%. The left ventricle has mildly decreased function. The left ventricle demonstrates regional wall motion abnormalities. Strain was performed and the global longitudinal strain  is abnormal. The left ventricular internal cavity size was mildly to moderately dilated. There is moderate concentric left ventricular hypertrophy. Indeterminate diastolic filling due to E-A fusion.  LV Wall Scoring: The mid and distal inferior wall and basal anteroseptal segment are hypokinetic. Right Ventricle: The right ventricular size is moderately enlarged. Moderately increased right ventricular wall thickness. Right ventricular systolic function is mildly reduced. Left Atrium: Left atrial size was normal in size. Right Atrium: Right atrial size was moderately dilated. Pericardium: A small pericardial effusion is present. The pericardial effusion is circumferential. Mitral Valve: The mitral valve is grossly normal. Moderate mitral valve regurgitation. Tricuspid Valve: The tricuspid valve is grossly normal. Tricuspid valve regurgitation is mild. Aortic Valve: The aortic valve is calcified. Aortic valve regurgitation is not visualized. Aortic valve sclerosis/calcification is present, without any evidence of aortic stenosis. Aortic valve peak gradient measures 11.8 mmHg. Pulmonic Valve: The pulmonic valve was grossly normal. Pulmonic valve regurgitation is trivial. Aorta: The aortic root, ascending aorta and aortic arch are all structurally normal, with no evidence of dilitation or obstruction. IAS/Shunts: No atrial level shunt detected by color flow Doppler. Additional Comments: 3D was performed not requiring image post  processing on an independent workstation and was abnormal.  LEFT VENTRICLE PLAX 2D LVIDd:         4.80 cm      Diastology LVIDs:         3.80 cm      LV e' medial:    6.64 cm/s LV PW:         1.30 cm      LV E/e' medial:  15.4 LV IVS:        1.00 cm      LV e' lateral:   12.20 cm/s LVOT diam:     2.00 cm      LV E/e' lateral: 8.4 LV SV:         42 LV SV Index:   23 LVOT Area:     3.14 cm  LV Volumes (MOD) LV vol d, MOD A2C: 165.0 ml LV vol d, MOD A4C: 98.8 ml LV vol s, MOD A2C: 105.0 ml LV vol s, MOD A4C: 56.8 ml LV SV MOD A2C:     60.0 ml LV SV MOD A4C:     98.8 ml LV SV MOD BP:      49.6 ml RIGHT VENTRICLE RV Basal diam:  3.80 cm RV S prime:     5.33 cm/s TAPSE (M-mode): 0.7 cm LEFT ATRIUM             Index        RIGHT ATRIUM           Index LA diam:        4.70 cm 2.54 cm/m   RA Area:     24.50 cm LA Vol (A2C):   79.6 ml 42.99 ml/m  RA Volume:   84.30 ml  45.53 ml/m LA Vol (A4C):   67.7 ml 36.56  ml/m LA Biplane Vol: 77.9 ml 42.07 ml/m  AORTIC VALVE                 PULMONIC VALVE AV Area (Vmax): 1.70 cm     PV Vmax:          1.48 m/s AV Vmax:        171.50 cm/s  PV Peak grad:     8.8 mmHg AV Peak Grad:   11.8 mmHg    PR End Diast Vel: 10.11 msec LVOT Vmax:      92.60 cm/s   RVOT Peak grad:   4 mmHg LVOT Vmean:     60.000 cm/s LVOT VTI:       0.134 m  AORTA Ao Root diam: 3.30 cm Ao Asc diam:  2.90 cm MITRAL VALVE                TRICUSPID VALVE MV Area (PHT): 3.23 cm     TR Peak grad:   37.5 mmHg MV Decel Time: 235 msec     TR Vmax:        306.00 cm/s MV E velocity: 102.00 cm/s                             SHUNTS                             Systemic VTI:  0.13 m                             Systemic Diam: 2.00 cm Adrian Blackwater Electronically signed by Adrian Blackwater Signature Date/Time: 11/28/2023/4:36:18 PM    Final                LOS: 3 days   Philbert Ocallaghan  [facility]   Pager on [email or web address]. If 7PM-7AM, please contact night-coverage at [email or web address]     11/29/2023, 2:12 PM

## 2023-11-29 NOTE — Progress Notes (Signed)
Patient ID: Casey Franco, male   DOB: [birth year], 64 y.o.   MRN: [id number]     SURGICAL PROGRESS NOTE   Hospital Day(s): 3.   Interval History: Patient seen and examined, no acute events or new complaints overnight. Patient reports feeling okay.  He denies any worsening pain.  He denies any nausea or vomiting.  She continued passing gas.  No pain radiation.  No alleviating or aggravating factors.  Denies any fever or chills.  Vital signs in last 24 hours: [min-max] current  Temp:  [97.8 F (36.6 C)-98.7 F (37.1 C)] 97.8 F (36.6 C) (02/16 0941) Pulse Rate:  [114-119] 117 (02/16 0941) Resp:  [16-19] 16 (02/16 0941) BP: (119-146)/(88-99) 126/91 (02/16 0941) SpO2:  [97 %-98 %] 98 % (02/16 0941)     Height: 6' (182.9 cm) Weight: 65.2 kg BMI (Calculated): 19.49   Physical Exam:  Constitutional: alert, cooperative and no distress  Respiratory: breathing non-labored at rest  Cardiovascular: regular rate and sinus rhythm  Gastrointestinal: soft, non-tender, and globose  Labs:     Latest Ref Rng & Units 11/28/2023    5:43 AM 11/27/2023   11:25 AM 11/27/2023    4:21 AM  CBC  WBC 4.0 - 10.5 K/uL 13.7  14.0  14.7   Hemoglobin 13.0 - 17.0 g/dL 9.0  9.4  9.7   Hematocrit 39.0 - 52.0 % 25.4  28.4  28.6   Platelets 150 - 400 K/uL 205  248  241       Latest Ref Rng & Units 11/28/2023    8:27 AM 11/27/2023    4:21 AM 11/26/2023   11:22 AM  CMP  Glucose 70 - 99 mg/dL 66  88  409   BUN 8 - 23 mg/dL 69  811  914   Creatinine 0.61 - 1.24 mg/dL 7.82  95.62  13.08   Sodium 135 - 145 mmol/L 137  141  142   Potassium 3.5 - 5.1 mmol/L 4.0  5.0  4.5   Chloride 98 - 111 mmol/L 101  102  100   CO2 22 - 32 mmol/L 20  17  18    Calcium 8.9 - 10.3 mg/dL 8.4  8.7  9.4   Total Protein 6.5 - 8.1 g/dL  6.7  7.5   Total Bilirubin 0.0 - 1.2 mg/dL  1.8  2.2   Alkaline Phos 38 - 126 U/L  70  96   AST 15 - 41 U/L  24  30   ALT 0 - 44 U/L  14  16     Imaging studies: No new pertinent imaging  studies   Assessment/Plan:  63 y.o. male with infected ascites status post paracentesis, complicated by pertinent comorbidities including cirrhosis, ESRD.   -There is no fever, stable vital signs -Continue very slow decrease of white blood cell count -Abdominal exam benign, nontender palpation -No indication of any urgent surgery.  Patient high risk for surgery.  If there is any clinical deterioration, consider repeating CT scan to see if there is any localized collection that can be percutaneously drained instead of aspirated for source control -Continue IV antibiotic therapy -Follow-up paracentesis culture -No contraindication for diet from surgical standpoint -Will continue to follow  Gae Gallop, MD

## 2023-11-29 NOTE — Progress Notes (Addendum)
[facility]  ROUNDING NOTE   Subjective:   Casey Franco is a 63 y.o. male with past medical history of HTN, ESRD, DM-2, and Hep C. Patient presents to emergency department with abdominal pain. He has been admitted for Intractable vomiting [R11.10] Abdominal pain [R10.9] Pneumonia of both lungs due to infectious organism, unspecified part of lung [J18.9] Sepsis without acute organ dysfunction, due to unspecified organism [facility]) [A41.9]  Patient is known to our practice and receives outpatient dialysis at [facility] on a MWF, last treatment on Tuesday.    Patient seen resting in bed Alert and oriented denies pain or discomfort Remains on room air Appetite remains poor States he is consuming ordered Nepro   Objective:  Vital signs in last 24 hours:  Temp:  [97.8 F (36.6 C)-98.7 F (37.1 C)] 97.8 F (36.6 C) (02/16 0941) Pulse Rate:  [114-117] 117 (02/16 0941) Resp:  [16-19] 16 (02/16 0941) BP: (119-146)/(91-99) 126/91 (02/16 0941) SpO2:  [97 %-98 %] 98 % (02/16 0941)  Weight change:  Filed Weights   11/26/23 1114 11/27/23 1241 11/27/23 1630  Weight: 68 kg 65.2 kg 65.2 kg    Intake/Output: I/O last 3 completed shifts: In: 220 [P.O.:120; IV Piggyback:100] Out: -    Intake/Output this shift:  No intake/output data recorded.  Physical Exam: General: NAD frail  Head: Normocephalic, atraumatic. Moist oral mucosal membranes  Eyes: Anicteric  Lungs:  Clear to auscultation, normal effort  Heart: Regular rate and rhythm  Abdomen:  Soft, nontender, mild distension  Extremities: No peripheral edema.  Neurologic: Alert and oriented, moving all four extremities  Skin: No lesions  Access: Right chest tunneled catheter    Basic Metabolic Panel: Recent Labs  Lab 11/26/23 1122 11/27/23 0421 11/28/23 0827  NA 142 141 137  K 4.5 5.0 4.0  CL 100 102 101  CO2 18* 17* 20*  GLUCOSE 112* 88 66*  BUN 102* 116* 69*  CREATININE 11.92* 12.16* 7.24*   CALCIUM 9.4 8.7* 8.4*    Liver Function Tests: Recent Labs  Lab 11/26/23 1122 11/27/23 0421  AST 30 24  ALT 16 14  ALKPHOS 96 70  BILITOT 2.2* 1.8*  PROT 7.5 6.7  ALBUMIN 2.8* 2.4*   Recent Labs  Lab 11/26/23 1122  LIPASE 35   Recent Labs  Lab 11/26/23 1921  AMMONIA 67*    CBC: Recent Labs  Lab 11/26/23 1122 11/26/23 2344 11/27/23 0421 11/27/23 1125 11/28/23 0543  WBC 15.8* 15.8* 14.7* 14.0* 13.7*  NEUTROABS 13.4*  --   --   --   --   HGB 11.0* 9.9* 9.7* 9.4* 9.0*  HCT 33.1* 29.9* 28.6* 28.4* 25.4*  MCV 92.5 92.3 90.8 90.2 87.0  PLT 308 246 241 248 205    Cardiac Enzymes: No results for input(s): "CKTOTAL", "CKMB", "CKMBINDEX", "TROPONINI" in the last 168 hours.  BNP: Invalid input(s): "POCBNP"  CBG: Recent Labs  Lab 11/27/23 0757 11/28/23 0814 11/28/23 1153 11/29/23 0938  GLUCAP 79 68* 70 122*    Microbiology: Results for orders placed or performed during the hospital encounter of 11/26/23  Culture, blood (Routine x 2)     Status: None (Preliminary result)   Collection Time: 11/26/23 11:22 AM   Specimen: BLOOD  Result Value Ref Range Status   Specimen Description   Final    BLOOD RIGHT ANTECUBITAL Performed at [facility], [address] [place], [place] [address]    Special Requests   Final    BOTTLES DRAWN  AEROBIC AND ANAEROBIC Blood Culture results may not be optimal due to an inadequate volume of blood received in culture bottles Performed at [facility], [address] [place], [place] [address]    Culture  Setup Time   Final    Organism ID to follow ANAEROBIC BOTTLE ONLY GRAM NEGATIVE RODS CRITICAL RESULT CALLED TO, READ BACK BY AND VERIFIED WITH: CAROLINE COULTER @ 11/28/23 2033 AB    Culture GRAM NEGATIVE RODS  Final   Report Status PENDING  Incomplete  Blood Culture ID Panel (Reflexed)     Status: None   Collection Time: 11/26/23 11:22 AM  Result Value Ref Range Status   Enterococcus faecalis NOT  DETECTED NOT DETECTED Final   Enterococcus Faecium NOT DETECTED NOT DETECTED Final   Listeria monocytogenes NOT DETECTED NOT DETECTED Final   Staphylococcus species NOT DETECTED NOT DETECTED Final   Staphylococcus aureus (BCID) NOT DETECTED NOT DETECTED Final   Staphylococcus epidermidis NOT DETECTED NOT DETECTED Final   Staphylococcus lugdunensis NOT DETECTED NOT DETECTED Final   Streptococcus species NOT DETECTED NOT DETECTED Final   Streptococcus agalactiae NOT DETECTED NOT DETECTED Final   Streptococcus pneumoniae NOT DETECTED NOT DETECTED Final   Streptococcus pyogenes NOT DETECTED NOT DETECTED Final   A.calcoaceticus-baumannii NOT DETECTED NOT DETECTED Final   Bacteroides fragilis NOT DETECTED NOT DETECTED Final   Enterobacterales NOT DETECTED NOT DETECTED Final   Enterobacter cloacae complex NOT DETECTED NOT DETECTED Final   Escherichia coli NOT DETECTED NOT DETECTED Final   Klebsiella aerogenes NOT DETECTED NOT DETECTED Final   Klebsiella oxytoca NOT DETECTED NOT DETECTED Final   Klebsiella pneumoniae NOT DETECTED NOT DETECTED Final   Proteus species NOT DETECTED NOT DETECTED Final   Salmonella species NOT DETECTED NOT DETECTED Final   Serratia marcescens NOT DETECTED NOT DETECTED Final   Haemophilus influenzae NOT DETECTED NOT DETECTED Final   Neisseria meningitidis NOT DETECTED NOT DETECTED Final   Pseudomonas aeruginosa NOT DETECTED NOT DETECTED Final   Stenotrophomonas maltophilia NOT DETECTED NOT DETECTED Final   Candida albicans NOT DETECTED NOT DETECTED Final   Candida auris NOT DETECTED NOT DETECTED Final   Candida glabrata NOT DETECTED NOT DETECTED Final   Candida krusei NOT DETECTED NOT DETECTED Final   Candida parapsilosis NOT DETECTED NOT DETECTED Final   Candida tropicalis NOT DETECTED NOT DETECTED Final   Cryptococcus neoformans/gattii NOT DETECTED NOT DETECTED Final    Comment: Performed at [facility], [address] [place], [place] [address]   Culture, blood (Routine x 2)     Status: Abnormal   Collection Time: 11/26/23 12:23 PM   Specimen: BLOOD LEFT ARM  Result Value Ref Range Status   Specimen Description   Final    BLOOD LEFT ARM Performed at [facility], [address] [place], [place] [address]    Special Requests   Final    BOTTLES DRAWN AEROBIC AND ANAEROBIC Blood Culture results may not be optimal due to an inadequate volume of blood received in culture bottles Performed at [facility], [address] [place], [place] [address]    Culture  Setup Time   Final    GRAM POSITIVE COCCI AEROBIC BOTTLE ONLY CRITICAL RESULT CALLED TO, READ BACK BY AND VERIFIED WITH: BRIANA ALLEY ON 11/27/23 AT 1107 QSD    Culture (A)  Final    STAPHYLOCOCCUS EPIDERMIDIS THE SIGNIFICANCE OF ISOLATING THIS ORGANISM FROM A SINGLE SET OF BLOOD CULTURES WHEN MULTIPLE SETS ARE DRAWN IS UNCERTAIN. PLEASE  NOTIFY THE MICROBIOLOGY DEPARTMENT WITHIN ONE WEEK IF SPECIATION AND SENSITIVITIES ARE REQUIRED. Performed at [facility], [address], [place], [place] [address]    Report Status 11/29/2023 FINAL  Final  Blood Culture ID Panel (Reflexed)     Status: Abnormal   Collection Time: 11/26/23 12:23 PM  Result Value Ref Range Status   Enterococcus faecalis NOT DETECTED NOT DETECTED Final   Enterococcus Faecium NOT DETECTED NOT DETECTED Final   Listeria monocytogenes NOT DETECTED NOT DETECTED Final   Staphylococcus species DETECTED (A) NOT DETECTED Final    Comment: CRITICAL RESULT CALLED TO, READ BACK BY AND VERIFIED WITH: BRIANA ALLEY ON 11/27/23 AT 1107 QSD    Staphylococcus aureus (BCID) NOT DETECTED NOT DETECTED Final   Staphylococcus epidermidis DETECTED (A) NOT DETECTED Final    Comment: CRITICAL RESULT CALLED TO, READ BACK BY AND VERIFIED WITH: BRIANA ALLEY ON 11/27/23 AT 1107 QSD    Staphylococcus lugdunensis NOT DETECTED NOT DETECTED Final   Streptococcus species NOT DETECTED NOT DETECTED Final    Streptococcus agalactiae NOT DETECTED NOT DETECTED Final   Streptococcus pneumoniae NOT DETECTED NOT DETECTED Final   Streptococcus pyogenes NOT DETECTED NOT DETECTED Final   A.calcoaceticus-baumannii NOT DETECTED NOT DETECTED Final   Bacteroides fragilis NOT DETECTED NOT DETECTED Final   Enterobacterales NOT DETECTED NOT DETECTED Final   Enterobacter cloacae complex NOT DETECTED NOT DETECTED Final   Escherichia coli NOT DETECTED NOT DETECTED Final   Klebsiella aerogenes NOT DETECTED NOT DETECTED Final   Klebsiella oxytoca NOT DETECTED NOT DETECTED Final   Klebsiella pneumoniae NOT DETECTED NOT DETECTED Final   Proteus species NOT DETECTED NOT DETECTED Final   Salmonella species NOT DETECTED NOT DETECTED Final   Serratia marcescens NOT DETECTED NOT DETECTED Final   Haemophilus influenzae NOT DETECTED NOT DETECTED Final   Neisseria meningitidis NOT DETECTED NOT DETECTED Final   Pseudomonas aeruginosa NOT DETECTED NOT DETECTED Final   Stenotrophomonas maltophilia NOT DETECTED NOT DETECTED Final   Candida albicans NOT DETECTED NOT DETECTED Final   Candida auris NOT DETECTED NOT DETECTED Final   Candida glabrata NOT DETECTED NOT DETECTED Final   Candida krusei NOT DETECTED NOT DETECTED Final   Candida parapsilosis NOT DETECTED NOT DETECTED Final   Candida tropicalis NOT DETECTED NOT DETECTED Final   Cryptococcus neoformans/gattii NOT DETECTED NOT DETECTED Final   Methicillin resistance mecA/C NOT DETECTED NOT DETECTED Final    Comment: Performed at [facility], [address] [place], [place] [address]  Body fluid culture w Gram Stain     Status: None (Preliminary result)   Collection Time: 11/26/23  8:55 PM   Specimen: PATH Cytology Peritoneal fluid  Result Value Ref Range Status   Specimen Description PERITONEAL CYTO  Final   Special Requests NONE  Final   Gram Stain   Final    FEW WBC PRESENT,BOTH PMN AND MONONUCLEAR FEW GRAM POSITIVE COCCI IN CHAINS MODERATE GRAM  NEGATIVE RODS FEW GRAM POSITIVE RODS    Culture   Final    MODERATE STREPTOCOCCUS MITIS/ORALIS RARE CITROBACTER AMALONATICUS CULTURE REINCUBATED FOR BETTER GROWTH    Report Status PENDING  Incomplete   Organism ID, Bacteria STREPTOCOCCUS MITIS/ORALIS  Final   Organism ID, Bacteria CITROBACTER AMALONATICUS  Final      Susceptibility   Citrobacter amalonaticus - MIC*    CEFEPIME <=0.12 SENSITIVE Sensitive     CEFTAZIDIME <=1 SENSITIVE Sensitive     CEFTRIAXONE <=0.25 SENSITIVE Sensitive     CIPROFLOXACIN <=0.25 SENSITIVE Sensitive  GENTAMICIN <=1 SENSITIVE Sensitive     IMIPENEM <=0.25 SENSITIVE Sensitive     TRIMETH/SULFA <=20 SENSITIVE Sensitive     PIP/TAZO <=4 SENSITIVE Sensitive ug/mL    * RARE CITROBACTER AMALONATICUS   Streptococcus mitis/oralis - MIC*    PENICILLIN 0.25 INTERMEDIATE Intermediate     CEFTRIAXONE 0.25 SENSITIVE Sensitive     LEVOFLOXACIN 1 SENSITIVE Sensitive     VANCOMYCIN Value in next row Sensitive      0.5 SENSITIVEPerformed at [facility], [address], [place], [place] [address]    * MODERATE STREPTOCOCCUS MITIS/ORALIS  Culture, blood (Routine X 2) w Reflex to ID Panel     Status: None (Preliminary result)   Collection Time: 11/28/23  5:43 AM   Specimen: BLOOD LEFT ARM  Result Value Ref Range Status   Specimen Description BLOOD LEFT ARM  Final   Special Requests   Final    BOTTLES DRAWN AEROBIC AND ANAEROBIC Blood Culture results may not be optimal due to an inadequate volume of blood received in culture bottles   Culture   Final    NO GROWTH < 24 HOURS Performed at [facility], [address] [place], [place] [address]    Report Status PENDING  Incomplete  Culture, blood (Routine X 2) w Reflex to ID Panel     Status: None (Preliminary result)   Collection Time: 11/28/23  5:47 AM   Specimen: BLOOD RIGHT HAND  Result Value Ref Range Status   Specimen Description BLOOD RIGHT HAND  Final   Special Requests   Final     BOTTLES DRAWN AEROBIC AND ANAEROBIC Blood Culture results may not be optimal due to an inadequate volume of blood received in culture bottles   Culture   Final    NO GROWTH < 24 HOURS Performed at [facility], [address], [place], [place] [address]    Report Status PENDING  Incomplete    Coagulation Studies: No results for input(s): "LABPROT", "INR" in the last 72 hours.   Urinalysis: No results for input(s): "COLORURINE", "LABSPEC", "PHURINE", "GLUCOSEU", "HGBUR", "BILIRUBINUR", "KETONESUR", "PROTEINUR", "UROBILINOGEN", "NITRITE", "LEUKOCYTESUR" in the last 72 hours.  Invalid input(s): "APPERANCEUR"    Imaging: ECHOCARDIOGRAM COMPLETE Result Date: 11/28/2023    ECHOCARDIOGRAM REPORT   Patient Name:   Casey Franco Date of Exam: 11/28/2023 Medical Rec #:  [id number]       Height:       72.0 in Accession #:    [id number]      Weight:       143.7 lb Date of Birth:  1961-06-10        BSA:          1.852 m Patient Age:    62 years        BP:           141/88 mmHg Patient Gender: M               HR:           113 bpm. Exam Location:  [facility] Procedure: 2D Echo (Both Spectral and Color Flow Doppler were utilized during            procedure). Indications:     Murmur R01.1  History:         Patient has no prior history of Echocardiogram examinations.  Sonographer:     Overton Mam RDCS, FASE Referring Phys:  [id number] Lurene Shadow Diagnosing Phys: Adrian Blackwater IMPRESSIONS  1. Left  ventricular ejection fraction, by estimation, is 40 to 45%. The left ventricle has mildly decreased function. The left ventricle demonstrates regional wall motion abnormalities (see scoring diagram/findings for description). The left ventricular  internal cavity size was mildly to moderately dilated. There is moderate concentric left ventricular hypertrophy. Indeterminate diastolic filling due to E-A fusion. The global longitudinal strain is abnormal.  2. T.  3. Right ventricular systolic function is mildly  reduced. The right ventricular size is moderately enlarged. Moderately increased right ventricular wall thickness.  4. Right atrial size was moderately dilated.  5. A small pericardial effusion is present. The pericardial effusion is circumferential.  6. The mitral valve is grossly normal. Moderate mitral valve regurgitation.  7. The aortic valve is calcified. Aortic valve regurgitation is not visualized. Aortic valve sclerosis/calcification is present, without any evidence of aortic stenosis. FINDINGS  Left Ventricular Assist Device: T. Left Ventricle: Left ventricular ejection fraction, by estimation, is 40 to 45%. The left ventricle has mildly decreased function. The left ventricle demonstrates regional wall motion abnormalities. Strain was performed and the global longitudinal strain  is abnormal. The left ventricular internal cavity size was mildly to moderately dilated. There is moderate concentric left ventricular hypertrophy. Indeterminate diastolic filling due to E-A fusion.  LV Wall Scoring: The mid and distal inferior wall and basal anteroseptal segment are hypokinetic. Right Ventricle: The right ventricular size is moderately enlarged. Moderately increased right ventricular wall thickness. Right ventricular systolic function is mildly reduced. Left Atrium: Left atrial size was normal in size. Right Atrium: Right atrial size was moderately dilated. Pericardium: A small pericardial effusion is present. The pericardial effusion is circumferential. Mitral Valve: The mitral valve is grossly normal. Moderate mitral valve regurgitation. Tricuspid Valve: The tricuspid valve is grossly normal. Tricuspid valve regurgitation is mild. Aortic Valve: The aortic valve is calcified. Aortic valve regurgitation is not visualized. Aortic valve sclerosis/calcification is present, without any evidence of aortic stenosis. Aortic valve peak gradient measures 11.8 mmHg. Pulmonic Valve: The pulmonic valve was grossly normal.  Pulmonic valve regurgitation is trivial. Aorta: The aortic root, ascending aorta and aortic arch are all structurally normal, with no evidence of dilitation or obstruction. IAS/Shunts: No atrial level shunt detected by color flow Doppler. Additional Comments: 3D was performed not requiring image post processing on an independent workstation and was abnormal.  LEFT VENTRICLE PLAX 2D LVIDd:         4.80 cm      Diastology LVIDs:         3.80 cm      LV e' medial:    6.64 cm/s LV PW:         1.30 cm      LV E/e' medial:  15.4 LV IVS:        1.00 cm      LV e' lateral:   12.20 cm/s LVOT diam:     2.00 cm      LV E/e' lateral: 8.4 LV SV:         42 LV SV Index:   23 LVOT Area:     3.14 cm  LV Volumes (MOD) LV vol d, MOD A2C: 165.0 ml LV vol d, MOD A4C: 98.8 ml LV vol s, MOD A2C: 105.0 ml LV vol s, MOD A4C: 56.8 ml LV SV MOD A2C:     60.0 ml LV SV MOD A4C:     98.8 ml LV SV MOD BP:      49.6 ml RIGHT VENTRICLE RV Basal diam:  3.80 cm RV S prime:     5.33 cm/s TAPSE (M-mode): 0.7 cm LEFT ATRIUM             Index        RIGHT ATRIUM           Index LA diam:        4.70 cm 2.54 cm/m   RA Area:     24.50 cm LA Vol (A2C):   79.6 ml 42.99 ml/m  RA Volume:   84.30 ml  45.53 ml/m LA Vol (A4C):   67.7 ml 36.56 ml/m LA Biplane Vol: 77.9 ml 42.07 ml/m  AORTIC VALVE                 PULMONIC VALVE AV Area (Vmax): 1.70 cm     PV Vmax:          1.48 m/s AV Vmax:        171.50 cm/s  PV Peak grad:     8.8 mmHg AV Peak Grad:   11.8 mmHg    PR End Diast Vel: 10.11 msec LVOT Vmax:      92.60 cm/s   RVOT Peak grad:   4 mmHg LVOT Vmean:     60.000 cm/s LVOT VTI:       0.134 m  AORTA Ao Root diam: 3.30 cm Ao Asc diam:  2.90 cm MITRAL VALVE                TRICUSPID VALVE MV Area (PHT): 3.23 cm     TR Peak grad:   37.5 mmHg MV Decel Time: 235 msec     TR Vmax:        306.00 cm/s MV E velocity: 102.00 cm/s                             SHUNTS                             Systemic VTI:  0.13 m                             Systemic Diam: 2.00  cm Adrian Blackwater Electronically signed by Adrian Blackwater Signature Date/Time: 11/28/2023/4:36:18 PM    Final      Medications:    ceFEPime (MAXIPIME) IV 1 g (11/28/23 2318)    amLODipine  10 mg Oral Daily   Chlorhexidine Gluconate Cloth  6 each Topical Q0600   feeding supplement (NEPRO CARB STEADY)  237 mL Oral BID BM   irbesartan  300 mg Oral Daily   lactulose  20 g Oral BID   metoprolol tartrate  25 mg Oral BID   pantoprazole  40 mg Oral Daily   tamsulosin  0.4 mg Oral Daily   acetaminophen, fentaNYL (SUBLIMAZE) injection, hydrALAZINE, ondansetron (ZOFRAN) IV, oxyCODONE  Assessment/ Plan:  Casey Franco is a 63 y.o.  male with HTN, ESRD, DM-2, Hep C.  Patient presents with chest pain and has been admitted for Intractable vomiting [R11.10] Abdominal pain [R10.9] Pneumonia of both lungs due to infectious organism, unspecified part of lung [J18.9] Sepsis without acute organ dysfunction, due to unspecified organism [facility]) [A41.9]  [facility]/MWF/right chest PermCath/71.0 kg  End-stage renal disease on hemodialysis.  Next treatment scheduled for Monday.  2. Anemia of chronic kidney disease Lab Results  Component Value  Date   HGB 9.0 (L) 11/28/2023    Patient does receive Mircera at outpatient clinic.  Will consider need of low-dose EPO with updated labs tomorrow.  3. Secondary Hyperparathyroidism: with outpatient labs: PTH 351, phosphorus 6.6, calcium 8.1 on 11/23/2023.   Lab Results  Component Value Date   PTH 31 08/30/2018   CALCIUM 8.4 (L) 11/28/2023   PHOS 11.2 (H) 02/04/2021    Patient prescribed calcium acetate outpatient.will order updated labs in AM.  4.  Hypertension with chronic kidney disease.  Home regimen includes furosemide, hydralazine, amlodipine, and irbesartan. Blood pressure stable, 126/91  5.  Cirrhosis/ascites, spontaneous bacterial peritonitis. Paracentesis from 11/27/2023 yielded 1.6 L of fluid.  Complex loculations suspected as  more ascites present. Microbiology results show Staph epidermidis in the blood. Peritoneal fluid culture from 11/26/2023 shows Streptococcus and Citrobacter. Currently getting broad-spectrum antibiotics cefepime. Also getting lactulose twice a day.   LOS: 3 Shantelle Breeze 2/16/20251:00 PM   Patient was seen and examined with Wendee Beavers, NP.  Plan of care was formulated for the problems addressed and discussed with NP.  I agree with the note as documented except as noted below.

## 2023-11-29 NOTE — Plan of Care (Signed)

## 2023-11-30 DIAGNOSIS — Z7189 Other specified counseling: Secondary | ICD-10-CM | POA: Diagnosis not present

## 2023-11-30 DIAGNOSIS — A419 Sepsis, unspecified organism: Secondary | ICD-10-CM

## 2023-11-30 DIAGNOSIS — K7460 Unspecified cirrhosis of liver: Secondary | ICD-10-CM | POA: Diagnosis not present

## 2023-11-30 DIAGNOSIS — K652 Spontaneous bacterial peritonitis: Secondary | ICD-10-CM | POA: Diagnosis not present

## 2023-11-30 DIAGNOSIS — N186 End stage renal disease: Secondary | ICD-10-CM | POA: Diagnosis not present

## 2023-11-30 DIAGNOSIS — R188 Other ascites: Secondary | ICD-10-CM | POA: Diagnosis not present

## 2023-11-30 HISTORY — DX: Sepsis, unspecified organism: A41.9

## 2023-11-30 LAB — CBC
HCT: 26.9 % — ABNORMAL LOW (ref 39.0–52.0)
Hemoglobin: 8.9 g/dL — ABNORMAL LOW (ref 13.0–17.0)
MCH: 30.2 pg (ref 26.0–34.0)
MCHC: 33.1 g/dL (ref 30.0–36.0)
MCV: 91.2 fL (ref 80.0–100.0)
Platelets: 190 10*3/uL (ref 150–400)
RBC: 2.95 MIL/uL — ABNORMAL LOW (ref 4.22–5.81)
RDW: 15.6 % — ABNORMAL HIGH (ref 11.5–15.5)
WBC: 11.5 10*3/uL — ABNORMAL HIGH (ref 4.0–10.5)
nRBC: 0 % (ref 0.0–0.2)

## 2023-11-30 LAB — BODY FLUID CULTURE W GRAM STAIN

## 2023-11-30 LAB — RENAL FUNCTION PANEL
Albumin: 2.4 g/dL — ABNORMAL LOW (ref 3.5–5.0)
Anion gap: 25 — ABNORMAL HIGH (ref 5–15)
BUN: 94 mg/dL — ABNORMAL HIGH (ref 8–23)
CO2: 16 mmol/L — ABNORMAL LOW (ref 22–32)
Calcium: 8.6 mg/dL — ABNORMAL LOW (ref 8.9–10.3)
Chloride: 96 mmol/L — ABNORMAL LOW (ref 98–111)
Creatinine, Ser: 9.72 mg/dL — ABNORMAL HIGH (ref 0.61–1.24)
GFR, Estimated: 6 mL/min — ABNORMAL LOW (ref >60)
Glucose, Bld: 93 mg/dL (ref 70–99)
Phosphorus: 5.8 mg/dL — ABNORMAL HIGH (ref 2.5–4.6)
Potassium: 4.7 mmol/L (ref 3.5–5.1)
Sodium: 137 mmol/L (ref 135–145)

## 2023-11-30 LAB — GLUCOSE, CAPILLARY: Glucose-Capillary: 87 mg/dL (ref 70–99)

## 2023-11-30 LAB — CYTOLOGY - NON PAP

## 2023-11-30 MED ORDER — HEPARIN SODIUM (PORCINE) 1000 UNIT/ML IJ SOLN
INTRAMUSCULAR | Status: AC
  Filled 2023-11-30: qty 10

## 2023-11-30 MED ORDER — ALTEPLASE 2 MG IJ SOLR: 2.0000 mg | Freq: Once | INTRAMUSCULAR | Status: DC | PRN

## 2023-11-30 MED ORDER — HEPARIN SODIUM (PORCINE) 1000 UNIT/ML DIALYSIS
1000.0000 [IU] | INTRAMUSCULAR | Status: DC | PRN
  Filled 2023-11-30: qty 1

## 2023-11-30 MED ORDER — OXYCODONE HCL 5 MG PO TABS
5.0000 mg | ORAL_TABLET | Freq: Once | ORAL | Status: AC
  Administered 2023-11-30: 5 mg via ORAL

## 2023-11-30 MED ORDER — OXYCODONE HCL 5 MG PO TABS
ORAL_TABLET | ORAL | Status: AC
  Filled 2023-11-30: qty 1

## 2023-11-30 MED ORDER — ALUM & MAG HYDROXIDE-SIMETH 200-200-20 MG/5ML PO SUSP
30.0000 mL | ORAL | Status: DC | PRN
  Administered 2023-11-30: 30 mL via ORAL
  Filled 2023-11-30 (×2): qty 30

## 2023-11-30 NOTE — Progress Notes (Signed)
Progress Note    CONNERY SHIFFLER  WGN:[id number] DOB: [birth year]  DOA: 11/26/2023 PCP: [facility]      Brief Narrative:    Medical records reviewed and are as summarized below:  TERRALL BLEY is a 63 y.o. 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  He also complained of nausea, increasing abdominal distention and multiple episodes of vomiting.      Assessment/Plan:   Principal Problem:   Spontaneous bacterial peritonitis (HCC) Active Problems:   Abdominal pain   Cirrhosis of liver with ascites (HCC)   Myocardial injury   Essential hypertension   ESRD (end stage renal disease) (HCC)   Coffee ground emesis   Paroxysmal atrial fibrillation (HCC)   Sepsis without acute organ dysfunction (HCC)    Body mass index is 18.78 kg/m.   Spontaneous bacterial peritonitis, abdominal pain, liver cirrhosis with ascites: S/p paracentesis with removal of 1600 mL of fluid on 11/26/2023.  Fluid positive for SBP (ascitic fluid total nucleated cells 1,326, percentage neutrophils was 68% and absolute PMN count 901) Ascitic fluid culture showed Streptococcus mitis and Citrobacter amalonaticus Repeat paracentesis tomorrow. No indication for surgery at this time.  General surgeon is on board.  Staph epidermidis bacteremia: 1 out of 4 bottles from 11/26/2023 positive for Staph epidermidis.  This is likely a contaminant.  Repeat blood cultures from 11/27/2023 has not shown any growth thus far.  Gram-negative rod bacteremia (blood cultures from 11/26/2023 growing gram-negative rods).  Continue IV cefepime.  Follow-up blood culture sensitivity report.    Hematemesis: Resolved.  This is probably from multiple episodes of vomiting.  Improved.  H&H is stable.   Elevated troponins:  Troponin is 172, 138, 127 and 128.  This is likely from demand ischemia.   Paroxysmal atrial fibrillation, PVCs: Heart rate is better.  Continue metoprolol.  We discussed risks, benefits and alternatives to long-term anticoagulation for atrial fibrillation. He is at increased risk for bleeding from long-term anticoagulation because of underlying severe liver and kidney disease. CHA2DS2-VASc score is 3 and he is also at increased risk for stroke. He wants to consider Eliquis for stroke prophylaxis. Will consider starting Eliquis when patient is closer to discharge and it becomes clear that he will not require any additional procedures.   Moderate mitral regurgitation with midrange ejection fraction: 2D echo showed EF estimated at 40 to 45%, moderate LVH, indeterminate left ventricular diastolic parameters, mildly reduced RV systolic function, moderately enlarged RV size, moderately dilated right atrium, small pericardial effusion, moderate mitral regurgitation   ESRD on hemodialysis: Follow-up with nephrologist for hemodialysis.   Dysphagia: Speech therapist recommended dysphagia 2 diet, thin liquids.    Comorbidities include hypertension, hyperlipidemia, BPH, type II DM   Prognosis is guarded   Follow-up with palliative care team.    Diet Order             DIET DYS 2 Fluid consistency: Thin  Diet effective now                            Consultants: General surgeon Interventional radiologist  Procedures: Paracentesis with removal of 1,600 mL of fluid on 11/26/2023    Medications:    amLODipine  10 mg Oral Daily   Chlorhexidine Gluconate Cloth  6 each Topical  Q0600   feeding supplement (NEPRO CARB STEADY)  237 mL Oral BID BM   irbesartan  300 mg Oral Daily   lactulose  20 g Oral BID   metoprolol tartrate  25 mg Oral BID   pantoprazole  40 mg Oral Daily   tamsulosin  0.4 mg Oral Daily   Continuous Infusions:  ceFEPime (MAXIPIME) IV 1 g (11/29/23  2153)     Anti-infectives (From admission, onward)    Start     Dose/Rate Route Frequency Ordered Stop   11/28/23 2200  ceFEPIme (MAXIPIME) 1 g in sodium chloride 0.9 % 100 mL IVPB        1 g 200 mL/hr over 30 Minutes Intravenous Every 24 hours 11/28/23 2120     11/27/23 1300  cefTRIAXone (ROCEPHIN) 2 g in sodium chloride 0.9 % 100 mL IVPB  Status:  Discontinued        2 g 200 mL/hr over 30 Minutes Intravenous Every 24 hours 11/26/23 2027 11/28/23 2120   11/27/23 1200  vancomycin (VANCOREADY) IVPB 1500 mg/300 mL  Status:  Discontinued        1,500 mg 150 mL/hr over 120 Minutes Intravenous  Once 11/27/23 1131 11/27/23 1223   11/26/23 1345  cefTRIAXone (ROCEPHIN) 2 g in sodium chloride 0.9 % 100 mL IVPB        2 g 200 mL/hr over 30 Minutes Intravenous Once 11/26/23 1333 11/26/23 1432   11/26/23 1345  azithromycin (ZITHROMAX) 500 mg in sodium chloride 0.9 % 250 mL IVPB        500 mg 250 mL/hr over 60 Minutes Intravenous  Once 11/26/23 1333 11/26/23 1633              Family Communication/Anticipated D/C date and plan/Code Status   DVT prophylaxis: SCDs Start: 11/26/23 2029     Code Status: Full Code  Family Communication: None Disposition Plan: Plan to discharge home   Status is: Inpatient Remains inpatient appropriate because: Spontaneous bacterial peritonitis       Subjective:   Interval events noted.  He complain of general weakness.  He still has some abdominal pain.  No other complaints.  Objective:    Vitals:   11/30/23 0739 11/30/23 1010 11/30/23 1222 11/30/23 1342  BP: 126/85  114/81 (!) 124/93  Pulse: (!) 57 86 88 86  Resp: 18  18 (!) 21  Temp: 97.9 F (36.6 C)  97.8 F (36.6 C) (!) 97.5 F (36.4 C)  TempSrc:   Oral Oral  SpO2: 100%  96% 100%  Weight:    62.8 kg  Height:       No data found.   Intake/Output Summary (Last 24 hours) at 11/30/2023 1404 Last data filed at 11/29/2023 2045 Gross per 24 hour  Intake 340 ml  Output --  Net  340 ml   Filed Weights   11/27/23 1241 11/27/23 1630 11/30/23 1342  Weight: 65.2 kg 65.2 kg 62.8 kg    Exam:  GEN: NAD SKIN: Warm and dry EYES: No pallor or icterus ENT: MMM CV: Irregular rate and rhythm PULM: CTA B ABD: soft, distended, NT, +BS CNS: AAO x 3, non focal EXT: No edema or tenderness         Data Reviewed:   I have personally reviewed following labs and imaging studies:  Labs: Labs show the following:   Basic Metabolic Panel: Recent Labs  Lab 11/26/23 1122 11/27/23 0421 11/28/23 0827  NA 142 141 137  K 4.5 5.0 4.0  CL  100 102 101  CO2 18* 17* 20*  GLUCOSE 112* 88 66*  BUN 102* 116* 69*  CREATININE 11.92* 12.16* 7.24*  CALCIUM 9.4 8.7* 8.4*   GFR Estimated Creatinine Clearance: 9.3 mL/min (A) (by C-G formula based on SCr of 7.24 mg/dL (H)). Liver Function Tests: Recent Labs  Lab 11/26/23 1122 11/27/23 0421  AST 30 24  ALT 16 14  ALKPHOS 96 70  BILITOT 2.2* 1.8*  PROT 7.5 6.7  ALBUMIN 2.8* 2.4*   Recent Labs  Lab 11/26/23 1122  LIPASE 35   Recent Labs  Lab 11/26/23 1921  AMMONIA 67*   Coagulation profile Recent Labs  Lab 11/26/23 1223  INR 1.9*    CBC: Recent Labs  Lab 11/26/23 1122 11/26/23 2344 11/27/23 0421 11/27/23 1125 11/28/23 0543  WBC 15.8* 15.8* 14.7* 14.0* 13.7*  NEUTROABS 13.4*  --   --   --   --   HGB 11.0* 9.9* 9.7* 9.4* 9.0*  HCT 33.1* 29.9* 28.6* 28.4* 25.4*  MCV 92.5 92.3 90.8 90.2 87.0  PLT 308 246 241 248 205   Cardiac Enzymes: No results for input(s): "CKTOTAL", "CKMB", "CKMBINDEX", "TROPONINI" in the last 168 hours. BNP (last 3 results) No results for input(s): "PROBNP" in the last 8760 hours. CBG: Recent Labs  Lab 11/27/23 0757 11/28/23 0814 11/28/23 1153 11/29/23 0938 11/30/23 0738  GLUCAP 79 68* 70 122* 87   D-Dimer: No results for input(s): "DDIMER" in the last 72 hours. Hgb A1c: No results for input(s): "HGBA1C" in the last 72 hours.  Lipid Profile: No results for  input(s): "CHOL", "HDL", "LDLCALC", "TRIG", "CHOLHDL", "LDLDIRECT" in the last 72 hours.  Thyroid function studies: No results for input(s): "TSH", "T4TOTAL", "T3FREE", "THYROIDAB" in the last 72 hours.  Invalid input(s): "FREET3" Anemia work up: No results for input(s): "VITAMINB12", "FOLATE", "FERRITIN", "TIBC", "IRON", "RETICCTPCT" in the last 72 hours. Sepsis Labs: Recent Labs  Lab 11/26/23 1122 11/26/23 2344 11/27/23 0421 11/27/23 1125 11/28/23 0543  WBC 15.8* 15.8* 14.7* 14.0* 13.7*  LATICACIDVEN 1.7  --   --   --   --     Microbiology Recent Results (from the past 240 hours)  Body fluid culture w Gram Stain     Status: None   Collection Time: 11/24/23  9:43 AM   Specimen: PATH Cytology Peritoneal fluid  Result Value Ref Range Status   Specimen Description   Final    PERITONEAL Performed at [facility], [address], [place], [place] [address]    Special Requests   Final    NONE Performed at [facility], [address] [place], [place] [address]    Gram Stain NO WBC SEEN NO ORGANISMS SEEN   Final   Culture   Final    NO GROWTH 3 DAYS Performed at [facility], [address] [place], [place] [address]    Report Status 11/27/2023 FINAL  Final  Culture, blood (Routine x 2)     Status: None (Preliminary result)   Collection Time: 11/26/23 11:22 AM   Specimen: BLOOD  Result Value Ref Range Status   Specimen Description   Final    BLOOD RIGHT ANTECUBITAL Performed at [facility], [address] [place], [place] [address]    Special Requests   Final    BOTTLES DRAWN AEROBIC AND ANAEROBIC Blood Culture results may not be optimal due to an inadequate volume of blood received in culture bottles Performed at [facility], [address] [place],  [place] [address]    Culture  Setup Time   Final    ANAEROBIC BOTTLE ONLY GRAM NEGATIVE RODS CRITICAL RESULT CALLED TO, READ BACK BY AND VERIFIED WITH: CAROLINE  COULTER @ 11/28/23 2033 AB    Culture   Final    CULTURE REINCUBATED FOR BETTER GROWTH Performed at [facility], [address] [place], [place] [address]    Report Status PENDING  Incomplete  Blood Culture ID Panel (Reflexed)     Status: None   Collection Time: 11/26/23 11:22 AM  Result Value Ref Range Status   Enterococcus faecalis NOT DETECTED NOT DETECTED Final   Enterococcus Faecium NOT DETECTED NOT DETECTED Final   Listeria monocytogenes NOT DETECTED NOT DETECTED Final   Staphylococcus species NOT DETECTED NOT DETECTED Final   Staphylococcus aureus (BCID) NOT DETECTED NOT DETECTED Final   Staphylococcus epidermidis NOT DETECTED NOT DETECTED Final   Staphylococcus lugdunensis NOT DETECTED NOT DETECTED Final   Streptococcus species NOT DETECTED NOT DETECTED Final   Streptococcus agalactiae NOT DETECTED NOT DETECTED Final   Streptococcus pneumoniae NOT DETECTED NOT DETECTED Final   Streptococcus pyogenes NOT DETECTED NOT DETECTED Final   A.calcoaceticus-baumannii NOT DETECTED NOT DETECTED Final   Bacteroides fragilis NOT DETECTED NOT DETECTED Final   Enterobacterales NOT DETECTED NOT DETECTED Final   Enterobacter cloacae complex NOT DETECTED NOT DETECTED Final   Escherichia coli NOT DETECTED NOT DETECTED Final   Klebsiella aerogenes NOT DETECTED NOT DETECTED Final   Klebsiella oxytoca NOT DETECTED NOT DETECTED Final   Klebsiella pneumoniae NOT DETECTED NOT DETECTED Final   Proteus species NOT DETECTED NOT DETECTED Final   Salmonella species NOT DETECTED NOT DETECTED Final   Serratia marcescens NOT DETECTED NOT DETECTED Final   Haemophilus influenzae NOT DETECTED NOT DETECTED Final   Neisseria meningitidis NOT DETECTED NOT DETECTED Final   Pseudomonas aeruginosa NOT DETECTED NOT DETECTED Final   Stenotrophomonas maltophilia NOT DETECTED NOT DETECTED Final   Candida albicans NOT DETECTED NOT DETECTED Final   Candida auris NOT DETECTED NOT DETECTED Final   Candida  glabrata NOT DETECTED NOT DETECTED Final   Candida krusei NOT DETECTED NOT DETECTED Final   Candida parapsilosis NOT DETECTED NOT DETECTED Final   Candida tropicalis NOT DETECTED NOT DETECTED Final   Cryptococcus neoformans/gattii NOT DETECTED NOT DETECTED Final    Comment: Performed at [facility], [address] [place], [place] [address]  Culture, blood (Routine x 2)     Status: Abnormal   Collection Time: 11/26/23 12:23 PM   Specimen: BLOOD LEFT ARM  Result Value Ref Range Status   Specimen Description   Final    BLOOD LEFT ARM Performed at [facility], [address], [place], [place] [address]    Special Requests   Final    BOTTLES DRAWN AEROBIC AND ANAEROBIC Blood Culture results may not be optimal due to an inadequate volume of blood received in culture bottles Performed at [facility], [address] [place], [place] [address]    Culture  Setup Time   Final    GRAM POSITIVE COCCI AEROBIC BOTTLE ONLY CRITICAL RESULT CALLED TO, READ BACK BY AND VERIFIED WITH: BRIANA ALLEY ON 11/27/23 AT 1107 QSD    Culture (A)  Final    STAPHYLOCOCCUS EPIDERMIDIS THE SIGNIFICANCE OF ISOLATING THIS ORGANISM FROM A SINGLE SET OF BLOOD CULTURES WHEN MULTIPLE SETS ARE DRAWN IS UNCERTAIN. PLEASE NOTIFY THE MICROBIOLOGY DEPARTMENT WITHIN ONE WEEK IF SPECIATION AND SENSITIVITIES ARE REQUIRED. Performed at  [facility], [address], [place], [place] [address]    Report Status 11/29/2023 FINAL  Final  Blood Culture ID Panel (Reflexed)     Status: Abnormal   Collection Time: 11/26/23 12:23 PM  Result Value Ref Range Status   Enterococcus faecalis NOT DETECTED NOT DETECTED Final   Enterococcus Faecium NOT DETECTED NOT DETECTED Final   Listeria monocytogenes NOT DETECTED NOT DETECTED Final   Staphylococcus species DETECTED (A) NOT DETECTED Final    Comment: CRITICAL RESULT CALLED TO, READ BACK BY AND VERIFIED WITH: BRIANA ALLEY ON 11/27/23 AT 1107 QSD     Staphylococcus aureus (BCID) NOT DETECTED NOT DETECTED Final   Staphylococcus epidermidis DETECTED (A) NOT DETECTED Final    Comment: CRITICAL RESULT CALLED TO, READ BACK BY AND VERIFIED WITH: BRIANA ALLEY ON 11/27/23 AT 1107 QSD    Staphylococcus lugdunensis NOT DETECTED NOT DETECTED Final   Streptococcus species NOT DETECTED NOT DETECTED Final   Streptococcus agalactiae NOT DETECTED NOT DETECTED Final   Streptococcus pneumoniae NOT DETECTED NOT DETECTED Final   Streptococcus pyogenes NOT DETECTED NOT DETECTED Final   A.calcoaceticus-baumannii NOT DETECTED NOT DETECTED Final   Bacteroides fragilis NOT DETECTED NOT DETECTED Final   Enterobacterales NOT DETECTED NOT DETECTED Final   Enterobacter cloacae complex NOT DETECTED NOT DETECTED Final   Escherichia coli NOT DETECTED NOT DETECTED Final   Klebsiella aerogenes NOT DETECTED NOT DETECTED Final   Klebsiella oxytoca NOT DETECTED NOT DETECTED Final   Klebsiella pneumoniae NOT DETECTED NOT DETECTED Final   Proteus species NOT DETECTED NOT DETECTED Final   Salmonella species NOT DETECTED NOT DETECTED Final   Serratia marcescens NOT DETECTED NOT DETECTED Final   Haemophilus influenzae NOT DETECTED NOT DETECTED Final   Neisseria meningitidis NOT DETECTED NOT DETECTED Final   Pseudomonas aeruginosa NOT DETECTED NOT DETECTED Final   Stenotrophomonas maltophilia NOT DETECTED NOT DETECTED Final   Candida albicans NOT DETECTED NOT DETECTED Final   Candida auris NOT DETECTED NOT DETECTED Final   Candida glabrata NOT DETECTED NOT DETECTED Final   Candida krusei NOT DETECTED NOT DETECTED Final   Candida parapsilosis NOT DETECTED NOT DETECTED Final   Candida tropicalis NOT DETECTED NOT DETECTED Final   Cryptococcus neoformans/gattii NOT DETECTED NOT DETECTED Final   Methicillin resistance mecA/C NOT DETECTED NOT DETECTED Final    Comment: Performed at [facility], [address] [place], [place] [address]  Body fluid culture w Gram  Stain     Status: None   Collection Time: 11/26/23  8:55 PM   Specimen: PATH Cytology Peritoneal fluid  Result Value Ref Range Status   Specimen Description   Final    PERITONEAL CYTO Performed at [facility], [address] [place], [place] [address]    Special Requests   Final    NONE Performed at [facility], [address] [place], [place] [address]    Gram Stain   Final    FEW WBC PRESENT,BOTH PMN AND MONONUCLEAR FEW GRAM POSITIVE COCCI IN CHAINS MODERATE GRAM NEGATIVE RODS FEW GRAM POSITIVE RODS Performed at [facility], [address], [place], [place] [address]    Culture   Final    MODERATE STREPTOCOCCUS MITIS/ORALIS RARE CITROBACTER AMALONATICUS    Report Status 11/30/2023 FINAL  Final   Organism ID, Bacteria STREPTOCOCCUS MITIS/ORALIS  Final   Organism ID, Bacteria CITROBACTER AMALONATICUS  Final      Susceptibility   Citrobacter amalonaticus - MIC*    CEFEPIME <=0.12  SENSITIVE Sensitive     CEFTAZIDIME <=1 SENSITIVE Sensitive     CEFTRIAXONE <=0.25 SENSITIVE Sensitive     CIPROFLOXACIN <=0.25 SENSITIVE Sensitive     GENTAMICIN <=1 SENSITIVE Sensitive     IMIPENEM <=0.25 SENSITIVE Sensitive     TRIMETH/SULFA <=20 SENSITIVE Sensitive     PIP/TAZO <=4 SENSITIVE Sensitive ug/mL    * RARE CITROBACTER AMALONATICUS   Streptococcus mitis/oralis - MIC*    PENICILLIN 0.25 INTERMEDIATE Intermediate     CEFTRIAXONE 0.25 SENSITIVE Sensitive     LEVOFLOXACIN 1 SENSITIVE Sensitive     VANCOMYCIN 0.5 SENSITIVE Sensitive     * MODERATE STREPTOCOCCUS MITIS/ORALIS  Culture, blood (Routine X 2) w Reflex to ID Panel     Status: None (Preliminary result)   Collection Time: 11/28/23  5:43 AM   Specimen: BLOOD LEFT ARM  Result Value Ref Range Status   Specimen Description BLOOD LEFT ARM  Final   Special Requests   Final    BOTTLES DRAWN AEROBIC AND ANAEROBIC Blood Culture results may not be optimal due to an inadequate volume of blood received in  culture bottles   Culture   Final    NO GROWTH 2 DAYS Performed at [facility], [address], [place], [place] [address]    Report Status PENDING  Incomplete  Culture, blood (Routine X 2) w Reflex to ID Panel     Status: None (Preliminary result)   Collection Time: 11/28/23  5:47 AM   Specimen: BLOOD RIGHT HAND  Result Value Ref Range Status   Specimen Description BLOOD RIGHT HAND  Final   Special Requests   Final    BOTTLES DRAWN AEROBIC AND ANAEROBIC Blood Culture results may not be optimal due to an inadequate volume of blood received in culture bottles   Culture   Final    NO GROWTH 2 DAYS Performed at [facility], [address], [place], [place] [address]    Report Status PENDING  Incomplete    Procedures and diagnostic studies:  No results found.              LOS: 4 days   Graci Hulce  [facility]   Pager on [email or web address]. If 7PM-7AM, please contact night-coverage at [email or web address]     11/30/2023, 2:04 PM

## 2023-11-30 NOTE — Progress Notes (Signed)
Daily Progress Note   Patient Name: Casey Franco       Date: 11/30/2023 DOB: 05-Feb-1961  Age: 63 y.o. MRN#: [id number] Attending Physician: Lurene Shadow, MD Primary Care Physician: [facility] Admit Date: 11/26/2023  Reason for Consultation/Follow-up: Establishing goals of care  Subjective: Feels fatigued after working with therapy, now sitting up in chair - c/o abdominal pain and requesting pain medication - RN alerted  Length of Stay: 4  Current Medications: Scheduled Meds:   amLODipine  10 mg Oral Daily   Chlorhexidine Gluconate Cloth  6 each Topical Q0600   feeding supplement (NEPRO CARB STEADY)  237 mL Oral BID BM   irbesartan  300 mg Oral Daily   lactulose  20 g Oral BID   metoprolol tartrate  25 mg Oral BID   pantoprazole  40 mg Oral Daily   tamsulosin  0.4 mg Oral Daily    Continuous Infusions:  ceFEPime (MAXIPIME) IV 1 g (11/29/23 2153)    PRN Meds: acetaminophen, alteplase, alum & mag hydroxide-simeth, heparin, hydrALAZINE, ondansetron (ZOFRAN) IV, oxyCODONE  Physical Exam Constitutional:      General: He is not in acute distress.    Appearance: He is ill-appearing.     Comments: Appears fatigued  Pulmonary:     Effort: Pulmonary effort is normal.  Abdominal:     Comments: distended  Skin:    General: Skin is warm and dry.  Neurological:     Mental Status: He is alert and oriented to person, place, and time.             Vital Signs: BP 114/81 (BP Location: Left Arm)   Pulse 88   Temp 97.8 F (36.6 C) (Oral)   Resp 18   Ht 6' (1.829 m)   Wt 65.2 kg   SpO2 96%   BMI 19.49 kg/m  SpO2: SpO2: 96 % O2 Device: O2 Device: Room Air O2 Flow Rate:    Intake/output summary:  Intake/Output Summary (Last 24 hours) at 11/30/2023 1244 Last data filed  at 11/29/2023 2045 Gross per 24 hour  Intake 340 ml  Output --  Net 340 ml   LBM: Last BM Date : 11/28/23 Baseline Weight: Weight: 68 kg Most recent weight: Weight: 65.2 kg       Palliative Assessment/Data: PPS 50%      Patient Active Problem List   Diagnosis Date Noted   Paroxysmal atrial fibrillation (HCC) 11/29/2023   Spontaneous bacterial peritonitis (HCC) 11/27/2023   Abdominal pain 11/26/2023   Myocardial injury 11/26/2023   Cirrhosis of liver with ascites (HCC) 11/26/2023   Type II diabetes mellitus with renal manifestations (HCC) 11/26/2023   Coffee ground emesis 11/26/2023   Stage 5 chronic kidney disease (HCC) 12/17/2021   Gastric erythema    AVM (arteriovenous malformation) of small bowel, acquired    Polyp of descending colon    ESRD (end stage renal disease) (HCC) 02/04/2021   Essential hypertension 02/04/2021   Anemia of chronic disease 02/04/2021   Chronic hepatitis C without hepatic coma (HCC) 02/04/2021   Secondary hyperparathyroidism of renal origin (HCC) 05/16/2020   History of cocaine use 05/14/2020   History of diabetes mellitus, type II 05/14/2020  Palliative Care Assessment & Plan   HPI: 63 y.o. male  with past medical history of liver cirrhosis, ESRD (HD-MWF), HTN, diabetes, BPH admitted on 11/26/2023 with nausea with vomiting and increased abdominal distention for approximately 3 weeks.   Patient has been diagnosed with SBP after paracentesis.   PMT was consulted to support patient with goals of care discussions.  Assessment: Follow up today with patient - states he thinks he feels better today than prior. Feels very tired after working with therapy, states he needs to rest. Requests medication for abdominal pain - hasn't had oxycodone since night shift.   Poor appetite continues. Tells me he hasn't eaten much today and doesn't feel like it. I offered him his Nepro drink at bedside requesting that he just take some sips throughout the day as  he is able but he declines. Tells me all he wants is ice chips.   We review his conversation with Samara Deist, palliative NP last week. He tells me he recalls this. We review that he stated he wanted his sister to be his HCPOA. I do not see any paperwork at bedside or in chart. Will request spiritual care follow up for [facility] documentation.  I review with patient what his current code status is listed as and what this means for him, he tells me he would like to continue with full code status.   Recommendations/Plan: Ro remain full code/full scope Spiritual care consult to name sister as HCPOA No other PMT needs identified at this time  Code Status: Full code  Care plan was discussed with patient  Thank you for allowing the Palliative Medicine Team to assist in the care of this patient.   Total Time 35 minutes Prolonged Time Billed  no   Time spent includes: Detailed review of medical records (labs, imaging, vital signs), medically appropriate exam, discussion with treatment team, counseling and educating patient, family and/or staff, documenting clinical information, medication management and coordination of care.     *Please note that this is a verbal dictation therefore any spelling or grammatical errors are due to the "[employer]agon [employer] One" system interpretation.  Gerlean Ren, DNP, [facility] Palliative Medicine Team Team Phone # [phone]  Pager [phone]

## 2023-11-30 NOTE — Progress Notes (Signed)
   11/30/23 1300  Spiritual Encounters  Type of Visit Initial;Attempt (pt unavailable)  Reason for visit Advance directives  OnCall Visit No   Chaplain went to visit patient to talk about an advance directive. When chaplain arrived patient was not in his room. Chaplain services remain available for assistance with AD.

## 2023-11-30 NOTE — Progress Notes (Signed)
OT Cancellation Note  Patient Details Name: DEVANTAE BABE MRN: [id number] DOB: [birth year]   Cancelled Treatment:    Reason Eval/Treat Not Completed: Patient at procedure or test/ unavailable OT eval attempted this date; pt out of room for dialysis.  Will attempt again tomorrow.  Danelle Earthly, MS, OTR/L   Otis Dials 11/30/2023, 3:34 PM

## 2023-11-30 NOTE — Progress Notes (Signed)
1700: Patient became confused 45 mins after giving Oxycodone. Patient keeps on talking but is not attempting to pull any contraptions and appears calm. Dr. Thedore Mins was notified and will continue dialysis. Will monitor patient closely.

## 2023-11-30 NOTE — Progress Notes (Signed)
[facility]  ROUNDING NOTE   Subjective:   Casey Franco is a 63 y.o. male with past medical history of HTN, ESRD, DM-2, and Hep C. Patient presents to emergency department with abdominal pain. He has been admitted for Intractable vomiting [R11.10] Abdominal pain [R10.9] Pneumonia of both lungs due to infectious organism, unspecified part of lung [J18.9] Sepsis without acute organ dysfunction, due to unspecified organism [facility]) [A41.9]  Patient is known to our practice and receives outpatient dialysis at [facility] on a MWF, last treatment on Tuesday.    Patient seen sitting up in chair Complains of mild discomfort from being in the bed so long  Scheduled for dialysis later today   Objective:  Vital signs in last 24 hours:  Temp:  [97.5 F (36.4 C)-98.5 F (36.9 C)] 97.5 F (36.4 C) (02/17 1342) Pulse Rate:  [57-100] 86 (02/17 1408) Resp:  [15-21] 17 (02/17 1408) BP: (114-126)/(81-93) 119/87 (02/17 1408) SpO2:  [94 %-100 %] 94 % (02/17 1408) Weight:  [62.8 kg] 62.8 kg (02/17 1342)  Weight change:  Filed Weights   11/27/23 1241 11/27/23 1630 11/30/23 1342  Weight: 65.2 kg 65.2 kg 62.8 kg    Intake/Output: I/O last 3 completed shifts: In: 340 [P.O.:240; IV Piggyback:100] Out: -    Intake/Output this shift:  No intake/output data recorded.  Physical Exam: General: NAD frail  Head: Normocephalic, atraumatic. Moist oral mucosal membranes  Eyes: Anicteric  Lungs:  Clear to auscultation, normal effort  Heart: Regular rate and rhythm  Abdomen:  Soft, nontender, mild distension  Extremities: No peripheral edema.  Neurologic: Alert and oriented, moving all four extremities  Skin: No lesions  Access: Right chest tunneled catheter    Basic Metabolic Panel: Recent Labs  Lab 11/26/23 1122 11/27/23 0421 11/28/23 0827  NA 142 141 137  K 4.5 5.0 4.0  CL 100 102 101  CO2 18* 17* 20*  GLUCOSE 112* 88 66*  BUN 102* 116* 69*  CREATININE 11.92*  12.16* 7.24*  CALCIUM 9.4 8.7* 8.4*    Liver Function Tests: Recent Labs  Lab 11/26/23 1122 11/27/23 0421  AST 30 24  ALT 16 14  ALKPHOS 96 70  BILITOT 2.2* 1.8*  PROT 7.5 6.7  ALBUMIN 2.8* 2.4*   Recent Labs  Lab 11/26/23 1122  LIPASE 35   Recent Labs  Lab 11/26/23 1921  AMMONIA 67*    CBC: Recent Labs  Lab 11/26/23 1122 11/26/23 2344 11/27/23 0421 11/27/23 1125 11/28/23 0543 11/30/23 1407  WBC 15.8* 15.8* 14.7* 14.0* 13.7* 11.5*  NEUTROABS 13.4*  --   --   --   --   --   HGB 11.0* 9.9* 9.7* 9.4* 9.0* 8.9*  HCT 33.1* 29.9* 28.6* 28.4* 25.4* 26.9*  MCV 92.5 92.3 90.8 90.2 87.0 91.2  PLT 308 246 241 248 205 190    Cardiac Enzymes: No results for input(s): "CKTOTAL", "CKMB", "CKMBINDEX", "TROPONINI" in the last 168 hours.  BNP: Invalid input(s): "POCBNP"  CBG: Recent Labs  Lab 11/27/23 0757 11/28/23 0814 11/28/23 1153 11/29/23 0938 11/30/23 0738  GLUCAP 79 68* 70 122* 87    Microbiology: Results for orders placed or performed during the hospital encounter of 11/26/23  Culture, blood (Routine x 2)     Status: None (Preliminary result)   Collection Time: 11/26/23 11:22 AM   Specimen: BLOOD  Result Value Ref Range Status   Specimen Description   Final    BLOOD RIGHT ANTECUBITAL Performed at [facility], [address] [place], [place] [address]    Special Requests   Final    BOTTLES DRAWN AEROBIC AND ANAEROBIC Blood Culture results may not be optimal due to an inadequate volume of blood received in culture bottles Performed at [facility], [address] [place], [place] [address]    Culture  Setup Time   Final    ANAEROBIC BOTTLE ONLY GRAM NEGATIVE RODS CRITICAL RESULT CALLED TO, READ BACK BY AND VERIFIED WITH: CAROLINE COULTER @ 11/28/23 2033 AB    Culture   Final    CULTURE REINCUBATED FOR BETTER GROWTH Performed at [facility], [address], [place], [place] [address]    Report Status PENDING   Incomplete  Blood Culture ID Panel (Reflexed)     Status: None   Collection Time: 11/26/23 11:22 AM  Result Value Ref Range Status   Enterococcus faecalis NOT DETECTED NOT DETECTED Final   Enterococcus Faecium NOT DETECTED NOT DETECTED Final   Listeria monocytogenes NOT DETECTED NOT DETECTED Final   Staphylococcus species NOT DETECTED NOT DETECTED Final   Staphylococcus aureus (BCID) NOT DETECTED NOT DETECTED Final   Staphylococcus epidermidis NOT DETECTED NOT DETECTED Final   Staphylococcus lugdunensis NOT DETECTED NOT DETECTED Final   Streptococcus species NOT DETECTED NOT DETECTED Final   Streptococcus agalactiae NOT DETECTED NOT DETECTED Final   Streptococcus pneumoniae NOT DETECTED NOT DETECTED Final   Streptococcus pyogenes NOT DETECTED NOT DETECTED Final   A.calcoaceticus-baumannii NOT DETECTED NOT DETECTED Final   Bacteroides fragilis NOT DETECTED NOT DETECTED Final   Enterobacterales NOT DETECTED NOT DETECTED Final   Enterobacter cloacae complex NOT DETECTED NOT DETECTED Final   Escherichia coli NOT DETECTED NOT DETECTED Final   Klebsiella aerogenes NOT DETECTED NOT DETECTED Final   Klebsiella oxytoca NOT DETECTED NOT DETECTED Final   Klebsiella pneumoniae NOT DETECTED NOT DETECTED Final   Proteus species NOT DETECTED NOT DETECTED Final   Salmonella species NOT DETECTED NOT DETECTED Final   Serratia marcescens NOT DETECTED NOT DETECTED Final   Haemophilus influenzae NOT DETECTED NOT DETECTED Final   Neisseria meningitidis NOT DETECTED NOT DETECTED Final   Pseudomonas aeruginosa NOT DETECTED NOT DETECTED Final   Stenotrophomonas maltophilia NOT DETECTED NOT DETECTED Final   Candida albicans NOT DETECTED NOT DETECTED Final   Candida auris NOT DETECTED NOT DETECTED Final   Candida glabrata NOT DETECTED NOT DETECTED Final   Candida krusei NOT DETECTED NOT DETECTED Final   Candida parapsilosis NOT DETECTED NOT DETECTED Final   Candida tropicalis NOT DETECTED NOT DETECTED Final    Cryptococcus neoformans/gattii NOT DETECTED NOT DETECTED Final    Comment: Performed at [facility], [address] [place], [place] [address]  Culture, blood (Routine x 2)     Status: Abnormal   Collection Time: 11/26/23 12:23 PM   Specimen: BLOOD LEFT ARM  Result Value Ref Range Status   Specimen Description   Final    BLOOD LEFT ARM Performed at [facility], [address], [place], [place] [address]    Special Requests   Final    BOTTLES DRAWN AEROBIC AND ANAEROBIC Blood Culture results may not be optimal due to an inadequate volume of blood received in culture bottles Performed at [facility], [address] [place], [place] [address]    Culture  Setup Time   Final    GRAM POSITIVE COCCI AEROBIC BOTTLE ONLY CRITICAL RESULT CALLED TO, READ BACK BY AND VERIFIED WITH: BRIANA ALLEY ON 11/27/23 AT  1107 QSD    Culture (A)  Final    STAPHYLOCOCCUS EPIDERMIDIS THE SIGNIFICANCE OF ISOLATING THIS ORGANISM FROM A SINGLE SET OF BLOOD CULTURES WHEN MULTIPLE SETS ARE DRAWN IS UNCERTAIN. PLEASE NOTIFY THE MICROBIOLOGY DEPARTMENT WITHIN ONE WEEK IF SPECIATION AND SENSITIVITIES ARE REQUIRED. Performed at [facility], [address] [place], [place] [address]    Report Status 11/29/2023 FINAL  Final  Blood Culture ID Panel (Reflexed)     Status: Abnormal   Collection Time: 11/26/23 12:23 PM  Result Value Ref Range Status   Enterococcus faecalis NOT DETECTED NOT DETECTED Final   Enterococcus Faecium NOT DETECTED NOT DETECTED Final   Listeria monocytogenes NOT DETECTED NOT DETECTED Final   Staphylococcus species DETECTED (A) NOT DETECTED Final    Comment: CRITICAL RESULT CALLED TO, READ BACK BY AND VERIFIED WITH: BRIANA ALLEY ON 11/27/23 AT 1107 QSD    Staphylococcus aureus (BCID) NOT DETECTED NOT DETECTED Final   Staphylococcus epidermidis DETECTED (A) NOT DETECTED Final    Comment: CRITICAL RESULT CALLED TO, READ BACK BY AND VERIFIED WITH: BRIANA ALLEY  ON 11/27/23 AT 1107 QSD    Staphylococcus lugdunensis NOT DETECTED NOT DETECTED Final   Streptococcus species NOT DETECTED NOT DETECTED Final   Streptococcus agalactiae NOT DETECTED NOT DETECTED Final   Streptococcus pneumoniae NOT DETECTED NOT DETECTED Final   Streptococcus pyogenes NOT DETECTED NOT DETECTED Final   A.calcoaceticus-baumannii NOT DETECTED NOT DETECTED Final   Bacteroides fragilis NOT DETECTED NOT DETECTED Final   Enterobacterales NOT DETECTED NOT DETECTED Final   Enterobacter cloacae complex NOT DETECTED NOT DETECTED Final   Escherichia coli NOT DETECTED NOT DETECTED Final   Klebsiella aerogenes NOT DETECTED NOT DETECTED Final   Klebsiella oxytoca NOT DETECTED NOT DETECTED Final   Klebsiella pneumoniae NOT DETECTED NOT DETECTED Final   Proteus species NOT DETECTED NOT DETECTED Final   Salmonella species NOT DETECTED NOT DETECTED Final   Serratia marcescens NOT DETECTED NOT DETECTED Final   Haemophilus influenzae NOT DETECTED NOT DETECTED Final   Neisseria meningitidis NOT DETECTED NOT DETECTED Final   Pseudomonas aeruginosa NOT DETECTED NOT DETECTED Final   Stenotrophomonas maltophilia NOT DETECTED NOT DETECTED Final   Candida albicans NOT DETECTED NOT DETECTED Final   Candida auris NOT DETECTED NOT DETECTED Final   Candida glabrata NOT DETECTED NOT DETECTED Final   Candida krusei NOT DETECTED NOT DETECTED Final   Candida parapsilosis NOT DETECTED NOT DETECTED Final   Candida tropicalis NOT DETECTED NOT DETECTED Final   Cryptococcus neoformans/gattii NOT DETECTED NOT DETECTED Final   Methicillin resistance mecA/C NOT DETECTED NOT DETECTED Final    Comment: Performed at [facility], [address] [place], [place] [address]  Body fluid culture w Gram Stain     Status: None   Collection Time: 11/26/23  8:55 PM   Specimen: PATH Cytology Peritoneal fluid  Result Value Ref Range Status   Specimen Description   Final    PERITONEAL CYTO Performed at [facility], [address] [place], [place] [address]    Special Requests   Final    NONE Performed at [facility], [address] [place], [place] [address]    Gram Stain   Final    FEW WBC PRESENT,BOTH PMN AND MONONUCLEAR FEW GRAM POSITIVE COCCI IN CHAINS MODERATE GRAM NEGATIVE RODS FEW GRAM POSITIVE RODS Performed at [facility], [address] [place], [place] [address]    Culture   Final  MODERATE STREPTOCOCCUS MITIS/ORALIS RARE CITROBACTER AMALONATICUS    Report Status 11/30/2023 FINAL  Final   Organism ID, Bacteria STREPTOCOCCUS MITIS/ORALIS  Final   Organism ID, Bacteria CITROBACTER AMALONATICUS  Final      Susceptibility   Citrobacter amalonaticus - MIC*    CEFEPIME <=0.12 SENSITIVE Sensitive     CEFTAZIDIME <=1 SENSITIVE Sensitive     CEFTRIAXONE <=0.25 SENSITIVE Sensitive     CIPROFLOXACIN <=0.25 SENSITIVE Sensitive     GENTAMICIN <=1 SENSITIVE Sensitive     IMIPENEM <=0.25 SENSITIVE Sensitive     TRIMETH/SULFA <=20 SENSITIVE Sensitive     PIP/TAZO <=4 SENSITIVE Sensitive ug/mL    * RARE CITROBACTER AMALONATICUS   Streptococcus mitis/oralis - MIC*    PENICILLIN 0.25 INTERMEDIATE Intermediate     CEFTRIAXONE 0.25 SENSITIVE Sensitive     LEVOFLOXACIN 1 SENSITIVE Sensitive     VANCOMYCIN 0.5 SENSITIVE Sensitive     * MODERATE STREPTOCOCCUS MITIS/ORALIS  Culture, blood (Routine X 2) w Reflex to ID Panel     Status: None (Preliminary result)   Collection Time: 11/28/23  5:43 AM   Specimen: BLOOD LEFT ARM  Result Value Ref Range Status   Specimen Description BLOOD LEFT ARM  Final   Special Requests   Final    BOTTLES DRAWN AEROBIC AND ANAEROBIC Blood Culture results may not be optimal due to an inadequate volume of blood received in culture bottles   Culture   Final    NO GROWTH 2 DAYS Performed at [facility], [address], [place], [place] [address]    Report Status PENDING  Incomplete  Culture, blood (Routine X 2) w  Reflex to ID Panel     Status: None (Preliminary result)   Collection Time: 11/28/23  5:47 AM   Specimen: BLOOD RIGHT HAND  Result Value Ref Range Status   Specimen Description BLOOD RIGHT HAND  Final   Special Requests   Final    BOTTLES DRAWN AEROBIC AND ANAEROBIC Blood Culture results may not be optimal due to an inadequate volume of blood received in culture bottles   Culture   Final    NO GROWTH 2 DAYS Performed at [facility], [address] [place], [place] [address]    Report Status PENDING  Incomplete    Coagulation Studies: No results for input(s): "LABPROT", "INR" in the last 72 hours.   Urinalysis: No results for input(s): "COLORURINE", "LABSPEC", "PHURINE", "GLUCOSEU", "HGBUR", "BILIRUBINUR", "KETONESUR", "PROTEINUR", "UROBILINOGEN", "NITRITE", "LEUKOCYTESUR" in the last 72 hours.  Invalid input(s): "APPERANCEUR"    Imaging: No results found.    Medications:    ceFEPime (MAXIPIME) IV 1 g (11/29/23 2153)    amLODipine  10 mg Oral Daily   Chlorhexidine Gluconate Cloth  6 each Topical Q0600   feeding supplement (NEPRO CARB STEADY)  237 mL Oral BID BM   irbesartan  300 mg Oral Daily   lactulose  20 g Oral BID   metoprolol tartrate  25 mg Oral BID   pantoprazole  40 mg Oral Daily   tamsulosin  0.4 mg Oral Daily   acetaminophen, alteplase, alum & mag hydroxide-simeth, heparin, hydrALAZINE, ondansetron (ZOFRAN) IV, oxyCODONE  Assessment/ Plan:  Mr. Casey Franco is a 63 y.o.  male with HTN, ESRD, DM-2, Hep C.  Patient presents with chest pain and has been admitted for Intractable vomiting [R11.10] Abdominal pain [R10.9] Pneumonia of both lungs due to infectious organism, unspecified part of lung [J18.9] Sepsis without acute organ dysfunction, due to unspecified organism (  HCC) [A41.9]  [facility]/MWF/right chest PermCath/71.0 kg  End-stage renal disease on hemodialysis.  Dialysis scheduled for later today.  2. Anemia of  chronic kidney disease Lab Results  Component Value Date   HGB 8.9 (L) 11/30/2023    Patient does receive Mircera at outpatient clinic.  Will order low dose EPO with dialysis  3. Secondary Hyperparathyroidism: with outpatient labs: PTH 351, phosphorus 6.6, calcium 8.1 on 11/23/2023.   Lab Results  Component Value Date   PTH 31 08/30/2018   CALCIUM 8.4 (L) 11/28/2023   PHOS 11.2 (H) 02/04/2021    Patient prescribed calcium acetate outpatient. Awaiting lab draws with dialysis  4.  Hypertension with chronic kidney disease.  Home regimen includes furosemide, hydralazine, amlodipine, and irbesartan. Blood pressure remains stable  5.  Cirrhosis/ascites, spontaneous bacterial peritonitis. Paracentesis from 11/27/2023 yielded 1.6 L of fluid.  Complex loculations suspected as more ascites present. Microbiology results show Staph epidermidis in the blood. Peritoneal fluid culture from 11/26/2023 shows Streptococcus and Citrobacter. Currently getting broad-spectrum antibiotics cefepime. Also getting lactulose twice a day. Will monitor for need at discharge.    LOS: 4 Carmichael Burdette 2/17/20252:20 PM

## 2023-11-30 NOTE — Progress Notes (Signed)
CC: bact peritonitis Subjective: Doing better, no fever, minimal abd discomfort  Objective: Vital signs in last 24 hours: Temp:  [97.8 F (36.6 C)-98.8 F (37.1 C)] 97.8 F (36.6 C) (02/17 1222) Pulse Rate:  [57-100] 88 (02/17 1222) Resp:  [15-18] 18 (02/17 1222) BP: (109-126)/(79-91) 114/81 (02/17 1222) SpO2:  [94 %-100 %] 96 % (02/17 1222) Last BM Date : 11/28/23  Intake/Output from previous day: 02/16 0701 - 02/17 0700 In: 340 [P.O.:240; IV Piggyback:100] Out: -  Intake/Output this shift: No intake/output data recorded.  Physical exam:  Chronically ill, malnourished Abd: ascites, minimal tenderness w/o peritonitis or rebound.  Lab Results: CBC  Recent Labs    11/28/23 0543  WBC 13.7*  HGB 9.0*  HCT 25.4*  PLT 205   BMET Recent Labs    11/28/23 0827  NA 137  K 4.0  CL 101  CO2 20*  GLUCOSE 66*  BUN 69*  CREATININE 7.24*  CALCIUM 8.4*   PT/INR No results for input(s): "LABPROT", "INR" in the last 72 hours. ABG No results for input(s): "PHART", "HCO3" in the last 72 hours.  Invalid input(s): "PCO2", "PO2"  Studies/Results: No results found.  Anti-infectives: Anti-infectives (From admission, onward)    Start     Dose/Rate Route Frequency Ordered Stop   11/28/23 2200  ceFEPIme (MAXIPIME) 1 g in sodium chloride 0.9 % 100 mL IVPB        1 g 200 mL/hr over 30 Minutes Intravenous Every 24 hours 11/28/23 2120     11/27/23 1300  cefTRIAXone (ROCEPHIN) 2 g in sodium chloride 0.9 % 100 mL IVPB  Status:  Discontinued        2 g 200 mL/hr over 30 Minutes Intravenous Every 24 hours 11/26/23 2027 11/28/23 2120   11/27/23 1200  vancomycin (VANCOREADY) IVPB 1500 mg/300 mL  Status:  Discontinued        1,500 mg 150 mL/hr over 120 Minutes Intravenous  Once 11/27/23 1131 11/27/23 1223   11/26/23 1345  cefTRIAXone (ROCEPHIN) 2 g in sodium chloride 0.9 % 100 mL IVPB        2 g 200 mL/hr over 30 Minutes Intravenous Once 11/26/23 1333 11/26/23 1432   11/26/23  1345  azithromycin (ZITHROMAX) 500 mg in sodium chloride 0.9 % 250 mL IVPB        500 mg 250 mL/hr over 60 Minutes Intravenous  Once 11/26/23 1333 11/26/23 1633       Assessment/Plan:  Bact peritonitis No need for surgical intervention Continue a/bs and serial paracentesis We will be available Prognosis very poor given advanced cirrhosis , malnutrition and Renal failure  Sterling Big, MD, FACS  11/30/2023

## 2023-11-30 NOTE — Evaluation (Signed)
Physical Therapy Evaluation Patient Details Name: Casey Franco MRN: [id number] DOB: [birth year] Today's Date: 11/30/2023  History of Present Illness  presented to ER secondary to abdominal pain, chest pain and coffee-ground emesis; admitted for management of spontaneous bacterial peritonitis.  s/p paracentesis with 1.6L removed on 2/13  Clinical Impression  Patient sleeping in bed upon arrival to room; generally disheveled in appearance, juice/water spilled throughout bed and on floor (appears to have fallen asleep while drinking?).  Does easily awaken to voice, light touch; oriented to self only.  Inconsistently follows simple commands; near constant cuing for redirection to, recall of task at hand. Generally weak and deconditioned throughout all extremities; no focal weakness appreciated. Endorses generalized soreness throughout flank/back; unable to rate (FACES 3-4/10).  Currently requiring min/mod assist for bed mobility; min assist for sit/stand, standing balance and bed/chair transfer.  Requires constant cuing for hand placement, walker position and managemetn; increased postural sway, all directions, with standing activities. Does require +1 hands-on assist at all times. Patient generally impulsive and unaware of safety/assist needs; very high fall risk. Recommend close chair follow for progressive mobility efforts in subsequent sessions. Would benefit from skilled PT to address above deficits and promote optimal return to PLOF.; recommend post-acute PT follow up as indicated by interdisciplinary care team.            If plan is discharge home, recommend the following: A lot of help with walking and/or transfers;A lot of help with bathing/dressing/bathroom   Can travel by private vehicle   Yes    Equipment Recommendations Rolling walker (2 wheels)  Recommendations for Other Services       Functional Status Assessment Patient has had a recent decline in their functional status and  demonstrates the ability to make significant improvements in function in a reasonable and predictable amount of time.     Precautions / Restrictions Precautions Precautions: Fall Restrictions Weight Bearing Restrictions Per Provider Order: No      Mobility  Bed Mobility Overal bed mobility: Needs Assistance Bed Mobility: Supine to Sit     Supine to sit: Min assist          Transfers Overall transfer level: Needs assistance Equipment used: Rolling walker (2 wheels) Transfers: Sit to/from Stand, Bed to chair/wheelchair/BSC Sit to Stand: Min assist Stand pivot transfers: Min assist         General transfer comment: constant cuing for hand placement, walker position and managemetn; increased postural sway, all directions, with standing activities.  Does require +1 hands-on assist at all times.  Patient generally impulsive and unaware of safety/assist needs; very high fall risk.    Ambulation/Gait               General Gait Details: deferred this date due to confusion, fatigue with basic mobility  Stairs            Wheelchair Mobility     Tilt Bed    Modified Rankin (Stroke Patients Only)       Balance Overall balance assessment: Needs assistance Sitting-balance support: Feet supported, No upper extremity supported Sitting balance-Leahy Scale: Fair     Standing balance support: Bilateral upper extremity supported Standing balance-Leahy Scale: Poor                               Pertinent Vitals/Pain Pain Assessment Pain Assessment: No/denies pain    Home Living Family/patient expects to be discharged to:: Private residence  Additional Comments: Patient indicates that he lives with his sister (?) and is home alone during the day.  However, generally confused with marked difficulty completing interview; will verify with family as available    Prior Function               Mobility Comments: Patient  indicates that he ambulates without assist device, drives to/from dialysis. However, generally confused with marked difficulty completing interview; will verify with family as available       Extremity/Trunk Assessment   Upper Extremity Assessment Upper Extremity Assessment: Generalized weakness    Lower Extremity Assessment Lower Extremity Assessment: Generalized weakness (grossly 4-/5 throughout)       Communication        Cognition Arousal: Lethargic (easily awakens and maintains alertness with directly engaged, quickly closes eyes and back to sleep with not directly engaged) Behavior During Therapy: Impulsive   PT - Cognitive impairments: No family/caregiver present to determine baseline                       PT - Cognition Comments: Oriented to self only; inconsistently follows simple commands, frequent cuing for redirection to task.  Generally impulsive, unaware of deficits and need for assist         Cueing       General Comments      Exercises     Assessment/Plan    PT Assessment Patient needs continued PT services  PT Problem List Decreased strength;Decreased balance;Decreased mobility;Decreased coordination;Decreased cognition;Decreased knowledge of use of DME;Decreased activity tolerance;Decreased safety awareness;Decreased knowledge of precautions       PT Treatment Interventions DME instruction;Gait training;Stair training;Functional mobility training;Therapeutic activities;Therapeutic exercise;Balance training;Cognitive remediation;Patient/family education    PT Goals (Current goals can be found in the Care Plan section)  Acute Rehab PT Goals PT Goal Formulation: Patient unable to participate in goal setting Time For Goal Achievement: 12/14/23 Potential to Achieve Goals: Fair    Frequency Min 1X/week     Co-evaluation               AM-PAC PT "6 Clicks" Mobility  Outcome Measure Help needed turning from your back to your side  while in a flat bed without using bedrails?: None Help needed moving from lying on your back to sitting on the side of a flat bed without using bedrails?: None Help needed moving to and from a bed to a chair (including a wheelchair)?: A Little Help needed standing up from a chair using your arms (e.g., wheelchair or bedside chair)?: A Little Help needed to walk in hospital room?: A Little Help needed climbing 3-5 steps with a railing? : A Little 6 Click Score: 20    End of Session   Activity Tolerance: Patient tolerated treatment well Patient left: in chair;with call bell/phone within reach;with chair alarm set Nurse Communication: Mobility status PT Visit Diagnosis: Difficulty in walking, not elsewhere classified (R26.2);Muscle weakness (generalized) (M62.81)    Time: 4259-5638 PT Time Calculation (min) (ACUTE ONLY): 16 min   Charges:   PT Evaluation $PT Eval Moderate Complexity: 1 Mod   PT General Charges $$ ACUTE PT VISIT: 1 Visit        Vivan Agostino H. Manson Passey, PT, DPT, NCS 11/30/23, 1:13 PM [phone]

## 2023-11-30 NOTE — Care Management Important Message (Signed)
Important Message  Patient Details  Name: Casey Franco MRN: [id number] Date of Birth: 1960-10-19   Important Message Given:  Yes - Medicare IM     Durrell Barajas W, CMA 11/30/2023, 10:16 AM

## 2023-11-30 NOTE — Progress Notes (Signed)
Hemodialysis note  Received patient in bed to unit. Alert and oriented.  Informed consent signed and in chart.  Treatment initiated: 1408 Treatment completed: 1747  Patient tolerated well. Transported back to room, alert without acute distress. Patient is oriented x1 but appears calm.  Report given to patient's RN.   Access used: Right Chest HD Catheter Access issues: none  Total UF removed: 0 Medication(s) given:  Oxycodone 5 mg tab PO  Post HD weight: 62.8 kg   Wolfgang [facility]

## 2023-11-30 NOTE — Plan of Care (Signed)

## 2023-12-01 ENCOUNTER — Inpatient Hospital Stay: Payer: 59

## 2023-12-01 DIAGNOSIS — K652 Spontaneous bacterial peritonitis: Secondary | ICD-10-CM | POA: Diagnosis not present

## 2023-12-01 LAB — CULTURE, BLOOD (ROUTINE X 2)

## 2023-12-01 LAB — BODY FLUID CELL COUNT WITH DIFFERENTIAL
Neutrophil Count, Fluid: 100 %
Total Nucleated Cell Count, Fluid: 4687 uL

## 2023-12-01 LAB — GLUCOSE, CAPILLARY
Glucose-Capillary: 72 mg/dL (ref 70–99)
Glucose-Capillary: 80 mg/dL (ref 70–99)

## 2023-12-01 LAB — PATHOLOGIST SMEAR REVIEW

## 2023-12-01 MED ORDER — LIDOCAINE HCL (PF) 1 % IJ SOLN
10.0000 mL | Freq: Once | INTRAMUSCULAR | Status: AC
  Administered 2023-12-01: 10 mL via SUBCUTANEOUS
  Filled 2023-12-01: qty 10

## 2023-12-01 MED ORDER — METRONIDAZOLE 500 MG/100ML IV SOLN
500.0000 mg | Freq: Two times a day (BID) | INTRAVENOUS | Status: DC
  Administered 2023-12-01 – 2023-12-04 (×8): 500 mg via INTRAVENOUS
  Filled 2023-12-01 (×9): qty 100

## 2023-12-01 NOTE — Progress Notes (Signed)
OT Cancellation Note  Patient Details Name: HARVIS MABUS MRN: [id number] DOB: 1961-05-25   Cancelled Treatment:    Reason Eval/Treat Not Completed: Patient at procedure or test/ unavailable. Pt off the floor appears to be at ultrasound. Will attempt at later date/time as available.  Constance Goltz 12/01/2023, 10:41 AM

## 2023-12-01 NOTE — Procedures (Signed)
PROCEDURE SUMMARY:  Successful US guided paracentesis from left lower abdomen.  Yielded 4.6 liters of opaque yellow fluid.  No immediate complications.  Patient tolerated well.  EBL = trace  Specimen sent for labs.  Alecxis Baltzell Charmian Muff PA-C 12/01/2023 10:33 AM

## 2023-12-01 NOTE — TOC Initial Note (Addendum)
Transition of Care [facility]) - Initial/Assessment Note    Patient Details  Name: Casey Franco MRN: [id number] Date of Birth: Feb 28, 1961  [facility] CM/SW Contact:    Truddie Hidden, RN Phone Number: 12/01/2023, 10:52 AM  Clinical Narrative:                 Attempt to reach patient's sister, Elenor Quinones to discuss discharge planning and recommendation for skilled. No answer. Unable to leave a message.   11:05am New PASSR obtained. FL2 completed.           Patient Goals and CMS Choice            Expected Discharge Plan and Services                                              Prior Living Arrangements/Services                       Activities of Daily Living   ADL Screening (condition at time of admission) Independently performs ADLs?: Yes (appropriate for developmental age) Is the patient deaf or have difficulty hearing?: No Does the patient have difficulty seeing, even when wearing glasses/contacts?: No Does the patient have difficulty concentrating, remembering, or making decisions?: No  Permission Sought/Granted                  Emotional Assessment              Admission diagnosis:  Intractable vomiting [R11.10] Abdominal pain [R10.9] Pneumonia of both lungs due to infectious organism, unspecified part of lung [J18.9] Sepsis without acute organ dysfunction, due to unspecified organism [facility]) [A41.9] Patient Active Problem List   Diagnosis Date Noted   Sepsis without acute organ dysfunction (HCC) 11/30/2023   Paroxysmal atrial fibrillation (HCC) 11/29/2023   Spontaneous bacterial peritonitis (HCC) 11/27/2023   Abdominal pain 11/26/2023   Myocardial injury 11/26/2023   Cirrhosis of liver with ascites (HCC) 11/26/2023   Type II diabetes mellitus with renal manifestations (HCC) 11/26/2023   Coffee ground emesis 11/26/2023   Stage 5 chronic kidney disease (HCC) 12/17/2021   Gastric erythema    AVM (arteriovenous  malformation) of small bowel, acquired    Polyp of descending colon    ESRD (end stage renal disease) (HCC) 02/04/2021   Essential hypertension 02/04/2021   Anemia of chronic disease 02/04/2021   Chronic hepatitis C without hepatic coma (HCC) 02/04/2021   Secondary hyperparathyroidism of renal origin (HCC) 05/16/2020   History of cocaine use 05/14/2020   History of diabetes mellitus, type II 05/14/2020   PCP:  [facility] [facility] [employer] Pharmacy:   [facility] - [place], [place] - [address] [place] [address] Phone: [phone] Fax: [phone]     Social Drivers of Health (SDOH) Social History: SDOH Screenings   Food Insecurity: Patient Declined (11/28/2023)  Housing: Patient Unable To Answer (11/28/2023)  Transportation Needs: Patient Declined (11/28/2023)  Utilities: Not At Risk (11/28/2023)  Financial Resource Strain: Low Risk  (05/15/2020)   Received from [facility]  Tobacco Use: Medium Risk (11/26/2023)   SDOH Interventions:     Readmission Risk Interventions     No data to display

## 2023-12-01 NOTE — Plan of Care (Signed)
  Problem: Nutrition: Goal: Adequate nutrition will be maintained Outcome: Progressing   Problem: Safety: Goal: Ability to remain free from injury will improve Outcome: Progressing   Problem: Education: Goal: Knowledge of General Education information will improve Description: Including pain rating scale, medication(s)/side effects and non-pharmacologic comfort measures Outcome: Not Progressing   Problem: Health Behavior/Discharge Planning: Goal: Ability to manage health-related needs will improve Outcome: Not Progressing   Problem: Activity: Goal: Risk for activity intolerance will decrease Outcome: Not Progressing   Problem: Coping: Goal: Level of anxiety will decrease Outcome: Not Progressing   Problem: Skin Integrity: Goal: Risk for impaired skin integrity will decrease Outcome: Not Progressing

## 2023-12-01 NOTE — Discharge Planning (Signed)
ESTABLISHED HEMODIALYSIS Outpatient Facility [facility]  [address] [place] [address] [phone]   Schedule: MWF 11:00  Casey Franco Dialysis Coordinator II  [facility] Cell: [phone] eFax: [phone] [email or web address]

## 2023-12-01 NOTE — Progress Notes (Addendum)
Progress Note    Casey Franco  IRJ:[id number] DOB: 10/18/1960  DOA: 11/26/2023 PCP: [facility]      Brief Narrative:    Medical records reviewed and are as summarized below:  Casey Franco is a 63 y.o. 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  He also complained of nausea, increasing abdominal distention and multiple episodes of vomiting.      Assessment/Plan:   Principal Problem:   Spontaneous bacterial peritonitis (HCC) Active Problems:   Abdominal pain   Cirrhosis of liver with ascites (HCC)   Myocardial injury   Essential hypertension   ESRD (end stage renal disease) (HCC)   Coffee ground emesis   Paroxysmal atrial fibrillation (HCC)   Sepsis without acute organ dysfunction (HCC)    Body mass index is 18.78 kg/m.   Spontaneous bacterial peritonitis, abdominal pain, liver cirrhosis with ascites: S/p paracentesis with removal of 1600 mL of fluid on 11/26/2023.  Fluid positive for SBP (ascitic fluid total nucleated cells 1,326, percentage neutrophils was 68% and absolute PMN count 901) Ascitic fluid culture showed Streptococcus mitis and Citrobacter amalonaticus Ordered repeat paracentesis.  S/p repeat paracentesis on 12/01/2023 with removal of 4.6 L of fluid. Percentage neutrophil count was 100 and total nucleated cell count 4,687. Repeat ascitic fluid culture. He was evaluated by the general surgeon.  No indication for surgery at this time.    Staph epidermidis bacteremia: 1 out of 4 bottles from 11/26/2023 positive for Staph epidermidis.  This is likely a contaminant.  Repeat blood cultures from 11/27/2023 has not shown any growth thus far.  Bacteroides  thetaiotaomicron bacteremia (blood cultures from 11/26/2023 growing gram-negative rods).  Continue  IV cefepime.  IV Flagyl has been added.  Discussed with [name], ID pharmacist.   Hematemesis: Resolved.  This is probably from multiple episodes of vomiting.  Improved.  H&H is stable.   Elevated troponins: Troponin is 172, 138, 127 and 128.  This is likely from demand ischemia.   Paroxysmal atrial fibrillation, PVCs: Heart rate is better.  Continue metoprolol.  We discussed risks, benefits and alternatives to long-term anticoagulation for atrial fibrillation. He is at increased risk for bleeding from long-term anticoagulation because of underlying severe liver and kidney disease. CHA2DS2-VASc score is 3 and he is also at increased risk for stroke. He wants to consider Eliquis for stroke prophylaxis. Will consider starting Eliquis when patient is closer to discharge and it becomes clear that he will not require any additional procedures.   Moderate mitral regurgitation with midrange ejection fraction: 2D echo showed EF estimated at 40 to 45%, moderate LVH, indeterminate left ventricular diastolic parameters, mildly reduced RV systolic function, moderately enlarged RV size, moderately dilated right atrium, small pericardial effusion, moderate mitral regurgitation   Confusion on 11/30/2023, likely acute toxic encephalopathy from oxycodone: Mental status is better today.  Oxycodone has been discontinued for now.   ESRD on hemodialysis: Follow-up with nephrologist for hemodialysis.   Dysphagia: Speech therapist recommended dysphagia 2 diet, thin liquids.   General weakness: PT recommended discharge to SNF.  Follow-up with TOC to assist with placement.   Comorbidities include hypertension, hyperlipidemia, BPH, type II DM   Prognosis is guarded   Follow-up with palliative care team.    Diet Order  DIET DYS 2 Fluid consistency: Thin  Diet effective now                            Consultants: General surgeon Interventional  radiologist  Procedures: Paracentesis with removal of 1,600 mL of fluid on 11/26/2023 Paracentesis with removal of 4.6 L of fluid on 12/01/2023    Medications:    amLODipine  10 mg Oral Daily   Chlorhexidine Gluconate Cloth  6 each Topical Q0600   feeding supplement (NEPRO CARB STEADY)  237 mL Oral BID BM   irbesartan  300 mg Oral Daily   lactulose  20 g Oral BID   metoprolol tartrate  25 mg Oral BID   pantoprazole  40 mg Oral Daily   tamsulosin  0.4 mg Oral Daily   Continuous Infusions:  ceFEPime (MAXIPIME) IV 1 g (11/30/23 2233)     Anti-infectives (From admission, onward)    Start     Dose/Rate Route Frequency Ordered Stop   11/28/23 2200  ceFEPIme (MAXIPIME) 1 g in sodium chloride 0.9 % 100 mL IVPB        1 g 200 mL/hr over 30 Minutes Intravenous Every 24 hours 11/28/23 2120     11/27/23 1300  cefTRIAXone (ROCEPHIN) 2 g in sodium chloride 0.9 % 100 mL IVPB  Status:  Discontinued        2 g 200 mL/hr over 30 Minutes Intravenous Every 24 hours 11/26/23 2027 11/28/23 2120   11/27/23 1200  vancomycin (VANCOREADY) IVPB 1500 mg/300 mL  Status:  Discontinued        1,500 mg 150 mL/hr over 120 Minutes Intravenous  Once 11/27/23 1131 11/27/23 1223   11/26/23 1345  cefTRIAXone (ROCEPHIN) 2 g in sodium chloride 0.9 % 100 mL IVPB        2 g 200 mL/hr over 30 Minutes Intravenous Once 11/26/23 1333 11/26/23 1432   11/26/23 1345  azithromycin (ZITHROMAX) 500 mg in sodium chloride 0.9 % 250 mL IVPB        500 mg 250 mL/hr over 60 Minutes Intravenous  Once 11/26/23 1333 11/26/23 1633              Family Communication/Anticipated D/C date and plan/Code Status   DVT prophylaxis: SCDs Start: 11/26/23 2029     Code Status: Full Code  Family Communication: None Disposition Plan: Plan to discharge home   Status is: Inpatient Remains inpatient appropriate because: Spontaneous bacterial peritonitis       Subjective:   Interval events noted.  He complains of  abdominal pain.  Chart review shows that he was confused at dialysis yesterday after getting oxycodone.  Patient has no recollection of this.  Objective:    Vitals:   12/01/23 0723 12/01/23 0955 12/01/23 1040 12/01/23 1125  BP: 115/69 109/76 110/75 109/69  Pulse: 83 92 83 94  Resp: 17   18  Temp: 98.9 F (37.2 C)   97.8 F (36.6 C)  TempSrc:      SpO2: 97% 98% 100% 100%  Weight:      Height:       No data found.   Intake/Output Summary (Last 24 hours) at 12/01/2023 1220 Last data filed at 11/30/2023 1750 Gross per 24 hour  Intake --  Output 0 ml  Net 0 ml   Filed Weights   11/27/23 1630 11/30/23 1342 11/30/23 1750  Weight: 65.2 kg 62.8 kg 62.8 kg    Exam:  GEN: NAD SKIN:  Warm and dry EYES: No pallor or icterus ENT: MMM CV: Irregular rate and rhythm PULM: CTA B ABD: soft, distended, tenderness without rebound tenderness or guarding, +BS CNS: AAO x 3, non focal EXT: No edema or tenderness        Data Reviewed:   I have personally reviewed following labs and imaging studies:  Labs: Labs show the following:   Basic Metabolic Panel: Recent Labs  Lab 11/26/23 1122 11/27/23 0421 11/28/23 0827 11/30/23 1407  NA 142 141 137 137  K 4.5 5.0 4.0 4.7  CL 100 102 101 96*  CO2 18* 17* 20* 16*  GLUCOSE 112* 88 66* 93  BUN 102* 116* 69* 94*  CREATININE 11.92* 12.16* 7.24* 9.72*  CALCIUM 9.4 8.7* 8.4* 8.6*  PHOS  --   --   --  5.8*   GFR Estimated Creatinine Clearance: 6.9 mL/min (A) (by C-G formula based on SCr of 9.72 mg/dL (H)). Liver Function Tests: Recent Labs  Lab 11/26/23 1122 11/27/23 0421 11/30/23 1407  AST 30 24  --   ALT 16 14  --   ALKPHOS 96 70  --   BILITOT 2.2* 1.8*  --   PROT 7.5 6.7  --   ALBUMIN 2.8* 2.4* 2.4*   Recent Labs  Lab 11/26/23 1122  LIPASE 35   Recent Labs  Lab 11/26/23 1921  AMMONIA 67*   Coagulation profile Recent Labs  Lab 11/26/23 1223  INR 1.9*    CBC: Recent Labs  Lab 11/26/23 1122  11/26/23 2344 11/27/23 0421 11/27/23 1125 11/28/23 0543 11/30/23 1407  WBC 15.8* 15.8* 14.7* 14.0* 13.7* 11.5*  NEUTROABS 13.4*  --   --   --   --   --   HGB 11.0* 9.9* 9.7* 9.4* 9.0* 8.9*  HCT 33.1* 29.9* 28.6* 28.4* 25.4* 26.9*  MCV 92.5 92.3 90.8 90.2 87.0 91.2  PLT 308 246 241 248 205 190   Cardiac Enzymes: No results for input(s): "CKTOTAL", "CKMB", "CKMBINDEX", "TROPONINI" in the last 168 hours. BNP (last 3 results) No results for input(s): "PROBNP" in the last 8760 hours. CBG: Recent Labs  Lab 11/28/23 0814 11/28/23 1153 11/29/23 0938 11/30/23 0738 12/01/23 0724  GLUCAP 68* 70 122* 87 72   D-Dimer: No results for input(s): "DDIMER" in the last 72 hours. Hgb A1c: No results for input(s): "HGBA1C" in the last 72 hours.  Lipid Profile: No results for input(s): "CHOL", "HDL", "LDLCALC", "TRIG", "CHOLHDL", "LDLDIRECT" in the last 72 hours.  Thyroid function studies: No results for input(s): "TSH", "T4TOTAL", "T3FREE", "THYROIDAB" in the last 72 hours.  Invalid input(s): "FREET3" Anemia work up: No results for input(s): "VITAMINB12", "FOLATE", "FERRITIN", "TIBC", "IRON", "RETICCTPCT" in the last 72 hours. Sepsis Labs: Recent Labs  Lab 11/26/23 1122 11/26/23 2344 11/27/23 0421 11/27/23 1125 11/28/23 0543 11/30/23 1407  WBC 15.8*   < > 14.7* 14.0* 13.7* 11.5*  LATICACIDVEN 1.7  --   --   --   --   --    < > = values in this interval not displayed.    Microbiology Recent Results (from the past 240 hours)  Body fluid culture w Gram Stain     Status: None   Collection Time: 11/24/23  9:43 AM   Specimen: PATH Cytology Peritoneal fluid  Result Value Ref Range Status   Specimen Description   Final    PERITONEAL Performed at [facility], [address], [place], [place] [address]    Special Requests   Final  NONE Performed at [facility], [address] [place], [place] [address]    Gram Stain NO WBC SEEN NO ORGANISMS SEEN    Final   Culture   Final    NO GROWTH 3 DAYS Performed at [facility], [address] [place], [place] [address]    Report Status 11/27/2023 FINAL  Final  Culture, blood (Routine x 2)     Status: None (Preliminary result)   Collection Time: 11/26/23 11:22 AM   Specimen: BLOOD  Result Value Ref Range Status   Specimen Description   Final    BLOOD RIGHT ANTECUBITAL Performed at [facility], [address] [place], [place] [address]    Special Requests   Final    BOTTLES DRAWN AEROBIC AND ANAEROBIC Blood Culture results may not be optimal due to an inadequate volume of blood received in culture bottles Performed at [facility], [address] [place], [place] [address]    Culture  Setup Time   Final    ANAEROBIC BOTTLE ONLY GRAM NEGATIVE RODS CRITICAL RESULT CALLED TO, READ BACK BY AND VERIFIED WITH: CAROLINE COULTER @ 11/28/23 2033 AB    Culture   Final    CULTURE REINCUBATED FOR BETTER GROWTH Performed at [facility], [address], [place], [place] [address]    Report Status PENDING  Incomplete  Blood Culture ID Panel (Reflexed)     Status: None   Collection Time: 11/26/23 11:22 AM  Result Value Ref Range Status   Enterococcus faecalis NOT DETECTED NOT DETECTED Final   Enterococcus Faecium NOT DETECTED NOT DETECTED Final   Listeria monocytogenes NOT DETECTED NOT DETECTED Final   Staphylococcus species NOT DETECTED NOT DETECTED Final   Staphylococcus aureus (BCID) NOT DETECTED NOT DETECTED Final   Staphylococcus epidermidis NOT DETECTED NOT DETECTED Final   Staphylococcus lugdunensis NOT DETECTED NOT DETECTED Final   Streptococcus species NOT DETECTED NOT DETECTED Final   Streptococcus agalactiae NOT DETECTED NOT DETECTED Final   Streptococcus pneumoniae NOT DETECTED NOT DETECTED Final   Streptococcus pyogenes NOT DETECTED NOT DETECTED Final   A.calcoaceticus-baumannii NOT DETECTED NOT DETECTED Final   Bacteroides fragilis NOT DETECTED  NOT DETECTED Final   Enterobacterales NOT DETECTED NOT DETECTED Final   Enterobacter cloacae complex NOT DETECTED NOT DETECTED Final   Escherichia coli NOT DETECTED NOT DETECTED Final   Klebsiella aerogenes NOT DETECTED NOT DETECTED Final   Klebsiella oxytoca NOT DETECTED NOT DETECTED Final   Klebsiella pneumoniae NOT DETECTED NOT DETECTED Final   Proteus species NOT DETECTED NOT DETECTED Final   Salmonella species NOT DETECTED NOT DETECTED Final   Serratia marcescens NOT DETECTED NOT DETECTED Final   Haemophilus influenzae NOT DETECTED NOT DETECTED Final   Neisseria meningitidis NOT DETECTED NOT DETECTED Final   Pseudomonas aeruginosa NOT DETECTED NOT DETECTED Final   Stenotrophomonas maltophilia NOT DETECTED NOT DETECTED Final   Candida albicans NOT DETECTED NOT DETECTED Final   Candida auris NOT DETECTED NOT DETECTED Final   Candida glabrata NOT DETECTED NOT DETECTED Final   Candida krusei NOT DETECTED NOT DETECTED Final   Candida parapsilosis NOT DETECTED NOT DETECTED Final   Candida tropicalis NOT DETECTED NOT DETECTED Final   Cryptococcus neoformans/gattii NOT DETECTED NOT DETECTED Final    Comment: Performed at [facility], [address] [place], [place] [address]  Culture, blood (Routine x 2)     Status: Abnormal   Collection Time: 11/26/23 12:23 PM   Specimen: BLOOD LEFT ARM  Result Value Ref Range Status   Specimen Description   Final    BLOOD LEFT ARM Performed at [facility], [address] [place], [place] [address]    Special Requests   Final    BOTTLES DRAWN AEROBIC AND ANAEROBIC Blood Culture results may not be optimal due to an inadequate volume of blood received in culture bottles Performed at [facility], [address] [place], [place] [address]    Culture  Setup Time   Final    GRAM POSITIVE COCCI AEROBIC BOTTLE ONLY CRITICAL RESULT CALLED TO, READ BACK BY AND VERIFIED WITH: BRIANA ALLEY ON 11/27/23 AT 1107 QSD    Culture (A)   Final    STAPHYLOCOCCUS EPIDERMIDIS THE SIGNIFICANCE OF ISOLATING THIS ORGANISM FROM A SINGLE SET OF BLOOD CULTURES WHEN MULTIPLE SETS ARE DRAWN IS UNCERTAIN. PLEASE NOTIFY THE MICROBIOLOGY DEPARTMENT WITHIN ONE WEEK IF SPECIATION AND SENSITIVITIES ARE REQUIRED. Performed at [facility], [address], [place], [place] [address]    Report Status 11/29/2023 FINAL  Final  Blood Culture ID Panel (Reflexed)     Status: Abnormal   Collection Time: 11/26/23 12:23 PM  Result Value Ref Range Status   Enterococcus faecalis NOT DETECTED NOT DETECTED Final   Enterococcus Faecium NOT DETECTED NOT DETECTED Final   Listeria monocytogenes NOT DETECTED NOT DETECTED Final   Staphylococcus species DETECTED (A) NOT DETECTED Final    Comment: CRITICAL RESULT CALLED TO, READ BACK BY AND VERIFIED WITH: BRIANA ALLEY ON 11/27/23 AT 1107 QSD    Staphylococcus aureus (BCID) NOT DETECTED NOT DETECTED Final   Staphylococcus epidermidis DETECTED (A) NOT DETECTED Final    Comment: CRITICAL RESULT CALLED TO, READ BACK BY AND VERIFIED WITH: BRIANA ALLEY ON 11/27/23 AT 1107 QSD    Staphylococcus lugdunensis NOT DETECTED NOT DETECTED Final   Streptococcus species NOT DETECTED NOT DETECTED Final   Streptococcus agalactiae NOT DETECTED NOT DETECTED Final   Streptococcus pneumoniae NOT DETECTED NOT DETECTED Final   Streptococcus pyogenes NOT DETECTED NOT DETECTED Final   A.calcoaceticus-baumannii NOT DETECTED NOT DETECTED Final   Bacteroides fragilis NOT DETECTED NOT DETECTED Final   Enterobacterales NOT DETECTED NOT DETECTED Final   Enterobacter cloacae complex NOT DETECTED NOT DETECTED Final   Escherichia coli NOT DETECTED NOT DETECTED Final   Klebsiella aerogenes NOT DETECTED NOT DETECTED Final   Klebsiella oxytoca NOT DETECTED NOT DETECTED Final   Klebsiella pneumoniae NOT DETECTED NOT DETECTED Final   Proteus species NOT DETECTED NOT DETECTED Final   Salmonella species NOT DETECTED NOT DETECTED Final    Serratia marcescens NOT DETECTED NOT DETECTED Final   Haemophilus influenzae NOT DETECTED NOT DETECTED Final   Neisseria meningitidis NOT DETECTED NOT DETECTED Final   Pseudomonas aeruginosa NOT DETECTED NOT DETECTED Final   Stenotrophomonas maltophilia NOT DETECTED NOT DETECTED Final   Candida albicans NOT DETECTED NOT DETECTED Final   Candida auris NOT DETECTED NOT DETECTED Final   Candida glabrata NOT DETECTED NOT DETECTED Final   Candida krusei NOT DETECTED NOT DETECTED Final   Candida parapsilosis NOT DETECTED NOT DETECTED Final   Candida tropicalis NOT DETECTED NOT DETECTED Final   Cryptococcus neoformans/gattii NOT DETECTED NOT DETECTED Final   Methicillin resistance mecA/C NOT DETECTED NOT DETECTED Final    Comment: Performed at [facility], [address] [place], [place] [address]  Body fluid culture w Gram Stain     Status: None   Collection Time: 11/26/23  8:55 PM   Specimen: PATH  Cytology Peritoneal fluid  Result Value Ref Range Status   Specimen Description   Final    PERITONEAL CYTO Performed at [facility], [address] [place], [place] [address]    Special Requests   Final    NONE Performed at [facility], [address] [place], [place] [address]    Gram Stain   Final    FEW WBC PRESENT,BOTH PMN AND MONONUCLEAR FEW GRAM POSITIVE COCCI IN CHAINS MODERATE GRAM NEGATIVE RODS FEW GRAM POSITIVE RODS Performed at [facility], [address] [place], [place] [address]    Culture   Final    MODERATE STREPTOCOCCUS MITIS/ORALIS RARE CITROBACTER AMALONATICUS    Report Status 11/30/2023 FINAL  Final   Organism ID, Bacteria STREPTOCOCCUS MITIS/ORALIS  Final   Organism ID, Bacteria CITROBACTER AMALONATICUS  Final      Susceptibility   Citrobacter amalonaticus - MIC*    CEFEPIME <=0.12 SENSITIVE Sensitive     CEFTAZIDIME <=1 SENSITIVE Sensitive     CEFTRIAXONE <=0.25 SENSITIVE Sensitive     CIPROFLOXACIN <=0.25 SENSITIVE  Sensitive     GENTAMICIN <=1 SENSITIVE Sensitive     IMIPENEM <=0.25 SENSITIVE Sensitive     TRIMETH/SULFA <=20 SENSITIVE Sensitive     PIP/TAZO <=4 SENSITIVE Sensitive ug/mL    * RARE CITROBACTER AMALONATICUS   Streptococcus mitis/oralis - MIC*    PENICILLIN 0.25 INTERMEDIATE Intermediate     CEFTRIAXONE 0.25 SENSITIVE Sensitive     LEVOFLOXACIN 1 SENSITIVE Sensitive     VANCOMYCIN 0.5 SENSITIVE Sensitive     * MODERATE STREPTOCOCCUS MITIS/ORALIS  Culture, blood (Routine X 2) w Reflex to ID Panel     Status: None (Preliminary result)   Collection Time: 11/28/23  5:43 AM   Specimen: BLOOD LEFT ARM  Result Value Ref Range Status   Specimen Description BLOOD LEFT ARM  Final   Special Requests   Final    BOTTLES DRAWN AEROBIC AND ANAEROBIC Blood Culture results may not be optimal due to an inadequate volume of blood received in culture bottles   Culture   Final    NO GROWTH 3 DAYS Performed at [facility], [address] [place], [place] [address]    Report Status PENDING  Incomplete  Culture, blood (Routine X 2) w Reflex to ID Panel     Status: None (Preliminary result)   Collection Time: 11/28/23  5:47 AM   Specimen: BLOOD RIGHT HAND  Result Value Ref Range Status   Specimen Description BLOOD RIGHT HAND  Final   Special Requests   Final    BOTTLES DRAWN AEROBIC AND ANAEROBIC Blood Culture results may not be optimal due to an inadequate volume of blood received in culture bottles   Culture   Final    NO GROWTH 3 DAYS Performed at [facility], 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 Found to have spontaneous bacterial peritonitis. Request for diagnostic and therapeutic  paracentesis. EXAM: ULTRASOUND GUIDED left PARACENTESIS MEDICATIONS: 1% lidocaine, 10mL. COMPLICATIONS: None immediate. PROCEDURE: Informed written consent was obtained from the patient after a discussion of the risks, benefits and alternatives to treatment. A timeout was performed prior to the initiation of the procedure. Initial ultrasound scanning  demonstrates a large amount of ascites within the left lower abdominal quadrant. The left lower abdomen was prepped and draped in the usual sterile fashion. 1% lidocaine was used for local anesthesia. Following this, a 6 Fr Safe-T-Centesis catheter was introduced. An ultrasound image was saved for documentation purposes. The paracentesis was performed. The catheter was removed and a dressing was applied. The patient tolerated the procedure well without immediate post procedural complication. FINDINGS: A total of approximately 4.6L of opaque, yellow fluid was removed. Samples were sent to the laboratory as requested by the clinical team. IMPRESSION: Successful ultrasound-guided paracentesis yielding 4.6 liters of peritoneal fluid. Procedure Performed by: Sherlene Shams, PA-C Electronically Signed   By: Malachy Moan M.D.   On: 12/01/2023 11:27                LOS: 5 days   Peggy Monk  [facility]   Pager on [email or web address]. If 7PM-7AM, please contact night-coverage at [email or web address]     12/01/2023, 12:20 PM

## 2023-12-01 NOTE — Plan of Care (Signed)

## 2023-12-01 NOTE — [place] FL2 (Signed)
[place] MEDICAID FL2 LEVEL OF CARE FORM     IDENTIFICATION  Patient Name: Casey Franco Birthdate: [birth year] Sex: male Admission Date (Current Location): 11/26/2023  [facility] and [place] Number:  Chiropodist and Address:  [facility], [address] [place], [place] [address]      Provider Number: [id number]  Attending Physician Name and Address:  Lurene Shadow, MD  Relative Name and Phone Number:  Beni, Turrell (Sister)  [phone] [facility])    Current Level of Care: Hospital Recommended Level of Care: Skilled Nursing Facility Prior Approval Number:    Date Approved/Denied:   PASRR Number: [id number]  Discharge Plan: SNF    Current Diagnoses: Patient Active Problem List   Diagnosis Date Noted   Sepsis without acute organ dysfunction (HCC) 11/30/2023   Paroxysmal atrial fibrillation (HCC) 11/29/2023   Spontaneous bacterial peritonitis (HCC) 11/27/2023   Abdominal pain 11/26/2023   Myocardial injury 11/26/2023   Cirrhosis of liver with ascites (HCC) 11/26/2023   Type II diabetes mellitus with renal manifestations (HCC) 11/26/2023   Coffee ground emesis 11/26/2023   Stage 5 chronic kidney disease (HCC) 12/17/2021   Gastric erythema    AVM (arteriovenous malformation) of small bowel, acquired    Polyp of descending colon    ESRD (end stage renal disease) (HCC) 02/04/2021   Essential hypertension 02/04/2021   Anemia of chronic disease 02/04/2021   Chronic hepatitis C without hepatic coma (HCC) 02/04/2021   Secondary hyperparathyroidism of renal origin (HCC) 05/16/2020   History of cocaine use 05/14/2020   History of diabetes mellitus, type II 05/14/2020    Orientation RESPIRATION BLADDER Height & Weight     Self, Place  Normal  (Oliguria. ERSD- Dialysis MWF) Weight: 62.8 kg Height:  6' (182.9 cm)  BEHAVIORAL SYMPTOMS/MOOD NEUROLOGICAL BOWEL NUTRITION STATUS  Other (Comment) (n/a)  (n/a) Continent Diet (DYS 2)  AMBULATORY  STATUS COMMUNICATION OF NEEDS Skin   Limited Assist Verbally Normal, Other (Comment) (Dry, Flaky)                       Personal Care Assistance Level of Assistance  Bathing, Dressing Bathing Assistance: Limited assistance   Dressing Assistance: Limited assistance     Functional Limitations Info  Sight Sight Info: Impaired        SPECIAL CARE FACTORS FREQUENCY  PT (By licensed PT), OT (By licensed OT)     PT Frequency: Min 2x weekly OT Frequency: Min 2x weekly            Contractures Contractures Info: Not present    Additional Factors Info  Code Status, Allergies Code Status Info: FULL Allergies Info: No Known Allergies           Current Medications (12/01/2023):  This is the current hospital active medication list Current Facility-Administered Medications  Medication Dose Route Frequency Provider Last Rate Last Admin   acetaminophen (TYLENOL) tablet 650 mg  650 mg Oral Q6H PRN Lorretta Harp, MD       alum & mag hydroxide-simeth (MAALOX/MYLANTA) 200-200-20 MG/5ML suspension 30 mL  30 mL Oral Q4H PRN Lurene Shadow, MD   30 mL at 11/30/23 0034   amLODipine (NORVASC) tablet 10 mg  10 mg Oral Daily Lorretta Harp, MD   10 mg at 12/01/23 1057   ceFEPIme (MAXIPIME) 1 g in sodium chloride 0.9 % 100 mL IVPB  1 g Intravenous Q24H Manuela Schwartz, NP 200 mL/hr at 11/30/23 2233 1 g at 11/30/23 2233  Chlorhexidine Gluconate Cloth 2 % PADS 6 each  6 each Topical Q0600 Wendee Beavers, NP   6 each at 11/30/23 0500   feeding supplement (NEPRO CARB STEADY) liquid 237 mL  237 mL Oral BID BM Breeze, Shantelle, NP   237 mL at 12/01/23 1058   hydrALAZINE (APRESOLINE) injection 5 mg  5 mg Intravenous Q2H PRN Lorretta Harp, MD   5 mg at 11/26/23 2125   irbesartan (AVAPRO) tablet 300 mg  300 mg Oral Daily Lorretta Harp, MD   300 mg at 12/01/23 1057   lactulose (CHRONULAC) 10 GM/15ML solution 20 g  20 g Oral BID Lorretta Harp, MD   20 g at 12/01/23 1057   metoprolol tartrate (LOPRESSOR) tablet  25 mg  25 mg Oral BID Lurene Shadow, MD   25 mg at 12/01/23 1057   ondansetron (ZOFRAN) injection 4 mg  4 mg Intravenous Q8H PRN Lorretta Harp, MD   4 mg at 11/30/23 0359   pantoprazole (PROTONIX) EC tablet 40 mg  40 mg Oral Daily Lurene Shadow, MD   40 mg at 12/01/23 1057   tamsulosin (FLOMAX) capsule 0.4 mg  0.4 mg Oral Daily Lorretta Harp, MD   0.4 mg at 12/01/23 1057     Discharge Medications: Please see discharge summary for a list of discharge medications.  Relevant Imaging Results:  Relevant Lab Results:   Additional Information SSN# [id number]  Truddie Hidden, [place]

## 2023-12-01 NOTE — Evaluation (Signed)
Occupational Therapy Evaluation Patient Details Name: Casey Franco MRN: [id number] DOB: 1961-01-21 Today's Date: 12/01/2023   History of Present Illness   presented to ER secondary to abdominal pain, chest pain and coffee-ground emesis; admitted for management of spontaneous bacterial peritonitis.  s/p paracentesis with 1.6L removed on 2/13     Clinical Impressions Pt was seen for OT evaluation this date. Prior to hospital admission, pt reports being IND with mobility using no AD, IND with ADLs and able to drive himself to dialysis, get groceries, etc. Pt is confused and seems to be a poor historian. He stated name and location, but needed to use his phone to determine the date/year. Reports he lives in a "rooming house" that he pays rent to someone for.   Pt presents to acute OT demonstrating impaired ADL performance and functional mobility 2/2 weakness, decreased cognition, and balance deficits (See OT problem list for additional functional deficits). Pt currently requires SUP for all bed mobility using bed rails with cueing for initiation then pt otherwise impulsive, standing prior to being told, etc. Min/CGA for STS to RW with linens fully soiled from liquid BM and additional BM in the floor at bedside. Max A for peri-care in standing at [facility] with pt able to maintain balance with CGA and perform anterior hygiene. Full linen changed and pt able to lateral step to [facility] with RW and Min/CGA constantly to prevent LOB. Socks soiled, therefore changed at bed level requiring Min/Mod A-pt performed on L foot and OT assisted with R foot. He still appears confused-unsure of baseline.  Pt would benefit from skilled OT services to address noted impairments and functional limitations (see below for any additional details) in order to maximize safety and independence while minimizing falls risk and caregiver burden. Do anticipate the need for follow up OT services upon acute hospital DC.        If plan is  discharge home, recommend the following:   A little help with walking and/or transfers;A lot of help with bathing/dressing/bathroom;Assistance with cooking/housework;Direct supervision/assist for financial management;Supervision due to cognitive status;Direct supervision/assist for medications management;Help with stairs or ramp for entrance     Functional Status Assessment   Patient has had a recent decline in their functional status and demonstrates the ability to make significant improvements in function in a reasonable and predictable amount of time.     Equipment Recommendations   Other (comment) (defer)     Recommendations for Other Services         Precautions/Restrictions   Precautions Precautions: Fall Recall of Precautions/Restrictions: Impaired Restrictions Weight Bearing Restrictions Per Provider Order: No     Mobility Bed Mobility Overal bed mobility: Needs Assistance Bed Mobility: Supine to Sit, Sit to Supine     Supine to sit: Supervision, HOB elevated, Used rails Sit to supine: Supervision   General bed mobility comments: increased time to initate, but then pt impulsive    Transfers Overall transfer level: Needs assistance Equipment used: Rolling walker (2 wheels) Transfers: Sit to/from Stand Sit to Stand: Contact guard assist, Min assist           General transfer comment: Min/CGA for STS from EOB to RW with impulsivity noted; able to lateral step to [facility] with RW and MIN A constant for cueing; stood for extensive time and maintained balance at RW with constant CGA during peri-care      Balance Overall balance assessment: Needs assistance Sitting-balance support: Feet supported, No upper extremity supported Sitting balance-Leahy Scale: Fair  Standing balance support: Bilateral upper extremity supported, Reliant on assistive device for balance Standing balance-Leahy Scale: Poor Standing balance comment: constant CGA to Min A                            ADL either performed or assessed with clinical judgement   ADL Overall ADL's : Needs assistance/impaired                 Upper Body Dressing : Minimal assistance;Sitting Upper Body Dressing Details (indicate cue type and reason): at EOB to manage tele Lower Body Dressing: Moderate assistance;Bed level Lower Body Dressing Details (indicate cue type and reason): able to don socks at bed level with Min/Mod A     Toileting- Clothing Manipulation and Hygiene: Moderate assistance;Maximal assistance;Sit to/from stand Toileting - Clothing Manipulation Details (indicate cue type and reason): Max A for peri-care to buttocks while standing at EOB with RW and pt able to perform anterior hygiene with CGA from therapist             Vision         Perception         Praxis         Pertinent Vitals/Pain Pain Assessment Pain Assessment: No/denies pain     Extremity/Trunk Assessment Upper Extremity Assessment Upper Extremity Assessment: Overall WFL for tasks assessed   Lower Extremity Assessment Lower Extremity Assessment: Generalized weakness       Communication Communication Communication: No apparent difficulties   Cognition Arousal: Alert Behavior During Therapy: Impulsive Cognition: Cognition impaired, Difficult to assess   Orientation impairments: Time, Situation Awareness: Intellectual awareness impaired     Executive functioning impairment (select all impairments): Initiation                   Following commands: Impaired Following commands impaired: Follows one step commands with increased time     Cueing  General Comments   Cueing Techniques: Verbal cues;Tactile cues;Visual cues;Gestural cues      Exercises Other Exercises Other Exercises: Edu on role of OT in acute setting.   Shoulder Instructions      Home Living Family/patient expects to be discharged to:: Private residence Living Arrangements: Alone    Type of Home: Apartment Home Access: Stairs to enter Entrance Stairs-Number of Steps: 2 Entrance Stairs-Rails: None Home Layout: One level     Bathroom Shower/Tub: Tub/shower unit         Home Equipment: Pharmacist, hospital (2 wheels)          Prior Functioning/Environment Prior Level of Function : Patient poor historian/Family not available             Mobility Comments: reports no AD use for mobility ADLs Comments: reports IND at baseline; however remains confused; states he lives in a "rooming house" reports paying rent    OT Problem List: Decreased strength;Decreased activity tolerance;Decreased cognition;Decreased safety awareness;Impaired balance (sitting and/or standing)   OT Treatment/Interventions: Self-care/ADL training;Therapeutic exercise;Balance training;DME and/or AE instruction;Patient/family education;Therapeutic activities      OT Goals(Current goals can be found in the care plan section)   Acute Rehab OT Goals Patient Stated Goal: get stronger OT Goal Formulation: With patient Time For Goal Achievement: 12/15/23 Potential to Achieve Goals: Fair ADL Goals Pt Will Perform Upper Body Bathing: with set-up;sitting Pt Will Perform Lower Body Bathing: with supervision;sit to/from stand;sitting/lateral leans;with set-up Pt Will Perform Lower Body Dressing: with supervision;with set-up;sit to/from stand;sitting/lateral leans Pt  Will Transfer to Toilet: with supervision;ambulating;regular height toilet Pt Will Perform Toileting - Clothing Manipulation and hygiene: with modified independence;sitting/lateral leans;sit to/from stand   OT Frequency:  Min 1X/week    Co-evaluation              AM-PAC OT "6 Clicks" Daily Activity     Outcome Measure Help from another person eating meals?: None Help from another person taking care of personal grooming?: None Help from another person toileting, which includes using toliet, bedpan, or urinal?: A  Lot Help from another person bathing (including washing, rinsing, drying)?: A Lot Help from another person to put on and taking off regular upper body clothing?: A Little Help from another person to put on and taking off regular lower body clothing?: A Lot 6 Click Score: 17   End of Session Equipment Utilized During Treatment: Rolling walker (2 wheels) Nurse Communication: Mobility status  Activity Tolerance: Patient tolerated treatment well Patient left: in bed;with call bell/phone within reach;with bed alarm set  OT Visit Diagnosis: Other abnormalities of gait and mobility (R26.89);Unsteadiness on feet (R26.81);Muscle weakness (generalized) (M62.81)                Time: 1610-9604 OT Time Calculation (min): 31 min Charges:  OT General Charges $OT Visit: 1 Visit OT Evaluation $OT Eval Moderate Complexity: 1 Mod OT Treatments $Self Care/Home Management : 8-22 mins Declan Adamson, OTR/L  12/01/23, 2:45 PM  Geraldean Walen E Tayva Easterday 12/01/2023, 2:37 PM

## 2023-12-01 NOTE — Progress Notes (Signed)
[facility]  ROUNDING NOTE   Subjective:   Casey Franco is a 63 y.o. male with past medical history of HTN, ESRD, DM-2, and Hep C. Patient presents to emergency department with abdominal pain. He has been admitted for Intractable vomiting [R11.10] Abdominal pain [R10.9] Pneumonia of both lungs due to infectious organism, unspecified part of lung [J18.9] Sepsis without acute organ dysfunction, due to unspecified organism [facility]) [A41.9]  Patient is known to our practice and receives outpatient dialysis at [facility] on a MWF, last treatment on Tuesday.    Patient seen after paracentesis today Reports over 4L removed.  Denies abd pain Breathing is easier today   Objective:  Vital signs in last 24 hours:  Temp:  [97.5 F (36.4 C)-98.9 F (37.2 C)] 97.8 F (36.6 C) (02/18 1125) Pulse Rate:  [47-106] 94 (02/18 1125) Resp:  [13-21] 18 (02/18 1125) BP: (103-144)/(67-93) 109/69 (02/18 1125) SpO2:  [94 %-100 %] 100 % (02/18 1125) Weight:  [62.8 kg] 62.8 kg (02/17 1750)  Weight change:  Filed Weights   11/27/23 1630 11/30/23 1342 11/30/23 1750  Weight: 65.2 kg 62.8 kg 62.8 kg    Intake/Output: I/O last 3 completed shifts: In: 240 [P.O.:240] Out: 0    Intake/Output this shift:  No intake/output data recorded.  Physical Exam: General: NAD  Head: Normocephalic, atraumatic. Moist oral mucosal membranes  Eyes: Anicteric  Lungs:  Clear to auscultation, normal effort  Heart: Regular rate and rhythm  Abdomen:  Soft, nontender, mild distension  Extremities: No peripheral edema.  Neurologic: Alert and oriented, moving all four extremities  Skin: No lesions  Access: Right chest tunneled catheter    Basic Metabolic Panel: Recent Labs  Lab 11/26/23 1122 11/27/23 0421 11/28/23 0827 11/30/23 1407  NA 142 141 137 137  K 4.5 5.0 4.0 4.7  CL 100 102 101 96*  CO2 18* 17* 20* 16*  GLUCOSE 112* 88 66* 93  BUN 102* 116* 69* 94*  CREATININE 11.92* 12.16*  7.24* 9.72*  CALCIUM 9.4 8.7* 8.4* 8.6*  PHOS  --   --   --  5.8*    Liver Function Tests: Recent Labs  Lab 11/26/23 1122 11/27/23 0421 11/30/23 1407  AST 30 24  --   ALT 16 14  --   ALKPHOS 96 70  --   BILITOT 2.2* 1.8*  --   PROT 7.5 6.7  --   ALBUMIN 2.8* 2.4* 2.4*   Recent Labs  Lab 11/26/23 1122  LIPASE 35   Recent Labs  Lab 11/26/23 1921  AMMONIA 67*    CBC: Recent Labs  Lab 11/26/23 1122 11/26/23 2344 11/27/23 0421 11/27/23 1125 11/28/23 0543 11/30/23 1407  WBC 15.8* 15.8* 14.7* 14.0* 13.7* 11.5*  NEUTROABS 13.4*  --   --   --   --   --   HGB 11.0* 9.9* 9.7* 9.4* 9.0* 8.9*  HCT 33.1* 29.9* 28.6* 28.4* 25.4* 26.9*  MCV 92.5 92.3 90.8 90.2 87.0 91.2  PLT 308 246 241 248 205 190    Cardiac Enzymes: No results for input(s): "CKTOTAL", "CKMB", "CKMBINDEX", "TROPONINI" in the last 168 hours.  BNP: Invalid input(s): "POCBNP"  CBG: Recent Labs  Lab 11/28/23 0814 11/28/23 1153 11/29/23 0938 11/30/23 0738 12/01/23 0724  GLUCAP 68* 70 122* 87 72    Microbiology: Results for orders placed or performed during the hospital encounter of 11/26/23  Culture, blood (Routine x 2)     Status: None (Preliminary result)   Collection Time: 11/26/23  11:22 AM   Specimen: BLOOD  Result Value Ref Range Status   Specimen Description   Final    BLOOD RIGHT ANTECUBITAL Performed at [facility], [address] [place], [place] [address]    Special Requests   Final    BOTTLES DRAWN AEROBIC AND ANAEROBIC Blood Culture results may not be optimal due to an inadequate volume of blood received in culture bottles Performed at [facility], [address] [place], [place] [address]    Culture  Setup Time   Final    ANAEROBIC BOTTLE ONLY GRAM NEGATIVE RODS CRITICAL RESULT CALLED TO, READ BACK BY AND VERIFIED WITH: CAROLINE COULTER @ 11/28/23 2033 AB    Culture   Final    CULTURE REINCUBATED FOR BETTER GROWTH Performed at [facility], [address] [place], [place] [address]    Report Status PENDING  Incomplete  Blood Culture ID Panel (Reflexed)     Status: None   Collection Time: 11/26/23 11:22 AM  Result Value Ref Range Status   Enterococcus faecalis NOT DETECTED NOT DETECTED Final   Enterococcus Faecium NOT DETECTED NOT DETECTED Final   Listeria monocytogenes NOT DETECTED NOT DETECTED Final   Staphylococcus species NOT DETECTED NOT DETECTED Final   Staphylococcus aureus (BCID) NOT DETECTED NOT DETECTED Final   Staphylococcus epidermidis NOT DETECTED NOT DETECTED Final   Staphylococcus lugdunensis NOT DETECTED NOT DETECTED Final   Streptococcus species NOT DETECTED NOT DETECTED Final   Streptococcus agalactiae NOT DETECTED NOT DETECTED Final   Streptococcus pneumoniae NOT DETECTED NOT DETECTED Final   Streptococcus pyogenes NOT DETECTED NOT DETECTED Final   A.calcoaceticus-baumannii NOT DETECTED NOT DETECTED Final   Bacteroides fragilis NOT DETECTED NOT DETECTED Final   Enterobacterales NOT DETECTED NOT DETECTED Final   Enterobacter cloacae complex NOT DETECTED NOT DETECTED Final   Escherichia coli NOT DETECTED NOT DETECTED Final   Klebsiella aerogenes NOT DETECTED NOT DETECTED Final   Klebsiella oxytoca NOT DETECTED NOT DETECTED Final   Klebsiella pneumoniae NOT DETECTED NOT DETECTED Final   Proteus species NOT DETECTED NOT DETECTED Final   Salmonella species NOT DETECTED NOT DETECTED Final   Serratia marcescens NOT DETECTED NOT DETECTED Final   Haemophilus influenzae NOT DETECTED NOT DETECTED Final   Neisseria meningitidis NOT DETECTED NOT DETECTED Final   Pseudomonas aeruginosa NOT DETECTED NOT DETECTED Final   Stenotrophomonas maltophilia NOT DETECTED NOT DETECTED Final   Candida albicans NOT DETECTED NOT DETECTED Final   Candida auris NOT DETECTED NOT DETECTED Final   Candida glabrata NOT DETECTED NOT DETECTED Final   Candida krusei NOT DETECTED NOT DETECTED Final   Candida parapsilosis NOT DETECTED  NOT DETECTED Final   Candida tropicalis NOT DETECTED NOT DETECTED Final   Cryptococcus neoformans/gattii NOT DETECTED NOT DETECTED Final    Comment: Performed at [facility], [address] [place], [place] [address]  Culture, blood (Routine x 2)     Status: Abnormal   Collection Time: 11/26/23 12:23 PM   Specimen: BLOOD LEFT ARM  Result Value Ref Range Status   Specimen Description   Final    BLOOD LEFT ARM Performed at [facility], [address], [place], [place] [address]    Special Requests   Final    BOTTLES DRAWN AEROBIC AND ANAEROBIC Blood Culture results may not be optimal due to an inadequate volume of blood received in culture bottles Performed at [facility], [address] [place], [place] [address]  Culture  Setup Time   Final    GRAM POSITIVE COCCI AEROBIC BOTTLE ONLY CRITICAL RESULT CALLED TO, READ BACK BY AND VERIFIED WITH: BRIANA ALLEY ON 11/27/23 AT 1107 QSD    Culture (A)  Final    STAPHYLOCOCCUS EPIDERMIDIS THE SIGNIFICANCE OF ISOLATING THIS ORGANISM FROM A SINGLE SET OF BLOOD CULTURES WHEN MULTIPLE SETS ARE DRAWN IS UNCERTAIN. PLEASE NOTIFY THE MICROBIOLOGY DEPARTMENT WITHIN ONE WEEK IF SPECIATION AND SENSITIVITIES ARE REQUIRED. Performed at [facility], [address], [place], [place] [address]    Report Status 11/29/2023 FINAL  Final  Blood Culture ID Panel (Reflexed)     Status: Abnormal   Collection Time: 11/26/23 12:23 PM  Result Value Ref Range Status   Enterococcus faecalis NOT DETECTED NOT DETECTED Final   Enterococcus Faecium NOT DETECTED NOT DETECTED Final   Listeria monocytogenes NOT DETECTED NOT DETECTED Final   Staphylococcus species DETECTED (A) NOT DETECTED Final    Comment: CRITICAL RESULT CALLED TO, READ BACK BY AND VERIFIED WITH: BRIANA ALLEY ON 11/27/23 AT 1107 QSD    Staphylococcus aureus (BCID) NOT DETECTED NOT DETECTED Final   Staphylococcus epidermidis DETECTED (A) NOT DETECTED Final    Comment:  CRITICAL RESULT CALLED TO, READ BACK BY AND VERIFIED WITH: BRIANA ALLEY ON 11/27/23 AT 1107 QSD    Staphylococcus lugdunensis NOT DETECTED NOT DETECTED Final   Streptococcus species NOT DETECTED NOT DETECTED Final   Streptococcus agalactiae NOT DETECTED NOT DETECTED Final   Streptococcus pneumoniae NOT DETECTED NOT DETECTED Final   Streptococcus pyogenes NOT DETECTED NOT DETECTED Final   A.calcoaceticus-baumannii NOT DETECTED NOT DETECTED Final   Bacteroides fragilis NOT DETECTED NOT DETECTED Final   Enterobacterales NOT DETECTED NOT DETECTED Final   Enterobacter cloacae complex NOT DETECTED NOT DETECTED Final   Escherichia coli NOT DETECTED NOT DETECTED Final   Klebsiella aerogenes NOT DETECTED NOT DETECTED Final   Klebsiella oxytoca NOT DETECTED NOT DETECTED Final   Klebsiella pneumoniae NOT DETECTED NOT DETECTED Final   Proteus species NOT DETECTED NOT DETECTED Final   Salmonella species NOT DETECTED NOT DETECTED Final   Serratia marcescens NOT DETECTED NOT DETECTED Final   Haemophilus influenzae NOT DETECTED NOT DETECTED Final   Neisseria meningitidis NOT DETECTED NOT DETECTED Final   Pseudomonas aeruginosa NOT DETECTED NOT DETECTED Final   Stenotrophomonas maltophilia NOT DETECTED NOT DETECTED Final   Candida albicans NOT DETECTED NOT DETECTED Final   Candida auris NOT DETECTED NOT DETECTED Final   Candida glabrata NOT DETECTED NOT DETECTED Final   Candida krusei NOT DETECTED NOT DETECTED Final   Candida parapsilosis NOT DETECTED NOT DETECTED Final   Candida tropicalis NOT DETECTED NOT DETECTED Final   Cryptococcus neoformans/gattii NOT DETECTED NOT DETECTED Final   Methicillin resistance mecA/C NOT DETECTED NOT DETECTED Final    Comment: Performed at [facility], [address] [place], [place] [address]  Body fluid culture w Gram Stain     Status: None   Collection Time: 11/26/23  8:55 PM   Specimen: PATH Cytology Peritoneal fluid  Result Value Ref Range Status    Specimen Description   Final    PERITONEAL CYTO Performed at [facility], [address] [place], [place] [address]    Special Requests   Final    NONE Performed at [facility], [address] [place], [place] [address]    Gram Stain   Final    FEW WBC PRESENT,BOTH PMN AND MONONUCLEAR FEW GRAM POSITIVE COCCI IN CHAINS  MODERATE GRAM NEGATIVE RODS FEW GRAM POSITIVE RODS Performed at [facility], [address] [place], [place] [address]    Culture   Final    MODERATE STREPTOCOCCUS MITIS/ORALIS RARE CITROBACTER AMALONATICUS    Report Status 11/30/2023 FINAL  Final   Organism ID, Bacteria STREPTOCOCCUS MITIS/ORALIS  Final   Organism ID, Bacteria CITROBACTER AMALONATICUS  Final      Susceptibility   Citrobacter amalonaticus - MIC*    CEFEPIME <=0.12 SENSITIVE Sensitive     CEFTAZIDIME <=1 SENSITIVE Sensitive     CEFTRIAXONE <=0.25 SENSITIVE Sensitive     CIPROFLOXACIN <=0.25 SENSITIVE Sensitive     GENTAMICIN <=1 SENSITIVE Sensitive     IMIPENEM <=0.25 SENSITIVE Sensitive     TRIMETH/SULFA <=20 SENSITIVE Sensitive     PIP/TAZO <=4 SENSITIVE Sensitive ug/mL    * RARE CITROBACTER AMALONATICUS   Streptococcus mitis/oralis - MIC*    PENICILLIN 0.25 INTERMEDIATE Intermediate     CEFTRIAXONE 0.25 SENSITIVE Sensitive     LEVOFLOXACIN 1 SENSITIVE Sensitive     VANCOMYCIN 0.5 SENSITIVE Sensitive     * MODERATE STREPTOCOCCUS MITIS/ORALIS  Culture, blood (Routine X 2) w Reflex to ID Panel     Status: None (Preliminary result)   Collection Time: 11/28/23  5:43 AM   Specimen: BLOOD LEFT ARM  Result Value Ref Range Status   Specimen Description BLOOD LEFT ARM  Final   Special Requests   Final    BOTTLES DRAWN AEROBIC AND ANAEROBIC Blood Culture results may not be optimal due to an inadequate volume of blood received in culture bottles   Culture   Final    NO GROWTH 3 DAYS Performed at [facility], [address], [place], [place] [address]     Report Status PENDING  Incomplete  Culture, blood (Routine X 2) w Reflex to ID Panel     Status: None (Preliminary result)   Collection Time: 11/28/23  5:47 AM   Specimen: BLOOD RIGHT HAND  Result Value Ref Range Status   Specimen Description BLOOD RIGHT HAND  Final   Special Requests   Final    BOTTLES DRAWN AEROBIC AND ANAEROBIC Blood Culture results may not be optimal due to an inadequate volume of blood received in culture bottles   Culture   Final    NO GROWTH 3 DAYS Performed at [facility] [address] [place], [place] [address]    Report Status PENDING  Incomplete    Coagulation Studies: No results for input(s): "LABPROT", "INR" in the last 72 hours.   Urinalysis: No results for input(s): "COLORURINE", "LABSPEC", "PHURINE", "GLUCOSEU", "HGBUR", "BILIRUBINUR", "KETONESUR", "PROTEINUR", "UROBILINOGEN", "NITRITE", "LEUKOCYTESUR" in the last 72 hours.  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 Found to have spontaneous bacterial peritonitis. Request for diagnostic and therapeutic paracentesis. EXAM: ULTRASOUND GUIDED left PARACENTESIS MEDICATIONS: 1% lidocaine, 10mL. COMPLICATIONS: None immediate. PROCEDURE: Informed written consent was obtained from the patient after a discussion of the risks, benefits and alternatives to treatment. A timeout was performed prior to the initiation of the procedure. Initial ultrasound scanning demonstrates a large amount of ascites within the left lower abdominal quadrant. The left lower abdomen was prepped and draped in the usual sterile fashion. 1% lidocaine was used for local anesthesia. Following this, a 6 Fr Safe-T-Centesis catheter was introduced. An  ultrasound image was saved for documentation purposes. The paracentesis was  performed. The catheter was removed and a dressing was applied. The patient tolerated the procedure well without immediate post procedural complication. FINDINGS: A total of approximately 4.6L of opaque, yellow fluid was removed. Samples were sent to the laboratory as requested by the clinical team. IMPRESSION: Successful ultrasound-guided paracentesis yielding 4.6 liters of peritoneal fluid. Procedure Performed by: Sherlene Shams, PA-C Electronically Signed   By: Malachy Moan M.D.   On: 12/01/2023 11:27      Medications:    ceFEPime (MAXIPIME) IV 1 g (11/30/23 2233)    amLODipine  10 mg Oral Daily   Chlorhexidine Gluconate Cloth  6 each Topical Q0600   feeding supplement (NEPRO CARB STEADY)  237 mL Oral BID BM   irbesartan  300 mg Oral Daily   lactulose  20 g Oral BID   metoprolol tartrate  25 mg Oral BID   pantoprazole  40 mg Oral Daily   tamsulosin  0.4 mg Oral Daily   acetaminophen, alum & mag hydroxide-simeth, hydrALAZINE, ondansetron (ZOFRAN) IV  Assessment/ Plan:  Casey Franco is a 63 y.o.  male with HTN, ESRD, DM-2, Hep C.  Patient presents with chest pain and has been admitted for Intractable vomiting [R11.10] Abdominal pain [R10.9] Pneumonia of both lungs due to infectious organism, unspecified part of lung [J18.9] Sepsis without acute organ dysfunction, due to unspecified organism [facility]) [A41.9]  [facility]/MWF/right chest PermCath/71.0 kg  End-stage renal disease on hemodialysis.  Received dialysis yesterday, due to lack of edema, no fluid was pulled. Next treatment scheduled for Wednesday.   2. Anemia of chronic kidney disease Lab Results  Component Value Date   HGB 8.9 (L) 11/30/2023    Patient does receive Mircera at outpatient clinic.  Will order low dose EPO with next dialysis  3. Secondary Hyperparathyroidism: with outpatient labs: PTH 351, phosphorus 6.6, calcium 8.1 on 11/23/2023.   Lab Results  Component Value Date   PTH 31  08/30/2018   CALCIUM 8.6 (L) 11/30/2023   PHOS 5.8 (H) 11/30/2023    Patient prescribed calcium acetate outpatient.   4.  Hypertension with chronic kidney disease.  Home regimen includes furosemide, hydralazine, amlodipine, and irbesartan. Blood pressure 109/69, acceptable for this patient.  5.  Cirrhosis/ascites, spontaneous bacterial peritonitis. Paracentesis from 11/27/2023 yielded 1.6 L of fluid.  Complex loculations suspected as more ascites present. Microbiology results show Staph epidermidis in the blood. Peritoneal fluid culture from 11/26/2023 shows Streptococcus and Citrobacter. Currently getting broad-spectrum antibiotics cefepime. Also getting lactulose twice a day. Paracentesis this morning with 4..6L removed.    LOS: 5 Reggie Bise 2/18/202511:58 AM

## 2023-12-02 DIAGNOSIS — K652 Spontaneous bacterial peritonitis: Secondary | ICD-10-CM | POA: Diagnosis not present

## 2023-12-02 DIAGNOSIS — A419 Sepsis, unspecified organism: Secondary | ICD-10-CM | POA: Diagnosis not present

## 2023-12-02 DIAGNOSIS — N186 End stage renal disease: Secondary | ICD-10-CM | POA: Diagnosis not present

## 2023-12-02 DIAGNOSIS — K7460 Unspecified cirrhosis of liver: Secondary | ICD-10-CM | POA: Diagnosis not present

## 2023-12-02 LAB — RENAL FUNCTION PANEL
Albumin: 1.9 g/dL — ABNORMAL LOW (ref 3.5–5.0)
Anion gap: 17 — ABNORMAL HIGH (ref 5–15)
BUN: 72 mg/dL — ABNORMAL HIGH (ref 8–23)
CO2: 18 mmol/L — ABNORMAL LOW (ref 22–32)
Calcium: 8 mg/dL — ABNORMAL LOW (ref 8.9–10.3)
Chloride: 97 mmol/L — ABNORMAL LOW (ref 98–111)
Creatinine, Ser: 7.29 mg/dL — ABNORMAL HIGH (ref 0.61–1.24)
GFR, Estimated: 8 mL/min — ABNORMAL LOW (ref >60)
Glucose, Bld: 106 mg/dL — ABNORMAL HIGH (ref 70–99)
Phosphorus: 5.1 mg/dL — ABNORMAL HIGH (ref 2.5–4.6)
Potassium: 3.7 mmol/L (ref 3.5–5.1)
Sodium: 132 mmol/L — ABNORMAL LOW (ref 135–145)

## 2023-12-02 LAB — CBC
HCT: 24.1 % — ABNORMAL LOW (ref 39.0–52.0)
Hemoglobin: 8.2 g/dL — ABNORMAL LOW (ref 13.0–17.0)
MCH: 30.7 pg (ref 26.0–34.0)
MCHC: 34 g/dL (ref 30.0–36.0)
MCV: 90.3 fL (ref 80.0–100.0)
Platelets: 140 10*3/uL — ABNORMAL LOW (ref 150–400)
RBC: 2.67 MIL/uL — ABNORMAL LOW (ref 4.22–5.81)
RDW: 15.8 % — ABNORMAL HIGH (ref 11.5–15.5)
WBC: 8.9 10*3/uL (ref 4.0–10.5)
nRBC: 0 % (ref 0.0–0.2)

## 2023-12-02 LAB — MISC LABCORP TEST (SEND OUT): Labcorp test code: 9985

## 2023-12-02 MED ORDER — HEPARIN SODIUM (PORCINE) 1000 UNIT/ML DIALYSIS: 1000.0000 [IU] | INTRAMUSCULAR | Status: DC | PRN

## 2023-12-02 MED ORDER — EPOETIN ALFA-EPBX 4000 UNIT/ML IJ SOLN
4000.0000 [IU] | INTRAMUSCULAR | Status: DC
  Administered 2023-12-02 – 2023-12-04 (×2): 4000 [IU] via INTRAVENOUS
  Filled 2023-12-02 (×2): qty 1

## 2023-12-02 MED ORDER — HEPARIN SODIUM (PORCINE) 1000 UNIT/ML IJ SOLN
INTRAMUSCULAR | Status: AC
  Filled 2023-12-02: qty 10

## 2023-12-02 MED ORDER — EPOETIN ALFA-EPBX 4000 UNIT/ML IJ SOLN
INTRAMUSCULAR | Status: AC
  Filled 2023-12-02: qty 1

## 2023-12-02 MED ORDER — ALTEPLASE 2 MG IJ SOLR: 2.0000 mg | Freq: Once | INTRAMUSCULAR | Status: DC | PRN

## 2023-12-02 MED ORDER — ACETAMINOPHEN 325 MG PO TABS
ORAL_TABLET | ORAL | Status: AC
  Filled 2023-12-02: qty 2

## 2023-12-02 NOTE — Plan of Care (Signed)

## 2023-12-02 NOTE — Progress Notes (Signed)
   12/02/23 1500  Spiritual Encounters  Type of Visit Follow up  Care provided to: Patient  Reason for visit Advance directives  OnCall Visit No   Chaplain left Advanced Directive bedside per patient preference, staff aware. Patient currently cared for by interdisciplinary team.  Aware to notify when ready discuss and complete documentation.

## 2023-12-02 NOTE — Progress Notes (Signed)
[facility]  ROUNDING NOTE   Subjective:   Casey Franco is a 63 y.o. male with past medical history of HTN, ESRD, DM-2, and Hep C. Patient presents to emergency department with abdominal pain. He has been admitted for Intractable vomiting [R11.10] Abdominal pain [R10.9] Pneumonia of both lungs due to infectious organism, unspecified part of lung [J18.9] Sepsis without acute organ dysfunction, due to unspecified organism [facility]) [A41.9]  Patient is known to our practice and receives outpatient dialysis at [facility] on a MWF, last treatment on Tuesday.    Patient seen and evaluated during dialysis   HEMODIALYSIS FLOWSHEET:  Blood Flow Rate (mL/min): 300 mL/min Arterial Pressure (mmHg): -206.25 mmHg Venous Pressure (mmHg): 107.87 mmHg TMP (mmHg): 10.5 mmHg Ultrafiltration Rate (mL/min): 392 mL/min Dialysate Flow Rate (mL/min): 300 ml/min  Complains of abd pain   Objective:  Vital signs in last 24 hours:  Temp:  [97.5 F (36.4 C)-98.3 F (36.8 C)] 97.5 F (36.4 C) (02/19 0828) Pulse Rate:  [75-86] 84 (02/19 1100) Resp:  [13-20] 19 (02/19 1100) BP: (95-124)/(67-76) 124/76 (02/19 1100) SpO2:  [93 %-100 %] 100 % (02/19 1100) Weight:  [58.5 kg] 58.5 kg (02/19 0856)  Weight change:  Filed Weights   11/30/23 1342 11/30/23 1750 12/02/23 0856  Weight: 62.8 kg 62.8 kg 58.5 kg    Intake/Output: I/O last 3 completed shifts: In: 740 [P.O.:240; IV Piggyback:500] Out: -    Intake/Output this shift:  No intake/output data recorded.  Physical Exam: General: NAD  Head: Normocephalic, atraumatic. Moist oral mucosal membranes  Eyes: Anicteric  Lungs:  Clear to auscultation, normal effort  Heart: Regular rate and rhythm  Abdomen:  Soft, tender, mild distension  Extremities: No peripheral edema.  Neurologic: Alert and oriented, moving all four extremities  Skin: No lesions  Access: Right chest tunneled catheter    Basic Metabolic Panel: Recent Labs   Lab 11/26/23 1122 11/27/23 0421 11/28/23 0827 11/30/23 1407 12/02/23 0905  NA 142 141 137 137 132*  K 4.5 5.0 4.0 4.7 3.7  CL 100 102 101 96* 97*  CO2 18* 17* 20* 16* 18*  GLUCOSE 112* 88 66* 93 106*  BUN 102* 116* 69* 94* 72*  CREATININE 11.92* 12.16* 7.24* 9.72* 7.29*  CALCIUM 9.4 8.7* 8.4* 8.6* 8.0*  PHOS  --   --   --  5.8* 5.1*    Liver Function Tests: Recent Labs  Lab 11/26/23 1122 11/27/23 0421 11/30/23 1407 12/02/23 0905  AST 30 24  --   --   ALT 16 14  --   --   ALKPHOS 96 70  --   --   BILITOT 2.2* 1.8*  --   --   PROT 7.5 6.7  --   --   ALBUMIN 2.8* 2.4* 2.4* 1.9*   Recent Labs  Lab 11/26/23 1122  LIPASE 35   Recent Labs  Lab 11/26/23 1921  AMMONIA 67*    CBC: Recent Labs  Lab 11/26/23 1122 11/26/23 2344 11/27/23 0421 11/27/23 1125 11/28/23 0543 11/30/23 1407 12/02/23 0905  WBC 15.8*   < > 14.7* 14.0* 13.7* 11.5* 8.9  NEUTROABS 13.4*  --   --   --   --   --   --   HGB 11.0*   < > 9.7* 9.4* 9.0* 8.9* 8.2*  HCT 33.1*   < > 28.6* 28.4* 25.4* 26.9* 24.1*  MCV 92.5   < > 90.8 90.2 87.0 91.2 90.3  PLT 308   < >  241 248 205 190 140*   < > = values in this interval not displayed.    Cardiac Enzymes: No results for input(s): "CKTOTAL", "CKMB", "CKMBINDEX", "TROPONINI" in the last 168 hours.  BNP: Invalid input(s): "POCBNP"  CBG: Recent Labs  Lab 11/28/23 1153 11/29/23 0938 11/30/23 0738 12/01/23 0724 12/01/23 1602  GLUCAP 70 122* 87 72 80    Microbiology: Results for orders placed or performed during the hospital encounter of 11/26/23  Culture, blood (Routine x 2)     Status: Abnormal   Collection Time: 11/26/23 11:22 AM   Specimen: BLOOD  Result Value Ref Range Status   Specimen Description   Final    BLOOD RIGHT ANTECUBITAL Performed at [facility], [address] [place], [place] [address]    Special Requests   Final    BOTTLES DRAWN AEROBIC AND ANAEROBIC Blood Culture results may not be optimal due to an  inadequate volume of blood received in culture bottles Performed at [facility], [address] [place], [place] [address]    Culture  Setup Time   Final    ANAEROBIC BOTTLE ONLY GRAM NEGATIVE RODS CRITICAL RESULT CALLED TO, READ BACK BY AND VERIFIED WITH: CAROLINE COULTER @ 11/28/23 2033 AB    Culture (A)  Final    BACTEROIDES THETAIOTAOMICRON BETA LACTAMASE POSITIVE Performed at [facility], [address] [place], [place] [address]    Report Status 12/01/2023 FINAL  Final  Blood Culture ID Panel (Reflexed)     Status: None   Collection Time: 11/26/23 11:22 AM  Result Value Ref Range Status   Enterococcus faecalis NOT DETECTED NOT DETECTED Final   Enterococcus Faecium NOT DETECTED NOT DETECTED Final   Listeria monocytogenes NOT DETECTED NOT DETECTED Final   Staphylococcus species NOT DETECTED NOT DETECTED Final   Staphylococcus aureus (BCID) NOT DETECTED NOT DETECTED Final   Staphylococcus epidermidis NOT DETECTED NOT DETECTED Final   Staphylococcus lugdunensis NOT DETECTED NOT DETECTED Final   Streptococcus species NOT DETECTED NOT DETECTED Final   Streptococcus agalactiae NOT DETECTED NOT DETECTED Final   Streptococcus pneumoniae NOT DETECTED NOT DETECTED Final   Streptococcus pyogenes NOT DETECTED NOT DETECTED Final   A.calcoaceticus-baumannii NOT DETECTED NOT DETECTED Final   Bacteroides fragilis NOT DETECTED NOT DETECTED Final   Enterobacterales NOT DETECTED NOT DETECTED Final   Enterobacter cloacae complex NOT DETECTED NOT DETECTED Final   Escherichia coli NOT DETECTED NOT DETECTED Final   Klebsiella aerogenes NOT DETECTED NOT DETECTED Final   Klebsiella oxytoca NOT DETECTED NOT DETECTED Final   Klebsiella pneumoniae NOT DETECTED NOT DETECTED Final   Proteus species NOT DETECTED NOT DETECTED Final   Salmonella species NOT DETECTED NOT DETECTED Final   Serratia marcescens NOT DETECTED NOT DETECTED Final   Haemophilus influenzae NOT DETECTED NOT  DETECTED Final   Neisseria meningitidis NOT DETECTED NOT DETECTED Final   Pseudomonas aeruginosa NOT DETECTED NOT DETECTED Final   Stenotrophomonas maltophilia NOT DETECTED NOT DETECTED Final   Candida albicans NOT DETECTED NOT DETECTED Final   Candida auris NOT DETECTED NOT DETECTED Final   Candida glabrata NOT DETECTED NOT DETECTED Final   Candida krusei NOT DETECTED NOT DETECTED Final   Candida parapsilosis NOT DETECTED NOT DETECTED Final   Candida tropicalis NOT DETECTED NOT DETECTED Final   Cryptococcus neoformans/gattii NOT DETECTED NOT DETECTED Final    Comment: Performed at [facility], [address] [place], [place] [address]  Culture, blood (Routine x 2)  Status: Abnormal   Collection Time: 11/26/23 12:23 PM   Specimen: BLOOD LEFT ARM  Result Value Ref Range Status   Specimen Description   Final    BLOOD LEFT ARM Performed at [facility], [address] [place], [place] [address]    Special Requests   Final    BOTTLES DRAWN AEROBIC AND ANAEROBIC Blood Culture results may not be optimal due to an inadequate volume of blood received in culture bottles Performed at [facility], [address] [place], [place] [address]    Culture  Setup Time   Final    GRAM POSITIVE COCCI AEROBIC BOTTLE ONLY CRITICAL RESULT CALLED TO, READ BACK BY AND VERIFIED WITH: BRIANA ALLEY ON 11/27/23 AT 1107 QSD    Culture (A)  Final    STAPHYLOCOCCUS EPIDERMIDIS THE SIGNIFICANCE OF ISOLATING THIS ORGANISM FROM A SINGLE SET OF BLOOD CULTURES WHEN MULTIPLE SETS ARE DRAWN IS UNCERTAIN. PLEASE NOTIFY THE MICROBIOLOGY DEPARTMENT WITHIN ONE WEEK IF SPECIATION AND SENSITIVITIES ARE REQUIRED. Performed at [facility], [address] [place], [place] [address]    Report Status 11/29/2023 FINAL  Final  Blood Culture ID Panel (Reflexed)     Status: Abnormal   Collection Time: 11/26/23 12:23 PM  Result Value Ref Range Status   Enterococcus faecalis NOT DETECTED NOT  DETECTED Final   Enterococcus Faecium NOT DETECTED NOT DETECTED Final   Listeria monocytogenes NOT DETECTED NOT DETECTED Final   Staphylococcus species DETECTED (A) NOT DETECTED Final    Comment: CRITICAL RESULT CALLED TO, READ BACK BY AND VERIFIED WITH: BRIANA ALLEY ON 11/27/23 AT 1107 QSD    Staphylococcus aureus (BCID) NOT DETECTED NOT DETECTED Final   Staphylococcus epidermidis DETECTED (A) NOT DETECTED Final    Comment: CRITICAL RESULT CALLED TO, READ BACK BY AND VERIFIED WITH: BRIANA ALLEY ON 11/27/23 AT 1107 QSD    Staphylococcus lugdunensis NOT DETECTED NOT DETECTED Final   Streptococcus species NOT DETECTED NOT DETECTED Final   Streptococcus agalactiae NOT DETECTED NOT DETECTED Final   Streptococcus pneumoniae NOT DETECTED NOT DETECTED Final   Streptococcus pyogenes NOT DETECTED NOT DETECTED Final   A.calcoaceticus-baumannii NOT DETECTED NOT DETECTED Final   Bacteroides fragilis NOT DETECTED NOT DETECTED Final   Enterobacterales NOT DETECTED NOT DETECTED Final   Enterobacter cloacae complex NOT DETECTED NOT DETECTED Final   Escherichia coli NOT DETECTED NOT DETECTED Final   Klebsiella aerogenes NOT DETECTED NOT DETECTED Final   Klebsiella oxytoca NOT DETECTED NOT DETECTED Final   Klebsiella pneumoniae NOT DETECTED NOT DETECTED Final   Proteus species NOT DETECTED NOT DETECTED Final   Salmonella species NOT DETECTED NOT DETECTED Final   Serratia marcescens NOT DETECTED NOT DETECTED Final   Haemophilus influenzae NOT DETECTED NOT DETECTED Final   Neisseria meningitidis NOT DETECTED NOT DETECTED Final   Pseudomonas aeruginosa NOT DETECTED NOT DETECTED Final   Stenotrophomonas maltophilia NOT DETECTED NOT DETECTED Final   Candida albicans NOT DETECTED NOT DETECTED Final   Candida auris NOT DETECTED NOT DETECTED Final   Candida glabrata NOT DETECTED NOT DETECTED Final   Candida krusei NOT DETECTED NOT DETECTED Final   Candida parapsilosis NOT DETECTED NOT DETECTED Final    Candida tropicalis NOT DETECTED NOT DETECTED Final   Cryptococcus neoformans/gattii NOT DETECTED NOT DETECTED Final   Methicillin resistance mecA/C NOT DETECTED NOT DETECTED Final    Comment: Performed at [facility], [address] [place], [place] [address]  Body fluid culture w Gram Stain  Status: None   Collection Time: 11/26/23  8:55 PM   Specimen: PATH Cytology Peritoneal fluid  Result Value Ref Range Status   Specimen Description   Final    PERITONEAL CYTO Performed at [facility], [address], [place], [place] [address]    Special Requests   Final    NONE Performed at [facility], [address] [place], [place] [address]    Gram Stain   Final    FEW WBC PRESENT,BOTH PMN AND MONONUCLEAR FEW GRAM POSITIVE COCCI IN CHAINS MODERATE GRAM NEGATIVE RODS FEW GRAM POSITIVE RODS Performed at [facility], [address] [place], [place] [address]    Culture   Final    MODERATE STREPTOCOCCUS MITIS/ORALIS RARE CITROBACTER AMALONATICUS    Report Status 11/30/2023 FINAL  Final   Organism ID, Bacteria STREPTOCOCCUS MITIS/ORALIS  Final   Organism ID, Bacteria CITROBACTER AMALONATICUS  Final      Susceptibility   Citrobacter amalonaticus - MIC*    CEFEPIME <=0.12 SENSITIVE Sensitive     CEFTAZIDIME <=1 SENSITIVE Sensitive     CEFTRIAXONE <=0.25 SENSITIVE Sensitive     CIPROFLOXACIN <=0.25 SENSITIVE Sensitive     GENTAMICIN <=1 SENSITIVE Sensitive     IMIPENEM <=0.25 SENSITIVE Sensitive     TRIMETH/SULFA <=20 SENSITIVE Sensitive     PIP/TAZO <=4 SENSITIVE Sensitive ug/mL    * RARE CITROBACTER AMALONATICUS   Streptococcus mitis/oralis - MIC*    PENICILLIN 0.25 INTERMEDIATE Intermediate     CEFTRIAXONE 0.25 SENSITIVE Sensitive     LEVOFLOXACIN 1 SENSITIVE Sensitive     VANCOMYCIN 0.5 SENSITIVE Sensitive     * MODERATE STREPTOCOCCUS MITIS/ORALIS  Culture, blood (Routine X 2) w Reflex to ID Panel     Status: None (Preliminary result)    Collection Time: 11/28/23  5:43 AM   Specimen: BLOOD LEFT ARM  Result Value Ref Range Status   Specimen Description BLOOD LEFT ARM  Final   Special Requests   Final    BOTTLES DRAWN AEROBIC AND ANAEROBIC Blood Culture results may not be optimal due to an inadequate volume of blood received in culture bottles   Culture   Final    NO GROWTH 4 DAYS Performed at [facility], [address] [place], [place] [address]    Report Status PENDING  Incomplete  Culture, blood (Routine X 2) w Reflex to ID Panel     Status: None (Preliminary result)   Collection Time: 11/28/23  5:47 AM   Specimen: BLOOD RIGHT HAND  Result Value Ref Range Status   Specimen Description BLOOD RIGHT HAND  Final   Special Requests   Final    BOTTLES DRAWN AEROBIC AND ANAEROBIC Blood Culture results may not be optimal due to an inadequate volume of blood received in culture bottles   Culture   Final    NO GROWTH 4 DAYS Performed at [facility], [address] [place], [place] [address]    Report Status PENDING  Incomplete  Body fluid culture w Gram Stain     Status: None (Preliminary result)   Collection Time: 12/01/23 10:10 AM   Specimen: Ascitic; Body Fluid  Result Value Ref Range Status   Specimen Description   Final    ASCITIC Performed at [facility], [address], [place], [place] [address]    Special Requests   Final    peri Performed at [facility], [address] [place], [place] [address]    Gram  Stain   Final    FEW WBC PRESENT, PREDOMINANTLY PMN ABUNDANT GRAM NEGATIVE RODS ABUNDANT GRAM POSITIVE COCCI RARE GRAM POSITIVE RODS    Culture   Final    NO GROWTH < 12 HOURS Performed at [facility], [address], [place], [place] [address]    Report Status PENDING  Incomplete    Coagulation Studies: No results for input(s): "LABPROT", "INR" in the last 72 hours.   Urinalysis: No results for input(s): "COLORURINE", "LABSPEC", "PHURINE",  "GLUCOSEU", "HGBUR", "BILIRUBINUR", "KETONESUR", "PROTEINUR", "UROBILINOGEN", "NITRITE", "LEUKOCYTESUR" in the last 72 hours.  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 Found to have spontaneous bacterial peritonitis. Request for diagnostic and therapeutic paracentesis. EXAM: ULTRASOUND GUIDED left PARACENTESIS MEDICATIONS: 1% lidocaine, 10mL. COMPLICATIONS: None immediate. PROCEDURE: Informed written consent was obtained from the patient after a discussion of the risks, benefits and alternatives to treatment. A timeout was performed prior to the initiation of the procedure. Initial ultrasound scanning demonstrates a large amount of ascites within the left lower abdominal quadrant. The left lower abdomen was prepped and draped in the usual sterile fashion. 1% lidocaine was used for local anesthesia. Following this, a 6 Fr Safe-T-Centesis catheter was introduced. An ultrasound image was saved for documentation purposes. The paracentesis was performed. The catheter was removed and a dressing was applied. The patient tolerated the procedure well without immediate post procedural complication. FINDINGS: A total of approximately 4.6L of opaque, yellow fluid was removed. Samples were sent to the laboratory as requested by the clinical team. IMPRESSION: Successful ultrasound-guided paracentesis yielding 4.6 liters of peritoneal fluid. Procedure Performed by: Sherlene Shams, PA-C Electronically Signed   By: Malachy Moan M.D.   On: 12/01/2023 11:27      Medications:    ceFEPime (MAXIPIME) IV Stopped (12/01/23 2356)   metronidazole 500 mg (12/02/23 0842)    amLODipine  10 mg Oral Daily   Chlorhexidine Gluconate Cloth  6 each Topical Q0600   feeding supplement (NEPRO CARB STEADY)  237 mL Oral BID BM    irbesartan  300 mg Oral Daily   lactulose  20 g Oral BID   metoprolol tartrate  25 mg Oral BID   pantoprazole  40 mg Oral Daily   tamsulosin  0.4 mg Oral Daily   acetaminophen, alteplase, alum & mag hydroxide-simeth, heparin, hydrALAZINE, ondansetron (ZOFRAN) IV  Assessment/ Plan:  Mr. RAE SUTCLIFFE is a 63 y.o.  male with HTN, ESRD, DM-2, Hep C.  Patient presents with chest pain and has been admitted for Intractable vomiting [R11.10] Abdominal pain [R10.9] Pneumonia of both lungs due to infectious organism, unspecified part of lung [J18.9] Sepsis without acute organ dysfunction, due to unspecified organism [facility]) [A41.9]  [facility]/MWF/right chest PermCath/71.0 kg  End-stage renal disease on hemodialysis.  Receiving treatment today, UF 0. Next treatment scheduled for Friday.   2. Anemia of chronic kidney disease Lab Results  Component Value Date   HGB 8.2 (L) 12/02/2023    Patient does receive Mircera at outpatient clinic.  Will order low dose EPO with this dialysis  3. Secondary Hyperparathyroidism: with outpatient labs: PTH 351, phosphorus 6.6, calcium 8.1 on 11/23/2023.   Lab Results  Component Value Date   PTH 31 08/30/2018   CALCIUM 8.0 (L) 12/02/2023   PHOS 5.1 (H) 12/02/2023  Will continue to monitor bone minerals    4.  Hypertension with chronic kidney disease.  Home regimen includes furosemide, hydralazine, amlodipine, and irbesartan. Blood pressure stable  5.  Cirrhosis/ascites, spontaneous bacterial peritonitis. Paracentesis from 11/27/2023 yielded 1.6 L of fluid.  Complex loculations suspected as more ascites present. Microbiology results show Staph epidermidis in the blood. Peritoneal fluid culture from 11/26/2023 shows Streptococcus and Citrobacter. Currently getting broad-spectrum antibiotics cefepime.Blood cultures negative. Also getting lactulose twice a day. Paracentesis on 12/02/23 with 4..6L removed.    LOS: 6 Jaysean Manville 2/19/202512:05 PM

## 2023-12-02 NOTE — Progress Notes (Signed)
Hemodialysis Note:  Received patient in bed to unit. Alert and oriented. Informed consent singed and in chart.  Treatment initiated: 0919 Treatment completed: 1300  Access used: Right internal jugular catheter Access issues: None  Patient tolerated well. Transported back to room, alert without acute distress. Report given to patient's RN.  Total UF removed: 0 Medications given: Tylenol 650 mg Tablets Retacrit 4000 units IV  Post HD weight: 58.5 Kg  [facility]

## 2023-12-02 NOTE — Progress Notes (Signed)
Progress Note    Casey Franco  ZOX:[id number] DOB: 12/14/1960  DOA: 11/26/2023 PCP: [facility]      Brief Narrative:    Medical records reviewed and are as summarized below:  Casey Franco is a 63 y.o. 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  He also complained of nausea, increasing abdominal distention and multiple episodes of vomiting.      Assessment/Plan:   Principal Problem:   Spontaneous bacterial peritonitis (HCC) Active Problems:   Abdominal pain   Cirrhosis of liver with ascites (HCC)   Myocardial injury   Essential hypertension   ESRD (end stage renal disease) (HCC)   Coffee ground emesis   Paroxysmal atrial fibrillation (HCC)   Sepsis without acute organ dysfunction (HCC)    Body mass index is 17.49 kg/m.   Spontaneous bacterial peritonitis Abdominal pain Liver cirrhosis with ascites: S/p paracentesis with removal of 1600 mL of fluid on 11/26/2023.   Fluid positive for SBP (ascitic fluid total nucleated cells 1,326, percentage neutrophils was 68% and absolute PMN count 901) Ascitic fluid culture showed Streptococcus mitis and Citrobacter amalonaticus S/p repeat paracentesis on 12/01/2023 with removal of 4.6 L of fluid. Percentage neutrophil count was 100 and total nucleated cell count 4,687. He was evaluated by the general surgeon.  No indication for surgery at this time.   --Continue IV Cefepime, Flagyl --Follow cultures  Staph epidermidis bacteremia: 1 out of 4 bottles from 11/26/2023 positive for Staph epidermidis.  This is likely a contaminant.  Repeat blood cultures from 11/27/2023 has not shown any growth thus far.  Bacteroides  thetaiotaomicron bacteremia (blood cultures from 11/26/2023 growing gram-negative rods).   --Continue IV  cefepime --IV Flagyl added, continue  --Dr. Myriam Forehand, prior attending had discussed with [name], ID pharmacist   Hematemesis: Resolved.  This is probably from multiple episodes of vomiting.  Improved.  H&H is stable.   Elevated troponins: Troponin is 172, 138, 127 and 128.  This is likely from demand ischemia.   Paroxysmal atrial fibrillation, PVCs: Heart rate is better.  Continue metoprolol.  We discussed risks, benefits and alternatives to long-term anticoagulation for atrial fibrillation. He is at increased risk for bleeding from long-term anticoagulation because of underlying severe liver and kidney disease. CHA2DS2-VASc score is 3 and he is also at increased risk for stroke. He wants to consider Eliquis for stroke prophylaxis. --Resume Eliquis when closer to discharge and clear no additional procedures needed   Chronic systolic CHF Moderate mitral regurgitation 2D echo showed EF estimated at 40 to 45%, moderate LVH, indeterminate left ventricular diastolic parameters, mildly reduced RV systolic function, moderately enlarged RV size, moderately dilated right atrium, small pericardial effusion, moderate mitral regurgitation --Volume management by dialysis  Confusion on 11/30/2023, likely acute toxic encephalopathy from oxycodone: Mental status is better today.  Oxycodone has been discontinued for now.   ESRD on hemodialysis: Follow-up with nephrologist for hemodialysis.   Dysphagia: Speech therapist recommended dysphagia 2 diet, thin liquids.   General weakness: PT recommended discharge to SNF.   Follow-up with TOC to assist with placement.   Comorbidities include hypertension, hyperlipidemia, BPH, type II DM   Prognosis is guarded   Follow-up with palliative care team.    Diet Order             DIET DYS 2  Fluid consistency: Thin  Diet effective now                   Consultants: General surgeon Interventional radiologist  Procedures: Paracentesis with  removal of 1,600 mL of fluid on 11/26/2023 Paracentesis with removal of 4.6 L of fluid on 12/01/2023    Medications:    amLODipine  10 mg Oral Daily   Chlorhexidine Gluconate Cloth  6 each Topical Q0600   [START ON 12/04/2023] epoetin alfa-epbx (RETACRIT) injection  4,000 Units Intravenous Q M,W,F-HD   feeding supplement (NEPRO CARB STEADY)  237 mL Oral BID BM   irbesartan  300 mg Oral Daily   lactulose  20 g Oral BID   metoprolol tartrate  25 mg Oral BID   pantoprazole  40 mg Oral Daily   tamsulosin  0.4 mg Oral Daily   Continuous Infusions:  ceFEPime (MAXIPIME) IV Stopped (12/01/23 2356)   metronidazole 500 mg (12/02/23 0842)     Anti-infectives (From admission, onward)    Start     Dose/Rate Route Frequency Ordered Stop   12/01/23 1445  metroNIDAZOLE (FLAGYL) IVPB 500 mg        500 mg 100 mL/hr over 60 Minutes Intravenous Every 12 hours 12/01/23 1354     11/28/23 2200  ceFEPIme (MAXIPIME) 1 g in sodium chloride 0.9 % 100 mL IVPB        1 g 200 mL/hr over 30 Minutes Intravenous Every 24 hours 11/28/23 2120     11/27/23 1300  cefTRIAXone (ROCEPHIN) 2 g in sodium chloride 0.9 % 100 mL IVPB  Status:  Discontinued        2 g 200 mL/hr over 30 Minutes Intravenous Every 24 hours 11/26/23 2027 11/28/23 2120   11/27/23 1200  vancomycin (VANCOREADY) IVPB 1500 mg/300 mL  Status:  Discontinued        1,500 mg 150 mL/hr over 120 Minutes Intravenous  Once 11/27/23 1131 11/27/23 1223   11/26/23 1345  cefTRIAXone (ROCEPHIN) 2 g in sodium chloride 0.9 % 100 mL IVPB        2 g 200 mL/hr over 30 Minutes Intravenous Once 11/26/23 1333 11/26/23 1432   11/26/23 1345  azithromycin (ZITHROMAX) 500 mg in sodium chloride 0.9 % 250 mL IVPB        500 mg 250 mL/hr over 60 Minutes Intravenous  Once 11/26/23 1333 11/26/23 1633              Family Communication/Anticipated D/C date and plan/Code Status   DVT prophylaxis: SCDs Start: 11/26/23 2029     Code Status: Full Code  Family  Communication: None Disposition Plan: Plan to discharge home   Status is: Inpatient Remains inpatient appropriate because:  remains on IV antibiotics       Subjective:   Pt in dialysis this AM. Resting comfortably.  No acute complaints.  Objective:    Vitals:   12/02/23 1200 12/02/23 1230 12/02/23 1300 12/02/23 1344  BP: 134/63 127/76 125/85 132/79  Pulse: 82  94 88  Resp: 14 15 18 18   Temp:    98 F (36.7 C)  TempSrc:    Oral  SpO2: 100% 100% 100% 100%  Weight:   58.5 kg   Height:       No data found.   Intake/Output Summary (Last 24 hours) at 12/02/2023 1525 Last data filed at 12/02/2023 1300 Gross per 24 hour  Intake 740 ml  Output 0 ml  Net 740 ml   [employer]  11/30/23 1750 12/02/23 0856 12/02/23 1300  Weight: 62.8 kg 58.5 kg 58.5 kg    Exam:  General exam: awake, alert, no acute distress Respiratory system: normal respiratory effort.on room air Cardiovascular system: RRR, no edema.   Gastrointestinal system: soft, NT Central nervous system: no gross focal neurologic deficits Extremities: no edema, normal tone Psychiatry: normal mood, congruent affect    Data Reviewed:   I have personally reviewed following labs and imaging studies:  Labs: Labs show the following:   Basic Metabolic Panel: Recent Labs  Lab 11/26/23 1122 11/27/23 0421 11/28/23 0827 11/30/23 1407 12/02/23 0905  NA 142 141 137 137 132*  K 4.5 5.0 4.0 4.7 3.7  CL 100 102 101 96* 97*  CO2 18* 17* 20* 16* 18*  GLUCOSE 112* 88 66* 93 106*  BUN 102* 116* 69* 94* 72*  CREATININE 11.92* 12.16* 7.24* 9.72* 7.29*  CALCIUM 9.4 8.7* 8.4* 8.6* 8.0*  PHOS  --   --   --  5.8* 5.1*   GFR Estimated Creatinine Clearance: 8.6 mL/min (A) (by C-G formula based on SCr of 7.29 mg/dL (H)). Liver Function Tests: Recent Labs  Lab 11/26/23 1122 11/27/23 0421 11/30/23 1407 12/02/23 0905  AST 30 24  --   --   ALT 16 14  --   --   ALKPHOS 96 70  --   --   BILITOT 2.2* 1.8*  --   --    PROT 7.5 6.7  --   --   ALBUMIN 2.8* 2.4* 2.4* 1.9*   Recent Labs  Lab 11/26/23 1122  LIPASE 35   Recent Labs  Lab 11/26/23 1921  AMMONIA 67*   Coagulation profile Recent Labs  Lab 11/26/23 1223  INR 1.9*    CBC: Recent Labs  Lab 11/26/23 1122 11/26/23 2344 11/27/23 0421 11/27/23 1125 11/28/23 0543 11/30/23 1407 12/02/23 0905  WBC 15.8*   < > 14.7* 14.0* 13.7* 11.5* 8.9  NEUTROABS 13.4*  --   --   --   --   --   --   HGB 11.0*   < > 9.7* 9.4* 9.0* 8.9* 8.2*  HCT 33.1*   < > 28.6* 28.4* 25.4* 26.9* 24.1*  MCV 92.5   < > 90.8 90.2 87.0 91.2 90.3  PLT 308   < > 241 248 205 190 140*   < > = values in this interval not displayed.    CBG: Recent Labs  Lab 11/28/23 1153 11/29/23 0938 11/30/23 0738 12/01/23 0724 12/01/23 1602  GLUCAP 70 122* 87 72 80     LOS: 6 days   Pennie Banter, DO  [facility]   Pager on [email or web address]. If 7PM-7AM, please contact night-coverage at [email or web address]     12/02/2023, 3:25 PM

## 2023-12-02 NOTE — Progress Notes (Signed)
Palliative Care Progress Note, Assessment & Plan   Patient Name: Casey Franco       Date: 12/02/2023 DOB: 12/28/1960  Age: 63 y.o. MRN#: [id number] Attending Physician: Pennie Banter, DO Primary Care Physician: [facility] Admit Date: 11/26/2023  Subjective: Patient is sitting up in bed in no apparent distress.  HD nurses at bedside monitoring HD catheter.  No family or friends present during my visit in HD suite.  HPI: 63 y.o. 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   Patient has been diagnosed with SBP after paracentesis.  He is also being treated for Bacteroides thetaiotaomicron bacteremia with IV cefepime and Flagyl.   PMT was consulted to support patient with goals of care discussions.  Summary of counseling/coordination of care: Extensive chart review completed prior to meeting patient including labs, vital signs, imaging, progress notes, orders, and available advanced directive documents from current and previous encounters.   After reviewing the patient's chart, I assessed patient at bedside in HD suite and discussed symptom management and goals of care.   Symptoms assessed.  Patient shares she feels fine and is ready to go home.  He has no acute complaints at this time.  He denies headache, nausea/vomiting, abdominal tenderness, and pain at this time.  No adjustment to [facility] needed.  I attempted to discuss values and goals important to the patient.  I again highlighted the importance of advance care planning documentation.  Patient shares he is just ready to go home right now.  I assured him that the  medical team is working to get him medically stable to discharge when appropriate.  I reminded patient of our discussions last week as well as with my colleague Lance Bosch, palliative NP, yesterday.  He remembers discussing placing his sister as his Oceanographer and that event that he is unable to speak for himself.  Spiritual care consult again placed for them to discuss advance care planning with patient.  I also introduced the copy of a MOST form to the patient.  He shares that he will hold onto it and look at it later.  He politely declined further elaboration of details of a MOST form at this time.  Additionally, I gave patient copy of hard choices for loving people.  I highlighted that this this book outlines key pieces of advance care planning that are applicable to patient.  He shares that he does not want to read it at this time.  He was agreeable for me to leave at bedside for him to review at a later date.  No adjustment to [facility] or plan of care at this time.  Full code and full scope remain.  PMT will step back from daily visits and monitor the patient peripherally.  We will reengage at patient/family's request, if acute palliative needs arise, or if patient's health deteriorates during this hospitalization.  Physical Exam Constitutional:      Comments: Thin, temporal wasting  Eyes:     General: Scleral icterus present.     Pupils: Pupils are equal,  round, and reactive to light.  Cardiovascular:     Rate and Rhythm: Normal rate.  Pulmonary:     Effort: Pulmonary effort is normal.  Abdominal:     Palpations: Abdomen is soft.  Skin:    General: Skin is warm and dry.  Neurological:     Mental Status: He is alert and oriented to person, place, and time.  Psychiatric:        Mood and Affect: Mood normal.        Behavior: Behavior normal.        Thought Content: Thought content normal.        Judgment: Judgment normal.             Total Time 35 minutes   Time spent  includes: Detailed review of medical records (labs, imaging, vital signs), medically appropriate exam (mental status, respiratory, cardiac, skin), discussed with treatment team, counseling and educating patient, family and staff, documenting clinical information, medication management and coordination of care.  Samara Deist L. Bonita Quin, DNP, FNP-BC Palliative Medicine Team

## 2023-12-03 DIAGNOSIS — J189 Pneumonia, unspecified organism: Secondary | ICD-10-CM | POA: Diagnosis not present

## 2023-12-03 DIAGNOSIS — N186 End stage renal disease: Secondary | ICD-10-CM | POA: Diagnosis not present

## 2023-12-03 DIAGNOSIS — A419 Sepsis, unspecified organism: Secondary | ICD-10-CM | POA: Diagnosis not present

## 2023-12-03 DIAGNOSIS — K652 Spontaneous bacterial peritonitis: Secondary | ICD-10-CM | POA: Diagnosis not present

## 2023-12-03 LAB — GLUCOSE, CAPILLARY
Glucose-Capillary: 70 mg/dL (ref 70–99)
Glucose-Capillary: 98 mg/dL (ref 70–99)

## 2023-12-03 LAB — CULTURE, BLOOD (ROUTINE X 2)
Culture: NO GROWTH
Culture: NO GROWTH

## 2023-12-03 MED ORDER — BIOTENE DRY MOUTH MT LIQD: 15.0000 mL | OROMUCOSAL | Status: DC | PRN

## 2023-12-03 NOTE — Plan of Care (Signed)

## 2023-12-03 NOTE — Progress Notes (Signed)
PT Cancellation Note  Patient Details Name: Casey Franco MRN: [id number] DOB: [birth year]   Cancelled Treatment:    Reason Eval/Treat Not Completed: Other (comment). Chart reviewed, treatment attempted. Pt in bed on entry, is not agreeable to any OOB mobility at this time. Pt is not receptive to conversation about mobility goals, is fairly dismissive to most points of our conversation. Pt is fixated on current mobility difficulty and voices an uncommonly quick adoption of this as his new current level of function. Pt reports unlikely he will ever walk again and says he has no goals for future mobility. Pt appears irritated by questioning. Author attempted to engage pt in self reflection, problem solving- grossly unsuccessful. Pt firmly reports he will not participate in any PT services at [facility] or overground. Author will try to come by again later in day and start fresh.    Alverna Fawley C 12/03/2023, 11:10 AM

## 2023-12-03 NOTE — Progress Notes (Signed)
Occupational Therapy Treatment Patient Details Name: Casey Franco MRN: [id number] DOB: Feb 27, 1961 Today's Date: 12/03/2023   History of present illness presented to ER secondary to abdominal pain, chest pain and coffee-ground emesis; admitted for management of spontaneous bacterial peritonitis.  s/p paracentesis with 1.6L removed on 2/13   OT comments  Pt received in bed, adamantly refusing OOB mobility this date. "I can't walk, I can't move", becomes irritated and raises voice with agitation when encouraged. "Y'all are stressing me out. I am not doing that". Oral care performed bed level with setup. OT will continue to attempt to progress mobility and functional independence as able, when pt agreeable to participate. Discharge recommendation appropriate.       If plan is discharge home, recommend the following:  A little help with walking and/or transfers;A lot of help with bathing/dressing/bathroom;Assistance with cooking/housework;Direct supervision/assist for financial management;Supervision due to cognitive status;Direct supervision/assist for medications management;Help with stairs or ramp for entrance   Equipment Recommendations  Other (comment)       Precautions / Restrictions Precautions Precautions: Fall Recall of Precautions/Restrictions: Impaired Restrictions Weight Bearing Restrictions Per Provider Order: No       Mobility Bed Mobility               General bed mobility comments: Refused    Transfers                   General transfer comment: Refused     Balance               Standing balance comment: Refused                           ADL either performed or assessed with clinical judgement   ADL Overall ADL's : Needs assistance/impaired     Grooming: Wash/dry face;Wash/dry hands;Oral care;Bed level Grooming Details (indicate cue type and reason): with encourgement, pt performs oral care and face washing bed level. requests  for OT to brush his teeth and wash his face - redirects to perform task with setup with max cuing. pt easily irritated, becoming agitated and raising his voice                               General ADL Comments: Bed level ADLs. Pt agitated easily, adamently refuses to perform OOB mobility.     Communication Communication Communication: No apparent difficulties   Cognition Arousal: Alert Behavior During Therapy: Agitated Cognition: Cognition impaired, Difficult to assess   Orientation impairments: Time, Situation Awareness: Intellectual awareness impaired Memory impairment (select all impairments): Short-term memory Attention impairment (select first level of impairment): Focused attention Executive functioning impairment (select all impairments): Initiation, Problem solving, Reasoning                   Following commands: Impaired Following commands impaired: Follows one step commands with increased time      Cueing   Cueing Techniques: Verbal cues, Tactile cues, Visual cues, Gestural cues             Pertinent Vitals/ Pain       Pain Assessment Pain Assessment: No/denies pain   Frequency  Min 1X/week        Progress Toward Goals  OT Goals(current goals can now be found in the care plan section)  Progress towards OT goals: Not progressing toward goals - comment (Pt self-limiting participation.)  Acute  Rehab OT Goals OT Goal Formulation: With patient Time For Goal Achievement: 12/15/23 Potential to Achieve Goals: Fair ADL Goals Pt Will Perform Upper Body Bathing: with set-up;sitting Pt Will Perform Lower Body Bathing: with supervision;sit to/from stand;sitting/lateral leans;with set-up Pt Will Perform Lower Body Dressing: with supervision;with set-up;sit to/from stand;sitting/lateral leans Pt Will Transfer to Toilet: with supervision;ambulating;regular height toilet Pt Will Perform Toileting - Clothing Manipulation and hygiene: with modified  independence;sitting/lateral leans;sit to/from stand  Plan         AM-PAC OT "6 Clicks" Daily Activity     Outcome Measure   Help from another person eating meals?: None Help from another person taking care of personal grooming?: None Help from another person toileting, which includes using toliet, bedpan, or urinal?: A Lot Help from another person bathing (including washing, rinsing, drying)?: A Lot Help from another person to put on and taking off regular upper body clothing?: A Little Help from another person to put on and taking off regular lower body clothing?: A Lot 6 Click Score: 17    End of Session    OT Visit Diagnosis: Other abnormalities of gait and mobility (R26.89);Unsteadiness on feet (R26.81);Muscle weakness (generalized) (M62.81)   Activity Tolerance Other (comment) (pt limits session by refusing futher participation and mobility.)   Patient Left in bed;with call bell/phone within reach;with bed alarm set   Nurse Communication Mobility status        Time: 9604-5409 OT Time Calculation (min): 9 min  Charges: OT General Charges $OT Visit: 1 Visit OT Treatments $Self Care/Home Management : 8-22 mins  Casey Franco L. Evin Loiseau, OTR/L  12/03/23, 1:47 PM

## 2023-12-03 NOTE — Progress Notes (Signed)
[facility]  ROUNDING NOTE   Subjective:   Casey Franco is a 63 y.o. male with past medical history of HTN, ESRD, DM-2, and Hep C. Patient presents to emergency department with abdominal pain. He has been admitted for Intractable vomiting [R11.10] Abdominal pain [R10.9] Pneumonia of both lungs due to infectious organism, unspecified part of lung [J18.9] Sepsis without acute organ dysfunction, due to unspecified organism [facility]) [A41.9]  Patient is known to our practice and receives outpatient dialysis at [facility] on a MWF, last treatment on Tuesday.    Patient seen sitting up in bed Continues to complain of abdominal soreness Room air, denies shortness of breath   Objective:  Vital signs in last 24 hours:  Temp:  [97.5 F (36.4 C)-98.3 F (36.8 C)] 97.9 F (36.6 C) (02/20 1226) Pulse Rate:  [73-88] 77 (02/20 1226) Resp:  [18] 18 (02/20 0400) BP: (104-132)/(71-81) 120/81 (02/20 1226) SpO2:  [94 %-100 %] 100 % (02/20 1226)  Weight change:  Filed Weights   11/30/23 1750 12/02/23 0856 12/02/23 1300  Weight: 62.8 kg 58.5 kg 58.5 kg    Intake/Output: I/O last 3 completed shifts: In: 1672.5 [P.O.:960; IV Piggyback:712.5] Out: 0    Intake/Output this shift:  No intake/output data recorded.  Physical Exam: General: NAD  Head: Normocephalic, atraumatic. Moist oral mucosal membranes  Eyes: Anicteric  Lungs:  Clear to auscultation, normal effort  Heart: Regular rate and rhythm  Abdomen:  Soft, tender, mild distension  Extremities: No peripheral edema.  Neurologic: Alert and oriented, moving all four extremities  Skin: No lesions  Access: Right chest tunneled catheter    Basic Metabolic Panel: Recent Labs  Lab 11/27/23 0421 11/28/23 0827 11/30/23 1407 12/02/23 0905  NA 141 137 137 132*  K 5.0 4.0 4.7 3.7  CL 102 101 96* 97*  CO2 17* 20* 16* 18*  GLUCOSE 88 66* 93 106*  BUN 116* 69* 94* 72*  CREATININE 12.16* 7.24* 9.72* 7.29*  CALCIUM  8.7* 8.4* 8.6* 8.0*  PHOS  --   --  5.8* 5.1*    Liver Function Tests: Recent Labs  Lab 11/27/23 0421 11/30/23 1407 12/02/23 0905  AST 24  --   --   ALT 14  --   --   ALKPHOS 70  --   --   BILITOT 1.8*  --   --   PROT 6.7  --   --   ALBUMIN 2.4* 2.4* 1.9*   No results for input(s): "LIPASE", "AMYLASE" in the last 168 hours.  Recent Labs  Lab 11/26/23 1921  AMMONIA 67*    CBC: Recent Labs  Lab 11/27/23 0421 11/27/23 1125 11/28/23 0543 11/30/23 1407 12/02/23 0905  WBC 14.7* 14.0* 13.7* 11.5* 8.9  HGB 9.7* 9.4* 9.0* 8.9* 8.2*  HCT 28.6* 28.4* 25.4* 26.9* 24.1*  MCV 90.8 90.2 87.0 91.2 90.3  PLT 241 248 205 190 140*    Cardiac Enzymes: No results for input(s): "CKTOTAL", "CKMB", "CKMBINDEX", "TROPONINI" in the last 168 hours.  BNP: Invalid input(s): "POCBNP"  CBG: Recent Labs  Lab 11/30/23 0738 12/01/23 0724 12/01/23 1602 12/03/23 0820 12/03/23 1223  GLUCAP 87 72 80 70 98    Microbiology: Results for orders placed or performed during the hospital encounter of 11/26/23  Culture, blood (Routine x 2)     Status: Abnormal   Collection Time: 11/26/23 11:22 AM   Specimen: BLOOD  Result Value Ref Range Status   Specimen Description   Final    BLOOD  RIGHT ANTECUBITAL Performed at [facility], [address] [place], [place] [address]    Special Requests   Final    BOTTLES DRAWN AEROBIC AND ANAEROBIC Blood Culture results may not be optimal due to an inadequate volume of blood received in culture bottles Performed at [facility], [address] [place], [place] [address]    Culture  Setup Time   Final    ANAEROBIC BOTTLE ONLY GRAM NEGATIVE RODS CRITICAL RESULT CALLED TO, READ BACK BY AND VERIFIED WITH: CAROLINE COULTER @ 11/28/23 2033 AB    Culture (A)  Final    BACTEROIDES THETAIOTAOMICRON BETA LACTAMASE POSITIVE Performed at [facility], [address], [place], [place] [address]    Report Status 12/01/2023 FINAL   Final  Blood Culture ID Panel (Reflexed)     Status: None   Collection Time: 11/26/23 11:22 AM  Result Value Ref Range Status   Enterococcus faecalis NOT DETECTED NOT DETECTED Final   Enterococcus Faecium NOT DETECTED NOT DETECTED Final   Listeria monocytogenes NOT DETECTED NOT DETECTED Final   Staphylococcus species NOT DETECTED NOT DETECTED Final   Staphylococcus aureus (BCID) NOT DETECTED NOT DETECTED Final   Staphylococcus epidermidis NOT DETECTED NOT DETECTED Final   Staphylococcus lugdunensis NOT DETECTED NOT DETECTED Final   Streptococcus species NOT DETECTED NOT DETECTED Final   Streptococcus agalactiae NOT DETECTED NOT DETECTED Final   Streptococcus pneumoniae NOT DETECTED NOT DETECTED Final   Streptococcus pyogenes NOT DETECTED NOT DETECTED Final   A.calcoaceticus-baumannii NOT DETECTED NOT DETECTED Final   Bacteroides fragilis NOT DETECTED NOT DETECTED Final   Enterobacterales NOT DETECTED NOT DETECTED Final   Enterobacter cloacae complex NOT DETECTED NOT DETECTED Final   Escherichia coli NOT DETECTED NOT DETECTED Final   Klebsiella aerogenes NOT DETECTED NOT DETECTED Final   Klebsiella oxytoca NOT DETECTED NOT DETECTED Final   Klebsiella pneumoniae NOT DETECTED NOT DETECTED Final   Proteus species NOT DETECTED NOT DETECTED Final   Salmonella species NOT DETECTED NOT DETECTED Final   Serratia marcescens NOT DETECTED NOT DETECTED Final   Haemophilus influenzae NOT DETECTED NOT DETECTED Final   Neisseria meningitidis NOT DETECTED NOT DETECTED Final   Pseudomonas aeruginosa NOT DETECTED NOT DETECTED Final   Stenotrophomonas maltophilia NOT DETECTED NOT DETECTED Final   Candida albicans NOT DETECTED NOT DETECTED Final   Candida auris NOT DETECTED NOT DETECTED Final   Candida glabrata NOT DETECTED NOT DETECTED Final   Candida krusei NOT DETECTED NOT DETECTED Final   Candida parapsilosis NOT DETECTED NOT DETECTED Final   Candida tropicalis NOT DETECTED NOT DETECTED Final    Cryptococcus neoformans/gattii NOT DETECTED NOT DETECTED Final    Comment: Performed at [facility], [address] [place], [place] [address]  Culture, blood (Routine x 2)     Status: Abnormal   Collection Time: 11/26/23 12:23 PM   Specimen: BLOOD LEFT ARM  Result Value Ref Range Status   Specimen Description   Final    BLOOD LEFT ARM Performed at [facility], [address], [place], [place] [address]    Special Requests   Final    BOTTLES DRAWN AEROBIC AND ANAEROBIC Blood Culture results may not be optimal due to an inadequate volume of blood received in culture bottles Performed at [facility], [address], [place], [place] [address]    Culture  Setup Time   Final    GRAM POSITIVE COCCI AEROBIC BOTTLE ONLY CRITICAL RESULT CALLED TO, READ BACK  BY AND VERIFIED WITH: BRIANA ALLEY ON 11/27/23 AT 1107 QSD    Culture (A)  Final    STAPHYLOCOCCUS EPIDERMIDIS THE SIGNIFICANCE OF ISOLATING THIS ORGANISM FROM A SINGLE SET OF BLOOD CULTURES WHEN MULTIPLE SETS ARE DRAWN IS UNCERTAIN. PLEASE NOTIFY THE MICROBIOLOGY DEPARTMENT WITHIN ONE WEEK IF SPECIATION AND SENSITIVITIES ARE REQUIRED. Performed at [facility], [address] [place], [place] [address]    Report Status 11/29/2023 FINAL  Final  Blood Culture ID Panel (Reflexed)     Status: Abnormal   Collection Time: 11/26/23 12:23 PM  Result Value Ref Range Status   Enterococcus faecalis NOT DETECTED NOT DETECTED Final   Enterococcus Faecium NOT DETECTED NOT DETECTED Final   Listeria monocytogenes NOT DETECTED NOT DETECTED Final   Staphylococcus species DETECTED (A) NOT DETECTED Final    Comment: CRITICAL RESULT CALLED TO, READ BACK BY AND VERIFIED WITH: BRIANA ALLEY ON 11/27/23 AT 1107 QSD    Staphylococcus aureus (BCID) NOT DETECTED NOT DETECTED Final   Staphylococcus epidermidis DETECTED (A) NOT DETECTED Final    Comment: CRITICAL RESULT CALLED TO, READ BACK BY AND VERIFIED WITH: BRIANA ALLEY ON  11/27/23 AT 1107 QSD    Staphylococcus lugdunensis NOT DETECTED NOT DETECTED Final   Streptococcus species NOT DETECTED NOT DETECTED Final   Streptococcus agalactiae NOT DETECTED NOT DETECTED Final   Streptococcus pneumoniae NOT DETECTED NOT DETECTED Final   Streptococcus pyogenes NOT DETECTED NOT DETECTED Final   A.calcoaceticus-baumannii NOT DETECTED NOT DETECTED Final   Bacteroides fragilis NOT DETECTED NOT DETECTED Final   Enterobacterales NOT DETECTED NOT DETECTED Final   Enterobacter cloacae complex NOT DETECTED NOT DETECTED Final   Escherichia coli NOT DETECTED NOT DETECTED Final   Klebsiella aerogenes NOT DETECTED NOT DETECTED Final   Klebsiella oxytoca NOT DETECTED NOT DETECTED Final   Klebsiella pneumoniae NOT DETECTED NOT DETECTED Final   Proteus species NOT DETECTED NOT DETECTED Final   Salmonella species NOT DETECTED NOT DETECTED Final   Serratia marcescens NOT DETECTED NOT DETECTED Final   Haemophilus influenzae NOT DETECTED NOT DETECTED Final   Neisseria meningitidis NOT DETECTED NOT DETECTED Final   Pseudomonas aeruginosa NOT DETECTED NOT DETECTED Final   Stenotrophomonas maltophilia NOT DETECTED NOT DETECTED Final   Candida albicans NOT DETECTED NOT DETECTED Final   Candida auris NOT DETECTED NOT DETECTED Final   Candida glabrata NOT DETECTED NOT DETECTED Final   Candida krusei NOT DETECTED NOT DETECTED Final   Candida parapsilosis NOT DETECTED NOT DETECTED Final   Candida tropicalis NOT DETECTED NOT DETECTED Final   Cryptococcus neoformans/gattii NOT DETECTED NOT DETECTED Final   Methicillin resistance mecA/C NOT DETECTED NOT DETECTED Final    Comment: Performed at [facility], [address] [place], [place] [address]  Body fluid culture w Gram Stain     Status: None   Collection Time: 11/26/23  8:55 PM   Specimen: PATH Cytology Peritoneal fluid  Result Value Ref Range Status   Specimen Description   Final    PERITONEAL CYTO Performed at [facility], [address] [place], [place] [address]    Special Requests   Final    NONE Performed at [facility], [address] [place], [place] [address]    Gram Stain   Final    FEW WBC PRESENT,BOTH PMN AND MONONUCLEAR FEW GRAM POSITIVE COCCI IN CHAINS MODERATE GRAM NEGATIVE RODS FEW GRAM POSITIVE RODS Performed at [facility], [address], [place], [place] [address]  Culture   Final    MODERATE STREPTOCOCCUS MITIS/ORALIS RARE CITROBACTER AMALONATICUS    Report Status 11/30/2023 FINAL  Final   Organism ID, Bacteria STREPTOCOCCUS MITIS/ORALIS  Final   Organism ID, Bacteria CITROBACTER AMALONATICUS  Final      Susceptibility   Citrobacter amalonaticus - MIC*    CEFEPIME <=0.12 SENSITIVE Sensitive     CEFTAZIDIME <=1 SENSITIVE Sensitive     CEFTRIAXONE <=0.25 SENSITIVE Sensitive     CIPROFLOXACIN <=0.25 SENSITIVE Sensitive     GENTAMICIN <=1 SENSITIVE Sensitive     IMIPENEM <=0.25 SENSITIVE Sensitive     TRIMETH/SULFA <=20 SENSITIVE Sensitive     PIP/TAZO <=4 SENSITIVE Sensitive ug/mL    * RARE CITROBACTER AMALONATICUS   Streptococcus mitis/oralis - MIC*    PENICILLIN 0.25 INTERMEDIATE Intermediate     CEFTRIAXONE 0.25 SENSITIVE Sensitive     LEVOFLOXACIN 1 SENSITIVE Sensitive     VANCOMYCIN 0.5 SENSITIVE Sensitive     * MODERATE STREPTOCOCCUS MITIS/ORALIS  Culture, blood (Routine X 2) w Reflex to ID Panel     Status: None   Collection Time: 11/28/23  5:43 AM   Specimen: BLOOD LEFT ARM  Result Value Ref Range Status   Specimen Description BLOOD LEFT ARM  Final   Special Requests   Final    BOTTLES DRAWN AEROBIC AND ANAEROBIC Blood Culture results may not be optimal due to an inadequate volume of blood received in culture bottles   Culture   Final    NO GROWTH 5 DAYS Performed at [facility], [address] [place], [place] [address]    Report Status 12/03/2023 FINAL  Final  Culture, blood (Routine X 2) w Reflex to ID Panel      Status: None   Collection Time: 11/28/23  5:47 AM   Specimen: BLOOD RIGHT HAND  Result Value Ref Range Status   Specimen Description BLOOD RIGHT HAND  Final   Special Requests   Final    BOTTLES DRAWN AEROBIC AND ANAEROBIC Blood Culture results may not be optimal due to an inadequate volume of blood received in culture bottles   Culture   Final    NO GROWTH 5 DAYS Performed at [facility], [address] [place], [place] [address]    Report Status 12/03/2023 FINAL  Final  Body fluid culture w Gram Stain     Status: None (Preliminary result)   Collection Time: 12/01/23 10:10 AM   Specimen: Ascitic; Body Fluid  Result Value Ref Range Status   Specimen Description   Final    ASCITIC Performed at [facility], [address] [place], [place] [address]    Special Requests   Final    peri Performed at [facility], [address] [place], [place] [address]    Gram Stain   Final    FEW WBC PRESENT, PREDOMINANTLY PMN ABUNDANT GRAM NEGATIVE RODS ABUNDANT GRAM POSITIVE COCCI RARE GRAM POSITIVE RODS    Culture   Final    FEW STREPTOCOCCUS MITIS/ORALIS RARE CITROBACTER AMALONATICUS CULTURE REINCUBATED FOR BETTER GROWTH MODERATE BACTEROIDES THETAIOTAOMICRON BETA LACTAMASE POSITIVE Performed at [facility], [address] [place], [place] [address]    Report Status PENDING  Incomplete    Coagulation Studies: No results for input(s): "LABPROT", "INR" in the last 72 hours.   Urinalysis: No results for input(s): "COLORURINE", "LABSPEC", "PHURINE", "GLUCOSEU", "HGBUR", "BILIRUBINUR", "KETONESUR", "PROTEINUR", "UROBILINOGEN", "NITRITE", "LEUKOCYTESUR" in the last 72 hours.  Invalid input(s): "APPERANCEUR"    Imaging: No  results found.     Medications:    ceFEPime (MAXIPIME) IV 200 mL/hr at 12/03/23 0100   metronidazole 500 mg (12/03/23 1021)    amLODipine  10 mg Oral Daily   Chlorhexidine Gluconate Cloth  6 each Topical Q0600    [START ON 12/04/2023] epoetin alfa-epbx (RETACRIT) injection  4,000 Units Intravenous Q M,W,F-HD   feeding supplement (NEPRO CARB STEADY)  237 mL Oral BID BM   irbesartan  300 mg Oral Daily   lactulose  20 g Oral BID   metoprolol tartrate  25 mg Oral BID   pantoprazole  40 mg Oral Daily   tamsulosin  0.4 mg Oral Daily   acetaminophen, alum & mag hydroxide-simeth, antiseptic oral rinse, hydrALAZINE, ondansetron (ZOFRAN) IV  Assessment/ Plan:  Mr. Casey Franco is a 63 y.o.  male with HTN, ESRD, DM-2, Hep C.  Patient presents with chest pain and has been admitted for Intractable vomiting [R11.10] Abdominal pain [R10.9] Pneumonia of both lungs due to infectious organism, unspecified part of lung [J18.9] Sepsis without acute organ dysfunction, due to unspecified organism [facility]) [A41.9]  [facility]/MWF/right chest PermCath/71.0 kg  End-stage renal disease on hemodialysis.  Next treatment scheduled for Friday.   2. Anemia of chronic kidney disease Lab Results  Component Value Date   HGB 8.2 (L) 12/02/2023    Patient does receive Mircera at outpatient clinic.  Continue low dose EPO with this dialysis  3. Secondary Hyperparathyroidism: with outpatient labs: PTH 351, phosphorus 6.6, calcium 8.1 on 11/23/2023.   Lab Results  Component Value Date   PTH 31 08/30/2018   CALCIUM 8.0 (L) 12/02/2023   PHOS 5.1 (H) 12/02/2023    Bone minerals currently acceptable.  Will continue to monitor.  4.  Hypertension with chronic kidney disease.  Home regimen includes furosemide, hydralazine, amlodipine, and irbesartan. Blood pressure acceptable, 120/81.  5.  Cirrhosis/ascites, spontaneous bacterial peritonitis. Paracentesis from 11/27/2023 yielded 1.6 L of fluid.  Complex loculations suspected as more ascites present. Microbiology results show Staph epidermidis in the blood. Peritoneal fluid culture from 11/26/2023 shows Streptococcus and Citrobacter. Currently getting  broad-spectrum antibiotics cefepime and metronidazole.Blood cultures negative. Also getting lactulose twice a day. Paracentesis on 12/02/23 with 4..6L removed.    LOS: 7 Mackinze Criado 2/20/20251:30 PM

## 2023-12-03 NOTE — Progress Notes (Signed)
Progress Note    Casey Franco  MVH:[id number] DOB: 1961-07-31  DOA: 11/26/2023 PCP: [facility]      Brief Narrative:    Medical records reviewed and are as summarized below:  Casey Franco is a 63 y.o. 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  He also complained of nausea, increasing abdominal distention and multiple episodes of vomiting.      Assessment/Plan:   Principal Problem:   Spontaneous bacterial peritonitis (HCC) Active Problems:   Abdominal pain   Cirrhosis of liver with ascites (HCC)   Myocardial injury   Essential hypertension   ESRD (end stage renal disease) (HCC)   Coffee ground emesis   Paroxysmal atrial fibrillation (HCC)   Sepsis without acute organ dysfunction (HCC)    Body mass index is 17.49 kg/m.   Spontaneous bacterial peritonitis Abdominal pain Liver cirrhosis with ascites: S/p paracentesis with removal of 1600 mL of fluid on 11/26/2023.   Fluid positive for SBP (ascitic fluid total nucleated cells 1,326, percentage neutrophils was 68% and absolute PMN count 901) Ascitic fluid culture showed Streptococcus mitis and Citrobacter amalonaticus S/p repeat paracentesis on 12/01/2023 with removal of 4.6 L of fluid. Percentage neutrophil count was 100 and total nucleated cell count 4,687. He was evaluated by the general surgeon.  No indication for surgery at this time.   --Continue IV Cefepime, Flagyl --Follow cultures  Staph epidermidis bacteremia: 1 out of 4 bottles from 11/26/2023 positive for Staph epidermidis.  This is likely a contaminant.  Repeat blood cultures from 11/27/2023 has not shown any growth thus far.  Bacteroides  thetaiotaomicron bacteremia (blood cultures from 11/26/2023 growing gram-negative rods).   --Continue IV  cefepime (day 6) --IV Flagyl added, continue  (day 3) --Dr. Myriam Forehand, prior attending had discussed with Casey Franco, ID pharmacist   Hematemesis: Resolved.  This is probably from multiple episodes of vomiting.   H&H is stable.   Elevated troponins: Troponin is 172, 138, 127 and 128.  No chest pain or acute EKG changes. Due to demand ischemia.   Paroxysmal atrial fibrillation, PVCs: Heart rate is better.  Continue metoprolol.  We discussed risks, benefits and alternatives to long-term anticoagulation for atrial fibrillation. He is at increased risk for bleeding from long-term anticoagulation because of underlying severe liver and kidney disease. CHA2DS2-VASc score is 3 and he is also at increased risk for stroke. He wants to consider Eliquis for stroke prophylaxis. --Resume Eliquis when closer to discharge and clear no additional procedures needed   Chronic systolic CHF Moderate mitral regurgitation 2D echo showed EF estimated at 40 to 45%, moderate LVH, indeterminate left ventricular diastolic parameters, mildly reduced RV systolic function, moderately enlarged RV size, moderately dilated right atrium, small pericardial effusion, moderate mitral regurgitation --Volume management by dialysis  Confusion on 11/30/2023, likely acute toxic encephalopathy from oxycodone: Mental status is better today.  Oxycodone has been discontinued. --Delirium precautions   ESRD on hemodialysis MWF --Nephrology following for hemodialysis   Dysphagia: Speech therapist recommended dysphagia 2 diet, thin liquids.   General weakness: PT recommended discharge to SNF.   Follow-up with TOC to assist with placement.   Comorbidities include hypertension, hyperlipidemia, BPH, type II DM   Goals of care Prognosis is guarded due to multiple chronic co-morbidities. Palliative care team consulted for goals of care discussions, now following  peripherally as goals are established and remain full code / full  scope.    Diet Order             DIET DYS 2 Fluid consistency: Thin  Diet effective now                   Consultants: General surgeon Interventional radiologist  Procedures: Paracentesis with removal of 1,600 mL of fluid on 11/26/2023 Paracentesis with removal of 4.6 L of fluid on 12/01/2023    Medications:    amLODipine  10 mg Oral Daily   Chlorhexidine Gluconate Cloth  6 each Topical Q0600   [START ON 12/04/2023] epoetin alfa-epbx (RETACRIT) injection  4,000 Units Intravenous Q M,W,F-HD   feeding supplement (NEPRO CARB STEADY)  237 mL Oral BID BM   irbesartan  300 mg Oral Daily   lactulose  20 g Oral BID   metoprolol tartrate  25 mg Oral BID   pantoprazole  40 mg Oral Daily   tamsulosin  0.4 mg Oral Daily   Continuous Infusions:  ceFEPime (MAXIPIME) IV 200 mL/hr at 12/03/23 0100   metronidazole 500 mg (12/03/23 1021)     Anti-infectives (From admission, onward)    Start     Dose/Rate Route Frequency Ordered Stop   12/01/23 1445  metroNIDAZOLE (FLAGYL) IVPB 500 mg        500 mg 100 mL/hr over 60 Minutes Intravenous Every 12 hours 12/01/23 1354     11/28/23 2200  ceFEPIme (MAXIPIME) 1 g in sodium chloride 0.9 % 100 mL IVPB        1 g 200 mL/hr over 30 Minutes Intravenous Every 24 hours 11/28/23 2120     11/27/23 1300  cefTRIAXone (ROCEPHIN) 2 g in sodium chloride 0.9 % 100 mL IVPB  Status:  Discontinued        2 g 200 mL/hr over 30 Minutes Intravenous Every 24 hours 11/26/23 2027 11/28/23 2120   11/27/23 1200  vancomycin (VANCOREADY) IVPB 1500 mg/300 mL  Status:  Discontinued        1,500 mg 150 mL/hr over 120 Minutes Intravenous  Once 11/27/23 1131 11/27/23 1223   11/26/23 1345  cefTRIAXone (ROCEPHIN) 2 g in sodium chloride 0.9 % 100 mL IVPB        2 g 200 mL/hr over 30 Minutes Intravenous Once 11/26/23 1333 11/26/23 1432   11/26/23 1345  azithromycin (ZITHROMAX) 500 mg in sodium chloride 0.9 % 250 mL IVPB        500 mg 250 mL/hr over 60 Minutes  Intravenous  Once 11/26/23 1333 11/26/23 1633              Family Communication/Anticipated D/C date and plan/Code Status   DVT prophylaxis: SCDs Start: 11/26/23 2029     Code Status: Full Code  Family Communication: None Disposition Plan: Plan to discharge home   Status is: Inpatient Remains inpatient appropriate because:  remains on IV antibiotics.     Subjective:   Pt awake resting in bed this AM.  He denies acute complaints including abdominal pain, N/V or F/C.   Objective:    Vitals:   12/02/23 2335 12/03/23 0400 12/03/23 0800 12/03/23 1226  BP: 106/71 104/72 104/77 120/81  Pulse: 78 75 73 77  Resp: 18 18    Temp: 97.7 F (36.5 C) 98.2 F (36.8 C) 98 F (36.7 C) 97.9 F (36.6 C)  TempSrc: Oral Oral Oral   SpO2: 100% 98% 95% 100%  Weight:  Height:       No data found.   Intake/Output Summary (Last 24 hours) at 12/03/2023 1422 Last data filed at 12/03/2023 0100 Gross per 24 hour  Intake 932.48 ml  Output --  Net 932.48 ml   Filed Weights   11/30/23 1750 12/02/23 0856 12/02/23 1300  Weight: 62.8 kg 58.5 kg 58.5 kg    Exam:  General exam: awake, alert, no acute distress Respiratory system: normal respiratory effort.on room air, lungs clear Cardiovascular system: RRR, no edema, right upper chest perm cath in place Gastrointestinal system: soft, NT, non-distended Central nervous system: no gross focal neurologic deficits, A&O x3 Extremities: no edema, normal tone Psychiatry: normal mood, congruent affect    Data Reviewed:   I have personally reviewed following labs and imaging studies:  Labs: Labs show the following:   Basic Metabolic Panel: Recent Labs  Lab 11/27/23 0421 11/28/23 0827 11/30/23 1407 12/02/23 0905  NA 141 137 137 132*  K 5.0 4.0 4.7 3.7  CL 102 101 96* 97*  CO2 17* 20* 16* 18*  GLUCOSE 88 66* 93 106*  BUN 116* 69* 94* 72*  CREATININE 12.16* 7.24* 9.72* 7.29*  CALCIUM 8.7* 8.4* 8.6* 8.0*  PHOS  --   --   5.8* 5.1*   GFR Estimated Creatinine Clearance: 8.6 mL/min (A) (by C-G formula based on SCr of 7.29 mg/dL (H)). Liver Function Tests: Recent Labs  Lab 11/27/23 0421 11/30/23 1407 12/02/23 0905  AST 24  --   --   ALT 14  --   --   ALKPHOS 70  --   --   BILITOT 1.8*  --   --   PROT 6.7  --   --   ALBUMIN 2.4* 2.4* 1.9*   No results for input(s): "LIPASE", "AMYLASE" in the last 168 hours.  Recent Labs  Lab 11/26/23 1921  AMMONIA 67*   Coagulation profile No results for input(s): "INR", "PROTIME" in the last 168 hours.   CBC: Recent Labs  Lab 11/27/23 0421 11/27/23 1125 11/28/23 0543 11/30/23 1407 12/02/23 0905  WBC 14.7* 14.0* 13.7* 11.5* 8.9  HGB 9.7* 9.4* 9.0* 8.9* 8.2*  HCT 28.6* 28.4* 25.4* 26.9* 24.1*  MCV 90.8 90.2 87.0 91.2 90.3  PLT 241 248 205 190 140*    CBG: Recent Labs  Lab 11/30/23 0738 12/01/23 0724 12/01/23 1602 12/03/23 0820 12/03/23 1223  GLUCAP 87 72 80 70 98     LOS: 7 days     Pennie Banter, DO  [facility]   Pager on [email or web address]. If 7PM-7AM, please contact night-coverage at [email or web address]     12/03/2023, 2:22 PM

## 2023-12-03 NOTE — Progress Notes (Signed)
Palliative Care Progress Note, Assessment & Plan   Patient Name: Casey Franco       Date: 12/03/2023 DOB: 1961/02/23  Age: 63 y.o. MRN#: [id number] Attending Physician: Pennie Banter, DO Primary Care Physician: [facility] Admit Date: 11/26/2023  Subjective: Patient is sitting up in bed in no apparent distress.  He acknowledges my presence and is able to make his wishes known.  He is alert and oriented x 4.  He remains focused on his phone and his gaze does not meet mine during the entirety of our visit.  No family or friends present during my visit.  HPI: 63 y.o. 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   Patient has been diagnosed with SBP after paracentesis.  He is also being treated for Bacteroides thetaiotaomicron bacteremia with IV cefepime and Flagyl.   PMT was consulted to support patient with goals of care discussions.  Summary of counseling/coordination of care: Extensive chart review completed prior to meeting patient including labs, vital signs, imaging, progress notes, orders, and available advanced directive documents from current and previous encounters.   After reviewing the patient's chart and assessing the patient at bedside, I spoke with patient in regards to symptom management and goals of care.   Was assessed.  Patient says that he is in no pain and "feels just fine".  He again shares he is ready to go home.  He is minimally interactive and complete symptom assessment was unable to be completed.  No adjustment to [facility] needed at this time.  I attempted to elicit values and goals important to  the patient.  He repeated "I am fine, I am fine, I am fine".  I shared my role is to be a guide on the side and to support him as he makes medical decisions and sets boundaries for medical care for himself, such as use of a ventilator, feeding, tube, NOK decision maker, etc. I highlighted that I had left paperwork at bedside for his review at his leisure to further reinforce importance of advanced care planning.   Therapeutic silence and active listening provided.  He shares he is not interested in talking about those things right now.  He shares he will "read the paperwork later".  He makes an waving motion towards the door.  I shared that PMT remains available to support patient throughout his hospitalization should he need our support.  He says "thank you now goodbye".  Given patient's resistance and avoidance to GOC discussions, PMT will step back from daily visits and monitor the patient peripherally.  Attending Dr. Denton Lank made aware.    Please reengage with PMT if goals change, at patient/family's request, or if patient's health deteriorates during hospitalization.  Physical Exam Vitals reviewed.  Constitutional:      General: He is not in acute distress.    Appearance: He is normal weight. He is not ill-appearing.  HENT:     Head:     Comments: Temporal wasting Eyes:     General: Scleral icterus present.  Cardiovascular:  Rate and Rhythm: Normal rate.  Pulmonary:     Effort: Pulmonary effort is normal.  Abdominal:     Palpations: Abdomen is soft.  Skin:    General: Skin is warm and dry.  Neurological:     Mental Status: He is alert and oriented to person, place, and time.  Psychiatric:        Mood and Affect: Mood normal.        Behavior: Behavior normal.        Thought Content: Thought content normal.        Judgment: Judgment normal.             Total Time 25 minutes   Time spent includes: Detailed review of medical records (labs, imaging, vital signs), medically  appropriate exam (mental status, respiratory, cardiac, skin), discussed with treatment team, counseling and educating patient, family and staff, documenting clinical information, medication management and coordination of care.  Samara Deist L. Bonita Quin, DNP, FNP-BC Palliative Medicine Team

## 2023-12-03 NOTE — TOC Progression Note (Signed)
Transition of Care [facility]) - Progression Note    Patient Details  Name: Casey Franco MRN: [id number] Date of Birth: Jul 08, 1961  Transition of [facility] CM/SW Contact  Truddie Hidden, RN Phone Number: 12/03/2023, 8:59 AM  Clinical Narrative:    Attempt to reach pattient's sister regarding a bedsearch no answer unable to leave a message. VM box is full.          Expected Discharge Plan and Services                                               Social Determinants of Health (SDOH) Interventions SDOH Screenings   Food Insecurity: Patient Declined (11/28/2023)  Housing: Patient Unable To Answer (11/28/2023)  Transportation Needs: Patient Declined (11/28/2023)  Utilities: Not At Risk (11/28/2023)  Financial Resource Strain: Low Risk  (05/15/2020)   Received from [facility]  Tobacco Use: Medium Risk (11/26/2023)    Readmission Risk Interventions     No data to display

## 2023-12-04 DIAGNOSIS — K652 Spontaneous bacterial peritonitis: Secondary | ICD-10-CM | POA: Diagnosis not present

## 2023-12-04 LAB — RENAL FUNCTION PANEL
Albumin: 1.8 g/dL — ABNORMAL LOW (ref 3.5–5.0)
Anion gap: 14 (ref 5–15)
BUN: 47 mg/dL — ABNORMAL HIGH (ref 8–23)
CO2: 21 mmol/L — ABNORMAL LOW (ref 22–32)
Calcium: 7.6 mg/dL — ABNORMAL LOW (ref 8.9–10.3)
Chloride: 97 mmol/L — ABNORMAL LOW (ref 98–111)
Creatinine, Ser: 6.05 mg/dL — ABNORMAL HIGH (ref 0.61–1.24)
GFR, Estimated: 10 mL/min — ABNORMAL LOW (ref >60)
Glucose, Bld: 79 mg/dL (ref 70–99)
Phosphorus: 5.1 mg/dL — ABNORMAL HIGH (ref 2.5–4.6)
Potassium: 3.4 mmol/L — ABNORMAL LOW (ref 3.5–5.1)
Sodium: 132 mmol/L — ABNORMAL LOW (ref 135–145)

## 2023-12-04 LAB — CBC
HCT: 23.5 % — ABNORMAL LOW (ref 39.0–52.0)
Hemoglobin: 8 g/dL — ABNORMAL LOW (ref 13.0–17.0)
MCH: 29.9 pg (ref 26.0–34.0)
MCHC: 34 g/dL (ref 30.0–36.0)
MCV: 87.7 fL (ref 80.0–100.0)
Platelets: 149 10*3/uL — ABNORMAL LOW (ref 150–400)
RBC: 2.68 MIL/uL — ABNORMAL LOW (ref 4.22–5.81)
RDW: 15.9 % — ABNORMAL HIGH (ref 11.5–15.5)
WBC: 7.4 10*3/uL (ref 4.0–10.5)
nRBC: 0 % (ref 0.0–0.2)

## 2023-12-04 LAB — GLUCOSE, CAPILLARY: Glucose-Capillary: 69 mg/dL — ABNORMAL LOW (ref 70–99)

## 2023-12-04 MED ORDER — EPOETIN ALFA-EPBX 4000 UNIT/ML IJ SOLN
INTRAMUSCULAR | Status: AC
Start: 2023-12-04 — End: ?
  Filled 2023-12-04: qty 1

## 2023-12-04 MED ORDER — HYDROXYZINE HCL 10 MG PO TABS
10.0000 mg | ORAL_TABLET | Freq: Four times a day (QID) | ORAL | Status: DC | PRN
  Administered 2023-12-05 – 2023-12-21 (×4): 10 mg via ORAL
  Filled 2023-12-04 (×6): qty 1

## 2023-12-04 MED ORDER — HEPARIN SODIUM (PORCINE) 1000 UNIT/ML IJ SOLN
INTRAMUSCULAR | Status: AC
  Filled 2023-12-04: qty 10

## 2023-12-04 NOTE — Progress Notes (Signed)
Received patient in bed to unit.  Alert and oriented.  Informed consent signed and in chart.   TX duration: 3.5 hours  Patient tolerated well.  Transported back to the room  Alert, without acute distress.  Hand-off given to patient's nurse.   Access used: RIJ  Access issues: none  Total UF removed: 0 Medication(s) given: EPO Post HD weight: 57.1kg  Freddie Breech, RN [facility]

## 2023-12-04 NOTE — Progress Notes (Signed)
PT Cancellation Note  Patient Details Name: Casey Franco MRN: [id number] DOB: 02-05-1961   Cancelled Treatment:     Pt off floor at HD. Will re-attempt next available date/time per POC.   Jannet Askew 12/04/2023, 3:23 PM

## 2023-12-04 NOTE — Progress Notes (Signed)
OT Cancellation Note  Patient Details Name: Casey Franco MRN: [id number] DOB: [birth year]   Cancelled Treatment:    Reason Eval/Treat Not Completed: Patient at procedure or test/ unavailable. Pt currently off the unit for HT. OT will continue to follow, and attempt when able.  Yareliz Thorstenson L. Hanadi Stanly, OTR/L  12/04/23, 11:34 AM

## 2023-12-04 NOTE — Progress Notes (Addendum)
[facility]  ROUNDING NOTE   Subjective:   Casey Franco is a 63 y.o. male with past medical history of HTN, ESRD, DM-2, and Hep C. Patient presents to emergency department with abdominal pain. He has been admitted for Intractable vomiting [R11.10] Abdominal pain [R10.9] Pneumonia of both lungs due to infectious organism, unspecified part of lung [J18.9] Sepsis without acute organ dysfunction, due to unspecified organism [facility]) [A41.9]  Patient is known to our practice and receives outpatient dialysis at [facility] on a MWF, last treatment on Tuesday.    Patient seen and evaluated during dialysis   HEMODIALYSIS FLOWSHEET:  Blood Flow Rate (mL/min): 350 mL/min Arterial Pressure (mmHg): -199.59 mmHg Venous Pressure (mmHg): 122.21 mmHg TMP (mmHg): 3.84 mmHg Ultrafiltration Rate (mL/min): 171 mL/min Dialysate Flow Rate (mL/min): 300 ml/min  Continues to have abd soreness Denies shortness of breath   Objective:  Vital signs in last 24 hours:  Temp:  [96.9 F (36.1 C)-98.9 F (37.2 C)] 96.9 F (36.1 C) (02/21 0931) Pulse Rate:  [75-77] 77 (02/21 1230) Resp:  [12-37] 14 (02/21 1230) BP: (96-124)/(72-81) 120/79 (02/21 1230) SpO2:  [96 %-100 %] 100 % (02/21 1230) Weight:  [57.1 kg] 57.1 kg (02/21 0931)  Weight change:  Filed Weights   12/02/23 0856 12/02/23 1300 12/04/23 0931  Weight: 58.5 kg 58.5 kg 57.1 kg    Intake/Output: I/O last 3 completed shifts: In: 1273.6 [P.O.:1080; IV Piggyback:193.6] Out: -    Intake/Output this shift:  No intake/output data recorded.  Physical Exam: General: NAD  Head: Normocephalic, atraumatic. Moist oral mucosal membranes  Eyes: Anicteric  Lungs:  Clear to auscultation, normal effort  Heart: Regular rate and rhythm, aflutter  Abdomen:  Soft, tender, mild distension  Extremities: No peripheral edema.  Neurologic: Alert and oriented, moving all four extremities  Skin: No lesions  Access: Right chest tunneled  catheter    Basic Metabolic Panel: Recent Labs  Lab 11/28/23 0827 11/30/23 1407 12/02/23 0905 12/04/23 0547  NA 137 137 132* 132*  K 4.0 4.7 3.7 3.4*  CL 101 96* 97* 97*  CO2 20* 16* 18* 21*  GLUCOSE 66* 93 106* 79  BUN 69* 94* 72* 47*  CREATININE 7.24* 9.72* 7.29* 6.05*  CALCIUM 8.4* 8.6* 8.0* 7.6*  PHOS  --  5.8* 5.1* 5.1*    Liver Function Tests: Recent Labs  Lab 11/30/23 1407 12/02/23 0905 12/04/23 0547  ALBUMIN 2.4* 1.9* 1.8*   No results for input(s): "LIPASE", "AMYLASE" in the last 168 hours.  No results for input(s): "AMMONIA" in the last 168 hours.   CBC: Recent Labs  Lab 11/28/23 0543 11/30/23 1407 12/02/23 0905 12/04/23 0547  WBC 13.7* 11.5* 8.9 7.4  HGB 9.0* 8.9* 8.2* 8.0*  HCT 25.4* 26.9* 24.1* 23.5*  MCV 87.0 91.2 90.3 87.7  PLT 205 190 140* 149*    Cardiac Enzymes: No results for input(s): "CKTOTAL", "CKMB", "CKMBINDEX", "TROPONINI" in the last 168 hours.  BNP: Invalid input(s): "POCBNP"  CBG: Recent Labs  Lab 11/30/23 0738 12/01/23 0724 12/01/23 1602 12/03/23 0820 12/03/23 1223  GLUCAP 87 72 80 70 98    Microbiology: Results for orders placed or performed during the hospital encounter of 11/26/23  Culture, blood (Routine x 2)     Status: Abnormal   Collection Time: 11/26/23 11:22 AM   Specimen: BLOOD  Result Value Ref Range Status   Specimen Description   Final    BLOOD RIGHT ANTECUBITAL Performed at [facility], [address]  [place], [place] [address]    Special Requests   Final    BOTTLES DRAWN AEROBIC AND ANAEROBIC Blood Culture results may not be optimal due to an inadequate volume of blood received in culture bottles Performed at [facility] [address] [place], [place] [address]    Culture  Setup Time   Final    ANAEROBIC BOTTLE ONLY GRAM NEGATIVE RODS CRITICAL RESULT CALLED TO, READ BACK BY AND VERIFIED WITH: CAROLINE COULTER @ 11/28/23 2033 AB    Culture (A)  Final    BACTEROIDES  THETAIOTAOMICRON BETA LACTAMASE POSITIVE Performed at [facility], [address], [place], [place] [address]    Report Status 12/01/2023 FINAL  Final  Blood Culture ID Panel (Reflexed)     Status: None   Collection Time: 11/26/23 11:22 AM  Result Value Ref Range Status   Enterococcus faecalis NOT DETECTED NOT DETECTED Final   Enterococcus Faecium NOT DETECTED NOT DETECTED Final   Listeria monocytogenes NOT DETECTED NOT DETECTED Final   Staphylococcus species NOT DETECTED NOT DETECTED Final   Staphylococcus aureus (BCID) NOT DETECTED NOT DETECTED Final   Staphylococcus epidermidis NOT DETECTED NOT DETECTED Final   Staphylococcus lugdunensis NOT DETECTED NOT DETECTED Final   Streptococcus species NOT DETECTED NOT DETECTED Final   Streptococcus agalactiae NOT DETECTED NOT DETECTED Final   Streptococcus pneumoniae NOT DETECTED NOT DETECTED Final   Streptococcus pyogenes NOT DETECTED NOT DETECTED Final   A.calcoaceticus-baumannii NOT DETECTED NOT DETECTED Final   Bacteroides fragilis NOT DETECTED NOT DETECTED Final   Enterobacterales NOT DETECTED NOT DETECTED Final   Enterobacter cloacae complex NOT DETECTED NOT DETECTED Final   Escherichia coli NOT DETECTED NOT DETECTED Final   Klebsiella aerogenes NOT DETECTED NOT DETECTED Final   Klebsiella oxytoca NOT DETECTED NOT DETECTED Final   Klebsiella pneumoniae NOT DETECTED NOT DETECTED Final   Proteus species NOT DETECTED NOT DETECTED Final   Salmonella species NOT DETECTED NOT DETECTED Final   Serratia marcescens NOT DETECTED NOT DETECTED Final   Haemophilus influenzae NOT DETECTED NOT DETECTED Final   Neisseria meningitidis NOT DETECTED NOT DETECTED Final   Pseudomonas aeruginosa NOT DETECTED NOT DETECTED Final   Stenotrophomonas maltophilia NOT DETECTED NOT DETECTED Final   Candida albicans NOT DETECTED NOT DETECTED Final   Candida auris NOT DETECTED NOT DETECTED Final   Candida glabrata NOT DETECTED NOT DETECTED Final    Candida krusei NOT DETECTED NOT DETECTED Final   Candida parapsilosis NOT DETECTED NOT DETECTED Final   Candida tropicalis NOT DETECTED NOT DETECTED Final   Cryptococcus neoformans/gattii NOT DETECTED NOT DETECTED Final    Comment: Performed at [facility], [address] [place], [place] [address]  Culture, blood (Routine x 2)     Status: Abnormal   Collection Time: 11/26/23 12:23 PM   Specimen: BLOOD LEFT ARM  Result Value Ref Range Status   Specimen Description   Final    BLOOD LEFT ARM Performed at [facility], [address], [place], [place] [address]    Special Requests   Final    BOTTLES DRAWN AEROBIC AND ANAEROBIC Blood Culture results may not be optimal due to an inadequate volume of blood received in culture bottles Performed at [facility], [address] [place], [place] [address]    Culture  Setup Time   Final    GRAM POSITIVE COCCI AEROBIC BOTTLE ONLY CRITICAL RESULT CALLED TO, READ BACK BY AND VERIFIED WITH: BRIANA ALLEY ON 11/27/23 AT 1107 QSD  Culture (A)  Final    STAPHYLOCOCCUS EPIDERMIDIS THE SIGNIFICANCE OF ISOLATING THIS ORGANISM FROM A SINGLE SET OF BLOOD CULTURES WHEN MULTIPLE SETS ARE DRAWN IS UNCERTAIN. PLEASE NOTIFY THE MICROBIOLOGY DEPARTMENT WITHIN ONE WEEK IF SPECIATION AND SENSITIVITIES ARE REQUIRED. Performed at [facility], [address] [place], [place] [address]    Report Status 11/29/2023 FINAL  Final  Blood Culture ID Panel (Reflexed)     Status: Abnormal   Collection Time: 11/26/23 12:23 PM  Result Value Ref Range Status   Enterococcus faecalis NOT DETECTED NOT DETECTED Final   Enterococcus Faecium NOT DETECTED NOT DETECTED Final   Listeria monocytogenes NOT DETECTED NOT DETECTED Final   Staphylococcus species DETECTED (A) NOT DETECTED Final    Comment: CRITICAL RESULT CALLED TO, READ BACK BY AND VERIFIED WITH: BRIANA ALLEY ON 11/27/23 AT 1107 QSD    Staphylococcus aureus (BCID) NOT DETECTED NOT  DETECTED Final   Staphylococcus epidermidis DETECTED (A) NOT DETECTED Final    Comment: CRITICAL RESULT CALLED TO, READ BACK BY AND VERIFIED WITH: BRIANA ALLEY ON 11/27/23 AT 1107 QSD    Staphylococcus lugdunensis NOT DETECTED NOT DETECTED Final   Streptococcus species NOT DETECTED NOT DETECTED Final   Streptococcus agalactiae NOT DETECTED NOT DETECTED Final   Streptococcus pneumoniae NOT DETECTED NOT DETECTED Final   Streptococcus pyogenes NOT DETECTED NOT DETECTED Final   A.calcoaceticus-baumannii NOT DETECTED NOT DETECTED Final   Bacteroides fragilis NOT DETECTED NOT DETECTED Final   Enterobacterales NOT DETECTED NOT DETECTED Final   Enterobacter cloacae complex NOT DETECTED NOT DETECTED Final   Escherichia coli NOT DETECTED NOT DETECTED Final   Klebsiella aerogenes NOT DETECTED NOT DETECTED Final   Klebsiella oxytoca NOT DETECTED NOT DETECTED Final   Klebsiella pneumoniae NOT DETECTED NOT DETECTED Final   Proteus species NOT DETECTED NOT DETECTED Final   Salmonella species NOT DETECTED NOT DETECTED Final   Serratia marcescens NOT DETECTED NOT DETECTED Final   Haemophilus influenzae NOT DETECTED NOT DETECTED Final   Neisseria meningitidis NOT DETECTED NOT DETECTED Final   Pseudomonas aeruginosa NOT DETECTED NOT DETECTED Final   Stenotrophomonas maltophilia NOT DETECTED NOT DETECTED Final   Candida albicans NOT DETECTED NOT DETECTED Final   Candida auris NOT DETECTED NOT DETECTED Final   Candida glabrata NOT DETECTED NOT DETECTED Final   Candida krusei NOT DETECTED NOT DETECTED Final   Candida parapsilosis NOT DETECTED NOT DETECTED Final   Candida tropicalis NOT DETECTED NOT DETECTED Final   Cryptococcus neoformans/gattii NOT DETECTED NOT DETECTED Final   Methicillin resistance mecA/C NOT DETECTED NOT DETECTED Final    Comment: Performed at [facility], [address] [place], [place] [address]  Body fluid culture w Gram Stain     Status: None   Collection Time:  11/26/23  8:55 PM   Specimen: PATH Cytology Peritoneal fluid  Result Value Ref Range Status   Specimen Description   Final    PERITONEAL CYTO Performed at [facility], [address] [place], [place] [address]    Special Requests   Final    NONE Performed at [facility], [address] [place], [place] [address]    Gram Stain   Final    FEW WBC PRESENT,BOTH PMN AND MONONUCLEAR FEW GRAM POSITIVE COCCI IN CHAINS MODERATE GRAM NEGATIVE RODS FEW GRAM POSITIVE RODS Performed at [facility], [address], [place], [place] [address]    Culture   Final    MODERATE STREPTOCOCCUS MITIS/ORALIS RARE CITROBACTER  AMALONATICUS    Report Status 11/30/2023 FINAL  Final   Organism ID, Bacteria STREPTOCOCCUS MITIS/ORALIS  Final   Organism ID, Bacteria CITROBACTER AMALONATICUS  Final      Susceptibility   Citrobacter amalonaticus - MIC*    CEFEPIME <=0.12 SENSITIVE Sensitive     CEFTAZIDIME <=1 SENSITIVE Sensitive     CEFTRIAXONE <=0.25 SENSITIVE Sensitive     CIPROFLOXACIN <=0.25 SENSITIVE Sensitive     GENTAMICIN <=1 SENSITIVE Sensitive     IMIPENEM <=0.25 SENSITIVE Sensitive     TRIMETH/SULFA <=20 SENSITIVE Sensitive     PIP/TAZO <=4 SENSITIVE Sensitive ug/mL    * RARE CITROBACTER AMALONATICUS   Streptococcus mitis/oralis - MIC*    PENICILLIN 0.25 INTERMEDIATE Intermediate     CEFTRIAXONE 0.25 SENSITIVE Sensitive     LEVOFLOXACIN 1 SENSITIVE Sensitive     VANCOMYCIN 0.5 SENSITIVE Sensitive     * MODERATE STREPTOCOCCUS MITIS/ORALIS  Culture, blood (Routine X 2) w Reflex to ID Panel     Status: None   Collection Time: 11/28/23  5:43 AM   Specimen: BLOOD LEFT ARM  Result Value Ref Range Status   Specimen Description BLOOD LEFT ARM  Final   Special Requests   Final    BOTTLES DRAWN AEROBIC AND ANAEROBIC Blood Culture results may not be optimal due to an inadequate volume of blood received in culture bottles   Culture   Final    NO GROWTH 5 DAYS Performed  at [facility], [address] [place], [place] [address]    Report Status 12/03/2023 FINAL  Final  Culture, blood (Routine X 2) w Reflex to ID Panel     Status: None   Collection Time: 11/28/23  5:47 AM   Specimen: BLOOD RIGHT HAND  Result Value Ref Range Status   Specimen Description BLOOD RIGHT HAND  Final   Special Requests   Final    BOTTLES DRAWN AEROBIC AND ANAEROBIC Blood Culture results may not be optimal due to an inadequate volume of blood received in culture bottles   Culture   Final    NO GROWTH 5 DAYS Performed at [facility], [address] [place], [place] [address]    Report Status 12/03/2023 FINAL  Final  Body fluid culture w Gram Stain     Status: None (Preliminary result)   Collection Time: 12/01/23 10:10 AM   Specimen: Ascitic; Body Fluid  Result Value Ref Range Status   Specimen Description   Final    ASCITIC Performed at [facility], [address] [place], [place] [address]    Special Requests   Final    peri Performed at [facility], [address] [place], [place] [address]    Gram Stain   Final    FEW WBC PRESENT, PREDOMINANTLY PMN ABUNDANT GRAM NEGATIVE RODS ABUNDANT GRAM POSITIVE COCCI RARE GRAM POSITIVE RODS    Culture   Final    FEW STREPTOCOCCUS MITIS/ORALIS RARE CITROBACTER AMALONATICUS SUSCEPTIBILITIES TO FOLLOW MODERATE BACTEROIDES THETAIOTAOMICRON BETA LACTAMASE POSITIVE RARE EIKENELLA CORRODENS Usually susceptible to penicillin and other beta lactam agents,quinolones,macrolides and tetracyclines. Performed at [facility], [address] [place], [place] [address]    Report Status PENDING  Incomplete    Coagulation Studies: No results for input(s): "LABPROT", "INR" in the last 72 hours.   Urinalysis: No results for input(s): "COLORURINE", "LABSPEC", "PHURINE", "GLUCOSEU", "HGBUR", "BILIRUBINUR", "KETONESUR", "PROTEINUR", "UROBILINOGEN", "NITRITE", "LEUKOCYTESUR" in the last  72 hours.  Invalid input(s): "APPERANCEUR"    Imaging:  No results found.     Medications:    ceFEPime (MAXIPIME) IV 1 g (12/03/23 2156)   metronidazole 500 mg (12/03/23 2153)    amLODipine  10 mg Oral Daily   Chlorhexidine Gluconate Cloth  6 each Topical Q0600   epoetin alfa-epbx (RETACRIT) injection  4,000 Units Intravenous Q M,W,F-HD   feeding supplement (NEPRO CARB STEADY)  237 mL Oral BID BM   irbesartan  300 mg Oral Daily   lactulose  20 g Oral BID   metoprolol tartrate  25 mg Oral BID   pantoprazole  40 mg Oral Daily   tamsulosin  0.4 mg Oral Daily   acetaminophen, alum & mag hydroxide-simeth, antiseptic oral rinse, hydrALAZINE, ondansetron (ZOFRAN) IV  Assessment/ Plan:  Mr. Casey Franco is a 63 y.o.  male with HTN, ESRD, DM-2, Hep C.  Patient presents with chest pain and has been admitted for Intractable vomiting [R11.10] Abdominal pain [R10.9] Pneumonia of both lungs due to infectious organism, unspecified part of lung [J18.9] Sepsis without acute organ dysfunction, due to unspecified organism [facility]) [A41.9]  [facility]/MWF/right chest PermCath/71.0 kg  End-stage renal disease on hemodialysis.  Receiving treatment today, UF 0. Next treatment scheduled for Monday  2. Anemia of chronic kidney disease Lab Results  Component Value Date   HGB 8.0 (L) 12/04/2023    Patient does receive Mircera at outpatient clinic.  Continue EPO with this dialysis  3. Secondary Hyperparathyroidism: with outpatient labs: PTH 351, phosphorus 6.6, calcium 8.1 on 11/23/2023.   Lab Results  Component Value Date   PTH 31 08/30/2018   CALCIUM 7.6 (L) 12/04/2023   PHOS 5.1 (H) 12/04/2023     Will continue to monitor.  4.  Hypertension with chronic kidney disease.  Home regimen includes furosemide, hydralazine, amlodipine, and irbesartan. Blood pressure 122/79 during dialysis.   5.  Cirrhosis/ascites, spontaneous bacterial peritonitis. Paracentesis from  11/27/2023 yielded 1.6 L of fluid.  Complex loculations suspected as more ascites present. Microbiology results show Staph epidermidis in the blood. Peritoneal fluid culture from 11/26/2023 shows Streptococcus and Citrobacter. Currently getting broad-spectrum antibiotics cefepime and metronidazole.Blood cultures negative. Also getting lactulose twice a day. Paracentesis on 12/02/23 with 4..6L removed.    LOS: 8 Ozro Russett 2/21/202512:38 PM

## 2023-12-04 NOTE — Progress Notes (Addendum)
 Pt resting comfortably in bed, bed alarm set. No concerns at this time.

## 2023-12-04 NOTE — TOC Progression Note (Signed)
Transition of Care [facility]) - Progression Note    Patient Details  Name: Casey Franco MRN: [id number] Date of Birth: 1960-10-31  Transition of Care [facility]) CM/SW Contact  Truddie Hidden, RN Phone Number: 12/04/2023, 3:32 PM  Clinical Narrative:    Spoke with patient at bedside and his sister, Casey Franco, by phone at bedside. RNCM advised of therapy's recommendation for SNF. Patient is agreeable. Discussed options for local SNF's. Patient sister would like [facility] as first choice and [facility] as second choice. She and patient advised once bed is secured, authorization is required via payor.   Bed search initiated.         Expected Discharge Plan and Services                                               Social Determinants of Health (SDOH) Interventions SDOH Screenings   Food Insecurity: Patient Declined (11/28/2023)  Housing: Patient Unable To Answer (11/28/2023)  Transportation Needs: Patient Declined (11/28/2023)  Utilities: Not At Risk (11/28/2023)  Financial Resource Strain: Low Risk  (05/15/2020)   Received from [facility]  Tobacco Use: Medium Risk (11/26/2023)    Readmission Risk Interventions     No data to display

## 2023-12-04 NOTE — Progress Notes (Signed)
Progress Note    Casey Franco  WUJ:[id number] DOB: 10/10/1961  DOA: 11/26/2023 PCP: [facility]      Brief Narrative:    Medical records reviewed and are as summarized below:  Casey Franco is a 63 y.o. 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  He also complained of nausea, increasing abdominal distention and multiple episodes of vomiting.      Assessment/Plan:   Principal Problem:   Spontaneous bacterial peritonitis (HCC) Active Problems:   Abdominal pain   Cirrhosis of liver with ascites (HCC)   Myocardial injury   Essential hypertension   ESRD (end stage renal disease) (HCC)   Coffee ground emesis   Paroxysmal atrial fibrillation (HCC)   Sepsis without acute organ dysfunction (HCC)    Body mass index is 17.07 kg/m.   Spontaneous bacterial peritonitis Abdominal pain Liver cirrhosis with ascites: S/p paracentesis with removal of 1600 mL of fluid on 11/26/2023.   Fluid positive for SBP (ascitic fluid total nucleated cells 1,326, percentage neutrophils was 68% and absolute PMN count 901) Ascitic fluid culture showed Streptococcus mitis and Citrobacter amalonaticus S/p repeat paracentesis on 12/01/2023 with removal of 4.6 L of fluid. Percentage neutrophil count was 100 and total nucleated cell count 4,687. He was evaluated by the general surgeon.  No indication for surgery at this time.   --Continue IV Cefepime, Flagyl --Transition to PO antibiotics tomorrow to complete course --Follow cultures  Staph epidermidis bacteremia: 1 out of 4 bottles from 11/26/2023 positive for Staph epidermidis.  This is likely a contaminant.  Repeat blood cultures from 11/27/2023 has not shown any growth thus far.  Bacteroides  thetaiotaomicron bacteremia (blood cultures  from 11/26/2023 growing gram-negative rods).   --Continue IV cefepime (day 6) --IV Flagyl added, continue  (day 3) --Dr. Myriam Forehand, prior attending had discussed with [name], ID pharmacist   Hematemesis: Resolved.  This is probably from multiple episodes of vomiting.   H&H is stable.   Elevated troponins: Troponin is 172, 138, 127 and 128.  No chest pain or acute EKG changes. Due to demand ischemia.   Paroxysmal atrial fibrillation, PVCs: Heart rate is better.  Continue metoprolol.  We discussed risks, benefits and alternatives to long-term anticoagulation for atrial fibrillation. He is at increased risk for bleeding from long-term anticoagulation because of underlying severe liver and kidney disease. CHA2DS2-VASc score is 3 and he is also at increased risk for stroke. He wants to consider Eliquis for stroke prophylaxis. --Resume Eliquis when closer to discharge and clear no additional procedures needed   Chronic systolic CHF Moderate mitral regurgitation 2D echo showed EF estimated at 40 to 45%, moderate LVH, indeterminate left ventricular diastolic parameters, mildly reduced RV systolic function, moderately enlarged RV size, moderately dilated right atrium, small pericardial effusion, moderate mitral regurgitation --Volume management by dialysis  Confusion on 11/30/2023, likely acute toxic encephalopathy from oxycodone: Mental status is better today.  Oxycodone has been discontinued. --Delirium precautions   ESRD on hemodialysis MWF --Nephrology following for hemodialysis   Dysphagia: Speech therapist recommended dysphagia 2 diet, thin liquids.   General weakness: PT recommended discharge to SNF.   Follow-up with TOC to assist with placement.   Comorbidities include hypertension, hyperlipidemia, BPH, type II DM   Goals of care Prognosis is guarded due to multiple chronic co-morbidities. Palliative care team  consulted for goals of care discussions, now following peripherally  as goals are established and remain full code / full scope.    Diet Order             DIET DYS 2 Fluid consistency: Thin  Diet effective now                   Consultants: General surgery Interventional radiology  Procedures: Paracentesis with removal of 1,600 mL of fluid on 11/26/2023 Paracentesis with removal of 4.6 L of fluid on 12/01/2023    Medications:    amLODipine  10 mg Oral Daily   Chlorhexidine Gluconate Cloth  6 each Topical Q0600   epoetin alfa-epbx (RETACRIT) injection  4,000 Units Intravenous Q M,W,F-HD   feeding supplement (NEPRO CARB STEADY)  237 mL Oral BID BM   irbesartan  300 mg Oral Daily   lactulose  20 g Oral BID   metoprolol tartrate  25 mg Oral BID   pantoprazole  40 mg Oral Daily   tamsulosin  0.4 mg Oral Daily   Continuous Infusions:  ceFEPime (MAXIPIME) IV 1 g (12/03/23 2156)   metronidazole 500 mg (12/03/23 2153)     Anti-infectives (From admission, onward)    Start     Dose/Rate Route Frequency Ordered Stop   12/01/23 1445  metroNIDAZOLE (FLAGYL) IVPB 500 mg        500 mg 100 mL/hr over 60 Minutes Intravenous Every 12 hours 12/01/23 1354     11/28/23 2200  ceFEPIme (MAXIPIME) 1 g in sodium chloride 0.9 % 100 mL IVPB        1 g 200 mL/hr over 30 Minutes Intravenous Every 24 hours 11/28/23 2120     11/27/23 1300  cefTRIAXone (ROCEPHIN) 2 g in sodium chloride 0.9 % 100 mL IVPB  Status:  Discontinued        2 g 200 mL/hr over 30 Minutes Intravenous Every 24 hours 11/26/23 2027 11/28/23 2120   11/27/23 1200  vancomycin (VANCOREADY) IVPB 1500 mg/300 mL  Status:  Discontinued        1,500 mg 150 mL/hr over 120 Minutes Intravenous  Once 11/27/23 1131 11/27/23 1223   11/26/23 1345  cefTRIAXone (ROCEPHIN) 2 g in sodium chloride 0.9 % 100 mL IVPB        2 g 200 mL/hr over 30 Minutes Intravenous Once 11/26/23 1333 11/26/23 1432   11/26/23 1345  azithromycin (ZITHROMAX) 500 mg in sodium chloride 0.9 % 250 mL IVPB        500 mg 250 mL/hr  over 60 Minutes Intravenous  Once 11/26/23 1333 11/26/23 1633              Family Communication/Anticipated D/C date and plan/Code Status   DVT prophylaxis: SCDs Start: 11/26/23 2029     Code Status: Full Code  Family Communication: None Disposition Plan:  SNF/rehab   Status is: Inpatient Remains inpatient appropriate because:  needs SNF placement - pending  Medically stable 12/05/23 for discharge.    Subjective:   Pt seen in dialysis, sleeping woke to voice.  He reports some discomfort at HD cath site but minimal.  No other complaints.  Asks for a warm blanket.   Objective:    Vitals:   12/04/23 1100 12/04/23 1130 12/04/23 1200 12/04/23 1230  BP: 124/79 122/79 119/75 120/79  Pulse: 76 77 75 77  Resp: 15 12 (!) 37 14  Temp:      TempSrc:      SpO2: 100% 100%  99% 100%  Weight:      Height:       No data found.   Intake/Output Summary (Last 24 hours) at 12/04/2023 1301 Last data filed at 12/03/2023 1436 Gross per 24 hour  Intake 360 ml  Output --  Net 360 ml   Filed Weights   12/02/23 0856 12/02/23 1300 12/04/23 0931  Weight: 58.5 kg 58.5 kg 57.1 kg    Exam:  General exam: awake, alert, no acute distress Respiratory system: normal respiratory effort.on room air, lungs clear Cardiovascular system: RRR, no edema, right upper chest perm cath in place Gastrointestinal system: soft, NT, non-distended Central nervous system: no gross focal neurologic deficits, A&O x3 Extremities: no edema, normal tone Psychiatry: normal mood, congruent affect    Data Reviewed:   I have personally reviewed following labs and imaging studies:  Labs: Labs show the following:   Basic Metabolic Panel: Recent Labs  Lab 11/28/23 0827 11/30/23 1407 12/02/23 0905 12/04/23 0547  NA 137 137 132* 132*  K 4.0 4.7 3.7 3.4*  CL 101 96* 97* 97*  CO2 20* 16* 18* 21*  GLUCOSE 66* 93 106* 79  BUN 69* 94* 72* 47*  CREATININE 7.24* 9.72* 7.29* 6.05*  CALCIUM 8.4* 8.6*  8.0* 7.6*  PHOS  --  5.8* 5.1* 5.1*   GFR Estimated Creatinine Clearance: 10.1 mL/min (A) (by C-G formula based on SCr of 6.05 mg/dL (H)). Liver Function Tests: Recent Labs  Lab 11/30/23 1407 12/02/23 0905 12/04/23 0547  ALBUMIN 2.4* 1.9* 1.8*   No results for input(s): "LIPASE", "AMYLASE" in the last 168 hours.  No results for input(s): "AMMONIA" in the last 168 hours.  Coagulation profile No results for input(s): "INR", "PROTIME" in the last 168 hours.   CBC: Recent Labs  Lab 11/28/23 0543 11/30/23 1407 12/02/23 0905 12/04/23 0547  WBC 13.7* 11.5* 8.9 7.4  HGB 9.0* 8.9* 8.2* 8.0*  HCT 25.4* 26.9* 24.1* 23.5*  MCV 87.0 91.2 90.3 87.7  PLT 205 190 140* 149*    CBG: Recent Labs  Lab 11/30/23 0738 12/01/23 0724 12/01/23 1602 12/03/23 0820 12/03/23 1223  GLUCAP 87 72 80 70 98     LOS: 8 days     Pennie Banter, DO  [facility]   Pager on [email or web address]. If 7PM-7AM, please contact night-coverage at [email or web address]     12/04/2023, 1:01 PM

## 2023-12-04 NOTE — Progress Notes (Signed)
Occupational Therapy Treatment Patient Details Name: Casey Franco MRN: [id number] DOB: 06/01/1961 Today's Date: 12/04/2023   History of present illness presented to ER secondary to abdominal pain, chest pain and coffee-ground emesis; admitted for management of spontaneous bacterial peritonitis.  s/p paracentesis with 1.6L removed on 2/13   OT comments  Pt received in bed, limited progress made towards goals due to lack of meaningful participation. With education on importance of repositioning and pressure relief, pt agreeable to roll R > L > back to supine for this writer to check for cleanliness. Pt declines repositioning in side lying. Pt presenting with difficulties problem-solving and reasoning - attempts to argue with therapist, rasing voice at times. MaxA to don socks bed level. Pt refused further ADLs and OOB mobility, continuing to perseverate on current level of limitations and dismisses any attempts at goal setting. Educated pt on importance of participation in OT sessions for progression of functional independence - pt has refused to meaningful partake in activities twice. OT will sign off if pt refuses next session, pt verbalizes understanding and thanks this Clinical research associate for her time. OT will attempt to progress pt next session, pt with guarded potential towards rehab goals at this time.       If plan is discharge home, recommend the following:  A little help with walking and/or transfers;A lot of help with bathing/dressing/bathroom;Assistance with cooking/housework;Direct supervision/assist for financial management;Supervision due to cognitive status;Direct supervision/assist for medications management;Help with stairs or ramp for entrance   Equipment Recommendations  Other (comment)    Recommendations for Other Services      Precautions / Restrictions Precautions Precautions: Fall Recall of Precautions/Restrictions: Impaired Restrictions Weight Bearing Restrictions Per Provider  Order: No       Mobility Bed Mobility Overal bed mobility: Needs Assistance Bed Mobility: Rolling Rolling: Supervision         General bed mobility comments: pt rolls L >R> back to supine with supervision    Transfers                   General transfer comment: Pt refuses     Balance                                           ADL either performed or assessed with clinical judgement   ADL Overall ADL's : Needs assistance/impaired                     Lower Body Dressing: Maximal assistance;Bed level Lower Body Dressing Details (indicate cue type and reason): Pt refuses to self-initiate donning socks, raises voice at therapist when OT attempts to facilitate independent participation. Pt raises foot for OT to place sock               General ADL Comments: Pt refuses to perform additional ADLs or OOB mobility.     Communication Communication Communication: No apparent difficulties   Cognition Arousal: Alert Behavior During Therapy: Agitated Cognition: Cognition impaired, Difficult to assess     Awareness: Intellectual awareness impaired Memory impairment (select all impairments): Short-term memory Attention impairment (select first level of impairment): Focused attention Executive functioning impairment (select all impairments): Initiation, Problem solving, Reasoning OT - Cognition Comments: Pt agitated quickly, raises voice and pushes tray table aggressively when OT attempts to facilitate increased problem-solving.  Following commands: Impaired Following commands impaired: Follows one step commands with increased time      Cueing   Cueing Techniques: Verbal cues, Tactile cues, Visual cues, Gestural cues             Pertinent Vitals/ Pain       Pain Assessment Pain Assessment: No/denies pain         Frequency  Min 1X/week        Progress Toward Goals  OT Goals(current goals can now be  found in the care plan section)  Progress towards OT goals: OT to reassess next treatment  Acute Rehab OT Goals OT Goal Formulation: With patient Time For Goal Achievement: 12/15/23 Potential to Achieve Goals: Fair ADL Goals Pt Will Perform Upper Body Bathing: with set-up;sitting Pt Will Perform Lower Body Bathing: with supervision;sit to/from stand;sitting/lateral leans;with set-up Pt Will Perform Lower Body Dressing: with supervision;with set-up;sit to/from stand;sitting/lateral leans Pt Will Transfer to Toilet: with supervision;ambulating;regular height toilet Pt Will Perform Toileting - Clothing Manipulation and hygiene: with modified independence;sitting/lateral leans;sit to/from stand  Plan         AM-PAC OT "6 Clicks" Daily Activity     Outcome Measure   Help from another person eating meals?: None Help from another person taking care of personal grooming?: None Help from another person toileting, which includes using toliet, bedpan, or urinal?: A Lot Help from another person bathing (including washing, rinsing, drying)?: A Lot Help from another person to put on and taking off regular upper body clothing?: A Little Help from another person to put on and taking off regular lower body clothing?: A Lot 6 Click Score: 17    End of Session    OT Visit Diagnosis: Other abnormalities of gait and mobility (R26.89);Unsteadiness on feet (R26.81);Muscle weakness (generalized) (M62.81)   Activity Tolerance Other (comment) (pt self-limits session by refusing to progress mobility or perform ADLs)   Patient Left in bed;with call bell/phone within reach;with bed alarm set   Nurse Communication Mobility status        Time: 1610-9604 OT Time Calculation (min): 16 min  Charges: OT General Charges $OT Visit: 1 Visit OT Treatments $Self Care/Home Management : 8-22 mins  Braxdon Gappa L. Dejuan Elman, OTR/L  12/04/23, 4:56 PM

## 2023-12-05 DIAGNOSIS — K652 Spontaneous bacterial peritonitis: Secondary | ICD-10-CM | POA: Diagnosis not present

## 2023-12-05 LAB — GLUCOSE, CAPILLARY
Glucose-Capillary: 81 mg/dL (ref 70–99)
Glucose-Capillary: 73 mg/dL (ref 70–99)

## 2023-12-05 MED ORDER — LEVOFLOXACIN 250 MG PO TABS
250.0000 mg | ORAL_TABLET | Freq: Every day | ORAL | Status: DC
  Administered 2023-12-05 – 2023-12-10 (×6): 250 mg via ORAL
  Filled 2023-12-05 (×7): qty 1

## 2023-12-05 MED ORDER — METRONIDAZOLE 500 MG PO TABS
500.0000 mg | ORAL_TABLET | Freq: Two times a day (BID) | ORAL | Status: AC
  Administered 2023-12-05 – 2023-12-14 (×19): 500 mg via ORAL
  Filled 2023-12-05 (×21): qty 1

## 2023-12-05 NOTE — Progress Notes (Signed)
 Physical Therapy Treatment Patient Details Name: Casey Franco MRN: [id number] DOB: Oct 19, 1960 Today's Date: 12/05/2023   History of Present Illness presented to ER secondary to abdominal pain, chest pain and coffee-ground emesis; admitted for management of spontaneous bacterial peritonitis.  s/p paracentesis with 1.6L removed on 2/13    PT Comments  Pt is alert, watching TV, upon arrival. He endorses having had BM in the bed. Author assisted with hygiene care prior to pt performing OOB activity. Pt has been resistant to OOB however was cooperative this session. He easily demonstrated abilities to roll in bed for hygiene care prior to sitting EOB with CGA.  Pt stood to RW and was able to ambulate along EOB and performing marching with CGA only. Pt unwilling to advance away from EOB but demonstrates the strength and safe functional mobility to easily ambulate. Overall pt tolerated session well. DC recs remain appropriate to maximize pt independence while assisting him to PLOF. Acute PT will continue to follow per current POC.    If plan is discharge home, recommend the following: A little help with walking and/or transfers;A little help with bathing/dressing/bathroom;Assistance with cooking/housework;Direct supervision/assist for medications management;Direct supervision/assist for financial management;Assist for transportation;Help with stairs or ramp for entrance     Equipment Recommendations  Rolling walker (2 wheels)       Precautions / Restrictions Precautions Precautions: Fall Recall of Precautions/Restrictions: Intact Restrictions Weight Bearing Restrictions Per Provider Order: No     Mobility  Bed Mobility Overal bed mobility: Needs Assistance Bed Mobility: Rolling, Supine to Sit, Sit to Supine Rolling: Supervision Supine to sit: Contact guard, Used rails Sit to supine: Contact guard assist    Transfers Overall transfer level: Needs assistance Equipment used: Rolling walker  (2 wheels) Transfers: Sit to/from Stand Sit to Stand: Min assist  General transfer comment: min assist to STS 3 x EOB from lowest bed height. vcs for technique improvements and fwd wt shift    Ambulation/Gait Ambulation/Gait assistance: Contact guard assist Gait Distance (Feet): 4 Feet Assistive device: Rolling walker (2 wheels) Gait Pattern/deviations: Step-through pattern Gait velocity: decreased  General Gait Details: pt was unwilling to ambulate away from EOB however easily able to sidestep FOB><HOB 4 times. Alternating marching in place 2 x 20. pt tolerated well but remains unwilkling to truely ambulate. Pt could easily ambulate if willing    Balance Overall balance assessment: Needs assistance Sitting-balance support: Feet supported, No upper extremity supported Sitting balance-Leahy Scale: Good Sitting balance - Comments: no LOB while seated EOB > 5 minutes during session   Standing balance support: Bilateral upper extremity supported, During functional activity, Reliant on assistive device for balance Standing balance-Leahy Scale: Fair Standing balance comment: no LOB observed with BUE support on AD       Communication Communication Communication: No apparent difficulties  Cognition Arousal: Alert Behavior During Therapy: WFL for tasks assessed/performed   PT - Cognitive impairments: No apparent impairments    PT - Cognition Comments: pt is A and O x 4 today. agreeable to session with time and encouragement." Your the first person I can get alone with." Following commands: Intact      Cueing Cueing Techniques: Verbal cues         Pertinent Vitals/Pain Pain Assessment Pain Assessment: No/denies pain     PT Goals (current goals can now be found in the care plan section) Acute Rehab PT Goals Patient Stated Goal: rehab then home Progress towards PT goals: Progressing toward goals    Frequency  Min 1X/week       AM-PAC PT "6 Clicks" Mobility    Outcome Measure  Help needed turning from your back to your side while in a flat bed without using bedrails?: None Help needed moving from lying on your back to sitting on the side of a flat bed without using bedrails?: None Help needed moving to and from a bed to a chair (including a wheelchair)?: A Little Help needed standing up from a chair using your arms (e.g., wheelchair or bedside chair)?: A Little Help needed to walk in hospital room?: A Little Help needed climbing 3-5 steps with a railing? : A Little 6 Click Score: 20    End of Session   Activity Tolerance: Patient tolerated treatment well;Patient limited by fatigue Patient left: in bed;with call bell/phone within reach;with bed alarm set (" Its saturday, I'm not gonna sit in the recliner. I'm gonna rest in bed today.") Nurse Communication: Mobility status PT Visit Diagnosis: Difficulty in walking, not elsewhere classified (R26.2);Muscle weakness (generalized) (M62.81)     Time: 0347-4259 PT Time Calculation (min) (ACUTE ONLY): 29 min  Charges:    $Therapeutic Activity: 23-37 mins PT General Charges $$ ACUTE PT VISIT: 1 Visit                     Jetta Lout PTA 12/05/23, 9:23 AM

## 2023-12-05 NOTE — Progress Notes (Signed)
 [facility]  ROUNDING NOTE   Subjective:   Casey Franco is a 63 y.o. male with past medical history of HTN, ESRD, DM-2, and Hep C. Patient presents to emergency department with abdominal pain. He has been admitted for Intractable vomiting [R11.10] Abdominal pain [R10.9] Pneumonia of both lungs due to infectious organism, unspecified part of lung [J18.9] Sepsis without acute organ dysfunction, due to unspecified organism [facility]) [A41.9]  Patient is known to our practice and receives outpatient dialysis at [facility] on a MWF, last treatment on Tuesday.    Update Patient resting quietly in bed Appetite remains poor Abd tenderness remains but improving Room air   Objective:  Vital signs in last 24 hours:  Temp:  [97.3 F (36.3 C)-98.6 F (37 C)] 97.5 F (36.4 C) (02/22 0750) Pulse Rate:  [75-78] 75 (02/22 0750) Resp:  [12-37] 18 (02/22 0750) BP: (97-124)/(70-91) 106/84 (02/22 0750) SpO2:  [98 %-100 %] 98 % (02/22 0750) Weight:  [57.1 kg] 57.1 kg (02/21 1331)  Weight change:  Filed Weights   12/02/23 1300 12/04/23 0931 12/04/23 1331  Weight: 58.5 kg 57.1 kg 57.1 kg    Intake/Output: No intake/output data recorded.   Intake/Output this shift:  No intake/output data recorded.  Physical Exam: General: NAD  Head: Normocephalic, atraumatic. Moist oral mucosal membranes  Eyes: Anicteric  Lungs:  Clear to auscultation, normal effort  Heart: Regular rate and rhythm, aflutter  Abdomen:  Soft, tender, mild distension  Extremities: No peripheral edema.  Neurologic: Alert and oriented, moving all four extremities  Skin: No lesions  Access: Right chest tunneled catheter    Basic Metabolic Panel: Recent Labs  Lab 11/30/23 1407 12/02/23 0905 12/04/23 0547  NA 137 132* 132*  K 4.7 3.7 3.4*  CL 96* 97* 97*  CO2 16* 18* 21*  GLUCOSE 93 106* 79  BUN 94* 72* 47*  CREATININE 9.72* 7.29* 6.05*  CALCIUM 8.6* 8.0* 7.6*  PHOS 5.8* 5.1* 5.1*    Liver  Function Tests: Recent Labs  Lab 11/30/23 1407 12/02/23 0905 12/04/23 0547  ALBUMIN 2.4* 1.9* 1.8*   No results for input(s): "LIPASE", "AMYLASE" in the last 168 hours.  No results for input(s): "AMMONIA" in the last 168 hours.   CBC: Recent Labs  Lab 11/30/23 1407 12/02/23 0905 12/04/23 0547  WBC 11.5* 8.9 7.4  HGB 8.9* 8.2* 8.0*  HCT 26.9* 24.1* 23.5*  MCV 91.2 90.3 87.7  PLT 190 140* 149*    Cardiac Enzymes: No results for input(s): "CKTOTAL", "CKMB", "CKMBINDEX", "TROPONINI" in the last 168 hours.  BNP: Invalid input(s): "POCBNP"  CBG: Recent Labs  Lab 12/03/23 0820 12/03/23 1223 12/04/23 1433 12/05/23 0547 12/05/23 0746  GLUCAP 70 98 69* 81 73    Microbiology: Results for orders placed or performed during the hospital encounter of 11/26/23  Culture, blood (Routine x 2)     Status: Abnormal   Collection Time: 11/26/23 11:22 AM   Specimen: BLOOD  Result Value Ref Range Status   Specimen Description   Final    BLOOD RIGHT ANTECUBITAL Performed at [facility], [address] [place], [place] [address]    Special Requests   Final    BOTTLES DRAWN AEROBIC AND ANAEROBIC Blood Culture results may not be optimal due to an inadequate volume of blood received in culture bottles Performed at [facility], [address], [place], [place] [address]    Culture  Setup Time   Final    ANAEROBIC BOTTLE ONLY  GRAM NEGATIVE RODS CRITICAL RESULT CALLED TO, READ BACK BY AND VERIFIED WITH: CAROLINE COULTER @ 11/28/23 2033 AB    Culture (A)  Final    BACTEROIDES THETAIOTAOMICRON BETA LACTAMASE POSITIVE Performed at [facility], [address], [place], [place] [address]    Report Status 12/01/2023 FINAL  Final  Blood Culture ID Panel (Reflexed)     Status: None   Collection Time: 11/26/23 11:22 AM  Result Value Ref Range Status   Enterococcus faecalis NOT DETECTED NOT DETECTED Final   Enterococcus Faecium NOT DETECTED NOT DETECTED  Final   Listeria monocytogenes NOT DETECTED NOT DETECTED Final   Staphylococcus species NOT DETECTED NOT DETECTED Final   Staphylococcus aureus (BCID) NOT DETECTED NOT DETECTED Final   Staphylococcus epidermidis NOT DETECTED NOT DETECTED Final   Staphylococcus lugdunensis NOT DETECTED NOT DETECTED Final   Streptococcus species NOT DETECTED NOT DETECTED Final   Streptococcus agalactiae NOT DETECTED NOT DETECTED Final   Streptococcus pneumoniae NOT DETECTED NOT DETECTED Final   Streptococcus pyogenes NOT DETECTED NOT DETECTED Final   A.calcoaceticus-baumannii NOT DETECTED NOT DETECTED Final   Bacteroides fragilis NOT DETECTED NOT DETECTED Final   Enterobacterales NOT DETECTED NOT DETECTED Final   Enterobacter cloacae complex NOT DETECTED NOT DETECTED Final   Escherichia coli NOT DETECTED NOT DETECTED Final   Klebsiella aerogenes NOT DETECTED NOT DETECTED Final   Klebsiella oxytoca NOT DETECTED NOT DETECTED Final   Klebsiella pneumoniae NOT DETECTED NOT DETECTED Final   Proteus species NOT DETECTED NOT DETECTED Final   Salmonella species NOT DETECTED NOT DETECTED Final   Serratia marcescens NOT DETECTED NOT DETECTED Final   Haemophilus influenzae NOT DETECTED NOT DETECTED Final   Neisseria meningitidis NOT DETECTED NOT DETECTED Final   Pseudomonas aeruginosa NOT DETECTED NOT DETECTED Final   Stenotrophomonas maltophilia NOT DETECTED NOT DETECTED Final   Candida albicans NOT DETECTED NOT DETECTED Final   Candida auris NOT DETECTED NOT DETECTED Final   Candida glabrata NOT DETECTED NOT DETECTED Final   Candida krusei NOT DETECTED NOT DETECTED Final   Candida parapsilosis NOT DETECTED NOT DETECTED Final   Candida tropicalis NOT DETECTED NOT DETECTED Final   Cryptococcus neoformans/gattii NOT DETECTED NOT DETECTED Final    Comment: Performed at [facility], [address] [place], [place] [address]  Culture, blood (Routine x 2)     Status: Abnormal   Collection Time: 11/26/23  12:23 PM   Specimen: BLOOD LEFT ARM  Result Value Ref Range Status   Specimen Description   Final    BLOOD LEFT ARM Performed at [facility], [address] [place], [place] [address]    Special Requests   Final    BOTTLES DRAWN AEROBIC AND ANAEROBIC Blood Culture results may not be optimal due to an inadequate volume of blood received in culture bottles Performed at [facility], [address] [place], [place] [address]    Culture  Setup Time   Final    GRAM POSITIVE COCCI AEROBIC BOTTLE ONLY CRITICAL RESULT CALLED TO, READ BACK BY AND VERIFIED WITH: BRIANA ALLEY ON 11/27/23 AT 1107 QSD    Culture (A)  Final    STAPHYLOCOCCUS EPIDERMIDIS THE SIGNIFICANCE OF ISOLATING THIS ORGANISM FROM A SINGLE SET OF BLOOD CULTURES WHEN MULTIPLE SETS ARE DRAWN IS UNCERTAIN. PLEASE NOTIFY THE MICROBIOLOGY DEPARTMENT WITHIN ONE WEEK IF SPECIATION AND SENSITIVITIES ARE REQUIRED. Performed at [facility], [address], [place], [place] [address]    Report Status 11/29/2023  FINAL  Final  Blood Culture ID Panel (Reflexed)     Status: Abnormal   Collection Time: 11/26/23 12:23 PM  Result Value Ref Range Status   Enterococcus faecalis NOT DETECTED NOT DETECTED Final   Enterococcus Faecium NOT DETECTED NOT DETECTED Final   Listeria monocytogenes NOT DETECTED NOT DETECTED Final   Staphylococcus species DETECTED (A) NOT DETECTED Final    Comment: CRITICAL RESULT CALLED TO, READ BACK BY AND VERIFIED WITH: BRIANA ALLEY ON 11/27/23 AT 1107 QSD    Staphylococcus aureus (BCID) NOT DETECTED NOT DETECTED Final   Staphylococcus epidermidis DETECTED (A) NOT DETECTED Final    Comment: CRITICAL RESULT CALLED TO, READ BACK BY AND VERIFIED WITH: BRIANA ALLEY ON 11/27/23 AT 1107 QSD    Staphylococcus lugdunensis NOT DETECTED NOT DETECTED Final   Streptococcus species NOT DETECTED NOT DETECTED Final   Streptococcus agalactiae NOT DETECTED NOT DETECTED Final   Streptococcus pneumoniae NOT  DETECTED NOT DETECTED Final   Streptococcus pyogenes NOT DETECTED NOT DETECTED Final   A.calcoaceticus-baumannii NOT DETECTED NOT DETECTED Final   Bacteroides fragilis NOT DETECTED NOT DETECTED Final   Enterobacterales NOT DETECTED NOT DETECTED Final   Enterobacter cloacae complex NOT DETECTED NOT DETECTED Final   Escherichia coli NOT DETECTED NOT DETECTED Final   Klebsiella aerogenes NOT DETECTED NOT DETECTED Final   Klebsiella oxytoca NOT DETECTED NOT DETECTED Final   Klebsiella pneumoniae NOT DETECTED NOT DETECTED Final   Proteus species NOT DETECTED NOT DETECTED Final   Salmonella species NOT DETECTED NOT DETECTED Final   Serratia marcescens NOT DETECTED NOT DETECTED Final   Haemophilus influenzae NOT DETECTED NOT DETECTED Final   Neisseria meningitidis NOT DETECTED NOT DETECTED Final   Pseudomonas aeruginosa NOT DETECTED NOT DETECTED Final   Stenotrophomonas maltophilia NOT DETECTED NOT DETECTED Final   Candida albicans NOT DETECTED NOT DETECTED Final   Candida auris NOT DETECTED NOT DETECTED Final   Candida glabrata NOT DETECTED NOT DETECTED Final   Candida krusei NOT DETECTED NOT DETECTED Final   Candida parapsilosis NOT DETECTED NOT DETECTED Final   Candida tropicalis NOT DETECTED NOT DETECTED Final   Cryptococcus neoformans/gattii NOT DETECTED NOT DETECTED Final   Methicillin resistance mecA/C NOT DETECTED NOT DETECTED Final    Comment: Performed at [facility], [address] [place], [place] [address]  Body fluid culture w Gram Stain     Status: None   Collection Time: 11/26/23  8:55 PM   Specimen: PATH Cytology Peritoneal fluid  Result Value Ref Range Status   Specimen Description   Final    PERITONEAL CYTO Performed at [facility], [address] [place], [place] [address]    Special Requests   Final    NONE Performed at [facility], [address] [place], [place] [address]    Gram Stain   Final    FEW WBC PRESENT,BOTH PMN AND  MONONUCLEAR FEW GRAM POSITIVE COCCI IN CHAINS MODERATE GRAM NEGATIVE RODS FEW GRAM POSITIVE RODS Performed at [facility], [address] [place], [place] [address]    Culture   Final    MODERATE STREPTOCOCCUS MITIS/ORALIS RARE CITROBACTER AMALONATICUS    Report Status 11/30/2023 FINAL  Final   Organism ID, Bacteria STREPTOCOCCUS MITIS/ORALIS  Final   Organism ID, Bacteria CITROBACTER AMALONATICUS  Final      Susceptibility   Citrobacter amalonaticus - MIC*    CEFEPIME <=0.12 SENSITIVE Sensitive     CEFTAZIDIME <=1 SENSITIVE Sensitive     CEFTRIAXONE <=0.25 SENSITIVE  Sensitive     CIPROFLOXACIN <=0.25 SENSITIVE Sensitive     GENTAMICIN <=1 SENSITIVE Sensitive     IMIPENEM <=0.25 SENSITIVE Sensitive     TRIMETH/SULFA <=20 SENSITIVE Sensitive     PIP/TAZO <=4 SENSITIVE Sensitive ug/mL    * RARE CITROBACTER AMALONATICUS   Streptococcus mitis/oralis - MIC*    PENICILLIN 0.25 INTERMEDIATE Intermediate     CEFTRIAXONE 0.25 SENSITIVE Sensitive     LEVOFLOXACIN 1 SENSITIVE Sensitive     VANCOMYCIN 0.5 SENSITIVE Sensitive     * MODERATE STREPTOCOCCUS MITIS/ORALIS  Culture, blood (Routine X 2) w Reflex to ID Panel     Status: None   Collection Time: 11/28/23  5:43 AM   Specimen: BLOOD LEFT ARM  Result Value Ref Range Status   Specimen Description BLOOD LEFT ARM  Final   Special Requests   Final    BOTTLES DRAWN AEROBIC AND ANAEROBIC Blood Culture results may not be optimal due to an inadequate volume of blood received in culture bottles   Culture   Final    NO GROWTH 5 DAYS Performed at [facility], [address] [place], [place] [address]    Report Status 12/03/2023 FINAL  Final  Culture, blood (Routine X 2) w Reflex to ID Panel     Status: None   Collection Time: 11/28/23  5:47 AM   Specimen: BLOOD RIGHT HAND  Result Value Ref Range Status   Specimen Description BLOOD RIGHT HAND  Final   Special Requests   Final    BOTTLES DRAWN AEROBIC AND ANAEROBIC Blood  Culture results may not be optimal due to an inadequate volume of blood received in culture bottles   Culture   Final    NO GROWTH 5 DAYS Performed at [facility], [address], [place], [place] [address]    Report Status 12/03/2023 FINAL  Final  Body fluid culture w Gram Stain     Status: None (Preliminary result)   Collection Time: 12/01/23 10:10 AM   Specimen: Ascitic; Body Fluid  Result Value Ref Range Status   Specimen Description   Final    ASCITIC Performed at [facility], [address] [place], [place] [address]    Special Requests   Final    peri Performed at [facility], [address] [place], [place] [address]    Gram Stain   Final    FEW WBC PRESENT, PREDOMINANTLY PMN ABUNDANT GRAM NEGATIVE RODS ABUNDANT GRAM POSITIVE COCCI RARE GRAM POSITIVE RODS    Culture   Final    FEW STREPTOCOCCUS MITIS/ORALIS RARE CITROBACTER AMALONATICUS SUSCEPTIBILITIES TO FOLLOW MODERATE BACTEROIDES THETAIOTAOMICRON BETA LACTAMASE POSITIVE RARE EIKENELLA CORRODENS Usually susceptible to penicillin and other beta lactam agents,quinolones,macrolides and tetracyclines. Performed at [facility], [address], [place], [place] [address]    Report Status PENDING  Incomplete    Coagulation Studies: No results for input(s): "LABPROT", "INR" in the last 72 hours.   Urinalysis: No results for input(s): "COLORURINE", "LABSPEC", "PHURINE", "GLUCOSEU", "HGBUR", "BILIRUBINUR", "KETONESUR", "PROTEINUR", "UROBILINOGEN", "NITRITE", "LEUKOCYTESUR" in the last 72 hours.  Invalid input(s): "APPERANCEUR"    Imaging: No results found.     Medications:      amLODipine  10 mg Oral Daily   Chlorhexidine Gluconate Cloth  6 each Topical Q0600   epoetin alfa-epbx (RETACRIT) injection  4,000 Units Intravenous Q M,W,F-HD   feeding supplement (NEPRO CARB STEADY)  237 mL Oral BID BM   irbesartan  300 mg Oral Daily  lactulose  20 g Oral BID    levofloxacin  250 mg Oral Daily   metoprolol tartrate  25 mg Oral BID   metroNIDAZOLE  500 mg Oral Q12H   pantoprazole  40 mg Oral Daily   tamsulosin  0.4 mg Oral Daily   acetaminophen, alum & mag hydroxide-simeth, antiseptic oral rinse, hydrALAZINE, hydrOXYzine, ondansetron (ZOFRAN) IV  Assessment/ Plan:  Mr. Casey Franco is a 63 y.o.  male with HTN, ESRD, DM-2, Hep C.  Patient presents with chest pain and has been admitted for Intractable vomiting [R11.10] Abdominal pain [R10.9] Pneumonia of both lungs due to infectious organism, unspecified part of lung [J18.9] Sepsis without acute organ dysfunction, due to unspecified organism [facility]) [A41.9]  [facility]/MWF/right chest PermCath/71.0 kg  End-stage renal disease on hemodialysis.  Next treatment scheduled for Monday  2. Anemia of chronic kidney disease Lab Results  Component Value Date   HGB 8.0 (L) 12/04/2023    Patient does receive Mircera at outpatient clinic.  EPO with this dialysis.Will monitor for need to increase ESA.  3. Secondary Hyperparathyroidism: with outpatient labs: PTH 351, phosphorus 6.6, calcium 8.1 on 11/23/2023.   Lab Results  Component Value Date   PTH 31 08/30/2018   CALCIUM 7.6 (L) 12/04/2023   PHOS 5.1 (H) 12/04/2023    S calcium decreased. Corrected calcium 9.4. Will continue to monitor.   4.  Hypertension with chronic kidney disease.  Home regimen includes furosemide, hydralazine, amlodipine, and irbesartan. Blood pressure stable for this patient.  5.  Cirrhosis/ascites, spontaneous bacterial peritonitis. Paracentesis from 11/27/2023 yielded 1.6 L of fluid.  Complex loculations suspected as more ascites present. Microbiology results show Staph epidermidis in the blood. Peritoneal fluid culture from 11/26/2023 shows Streptococcus and Citrobacter. Blood cultures negative, transitioned to oral antibiotics, levofloxacin and  metronidazole. Lactulose twice a day. Paracentesis on  12/02/23 with 4..6L removed. Fluid cultures negative   LOS: 9 Gregg Winchell 2/22/202510:45 AM

## 2023-12-05 NOTE — Plan of Care (Signed)

## 2023-12-05 NOTE — Progress Notes (Addendum)
 Progress Note    Casey Franco  ZOX:[id number] DOB: 06-Aug-1961  DOA: 11/26/2023 PCP: [facility]      Brief Narrative:    Medical records reviewed and are as summarized below:  Casey Franco is a 63 y.o. 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  He also complained of nausea, increasing abdominal distention and multiple episodes of vomiting.      Assessment/Plan:   Principal Problem:   Spontaneous bacterial peritonitis (HCC) Active Problems:   Abdominal pain   Cirrhosis of liver with ascites (HCC)   Myocardial injury   Essential hypertension   ESRD (end stage renal disease) (HCC)   Coffee ground emesis   Paroxysmal atrial fibrillation (HCC)   Sepsis without acute organ dysfunction (HCC)    Body mass index is 17.07 kg/m.   Spontaneous bacterial peritonitis Abdominal pain Liver cirrhosis with ascites: S/p paracentesis with removal of 1600 mL of fluid on 11/26/2023.   Fluid positive for SBP (ascitic fluid total nucleated cells 1,326, percentage neutrophils was 68% and absolute PMN count 901) Ascitic fluid culture showed Streptococcus mitis and Citrobacter amalonaticus S/p repeat paracentesis on 12/01/2023 with removal of 4.6 L of fluid. Percentage neutrophil count was 100 and total nucleated cell count 4,687. He was evaluated by the general surgeon.  No indication for surgery at this time.   --Continue IV Cefepime, Flagyl --Transition to PO Levaquin and Flagyl today to complete course   Staph epidermidis bacteremia: 1 out of 4 bottles from 11/26/2023 positive for Staph epidermidis.  This is likely a contaminant.  Repeat blood cultures from 11/27/2023 has not shown any growth thus far.  Bacteroides  thetaiotaomicron bacteremia (blood cultures from 11/26/2023  growing gram-negative rods).   --Continue IV cefepime (day 6) --IV Flagyl added, continue  (day 3) --Dr. Myriam Forehand, prior attending had discussed with [name], ID pharmacist   Hematemesis: Resolved.  This is probably from multiple episodes of vomiting.   H&H is stable.   Elevated troponins: Troponin is 172, 138, 127 and 128.  No chest pain or acute EKG changes. Due to demand ischemia.   Paroxysmal atrial fibrillation, PVCs: Heart rate is better.  Continue metoprolol.  We discussed risks, benefits and alternatives to long-term anticoagulation for atrial fibrillation. He is at increased risk for bleeding from long-term anticoagulation because of underlying severe liver and kidney disease. CHA2DS2-VASc score is 3 and he is also at increased risk for stroke. He wants to consider Eliquis for stroke prophylaxis. --Resume Eliquis when closer to discharge and clear no additional procedures needed   Chronic systolic CHF Moderate mitral regurgitation 2D echo showed EF estimated at 40 to 45%, moderate LVH, indeterminate left ventricular diastolic parameters, mildly reduced RV systolic function, moderately enlarged RV size, moderately dilated right atrium, small pericardial effusion, moderate mitral regurgitation --Volume management by dialysis  Confusion on 11/30/2023, likely acute toxic encephalopathy from oxycodone: Mental status is better today.  Oxycodone has been discontinued. --Delirium precautions   ESRD on hemodialysis MWF --Nephrology following for hemodialysis   Dysphagia: Speech therapist recommended dysphagia 2 diet, thin liquids.   General weakness: PT recommended discharge to SNF.   Follow-up with TOC to assist with placement.   Comorbidities include hypertension, hyperlipidemia, BPH, type II DM   Goals of care Prognosis is guarded due to multiple chronic co-morbidities. Palliative care  team consulted for goals of care discussions, now following peripherally as goals are  established and remain full code / full scope.    Diet Order             DIET DYS 2 Fluid consistency: Thin  Diet effective now                   Consultants: General surgery Interventional radiology  Procedures: Paracentesis with removal of 1,600 mL of fluid on 11/26/2023 Paracentesis with removal of 4.6 L of fluid on 12/01/2023    Medications:    amLODipine  10 mg Oral Daily   Chlorhexidine Gluconate Cloth  6 each Topical Q0600   epoetin alfa-epbx (RETACRIT) injection  4,000 Units Intravenous Q M,W,F-HD   feeding supplement (NEPRO CARB STEADY)  237 mL Oral BID BM   irbesartan  300 mg Oral Daily   lactulose  20 g Oral BID   levofloxacin  250 mg Oral Daily   metoprolol tartrate  25 mg Oral BID   metroNIDAZOLE  500 mg Oral Q12H   pantoprazole  40 mg Oral Daily   tamsulosin  0.4 mg Oral Daily   Continuous Infusions:     Anti-infectives (From admission, onward)    Start     Dose/Rate Route Frequency Ordered Stop   12/05/23 1000  metroNIDAZOLE (FLAGYL) tablet 500 mg        500 mg Oral Every 12 hours 12/05/23 0807     12/05/23 1000  levofloxacin (LEVAQUIN) tablet 250 mg        250 mg Oral Daily 12/05/23 0807     12/01/23 1445  metroNIDAZOLE (FLAGYL) IVPB 500 mg  Status:  Discontinued        500 mg 100 mL/hr over 60 Minutes Intravenous Every 12 hours 12/01/23 1354 12/05/23 0806   11/28/23 2200  ceFEPIme (MAXIPIME) 1 g in sodium chloride 0.9 % 100 mL IVPB  Status:  Discontinued        1 g 200 mL/hr over 30 Minutes Intravenous Every 24 hours 11/28/23 2120 12/05/23 0806   11/27/23 1300  cefTRIAXone (ROCEPHIN) 2 g in sodium chloride 0.9 % 100 mL IVPB  Status:  Discontinued        2 g 200 mL/hr over 30 Minutes Intravenous Every 24 hours 11/26/23 2027 11/28/23 2120   11/27/23 1200  vancomycin (VANCOREADY) IVPB 1500 mg/300 mL  Status:  Discontinued        1,500 mg 150 mL/hr over 120 Minutes Intravenous  Once 11/27/23 1131 11/27/23 1223   11/26/23 1345  cefTRIAXone  (ROCEPHIN) 2 g in sodium chloride 0.9 % 100 mL IVPB        2 g 200 mL/hr over 30 Minutes Intravenous Once 11/26/23 1333 11/26/23 1432   11/26/23 1345  azithromycin (ZITHROMAX) 500 mg in sodium chloride 0.9 % 250 mL IVPB        500 mg 250 mL/hr over 60 Minutes Intravenous  Once 11/26/23 1333 11/26/23 1633              Family Communication/Anticipated D/C date and plan/Code Status   DVT prophylaxis: SCDs Start: 11/26/23 2029     Code Status: Full Code  Family Communication: None Disposition Plan:  SNF/rehab   Status is: Inpatient Remains inpatient appropriate because:  needs SNF placement - pending  Medically stable 12/05/23 for discharge.    Subjective:   Pt seen awake resting in bed this AM. Pt states he felt more motivated to work with therapy this  AM, and was able to stand and take some side steps.  He denies acute complaints.    Objective:    Vitals:   12/05/23 0122 12/05/23 0426 12/05/23 0750 12/05/23 1308  BP: 106/72 103/71 106/84 100/70  Pulse: 78 76 75 79  Resp: 18 18 18    Temp: 98.6 F (37 C) 98.4 F (36.9 C) (!) 97.5 F (36.4 C) 98 F (36.7 C)  TempSrc: Oral Oral  Axillary  SpO2: 99% 99% 98% 96%  Weight:      Height:       No data found.  No intake or output data in the 24 hours ending 12/05/23 1526  Filed Weights   12/02/23 1300 12/04/23 0931 12/04/23 1331  Weight: 58.5 kg 57.1 kg 57.1 kg    Exam:  General exam: awake, alert, no acute distress Respiratory system: normal respiratory effort.on room air, lungs clear Cardiovascular system: RRR, no edema, right upper chest perm cath in place Gastrointestinal system: soft, NT, non-distended Central nervous system: no gross focal neurologic deficits, A&O x3 Extremities: no edema, normal tone Psychiatry: normal mood, congruent affect    Data Reviewed:   I have personally reviewed following labs and imaging studies:  Labs: Labs show the following:   Basic Metabolic Panel: Recent  Labs  Lab 11/30/23 1407 12/02/23 0905 12/04/23 0547  NA 137 132* 132*  K 4.7 3.7 3.4*  CL 96* 97* 97*  CO2 16* 18* 21*  GLUCOSE 93 106* 79  BUN 94* 72* 47*  CREATININE 9.72* 7.29* 6.05*  CALCIUM 8.6* 8.0* 7.6*  PHOS 5.8* 5.1* 5.1*   GFR Estimated Creatinine Clearance: 10.1 mL/min (A) (by C-G formula based on SCr of 6.05 mg/dL (H)). Liver Function Tests: Recent Labs  Lab 11/30/23 1407 12/02/23 0905 12/04/23 0547  ALBUMIN 2.4* 1.9* 1.8*   No results for input(s): "LIPASE", "AMYLASE" in the last 168 hours.  No results for input(s): "AMMONIA" in the last 168 hours.  Coagulation profile No results for input(s): "INR", "PROTIME" in the last 168 hours.   CBC: Recent Labs  Lab 11/30/23 1407 12/02/23 0905 12/04/23 0547  WBC 11.5* 8.9 7.4  HGB 8.9* 8.2* 8.0*  HCT 26.9* 24.1* 23.5*  MCV 91.2 90.3 87.7  PLT 190 140* 149*    CBG: Recent Labs  Lab 12/03/23 0820 12/03/23 1223 12/04/23 1433 12/05/23 0547 12/05/23 0746  GLUCAP 70 98 69* 81 73     LOS: 9 days     Pennie Banter, DO  [facility]   Pager on [email or web address]. If 7PM-7AM, please contact night-coverage at [email or web address]     12/05/2023, 3:26 PM

## 2023-12-06 DIAGNOSIS — K652 Spontaneous bacterial peritonitis: Secondary | ICD-10-CM | POA: Diagnosis not present

## 2023-12-06 LAB — GLUCOSE, CAPILLARY: Glucose-Capillary: 88 mg/dL (ref 70–99)

## 2023-12-06 LAB — BODY FLUID CULTURE W GRAM STAIN

## 2023-12-06 NOTE — Progress Notes (Signed)
 Progress Note    Casey Franco  ZOX:[id number] DOB: 17-Sep-1961  DOA: 11/26/2023 PCP: [facility]      Brief Narrative:    Medical records reviewed and are as summarized below:  Casey Franco is a 63 y.o. 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  He also complained of nausea, increasing abdominal distention and multiple episodes of vomiting.      Assessment/Plan:   Principal Problem:   Spontaneous bacterial peritonitis (HCC) Active Problems:   Abdominal pain   Cirrhosis of liver with ascites (HCC)   Myocardial injury   Essential hypertension   ESRD (end stage renal disease) (HCC)   Coffee ground emesis   Paroxysmal atrial fibrillation (HCC)   Sepsis without acute organ dysfunction (HCC)    Body mass index is 17.07 kg/m.   Spontaneous bacterial peritonitis Abdominal pain Liver cirrhosis with ascites: S/p paracentesis with removal of 1600 mL of fluid on 11/26/2023.   Fluid positive for SBP (ascitic fluid total nucleated cells 1,326, percentage neutrophils was 68% and absolute PMN count 901) Ascitic fluid culture showed Streptococcus mitis and Citrobacter amalonaticus S/p repeat paracentesis on 12/01/2023 with removal of 4.6 L of fluid. Percentage neutrophil count was 100 and total nucleated cell count 4,687. He was evaluated by the general surgeon.  No indication for surgery at this time.   --Continue IV Cefepime, Flagyl --Transition to PO Levaquin and Flagyl today to complete course   Staph epidermidis bacteremia: 1 out of 4 bottles from 11/26/2023 positive for Staph epidermidis.  This is likely a contaminant.  Repeat blood cultures from 11/27/2023 has not shown any growth thus far.  Bacteroides  thetaiotaomicron bacteremia (blood cultures from 11/26/2023  growing gram-negative rods).   --Continue IV cefepime (day 6) --IV Flagyl added, continue  (day 3) --Dr. Myriam Forehand, prior attending had discussed with [name], ID pharmacist   Hematemesis: Resolved.  This is probably from multiple episodes of vomiting.   H&H is stable.   Elevated troponins: Troponin is 172, 138, 127 and 128.  No chest pain or acute EKG changes. Due to demand ischemia.   Paroxysmal atrial fibrillation, PVCs: Heart rate is better.  Continue metoprolol.  We discussed risks, benefits and alternatives to long-term anticoagulation for atrial fibrillation. He is at increased risk for bleeding from long-term anticoagulation because of underlying severe liver and kidney disease. CHA2DS2-VASc score is 3 and he is also at increased risk for stroke. He wants to consider Eliquis for stroke prophylaxis. --Resume Eliquis when closer to discharge and clear no additional procedures needed   Chronic systolic CHF Moderate mitral regurgitation 2D echo showed EF estimated at 40 to 45%, moderate LVH, indeterminate left ventricular diastolic parameters, mildly reduced RV systolic function, moderately enlarged RV size, moderately dilated right atrium, small pericardial effusion, moderate mitral regurgitation --Volume management by dialysis  Confusion on 11/30/2023, likely acute toxic encephalopathy from oxycodone: Mental status is better today.  Oxycodone has been discontinued. --Delirium precautions   ESRD on hemodialysis MWF --Nephrology following for hemodialysis   Dysphagia: Speech therapist recommended dysphagia 2 diet, thin liquids.   General weakness: PT recommended discharge to SNF.   Follow-up with TOC to assist with placement.   Comorbidities include hypertension, hyperlipidemia, BPH, type II DM   Goals of care Prognosis is guarded due to multiple chronic co-morbidities. Palliative care  team consulted for goals of care discussions, now following peripherally as goals are  established and remain full code / full scope.    Diet Order             DIET DYS 2 Fluid consistency: Thin  Diet effective now                   Consultants: General surgery Interventional radiology  Procedures: Paracentesis with removal of 1,600 mL of fluid on 11/26/2023 Paracentesis with removal of 4.6 L of fluid on 12/01/2023    Medications:    amLODipine  10 mg Oral Daily   Chlorhexidine Gluconate Cloth  6 each Topical Q0600   epoetin alfa-epbx (RETACRIT) injection  4,000 Units Intravenous Q M,W,F-HD   feeding supplement (NEPRO CARB STEADY)  237 mL Oral BID BM   irbesartan  300 mg Oral Daily   lactulose  20 g Oral BID   levofloxacin  250 mg Oral Daily   metoprolol tartrate  25 mg Oral BID   metroNIDAZOLE  500 mg Oral Q12H   pantoprazole  40 mg Oral Daily   tamsulosin  0.4 mg Oral Daily   Continuous Infusions:     Anti-infectives (From admission, onward)    Start     Dose/Rate Route Frequency Ordered Stop   12/05/23 1000  metroNIDAZOLE (FLAGYL) tablet 500 mg        500 mg Oral Every 12 hours 12/05/23 0807     12/05/23 1000  levofloxacin (LEVAQUIN) tablet 250 mg        250 mg Oral Daily 12/05/23 0807     12/01/23 1445  metroNIDAZOLE (FLAGYL) IVPB 500 mg  Status:  Discontinued        500 mg 100 mL/hr over 60 Minutes Intravenous Every 12 hours 12/01/23 1354 12/05/23 0806   11/28/23 2200  ceFEPIme (MAXIPIME) 1 g in sodium chloride 0.9 % 100 mL IVPB  Status:  Discontinued        1 g 200 mL/hr over 30 Minutes Intravenous Every 24 hours 11/28/23 2120 12/05/23 0806   11/27/23 1300  cefTRIAXone (ROCEPHIN) 2 g in sodium chloride 0.9 % 100 mL IVPB  Status:  Discontinued        2 g 200 mL/hr over 30 Minutes Intravenous Every 24 hours 11/26/23 2027 11/28/23 2120   11/27/23 1200  vancomycin (VANCOREADY) IVPB 1500 mg/300 mL  Status:  Discontinued        1,500 mg 150 mL/hr over 120 Minutes Intravenous  Once 11/27/23 1131 11/27/23 1223   11/26/23 1345  cefTRIAXone  (ROCEPHIN) 2 g in sodium chloride 0.9 % 100 mL IVPB        2 g 200 mL/hr over 30 Minutes Intravenous Once 11/26/23 1333 11/26/23 1432   11/26/23 1345  azithromycin (ZITHROMAX) 500 mg in sodium chloride 0.9 % 250 mL IVPB        500 mg 250 mL/hr over 60 Minutes Intravenous  Once 11/26/23 1333 11/26/23 1633              Family Communication/Anticipated D/C date and plan/Code Status   DVT prophylaxis: SCDs Start: 11/26/23 2029     Code Status: Full Code  Family Communication: None Disposition Plan:  SNF/rehab   Status is: Inpatient Remains inpatient appropriate because:  needs SNF placement - pending  Medically stable 12/05/23 for discharge.    Subjective:   Pt sleeping this AM, wakes briefly.  No acute complaints.   Objective:    Vitals:  12/05/23 2007 12/06/23 0323 12/06/23 0749 12/06/23 1233  BP: 101/73 98/74 99/74  115/74  Pulse: 77 74 77 78  Resp: 18 18 18 18   Temp: 98 F (36.7 C) 98.3 F (36.8 C) 98.7 F (37.1 C) 98.3 F (36.8 C)  TempSrc: Oral Oral Oral Oral  SpO2: 100% 99% 99% 100%  Weight:      Height:       No data found.   Intake/Output Summary (Last 24 hours) at 12/06/2023 1400 Last data filed at 12/06/2023 1040 Gross per 24 hour  Intake 240 ml  Output --  Net 240 ml    Filed Weights   12/02/23 1300 12/04/23 0931 12/04/23 1331  Weight: 58.5 kg 57.1 kg 57.1 kg    Exam:  General exam: sleeping comfortably, wakes easily, no acute distress Respiratory system: normal respiratory effort.on room air, lungs clear Cardiovascular system: RRR, no edema, right upper chest perm cath in place Gastrointestinal system: soft, NT, non-distended Central nervous system: no gross focal neurologic deficits, somnolent Extremities: no edema, normal tone Psychiatry: normal mood, congruent affect    Data Reviewed:   I have personally reviewed following labs and imaging studies:  Labs: Labs show the following:   Basic Metabolic Panel: Recent Labs   Lab 11/30/23 1407 12/02/23 0905 12/04/23 0547  NA 137 132* 132*  K 4.7 3.7 3.4*  CL 96* 97* 97*  CO2 16* 18* 21*  GLUCOSE 93 106* 79  BUN 94* 72* 47*  CREATININE 9.72* 7.29* 6.05*  CALCIUM 8.6* 8.0* 7.6*  PHOS 5.8* 5.1* 5.1*   GFR Estimated Creatinine Clearance: 10.1 mL/min (A) (by C-G formula based on SCr of 6.05 mg/dL (H)). Liver Function Tests: Recent Labs  Lab 11/30/23 1407 12/02/23 0905 12/04/23 0547  ALBUMIN 2.4* 1.9* 1.8*   No results for input(s): "LIPASE", "AMYLASE" in the last 168 hours.  No results for input(s): "AMMONIA" in the last 168 hours.  Coagulation profile No results for input(s): "INR", "PROTIME" in the last 168 hours.   CBC: Recent Labs  Lab 11/30/23 1407 12/02/23 0905 12/04/23 0547  WBC 11.5* 8.9 7.4  HGB 8.9* 8.2* 8.0*  HCT 26.9* 24.1* 23.5*  MCV 91.2 90.3 87.7  PLT 190 140* 149*    CBG: Recent Labs  Lab 12/03/23 1223 12/04/23 1433 12/05/23 0547 12/05/23 0746 12/06/23 0753  GLUCAP 98 69* 81 73 88     LOS: 10 days     Pennie Banter, DO  [facility]   Pager on [email or web address]. If 7PM-7AM, please contact night-coverage at [email or web address]     12/06/2023, 2:00 PM

## 2023-12-06 NOTE — Progress Notes (Signed)
 [facility]  ROUNDING NOTE   Subjective:   Casey Franco is a 63 y.o. male with past medical history of HTN, ESRD, DM-2, and Hep C. Patient presents to emergency department with abdominal pain. He has been admitted for Intractable vomiting [R11.10] Abdominal pain [R10.9] Pneumonia of both lungs due to infectious organism, unspecified part of lung [J18.9] Sepsis without acute organ dysfunction, due to unspecified organism [facility]) [A41.9]  Patient is known to our practice and receives outpatient dialysis at [facility] on a MWF, last treatment on Tuesday.    Update Resting comfortably in bed Alert and oriented Abdominal soreness Room air  Breakfast tray set up   Objective:  Vital signs in last 24 hours:  Temp:  [98 F (36.7 C)-98.7 F (37.1 C)] 98.7 F (37.1 C) (02/23 0749) Pulse Rate:  [74-79] 77 (02/23 0749) Resp:  [18-20] 18 (02/23 0749) BP: (98-101)/(70-74) 99/74 (02/23 0749) SpO2:  [96 %-100 %] 99 % (02/23 0749)  Weight change:  Filed Weights   12/02/23 1300 12/04/23 0931 12/04/23 1331  Weight: 58.5 kg 57.1 kg 57.1 kg    Intake/Output: I/O last 3 completed shifts: In: 240 [P.O.:240] Out: -    Intake/Output this shift:  No intake/output data recorded.  Physical Exam: General: NAD  Head: Normocephalic, atraumatic. Moist oral mucosal membranes  Eyes: Anicteric  Lungs:  Clear to auscultation, normal effort  Heart: Regular rate and rhythm, aflutter  Abdomen:  Soft, tender, mild distension  Extremities: No peripheral edema.  Neurologic: Alert and oriented, moving all four extremities  Skin: No lesions  Access: Right chest tunneled catheter    Basic Metabolic Panel: Recent Labs  Lab 11/30/23 1407 12/02/23 0905 12/04/23 0547  NA 137 132* 132*  K 4.7 3.7 3.4*  CL 96* 97* 97*  CO2 16* 18* 21*  GLUCOSE 93 106* 79  BUN 94* 72* 47*  CREATININE 9.72* 7.29* 6.05*  CALCIUM 8.6* 8.0* 7.6*  PHOS 5.8* 5.1* 5.1*    Liver Function  Tests: Recent Labs  Lab 11/30/23 1407 12/02/23 0905 12/04/23 0547  ALBUMIN 2.4* 1.9* 1.8*   No results for input(s): "LIPASE", "AMYLASE" in the last 168 hours.  No results for input(s): "AMMONIA" in the last 168 hours.   CBC: Recent Labs  Lab 11/30/23 1407 12/02/23 0905 12/04/23 0547  WBC 11.5* 8.9 7.4  HGB 8.9* 8.2* 8.0*  HCT 26.9* 24.1* 23.5*  MCV 91.2 90.3 87.7  PLT 190 140* 149*    Cardiac Enzymes: No results for input(s): "CKTOTAL", "CKMB", "CKMBINDEX", "TROPONINI" in the last 168 hours.  BNP: Invalid input(s): "POCBNP"  CBG: Recent Labs  Lab 12/03/23 1223 12/04/23 1433 12/05/23 0547 12/05/23 0746 12/06/23 0753  GLUCAP 98 69* 81 73 88    Microbiology: Results for orders placed or performed during the hospital encounter of 11/26/23  Culture, blood (Routine x 2)     Status: Abnormal   Collection Time: 11/26/23 11:22 AM   Specimen: BLOOD  Result Value Ref Range Status   Specimen Description   Final    BLOOD RIGHT ANTECUBITAL Performed at [facility], [address] [place], [place] [address]    Special Requests   Final    BOTTLES DRAWN AEROBIC AND ANAEROBIC Blood Culture results may not be optimal due to an inadequate volume of blood received in culture bottles Performed at [facility], [address], [place], [place] [address]    Culture  Setup Time   Final    ANAEROBIC BOTTLE ONLY  GRAM NEGATIVE RODS CRITICAL RESULT CALLED TO, READ BACK BY AND VERIFIED WITH: CAROLINE COULTER @ 11/28/23 2033 AB    Culture (A)  Final    BACTEROIDES THETAIOTAOMICRON BETA LACTAMASE POSITIVE Performed at [facility], [address] [place], [place] [address]    Report Status 12/01/2023 FINAL  Final  Blood Culture ID Panel (Reflexed)     Status: None   Collection Time: 11/26/23 11:22 AM  Result Value Ref Range Status   Enterococcus faecalis NOT DETECTED NOT DETECTED Final   Enterococcus Faecium NOT DETECTED NOT DETECTED Final    Listeria monocytogenes NOT DETECTED NOT DETECTED Final   Staphylococcus species NOT DETECTED NOT DETECTED Final   Staphylococcus aureus (BCID) NOT DETECTED NOT DETECTED Final   Staphylococcus epidermidis NOT DETECTED NOT DETECTED Final   Staphylococcus lugdunensis NOT DETECTED NOT DETECTED Final   Streptococcus species NOT DETECTED NOT DETECTED Final   Streptococcus agalactiae NOT DETECTED NOT DETECTED Final   Streptococcus pneumoniae NOT DETECTED NOT DETECTED Final   Streptococcus pyogenes NOT DETECTED NOT DETECTED Final   A.calcoaceticus-baumannii NOT DETECTED NOT DETECTED Final   Bacteroides fragilis NOT DETECTED NOT DETECTED Final   Enterobacterales NOT DETECTED NOT DETECTED Final   Enterobacter cloacae complex NOT DETECTED NOT DETECTED Final   Escherichia coli NOT DETECTED NOT DETECTED Final   Klebsiella aerogenes NOT DETECTED NOT DETECTED Final   Klebsiella oxytoca NOT DETECTED NOT DETECTED Final   Klebsiella pneumoniae NOT DETECTED NOT DETECTED Final   Proteus species NOT DETECTED NOT DETECTED Final   Salmonella species NOT DETECTED NOT DETECTED Final   Serratia marcescens NOT DETECTED NOT DETECTED Final   Haemophilus influenzae NOT DETECTED NOT DETECTED Final   Neisseria meningitidis NOT DETECTED NOT DETECTED Final   Pseudomonas aeruginosa NOT DETECTED NOT DETECTED Final   Stenotrophomonas maltophilia NOT DETECTED NOT DETECTED Final   Candida albicans NOT DETECTED NOT DETECTED Final   Candida auris NOT DETECTED NOT DETECTED Final   Candida glabrata NOT DETECTED NOT DETECTED Final   Candida krusei NOT DETECTED NOT DETECTED Final   Candida parapsilosis NOT DETECTED NOT DETECTED Final   Candida tropicalis NOT DETECTED NOT DETECTED Final   Cryptococcus neoformans/gattii NOT DETECTED NOT DETECTED Final    Comment: Performed at [facility], [address] [place], [place] [address]  Culture, blood (Routine x 2)     Status: Abnormal   Collection Time: 11/26/23 12:23 PM    Specimen: BLOOD LEFT ARM  Result Value Ref Range Status   Specimen Description   Final    BLOOD LEFT ARM Performed at [facility], [address], [place], [place] [address]    Special Requests   Final    BOTTLES DRAWN AEROBIC AND ANAEROBIC Blood Culture results may not be optimal due to an inadequate volume of blood received in culture bottles Performed at [facility], [address] [place], [place] [address]    Culture  Setup Time   Final    GRAM POSITIVE COCCI AEROBIC BOTTLE ONLY CRITICAL RESULT CALLED TO, READ BACK BY AND VERIFIED WITH: BRIANA ALLEY ON 11/27/23 AT 1107 QSD    Culture (A)  Final    STAPHYLOCOCCUS EPIDERMIDIS THE SIGNIFICANCE OF ISOLATING THIS ORGANISM FROM A SINGLE SET OF BLOOD CULTURES WHEN MULTIPLE SETS ARE DRAWN IS UNCERTAIN. PLEASE NOTIFY THE MICROBIOLOGY DEPARTMENT WITHIN ONE WEEK IF SPECIATION AND SENSITIVITIES ARE REQUIRED. Performed at [facility], [address] [place], [place] [address]    Report Status 11/29/2023  FINAL  Final  Blood Culture ID Panel (Reflexed)     Status: Abnormal   Collection Time: 11/26/23 12:23 PM  Result Value Ref Range Status   Enterococcus faecalis NOT DETECTED NOT DETECTED Final   Enterococcus Faecium NOT DETECTED NOT DETECTED Final   Listeria monocytogenes NOT DETECTED NOT DETECTED Final   Staphylococcus species DETECTED (A) NOT DETECTED Final    Comment: CRITICAL RESULT CALLED TO, READ BACK BY AND VERIFIED WITH: BRIANA ALLEY ON 11/27/23 AT 1107 QSD    Staphylococcus aureus (BCID) NOT DETECTED NOT DETECTED Final   Staphylococcus epidermidis DETECTED (A) NOT DETECTED Final    Comment: CRITICAL RESULT CALLED TO, READ BACK BY AND VERIFIED WITH: BRIANA ALLEY ON 11/27/23 AT 1107 QSD    Staphylococcus lugdunensis NOT DETECTED NOT DETECTED Final   Streptococcus species NOT DETECTED NOT DETECTED Final   Streptococcus agalactiae NOT DETECTED NOT DETECTED Final   Streptococcus pneumoniae NOT DETECTED NOT  DETECTED Final   Streptococcus pyogenes NOT DETECTED NOT DETECTED Final   A.calcoaceticus-baumannii NOT DETECTED NOT DETECTED Final   Bacteroides fragilis NOT DETECTED NOT DETECTED Final   Enterobacterales NOT DETECTED NOT DETECTED Final   Enterobacter cloacae complex NOT DETECTED NOT DETECTED Final   Escherichia coli NOT DETECTED NOT DETECTED Final   Klebsiella aerogenes NOT DETECTED NOT DETECTED Final   Klebsiella oxytoca NOT DETECTED NOT DETECTED Final   Klebsiella pneumoniae NOT DETECTED NOT DETECTED Final   Proteus species NOT DETECTED NOT DETECTED Final   Salmonella species NOT DETECTED NOT DETECTED Final   Serratia marcescens NOT DETECTED NOT DETECTED Final   Haemophilus influenzae NOT DETECTED NOT DETECTED Final   Neisseria meningitidis NOT DETECTED NOT DETECTED Final   Pseudomonas aeruginosa NOT DETECTED NOT DETECTED Final   Stenotrophomonas maltophilia NOT DETECTED NOT DETECTED Final   Candida albicans NOT DETECTED NOT DETECTED Final   Candida auris NOT DETECTED NOT DETECTED Final   Candida glabrata NOT DETECTED NOT DETECTED Final   Candida krusei NOT DETECTED NOT DETECTED Final   Candida parapsilosis NOT DETECTED NOT DETECTED Final   Candida tropicalis NOT DETECTED NOT DETECTED Final   Cryptococcus neoformans/gattii NOT DETECTED NOT DETECTED Final   Methicillin resistance mecA/C NOT DETECTED NOT DETECTED Final    Comment: Performed at [facility], [address] [place], [place] [address]  Body fluid culture w Gram Stain     Status: None   Collection Time: 11/26/23  8:55 PM   Specimen: PATH Cytology Peritoneal fluid  Result Value Ref Range Status   Specimen Description   Final    PERITONEAL CYTO Performed at [facility] [address] [place], [place] [address]    Special Requests   Final    NONE Performed at [facility], [address] [place], [place] [address]    Gram Stain   Final    FEW WBC PRESENT,BOTH PMN AND  MONONUCLEAR FEW GRAM POSITIVE COCCI IN CHAINS MODERATE GRAM NEGATIVE RODS FEW GRAM POSITIVE RODS Performed at [facility], [address] [place], [place] [address]    Culture   Final    MODERATE STREPTOCOCCUS MITIS/ORALIS RARE CITROBACTER AMALONATICUS    Report Status 11/30/2023 FINAL  Final   Organism ID, Bacteria STREPTOCOCCUS MITIS/ORALIS  Final   Organism ID, Bacteria CITROBACTER AMALONATICUS  Final      Susceptibility   Citrobacter amalonaticus - MIC*    CEFEPIME <=0.12 SENSITIVE Sensitive     CEFTAZIDIME <=1 SENSITIVE Sensitive     CEFTRIAXONE <=0.25 SENSITIVE  Sensitive     CIPROFLOXACIN <=0.25 SENSITIVE Sensitive     GENTAMICIN <=1 SENSITIVE Sensitive     IMIPENEM <=0.25 SENSITIVE Sensitive     TRIMETH/SULFA <=20 SENSITIVE Sensitive     PIP/TAZO <=4 SENSITIVE Sensitive ug/mL    * RARE CITROBACTER AMALONATICUS   Streptococcus mitis/oralis - MIC*    PENICILLIN 0.25 INTERMEDIATE Intermediate     CEFTRIAXONE 0.25 SENSITIVE Sensitive     LEVOFLOXACIN 1 SENSITIVE Sensitive     VANCOMYCIN 0.5 SENSITIVE Sensitive     * MODERATE STREPTOCOCCUS MITIS/ORALIS  Culture, blood (Routine X 2) w Reflex to ID Panel     Status: None   Collection Time: 11/28/23  5:43 AM   Specimen: BLOOD LEFT ARM  Result Value Ref Range Status   Specimen Description BLOOD LEFT ARM  Final   Special Requests   Final    BOTTLES DRAWN AEROBIC AND ANAEROBIC Blood Culture results may not be optimal due to an inadequate volume of blood received in culture bottles   Culture   Final    NO GROWTH 5 DAYS Performed at [facility], [address] [place], [place] [address]    Report Status 12/03/2023 FINAL  Final  Culture, blood (Routine X 2) w Reflex to ID Panel     Status: None   Collection Time: 11/28/23  5:47 AM   Specimen: BLOOD RIGHT HAND  Result Value Ref Range Status   Specimen Description BLOOD RIGHT HAND  Final   Special Requests   Final    BOTTLES DRAWN AEROBIC AND ANAEROBIC Blood  Culture results may not be optimal due to an inadequate volume of blood received in culture bottles   Culture   Final    NO GROWTH 5 DAYS Performed at [facility], [address] [place], [place] [address]    Report Status 12/03/2023 FINAL  Final  Body fluid culture w Gram Stain     Status: None (Preliminary result)   Collection Time: 12/01/23 10:10 AM   Specimen: Ascitic; Body Fluid  Result Value Ref Range Status   Specimen Description   Final    ASCITIC Performed at [facility], [address] [place], [place] [address]    Special Requests   Final    peri Performed at [facility], [address] [place], [place] [address]    Gram Stain   Final    FEW WBC PRESENT, PREDOMINANTLY PMN ABUNDANT GRAM NEGATIVE RODS ABUNDANT GRAM POSITIVE COCCI RARE GRAM POSITIVE RODS    Culture   Final    FEW STREPTOCOCCUS MITIS/ORALIS RARE CITROBACTER AMALONATICUS CONFIRMATION OF SUSCEPTIBILITIES IN PROGRESS MODERATE BACTEROIDES THETAIOTAOMICRON BETA LACTAMASE POSITIVE RARE EIKENELLA CORRODENS Usually susceptible to penicillin and other beta lactam agents,quinolones,macrolides and tetracyclines. Performed at [facility], [address], [place], [place] [address]    Report Status PENDING  Incomplete   Organism ID, Bacteria STREPTOCOCCUS MITIS/ORALIS  Final      Susceptibility   Streptococcus mitis/oralis - MIC*    PENICILLIN <=0.06 SENSITIVE Sensitive     CEFTRIAXONE 0.25 SENSITIVE Sensitive     LEVOFLOXACIN 1 SENSITIVE Sensitive     VANCOMYCIN 0.5 SENSITIVE Sensitive     * FEW STREPTOCOCCUS MITIS/ORALIS    Coagulation Studies: No results for input(s): "LABPROT", "INR" in the last 72 hours.   Urinalysis: No results for input(s): "COLORURINE", "LABSPEC", "PHURINE", "GLUCOSEU", "HGBUR", "BILIRUBINUR", "KETONESUR", "PROTEINUR", "UROBILINOGEN", "NITRITE", "LEUKOCYTESUR" in the last 72 hours.  Invalid input(s): "APPERANCEUR"    Imaging: No  results found.     Medications:      amLODipine  10 mg Oral Daily   Chlorhexidine Gluconate Cloth  6 each Topical Q0600   epoetin alfa-epbx (RETACRIT) injection  4,000 Units Intravenous Q M,W,F-HD   feeding supplement (NEPRO CARB STEADY)  237 mL Oral BID BM   irbesartan  300 mg Oral Daily   lactulose  20 g Oral BID   levofloxacin  250 mg Oral Daily   metoprolol tartrate  25 mg Oral BID   metroNIDAZOLE  500 mg Oral Q12H   pantoprazole  40 mg Oral Daily   tamsulosin  0.4 mg Oral Daily   acetaminophen, alum & mag hydroxide-simeth, antiseptic oral rinse, hydrALAZINE, hydrOXYzine, ondansetron (ZOFRAN) IV  Assessment/ Plan:  Mr. CASSIUS CULLINANE is a 63 y.o.  male with HTN, ESRD, DM-2, Hep C.  Patient presents with chest pain and has been admitted for Intractable vomiting [R11.10] Abdominal pain [R10.9] Pneumonia of both lungs due to infectious organism, unspecified part of lung [J18.9] Sepsis without acute organ dysfunction, due to unspecified organism [facility]) [A41.9]  [facility]/MWF/right chest PermCath/71.0 kg  End-stage renal disease on hemodialysis.  Next treatment scheduled for Monday, no UF  2. Anemia of chronic kidney disease Lab Results  Component Value Date   HGB 8.0 (L) 12/04/2023    Patient does receive Mircera at outpatient clinic.  Continue EPO with this dialysis.  3. Secondary Hyperparathyroidism: with outpatient labs: PTH 351, phosphorus 6.6, calcium 8.1 on 11/23/2023.   Lab Results  Component Value Date   PTH 31 08/30/2018   CALCIUM 7.6 (L) 12/04/2023   PHOS 5.1 (H) 12/04/2023    Corrected calcium 9.4. Will continue to monitor.   4.  Hypertension with chronic kidney disease.  Home regimen includes furosemide, hydralazine, amlodipine, and irbesartan. Blood pressure soft but stable  5.  Cirrhosis/ascites, spontaneous bacterial peritonitis. Paracentesis from 11/27/2023 yielded 1.6 L of fluid.  Complex loculations suspected as more ascites  present. Microbiology results show Staph epidermidis in the blood. Peritoneal fluid culture from 11/26/2023 shows Streptococcus and Citrobacter. Blood cultures negative, transitioned to oral antibiotics, levofloxacin and  metronidazole. Lactulose twice a day. Paracentesis on 12/02/23 with 4..6L removed. Fluid cultures negative   LOS: 10 Keenen Roessner 2/23/202510:56 AM

## 2023-12-06 NOTE — Plan of Care (Signed)

## 2023-12-06 NOTE — Plan of Care (Signed)

## 2023-12-07 ENCOUNTER — Inpatient Hospital Stay: Payer: 59

## 2023-12-07 DIAGNOSIS — K652 Spontaneous bacterial peritonitis: Secondary | ICD-10-CM | POA: Diagnosis not present

## 2023-12-07 LAB — RENAL FUNCTION PANEL
Albumin: 1.8 g/dL — ABNORMAL LOW (ref 3.5–5.0)
Anion gap: 12 (ref 5–15)
BUN: 38 mg/dL — ABNORMAL HIGH (ref 8–23)
CO2: 21 mmol/L — ABNORMAL LOW (ref 22–32)
Calcium: 7.8 mg/dL — ABNORMAL LOW (ref 8.9–10.3)
Chloride: 101 mmol/L (ref 98–111)
Creatinine, Ser: 6.89 mg/dL — ABNORMAL HIGH (ref 0.61–1.24)
GFR, Estimated: 8 mL/min — ABNORMAL LOW (ref >60)
Glucose, Bld: 96 mg/dL (ref 70–99)
Phosphorus: 4.3 mg/dL (ref 2.5–4.6)
Potassium: 3.4 mmol/L — ABNORMAL LOW (ref 3.5–5.1)
Sodium: 134 mmol/L — ABNORMAL LOW (ref 135–145)

## 2023-12-07 LAB — CBC
HCT: 23.8 % — ABNORMAL LOW (ref 39.0–52.0)
Hemoglobin: 7.7 g/dL — ABNORMAL LOW (ref 13.0–17.0)
MCH: 30 pg (ref 26.0–34.0)
MCHC: 32.4 g/dL (ref 30.0–36.0)
MCV: 92.6 fL (ref 80.0–100.0)
Platelets: 136 10*3/uL — ABNORMAL LOW (ref 150–400)
RBC: 2.57 MIL/uL — ABNORMAL LOW (ref 4.22–5.81)
RDW: 16.4 % — ABNORMAL HIGH (ref 11.5–15.5)
WBC: 5.7 10*3/uL (ref 4.0–10.5)
nRBC: 0 % (ref 0.0–0.2)

## 2023-12-07 LAB — HEPATITIS B SURFACE ANTIGEN: Hepatitis B Surface Ag: NONREACTIVE

## 2023-12-07 MED ORDER — EPOETIN ALFA-EPBX 10000 UNIT/ML IJ SOLN
INTRAMUSCULAR | Status: AC
  Filled 2023-12-07: qty 1

## 2023-12-07 MED ORDER — ALTEPLASE 2 MG IJ SOLR: 2.0000 mg | Freq: Once | INTRAMUSCULAR | Status: DC | PRN

## 2023-12-07 MED ORDER — FLEET ENEMA RE ENEM: 1.0000 | ENEMA | Freq: Once | RECTAL | Status: DC

## 2023-12-07 MED ORDER — HEPARIN SODIUM (PORCINE) 1000 UNIT/ML DIALYSIS: 1000.0000 [IU] | INTRAMUSCULAR | Status: DC | PRN

## 2023-12-07 MED ORDER — EPOETIN ALFA-EPBX 10000 UNIT/ML IJ SOLN
10000.0000 [IU] | INTRAMUSCULAR | Status: DC
  Administered 2023-12-07 – 2023-12-23 (×8): 10000 [IU] via INTRAVENOUS
  Filled 2023-12-07 (×7): qty 1

## 2023-12-07 NOTE — Progress Notes (Signed)
 Hemodialysis note  Received patient in bed to unit. Alert and oriented.  Informed consent signed and in chart.  Treatment initiated: 0813 Treatment completed: 1120  Patient tolerated well. Transported back to room, alert without acute distress.  Report given to patient's RN.   Access used: Right Chest HD Catheter Access issues: none  Total UF removed: 0 Medication(s) given:  Retacrit 10 000 units IV  Post HD weight: 57.9 kg   Wolfgang Phoenix [facility]

## 2023-12-07 NOTE — Progress Notes (Signed)
 Progress Note    Casey Franco  ZOX:[id number] DOB: [birth year]  DOA: 11/26/2023 PCP: [facility]      Brief Narrative:    Medical records reviewed and are as summarized below:  Casey Franco is a 63 y.o. 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  He also complained of nausea, increasing abdominal distention and multiple episodes of vomiting.      Assessment/Plan:   Principal Problem:   Spontaneous bacterial peritonitis (HCC) Active Problems:   Abdominal pain   Cirrhosis of liver with ascites (HCC)   Myocardial injury   Essential hypertension   ESRD (end stage renal disease) (HCC)   Coffee ground emesis   Paroxysmal atrial fibrillation (HCC)   Sepsis without acute organ dysfunction (HCC)    Body mass index is 17.31 kg/m.   Spontaneous bacterial peritonitis Abdominal pain Liver cirrhosis with ascites: S/p paracentesis with removal of 1600 mL of fluid on 11/26/2023.   Fluid positive for SBP (ascitic fluid total nucleated cells 1,326, percentage neutrophils was 68% and absolute PMN count 901) Ascitic fluid culture showed Streptococcus mitis and Citrobacter amalonaticus S/p repeat paracentesis on 12/01/2023 with removal of 4.6 L of fluid. Percentage neutrophil count was 100 and total nucleated cell count 4,687. He was evaluated by the general surgeon.  No indication for surgery at this time.   --Continue IV Cefepime, Flagyl --Transition to PO Levaquin and Flagyl today to complete course   Staph epidermidis bacteremia: 1 out of 4 bottles from 11/26/2023 positive for Staph epidermidis.  This is likely a contaminant.  Repeat blood cultures from 11/27/2023 has not shown any growth thus far.  Bacteroides  thetaiotaomicron bacteremia (blood cultures from 11/26/2023  growing gram-negative rods).   --Continue IV cefepime (day 6) --IV Flagyl added, continue  (day 3) --Dr. Myriam Forehand, prior attending had discussed with [name], ID pharmacist   Hematemesis: Resolved.  This is probably from multiple episodes of vomiting.   H&H is stable.   Elevated troponins: Troponin is 172, 138, 127 and 128.  No chest pain or acute EKG changes. Due to demand ischemia.   Paroxysmal atrial fibrillation, PVCs: Heart rate is better.  Continue metoprolol.  We discussed risks, benefits and alternatives to long-term anticoagulation for atrial fibrillation. He is at increased risk for bleeding from long-term anticoagulation because of underlying severe liver and kidney disease. CHA2DS2-VASc score is 3 and he is also at increased risk for stroke. He wants to consider Eliquis for stroke prophylaxis. --Resume Eliquis when closer to discharge and clear no additional procedures needed   Chronic systolic CHF Moderate mitral regurgitation 2D echo showed EF estimated at 40 to 45%, moderate LVH, indeterminate left ventricular diastolic parameters, mildly reduced RV systolic function, moderately enlarged RV size, moderately dilated right atrium, small pericardial effusion, moderate mitral regurgitation --Volume management by dialysis  Confusion on 11/30/2023, likely acute toxic encephalopathy from oxycodone: Mental status is better today.  Oxycodone has been discontinued. --Delirium precautions   ESRD on hemodialysis MWF --Nephrology following for hemodialysis   Dysphagia: Speech therapist recommended dysphagia 2 diet, thin liquids.   General weakness: PT recommended discharge to SNF.   Follow-up with TOC to assist with placement.   Comorbidities include hypertension, hyperlipidemia, BPH, type II DM   Goals of care Prognosis is guarded due to multiple chronic co-morbidities. Palliative care  team consulted for goals of care discussions, now following peripherally as goals are  established and remain full code / full scope.    Diet Order             DIET DYS 2 Fluid consistency: Thin  Diet effective now                   Consultants: General surgery Interventional radiology  Procedures: Paracentesis with removal of 1,600 mL of fluid on 11/26/2023 Paracentesis with removal of 4.6 L of fluid on 12/01/2023    Medications:    amLODipine  10 mg Oral Daily   Chlorhexidine Gluconate Cloth  6 each Topical Q0600   epoetin alfa-epbx (RETACRIT) injection  10,000 Units Intravenous Q M,W,F-HD   feeding supplement (NEPRO CARB STEADY)  237 mL Oral BID BM   irbesartan  300 mg Oral Daily   lactulose  20 g Oral BID   levofloxacin  250 mg Oral Daily   metoprolol tartrate  25 mg Oral BID   metroNIDAZOLE  500 mg Oral Q12H   pantoprazole  40 mg Oral Daily   tamsulosin  0.4 mg Oral Daily   Continuous Infusions:     Anti-infectives (From admission, onward)    Start     Dose/Rate Route Frequency Ordered Stop   12/05/23 1000  metroNIDAZOLE (FLAGYL) tablet 500 mg        500 mg Oral Every 12 hours 12/05/23 0807     12/05/23 1000  levofloxacin (LEVAQUIN) tablet 250 mg        250 mg Oral Daily 12/05/23 0807     12/01/23 1445  metroNIDAZOLE (FLAGYL) IVPB 500 mg  Status:  Discontinued        500 mg 100 mL/hr over 60 Minutes Intravenous Every 12 hours 12/01/23 1354 12/05/23 0806   11/28/23 2200  ceFEPIme (MAXIPIME) 1 g in sodium chloride 0.9 % 100 mL IVPB  Status:  Discontinued        1 g 200 mL/hr over 30 Minutes Intravenous Every 24 hours 11/28/23 2120 12/05/23 0806   11/27/23 1300  cefTRIAXone (ROCEPHIN) 2 g in sodium chloride 0.9 % 100 mL IVPB  Status:  Discontinued        2 g 200 mL/hr over 30 Minutes Intravenous Every 24 hours 11/26/23 2027 11/28/23 2120   11/27/23 1200  vancomycin (VANCOREADY) IVPB 1500 mg/300 mL  Status:  Discontinued        1,500 mg 150 mL/hr over 120 Minutes Intravenous  Once 11/27/23 1131 11/27/23 1223   11/26/23 1345  cefTRIAXone  (ROCEPHIN) 2 g in sodium chloride 0.9 % 100 mL IVPB        2 g 200 mL/hr over 30 Minutes Intravenous Once 11/26/23 1333 11/26/23 1432   11/26/23 1345  azithromycin (ZITHROMAX) 500 mg in sodium chloride 0.9 % 250 mL IVPB        500 mg 250 mL/hr over 60 Minutes Intravenous  Once 11/26/23 1333 11/26/23 1633              Family Communication/Anticipated D/C date and plan/Code Status   DVT prophylaxis: SCDs Start: 11/26/23 2029     Code Status: Full Code  Family Communication: None Disposition Plan:  SNF/rehab   Status is: Inpatient Remains inpatient appropriate because:  needs SNF placement - pending  Medically stable 12/05/23 for discharge.    Subjective:   Pt seen in dialysis. He reports mildly upset stomach but not nausea/vomiting and non-tender exam.  No other complaints.  Eager to go to rehab.   Objective:    Vitals:   12/07/23 1030 12/07/23 1100 12/07/23 1120 12/07/23 1130  BP: 115/76 111/80 116/80 112/76  Pulse: 77 77 77 77  Resp: 15 15 18 17   Temp:      TempSrc:   Oral   SpO2: 97% 97% 95% 96%  Weight:   57.9 kg   Height:       No data found.   Intake/Output Summary (Last 24 hours) at 12/07/2023 1221 Last data filed at 12/07/2023 1120 Gross per 24 hour  Intake 120 ml  Output 0 ml  Net 120 ml    Filed Weights   12/04/23 1331 12/07/23 0746 12/07/23 1120  Weight: 57.1 kg 57.9 kg 57.9 kg    Exam:  General exam: awake & alert, wakes easily, no acute distress Respiratory system: normal respiratory effort.on room air, lungs clear Cardiovascular system: RRR, no edema, right upper chest perm cath in place Gastrointestinal system: soft, NT, non-distended Central nervous system: no gross focal neurologic deficits, somnolent Extremities: no edema, normal tone Psychiatry: normal mood, congruent affect    Data Reviewed:   I have personally reviewed following labs and imaging studies:  Labs: Labs show the following:   Basic Metabolic  Panel: Recent Labs  Lab 11/30/23 1407 12/02/23 0905 12/04/23 0547 12/07/23 0608  NA 137 132* 132* 134*  K 4.7 3.7 3.4* 3.4*  CL 96* 97* 97* 101  CO2 16* 18* 21* 21*  GLUCOSE 93 106* 79 96  BUN 94* 72* 47* 38*  CREATININE 9.72* 7.29* 6.05* 6.89*  CALCIUM 8.6* 8.0* 7.6* 7.8*  PHOS 5.8* 5.1* 5.1* 4.3   GFR Estimated Creatinine Clearance: 9 mL/min (A) (by C-G formula based on SCr of 6.89 mg/dL (H)). Liver Function Tests: Recent Labs  Lab 11/30/23 1407 12/02/23 0905 12/04/23 0547 12/07/23 0608  ALBUMIN 2.4* 1.9* 1.8* 1.8*   No results for input(s): "LIPASE", "AMYLASE" in the last 168 hours.  No results for input(s): "AMMONIA" in the last 168 hours.  Coagulation profile No results for input(s): "INR", "PROTIME" in the last 168 hours.   CBC: Recent Labs  Lab 11/30/23 1407 12/02/23 0905 12/04/23 0547 12/07/23 0807  WBC 11.5* 8.9 7.4 5.7  HGB 8.9* 8.2* 8.0* 7.7*  HCT 26.9* 24.1* 23.5* 23.8*  MCV 91.2 90.3 87.7 92.6  PLT 190 140* 149* 136*    CBG: Recent Labs  Lab 12/03/23 1223 12/04/23 1433 12/05/23 0547 12/05/23 0746 12/06/23 0753  GLUCAP 98 69* 81 73 88     LOS: 11 days     Pennie Banter, DO  [facility]   Pager on [email or web address]. If 7PM-7AM, please contact night-coverage at [email or web address]     12/07/2023, 12:21 PM

## 2023-12-07 NOTE — Progress Notes (Signed)
 OT Cancellation Note  Patient Details Name: KEVONTAE BURGOON MRN: [id number] DOB: [birth year]   Cancelled Treatment:    Reason Eval/Treat Not Completed: Patient at procedure or test/ unavailable Pt currently off unit for HD. Will re-attempt another time.    Maimouna Rondeau L. Larosa Rhines, OTR/L  12/07/23, 9:20 AM

## 2023-12-07 NOTE — Progress Notes (Signed)
 PT Cancellation Note  Patient Details Name: Casey Franco MRN: [id number] DOB: April 15, 1961   Cancelled Treatment:    Reason Eval/Treat Not Completed: Other (comment). Pt currently off unit for HD. Will re-attempt another time.   Miyoko Hashimi 12/07/2023, 9:12 AM Elizabeth Palau, PT, DPT, GCS [phone]

## 2023-12-07 NOTE — Plan of Care (Signed)
   Problem: Education: Goal: Knowledge of General Education information will improve Description: Including pain rating scale, medication(s)/side effects and non-pharmacologic comfort measures Outcome: Progressing   Problem: Clinical Measurements: Goal: Ability to maintain clinical measurements within normal limits will improve Outcome: Progressing   Problem: Clinical Measurements: Goal: Respiratory complications will improve Outcome: Progressing   Problem: Clinical Measurements: Goal: Cardiovascular complication will be avoided Outcome: Progressing

## 2023-12-07 NOTE — Progress Notes (Signed)
 Patient off the floor at this time for dialysis

## 2023-12-07 NOTE — Progress Notes (Signed)
 [facility]  ROUNDING NOTE   Subjective:   Casey Franco is a 63 y.o. male with past medical history of HTN, ESRD, DM-2, and Hep C. Patient presents to emergency department with abdominal pain. He has been admitted for Intractable vomiting [R11.10] Abdominal pain [R10.9] Pneumonia of both lungs due to infectious organism, unspecified part of lung [J18.9] Sepsis without acute organ dysfunction, due to unspecified organism [facility]) [A41.9]  Patient is known to our practice and receives outpatient dialysis at [facility] on a MWF, last treatment on Tuesday.    Update Patient seen and evaluated during dialysis   HEMODIALYSIS FLOWSHEET:  Blood Flow Rate (mL/min): 350 mL/min Arterial Pressure (mmHg): -139.39 mmHg Venous Pressure (mmHg): 181.61 mmHg TMP (mmHg): 4.44 mmHg Ultrafiltration Rate (mL/min): 267 mL/min Dialysate Flow Rate (mL/min): 299 ml/min  Tolerating treatment well   Objective:  Vital signs in last 24 hours:  Temp:  [98.2 F (36.8 C)-98.6 F (37 C)] 98.4 F (36.9 C) (02/24 0746) Pulse Rate:  [76-78] 77 (02/24 1100) Resp:  [15-20] 15 (02/24 1100) BP: (88-119)/(65-83) 111/80 (02/24 1100) SpO2:  [97 %-100 %] 97 % (02/24 1100) Weight:  [57.9 kg] 57.9 kg (02/24 0746)  Weight change:  Filed Weights   12/04/23 0931 12/04/23 1331 12/07/23 0746  Weight: 57.1 kg 57.1 kg 57.9 kg    Intake/Output: I/O last 3 completed shifts: In: 360 [P.O.:360] Out: -    Intake/Output this shift:  No intake/output data recorded.  Physical Exam: General: NAD  Head: Normocephalic, atraumatic. Moist oral mucosal membranes  Eyes: Anicteric  Lungs:  Clear to auscultation, normal effort  Heart: Regular rate and rhythm, aflutter  Abdomen:  Soft, tender, mild distension  Extremities: No peripheral edema.  Neurologic: Alert and oriented, moving all four extremities  Skin: No lesions  Access: Right chest tunneled catheter    Basic Metabolic Panel: Recent Labs   Lab 11/30/23 1407 12/02/23 0905 12/04/23 0547 12/07/23 0608  NA 137 132* 132* 134*  K 4.7 3.7 3.4* 3.4*  CL 96* 97* 97* 101  CO2 16* 18* 21* 21*  GLUCOSE 93 106* 79 96  BUN 94* 72* 47* 38*  CREATININE 9.72* 7.29* 6.05* 6.89*  CALCIUM 8.6* 8.0* 7.6* 7.8*  PHOS 5.8* 5.1* 5.1* 4.3    Liver Function Tests: Recent Labs  Lab 11/30/23 1407 12/02/23 0905 12/04/23 0547 12/07/23 0608  ALBUMIN 2.4* 1.9* 1.8* 1.8*   No results for input(s): "LIPASE", "AMYLASE" in the last 168 hours.  No results for input(s): "AMMONIA" in the last 168 hours.   CBC: Recent Labs  Lab 11/30/23 1407 12/02/23 0905 12/04/23 0547 12/07/23 0807  WBC 11.5* 8.9 7.4 5.7  HGB 8.9* 8.2* 8.0* 7.7*  HCT 26.9* 24.1* 23.5* 23.8*  MCV 91.2 90.3 87.7 92.6  PLT 190 140* 149* 136*    Cardiac Enzymes: No results for input(s): "CKTOTAL", "CKMB", "CKMBINDEX", "TROPONINI" in the last 168 hours.  BNP: Invalid input(s): "POCBNP"  CBG: Recent Labs  Lab 12/03/23 1223 12/04/23 1433 12/05/23 0547 12/05/23 0746 12/06/23 0753  GLUCAP 98 69* 81 73 88    Microbiology: Results for orders placed or performed during the hospital encounter of 11/26/23  Culture, blood (Routine x 2)     Status: Abnormal   Collection Time: 11/26/23 11:22 AM   Specimen: BLOOD  Result Value Ref Range Status   Specimen Description   Final    BLOOD RIGHT ANTECUBITAL Performed at [facility], [address], [place], [place] [address]  Special Requests   Final    BOTTLES DRAWN AEROBIC AND ANAEROBIC Blood Culture results may not be optimal due to an inadequate volume of blood received in culture bottles Performed at [facility], [address] [place], [place] [address]    Culture  Setup Time   Final    ANAEROBIC BOTTLE ONLY GRAM NEGATIVE RODS CRITICAL RESULT CALLED TO, READ BACK BY AND VERIFIED WITH: CAROLINE COULTER @ 11/28/23 2033 AB    Culture (A)  Final    BACTEROIDES THETAIOTAOMICRON BETA  LACTAMASE POSITIVE Performed at [facility], [address], [place], [place] [address]    Report Status 12/01/2023 FINAL  Final  Blood Culture ID Panel (Reflexed)     Status: None   Collection Time: 11/26/23 11:22 AM  Result Value Ref Range Status   Enterococcus faecalis NOT DETECTED NOT DETECTED Final   Enterococcus Faecium NOT DETECTED NOT DETECTED Final   Listeria monocytogenes NOT DETECTED NOT DETECTED Final   Staphylococcus species NOT DETECTED NOT DETECTED Final   Staphylococcus aureus (BCID) NOT DETECTED NOT DETECTED Final   Staphylococcus epidermidis NOT DETECTED NOT DETECTED Final   Staphylococcus lugdunensis NOT DETECTED NOT DETECTED Final   Streptococcus species NOT DETECTED NOT DETECTED Final   Streptococcus agalactiae NOT DETECTED NOT DETECTED Final   Streptococcus pneumoniae NOT DETECTED NOT DETECTED Final   Streptococcus pyogenes NOT DETECTED NOT DETECTED Final   A.calcoaceticus-baumannii NOT DETECTED NOT DETECTED Final   Bacteroides fragilis NOT DETECTED NOT DETECTED Final   Enterobacterales NOT DETECTED NOT DETECTED Final   Enterobacter cloacae complex NOT DETECTED NOT DETECTED Final   Escherichia coli NOT DETECTED NOT DETECTED Final   Klebsiella aerogenes NOT DETECTED NOT DETECTED Final   Klebsiella oxytoca NOT DETECTED NOT DETECTED Final   Klebsiella pneumoniae NOT DETECTED NOT DETECTED Final   Proteus species NOT DETECTED NOT DETECTED Final   Salmonella species NOT DETECTED NOT DETECTED Final   Serratia marcescens NOT DETECTED NOT DETECTED Final   Haemophilus influenzae NOT DETECTED NOT DETECTED Final   Neisseria meningitidis NOT DETECTED NOT DETECTED Final   Pseudomonas aeruginosa NOT DETECTED NOT DETECTED Final   Stenotrophomonas maltophilia NOT DETECTED NOT DETECTED Final   Candida albicans NOT DETECTED NOT DETECTED Final   Candida auris NOT DETECTED NOT DETECTED Final   Candida glabrata NOT DETECTED NOT DETECTED Final   Candida krusei NOT DETECTED  NOT DETECTED Final   Candida parapsilosis NOT DETECTED NOT DETECTED Final   Candida tropicalis NOT DETECTED NOT DETECTED Final   Cryptococcus neoformans/gattii NOT DETECTED NOT DETECTED Final    Comment: Performed at [facility], [address] [place], [place] [address]  Culture, blood (Routine x 2)     Status: Abnormal   Collection Time: 11/26/23 12:23 PM   Specimen: BLOOD LEFT ARM  Result Value Ref Range Status   Specimen Description   Final    BLOOD LEFT ARM Performed at [facility], [address] [place], [place] [address]    Special Requests   Final    BOTTLES DRAWN AEROBIC AND ANAEROBIC Blood Culture results may not be optimal due to an inadequate volume of blood received in culture bottles Performed at [facility], [address] [place], [place] [address]    Culture  Setup Time   Final    GRAM POSITIVE COCCI AEROBIC BOTTLE ONLY CRITICAL RESULT CALLED TO, READ BACK BY AND VERIFIED WITH: BRIANA ALLEY ON 11/27/23 AT 1107 QSD    Culture (A)  Final    STAPHYLOCOCCUS EPIDERMIDIS THE SIGNIFICANCE OF ISOLATING THIS ORGANISM FROM A SINGLE SET OF BLOOD CULTURES WHEN MULTIPLE SETS ARE DRAWN IS UNCERTAIN. PLEASE NOTIFY THE MICROBIOLOGY DEPARTMENT WITHIN ONE WEEK IF SPECIATION AND SENSITIVITIES ARE REQUIRED. Performed at [facility], [address] [place], [place] [address]    Report Status 11/29/2023 FINAL  Final  Blood Culture ID Panel (Reflexed)     Status: Abnormal   Collection Time: 11/26/23 12:23 PM  Result Value Ref Range Status   Enterococcus faecalis NOT DETECTED NOT DETECTED Final   Enterococcus Faecium NOT DETECTED NOT DETECTED Final   Listeria monocytogenes NOT DETECTED NOT DETECTED Final   Staphylococcus species DETECTED (A) NOT DETECTED Final    Comment: CRITICAL RESULT CALLED TO, READ BACK BY AND VERIFIED WITH: BRIANA ALLEY ON 11/27/23 AT 1107 QSD    Staphylococcus aureus (BCID) NOT DETECTED NOT DETECTED Final   Staphylococcus  epidermidis DETECTED (A) NOT DETECTED Final    Comment: CRITICAL RESULT CALLED TO, READ BACK BY AND VERIFIED WITH: BRIANA ALLEY ON 11/27/23 AT 1107 QSD    Staphylococcus lugdunensis NOT DETECTED NOT DETECTED Final   Streptococcus species NOT DETECTED NOT DETECTED Final   Streptococcus agalactiae NOT DETECTED NOT DETECTED Final   Streptococcus pneumoniae NOT DETECTED NOT DETECTED Final   Streptococcus pyogenes NOT DETECTED NOT DETECTED Final   A.calcoaceticus-baumannii NOT DETECTED NOT DETECTED Final   Bacteroides fragilis NOT DETECTED NOT DETECTED Final   Enterobacterales NOT DETECTED NOT DETECTED Final   Enterobacter cloacae complex NOT DETECTED NOT DETECTED Final   Escherichia coli NOT DETECTED NOT DETECTED Final   Klebsiella aerogenes NOT DETECTED NOT DETECTED Final   Klebsiella oxytoca NOT DETECTED NOT DETECTED Final   Klebsiella pneumoniae NOT DETECTED NOT DETECTED Final   Proteus species NOT DETECTED NOT DETECTED Final   Salmonella species NOT DETECTED NOT DETECTED Final   Serratia marcescens NOT DETECTED NOT DETECTED Final   Haemophilus influenzae NOT DETECTED NOT DETECTED Final   Neisseria meningitidis NOT DETECTED NOT DETECTED Final   Pseudomonas aeruginosa NOT DETECTED NOT DETECTED Final   Stenotrophomonas maltophilia NOT DETECTED NOT DETECTED Final   Candida albicans NOT DETECTED NOT DETECTED Final   Candida auris NOT DETECTED NOT DETECTED Final   Candida glabrata NOT DETECTED NOT DETECTED Final   Candida krusei NOT DETECTED NOT DETECTED Final   Candida parapsilosis NOT DETECTED NOT DETECTED Final   Candida tropicalis NOT DETECTED NOT DETECTED Final   Cryptococcus neoformans/gattii NOT DETECTED NOT DETECTED Final   Methicillin resistance mecA/C NOT DETECTED NOT DETECTED Final    Comment: Performed at [facility], [address] [place], [place] [address]  Body fluid culture w Gram Stain     Status: None   Collection Time: 11/26/23  8:55 PM   Specimen: PATH  Cytology Peritoneal fluid  Result Value Ref Range Status   Specimen Description   Final    PERITONEAL CYTO Performed at [facility], [address] [place], [place] [address]    Special Requests   Final    NONE Performed at [facility], [address] [place], [place] [address]    Gram Stain   Final    FEW WBC PRESENT,BOTH PMN AND MONONUCLEAR FEW GRAM POSITIVE COCCI IN CHAINS MODERATE GRAM NEGATIVE RODS FEW GRAM POSITIVE RODS Performed at [facility], [address] [place], [place] [address]    Culture   Final    MODERATE STREPTOCOCCUS MITIS/ORALIS RARE CITROBACTER AMALONATICUS  Report Status 11/30/2023 FINAL  Final   Organism ID, Bacteria STREPTOCOCCUS MITIS/ORALIS  Final   Organism ID, Bacteria CITROBACTER AMALONATICUS  Final      Susceptibility   Citrobacter amalonaticus - MIC*    CEFEPIME <=0.12 SENSITIVE Sensitive     CEFTAZIDIME <=1 SENSITIVE Sensitive     CEFTRIAXONE <=0.25 SENSITIVE Sensitive     CIPROFLOXACIN <=0.25 SENSITIVE Sensitive     GENTAMICIN <=1 SENSITIVE Sensitive     IMIPENEM <=0.25 SENSITIVE Sensitive     TRIMETH/SULFA <=20 SENSITIVE Sensitive     PIP/TAZO <=4 SENSITIVE Sensitive ug/mL    * RARE CITROBACTER AMALONATICUS   Streptococcus mitis/oralis - MIC*    PENICILLIN 0.25 INTERMEDIATE Intermediate     CEFTRIAXONE 0.25 SENSITIVE Sensitive     LEVOFLOXACIN 1 SENSITIVE Sensitive     VANCOMYCIN 0.5 SENSITIVE Sensitive     * MODERATE STREPTOCOCCUS MITIS/ORALIS  Culture, blood (Routine X 2) w Reflex to ID Panel     Status: None   Collection Time: 11/28/23  5:43 AM   Specimen: BLOOD LEFT ARM  Result Value Ref Range Status   Specimen Description BLOOD LEFT ARM  Final   Special Requests   Final    BOTTLES DRAWN AEROBIC AND ANAEROBIC Blood Culture results may not be optimal due to an inadequate volume of blood received in culture bottles   Culture   Final    NO GROWTH 5 DAYS Performed at [facility], [address] [place], [place] [address]    Report Status 12/03/2023 FINAL  Final  Culture, blood (Routine X 2) w Reflex to ID Panel     Status: None   Collection Time: 11/28/23  5:47 AM   Specimen: BLOOD RIGHT HAND  Result Value Ref Range Status   Specimen Description BLOOD RIGHT HAND  Final   Special Requests   Final    BOTTLES DRAWN AEROBIC AND ANAEROBIC Blood Culture results may not be optimal due to an inadequate volume of blood received in culture bottles   Culture   Final    NO GROWTH 5 DAYS Performed at [facility], [address] [place], [place] [address]    Report Status 12/03/2023 FINAL  Final  Body fluid culture w Gram Stain     Status: None   Collection Time: 12/01/23 10:10 AM   Specimen: Ascitic; Body Fluid  Result Value Ref Range Status   Specimen Description   Final    ASCITIC Performed at [facility], [address] [place], [place] [address]    Special Requests   Final    peri Performed at [facility], [address] [place], [place] [address]    Gram Stain   Final    FEW WBC PRESENT, PREDOMINANTLY PMN ABUNDANT GRAM NEGATIVE RODS ABUNDANT GRAM POSITIVE COCCI RARE GRAM POSITIVE RODS    Culture   Final    FEW STREPTOCOCCUS MITIS/ORALIS RARE CITROBACTER AMALONATICUS MODERATE BACTEROIDES THETAIOTAOMICRON BETA LACTAMASE POSITIVE RARE EIKENELLA CORRODENS Usually susceptible to penicillin and other beta lactam agents,quinolones,macrolides and tetracyclines. Performed at [facility], [address] [place], [place] [address]    Report Status 12/06/2023 FINAL  Final   Organism ID, Bacteria STREPTOCOCCUS MITIS/ORALIS  Final   Organism ID, Bacteria CITROBACTER AMALONATICUS  Final      Susceptibility   Citrobacter amalonaticus - MIC*    CEFEPIME <=0.12 SENSITIVE Sensitive     CEFTAZIDIME <=1 SENSITIVE Sensitive     CEFTRIAXONE <=0.25 SENSITIVE Sensitive  CIPROFLOXACIN <=0.25 SENSITIVE Sensitive     GENTAMICIN <=1  SENSITIVE Sensitive     IMIPENEM <=0.25 SENSITIVE Sensitive     TRIMETH/SULFA <=20 SENSITIVE Sensitive     PIP/TAZO <=4 SENSITIVE Sensitive ug/mL    * RARE CITROBACTER AMALONATICUS   Streptococcus mitis/oralis - MIC*    PENICILLIN <=0.06 SENSITIVE Sensitive     CEFTRIAXONE 0.25 SENSITIVE Sensitive     LEVOFLOXACIN 1 SENSITIVE Sensitive     VANCOMYCIN 0.5 SENSITIVE Sensitive     * FEW STREPTOCOCCUS MITIS/ORALIS    Coagulation Studies: No results for input(s): "LABPROT", "INR" in the last 72 hours.   Urinalysis: No results for input(s): "COLORURINE", "LABSPEC", "PHURINE", "GLUCOSEU", "HGBUR", "BILIRUBINUR", "KETONESUR", "PROTEINUR", "UROBILINOGEN", "NITRITE", "LEUKOCYTESUR" in the last 72 hours.  Invalid input(s): "APPERANCEUR"    Imaging: No results found.     Medications:      amLODipine  10 mg Oral Daily   Chlorhexidine Gluconate Cloth  6 each Topical Q0600   epoetin alfa-epbx (RETACRIT) injection  10,000 Units Intravenous Q M,W,F-HD   feeding supplement (NEPRO CARB STEADY)  237 mL Oral BID BM   irbesartan  300 mg Oral Daily   lactulose  20 g Oral BID   levofloxacin  250 mg Oral Daily   metoprolol tartrate  25 mg Oral BID   metroNIDAZOLE  500 mg Oral Q12H   pantoprazole  40 mg Oral Daily   tamsulosin  0.4 mg Oral Daily   acetaminophen, alteplase, alum & mag hydroxide-simeth, antiseptic oral rinse, heparin, hydrALAZINE, hydrOXYzine, ondansetron (ZOFRAN) IV  Assessment/ Plan:  Casey Franco is a 63 y.o.  male with HTN, ESRD, DM-2, Hep C.  Patient presents with chest pain and has been admitted for Intractable vomiting [R11.10] Abdominal pain [R10.9] Pneumonia of both lungs due to infectious organism, unspecified part of lung [J18.9] Sepsis without acute organ dysfunction, due to unspecified organism [facility]) [A41.9]  [facility]/MWF/right chest PermCath/71.0 kg  End-stage renal disease on hemodialysis.  Received dialysis today, no UF.  Next  treatment scheduled for Wednesday.  2. Anemia of chronic kidney disease Lab Results  Component Value Date   HGB 7.7 (L) 12/07/2023    Patient does receive Mircera at outpatient clinic.  Hemoglobin decreased, 7.7.  Will increase to Epogen 10,000 units with dialysis.  3. Secondary Hyperparathyroidism: with outpatient labs: PTH 351, phosphorus 6.6, calcium 8.1 on 11/23/2023.   Lab Results  Component Value Date   PTH 31 08/30/2018   CALCIUM 7.8 (L) 12/07/2023   PHOS 4.3 12/07/2023    Corrected calcium 9.4. Will continue to monitor.   4.  Hypertension with chronic kidney disease.  Home regimen includes furosemide, hydralazine, amlodipine, and irbesartan. Blood pressure 115/76 during dialysis.  5.  Cirrhosis/ascites, spontaneous bacterial peritonitis. Paracentesis from 11/27/2023 yielded 1.6 L of fluid.  Complex loculations suspected as more ascites present. Microbiology results show Staph epidermidis in the blood. Peritoneal fluid culture from 11/26/2023 shows Streptococcus and Citrobacter. Lactulose twice a day. Paracentesis on 12/02/23 with 4..6L removed. Fluid cultures negative  Remains on levofloxacin and  metronidazole.   LOS: 11 Alira Fretwell 2/24/202511:20 AM

## 2023-12-08 DIAGNOSIS — K652 Spontaneous bacterial peritonitis: Secondary | ICD-10-CM | POA: Diagnosis not present

## 2023-12-08 LAB — GLUCOSE, CAPILLARY: Glucose-Capillary: 123 mg/dL — ABNORMAL HIGH (ref 70–99)

## 2023-12-08 NOTE — TOC Progression Note (Addendum)
 Transition of Care [facility]) - Progression Note    Patient Details  Name: Casey Franco MRN: [id number] Date of Birth: 1961/01/29  Transition of Care [facility]) CM/SW Contact  Truddie Hidden, RN Phone Number: 12/08/2023, 4:01 PM  Clinical Narrative:    Spoke with patient's sister to give bed offer for [facility]. She would like [facility]. Call placed to Dabe at [facility] he will review referral and update the HUB.   Spoke with patient's sister to give bed offers for [facility] and [facility]. She would like [facility]. She plans to take a tour of facility today. She was advised [facility] would have to be started.    Spoke with Cl[facility] Cliff to confirm bed offer. He will have to confirm with his dialysis team.            Expected Discharge Plan and Services                                               Social Determinants of Health (SDOH) Interventions SDOH Screenings   Food Insecurity: Patient Declined (11/28/2023)  Housing: Low Risk  (12/06/2023)  Transportation Needs: Patient Declined (11/28/2023)  Utilities: Not At Risk (11/28/2023)  Financial Resource Strain: Low Risk  (05/15/2020)   Received from [facility]  Tobacco Use: Medium Risk (11/26/2023)    Readmission Risk Interventions     No data to display

## 2023-12-08 NOTE — Progress Notes (Signed)
 [facility]  ROUNDING NOTE   Subjective:   Casey Franco is a 63 y.o. male with past medical history of HTN, ESRD, DM-2, and Hep C. Patient presents to emergency department with abdominal pain. He has been admitted for Intractable vomiting [R11.10] Abdominal pain [R10.9] Pneumonia of both lungs due to infectious organism, unspecified part of lung [J18.9] Sepsis without acute organ dysfunction, due to unspecified organism [facility]) [A41.9]  Patient is known to our practice and receives outpatient dialysis at [facility] on a MWF, last treatment on Tuesday.    Update Patient resting quietly in bed Awaiting breakfast Denies pain or discomfort Remains on room air   Objective:  Vital signs in last 24 hours:  Temp:  [98.5 F (36.9 C)-99.5 F (37.5 C)] 98.5 F (36.9 C) (02/25 0905) Pulse Rate:  [76-78] 76 (02/25 0905) Resp:  [18-20] 18 (02/25 0314) BP: (97-111)/(70-77) 102/75 (02/25 0905) SpO2:  [96 %-99 %] 96 % (02/25 0905) Weight:  [59.2 kg] 59.2 kg (02/25 0830)  Weight change:  Filed Weights   12/07/23 0746 12/07/23 1120 12/08/23 0830  Weight: 57.9 kg 57.9 kg 59.2 kg    Intake/Output: No intake/output data recorded.   Intake/Output this shift:  Total I/O In: 120 [P.O.:120] Out: -   Physical Exam: General: NAD  Head: Normocephalic, atraumatic. Moist oral mucosal membranes  Eyes: Anicteric  Lungs:  Clear to auscultation, normal effort  Heart: Regular rate and rhythm, aflutter  Abdomen:  Soft, tender, mild distension  Extremities: No peripheral edema.  Neurologic: Alert and oriented, moving all four extremities  Skin: No lesions  Access: Right chest tunneled catheter    Basic Metabolic Panel: Recent Labs  Lab 12/02/23 0905 12/04/23 0547 12/07/23 0608  NA 132* 132* 134*  K 3.7 3.4* 3.4*  CL 97* 97* 101  CO2 18* 21* 21*  GLUCOSE 106* 79 96  BUN 72* 47* 38*  CREATININE 7.29* 6.05* 6.89*  CALCIUM 8.0* 7.6* 7.8*  PHOS 5.1* 5.1* 4.3     Liver Function Tests: Recent Labs  Lab 12/02/23 0905 12/04/23 0547 12/07/23 0608  ALBUMIN 1.9* 1.8* 1.8*   No results for input(s): "LIPASE", "AMYLASE" in the last 168 hours.  No results for input(s): "AMMONIA" in the last 168 hours.   CBC: Recent Labs  Lab 12/02/23 0905 12/04/23 0547 12/07/23 0807  WBC 8.9 7.4 5.7  HGB 8.2* 8.0* 7.7*  HCT 24.1* 23.5* 23.8*  MCV 90.3 87.7 92.6  PLT 140* 149* 136*    Cardiac Enzymes: No results for input(s): "CKTOTAL", "CKMB", "CKMBINDEX", "TROPONINI" in the last 168 hours.  BNP: Invalid input(s): "POCBNP"  CBG: Recent Labs  Lab 12/04/23 1433 12/05/23 0547 12/05/23 0746 12/06/23 0753 12/08/23 0903  GLUCAP 69* 81 73 88 123*    Microbiology: Results for orders placed or performed during the hospital encounter of 11/26/23  Culture, blood (Routine x 2)     Status: Abnormal   Collection Time: 11/26/23 11:22 AM   Specimen: BLOOD  Result Value Ref Range Status   Specimen Description   Final    BLOOD RIGHT ANTECUBITAL Performed at [facility], [address], [place], [place] [address]    Special Requests   Final    BOTTLES DRAWN AEROBIC AND ANAEROBIC Blood Culture results may not be optimal due to an inadequate volume of blood received in culture bottles Performed at [facility], [address] [place], [place] [address]    Culture  Setup Time   Final  ANAEROBIC BOTTLE ONLY GRAM NEGATIVE RODS CRITICAL RESULT CALLED TO, READ BACK BY AND VERIFIED WITH: CAROLINE COULTER @ 11/28/23 2033 AB    Culture (A)  Final    BACTEROIDES THETAIOTAOMICRON BETA LACTAMASE POSITIVE Performed at [facility], [address], [place], [place] [address]    Report Status 12/01/2023 FINAL  Final  Blood Culture ID Panel (Reflexed)     Status: None   Collection Time: 11/26/23 11:22 AM  Result Value Ref Range Status   Enterococcus faecalis NOT DETECTED NOT DETECTED Final   Enterococcus Faecium NOT DETECTED NOT  DETECTED Final   Listeria monocytogenes NOT DETECTED NOT DETECTED Final   Staphylococcus species NOT DETECTED NOT DETECTED Final   Staphylococcus aureus (BCID) NOT DETECTED NOT DETECTED Final   Staphylococcus epidermidis NOT DETECTED NOT DETECTED Final   Staphylococcus lugdunensis NOT DETECTED NOT DETECTED Final   Streptococcus species NOT DETECTED NOT DETECTED Final   Streptococcus agalactiae NOT DETECTED NOT DETECTED Final   Streptococcus pneumoniae NOT DETECTED NOT DETECTED Final   Streptococcus pyogenes NOT DETECTED NOT DETECTED Final   A.calcoaceticus-baumannii NOT DETECTED NOT DETECTED Final   Bacteroides fragilis NOT DETECTED NOT DETECTED Final   Enterobacterales NOT DETECTED NOT DETECTED Final   Enterobacter cloacae complex NOT DETECTED NOT DETECTED Final   Escherichia coli NOT DETECTED NOT DETECTED Final   Klebsiella aerogenes NOT DETECTED NOT DETECTED Final   Klebsiella oxytoca NOT DETECTED NOT DETECTED Final   Klebsiella pneumoniae NOT DETECTED NOT DETECTED Final   Proteus species NOT DETECTED NOT DETECTED Final   Salmonella species NOT DETECTED NOT DETECTED Final   Serratia marcescens NOT DETECTED NOT DETECTED Final   Haemophilus influenzae NOT DETECTED NOT DETECTED Final   Neisseria meningitidis NOT DETECTED NOT DETECTED Final   Pseudomonas aeruginosa NOT DETECTED NOT DETECTED Final   Stenotrophomonas maltophilia NOT DETECTED NOT DETECTED Final   Candida albicans NOT DETECTED NOT DETECTED Final   Candida auris NOT DETECTED NOT DETECTED Final   Candida glabrata NOT DETECTED NOT DETECTED Final   Candida krusei NOT DETECTED NOT DETECTED Final   Candida parapsilosis NOT DETECTED NOT DETECTED Final   Candida tropicalis NOT DETECTED NOT DETECTED Final   Cryptococcus neoformans/gattii NOT DETECTED NOT DETECTED Final    Comment: Performed at [facility], [address] [place], [place] [address]  Culture, blood (Routine x 2)     Status: Abnormal   Collection Time:  11/26/23 12:23 PM   Specimen: BLOOD LEFT ARM  Result Value Ref Range Status   Specimen Description   Final    BLOOD LEFT ARM Performed at [facility], [address], [place], [place] [address]    Special Requests   Final    BOTTLES DRAWN AEROBIC AND ANAEROBIC Blood Culture results may not be optimal due to an inadequate volume of blood received in culture bottles Performed at [facility], [address] [place], [place] [address]    Culture  Setup Time   Final    GRAM POSITIVE COCCI AEROBIC BOTTLE ONLY CRITICAL RESULT CALLED TO, READ BACK BY AND VERIFIED WITH: BRIANA ALLEY ON 11/27/23 AT 1107 QSD    Culture (A)  Final    STAPHYLOCOCCUS EPIDERMIDIS THE SIGNIFICANCE OF ISOLATING THIS ORGANISM FROM A SINGLE SET OF BLOOD CULTURES WHEN MULTIPLE SETS ARE DRAWN IS UNCERTAIN. PLEASE NOTIFY THE MICROBIOLOGY DEPARTMENT WITHIN ONE WEEK IF SPECIATION AND SENSITIVITIES ARE REQUIRED. Performed at [facility], [address] [place], [place] [address]  Report Status 11/29/2023 FINAL  Final  Blood Culture ID Panel (Reflexed)     Status: Abnormal   Collection Time: 11/26/23 12:23 PM  Result Value Ref Range Status   Enterococcus faecalis NOT DETECTED NOT DETECTED Final   Enterococcus Faecium NOT DETECTED NOT DETECTED Final   Listeria monocytogenes NOT DETECTED NOT DETECTED Final   Staphylococcus species DETECTED (A) NOT DETECTED Final    Comment: CRITICAL RESULT CALLED TO, READ BACK BY AND VERIFIED WITH: BRIANA ALLEY ON 11/27/23 AT 1107 QSD    Staphylococcus aureus (BCID) NOT DETECTED NOT DETECTED Final   Staphylococcus epidermidis DETECTED (A) NOT DETECTED Final    Comment: CRITICAL RESULT CALLED TO, READ BACK BY AND VERIFIED WITH: BRIANA ALLEY ON 11/27/23 AT 1107 QSD    Staphylococcus lugdunensis NOT DETECTED NOT DETECTED Final   Streptococcus species NOT DETECTED NOT DETECTED Final   Streptococcus agalactiae NOT DETECTED NOT DETECTED Final   Streptococcus  pneumoniae NOT DETECTED NOT DETECTED Final   Streptococcus pyogenes NOT DETECTED NOT DETECTED Final   A.calcoaceticus-baumannii NOT DETECTED NOT DETECTED Final   Bacteroides fragilis NOT DETECTED NOT DETECTED Final   Enterobacterales NOT DETECTED NOT DETECTED Final   Enterobacter cloacae complex NOT DETECTED NOT DETECTED Final   Escherichia coli NOT DETECTED NOT DETECTED Final   Klebsiella aerogenes NOT DETECTED NOT DETECTED Final   Klebsiella oxytoca NOT DETECTED NOT DETECTED Final   Klebsiella pneumoniae NOT DETECTED NOT DETECTED Final   Proteus species NOT DETECTED NOT DETECTED Final   Salmonella species NOT DETECTED NOT DETECTED Final   Serratia marcescens NOT DETECTED NOT DETECTED Final   Haemophilus influenzae NOT DETECTED NOT DETECTED Final   Neisseria meningitidis NOT DETECTED NOT DETECTED Final   Pseudomonas aeruginosa NOT DETECTED NOT DETECTED Final   Stenotrophomonas maltophilia NOT DETECTED NOT DETECTED Final   Candida albicans NOT DETECTED NOT DETECTED Final   Candida auris NOT DETECTED NOT DETECTED Final   Candida glabrata NOT DETECTED NOT DETECTED Final   Candida krusei NOT DETECTED NOT DETECTED Final   Candida parapsilosis NOT DETECTED NOT DETECTED Final   Candida tropicalis NOT DETECTED NOT DETECTED Final   Cryptococcus neoformans/gattii NOT DETECTED NOT DETECTED Final   Methicillin resistance mecA/C NOT DETECTED NOT DETECTED Final    Comment: Performed at [facility], [address] [place], [place] [address]  Body fluid culture w Gram Stain     Status: None   Collection Time: 11/26/23  8:55 PM   Specimen: PATH Cytology Peritoneal fluid  Result Value Ref Range Status   Specimen Description   Final    PERITONEAL CYTO Performed at [facility], [address] [place], [place] [address]    Special Requests   Final    NONE Performed at [facility], [address] [place], [place] [address]    Gram Stain   Final    FEW WBC  PRESENT,BOTH PMN AND MONONUCLEAR FEW GRAM POSITIVE COCCI IN CHAINS MODERATE GRAM NEGATIVE RODS FEW GRAM POSITIVE RODS Performed at [facility], [address], [place], [place] [address]    Culture   Final    MODERATE STREPTOCOCCUS MITIS/ORALIS RARE CITROBACTER AMALONATICUS    Report Status 11/30/2023 FINAL  Final   Organism ID, Bacteria STREPTOCOCCUS MITIS/ORALIS  Final   Organism ID, Bacteria CITROBACTER AMALONATICUS  Final      Susceptibility   Citrobacter amalonaticus - MIC*    CEFEPIME <=0.12 SENSITIVE Sensitive     CEFTAZIDIME <=1 SENSITIVE Sensitive  CEFTRIAXONE <=0.25 SENSITIVE Sensitive     CIPROFLOXACIN <=0.25 SENSITIVE Sensitive     GENTAMICIN <=1 SENSITIVE Sensitive     IMIPENEM <=0.25 SENSITIVE Sensitive     TRIMETH/SULFA <=20 SENSITIVE Sensitive     PIP/TAZO <=4 SENSITIVE Sensitive ug/mL    * RARE CITROBACTER AMALONATICUS   Streptococcus mitis/oralis - MIC*    PENICILLIN 0.25 INTERMEDIATE Intermediate     CEFTRIAXONE 0.25 SENSITIVE Sensitive     LEVOFLOXACIN 1 SENSITIVE Sensitive     VANCOMYCIN 0.5 SENSITIVE Sensitive     * MODERATE STREPTOCOCCUS MITIS/ORALIS  Culture, blood (Routine X 2) w Reflex to ID Panel     Status: None   Collection Time: 11/28/23  5:43 AM   Specimen: BLOOD LEFT ARM  Result Value Ref Range Status   Specimen Description BLOOD LEFT ARM  Final   Special Requests   Final    BOTTLES DRAWN AEROBIC AND ANAEROBIC Blood Culture results may not be optimal due to an inadequate volume of blood received in culture bottles   Culture   Final    NO GROWTH 5 DAYS Performed at [facility], [address] [place], [place] [address]    Report Status 12/03/2023 FINAL  Final  Culture, blood (Routine X 2) w Reflex to ID Panel     Status: None   Collection Time: 11/28/23  5:47 AM   Specimen: BLOOD RIGHT HAND  Result Value Ref Range Status   Specimen Description BLOOD RIGHT HAND  Final   Special Requests   Final    BOTTLES DRAWN  AEROBIC AND ANAEROBIC Blood Culture results may not be optimal due to an inadequate volume of blood received in culture bottles   Culture   Final    NO GROWTH 5 DAYS Performed at [facility], [address], [place], [place] [address]    Report Status 12/03/2023 FINAL  Final  Body fluid culture w Gram Stain     Status: None   Collection Time: 12/01/23 10:10 AM   Specimen: Ascitic; Body Fluid  Result Value Ref Range Status   Specimen Description   Final    ASCITIC Performed at [facility], [address] [place], [place] [address]    Special Requests   Final    peri Performed at [facility], [address] [place], [place] [address]    Gram Stain   Final    FEW WBC PRESENT, PREDOMINANTLY PMN ABUNDANT GRAM NEGATIVE RODS ABUNDANT GRAM POSITIVE COCCI RARE GRAM POSITIVE RODS    Culture   Final    FEW STREPTOCOCCUS MITIS/ORALIS RARE CITROBACTER AMALONATICUS MODERATE BACTEROIDES THETAIOTAOMICRON BETA LACTAMASE POSITIVE RARE EIKENELLA CORRODENS Usually susceptible to penicillin and other beta lactam agents,quinolones,macrolides and tetracyclines. Performed at [facility], [address] [place], [place] [address]    Report Status 12/06/2023 FINAL  Final   Organism ID, Bacteria STREPTOCOCCUS MITIS/ORALIS  Final   Organism ID, Bacteria CITROBACTER AMALONATICUS  Final      Susceptibility   Citrobacter amalonaticus - MIC*    CEFEPIME <=0.12 SENSITIVE Sensitive     CEFTAZIDIME <=1 SENSITIVE Sensitive     CEFTRIAXONE <=0.25 SENSITIVE Sensitive     CIPROFLOXACIN <=0.25 SENSITIVE Sensitive     GENTAMICIN <=1 SENSITIVE Sensitive     IMIPENEM <=0.25 SENSITIVE Sensitive     TRIMETH/SULFA <=20 SENSITIVE Sensitive     PIP/TAZO <=4 SENSITIVE Sensitive ug/mL    * RARE CITROBACTER AMALONATICUS   Streptococcus mitis/oralis - MIC*    PENICILLIN <=  0.06 SENSITIVE Sensitive     CEFTRIAXONE 0.25 SENSITIVE Sensitive     LEVOFLOXACIN 1 SENSITIVE Sensitive      VANCOMYCIN 0.5 SENSITIVE Sensitive     * FEW STREPTOCOCCUS MITIS/ORALIS    Coagulation Studies: No results for input(s): "LABPROT", "INR" in the last 72 hours.   Urinalysis: No results for input(s): "COLORURINE", "LABSPEC", "PHURINE", "GLUCOSEU", "HGBUR", "BILIRUBINUR", "KETONESUR", "PROTEINUR", "UROBILINOGEN", "NITRITE", "LEUKOCYTESUR" in the last 72 hours.  Invalid input(s): "APPERANCEUR"    Imaging: DG Abd 1 View Result Date: 12/07/2023 CLINICAL DATA:  Abdominal pain, nausea/vomiting after dialysis EXAM: ABDOMEN - 1 VIEW COMPARISON:  None Available. FINDINGS: Nonobstructive bowel gas pattern. Visualized osseous structures are within normal limits. Vascular calcifications. IMPRESSION: Negative. Electronically Signed   By: Charline Bills M.D.   On: 12/07/2023 20:10       Medications:      amLODipine  10 mg Oral Daily   Chlorhexidine Gluconate Cloth  6 each Topical Q0600   epoetin alfa-epbx (RETACRIT) injection  10,000 Units Intravenous Q M,W,F-HD   feeding supplement (NEPRO CARB STEADY)  237 mL Oral BID BM   irbesartan  300 mg Oral Daily   lactulose  20 g Oral BID   levofloxacin  250 mg Oral Daily   metoprolol tartrate  25 mg Oral BID   metroNIDAZOLE  500 mg Oral Q12H   pantoprazole  40 mg Oral Daily   tamsulosin  0.4 mg Oral Daily   acetaminophen, alum & mag hydroxide-simeth, antiseptic oral rinse, hydrALAZINE, hydrOXYzine, ondansetron (ZOFRAN) IV  Assessment/ Plan:  Casey Franco is a 63 y.o.  male with HTN, ESRD, DM-2, Hep C.  Patient presents with chest pain and has been admitted for Intractable vomiting [R11.10] Abdominal pain [R10.9] Pneumonia of both lungs due to infectious organism, unspecified part of lung [J18.9] Sepsis without acute organ dysfunction, due to unspecified organism [facility]) [A41.9]  [facility]/MWF/right chest PermCath/71.0 kg  End-stage renal disease on hemodialysis.  Patient received dialysis yesterday, UF 0.  Next  treatment scheduled for Wednesday.  2. Anemia of chronic kidney disease Lab Results  Component Value Date   HGB 7.7 (L) 12/07/2023    Patient does receive Mircera at outpatient clinic.  Continue Epogen 10,000 units with dialysis.  3. Secondary Hyperparathyroidism: with outpatient labs: PTH 351, phosphorus 6.6, calcium 8.1 on 11/23/2023.   Lab Results  Component Value Date   PTH 31 08/30/2018   CALCIUM 7.8 (L) 12/07/2023   PHOS 4.3 12/07/2023    Will continue to monitor bone minerals during this admission.   4.  Hypertension with chronic kidney disease.  Home regimen includes furosemide, hydralazine, amlodipine, and irbesartan. Blood pressure soft for this patient.  Currently receiving amlodipine, irbesartan, and metoprolol.  5.  Cirrhosis/ascites, spontaneous bacterial peritonitis. Paracentesis from 11/27/2023 yielded 1.6 L of fluid.  Complex loculations suspected as more ascites present. Microbiology results show Staph epidermidis in the blood. Peritoneal fluid culture from 11/26/2023 shows Streptococcus and Citrobacter. Lactulose twice a day. Paracentesis on 12/02/23 with 4..6L removed. Fluid cultures negative  Remains on levofloxacin and  metronidazole.   LOS: 12 Myiesha Edgar 2/25/202512:33 PM

## 2023-12-08 NOTE — Progress Notes (Signed)
 Physical Therapy Treatment Patient Details Name: Casey Franco MRN: [id number] DOB: 09-16-1961 Today's Date: 12/08/2023   History of Present Illness presented to ER secondary to abdominal pain, chest pain and coffee-ground emesis; admitted for management of spontaneous bacterial peritonitis.  s/p paracentesis with 1.6L removed on 2/13    PT Comments  Pt is making gradual progress towards goals with ability to ambulate short distance in room. Pt with unsteady gait-does use RW, however demonstrates limited safety awareness and needs supervision for cognition. Pt notes watery BM this date, found sitting in it- needs total assist for hygiene. Pt with fluctuation in mood- at times agreeable to session, however spurts of agitation noted and frustrated that therapist came in the afternoon despite educating that he had OT this morning and didn't want back to back sessions. Pt is high falls risk and prefers to be in bed at end of session. Will continue to progress.    If plan is discharge home, recommend the following: A little help with walking and/or transfers;A little help with bathing/dressing/bathroom;Assistance with cooking/housework;Direct supervision/assist for medications management;Direct supervision/assist for financial management;Assist for transportation;Help with stairs or ramp for entrance   Can travel by private vehicle     Yes  Equipment Recommendations  Rolling walker (2 wheels)    Recommendations for Other Services       Precautions / Restrictions Precautions Precautions: Fall Recall of Precautions/Restrictions: Impaired Precaution/Restrictions Comments: Very impulsive with mobility. Judgement/safety awareness seems to flucctuate. Restrictions Weight Bearing Restrictions Per Provider Order: No     Mobility  Bed Mobility Overal bed mobility: Needs Assistance Bed Mobility: Supine to Sit, Sit to Sidelying Rolling: Supervision   Supine to sit: HOB elevated, Used rails,  Contact guard Sit to supine: Contact guard assist   General bed mobility comments: Request assistance for B LEs. Once seated at EOB, upright posture noted. Once returned to bed, does need use of bed functions to assist in repositioning.    Transfers Overall transfer level: Needs assistance Equipment used: Rolling walker (2 wheels) Transfers: Sit to/from Stand Sit to Stand: Min assist           General transfer comment: able to build momentum in order to stand at bedside. Once standing, RW used. Pt with decreased safety awareness and refuses to not don grip socks. Impulsive technique    Ambulation/Gait Ambulation/Gait assistance: Contact guard assist Gait Distance (Feet): 20 Feet Assistive device: Rolling walker (2 wheels) Gait Pattern/deviations: Step-through pattern       General Gait Details: ambulated in room with reciprocal gait pattern, however pt fatigues quickly and demonstrates impulsive nature.   Stairs             Wheelchair Mobility     Tilt Bed    Modified Rankin (Stroke Patients Only)       Balance Overall balance assessment: Needs assistance Sitting-balance support: Feet supported, No upper extremity supported Sitting balance-Leahy Scale: Good     Standing balance support: Bilateral upper extremity supported, During functional activity, Reliant on assistive device for balance Standing balance-Leahy Scale: Fair                              Hotel manager: No apparent difficulties  Cognition Arousal: Alert Behavior During Therapy: Agitated, Impulsive, Restless   PT - Cognitive impairments: No apparent impairments  PT - Cognition Comments: agreeable to session, however mood flucuates throughout session. He perversates on having a BM Following commands: Intact      Cueing Cueing Techniques: Verbal cues  Exercises Other Exercises Other Exercises: rolling performed in  bed due to pt received soiled with watery BM. Pt able to roll to B sides and need total assist for all hygiene. Once seated at EOB, able to perform seated ADL including using deodorant and applying lotion    General Comments        Pertinent Vitals/Pain Pain Assessment Pain Assessment: No/denies pain Pain Intervention(s): Limited activity within patient's tolerance    Home Living                          Prior Function            PT Goals (current goals can now be found in the care plan section) Acute Rehab PT Goals Patient Stated Goal: rehab then home PT Goal Formulation: With patient Time For Goal Achievement: 12/14/23 Potential to Achieve Goals: Fair Progress towards PT goals: Progressing toward goals    Frequency    Min 1X/week      PT Plan      Co-evaluation              AM-PAC PT "6 Clicks" Mobility   Outcome Measure  Help needed turning from your back to your side while in a flat bed without using bedrails?: None Help needed moving from lying on your back to sitting on the side of a flat bed without using bedrails?: None Help needed moving to and from a bed to a chair (including a wheelchair)?: A Little Help needed standing up from a chair using your arms (e.g., wheelchair or bedside chair)?: A Little Help needed to walk in hospital room?: A Little Help needed climbing 3-5 steps with a railing? : A Lot 6 Click Score: 19    End of Session   Activity Tolerance: Patient tolerated treatment well;Patient limited by fatigue Patient left: in bed;with bed alarm set (reports he plans to get up to the recliner next session- refuses today) Nurse Communication: Mobility status PT Visit Diagnosis: Difficulty in walking, not elsewhere classified (R26.2);Muscle weakness (generalized) (M62.81)     Time: 3244-0102 PT Time Calculation (min) (ACUTE ONLY): 34 min  Charges:    $Gait Training: 8-22 mins $Therapeutic Activity: 8-22 mins PT General  Charges $$ ACUTE PT VISIT: 1 Visit                     Elizabeth Palau, PT, DPT, GCS [phone]    Philo Kurtz 12/08/2023, 3:59 PM

## 2023-12-08 NOTE — Progress Notes (Signed)
 Occupational Therapy Treatment Patient Details Name: Casey Franco MRN: [id number] DOB: 02/28/1961 Today's Date: 12/08/2023   History of present illness presented to ER secondary to abdominal pain, chest pain and coffee-ground emesis; admitted for management of spontaneous bacterial peritonitis.  s/p paracentesis with 1.6L removed on 2/13   OT comments  Casey Franco was seen for OT treatment on this date. Upon arrival to room pt supine in bed, agreeable to OT Tx session with encouragement. OT facilitated ADL management with education and assistance as described below. See ADL section for additional details regarding occupational performance. Pt continues to be functionally limited by decreased safety awareness, limited balance, and increased pain with mobility. Pt return verbalizes understanding of education provided t/o session. Pt is progressing toward OT goals and continues to benefit from skilled OT services to maximize return to PLOF and minimize risk of future falls, injury, caregiver burden, and readmission. Will continue to follow POC as written. Discharge recommendation remains appropriate.        If plan is discharge home, recommend the following:  A little help with walking and/or transfers;A lot of help with bathing/dressing/bathroom;Assistance with cooking/housework;Direct supervision/assist for financial management;Supervision due to cognitive status;Direct supervision/assist for medications management;Help with stairs or ramp for entrance   Equipment Recommendations   (Defer to next venue of care)    Recommendations for Other Services      Precautions / Restrictions Precautions Precautions: Fall Recall of Precautions/Restrictions: Impaired Precaution/Restrictions Comments: Very impulsive with mobility. Judgement/safety awareness seems to flucctuate. Restrictions Weight Bearing Restrictions Per Provider Order: No       Mobility Bed Mobility Overal bed mobility: Needs  Assistance Bed Mobility: Supine to Sit, Sit to Sidelying     Supine to sit: Min assist, HOB elevated, Used rails   Sit to sidelying: Min assist General bed mobility comments: Improved comfort with bed mobility using log-roll technique. Continues to require assist to bring BLE over EOB.    Transfers Overall transfer level: Needs assistance Equipment used: Rolling walker (2 wheels) Transfers: Sit to/from Stand Sit to Stand: Min assist, From elevated surface           General transfer comment: MIN A to side step at EOB, decreased safety awareness noted with functional mobility.     Balance Overall balance assessment: Needs assistance Sitting-balance support: Feet supported, No upper extremity supported Sitting balance-Leahy Scale: Good Sitting balance - Comments: no LOB while seated EOB > 5 minutes during session   Standing balance support: Bilateral upper extremity supported, During functional activity, Reliant on assistive device for balance Standing balance-Leahy Scale: Fair                             ADL either performed or assessed with clinical judgement   ADL Overall ADL's : Needs assistance/impaired     Grooming: Sitting;Wash/dry face;Set up;Supervision/safety Grooming Details (indicate cue type and reason): Seated EOB, requires encouragement to participate in functional activity.         Upper Body Dressing : Minimal assistance Upper Body Dressing Details (indicate cue type and reason): MIN A to reach tie at back         Toileting- Clothing Manipulation and Hygiene: Maximal assistance;Sit to/from stand Toileting - Clothing Manipulation Details (indicate cue type and reason): Max A for peri-care to buttocks while standing at EOB with RW and pt able to perform anterior hygiene with CGA from therapist     Functional mobility during ADLs: Minimal  assistance;Cueing for safety;Cueing for sequencing;Rolling walker (2 wheels) General ADL Comments: MIN  A for STS from EOB, once standing pt impulsive with mobility takes several shuffling steps to R then left rapidly. Requires cueing/re-direction for safety. Pt mildly agitate states "I just wanted to see what I could do!" MIN A from therapist for support t/o STS and side stepping.    Extremity/Trunk Assessment              Vision Patient Visual Report: No change from baseline     Perception     Praxis     Communication Communication Communication: No apparent difficulties   Cognition Arousal: Alert Behavior During Therapy: Restless, WFL for tasks assessed/performed (variable during session.) Cognition: Cognition impaired, Difficult to assess     Awareness: Intellectual awareness impaired Memory impairment (select all impairments): Short-term memory Attention impairment (select first level of impairment): Focused attention Executive functioning impairment (select all impairments): Initiation, Problem solving, Reasoning OT - Cognition Comments: Variable behavior/cognition during sesison. Easily agitated by verbal cueing, but by end of session is pleasant, thankful for opportunity to get cleaned up and engage in functional activity with author.                 Following commands: Intact Following commands impaired: Follows one step commands with increased time      Cueing   Cueing Techniques: Verbal cues  Exercises Other Exercises Other Exercises: Edu on role of OT in acute setting, safety, falls prevention, safe transfer techniques, and DC recs.    Shoulder Instructions       General Comments      Pertinent Vitals/ Pain       Pain Assessment Pain Assessment: 0-10 Pain Score: 8  Pain Location: abdomen Pain Descriptors / Indicators: Grimacing, Guarding, Sore Pain Intervention(s): Limited activity within patient's tolerance, Monitored during session, Repositioned, Patient requesting pain meds-RN notified  Home Living                                           Prior Functioning/Environment              Frequency  Min 1X/week        Progress Toward Goals  OT Goals(current goals can now be found in the care plan section)  Progress towards OT goals: Progressing toward goals  Acute Rehab OT Goals Patient Stated Goal: to go to rehab OT Goal Formulation: With patient Time For Goal Achievement: 12/15/23 Potential to Achieve Goals: Fair  Plan      Co-evaluation                 AM-PAC OT "6 Clicks" Daily Activity     Outcome Measure   Help from another person eating meals?: None Help from another person taking care of personal grooming?: A Little Help from another person toileting, which includes using toliet, bedpan, or urinal?: A Lot Help from another person bathing (including washing, rinsing, drying)?: A Lot Help from another person to put on and taking off regular upper body clothing?: A Little Help from another person to put on and taking off regular lower body clothing?: A Lot 6 Click Score: 16    End of Session Equipment Utilized During Treatment: Rolling walker (2 wheels)  OT Visit Diagnosis: Other abnormalities of gait and mobility (R26.89);Unsteadiness on feet (R26.81);Muscle weakness (generalized) (M62.81)   Activity Tolerance Patient tolerated treatment  well   Patient Left in bed;with call bell/phone within reach;with bed alarm set   Nurse Communication Mobility status        Time: 6045-4098 OT Time Calculation (min): 14 min  Charges: OT General Charges $OT Visit: 1 Visit OT Treatments $Self Care/Home Management : 8-22 mins  Rockney Ghee, M.S., OTR/L 12/08/23, 11:52 AM

## 2023-12-08 NOTE — Plan of Care (Signed)
  Problem: Health Behavior/Discharge Planning: Goal: Ability to manage health-related needs will improve Outcome: Progressing   Problem: Activity: Goal: Risk for activity intolerance will decrease Outcome: Not Progressing

## 2023-12-08 NOTE — Progress Notes (Signed)
 Progress Note    Casey Franco  AVW:[id number] DOB: 1961/05/27  DOA: 11/26/2023 PCP: Casey Franco      Brief Narrative:    Medical records reviewed and are as summarized below:  Casey Franco is a 63 y.o. 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  He also complained of nausea, increasing abdominal distention and multiple episodes of vomiting.      Assessment/Plan:   Principal Problem:   Spontaneous bacterial peritonitis (HCC) Active Problems:   Abdominal pain   Cirrhosis of liver with ascites (HCC)   Myocardial injury   Essential hypertension   ESRD (end stage renal disease) (HCC)   Coffee ground emesis   Paroxysmal atrial fibrillation (HCC)   Sepsis without acute organ dysfunction (HCC)    Body mass index is 17.7 kg/m.   Spontaneous bacterial peritonitis Abdominal pain Liver cirrhosis with ascites: S/p paracentesis with removal of 1600 mL of fluid on 11/26/2023.   Fluid positive for SBP (ascitic fluid total nucleated cells 1,326, percentage neutrophils was 68% and absolute PMN count 901) Ascitic fluid culture showed Streptococcus mitis and Citrobacter amalonaticus S/p repeat paracentesis on 12/01/2023 with removal of 4.6 L of fluid. Percentage neutrophil count was 100 and total nucleated cell count 4,687. He was evaluated by the general surgeon.  No indication for surgery at this time.   --Continue IV Cefepime, Flagyl --Transitioned to PO Levaquin and Flagyl today to complete course   Staph epidermidis bacteremia: 1 out of 4 bottles from 11/26/2023 positive for Staph epidermidis.  This is likely a contaminant.  Repeat blood cultures from 11/27/2023 has not shown any growth thus far.  Bacteroides  thetaiotaomicron bacteremia (blood cultures from  11/26/2023 growing gram-negative rods).   --Continue IV cefepime (day 6) --IV Flagyl added, continue  (day 3) --Dr. Myriam Forehand, prior attending had discussed with Casey Franco, ID pharmacist   Hematemesis: Resolved.  This is probably from multiple episodes of vomiting.   H&H is stable.   Elevated troponins: Troponin is 172, 138, 127 and 128.  No chest pain or acute EKG changes. Due to demand ischemia.   Paroxysmal atrial fibrillation, PVCs: Heart rate is better.  Continue metoprolol.  We discussed risks, benefits and alternatives to long-term anticoagulation for atrial fibrillation. He is at increased risk for bleeding from long-term anticoagulation because of underlying severe liver and kidney disease. CHA2DS2-VASc score is 3 and he is also at increased risk for stroke. He wants to consider Eliquis for stroke prophylaxis. --Resume Eliquis when closer to discharge and clear no additional procedures needed   Chronic systolic CHF Moderate mitral regurgitation 2D echo showed EF estimated at 40 to 45%, moderate LVH, indeterminate left ventricular diastolic parameters, mildly reduced RV systolic function, moderately enlarged RV size, moderately dilated right atrium, small pericardial effusion, moderate mitral regurgitation --Volume management by dialysis  Confusion on 11/30/2023, likely acute toxic encephalopathy from oxycodone: Mental status is better today.  Oxycodone has been discontinued. --Delirium precautions   ESRD on hemodialysis MWF --Nephrology following for hemodialysis   Dysphagia: Speech therapist recommended dysphagia 2 diet, thin liquids.   General weakness: PT recommended discharge to SNF.   Follow-up with TOC to assist with placement.   Comorbidities include hypertension, hyperlipidemia, BPH, type II DM   Goals of care Prognosis is guarded due to multiple chronic co-morbidities. Palliative care  team consulted for goals of care discussions, now following peripherally as  goals are established and remain full code / full scope.    Diet Order             DIET DYS 2 Fluid consistency: Thin  Diet effective now                   Consultants: General surgery Interventional radiology  Procedures: Paracentesis with removal of 1,600 mL of fluid on 11/26/2023 Paracentesis with removal of 4.6 L of fluid on 12/01/2023    Medications:    amLODipine  10 mg Oral Daily   Chlorhexidine Gluconate Cloth  6 each Topical Q0600   epoetin alfa-epbx (RETACRIT) injection  10,000 Units Intravenous Q M,W,F-HD   feeding supplement (NEPRO CARB STEADY)  237 mL Oral BID BM   irbesartan  300 mg Oral Daily   lactulose  20 g Oral BID   levofloxacin  250 mg Oral Daily   metoprolol tartrate  25 mg Oral BID   metroNIDAZOLE  500 mg Oral Q12H   pantoprazole  40 mg Oral Daily   tamsulosin  0.4 mg Oral Daily   Continuous Infusions:     Anti-infectives (From admission, onward)    Start     Dose/Rate Route Frequency Ordered Stop   12/05/23 1000  metroNIDAZOLE (FLAGYL) tablet 500 mg        500 mg Oral Every 12 hours 12/05/23 0807     12/05/23 1000  levofloxacin (LEVAQUIN) tablet 250 mg        250 mg Oral Daily 12/05/23 0807     12/01/23 1445  metroNIDAZOLE (FLAGYL) IVPB 500 mg  Status:  Discontinued        500 mg 100 mL/hr over 60 Minutes Intravenous Every 12 hours 12/01/23 1354 12/05/23 0806   11/28/23 2200  ceFEPIme (MAXIPIME) 1 g in sodium chloride 0.9 % 100 mL IVPB  Status:  Discontinued        1 g 200 mL/hr over 30 Minutes Intravenous Every 24 hours 11/28/23 2120 12/05/23 0806   11/27/23 1300  cefTRIAXone (ROCEPHIN) 2 g in sodium chloride 0.9 % 100 mL IVPB  Status:  Discontinued        2 g 200 mL/hr over 30 Minutes Intravenous Every 24 hours 11/26/23 2027 11/28/23 2120   11/27/23 1200  vancomycin (VANCOREADY) IVPB 1500 mg/300 mL  Status:  Discontinued        1,500 mg 150 mL/hr over 120 Minutes Intravenous  Once 11/27/23 1131 11/27/23 1223   11/26/23 1345   cefTRIAXone (ROCEPHIN) 2 g in sodium chloride 0.9 % 100 mL IVPB        2 g 200 mL/hr over 30 Minutes Intravenous Once 11/26/23 1333 11/26/23 1432   11/26/23 1345  azithromycin (ZITHROMAX) 500 mg in sodium chloride 0.9 % 250 mL IVPB        500 mg 250 mL/hr over 60 Minutes Intravenous  Once 11/26/23 1333 11/26/23 1633              Family Communication/Anticipated D/C date and plan/Code Status   DVT prophylaxis: SCDs Start: 11/26/23 2029     Code Status: Full Code  Family Communication: None Disposition Plan:  SNF/rehab   Status is: Inpatient Remains inpatient appropriate because:  needs SNF placement - pending  Medically stable 12/05/23 for discharge.    Subjective:   Pt sleeping, wakes to voice.  No acute complaints.    Objective:    Vitals:  12/07/23 2257 12/08/23 0314 12/08/23 0830 12/08/23 0905  BP: 102/70 98/73  102/75  Pulse: 78 76  76  Resp: 20 18    Temp: 99.5 F (37.5 C) 98.5 F (36.9 C)  98.5 F (36.9 C)  TempSrc: Oral     SpO2: 98% 97%  96%  Weight:   59.2 kg   Height:       No data found.   Intake/Output Summary (Last 24 hours) at 12/08/2023 1340 Last data filed at 12/08/2023 0958 Gross per 24 hour  Intake 120 ml  Output --  Net 120 ml    Filed Weights   12/07/23 0746 12/07/23 1120 12/08/23 0830  Weight: 57.9 kg 57.9 kg 59.2 kg    Exam:  General exam: sleeping, wakes to voice, no acute distress Respiratory system: normal respiratory effort.on room air, lungs clear Cardiovascular system: RRR, no edema, right upper chest perm cath in place Gastrointestinal system: soft, NT, non-distended Central nervous system: no gross focal neurologic deficits Extremities: no edema, normal tone Psychiatry: normal mood, congruent affect    Data Reviewed:   I have personally reviewed following labs and imaging studies:  Labs: Labs show the following:   Basic Metabolic Panel: Recent Labs  Lab 12/02/23 0905 12/04/23 0547 12/07/23 0608   NA 132* 132* 134*  K 3.7 3.4* 3.4*  CL 97* 97* 101  CO2 18* 21* 21*  GLUCOSE 106* 79 96  BUN 72* 47* 38*  CREATININE 7.29* 6.05* 6.89*  CALCIUM 8.0* 7.6* 7.8*  PHOS 5.1* 5.1* 4.3   GFR Estimated Creatinine Clearance: 9.2 mL/min (A) (by C-G formula based on SCr of 6.89 mg/dL (H)). Liver Function Tests: Recent Labs  Lab 12/02/23 0905 12/04/23 0547 12/07/23 0608  ALBUMIN 1.9* 1.8* 1.8*   No results for input(s): "LIPASE", "AMYLASE" in the last 168 hours.  No results for input(s): "AMMONIA" in the last 168 hours.  Coagulation profile No results for input(s): "INR", "PROTIME" in the last 168 hours.   CBC: Recent Labs  Lab 12/02/23 0905 12/04/23 0547 12/07/23 0807  WBC 8.9 7.4 5.7  HGB 8.2* 8.0* 7.7*  HCT 24.1* 23.5* 23.8*  MCV 90.3 87.7 92.6  PLT 140* 149* 136*    CBG: Recent Labs  Lab 12/04/23 1433 12/05/23 0547 12/05/23 0746 12/06/23 0753 12/08/23 0903  GLUCAP 69* 81 73 88 123*     LOS: 12 days     Pennie Banter, DO  [facility]   Pager on [email or web address]. If 7PM-7AM, please contact night-coverage at [email or web address]     12/08/2023, 1:40 PM

## 2023-12-08 NOTE — Plan of Care (Signed)
   Problem: Health Behavior/Discharge Planning: Goal: Ability to manage health-related needs will improve Outcome: Progressing   Problem: Clinical Measurements: Goal: Ability to maintain clinical measurements within normal limits will improve Outcome: Progressing Goal: Will remain free from infection Outcome: Progressing Goal: Diagnostic test results will improve Outcome: Progressing

## 2023-12-09 DIAGNOSIS — K652 Spontaneous bacterial peritonitis: Secondary | ICD-10-CM | POA: Diagnosis not present

## 2023-12-09 LAB — CBC
HCT: 23.8 % — ABNORMAL LOW (ref 39.0–52.0)
Hemoglobin: 7.9 g/dL — ABNORMAL LOW (ref 13.0–17.0)
MCH: 29.8 pg (ref 26.0–34.0)
MCHC: 33.2 g/dL (ref 30.0–36.0)
MCV: 89.8 fL (ref 80.0–100.0)
Platelets: 136 10*3/uL — ABNORMAL LOW (ref 150–400)
RBC: 2.65 MIL/uL — ABNORMAL LOW (ref 4.22–5.81)
RDW: 16.7 % — ABNORMAL HIGH (ref 11.5–15.5)
WBC: 5.1 10*3/uL (ref 4.0–10.5)
nRBC: 0 % (ref 0.0–0.2)

## 2023-12-09 LAB — RENAL FUNCTION PANEL
Albumin: 1.9 g/dL — ABNORMAL LOW (ref 3.5–5.0)
Anion gap: 12 (ref 5–15)
BUN: 28 mg/dL — ABNORMAL HIGH (ref 8–23)
CO2: 22 mmol/L (ref 22–32)
Calcium: 7.8 mg/dL — ABNORMAL LOW (ref 8.9–10.3)
Chloride: 102 mmol/L (ref 98–111)
Creatinine, Ser: 6.26 mg/dL — ABNORMAL HIGH (ref 0.61–1.24)
GFR, Estimated: 9 mL/min — ABNORMAL LOW (ref >60)
Glucose, Bld: 99 mg/dL (ref 70–99)
Phosphorus: 3.9 mg/dL (ref 2.5–4.6)
Potassium: 3.5 mmol/L (ref 3.5–5.1)
Sodium: 136 mmol/L (ref 135–145)

## 2023-12-09 LAB — GLUCOSE, CAPILLARY
Glucose-Capillary: 86 mg/dL (ref 70–99)
Glucose-Capillary: 76 mg/dL (ref 70–99)
Glucose-Capillary: 104 mg/dL — ABNORMAL HIGH (ref 70–99)

## 2023-12-09 MED ORDER — EPOETIN ALFA-EPBX 10000 UNIT/ML IJ SOLN
INTRAMUSCULAR | Status: AC
  Filled 2023-12-09: qty 1

## 2023-12-09 MED ORDER — ALTEPLASE 2 MG IJ SOLR: 2.0000 mg | Freq: Once | INTRAMUSCULAR | Status: DC | PRN

## 2023-12-09 MED ORDER — HEPARIN SODIUM (PORCINE) 1000 UNIT/ML DIALYSIS: 1000.0000 [IU] | INTRAMUSCULAR | Status: DC | PRN

## 2023-12-09 NOTE — Plan of Care (Signed)

## 2023-12-09 NOTE — Progress Notes (Signed)
  Received patient in bed to unit.   Informed consent signed and in chart.    TX duration:3.5 hrs      Transported back to floor  Hand-off given to patient's primary nurse. Pt c/o 3/10 abdominal pain refused medication primary nurse notified      Access used: R HD Cath Access issues: poor pull form arterial lines reversed for treatment     Total UF removed: 0 Medication(s) given: Retacrit  Post HD VS: WNL Post HD weight: 57.8kg     [facility]

## 2023-12-09 NOTE — Progress Notes (Signed)
 Progress Note   Patient: Casey Franco UJW:[id number] DOB: 1961/04/24 DOA: 11/26/2023     13 DOS: the patient was seen and examined on 12/09/2023   Brief hospital course: Casey Franco is a 63 y.o. 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  He also complained of nausea, increasing abdominal distention and multiple episodes of vomiting.    Spontaneous bacterial peritonitis Abdominal pain Liver cirrhosis with ascites: S/p paracentesis with removal of 1600 mL of fluid on 11/26/2023.   Fluid positive for SBP (ascitic fluid total nucleated cells 1,326, percentage neutrophils was 68% and absolute PMN count 901) Ascitic fluid culture showed Streptococcus mitis and Citrobacter amalonaticus S/p repeat paracentesis on 12/01/2023 with removal of 4.6 L of fluid. Percentage neutrophil count was 100 and total nucleated cell count 4,687. He was evaluated by the general surgeon.  No indication for surgery at this time.   Continue current antibiotics     Staph epidermidis bacteremia: 1 out of 4 bottles from 11/26/2023 positive for Staph epidermidis.  This is likely a contaminant.  Repeat blood cultures from 11/27/2023 has not shown any growth thus far.   Bacteroides  thetaiotaomicron bacteremia (blood cultures from 11/26/2023 growing gram-negative rods).   Continue metronidazole ID pharmacist agreed     Hematemesis: Resolved.  This is probably from multiple episodes of vomiting.   H&H is stable.     Elevated troponins: Troponin is 172, 138, 127 and 128.  No chest pain or acute EKG changes. Due to demand ischemia.     Paroxysmal atrial fibrillation, PVCs: Heart rate is better.  Continue metoprolol.  We discussed risks, benefits and alternatives to long-term anticoagulation for atrial fibrillation. He is at  increased risk for bleeding from long-term anticoagulation because of underlying severe liver and kidney disease. CHA2DS2-VASc score is 3 and he is also at increased risk for stroke. He wants to consider Eliquis for stroke prophylaxis. --Resume Eliquis when closer to discharge and clear no additional procedures needed     Chronic systolic CHF Moderate mitral regurgitation 2D echo showed EF estimated at 40 to 45%, moderate LVH, indeterminate left ventricular diastolic parameters, mildly reduced RV systolic function, moderately enlarged RV size, moderately dilated right atrium, small pericardial effusion, moderate mitral regurgitation --Volume management by dialysis   Confusion on 11/30/2023, likely acute toxic encephalopathy from oxycodone: Mental status is better today.  Oxycodone has been discontinued. --Delirium precautions     ESRD on hemodialysis MWF --Nephrology following for hemodialysis    Dysphagia: Speech therapist recommended dysphagia 2 diet, thin liquids.     General weakness: PT recommended discharge to SNF.   Follow-up with TOC to assist with placement.     Comorbidities include hypertension, hyperlipidemia, BPH, type II DM     Consultants: General surgery Interventional radiology   Procedures: Paracentesis with removal of 1,600 mL of fluid on 11/26/2023 Paracentesis with removal of 4.6 L of fluid on 12/01/2023   DVT prophylaxis: SCDs Start: 11/26/23 2029       Code Status: Full Code   Family Communication: None Disposition Plan:  SNF/rehab     Status is: Inpatient Remains inpatient appropriate because:  needs SNF placement - pending   Medically stable 12/05/23 for discharge.   Physical Exam: General exam: sleeping, wakes to voice, no acute distress Respiratory system: normal respiratory effort.on room air, lungs clear Cardiovascular system:  RRR, no edema, right upper chest perm cath in place Gastrointestinal system: soft, NT, non-distended Central  nervous system: no gross focal neurologic deficits Extremities: no edema, normal tone Psychiatry: normal mood, congruent affect   Subjective: Patient was seen while undergoing hemodialysis Denies nausea vomiting abdominal pain chest pain cough Vitals:   12/09/23 1200 12/09/23 1211 12/09/23 1225 12/09/23 1700  BP: 122/78  129/79 118/77  Pulse: 78  78 79  Resp: 14  18 18   Temp: 98.3 F (36.8 C)  98.2 F (36.8 C) 98.2 F (36.8 C)  TempSrc: Oral     SpO2: 100%  100% 100%  Weight:  57.8 kg    Height:        Data Reviewed:    Latest Ref Rng & Units 12/09/2023    4:28 AM 12/07/2023    6:08 AM 12/04/2023    5:47 AM  BMP  Glucose 70 - 99 mg/dL 99  96  79   BUN 8 - 23 mg/dL 28  38  47   Creatinine 0.61 - 1.24 mg/dL 1.19  1.47  8.29   Sodium 135 - 145 mmol/L 136  134  132   Potassium 3.5 - 5.1 mmol/L 3.5  3.4  3.4   Chloride 98 - 111 mmol/L 102  101  97   CO2 22 - 32 mmol/L 22  21  21    Calcium 8.9 - 10.3 mg/dL 7.8  7.8  7.6        Latest Ref Rng & Units 12/09/2023    4:28 AM 12/07/2023    8:07 AM 12/04/2023    5:47 AM  CBC  WBC 4.0 - 10.5 K/uL 5.1  5.7  7.4   Hemoglobin 13.0 - 17.0 g/dL 7.9  7.7  8.0   Hematocrit 39.0 - 52.0 % 23.8  23.8  23.5   Platelets 150 - 400 K/uL 136  136  149    Author: Loyce Dys, MD 12/09/2023 6:07 PM  For on call review [email or web address].

## 2023-12-09 NOTE — Progress Notes (Signed)
 [facility]  ROUNDING NOTE   Subjective:   Casey Franco is a 63 y.o. male with past medical history of HTN, ESRD, DM-2, and Hep C. Patient presents to emergency department with abdominal pain. He has been admitted for Intractable vomiting [R11.10] Abdominal pain [R10.9] Pneumonia of both lungs due to infectious organism, unspecified part of lung [J18.9] Sepsis without acute organ dysfunction, due to unspecified organism [facility]) [A41.9]  Patient is known to our practice and receives outpatient dialysis at [facility] on a MWF, last treatment on Tuesday.    Update Patient seen and evaluated during hemodialysis treatment this a.m. Tolerating well. No net UF to be performed today.   Objective:  Vital signs in last 24 hours:  Temp:  [98.3 F (36.8 C)-98.8 F (37.1 C)] 98.4 F (36.9 C) (02/26 0810) Pulse Rate:  [73-78] 73 (02/26 0821) Resp:  [12-19] 13 (02/26 0821) BP: (97-119)/(72-77) 118/77 (02/26 0821) SpO2:  [95 %-100 %] 95 % (02/26 0821) Weight:  [57.8 kg] 57.8 kg (02/26 0822)  Weight change: 1.3 kg Filed Weights   12/07/23 1120 12/08/23 0830 12/09/23 0822  Weight: 57.9 kg 59.2 kg 57.8 kg    Intake/Output: I/O last 3 completed shifts: In: 600 [P.O.:600] Out: -    Intake/Output this shift:  No intake/output data recorded.  Physical Exam: General: NAD  Head: Normocephalic, atraumatic. Moist oral mucosal membranes  Eyes: Anicteric  Lungs:  Clear to auscultation, normal effort  Heart: Regular rate and rhythm, aflutter  Abdomen:  Soft, tender, mild distension  Extremities: No peripheral edema.  Neurologic: Alert and oriented, moving all four extremities  Skin: No lesions  Access: Right chest tunneled catheter    Basic Metabolic Panel: Recent Labs  Lab 12/02/23 0905 12/04/23 0547 12/07/23 0608 12/09/23 0428  NA 132* 132* 134* 136  K 3.7 3.4* 3.4* 3.5  CL 97* 97* 101 102  CO2 18* 21* 21* 22  GLUCOSE 106* 79 96 99  BUN 72* 47* 38* 28*   CREATININE 7.29* 6.05* 6.89* 6.26*  CALCIUM 8.0* 7.6* 7.8* 7.8*  PHOS 5.1* 5.1* 4.3 3.9    Liver Function Tests: Recent Labs  Lab 12/02/23 0905 12/04/23 0547 12/07/23 0608 12/09/23 0428  ALBUMIN 1.9* 1.8* 1.8* 1.9*   No results for input(s): "LIPASE", "AMYLASE" in the last 168 hours.  No results for input(s): "AMMONIA" in the last 168 hours.   CBC: Recent Labs  Lab 12/02/23 0905 12/04/23 0547 12/07/23 0807 12/09/23 0428  WBC 8.9 7.4 5.7 5.1  HGB 8.2* 8.0* 7.7* 7.9*  HCT 24.1* 23.5* 23.8* 23.8*  MCV 90.3 87.7 92.6 89.8  PLT 140* 149* 136* 136*    Cardiac Enzymes: No results for input(s): "CKTOTAL", "CKMB", "CKMBINDEX", "TROPONINI" in the last 168 hours.  BNP: Invalid input(s): "POCBNP"  CBG: Recent Labs  Lab 12/05/23 0547 12/05/23 0746 12/06/23 0753 12/08/23 0903 12/09/23 0738  GLUCAP 81 73 88 123* 86    Microbiology: Results for orders placed or performed during the hospital encounter of 11/26/23  Culture, blood (Routine x 2)     Status: Abnormal   Collection Time: 11/26/23 11:22 AM   Specimen: BLOOD  Result Value Ref Range Status   Specimen Description   Final    BLOOD RIGHT ANTECUBITAL Performed at [facility], [address] [place], [place] [address]    Special Requests   Final    BOTTLES DRAWN AEROBIC AND ANAEROBIC Blood Culture results may not be optimal due to an inadequate volume of blood  received in culture bottles Performed at [facility], [address] [place], [place] [address]    Culture  Setup Time   Final    ANAEROBIC BOTTLE ONLY GRAM NEGATIVE RODS CRITICAL RESULT CALLED TO, READ BACK BY AND VERIFIED WITH: CAROLINE COULTER @ 11/28/23 2033 AB    Culture (A)  Final    BACTEROIDES THETAIOTAOMICRON BETA LACTAMASE POSITIVE Performed at [facility], [address], [place], [place] [address]    Report Status 12/01/2023 FINAL  Final  Blood Culture ID Panel (Reflexed)     Status: None   Collection  Time: 11/26/23 11:22 AM  Result Value Ref Range Status   Enterococcus faecalis NOT DETECTED NOT DETECTED Final   Enterococcus Faecium NOT DETECTED NOT DETECTED Final   Listeria monocytogenes NOT DETECTED NOT DETECTED Final   Staphylococcus species NOT DETECTED NOT DETECTED Final   Staphylococcus aureus (BCID) NOT DETECTED NOT DETECTED Final   Staphylococcus epidermidis NOT DETECTED NOT DETECTED Final   Staphylococcus lugdunensis NOT DETECTED NOT DETECTED Final   Streptococcus species NOT DETECTED NOT DETECTED Final   Streptococcus agalactiae NOT DETECTED NOT DETECTED Final   Streptococcus pneumoniae NOT DETECTED NOT DETECTED Final   Streptococcus pyogenes NOT DETECTED NOT DETECTED Final   A.calcoaceticus-baumannii NOT DETECTED NOT DETECTED Final   Bacteroides fragilis NOT DETECTED NOT DETECTED Final   Enterobacterales NOT DETECTED NOT DETECTED Final   Enterobacter cloacae complex NOT DETECTED NOT DETECTED Final   Escherichia coli NOT DETECTED NOT DETECTED Final   Klebsiella aerogenes NOT DETECTED NOT DETECTED Final   Klebsiella oxytoca NOT DETECTED NOT DETECTED Final   Klebsiella pneumoniae NOT DETECTED NOT DETECTED Final   Proteus species NOT DETECTED NOT DETECTED Final   Salmonella species NOT DETECTED NOT DETECTED Final   Serratia marcescens NOT DETECTED NOT DETECTED Final   Haemophilus influenzae NOT DETECTED NOT DETECTED Final   Neisseria meningitidis NOT DETECTED NOT DETECTED Final   Pseudomonas aeruginosa NOT DETECTED NOT DETECTED Final   Stenotrophomonas maltophilia NOT DETECTED NOT DETECTED Final   Candida albicans NOT DETECTED NOT DETECTED Final   Candida auris NOT DETECTED NOT DETECTED Final   Candida glabrata NOT DETECTED NOT DETECTED Final   Candida krusei NOT DETECTED NOT DETECTED Final   Candida parapsilosis NOT DETECTED NOT DETECTED Final   Candida tropicalis NOT DETECTED NOT DETECTED Final   Cryptococcus neoformans/gattii NOT DETECTED NOT DETECTED Final    Comment:  Performed at [facility], [address] [place], [place] [address]  Culture, blood (Routine x 2)     Status: Abnormal   Collection Time: 11/26/23 12:23 PM   Specimen: BLOOD LEFT ARM  Result Value Ref Range Status   Specimen Description   Final    BLOOD LEFT ARM Performed at [facility], [address] [place], [place] [address]    Special Requests   Final    BOTTLES DRAWN AEROBIC AND ANAEROBIC Blood Culture results may not be optimal due to an inadequate volume of blood received in culture bottles Performed at [facility], [address] [place], [place] [address]    Culture  Setup Time   Final    GRAM POSITIVE COCCI AEROBIC BOTTLE ONLY CRITICAL RESULT CALLED TO, READ BACK BY AND VERIFIED WITH: BRIANA ALLEY ON 11/27/23 AT 1107 QSD    Culture (A)  Final    STAPHYLOCOCCUS EPIDERMIDIS THE SIGNIFICANCE OF ISOLATING THIS ORGANISM FROM A SINGLE SET OF BLOOD CULTURES WHEN MULTIPLE SETS ARE DRAWN IS UNCERTAIN. PLEASE  NOTIFY THE MICROBIOLOGY DEPARTMENT WITHIN ONE WEEK IF SPECIATION AND SENSITIVITIES ARE REQUIRED. Performed at [facility], [address], [place], [place] [address]    Report Status 11/29/2023 FINAL  Final  Blood Culture ID Panel (Reflexed)     Status: Abnormal   Collection Time: 11/26/23 12:23 PM  Result Value Ref Range Status   Enterococcus faecalis NOT DETECTED NOT DETECTED Final   Enterococcus Faecium NOT DETECTED NOT DETECTED Final   Listeria monocytogenes NOT DETECTED NOT DETECTED Final   Staphylococcus species DETECTED (A) NOT DETECTED Final    Comment: CRITICAL RESULT CALLED TO, READ BACK BY AND VERIFIED WITH: BRIANA ALLEY ON 11/27/23 AT 1107 QSD    Staphylococcus aureus (BCID) NOT DETECTED NOT DETECTED Final   Staphylococcus epidermidis DETECTED (A) NOT DETECTED Final    Comment: CRITICAL RESULT CALLED TO, READ BACK BY AND VERIFIED WITH: BRIANA ALLEY ON 11/27/23 AT 1107 QSD    Staphylococcus lugdunensis NOT DETECTED NOT  DETECTED Final   Streptococcus species NOT DETECTED NOT DETECTED Final   Streptococcus agalactiae NOT DETECTED NOT DETECTED Final   Streptococcus pneumoniae NOT DETECTED NOT DETECTED Final   Streptococcus pyogenes NOT DETECTED NOT DETECTED Final   A.calcoaceticus-baumannii NOT DETECTED NOT DETECTED Final   Bacteroides fragilis NOT DETECTED NOT DETECTED Final   Enterobacterales NOT DETECTED NOT DETECTED Final   Enterobacter cloacae complex NOT DETECTED NOT DETECTED Final   Escherichia coli NOT DETECTED NOT DETECTED Final   Klebsiella aerogenes NOT DETECTED NOT DETECTED Final   Klebsiella oxytoca NOT DETECTED NOT DETECTED Final   Klebsiella pneumoniae NOT DETECTED NOT DETECTED Final   Proteus species NOT DETECTED NOT DETECTED Final   Salmonella species NOT DETECTED NOT DETECTED Final   Serratia marcescens NOT DETECTED NOT DETECTED Final   Haemophilus influenzae NOT DETECTED NOT DETECTED Final   Neisseria meningitidis NOT DETECTED NOT DETECTED Final   Pseudomonas aeruginosa NOT DETECTED NOT DETECTED Final   Stenotrophomonas maltophilia NOT DETECTED NOT DETECTED Final   Candida albicans NOT DETECTED NOT DETECTED Final   Candida auris NOT DETECTED NOT DETECTED Final   Candida glabrata NOT DETECTED NOT DETECTED Final   Candida krusei NOT DETECTED NOT DETECTED Final   Candida parapsilosis NOT DETECTED NOT DETECTED Final   Candida tropicalis NOT DETECTED NOT DETECTED Final   Cryptococcus neoformans/gattii NOT DETECTED NOT DETECTED Final   Methicillin resistance mecA/C NOT DETECTED NOT DETECTED Final    Comment: Performed at [facility], [address] [place], [place] [address]  Body fluid culture w Gram Stain     Status: None   Collection Time: 11/26/23  8:55 PM   Specimen: PATH Cytology Peritoneal fluid  Result Value Ref Range Status   Specimen Description   Final    PERITONEAL CYTO Performed at [facility], [address], [place], [place] [address]    Special  Requests   Final    NONE Performed at [facility], [address] [place], [place] [address]    Gram Stain   Final    FEW WBC PRESENT,BOTH PMN AND MONONUCLEAR FEW GRAM POSITIVE COCCI IN CHAINS MODERATE GRAM NEGATIVE RODS FEW GRAM POSITIVE RODS Performed at [facility], [address], [place], [place] [address]    Culture   Final    MODERATE STREPTOCOCCUS MITIS/ORALIS RARE CITROBACTER AMALONATICUS    Report Status 11/30/2023 FINAL  Final   Organism ID, Bacteria STREPTOCOCCUS MITIS/ORALIS  Final   Organism ID, Bacteria CITROBACTER AMALONATICUS  Final  Susceptibility   Citrobacter amalonaticus - MIC*    CEFEPIME <=0.12 SENSITIVE Sensitive     CEFTAZIDIME <=1 SENSITIVE Sensitive     CEFTRIAXONE <=0.25 SENSITIVE Sensitive     CIPROFLOXACIN <=0.25 SENSITIVE Sensitive     GENTAMICIN <=1 SENSITIVE Sensitive     IMIPENEM <=0.25 SENSITIVE Sensitive     TRIMETH/SULFA <=20 SENSITIVE Sensitive     PIP/TAZO <=4 SENSITIVE Sensitive ug/mL    * RARE CITROBACTER AMALONATICUS   Streptococcus mitis/oralis - MIC*    PENICILLIN 0.25 INTERMEDIATE Intermediate     CEFTRIAXONE 0.25 SENSITIVE Sensitive     LEVOFLOXACIN 1 SENSITIVE Sensitive     VANCOMYCIN 0.5 SENSITIVE Sensitive     * MODERATE STREPTOCOCCUS MITIS/ORALIS  Culture, blood (Routine X 2) w Reflex to ID Panel     Status: None   Collection Time: 11/28/23  5:43 AM   Specimen: BLOOD LEFT ARM  Result Value Ref Range Status   Specimen Description BLOOD LEFT ARM  Final   Special Requests   Final    BOTTLES DRAWN AEROBIC AND ANAEROBIC Blood Culture results may not be optimal due to an inadequate volume of blood received in culture bottles   Culture   Final    NO GROWTH 5 DAYS Performed at [facility], [address] [place], [place] [address]    Report Status 12/03/2023 FINAL  Final  Culture, blood (Routine X 2) w Reflex to ID Panel     Status: None   Collection Time: 11/28/23  5:47 AM   Specimen: BLOOD  RIGHT HAND  Result Value Ref Range Status   Specimen Description BLOOD RIGHT HAND  Final   Special Requests   Final    BOTTLES DRAWN AEROBIC AND ANAEROBIC Blood Culture results may not be optimal due to an inadequate volume of blood received in culture bottles   Culture   Final    NO GROWTH 5 DAYS Performed at [facility], [address] [place], [place] [address]    Report Status 12/03/2023 FINAL  Final  Body fluid culture w Gram Stain     Status: None   Collection Time: 12/01/23 10:10 AM   Specimen: Ascitic; Body Fluid  Result Value Ref Range Status   Specimen Description   Final    ASCITIC Performed at [facility], [address] [place], [place] [address]    Special Requests   Final    peri Performed at [facility], [address] [place], [place] [address]    Gram Stain   Final    FEW WBC PRESENT, PREDOMINANTLY PMN ABUNDANT GRAM NEGATIVE RODS ABUNDANT GRAM POSITIVE COCCI RARE GRAM POSITIVE RODS    Culture   Final    FEW STREPTOCOCCUS MITIS/ORALIS RARE CITROBACTER AMALONATICUS MODERATE BACTEROIDES THETAIOTAOMICRON BETA LACTAMASE POSITIVE RARE EIKENELLA CORRODENS Usually susceptible to penicillin and other beta lactam agents,quinolones,macrolides and tetracyclines. Performed at [facility], [address] [place], [place] [address]    Report Status 12/06/2023 FINAL  Final   Organism ID, Bacteria STREPTOCOCCUS MITIS/ORALIS  Final   Organism ID, Bacteria CITROBACTER AMALONATICUS  Final      Susceptibility   Citrobacter amalonaticus - MIC*    CEFEPIME <=0.12 SENSITIVE Sensitive     CEFTAZIDIME <=1 SENSITIVE Sensitive     CEFTRIAXONE <=0.25 SENSITIVE Sensitive     CIPROFLOXACIN <=0.25 SENSITIVE Sensitive     GENTAMICIN <=1 SENSITIVE Sensitive     IMIPENEM <=0.25 SENSITIVE Sensitive     TRIMETH/SULFA <=20 SENSITIVE Sensitive  PIP/TAZO <=4 SENSITIVE Sensitive ug/mL    * RARE CITROBACTER AMALONATICUS   Streptococcus  mitis/oralis - MIC*    PENICILLIN <=0.06 SENSITIVE Sensitive     CEFTRIAXONE 0.25 SENSITIVE Sensitive     LEVOFLOXACIN 1 SENSITIVE Sensitive     VANCOMYCIN 0.5 SENSITIVE Sensitive     * FEW STREPTOCOCCUS MITIS/ORALIS    Coagulation Studies: No results for input(s): "LABPROT", "INR" in the last 72 hours.   Urinalysis: No results for input(s): "COLORURINE", "LABSPEC", "PHURINE", "GLUCOSEU", "HGBUR", "BILIRUBINUR", "KETONESUR", "PROTEINUR", "UROBILINOGEN", "NITRITE", "LEUKOCYTESUR" in the last 72 hours.  Invalid input(s): "APPERANCEUR"    Imaging: DG Abd 1 View Result Date: 12/07/2023 CLINICAL DATA:  Abdominal pain, nausea/vomiting after dialysis EXAM: ABDOMEN - 1 VIEW COMPARISON:  None Available. FINDINGS: Nonobstructive bowel gas pattern. Visualized osseous structures are within normal limits. Vascular calcifications. IMPRESSION: Negative. Electronically Signed   By: Charline Bills M.D.   On: 12/07/2023 20:10       Medications:      amLODipine  10 mg Oral Daily   Chlorhexidine Gluconate Cloth  6 each Topical Q0600   epoetin alfa-epbx (RETACRIT) injection  10,000 Units Intravenous Q M,W,F-HD   feeding supplement (NEPRO CARB STEADY)  237 mL Oral BID BM   irbesartan  300 mg Oral Daily   lactulose  20 g Oral BID   levofloxacin  250 mg Oral Daily   metoprolol tartrate  25 mg Oral BID   metroNIDAZOLE  500 mg Oral Q12H   pantoprazole  40 mg Oral Daily   tamsulosin  0.4 mg Oral Daily   acetaminophen, alum & mag hydroxide-simeth, antiseptic oral rinse, hydrALAZINE, hydrOXYzine, ondansetron (ZOFRAN) IV  Assessment/ Plan:  Casey Franco is a 63 y.o.  male with HTN, ESRD, DM-2, Hep C.  Patient presents with chest pain and has been admitted for Intractable vomiting [R11.10] Abdominal pain [R10.9] Pneumonia of both lungs due to infectious organism, unspecified part of lung [J18.9] Sepsis without acute organ dysfunction, due to unspecified organism [facility]) [A41.9]  [facility]  [facility]/MWF/right chest PermCath/71.0 kg  End-stage renal disease on hemodialysis.  Patient seen during hemodialysis treatment today.  He is tolerating the treatment well.  UF target 0 kg. 2. Anemia of chronic kidney disease Lab Results  Component Value Date   HGB 7.9 (L) 12/09/2023    Hemoglobin still below target at 7.9.  Continue Epogen 10,000's IV with dialysis treatments.  3. Secondary Hyperparathyroidism: with outpatient labs: PTH 351, phosphorus 6.6, calcium 8.1 on 11/23/2023.   Lab Results  Component Value Date   PTH 31 08/30/2018   CALCIUM 7.8 (L) 12/09/2023   PHOS 3.9 12/09/2023    Phosphorus acceptable at 3.9.  Continue to monitor bone metabolism parameters.  4.  Hypertension with chronic kidney disease.  Home regimen includes furosemide, hydralazine, amlodipine, and irbesartan. Maintain the patient on amlodipine, Avapro, and metoprolol.  5.  Cirrhosis/ascites, spontaneous bacterial peritonitis. Paracentesis from 11/27/2023 yielded 1.6 L of fluid.  Complex loculations suspected as more ascites present. Microbiology results show Staph epidermidis in the blood. Peritoneal fluid culture from 11/26/2023 shows Streptococcus and Citrobacter. Lactulose twice a day. Paracentesis on 12/02/23 with 4.6L removed. Fluid cultures negative  Patient maintained on levofloxacin, lactulose, and Flagyl.  LOS: 13 Saloni Lablanc 2/26/20258:40 AM

## 2023-12-09 NOTE — Progress Notes (Signed)
 PT Cancellation Note  Patient Details Name: Casey Franco MRN: [id number] DOB: 03/23/1961   Cancelled Treatment:    Reason Eval/Treat Not Completed: Other (comment). Pt out of room for HD at this time. Will re-attempt another date.   Kaileigh Viswanathan 12/09/2023, 8:55 AM Elizabeth Palau, PT, DPT, GCS [phone]

## 2023-12-10 DIAGNOSIS — K652 Spontaneous bacterial peritonitis: Secondary | ICD-10-CM | POA: Diagnosis not present

## 2023-12-10 LAB — CBC WITH DIFFERENTIAL/PLATELET
Abs Immature Granulocytes: 0.03 10*3/uL (ref 0.00–0.07)
Basophils Absolute: 0 10*3/uL (ref 0.0–0.1)
Basophils Relative: 1 %
Eosinophils Absolute: 0 10*3/uL (ref 0.0–0.5)
Eosinophils Relative: 1 %
HCT: 24.3 % — ABNORMAL LOW (ref 39.0–52.0)
Hemoglobin: 7.9 g/dL — ABNORMAL LOW (ref 13.0–17.0)
Immature Granulocytes: 1 %
Lymphocytes Relative: 16 %
Lymphs Abs: 0.7 10*3/uL (ref 0.7–4.0)
MCH: 29.7 pg (ref 26.0–34.0)
MCHC: 32.5 g/dL (ref 30.0–36.0)
MCV: 91.4 fL (ref 80.0–100.0)
Monocytes Absolute: 1.1 10*3/uL — ABNORMAL HIGH (ref 0.1–1.0)
Monocytes Relative: 25 %
Neutro Abs: 2.5 10*3/uL (ref 1.7–7.7)
Neutrophils Relative %: 56 %
Platelets: 137 10*3/uL — ABNORMAL LOW (ref 150–400)
RBC: 2.66 MIL/uL — ABNORMAL LOW (ref 4.22–5.81)
RDW: 16.8 % — ABNORMAL HIGH (ref 11.5–15.5)
WBC: 4.4 10*3/uL (ref 4.0–10.5)
nRBC: 0 % (ref 0.0–0.2)

## 2023-12-10 LAB — BASIC METABOLIC PANEL
Anion gap: 10 (ref 5–15)
BUN: 15 mg/dL (ref 8–23)
CO2: 23 mmol/L (ref 22–32)
Calcium: 7.8 mg/dL — ABNORMAL LOW (ref 8.9–10.3)
Chloride: 99 mmol/L (ref 98–111)
Creatinine, Ser: 4.44 mg/dL — ABNORMAL HIGH (ref 0.61–1.24)
GFR, Estimated: 14 mL/min — ABNORMAL LOW (ref >60)
Glucose, Bld: 120 mg/dL — ABNORMAL HIGH (ref 70–99)
Potassium: 3.6 mmol/L (ref 3.5–5.1)
Sodium: 132 mmol/L — ABNORMAL LOW (ref 135–145)

## 2023-12-10 NOTE — Progress Notes (Signed)
 Progress Note   Patient: Casey Franco DOB: [birth year] DOA: 11/26/2023     14 DOS: the patient was seen and examined on 12/10/2023     Brief hospital course: Casey Franco is a 63 y.o. 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  He also complained of nausea, increasing abdominal distention and multiple episodes of vomiting.    Spontaneous bacterial peritonitis Abdominal pain Liver cirrhosis with ascites: S/p paracentesis with removal of 1600 mL of fluid on 11/26/2023.   Fluid positive for SBP (ascitic fluid total nucleated cells 1,326, percentage neutrophils was 68% and absolute PMN count 901) Ascitic fluid culture showed Streptococcus mitis and Citrobacter amalonaticus S/p repeat paracentesis on 12/01/2023 with removal of 4.6 L of fluid. Percentage neutrophil count was 100 and total nucleated cell count 4,687. He was evaluated by the general surgeon.  No indication for surgery at this time.   Continue current antibiotics     Staph epidermidis bacteremia: 1 out of 4 bottles from 11/26/2023 positive for Staph epidermidis.  This is likely a contaminant.  Repeat blood cultures from 11/27/2023 has not shown any growth thus far.   Bacteroides  thetaiotaomicron bacteremia (blood cultures from 11/26/2023 growing gram-negative rods).   Continue metronidazole ID pharmacist agreed     Hematemesis: Resolved.  This is probably from multiple episodes of vomiting.   H&H is stable.     Elevated troponins: Troponin is 172, 138, 127 and 128.  No chest pain or acute EKG changes. Due to demand ischemia.     Paroxysmal atrial fibrillation, PVCs: Heart rate is better.  Continue metoprolol.  We discussed risks, benefits and alternatives to long-term anticoagulation for atrial fibrillation. He is at  increased risk for bleeding from long-term anticoagulation because of underlying severe liver and kidney disease. CHA2DS2-VASc score is 3 and he is also at increased risk for stroke. He wants to consider Eliquis for stroke prophylaxis. --Resume Eliquis when closer to discharge and clear no additional procedures needed     Chronic systolic CHF Moderate mitral regurgitation 2D echo showed EF estimated at 40 to 45%, moderate LVH, indeterminate left ventricular diastolic parameters, mildly reduced RV systolic function, moderately enlarged RV size, moderately dilated right atrium, small pericardial effusion, moderate mitral regurgitation --Volume management by dialysis   Confusion on 11/30/2023, likely acute toxic encephalopathy from oxycodone: Mental status is better today.  Oxycodone has been discontinued. Continue delirium precaution     ESRD on hemodialysis MWF --Nephrology following for hemodialysis    Dysphagia: Speech therapist recommended dysphagia 2 diet, thin liquids.     General weakness: PT recommended discharge to SNF.   Follow-up with TOC to assist with placement.     Comorbidities include hypertension, hyperlipidemia, BPH, type II DM     Consultants: General surgery Interventional radiology   Procedures: Paracentesis with removal of 1,600 mL of fluid on 11/26/2023 Paracentesis with removal of 4.6 L of fluid on 12/01/2023    DVT prophylaxis: SCDs Start: 11/26/23 2029       Code Status: Full Code   Family Communication: None Disposition Plan:  SNF/rehab     Status is: Inpatient Remains inpatient appropriate because:  needs SNF placement - pending   Medically stable 12/05/23 for discharge.    Physical Exam: General exam: sleeping, wakes to voice, no acute distress Respiratory system: normal respiratory effort.on room  air, lungs clear Cardiovascular system: RRR, no edema, right upper chest perm cath in place Gastrointestinal system: soft, NT,  non-distended Central nervous system: no gross focal neurologic deficits Extremities: no edema, normal tone Psychiatry: normal mood, congruent affect     Subjective: Patient was seen while undergoing hemodialysis Denies nausea vomiting abdominal pain chest pain cough  Subjective:  Patient seen and examined at bedside this morning Denies nausea vomiting abdominal pain chest pain cough  Physical Exam: Vitals:   12/10/23 0346 12/10/23 0700 12/10/23 1100 12/10/23 1204  BP: 99/69 100/64 104/70 104/69  Pulse: 78   78  Resp: 18 18 18    Temp: 98.7 F (37.1 C) 98.9 F (37.2 C) 98.5 F (36.9 C) 98.2 F (36.8 C)  TempSrc: Oral Oral Oral Oral  SpO2: 95% 97%  99%  Weight:      Height:          Latest Ref Rng & Units 12/10/2023    4:43 AM 12/09/2023    4:28 AM 12/07/2023    8:07 AM  CBC  WBC 4.0 - 10.5 K/uL 4.4  5.1  5.7   Hemoglobin 13.0 - 17.0 g/dL 7.9  7.9  7.7   Hematocrit 39.0 - 52.0 % 24.3  23.8  23.8   Platelets 150 - 400 K/uL 137  136  136        Latest Ref Rng & Units 12/10/2023    4:43 AM 12/09/2023    4:28 AM 12/07/2023    6:08 AM  BMP  Glucose 70 - 99 mg/dL 528  99  96   BUN 8 - 23 mg/dL 15  28  38   Creatinine 0.61 - 1.24 mg/dL 4.13  2.44  0.10   Sodium 135 - 145 mmol/L 132  136  134   Potassium 3.5 - 5.1 mmol/L 3.6  3.5  3.4   Chloride 98 - 111 mmol/L 99  102  101   CO2 22 - 32 mmol/L 23  22  21    Calcium 8.9 - 10.3 mg/dL 7.8  7.8  7.8      Author: Loyce Dys, MD 12/10/2023 5:39 PM  For on call review [email or web address].

## 2023-12-10 NOTE — Progress Notes (Signed)
 Patient continues to have several loose stools throughout the evening. Completed bed bath x 4.

## 2023-12-10 NOTE — Plan of Care (Signed)
  Problem: Education: Goal: Knowledge of General Education information will improve Description: Including pain rating scale, medication(s)/side effects and non-pharmacologic comfort measures Outcome: Progressing   Problem: Elimination: Goal: Will not experience complications related to bowel motility Outcome: Progressing   Problem: Elimination: Goal: Will not experience complications related to urinary retention Outcome: Progressing   Problem: Skin Integrity: Goal: Risk for impaired skin integrity will decrease Outcome: Progressing

## 2023-12-10 NOTE — Progress Notes (Signed)
 [facility]  ROUNDING NOTE   Subjective:   Casey Franco is a 63 y.o. male with past medical history of HTN, ESRD, DM-2, and Hep C. Patient presents to emergency department with abdominal pain. He has been admitted for Intractable vomiting [R11.10] Abdominal pain [R10.9] Pneumonia of both lungs due to infectious organism, unspecified part of lung [J18.9] Sepsis without acute organ dysfunction, due to unspecified organism [facility]) [A41.9]  Patient is known to our practice and receives outpatient dialysis at [facility] on a MWF, last treatment on Tuesday.    Update Patient seen resting in bed Breakfast tray at bedside Denies any pain or discomfort Remains on room air, denies shortness of breath   Objective:  Vital signs in last 24 hours:  Temp:  [98.2 F (36.8 C)-99.9 F (37.7 C)] 98.2 F (36.8 C) (02/27 1204) Pulse Rate:  [78-80] 78 (02/27 1204) Resp:  [16-18] 18 (02/27 1100) BP: (99-118)/(64-77) 104/69 (02/27 1204) SpO2:  [95 %-100 %] 99 % (02/27 1204)  Weight change: -1.4 kg Filed Weights   12/08/23 0830 12/09/23 0822 12/09/23 1211  Weight: 59.2 kg 57.8 kg 57.8 kg    Intake/Output: I/O last 3 completed shifts: In: 480 [P.O.:480] Out: 0    Intake/Output this shift:  No intake/output data recorded.  Physical Exam: General: NAD  Head: Normocephalic, atraumatic. Moist oral mucosal membranes  Eyes: Anicteric  Lungs:  Clear to auscultation, normal effort  Heart: Regular rate and rhythm, aflutter  Abdomen:  Soft, tender, mild distension  Extremities: No peripheral edema.  Neurologic: Alert and oriented, moving all four extremities  Skin: No lesions  Access: Right chest tunneled catheter    Basic Metabolic Panel: Recent Labs  Lab 12/04/23 0547 12/07/23 0608 12/09/23 0428 12/10/23 0443  NA 132* 134* 136 132*  K 3.4* 3.4* 3.5 3.6  CL 97* 101 102 99  CO2 21* 21* 22 23  GLUCOSE 79 96 99 120*  BUN 47* 38* 28* 15  CREATININE 6.05* 6.89*  6.26* 4.44*  CALCIUM 7.6* 7.8* 7.8* 7.8*  PHOS 5.1* 4.3 3.9  --     Liver Function Tests: Recent Labs  Lab 12/04/23 0547 12/07/23 0608 12/09/23 0428  ALBUMIN 1.8* 1.8* 1.9*   No results for input(s): "LIPASE", "AMYLASE" in the last 168 hours.  No results for input(s): "AMMONIA" in the last 168 hours.   CBC: Recent Labs  Lab 12/04/23 0547 12/07/23 0807 12/09/23 0428 12/10/23 0443  WBC 7.4 5.7 5.1 4.4  NEUTROABS  --   --   --  2.5  HGB 8.0* 7.7* 7.9* 7.9*  HCT 23.5* 23.8* 23.8* 24.3*  MCV 87.7 92.6 89.8 91.4  PLT 149* 136* 136* 137*    Cardiac Enzymes: No results for input(s): "CKTOTAL", "CKMB", "CKMBINDEX", "TROPONINI" in the last 168 hours.  BNP: Invalid input(s): "POCBNP"  CBG: Recent Labs  Lab 12/06/23 0753 12/08/23 0903 12/09/23 0738 12/09/23 1230 12/09/23 1702  GLUCAP 88 123* 86 76 104*    Microbiology: Results for orders placed or performed during the hospital encounter of 11/26/23  Culture, blood (Routine x 2)     Status: Abnormal   Collection Time: 11/26/23 11:22 AM   Specimen: BLOOD  Result Value Ref Range Status   Specimen Description   Final    BLOOD RIGHT ANTECUBITAL Performed at [facility], [address] [place] [address] [place], [place] [address]    Special Requests   Final    BOTTLES DRAWN AEROBIC AND ANAEROBIC Blood Culture results may not be optimal  due to an inadequate volume of blood received in culture bottles Performed at [facility], [address] [place], [place] [address]    Culture  Setup Time   Final    ANAEROBIC BOTTLE ONLY GRAM NEGATIVE RODS CRITICAL RESULT CALLED TO, READ BACK BY AND VERIFIED WITH: CAROLINE COULTER @ 11/28/23 2033 AB    Culture (A)  Final    BACTEROIDES THETAIOTAOMICRON BETA LACTAMASE POSITIVE Performed at [facility], [address], [place], [place] [address]    Report Status 12/01/2023 FINAL  Final  Blood Culture ID Panel (Reflexed)     Status: None   Collection Time: 11/26/23  11:22 AM  Result Value Ref Range Status   Enterococcus faecalis NOT DETECTED NOT DETECTED Final   Enterococcus Faecium NOT DETECTED NOT DETECTED Final   Listeria monocytogenes NOT DETECTED NOT DETECTED Final   Staphylococcus species NOT DETECTED NOT DETECTED Final   Staphylococcus aureus (BCID) NOT DETECTED NOT DETECTED Final   Staphylococcus epidermidis NOT DETECTED NOT DETECTED Final   Staphylococcus lugdunensis NOT DETECTED NOT DETECTED Final   Streptococcus species NOT DETECTED NOT DETECTED Final   Streptococcus agalactiae NOT DETECTED NOT DETECTED Final   Streptococcus pneumoniae NOT DETECTED NOT DETECTED Final   Streptococcus pyogenes NOT DETECTED NOT DETECTED Final   A.calcoaceticus-baumannii NOT DETECTED NOT DETECTED Final   Bacteroides fragilis NOT DETECTED NOT DETECTED Final   Enterobacterales NOT DETECTED NOT DETECTED Final   Enterobacter cloacae complex NOT DETECTED NOT DETECTED Final   Escherichia coli NOT DETECTED NOT DETECTED Final   Klebsiella aerogenes NOT DETECTED NOT DETECTED Final   Klebsiella oxytoca NOT DETECTED NOT DETECTED Final   Klebsiella pneumoniae NOT DETECTED NOT DETECTED Final   Proteus species NOT DETECTED NOT DETECTED Final   Salmonella species NOT DETECTED NOT DETECTED Final   Serratia marcescens NOT DETECTED NOT DETECTED Final   Haemophilus influenzae NOT DETECTED NOT DETECTED Final   Neisseria meningitidis NOT DETECTED NOT DETECTED Final   Pseudomonas aeruginosa NOT DETECTED NOT DETECTED Final   Stenotrophomonas maltophilia NOT DETECTED NOT DETECTED Final   Candida albicans NOT DETECTED NOT DETECTED Final   Candida auris NOT DETECTED NOT DETECTED Final   Candida glabrata NOT DETECTED NOT DETECTED Final   Candida krusei NOT DETECTED NOT DETECTED Final   Candida parapsilosis NOT DETECTED NOT DETECTED Final   Candida tropicalis NOT DETECTED NOT DETECTED Final   Cryptococcus neoformans/gattii NOT DETECTED NOT DETECTED Final    Comment: Performed at  [facility], [address] [place], [place] [address]  Culture, blood (Routine x 2)     Status: Abnormal   Collection Time: 11/26/23 12:23 PM   Specimen: BLOOD LEFT ARM  Result Value Ref Range Status   Specimen Description   Final    BLOOD LEFT ARM Performed at [facility], [address] [place], [place] [address]    Special Requests   Final    BOTTLES DRAWN AEROBIC AND ANAEROBIC Blood Culture results may not be optimal due to an inadequate volume of blood received in culture bottles Performed at [facility], [address] [place], [place] [address]    Culture  Setup Time   Final    GRAM POSITIVE COCCI AEROBIC BOTTLE ONLY CRITICAL RESULT CALLED TO, READ BACK BY AND VERIFIED WITH: BRIANA ALLEY ON 11/27/23 AT 1107 QSD    Culture (A)  Final    STAPHYLOCOCCUS EPIDERMIDIS THE SIGNIFICANCE OF ISOLATING THIS ORGANISM FROM A SINGLE SET OF BLOOD CULTURES WHEN  MULTIPLE SETS ARE DRAWN IS UNCERTAIN. PLEASE NOTIFY THE MICROBIOLOGY DEPARTMENT WITHIN ONE WEEK IF SPECIATION AND SENSITIVITIES ARE REQUIRED. Performed at [facility], [address], [place], [place] [address]    Report Status 11/29/2023 FINAL  Final  Blood Culture ID Panel (Reflexed)     Status: Abnormal   Collection Time: 11/26/23 12:23 PM  Result Value Ref Range Status   Enterococcus faecalis NOT DETECTED NOT DETECTED Final   Enterococcus Faecium NOT DETECTED NOT DETECTED Final   Listeria monocytogenes NOT DETECTED NOT DETECTED Final   Staphylococcus species DETECTED (A) NOT DETECTED Final    Comment: CRITICAL RESULT CALLED TO, READ BACK BY AND VERIFIED WITH: BRIANA ALLEY ON 11/27/23 AT 1107 QSD    Staphylococcus aureus (BCID) NOT DETECTED NOT DETECTED Final   Staphylococcus epidermidis DETECTED (A) NOT DETECTED Final    Comment: CRITICAL RESULT CALLED TO, READ BACK BY AND VERIFIED WITH: BRIANA ALLEY ON 11/27/23 AT 1107 QSD    Staphylococcus lugdunensis NOT DETECTED NOT DETECTED Final    Streptococcus species NOT DETECTED NOT DETECTED Final   Streptococcus agalactiae NOT DETECTED NOT DETECTED Final   Streptococcus pneumoniae NOT DETECTED NOT DETECTED Final   Streptococcus pyogenes NOT DETECTED NOT DETECTED Final   A.calcoaceticus-baumannii NOT DETECTED NOT DETECTED Final   Bacteroides fragilis NOT DETECTED NOT DETECTED Final   Enterobacterales NOT DETECTED NOT DETECTED Final   Enterobacter cloacae complex NOT DETECTED NOT DETECTED Final   Escherichia coli NOT DETECTED NOT DETECTED Final   Klebsiella aerogenes NOT DETECTED NOT DETECTED Final   Klebsiella oxytoca NOT DETECTED NOT DETECTED Final   Klebsiella pneumoniae NOT DETECTED NOT DETECTED Final   Proteus species NOT DETECTED NOT DETECTED Final   Salmonella species NOT DETECTED NOT DETECTED Final   Serratia marcescens NOT DETECTED NOT DETECTED Final   Haemophilus influenzae NOT DETECTED NOT DETECTED Final   Neisseria meningitidis NOT DETECTED NOT DETECTED Final   Pseudomonas aeruginosa NOT DETECTED NOT DETECTED Final   Stenotrophomonas maltophilia NOT DETECTED NOT DETECTED Final   Candida albicans NOT DETECTED NOT DETECTED Final   Candida auris NOT DETECTED NOT DETECTED Final   Candida glabrata NOT DETECTED NOT DETECTED Final   Candida krusei NOT DETECTED NOT DETECTED Final   Candida parapsilosis NOT DETECTED NOT DETECTED Final   Candida tropicalis NOT DETECTED NOT DETECTED Final   Cryptococcus neoformans/gattii NOT DETECTED NOT DETECTED Final   Methicillin resistance mecA/C NOT DETECTED NOT DETECTED Final    Comment: Performed at [facility], [address] [place], [place] [address]  Body fluid culture w Gram Stain     Status: None   Collection Time: 11/26/23  8:55 PM   Specimen: PATH Cytology Peritoneal fluid  Result Value Ref Range Status   Specimen Description   Final    PERITONEAL CYTO Performed at [facility], [address], [place], [place] [address]    Special Requests   Final     NONE Performed at [facility], [address] [place], [place] [address]    Gram Stain   Final    FEW WBC PRESENT,BOTH PMN AND MONONUCLEAR FEW GRAM POSITIVE COCCI IN CHAINS MODERATE GRAM NEGATIVE RODS FEW GRAM POSITIVE RODS Performed at [facility], [address] [place], [place] [address]    Culture   Final    MODERATE STREPTOCOCCUS MITIS/ORALIS RARE CITROBACTER AMALONATICUS    Report Status 11/30/2023 FINAL  Final   Organism ID, Bacteria STREPTOCOCCUS MITIS/ORALIS  Final   Organism ID,  Bacteria CITROBACTER AMALONATICUS  Final      Susceptibility   Citrobacter amalonaticus - MIC*    CEFEPIME <=0.12 SENSITIVE Sensitive     CEFTAZIDIME <=1 SENSITIVE Sensitive     CEFTRIAXONE <=0.25 SENSITIVE Sensitive     CIPROFLOXACIN <=0.25 SENSITIVE Sensitive     GENTAMICIN <=1 SENSITIVE Sensitive     IMIPENEM <=0.25 SENSITIVE Sensitive     TRIMETH/SULFA <=20 SENSITIVE Sensitive     PIP/TAZO <=4 SENSITIVE Sensitive ug/mL    * RARE CITROBACTER AMALONATICUS   Streptococcus mitis/oralis - MIC*    PENICILLIN 0.25 INTERMEDIATE Intermediate     CEFTRIAXONE 0.25 SENSITIVE Sensitive     LEVOFLOXACIN 1 SENSITIVE Sensitive     VANCOMYCIN 0.5 SENSITIVE Sensitive     * MODERATE STREPTOCOCCUS MITIS/ORALIS  Culture, blood (Routine X 2) w Reflex to ID Panel     Status: None   Collection Time: 11/28/23  5:43 AM   Specimen: BLOOD LEFT ARM  Result Value Ref Range Status   Specimen Description BLOOD LEFT ARM  Final   Special Requests   Final    BOTTLES DRAWN AEROBIC AND ANAEROBIC Blood Culture results may not be optimal due to an inadequate volume of blood received in culture bottles   Culture   Final    NO GROWTH 5 DAYS Performed at [facility], [address] [place], [place] [address]    Report Status 12/03/2023 FINAL  Final  Culture, blood (Routine X 2) w Reflex to ID Panel     Status: None   Collection Time: 11/28/23  5:47 AM   Specimen: BLOOD RIGHT HAND  Result  Value Ref Range Status   Specimen Description BLOOD RIGHT HAND  Final   Special Requests   Final    BOTTLES DRAWN AEROBIC AND ANAEROBIC Blood Culture results may not be optimal due to an inadequate volume of blood received in culture bottles   Culture   Final    NO GROWTH 5 DAYS Performed at [facility], [address], [place], [place] [address]    Report Status 12/03/2023 FINAL  Final  Body fluid culture w Gram Stain     Status: None   Collection Time: 12/01/23 10:10 AM   Specimen: Ascitic; Body Fluid  Result Value Ref Range Status   Specimen Description   Final    ASCITIC Performed at [facility], [address] [place], [place] [address]    Special Requests   Final    peri Performed at [facility], [address] [place], [place] [address]    Gram Stain   Final    FEW WBC PRESENT, PREDOMINANTLY PMN ABUNDANT GRAM NEGATIVE RODS ABUNDANT GRAM POSITIVE COCCI RARE GRAM POSITIVE RODS    Culture   Final    FEW STREPTOCOCCUS MITIS/ORALIS RARE CITROBACTER AMALONATICUS MODERATE BACTEROIDES THETAIOTAOMICRON BETA LACTAMASE POSITIVE RARE EIKENELLA CORRODENS Usually susceptible to penicillin and other beta lactam agents,quinolones,macrolides and tetracyclines. Performed at [facility], [address] [place], [place] [address]    Report Status 12/06/2023 FINAL  Final   Organism ID, Bacteria STREPTOCOCCUS MITIS/ORALIS  Final   Organism ID, Bacteria CITROBACTER AMALONATICUS  Final      Susceptibility   Citrobacter amalonaticus - MIC*    CEFEPIME <=0.12 SENSITIVE Sensitive     CEFTAZIDIME <=1 SENSITIVE Sensitive     CEFTRIAXONE <=0.25 SENSITIVE Sensitive     CIPROFLOXACIN <=0.25 SENSITIVE Sensitive     GENTAMICIN <=1 SENSITIVE Sensitive     IMIPENEM <=0.25  SENSITIVE Sensitive     TRIMETH/SULFA <=20 SENSITIVE Sensitive     PIP/TAZO <=4 SENSITIVE Sensitive ug/mL    * RARE CITROBACTER AMALONATICUS   Streptococcus mitis/oralis - MIC*     PENICILLIN <=0.06 SENSITIVE Sensitive     CEFTRIAXONE 0.25 SENSITIVE Sensitive     LEVOFLOXACIN 1 SENSITIVE Sensitive     VANCOMYCIN 0.5 SENSITIVE Sensitive     * FEW STREPTOCOCCUS MITIS/ORALIS    Coagulation Studies: No results for input(s): "LABPROT", "INR" in the last 72 hours.   Urinalysis: No results for input(s): "COLORURINE", "LABSPEC", "PHURINE", "GLUCOSEU", "HGBUR", "BILIRUBINUR", "KETONESUR", "PROTEINUR", "UROBILINOGEN", "NITRITE", "LEUKOCYTESUR" in the last 72 hours.  Invalid input(s): "APPERANCEUR"    Imaging: No results found.      Medications:      amLODipine  10 mg Oral Daily   Chlorhexidine Gluconate Cloth  6 each Topical Q0600   epoetin alfa-epbx (RETACRIT) injection  10,000 Units Intravenous Q M,W,F-HD   feeding supplement (NEPRO CARB STEADY)  237 mL Oral BID BM   irbesartan  300 mg Oral Daily   lactulose  20 g Oral BID   levofloxacin  250 mg Oral Daily   metoprolol tartrate  25 mg Oral BID   metroNIDAZOLE  500 mg Oral Q12H   pantoprazole  40 mg Oral Daily   tamsulosin  0.4 mg Oral Daily   acetaminophen, alum & mag hydroxide-simeth, antiseptic oral rinse, hydrALAZINE, hydrOXYzine, ondansetron (ZOFRAN) IV  Assessment/ Plan:  Mr. Casey Franco is a 63 y.o.  male with HTN, ESRD, DM-2, Hep C.  Patient presents with chest pain and has been admitted for Intractable vomiting [R11.10] Abdominal pain [R10.9] Pneumonia of both lungs due to infectious organism, unspecified part of lung [J18.9] Sepsis without acute organ dysfunction, due to unspecified organism [facility]) [A41.9]  [facility]/MWF/right chest PermCath/71.0 kg  End-stage renal disease on hemodialysis.  Patient received dialysis treatment yesterday.  Patient is significantly below his dry weight.  Will continue to perform scheduled dialysis with no UF.  Monitoring discharge plan, which will include rehab placement.  2. Anemia of chronic kidney disease Lab Results  Component Value  Date   HGB 7.9 (L) 12/10/2023    Hemoglobin 7.9.  Continue Epogen 10,000's IV with dialysis treatments.  3. Secondary Hyperparathyroidism: with outpatient labs: PTH 351, phosphorus 6.6, calcium 8.1 on 11/23/2023.   Lab Results  Component Value Date   PTH 31 08/30/2018   CALCIUM 7.8 (L) 12/10/2023   PHOS 3.9 12/09/2023    Calcium and phosphorus acceptable.  Continue to monitor bone metabolism parameters.  4.  Hypertension with chronic kidney disease.  Home regimen includes furosemide, hydralazine, amlodipine, and irbesartan. Blood pressure well-controlled.  Maintain the patient on amlodipine, Avapro, and metoprolol.  5.  Cirrhosis/ascites, spontaneous bacterial peritonitis. Paracentesis from 11/27/2023 yielded 1.6 L of fluid.  Complex loculations suspected as more ascites present. Microbiology results show Staph epidermidis in the blood. Peritoneal fluid culture from 11/26/2023 shows Streptococcus and Citrobacter. Lactulose twice a day. Paracentesis on 12/02/23 with 4.6L removed. Fluid cultures negative  Patient maintained on levofloxacin, lactulose, and Flagyl.   LOS: 14 Jeromey Kruer 2/27/202512:33 PM

## 2023-12-10 NOTE — TOC Progression Note (Signed)
 Transition of Care [facility]) - Progression Note    Patient Details  Name: Casey Franco MRN: [id number] Date of Birth: 01/18/1961  Transition of [facility]) CM/SW Contact  Truddie Hidden, RN Phone Number: 12/10/2023, 4:22 PM  Clinical Narrative:    Sherron Monday with Ricky from [facility]. Per Clide Cliff more information for dialysis and information for antibiotics. Information provided for dialysis case manager.          Expected Discharge Plan and Services                                               Social Determinants of Health (SDOH) Interventions SDOH Screenings   Food Insecurity: Patient Declined (11/28/2023)  Housing: Low Risk  (12/06/2023)  Transportation Needs: Patient Declined (11/28/2023)  Utilities: Not At Risk (11/28/2023)  Financial Resource Strain: Low Risk  (05/15/2020)   Received from [facility], [facility]  Tobacco Use: Medium Risk (11/26/2023)    Readmission Risk Interventions     No data to display

## 2023-12-11 DIAGNOSIS — K652 Spontaneous bacterial peritonitis: Secondary | ICD-10-CM | POA: Diagnosis not present

## 2023-12-11 LAB — CBC WITH DIFFERENTIAL/PLATELET
Abs Immature Granulocytes: 0.02 10*3/uL (ref 0.00–0.07)
Basophils Absolute: 0 10*3/uL (ref 0.0–0.1)
Basophils Relative: 1 %
Eosinophils Absolute: 0 10*3/uL (ref 0.0–0.5)
Eosinophils Relative: 1 %
HCT: 23 % — ABNORMAL LOW (ref 39.0–52.0)
Hemoglobin: 7.6 g/dL — ABNORMAL LOW (ref 13.0–17.0)
Immature Granulocytes: 1 %
Lymphocytes Relative: 19 %
Lymphs Abs: 0.7 10*3/uL (ref 0.7–4.0)
MCH: 29.8 pg (ref 26.0–34.0)
MCHC: 33 g/dL (ref 30.0–36.0)
MCV: 90.2 fL (ref 80.0–100.0)
Monocytes Absolute: 0.8 10*3/uL (ref 0.1–1.0)
Monocytes Relative: 22 %
Neutro Abs: 2.2 10*3/uL (ref 1.7–7.7)
Neutrophils Relative %: 56 %
Platelets: 123 10*3/uL — ABNORMAL LOW (ref 150–400)
RBC: 2.55 MIL/uL — ABNORMAL LOW (ref 4.22–5.81)
RDW: 16.3 % — ABNORMAL HIGH (ref 11.5–15.5)
WBC: 3.8 10*3/uL — ABNORMAL LOW (ref 4.0–10.5)
nRBC: 0 % (ref 0.0–0.2)

## 2023-12-11 LAB — BASIC METABOLIC PANEL
Anion gap: 11 (ref 5–15)
BUN: 22 mg/dL (ref 8–23)
CO2: 22 mmol/L (ref 22–32)
Calcium: 7.9 mg/dL — ABNORMAL LOW (ref 8.9–10.3)
Chloride: 102 mmol/L (ref 98–111)
Creatinine, Ser: 5.63 mg/dL — ABNORMAL HIGH (ref 0.61–1.24)
GFR, Estimated: 11 mL/min — ABNORMAL LOW (ref >60)
Glucose, Bld: 103 mg/dL — ABNORMAL HIGH (ref 70–99)
Potassium: 3.5 mmol/L (ref 3.5–5.1)
Sodium: 135 mmol/L (ref 135–145)

## 2023-12-11 MED ORDER — ALTEPLASE 2 MG IJ SOLR: 2.0000 mg | Freq: Once | INTRAMUSCULAR | Status: DC | PRN

## 2023-12-11 MED ORDER — EPOETIN ALFA-EPBX 10000 UNIT/ML IJ SOLN
INTRAMUSCULAR | Status: AC
Start: 2023-12-11 — End: ?
  Filled 2023-12-11: qty 1

## 2023-12-11 MED ORDER — LEVOFLOXACIN 250 MG PO TABS
250.0000 mg | ORAL_TABLET | Freq: Every day | ORAL | Status: AC
  Administered 2023-12-11 – 2023-12-12 (×2): 250 mg via ORAL
  Filled 2023-12-11 (×2): qty 1

## 2023-12-11 MED ORDER — CIPROFLOXACIN HCL 500 MG PO TABS
250.0000 mg | ORAL_TABLET | Freq: Every day | ORAL | Status: DC
  Administered 2023-12-13 – 2023-12-22 (×10): 250 mg via ORAL
  Filled 2023-12-11 (×10): qty 1

## 2023-12-11 MED ORDER — HEPARIN SODIUM (PORCINE) 1000 UNIT/ML DIALYSIS: 1000.0000 [IU] | INTRAMUSCULAR | Status: DC | PRN

## 2023-12-11 NOTE — Progress Notes (Signed)
 OT Cancellation Note  Patient Details Name: [name] MRN: [id number] DOB: 10/30/1960   Cancelled Treatment:    Reason Eval/Treat Not Completed: Patient at procedure or test/ unavailable. Pt in dialysis. Will re-attempt at later date/time as pt is available.   Arman Filter., MPH, MS, OTR/L ascom [phone] 12/11/23, 8:26 AM

## 2023-12-11 NOTE — Plan of Care (Signed)

## 2023-12-11 NOTE — Progress Notes (Signed)
 [facility]  ROUNDING NOTE   Subjective:   Casey Franco is a 63 y.o. male with past medical history of HTN, ESRD, DM-2, and Hep C. Patient presents to emergency department with abdominal pain. He has been admitted for Intractable vomiting [R11.10] Abdominal pain [R10.9] Pneumonia of both lungs due to infectious organism, unspecified part of lung [J18.9] Sepsis without acute organ dysfunction, due to unspecified organism [facility]) [A41.9]  Patient is known to our practice and receives outpatient dialysis at [facility] on a MWF, last treatment on Tuesday.    Update Patient seen and evaluated during dialysis   HEMODIALYSIS FLOWSHEET:  Blood Flow Rate (mL/min): 349 mL/min Arterial Pressure (mmHg): -143.02 mmHg Venous Pressure (mmHg): 238.17 mmHg TMP (mmHg): 5.66 mmHg Ultrafiltration Rate (mL/min): 386 mL/min Dialysate Flow Rate (mL/min): 300 ml/min  Tolerating treatment well No UF   Objective:  Vital signs in last 24 hours:  Temp:  [98.2 F (36.8 C)-98.6 F (37 C)] 98.4 F (36.9 C) (02/28 0750) Pulse Rate:  [76-78] 77 (02/28 1000) Resp:  [13-20] 13 (02/28 1000) BP: (95-122)/(65-77) 121/75 (02/28 1000) SpO2:  [96 %-100 %] 100 % (02/28 1000) Weight:  [56.7 kg] 56.7 kg (02/28 0818)  Weight change:  Filed Weights   12/09/23 0822 12/09/23 1211 12/11/23 0818  Weight: 57.8 kg 57.8 kg 56.7 kg    Intake/Output: I/O last 3 completed shifts: In: 140 [P.O.:140] Out: -    Intake/Output this shift:  No intake/output data recorded.  Physical Exam: General: NAD  Head: Normocephalic, atraumatic. Moist oral mucosal membranes  Eyes: Anicteric  Lungs:  Clear to auscultation, normal effort  Heart: Regular rate and rhythm, aflutter  Abdomen:  Soft, tender, mild distension  Extremities: No peripheral edema.  Neurologic: Alert and oriented, moving all four extremities  Skin: No lesions  Access: Right chest tunneled catheter    Basic Metabolic  Panel: Recent Labs  Lab 12/07/23 0608 12/09/23 0428 12/10/23 0443 12/11/23 0415  NA 134* 136 132* 135  K 3.4* 3.5 3.6 3.5  CL 101 102 99 102  CO2 21* 22 23 22   GLUCOSE 96 99 120* 103*  BUN 38* 28* 15 22  CREATININE 6.89* 6.26* 4.44* 5.63*  CALCIUM 7.8* 7.8* 7.8* 7.9*  PHOS 4.3 3.9  --   --     Liver Function Tests: Recent Labs  Lab 12/07/23 0608 12/09/23 0428  ALBUMIN 1.8* 1.9*   No results for input(s): "LIPASE", "AMYLASE" in the last 168 hours.  No results for input(s): "AMMONIA" in the last 168 hours.   CBC: Recent Labs  Lab 12/07/23 0807 12/09/23 0428 12/10/23 0443 12/11/23 0415  WBC 5.7 5.1 4.4 3.8*  NEUTROABS  --   --  2.5 2.2  HGB 7.7* 7.9* 7.9* 7.6*  HCT 23.8* 23.8* 24.3* 23.0*  MCV 92.6 89.8 91.4 90.2  PLT 136* 136* 137* 123*    Cardiac Enzymes: No results for input(s): "CKTOTAL", "CKMB", "CKMBINDEX", "TROPONINI" in the last 168 hours.  BNP: Invalid input(s): "POCBNP"  CBG: Recent Labs  Lab 12/06/23 0753 12/08/23 0903 12/09/23 0738 12/09/23 1230 12/09/23 1702  GLUCAP 88 123* 86 76 104*    Microbiology: Results for orders placed or performed during the hospital encounter of 11/26/23  Culture, blood (Routine x 2)     Status: Abnormal   Collection Time: 11/26/23 11:22 AM   Specimen: BLOOD  Result Value Ref Range Status   Specimen Description   Final    BLOOD RIGHT ANTECUBITAL Performed at [facility],  [address] [place], [place] [address]    Special Requests   Final    BOTTLES DRAWN AEROBIC AND ANAEROBIC Blood Culture results may not be optimal due to an inadequate volume of blood received in culture bottles Performed at [facility], [address] [place], [place] [address]    Culture  Setup Time   Final    ANAEROBIC BOTTLE ONLY GRAM NEGATIVE RODS CRITICAL RESULT CALLED TO, READ BACK BY AND VERIFIED WITH: CAROLINE COULTER @ 11/28/23 2033 AB    Culture (A)  Final    BACTEROIDES THETAIOTAOMICRON BETA  LACTAMASE POSITIVE Performed at [facility], [address] [place], [place] [address]    Report Status 12/01/2023 FINAL  Final  Blood Culture ID Panel (Reflexed)     Status: None   Collection Time: 11/26/23 11:22 AM  Result Value Ref Range Status   Enterococcus faecalis NOT DETECTED NOT DETECTED Final   Enterococcus Faecium NOT DETECTED NOT DETECTED Final   Listeria monocytogenes NOT DETECTED NOT DETECTED Final   Staphylococcus species NOT DETECTED NOT DETECTED Final   Staphylococcus aureus (BCID) NOT DETECTED NOT DETECTED Final   Staphylococcus epidermidis NOT DETECTED NOT DETECTED Final   Staphylococcus lugdunensis NOT DETECTED NOT DETECTED Final   Streptococcus species NOT DETECTED NOT DETECTED Final   Streptococcus agalactiae NOT DETECTED NOT DETECTED Final   Streptococcus pneumoniae NOT DETECTED NOT DETECTED Final   Streptococcus pyogenes NOT DETECTED NOT DETECTED Final   A.calcoaceticus-baumannii NOT DETECTED NOT DETECTED Final   Bacteroides fragilis NOT DETECTED NOT DETECTED Final   Enterobacterales NOT DETECTED NOT DETECTED Final   Enterobacter cloacae complex NOT DETECTED NOT DETECTED Final   Escherichia coli NOT DETECTED NOT DETECTED Final   Klebsiella aerogenes NOT DETECTED NOT DETECTED Final   Klebsiella oxytoca NOT DETECTED NOT DETECTED Final   Klebsiella pneumoniae NOT DETECTED NOT DETECTED Final   Proteus species NOT DETECTED NOT DETECTED Final   Salmonella species NOT DETECTED NOT DETECTED Final   Serratia marcescens NOT DETECTED NOT DETECTED Final   Haemophilus influenzae NOT DETECTED NOT DETECTED Final   Neisseria meningitidis NOT DETECTED NOT DETECTED Final   Pseudomonas aeruginosa NOT DETECTED NOT DETECTED Final   Stenotrophomonas maltophilia NOT DETECTED NOT DETECTED Final   Candida albicans NOT DETECTED NOT DETECTED Final   Candida auris NOT DETECTED NOT DETECTED Final   Candida glabrata NOT DETECTED NOT DETECTED Final   Candida krusei NOT DETECTED  NOT DETECTED Final   Candida parapsilosis NOT DETECTED NOT DETECTED Final   Candida tropicalis NOT DETECTED NOT DETECTED Final   Cryptococcus neoformans/gattii NOT DETECTED NOT DETECTED Final    Comment: Performed at [facility], [address] [place], [place] [address]  Culture, blood (Routine x 2)     Status: Abnormal   Collection Time: 11/26/23 12:23 PM   Specimen: BLOOD LEFT ARM  Result Value Ref Range Status   Specimen Description   Final    BLOOD LEFT ARM Performed at [facility], [address], [place], [place] [address]    Special Requests   Final    BOTTLES DRAWN AEROBIC AND ANAEROBIC Blood Culture results may not be optimal due to an inadequate volume of blood received in culture bottles Performed at [facility], [address] [place], [place] [address]    Culture  Setup Time   Final    GRAM POSITIVE COCCI AEROBIC BOTTLE ONLY CRITICAL RESULT CALLED TO, READ BACK BY AND VERIFIED WITH: BRIANA ALLEY ON  11/27/23 AT 1107 QSD    Culture (A)  Final    STAPHYLOCOCCUS EPIDERMIDIS THE SIGNIFICANCE OF ISOLATING THIS ORGANISM FROM A SINGLE SET OF BLOOD CULTURES WHEN MULTIPLE SETS ARE DRAWN IS UNCERTAIN. PLEASE NOTIFY THE MICROBIOLOGY DEPARTMENT WITHIN ONE WEEK IF SPECIATION AND SENSITIVITIES ARE REQUIRED. Performed at [facility], [address], [place], [place] [address]    Report Status 11/29/2023 FINAL  Final  Blood Culture ID Panel (Reflexed)     Status: Abnormal   Collection Time: 11/26/23 12:23 PM  Result Value Ref Range Status   Enterococcus faecalis NOT DETECTED NOT DETECTED Final   Enterococcus Faecium NOT DETECTED NOT DETECTED Final   Listeria monocytogenes NOT DETECTED NOT DETECTED Final   Staphylococcus species DETECTED (A) NOT DETECTED Final    Comment: CRITICAL RESULT CALLED TO, READ BACK BY AND VERIFIED WITH: BRIANA ALLEY ON 11/27/23 AT 1107 QSD    Staphylococcus aureus (BCID) NOT DETECTED NOT DETECTED Final   Staphylococcus  epidermidis DETECTED (A) NOT DETECTED Final    Comment: CRITICAL RESULT CALLED TO, READ BACK BY AND VERIFIED WITH: BRIANA ALLEY ON 11/27/23 AT 1107 QSD    Staphylococcus lugdunensis NOT DETECTED NOT DETECTED Final   Streptococcus species NOT DETECTED NOT DETECTED Final   Streptococcus agalactiae NOT DETECTED NOT DETECTED Final   Streptococcus pneumoniae NOT DETECTED NOT DETECTED Final   Streptococcus pyogenes NOT DETECTED NOT DETECTED Final   A.calcoaceticus-baumannii NOT DETECTED NOT DETECTED Final   Bacteroides fragilis NOT DETECTED NOT DETECTED Final   Enterobacterales NOT DETECTED NOT DETECTED Final   Enterobacter cloacae complex NOT DETECTED NOT DETECTED Final   Escherichia coli NOT DETECTED NOT DETECTED Final   Klebsiella aerogenes NOT DETECTED NOT DETECTED Final   Klebsiella oxytoca NOT DETECTED NOT DETECTED Final   Klebsiella pneumoniae NOT DETECTED NOT DETECTED Final   Proteus species NOT DETECTED NOT DETECTED Final   Salmonella species NOT DETECTED NOT DETECTED Final   Serratia marcescens NOT DETECTED NOT DETECTED Final   Haemophilus influenzae NOT DETECTED NOT DETECTED Final   Neisseria meningitidis NOT DETECTED NOT DETECTED Final   Pseudomonas aeruginosa NOT DETECTED NOT DETECTED Final   Stenotrophomonas maltophilia NOT DETECTED NOT DETECTED Final   Candida albicans NOT DETECTED NOT DETECTED Final   Candida auris NOT DETECTED NOT DETECTED Final   Candida glabrata NOT DETECTED NOT DETECTED Final   Candida krusei NOT DETECTED NOT DETECTED Final   Candida parapsilosis NOT DETECTED NOT DETECTED Final   Candida tropicalis NOT DETECTED NOT DETECTED Final   Cryptococcus neoformans/gattii NOT DETECTED NOT DETECTED Final   Methicillin resistance mecA/C NOT DETECTED NOT DETECTED Final    Comment: Performed at [facility], [address] [place], [place] [address]  Body fluid culture w Gram Stain     Status: None   Collection Time: 11/26/23  8:55 PM   Specimen: PATH  Cytology Peritoneal fluid  Result Value Ref Range Status   Specimen Description   Final    PERITONEAL CYTO Performed at [facility], [address], [place], [place] [address]    Special Requests   Final    NONE Performed at [facility], [address] [place], [place] [address]    Gram Stain   Final    FEW WBC PRESENT,BOTH PMN AND MONONUCLEAR FEW GRAM POSITIVE COCCI IN CHAINS MODERATE GRAM NEGATIVE RODS FEW GRAM POSITIVE RODS Performed at [facility], [address] [place], [place] [address]    Culture   Final  MODERATE STREPTOCOCCUS MITIS/ORALIS RARE CITROBACTER AMALONATICUS    Report Status 11/30/2023 FINAL  Final   Organism ID, Bacteria STREPTOCOCCUS MITIS/ORALIS  Final   Organism ID, Bacteria CITROBACTER AMALONATICUS  Final      Susceptibility   Citrobacter amalonaticus - MIC*    CEFEPIME <=0.12 SENSITIVE Sensitive     CEFTAZIDIME <=1 SENSITIVE Sensitive     CEFTRIAXONE <=0.25 SENSITIVE Sensitive     CIPROFLOXACIN <=0.25 SENSITIVE Sensitive     GENTAMICIN <=1 SENSITIVE Sensitive     IMIPENEM <=0.25 SENSITIVE Sensitive     TRIMETH/SULFA <=20 SENSITIVE Sensitive     PIP/TAZO <=4 SENSITIVE Sensitive ug/mL    * RARE CITROBACTER AMALONATICUS   Streptococcus mitis/oralis - MIC*    PENICILLIN 0.25 INTERMEDIATE Intermediate     CEFTRIAXONE 0.25 SENSITIVE Sensitive     LEVOFLOXACIN 1 SENSITIVE Sensitive     VANCOMYCIN 0.5 SENSITIVE Sensitive     * MODERATE STREPTOCOCCUS MITIS/ORALIS  Culture, blood (Routine X 2) w Reflex to ID Panel     Status: None   Collection Time: 11/28/23  5:43 AM   Specimen: BLOOD LEFT ARM  Result Value Ref Range Status   Specimen Description BLOOD LEFT ARM  Final   Special Requests   Final    BOTTLES DRAWN AEROBIC AND ANAEROBIC Blood Culture results may not be optimal due to an inadequate volume of blood received in culture bottles   Culture   Final    NO GROWTH 5 DAYS Performed at [facility], [address] [place], [place] [address]    Report Status 12/03/2023 FINAL  Final  Culture, blood (Routine X 2) w Reflex to ID Panel     Status: None   Collection Time: 11/28/23  5:47 AM   Specimen: BLOOD RIGHT HAND  Result Value Ref Range Status   Specimen Description BLOOD RIGHT HAND  Final   Special Requests   Final    BOTTLES DRAWN AEROBIC AND ANAEROBIC Blood Culture results may not be optimal due to an inadequate volume of blood received in culture bottles   Culture   Final    NO GROWTH 5 DAYS Performed at [facility], [address], [place], [place] [address]    Report Status 12/03/2023 FINAL  Final  Body fluid culture w Gram Stain     Status: None   Collection Time: 12/01/23 10:10 AM   Specimen: Ascitic; Body Fluid  Result Value Ref Range Status   Specimen Description   Final    ASCITIC Performed at [facility], [address] [place], [place] [address]    Special Requests   Final    peri Performed at [facility], [address] [place], [place] [address]    Gram Stain   Final    FEW WBC PRESENT, PREDOMINANTLY PMN ABUNDANT GRAM NEGATIVE RODS ABUNDANT GRAM POSITIVE COCCI RARE GRAM POSITIVE RODS    Culture   Final    FEW STREPTOCOCCUS MITIS/ORALIS RARE CITROBACTER AMALONATICUS MODERATE BACTEROIDES THETAIOTAOMICRON BETA LACTAMASE POSITIVE RARE EIKENELLA CORRODENS Usually susceptible to penicillin and other beta lactam agents,quinolones,macrolides and tetracyclines. Performed at [facility], [address] [place], [place] [address]    Report Status 12/06/2023 FINAL  Final   Organism ID, Bacteria STREPTOCOCCUS MITIS/ORALIS  Final   Organism ID, Bacteria CITROBACTER AMALONATICUS  Final      Susceptibility   Citrobacter amalonaticus - MIC*    CEFEPIME <=0.12 SENSITIVE Sensitive     CEFTAZIDIME <=1 SENSITIVE Sensitive  CEFTRIAXONE <=0.25 SENSITIVE Sensitive     CIPROFLOXACIN <=0.25 SENSITIVE Sensitive     GENTAMICIN <=1  SENSITIVE Sensitive     IMIPENEM <=0.25 SENSITIVE Sensitive     TRIMETH/SULFA <=20 SENSITIVE Sensitive     PIP/TAZO <=4 SENSITIVE Sensitive ug/mL    * RARE CITROBACTER AMALONATICUS   Streptococcus mitis/oralis - MIC*    PENICILLIN <=0.06 SENSITIVE Sensitive     CEFTRIAXONE 0.25 SENSITIVE Sensitive     LEVOFLOXACIN 1 SENSITIVE Sensitive     VANCOMYCIN 0.5 SENSITIVE Sensitive     * FEW STREPTOCOCCUS MITIS/ORALIS    Coagulation Studies: No results for input(s): "LABPROT", "INR" in the last 72 hours.   Urinalysis: No results for input(s): "COLORURINE", "LABSPEC", "PHURINE", "GLUCOSEU", "HGBUR", "BILIRUBINUR", "KETONESUR", "PROTEINUR", "UROBILINOGEN", "NITRITE", "LEUKOCYTESUR" in the last 72 hours.  Invalid input(s): "APPERANCEUR"    Imaging: No results found.      Medications:      amLODipine  10 mg Oral Daily   Chlorhexidine Gluconate Cloth  6 each Topical Q0600   epoetin alfa-epbx (RETACRIT) injection  10,000 Units Intravenous Q M,W,F-HD   feeding supplement (NEPRO CARB STEADY)  237 mL Oral BID BM   irbesartan  300 mg Oral Daily   lactulose  20 g Oral BID   levofloxacin  250 mg Oral Daily   metoprolol tartrate  25 mg Oral BID   metroNIDAZOLE  500 mg Oral Q12H   pantoprazole  40 mg Oral Daily   tamsulosin  0.4 mg Oral Daily   acetaminophen, alteplase, alum & mag hydroxide-simeth, antiseptic oral rinse, heparin, hydrALAZINE, hydrOXYzine, ondansetron (ZOFRAN) IV  Assessment/ Plan:  Casey Franco is a 63 y.o.  male with HTN, ESRD, DM-2, Hep C.  Patient presents with chest pain and has been admitted for Intractable vomiting [R11.10] Abdominal pain [R10.9] Pneumonia of both lungs due to infectious organism, unspecified part of lung [J18.9] Sepsis without acute organ dysfunction, due to unspecified organism [facility]) [A41.9]  [facility]/MWF/right chest PermCath/71.0 kg  End-stage renal disease on hemodialysis.  Patient receiving scheduled dialysis  today, UF 0.  Next treatment scheduled for Monday.  Monitoring discharge plans which includes rehab placement.  2. Anemia of chronic kidney disease Lab Results  Component Value Date   HGB 7.6 (L) 12/11/2023    Hemoglobin 7.6.  Continue prescribed Epogen with dialysis treatments.  3. Secondary Hyperparathyroidism: with outpatient labs: PTH 351, phosphorus 6.6, calcium 8.1 on 11/23/2023.   Lab Results  Component Value Date   PTH 31 08/30/2018   CALCIUM 7.9 (L) 12/11/2023   PHOS 3.9 12/09/2023    Continue to monitor bone metabolism parameters.  4.  Hypertension with chronic kidney disease.  Home regimen includes furosemide, hydralazine, amlodipine, and irbesartan. Maintain the patient on amlodipine, Avapro, and metoprolol.  Blood pressure 113/75 during dialysis.  5.  Cirrhosis/ascites, spontaneous bacterial peritonitis. Paracentesis from 11/27/2023 yielded 1.6 L of fluid.  Complex loculations suspected as more ascites present. Microbiology results show Staph epidermidis in the blood. Peritoneal fluid culture from 11/26/2023 shows Streptococcus and Citrobacter. Lactulose twice a day. Paracentesis on 12/02/23 with 4.6L removed. Fluid cultures negative  Patient remains on levofloxacin, lactulose, and Flagyl.   LOS: 15 Casey Franco 2/28/202510:29 AM

## 2023-12-11 NOTE — Progress Notes (Signed)
  Received patient in bed to unit.   Informed consent signed and in chart.    TX duration:3 hrs      Transported back to floor  Hand-off given to patient's primary nurse.    Access used: R HD Cath Access issues: poor arterial pull lines reversed      Total UF removed: 0 Medication(s) given: retacrit  Post HD VS: WNL Post HD weight: 57.3     Casey Tibbitts LPN [facility]

## 2023-12-11 NOTE — Progress Notes (Signed)
 PT Cancellation Note  Patient Details Name: Casey Franco MRN: [id number] DOB: 1961-03-18   Cancelled Treatment:    Reason Eval/Treat Not Completed: Patient at procedure or test/ unavailable. Pt in dialysis. Will re-attempt at later date/time as pt is available.   Caleah Tortorelli C PT, DPT 9:08 AM 12/11/23

## 2023-12-11 NOTE — Progress Notes (Signed)
 Progress Note   Patient: Casey Franco MWU:[id number] DOB: [birth year] DOA: 11/26/2023     15 DOS: the patient was seen and examined on 12/11/2023     Brief hospital course: Casey Franco is a 63 y.o. 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  He also complained of nausea, increasing abdominal distention and multiple episodes of vomiting.    Spontaneous bacterial peritonitis Abdominal pain Liver cirrhosis with ascites: S/p paracentesis with removal of 1600 mL of fluid on 11/26/2023.   Fluid positive for SBP (ascitic fluid total nucleated cells 1,326, percentage neutrophils was 68% and absolute PMN count 901) Ascitic fluid culture showed Streptococcus mitis and Citrobacter amalonaticus S/p repeat paracentesis on 12/01/2023 with removal of 4.6 L of fluid. Percentage neutrophil count was 100 and total nucleated cell count 4,687. He was evaluated by the general surgeon.  No indication for surgery at this time.   Continue current antibiotics to complete a total of 2 weeks therapy Patient will need SBP prophylaxis with ciprofloxacin     Staph epidermidis bacteremia: 1 out of 4 bottles from 11/26/2023 positive for Staph epidermidis.  This is likely a contaminant.  Repeat blood cultures from 11/27/2023 has not shown any growth thus far.   Bacteroides  thetaiotaomicron bacteremia (blood cultures from 11/26/2023 growing gram-negative rods).   Continue metronidazole ID pharmacist agreed     Hematemesis: Resolved.  This is probably from multiple episodes of vomiting.   H&H is stable.     Elevated troponins: Troponin is 172, 138, 127 and 128.  No chest pain or acute EKG changes. Due to demand ischemia.     Paroxysmal atrial fibrillation, PVCs: Heart rate is better.  Continue metoprolol.  We discussed risks,  benefits and alternatives to long-term anticoagulation for atrial fibrillation. He is at increased risk for bleeding from long-term anticoagulation because of underlying severe liver and kidney disease. CHA2DS2-VASc score is 3 and he is also at increased risk for stroke. He wants to consider Eliquis for stroke prophylaxis. We will resume Eliquis when closer to discharge and clear no additional procedures needed     Chronic systolic CHF Moderate mitral regurgitation 2D echo showed EF estimated at 40 to 45%, moderate LVH, indeterminate left ventricular diastolic parameters, mildly reduced RV systolic function, moderately enlarged RV size, moderately dilated right atrium, small pericardial effusion, moderate mitral regurgitation --Volume management by dialysis   Confusion on 11/30/2023, likely acute toxic encephalopathy from oxycodone: Oxycodone has been discontinued. Continue delirium precaution     ESRD on hemodialysis MWF --Nephrology following for hemodialysis    Dysphagia: Speech therapist recommended dysphagia 2 diet, thin liquids.     General weakness: PT recommended discharge to SNF.   Follow-up with TOC to assist with placement.     Comorbidities include hypertension, hyperlipidemia, BPH, type II DM     Consultants: General surgery Interventional radiology   Procedures: Paracentesis with removal of 1,600 mL of fluid on 11/26/2023 Paracentesis with removal of 4.6 L of fluid on 12/01/2023    DVT prophylaxis: SCDs Start: 11/26/23 2029       Code Status: Full Code   Family Communication: None Disposition Plan:  SNF/rehab     Status is: Inpatient Remains inpatient appropriate because:  needs SNF placement - pending   Medically stable 12/05/23 for discharge.    Physical Exam: General exam: sleeping, wakes  to voice, no acute distress Respiratory system: normal respiratory effort.on room air, lungs clear Cardiovascular system: RRR, no edema, right upper chest perm cath  in place Gastrointestinal system: soft, NT, non-distended Central nervous system: no gross focal neurologic deficits Extremities: no edema, normal tone Psychiatry: normal mood, congruent affect     Subjective: Patient seen and examined at bedside this morning Denies nausea vomiting abdominal pain chest pain cough   Subjective:  Patient seen and examined at bedside this morning Denies nausea vomiting abdominal pain chest pain cough   Physical Exam: Vitals:   12/11/23 1100 12/11/23 1121 12/11/23 1133 12/11/23 1500  BP: 121/77 124/77  113/73  Pulse: 78 78  80  Resp: 16 11  18   Temp:  98.4 F (36.9 C)  98.5 F (36.9 C)  TempSrc:  Oral  Oral  SpO2: 100% 100%  100%  Weight:   57.3 kg   Height:         Author: Loyce Dys, MD 12/11/2023 6:19 PM  For on call review [email or web address].

## 2023-12-12 DIAGNOSIS — K652 Spontaneous bacterial peritonitis: Secondary | ICD-10-CM | POA: Diagnosis not present

## 2023-12-12 LAB — CBC WITH DIFFERENTIAL/PLATELET
Abs Immature Granulocytes: 0.03 10*3/uL (ref 0.00–0.07)
Basophils Absolute: 0 10*3/uL (ref 0.0–0.1)
Basophils Relative: 1 %
Eosinophils Absolute: 0 10*3/uL (ref 0.0–0.5)
Eosinophils Relative: 1 %
HCT: 24.6 % — ABNORMAL LOW (ref 39.0–52.0)
Hemoglobin: 8 g/dL — ABNORMAL LOW (ref 13.0–17.0)
Immature Granulocytes: 1 %
Lymphocytes Relative: 15 %
Lymphs Abs: 0.7 10*3/uL (ref 0.7–4.0)
MCH: 30.1 pg (ref 26.0–34.0)
MCHC: 32.5 g/dL (ref 30.0–36.0)
MCV: 92.5 fL (ref 80.0–100.0)
Monocytes Absolute: 0.9 10*3/uL (ref 0.1–1.0)
Monocytes Relative: 20 %
Neutro Abs: 2.7 10*3/uL (ref 1.7–7.7)
Neutrophils Relative %: 62 %
Platelets: 122 10*3/uL — ABNORMAL LOW (ref 150–400)
RBC: 2.66 MIL/uL — ABNORMAL LOW (ref 4.22–5.81)
RDW: 16.5 % — ABNORMAL HIGH (ref 11.5–15.5)
Smear Review: NORMAL
WBC: 4.5 10*3/uL (ref 4.0–10.5)
nRBC: 0 % (ref 0.0–0.2)

## 2023-12-12 LAB — BASIC METABOLIC PANEL
Anion gap: 9 (ref 5–15)
BUN: 14 mg/dL (ref 8–23)
CO2: 24 mmol/L (ref 22–32)
Calcium: 8.1 mg/dL — ABNORMAL LOW (ref 8.9–10.3)
Chloride: 104 mmol/L (ref 98–111)
Creatinine, Ser: 4.07 mg/dL — ABNORMAL HIGH (ref 0.61–1.24)
GFR, Estimated: 16 mL/min — ABNORMAL LOW (ref >60)
Glucose, Bld: 88 mg/dL (ref 70–99)
Potassium: 3.7 mmol/L (ref 3.5–5.1)
Sodium: 137 mmol/L (ref 135–145)

## 2023-12-12 MED ORDER — RENA-VITE PO TABS
1.0000 | ORAL_TABLET | Freq: Every day | ORAL | Status: DC
  Administered 2023-12-12 – 2023-12-22 (×11): 1 via ORAL
  Filled 2023-12-12 (×11): qty 1

## 2023-12-12 MED ORDER — ENSURE ENLIVE PO LIQD
237.0000 mL | Freq: Three times a day (TID) | ORAL | Status: DC
  Administered 2023-12-12 – 2023-12-23 (×17): 237 mL via ORAL

## 2023-12-12 NOTE — Progress Notes (Addendum)
 [facility]  ROUNDING NOTE   Subjective:  Casey Franco is a 63 y.o. male with past medical history of HTN, ESRD, DM-2, and Hep C. Patient presents to emergency department with abdominal pain. He has been admitted for Intractable vomiting [R11.10] Abdominal pain [R10.9] Pneumonia of both lungs due to infectious organism, unspecified part of lung [J18.9] Sepsis without acute organ dysfunction, due to unspecified organism [facility]) [A41.9]   Patient is known to our practice and receives outpatient dialysis at [facility] on a MWF, last treatment on Tuesday.     Update Patient in bed resting, in high spirits. Patient reports anticipated goals for discharge at rehab facility, including gaining independence and strength to get back to home. Patient looks forward to reaching goals. Patient denies shortness of breath, denies pain. Patient complaints of diarrhea, patient on lactulose twice a day. Mild complaints of dialysis modality change adjustments from PD independently to HD.    Objective:  Vital signs in last 24 hours:  Temp:  [98.4 F (36.9 C)-98.9 F (37.2 C)] 98.5 F (36.9 C) (03/01 0841) Pulse Rate:  [77-80] 77 (03/01 0841) Resp:  [11-18] 18 (03/01 0841) BP: (104-124)/(67-77) 113/77 (03/01 0841) SpO2:  [100 %] 100 % (03/01 0841) Weight:  [57.3 kg] 57.3 kg (02/28 1133)  Weight change:  Filed Weights   12/09/23 1211 12/11/23 0818 12/11/23 1133  Weight: 57.8 kg 56.7 kg 57.3 kg    Intake/Output: I/O last 3 completed shifts: In: -  Out: 3 [Stool:3]   Intake/Output this shift:  No intake/output data recorded.  Physical Exam: General: NAD,   Head: Normocephalic, atraumatic. Moist oral mucosal membranes  Eyes: Anicteric, PERRL  Neck: Supple, trachea midline  Lungs:  Clear to auscultation  Heart: Regular rate and rhythm  Abdomen:  Soft, nontender,   Extremities:  No peripheral edema.  Neurologic: Nonfocal, moving all four extremities  Skin: No lesions   Access: RT chest permcath    Basic Metabolic Panel: Recent Labs  Lab 12/07/23 0608 12/09/23 0428 12/10/23 0443 12/11/23 0415 12/12/23 0544  NA 134* 136 132* 135 137  K 3.4* 3.5 3.6 3.5 3.7  CL 101 102 99 102 104  CO2 21* 22 23 22 24   GLUCOSE 96 99 120* 103* 88  BUN 38* 28* 15 22 14   CREATININE 6.89* 6.26* 4.44* 5.63* 4.07*  CALCIUM 7.8* 7.8* 7.8* 7.9* 8.1*  PHOS 4.3 3.9  --   --   --     Liver Function Tests: Recent Labs  Lab 12/07/23 0608 12/09/23 0428  ALBUMIN 1.8* 1.9*   No results for input(s): "LIPASE", "AMYLASE" in the last 168 hours. No results for input(s): "AMMONIA" in the last 168 hours.  CBC: Recent Labs  Lab 12/07/23 0807 12/09/23 0428 12/10/23 0443 12/11/23 0415 12/12/23 0544  WBC 5.7 5.1 4.4 3.8* 4.5  NEUTROABS  --   --  2.5 2.2 2.7  HGB 7.7* 7.9* 7.9* 7.6* 8.0*  HCT 23.8* 23.8* 24.3* 23.0* 24.6*  MCV 92.6 89.8 91.4 90.2 92.5  PLT 136* 136* 137* 123* 122*    Cardiac Enzymes: No results for input(s): "CKTOTAL", "CKMB", "CKMBINDEX", "TROPONINI" in the last 168 hours.  BNP: Invalid input(s): "POCBNP"  CBG: Recent Labs  Lab 12/06/23 0753 12/08/23 0903 12/09/23 0738 12/09/23 1230 12/09/23 1702  GLUCAP 88 123* 86 76 104*    Microbiology: Results for orders placed or performed during the hospital encounter of 11/26/23  Culture, blood (Routine x 2)     Status: Abnormal  Collection Time: 11/26/23 11:22 AM   Specimen: BLOOD  Result Value Ref Range Status   Specimen Description   Final    BLOOD RIGHT ANTECUBITAL Performed at [facility], [address] [place], [place] [address]    Special Requests   Final    BOTTLES DRAWN AEROBIC AND ANAEROBIC Blood Culture results may not be optimal due to an inadequate volume of blood received in culture bottles Performed at [facility], [address] [place], [place] [address]    Culture  Setup Time   Final    ANAEROBIC BOTTLE ONLY GRAM NEGATIVE RODS CRITICAL RESULT  CALLED TO, READ BACK BY AND VERIFIED WITH: CAROLINE COULTER @ 11/28/23 2033 AB    Culture (A)  Final    BACTEROIDES THETAIOTAOMICRON BETA LACTAMASE POSITIVE Performed at [facility], [address] [place], [place] [address]    Report Status 12/01/2023 FINAL  Final  Blood Culture ID Panel (Reflexed)     Status: None   Collection Time: 11/26/23 11:22 AM  Result Value Ref Range Status   Enterococcus faecalis NOT DETECTED NOT DETECTED Final   Enterococcus Faecium NOT DETECTED NOT DETECTED Final   Listeria monocytogenes NOT DETECTED NOT DETECTED Final   Staphylococcus species NOT DETECTED NOT DETECTED Final   Staphylococcus aureus (BCID) NOT DETECTED NOT DETECTED Final   Staphylococcus epidermidis NOT DETECTED NOT DETECTED Final   Staphylococcus lugdunensis NOT DETECTED NOT DETECTED Final   Streptococcus species NOT DETECTED NOT DETECTED Final   Streptococcus agalactiae NOT DETECTED NOT DETECTED Final   Streptococcus pneumoniae NOT DETECTED NOT DETECTED Final   Streptococcus pyogenes NOT DETECTED NOT DETECTED Final   A.calcoaceticus-baumannii NOT DETECTED NOT DETECTED Final   Bacteroides fragilis NOT DETECTED NOT DETECTED Final   Enterobacterales NOT DETECTED NOT DETECTED Final   Enterobacter cloacae complex NOT DETECTED NOT DETECTED Final   Escherichia coli NOT DETECTED NOT DETECTED Final   Klebsiella aerogenes NOT DETECTED NOT DETECTED Final   Klebsiella oxytoca NOT DETECTED NOT DETECTED Final   Klebsiella pneumoniae NOT DETECTED NOT DETECTED Final   Proteus species NOT DETECTED NOT DETECTED Final   Salmonella species NOT DETECTED NOT DETECTED Final   Serratia marcescens NOT DETECTED NOT DETECTED Final   Haemophilus influenzae NOT DETECTED NOT DETECTED Final   Neisseria meningitidis NOT DETECTED NOT DETECTED Final   Pseudomonas aeruginosa NOT DETECTED NOT DETECTED Final   Stenotrophomonas maltophilia NOT DETECTED NOT DETECTED Final   Candida albicans NOT DETECTED NOT  DETECTED Final   Candida auris NOT DETECTED NOT DETECTED Final   Candida glabrata NOT DETECTED NOT DETECTED Final   Candida krusei NOT DETECTED NOT DETECTED Final   Candida parapsilosis NOT DETECTED NOT DETECTED Final   Candida tropicalis NOT DETECTED NOT DETECTED Final   Cryptococcus neoformans/gattii NOT DETECTED NOT DETECTED Final    Comment: Performed at [facility], [address] [place], [place] [address]  Culture, blood (Routine x 2)     Status: Abnormal   Collection Time: 11/26/23 12:23 PM   Specimen: BLOOD LEFT ARM  Result Value Ref Range Status   Specimen Description   Final    BLOOD LEFT ARM Performed at [facility], [address], [place], [place] [address]    Special Requests   Final    BOTTLES DRAWN AEROBIC AND ANAEROBIC Blood Culture results may not be optimal due to an inadequate volume of blood received in culture bottles Performed at [facility], [address] [place], [place]  [address]    Culture  Setup Time   Final    GRAM POSITIVE COCCI AEROBIC BOTTLE ONLY CRITICAL RESULT CALLED TO, READ BACK BY AND VERIFIED WITH: BRIANA ALLEY ON 11/27/23 AT 1107 QSD    Culture (A)  Final    STAPHYLOCOCCUS EPIDERMIDIS THE SIGNIFICANCE OF ISOLATING THIS ORGANISM FROM A SINGLE SET OF BLOOD CULTURES WHEN MULTIPLE SETS ARE DRAWN IS UNCERTAIN. PLEASE NOTIFY THE MICROBIOLOGY DEPARTMENT WITHIN ONE WEEK IF SPECIATION AND SENSITIVITIES ARE REQUIRED. Performed at [facility], [address], [place], [place] [address]    Report Status 11/29/2023 FINAL  Final  Blood Culture ID Panel (Reflexed)     Status: Abnormal   Collection Time: 11/26/23 12:23 PM  Result Value Ref Range Status   Enterococcus faecalis NOT DETECTED NOT DETECTED Final   Enterococcus Faecium NOT DETECTED NOT DETECTED Final   Listeria monocytogenes NOT DETECTED NOT DETECTED Final   Staphylococcus species DETECTED (A) NOT DETECTED Final    Comment: CRITICAL RESULT CALLED TO, READ  BACK BY AND VERIFIED WITH: BRIANA ALLEY ON 11/27/23 AT 1107 QSD    Staphylococcus aureus (BCID) NOT DETECTED NOT DETECTED Final   Staphylococcus epidermidis DETECTED (A) NOT DETECTED Final    Comment: CRITICAL RESULT CALLED TO, READ BACK BY AND VERIFIED WITH: BRIANA ALLEY ON 11/27/23 AT 1107 QSD    Staphylococcus lugdunensis NOT DETECTED NOT DETECTED Final   Streptococcus species NOT DETECTED NOT DETECTED Final   Streptococcus agalactiae NOT DETECTED NOT DETECTED Final   Streptococcus pneumoniae NOT DETECTED NOT DETECTED Final   Streptococcus pyogenes NOT DETECTED NOT DETECTED Final   A.calcoaceticus-baumannii NOT DETECTED NOT DETECTED Final   Bacteroides fragilis NOT DETECTED NOT DETECTED Final   Enterobacterales NOT DETECTED NOT DETECTED Final   Enterobacter cloacae complex NOT DETECTED NOT DETECTED Final   Escherichia coli NOT DETECTED NOT DETECTED Final   Klebsiella aerogenes NOT DETECTED NOT DETECTED Final   Klebsiella oxytoca NOT DETECTED NOT DETECTED Final   Klebsiella pneumoniae NOT DETECTED NOT DETECTED Final   Proteus species NOT DETECTED NOT DETECTED Final   Salmonella species NOT DETECTED NOT DETECTED Final   Serratia marcescens NOT DETECTED NOT DETECTED Final   Haemophilus influenzae NOT DETECTED NOT DETECTED Final   Neisseria meningitidis NOT DETECTED NOT DETECTED Final   Pseudomonas aeruginosa NOT DETECTED NOT DETECTED Final   Stenotrophomonas maltophilia NOT DETECTED NOT DETECTED Final   Candida albicans NOT DETECTED NOT DETECTED Final   Candida auris NOT DETECTED NOT DETECTED Final   Candida glabrata NOT DETECTED NOT DETECTED Final   Candida krusei NOT DETECTED NOT DETECTED Final   Candida parapsilosis NOT DETECTED NOT DETECTED Final   Candida tropicalis NOT DETECTED NOT DETECTED Final   Cryptococcus neoformans/gattii NOT DETECTED NOT DETECTED Final   Methicillin resistance mecA/C NOT DETECTED NOT DETECTED Final    Comment: Performed at [facility], [address] [place], [place] [address]  Body fluid culture w Gram Stain     Status: None   Collection Time: 11/26/23  8:55 PM   Specimen: PATH Cytology Peritoneal fluid  Result Value Ref Range Status   Specimen Description   Final    PERITONEAL CYTO Performed at [facility], [address], [place], [place] [address]    Special Requests   Final    NONE Performed at [facility], [address] [place], [place] [address]    Gram Stain   Final    FEW WBC PRESENT,BOTH PMN AND MONONUCLEAR FEW Romie Minus  POSITIVE COCCI IN CHAINS MODERATE GRAM NEGATIVE RODS FEW GRAM POSITIVE RODS Performed at [facility], [address] [place], [place] [address]    Culture   Final    MODERATE STREPTOCOCCUS MITIS/ORALIS RARE CITROBACTER AMALONATICUS    Report Status 11/30/2023 FINAL  Final   Organism ID, Bacteria STREPTOCOCCUS MITIS/ORALIS  Final   Organism ID, Bacteria CITROBACTER AMALONATICUS  Final      Susceptibility   Citrobacter amalonaticus - MIC*    CEFEPIME <=0.12 SENSITIVE Sensitive     CEFTAZIDIME <=1 SENSITIVE Sensitive     CEFTRIAXONE <=0.25 SENSITIVE Sensitive     CIPROFLOXACIN <=0.25 SENSITIVE Sensitive     GENTAMICIN <=1 SENSITIVE Sensitive     IMIPENEM <=0.25 SENSITIVE Sensitive     TRIMETH/SULFA <=20 SENSITIVE Sensitive     PIP/TAZO <=4 SENSITIVE Sensitive ug/mL    * RARE CITROBACTER AMALONATICUS   Streptococcus mitis/oralis - MIC*    PENICILLIN 0.25 INTERMEDIATE Intermediate     CEFTRIAXONE 0.25 SENSITIVE Sensitive     LEVOFLOXACIN 1 SENSITIVE Sensitive     VANCOMYCIN 0.5 SENSITIVE Sensitive     * MODERATE STREPTOCOCCUS MITIS/ORALIS  Culture, blood (Routine X 2) w Reflex to ID Panel     Status: None   Collection Time: 11/28/23  5:43 AM   Specimen: BLOOD LEFT ARM  Result Value Ref Range Status   Specimen Description BLOOD LEFT ARM  Final   Special Requests   Final    BOTTLES DRAWN AEROBIC AND ANAEROBIC Blood Culture results may not be optimal due  to an inadequate volume of blood received in culture bottles   Culture   Final    NO GROWTH 5 DAYS Performed at [facility], [address] [place], [place] [address]    Report Status 12/03/2023 FINAL  Final  Culture, blood (Routine X 2) w Reflex to ID Panel     Status: None   Collection Time: 11/28/23  5:47 AM   Specimen: BLOOD RIGHT HAND  Result Value Ref Range Status   Specimen Description BLOOD RIGHT HAND  Final   Special Requests   Final    BOTTLES DRAWN AEROBIC AND ANAEROBIC Blood Culture results may not be optimal due to an inadequate volume of blood received in culture bottles   Culture   Final    NO GROWTH 5 DAYS Performed at [facility], [address], [place], [place] [address]    Report Status 12/03/2023 FINAL  Final  Body fluid culture w Gram Stain     Status: None   Collection Time: 12/01/23 10:10 AM   Specimen: Ascitic; Body Fluid  Result Value Ref Range Status   Specimen Description   Final    ASCITIC Performed at [facility], [address] [place], [place] [address]    Special Requests   Final    peri Performed at [facility], [address] [place], [place] [address]    Gram Stain   Final    FEW WBC PRESENT, PREDOMINANTLY PMN ABUNDANT GRAM NEGATIVE RODS ABUNDANT GRAM POSITIVE COCCI RARE GRAM POSITIVE RODS    Culture   Final    FEW STREPTOCOCCUS MITIS/ORALIS RARE CITROBACTER AMALONATICUS MODERATE BACTEROIDES THETAIOTAOMICRON BETA LACTAMASE POSITIVE RARE EIKENELLA CORRODENS Usually susceptible to penicillin and other beta lactam agents,quinolones,macrolides and tetracyclines. Performed at [facility], [address], [place], [place] [address]    Report Status 12/06/2023 FINAL  Final   Organism ID, Bacteria STREPTOCOCCUS MITIS/ORALIS  Final   Organism  ID, Bacteria CITROBACTER AMALONATICUS  Final      Susceptibility   Citrobacter amalonaticus - MIC*    CEFEPIME <=0.12 SENSITIVE Sensitive      CEFTAZIDIME <=1 SENSITIVE Sensitive     CEFTRIAXONE <=0.25 SENSITIVE Sensitive     CIPROFLOXACIN <=0.25 SENSITIVE Sensitive     GENTAMICIN <=1 SENSITIVE Sensitive     IMIPENEM <=0.25 SENSITIVE Sensitive     TRIMETH/SULFA <=20 SENSITIVE Sensitive     PIP/TAZO <=4 SENSITIVE Sensitive ug/mL    * RARE CITROBACTER AMALONATICUS   Streptococcus mitis/oralis - MIC*    PENICILLIN <=0.06 SENSITIVE Sensitive     CEFTRIAXONE 0.25 SENSITIVE Sensitive     LEVOFLOXACIN 1 SENSITIVE Sensitive     VANCOMYCIN 0.5 SENSITIVE Sensitive     * FEW STREPTOCOCCUS MITIS/ORALIS    Coagulation Studies: No results for input(s): "LABPROT", "INR" in the last 72 hours.  Urinalysis: No results for input(s): "COLORURINE", "LABSPEC", "PHURINE", "GLUCOSEU", "HGBUR", "BILIRUBINUR", "KETONESUR", "PROTEINUR", "UROBILINOGEN", "NITRITE", "LEUKOCYTESUR" in the last 72 hours.  Invalid input(s): "APPERANCEUR"    Imaging: No results found.   Medications:     amLODipine  10 mg Oral Daily   Chlorhexidine Gluconate Cloth  6 each Topical Q0600   [START ON 12/13/2023] ciprofloxacin  250 mg Oral QHS   epoetin alfa-epbx (RETACRIT) injection  10,000 Units Intravenous Q M,W,F-HD   feeding supplement (NEPRO CARB STEADY)  237 mL Oral BID BM   irbesartan  300 mg Oral Daily   lactulose  20 g Oral BID   levofloxacin  250 mg Oral Daily   metoprolol tartrate  25 mg Oral BID   metroNIDAZOLE  500 mg Oral Q12H   pantoprazole  40 mg Oral Daily   tamsulosin  0.4 mg Oral Daily   acetaminophen, alum & mag hydroxide-simeth, antiseptic oral rinse, hydrALAZINE, hydrOXYzine, ondansetron (ZOFRAN) IV  Assessment/ Plan:  Mr. TRAYLON SCHIMMING is a 63 y.o.  male with HTN, ESRD, DM-2, Hep C.  Patient presents with chest pain and has been admitted for Intractable vomiting [R11.10] Abdominal pain [R10.9] Pneumonia of both lungs due to infectious organism, unspecified part of lung [J18.9] Sepsis without acute organ dysfunction, due to unspecified  organism [facility]) [A41.9]   [facility]/MWF/right chest PermCath/71.0 kg   End-stage renal disease on hemodialysis. Treatment scheduled for Monday.  Monitoring discharge plans which includes rehab placement.    2. Anemia of chronic kidney disease Recent Labs       Lab Results  Component Value Date    HGB 8.0 (L) 12/12/2023      Hemoglobin 8  Continue prescribed Epogen with dialysis treatments.   3. Secondary Hyperparathyroidism: with outpatient labs: PTH 351, phosphorus 6.6, calcium 8.1 on 11/23/2023.    Recent Labs       Lab Results  Component Value Date    PTH 31 08/30/2018    CALCIUM 8.1 (L) 03/01//2025    PHOS 3.9 12/09/2023      Continue to monitor bone metabolism parameters.   4.  Hypertension with chronic kidney disease.  Home regimen includes furosemide, hydralazine, amlodipine, and irbesartan. Maintain the patient on amlodipine, Avapro, and metoprolol.             Blood pressure 113/77    5.  Cirrhosis/ascites, spontaneous bacterial peritonitis. Paracentesis from 11/27/2023 yielded 1.6 L of fluid.  Complex loculations suspected as more ascites present. Microbiology results show Staph epidermidis in the blood. Peritoneal fluid culture from 11/26/2023 shows Streptococcus and Citrobacter. Lactulose twice a day.  Paracentesis on 12/02/23 with 4.6L removed. Fluid cultures negative             Patient remains on levofloxacin, lactulose, and Flagyl.   6. Malnutrition  Patient reports weight loss of 30lbs in 2 months. Complaints of diarrhea, albumin 1.9 Dietary consult placed for nutritional needs    LOS: 16 Sheppard Luckenbach P Sylis Ketchum 3/1/202511:11 AM

## 2023-12-12 NOTE — Progress Notes (Signed)
 Progress Note   Patient: Casey Franco:[id number] DOB: [birth year] DOA: 11/26/2023     16 DOS: the patient was seen and examined on 12/12/2023    Brief hospital course: DAMEER SPEISER is a 63 y.o. 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  He also complained of nausea, increasing abdominal distention and multiple episodes of vomiting.    Spontaneous bacterial peritonitis Abdominal pain Liver cirrhosis with ascites: S/p paracentesis with removal of 1600 mL of fluid on 11/26/2023.   Fluid positive for SBP (ascitic fluid total nucleated cells 1,326, percentage neutrophils was 68% and absolute PMN count 901) Ascitic fluid culture showed Streptococcus mitis and Citrobacter amalonaticus S/p repeat paracentesis on 12/01/2023 with removal of 4.6 L of fluid. Percentage neutrophil count was 100 and total nucleated cell count 4,687. He was evaluated by the general surgeon.  No indication for surgery at this time.   Continue current antibiotics to complete a total of 2 weeks therapy Patient will need SBP prophylaxis with ciprofloxacin     Staph epidermidis bacteremia: 1 out of 4 bottles from 11/26/2023 positive for Staph epidermidis.  This is likely a contaminant.  Repeat blood cultures from 11/27/2023 has not shown any growth thus far.   Bacteroides  thetaiotaomicron bacteremia (blood cultures from 11/26/2023 growing gram-negative rods).   Continue metronidazole ID pharmacist agreed     Hematemesis: Resolved.  This is probably from multiple episodes of vomiting.   H&H is stable.     Elevated troponins: Troponin is 172, 138, 127 and 128.  No chest pain or acute EKG changes. Due to demand ischemia.     Paroxysmal atrial fibrillation, PVCs: Heart rate is better.  Continue metoprolol.  We discussed risks,  benefits and alternatives to long-term anticoagulation for atrial fibrillation. He is at increased risk for bleeding from long-term anticoagulation because of underlying severe liver and kidney disease. CHA2DS2-VASc score is 3 and he is also at increased risk for stroke. He wants to consider Eliquis for stroke prophylaxis. We will resume Eliquis when closer to discharge and clear no additional procedures needed     Chronic systolic CHF Moderate mitral regurgitation 2D echo showed EF estimated at 40 to 45%, moderate LVH, indeterminate left ventricular diastolic parameters, mildly reduced RV systolic function, moderately enlarged RV size, moderately dilated right atrium, small pericardial effusion, moderate mitral regurgitation --Volume management by dialysis   Confusion on 11/30/2023, likely acute toxic encephalopathy from oxycodone: Oxycodone has been discontinued. Continue delirium precaution     ESRD on hemodialysis MWF --Nephrology following for hemodialysis    Dysphagia: Speech therapist recommended dysphagia 2 diet, thin liquids.     General weakness: PT recommended discharge to SNF.   Follow-up with TOC to assist with placement.     Comorbidities include hypertension, hyperlipidemia, BPH, type II DM     Consultants: General surgery Interventional radiology   Procedures: Paracentesis with removal of 1,600 mL of fluid on 11/26/2023 Paracentesis with removal of 4.6 L of fluid on 12/01/2023    DVT prophylaxis: SCDs Start: 11/26/23 2029       Code Status: Full Code   Family Communication: None Disposition Plan:  SNF/rehab     Status is: Inpatient Remains inpatient appropriate because:  needs SNF placement - pending   Medically stable 12/05/23 for discharge.    Physical Exam: General exam: sleeping, wakes to  voice, no acute distress Respiratory system: normal respiratory effort.on room air, lungs clear Cardiovascular system: RRR, no edema, right upper chest perm cath  in place Gastrointestinal system: soft, NT, non-distended Central nervous system: no gross focal neurologic deficits Extremities: no edema, normal tone Psychiatry: normal mood, congruent affect     Subjective: Patient seen and examined at bedside this morning Patient continues to await placement  Currently denies nausea vomiting abdominal pain    Subjective:  Patient seen and examined at bedside this morning Denies nausea vomiting abdominal pain chest pain cough   Physical Exam: Vitals:   12/11/23 2023 12/12/23 0308 12/12/23 0841 12/12/23 1528  BP: 104/67 112/75 113/77 123/83  Pulse: 79 77 77 79  Resp: 16 16 18 19   Temp: 98.9 F (37.2 C) 98.7 F (37.1 C) 98.5 F (36.9 C) 98 F (36.7 C)  TempSrc: Oral Oral Oral   SpO2: 100% 100% 100% 100%  Weight:      Height:          Latest Ref Rng & Units 12/12/2023    5:44 AM 12/11/2023    4:15 AM 12/10/2023    4:43 AM  CBC  WBC 4.0 - 10.5 K/uL 4.5  3.8  4.4   Hemoglobin 13.0 - 17.0 g/dL 8.0  7.6  7.9   Hematocrit 39.0 - 52.0 % 24.6  23.0  24.3   Platelets 150 - 400 K/uL 122  123  137        Latest Ref Rng & Units 12/12/2023    5:44 AM 12/11/2023    4:15 AM 12/10/2023    4:43 AM  BMP  Glucose 70 - 99 mg/dL 88  025  427   BUN 8 - 23 mg/dL 14  22  15    Creatinine 0.61 - 1.24 mg/dL 0.62  3.76  2.83   Sodium 135 - 145 mmol/L 137  135  132   Potassium 3.5 - 5.1 mmol/L 3.7  3.5  3.6   Chloride 98 - 111 mmol/L 104  102  99   CO2 22 - 32 mmol/L 24  22  23    Calcium 8.9 - 10.3 mg/dL 8.1  7.9  7.8      Author: Loyce Dys, MD 12/12/2023 5:33 PM  For on call review [email or web address].

## 2023-12-12 NOTE — Progress Notes (Signed)
 Mobility Specialist - Progress Note   12/12/23 1100  Mobility  Activity Transferred from bed to chair;Ambulated with assistance in hallway  Level of Assistance Minimal assist, patient does 75% or more  Assistive Device Front wheel walker  Distance Ambulated (ft) 200 ft  Activity Response Tolerated well  Mobility visit 1 Mobility     Pt lying in bed upon arrival, utilizing RA. Pt pleasant throughout session and motivated. Completed bed mobility with HHA. STS with minG. VC for hand placement. Pt very impulsive; begins dancing and doing 360 turns immediately upon standing. Pt practiced transfers from bed-chair prior to ambulation in hallway. Obstacle course set-up for last 78' with 1 LOB when trying to avoid obstacles; corrected with CGA. Pt cued for slowing and pacing activity. Pt left in chair with alarm set, needs in reach.    Filiberto Pinks Mobility Specialist 12/12/23, 11:41 AM

## 2023-12-12 NOTE — Progress Notes (Signed)
 Initial Nutrition Assessment  DOCUMENTATION CODES:   Underweight  INTERVENTION:  - DYS 2 diet per SLP recs.  - Change to Ensure Plus High Protein po TID, each supplement provides 350 kcal and 20 grams of protein. - Encourage intake at all meals and of supplements.  - Add Rena-vit. - Monitor weight trends.   NUTRITION DIAGNOSIS:   Increased nutrient needs related to chronic illness as evidenced by estimated needs.  GOAL:   Patient will meet greater than or equal to 90% of their needs  MONITOR:   PO intake, Supplement acceptance, Labs, Weight trends  REASON FOR ASSESSMENT:   Consult Assessment of nutrition requirement/status  ASSESSMENT:   63 y.o. 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 Admitted for Spontaneous bacterial peritonitis.    2/13 Admit; paracentesis with 1.6L removed 2/14 SLP eval -> DYS 2 diet, SLP signed off 2/18 paracentesis with 4.6L removed  RD working remotely. Unable to reach patient via bedside telephone to obtain nutrition history.  Per EMR, weight stable from December to the end of January. Over the past 1 month, patient appears to have lost from 159# to 126#. This is a 33# or 21% weight loss in ~1 month, which is significant and severe. Patient noted to have had paracentesis twice this admission, which could have  affected weight status/changes.   Only 4 meals documented since admission, ranging from 50-100%. No meal intakes recorded since 2/24. Ordered Nepro but only occasionally consuming. Usually 0-1/x/day.   Patient noted to have refused all meds this AM.  He is currently pending SNF placement.    Admit weight: 149# Current weight: 126# I&O's: +3.9L since 2/15  Medications reviewed and include: Protonix  Labs reviewed:  Creatinine 4.07 HA1C 5.2  NUTRITION - FOCUSED PHYSICAL EXAM:  RD working  remotely  Diet Order:   Diet Order             DIET DYS 2 Fluid consistency: Thin  Diet effective now                   EDUCATION NEEDS:  No education needs have been identified at this time  Skin:  Skin Assessment: Skin Integrity Issues: Skin Integrity Issues:: Stage I Stage I: Mid Sacrum  Last BM:  3/1 - type 6  Height:  Ht Readings from Last 1 Encounters:  11/26/23 6' (1.829 m)   Weight:  Wt Readings from Last 1 Encounters:  12/11/23 57.3 kg   Ideal Body Weight:  80.91 kg  BMI:  Body mass index is 17.13 kg/m.  Estimated Nutritional Needs:  Kcal:  1850-2000 kcals Protein:  80-90 grams Fluid:  1L + UOP    Shelle Iron RD, LDN Contact via Secure Chat.

## 2023-12-13 DIAGNOSIS — K652 Spontaneous bacterial peritonitis: Secondary | ICD-10-CM | POA: Diagnosis not present

## 2023-12-13 LAB — BASIC METABOLIC PANEL
Anion gap: 10 (ref 5–15)
BUN: 21 mg/dL (ref 8–23)
CO2: 24 mmol/L (ref 22–32)
Calcium: 8.1 mg/dL — ABNORMAL LOW (ref 8.9–10.3)
Chloride: 101 mmol/L (ref 98–111)
Creatinine, Ser: 5.79 mg/dL — ABNORMAL HIGH (ref 0.61–1.24)
GFR, Estimated: 10 mL/min — ABNORMAL LOW (ref >60)
Glucose, Bld: 82 mg/dL (ref 70–99)
Potassium: 3.6 mmol/L (ref 3.5–5.1)
Sodium: 135 mmol/L (ref 135–145)

## 2023-12-13 LAB — CBC WITH DIFFERENTIAL/PLATELET
Abs Immature Granulocytes: 0.04 10*3/uL (ref 0.00–0.07)
Basophils Absolute: 0 10*3/uL (ref 0.0–0.1)
Basophils Relative: 1 %
Eosinophils Absolute: 0.1 10*3/uL (ref 0.0–0.5)
Eosinophils Relative: 1 %
HCT: 26.4 % — ABNORMAL LOW (ref 39.0–52.0)
Hemoglobin: 8.4 g/dL — ABNORMAL LOW (ref 13.0–17.0)
Immature Granulocytes: 1 %
Lymphocytes Relative: 19 %
Lymphs Abs: 0.9 10*3/uL (ref 0.7–4.0)
MCH: 28.8 pg (ref 26.0–34.0)
MCHC: 31.8 g/dL (ref 30.0–36.0)
MCV: 90.4 fL (ref 80.0–100.0)
Monocytes Absolute: 0.9 10*3/uL (ref 0.1–1.0)
Monocytes Relative: 20 %
Neutro Abs: 2.8 10*3/uL (ref 1.7–7.7)
Neutrophils Relative %: 58 %
Platelets: 129 10*3/uL — ABNORMAL LOW (ref 150–400)
RBC: 2.92 MIL/uL — ABNORMAL LOW (ref 4.22–5.81)
RDW: 16.4 % — ABNORMAL HIGH (ref 11.5–15.5)
WBC: 4.8 10*3/uL (ref 4.0–10.5)
nRBC: 0 % (ref 0.0–0.2)

## 2023-12-13 LAB — GLUCOSE, CAPILLARY
Glucose-Capillary: 88 mg/dL (ref 70–99)
Glucose-Capillary: 106 mg/dL — ABNORMAL HIGH (ref 70–99)
Glucose-Capillary: 129 mg/dL — ABNORMAL HIGH (ref 70–99)

## 2023-12-13 MED ORDER — INSULIN ASPART 100 UNIT/ML IJ SOLN: 0.0000 [IU] | Freq: Three times a day (TID) | INTRAMUSCULAR | Status: DC

## 2023-12-13 MED ORDER — CHLORHEXIDINE GLUCONATE CLOTH 2 % EX PADS
6.0000 | MEDICATED_PAD | Freq: Every day | CUTANEOUS | Status: DC
  Administered 2023-12-14: 6 via TOPICAL

## 2023-12-13 MED ORDER — APIXABAN 2.5 MG PO TABS
2.5000 mg | ORAL_TABLET | Freq: Two times a day (BID) | ORAL | Status: DC
  Administered 2023-12-13 – 2023-12-23 (×12): 2.5 mg via ORAL
  Filled 2023-12-13 (×16): qty 1

## 2023-12-13 NOTE — Plan of Care (Signed)
 Patient was given education regarding the need for lactulose to prevent hepatic encephalopathy.  Patient's pressure ulcer in the coccyx area is increasing in size and depth.  Patient refuses to wear a foam dressing, says it "hurts worse."  Educated patient as to how it would help protect the area from further ulceration.  He continues to refuse.

## 2023-12-13 NOTE — Plan of Care (Signed)

## 2023-12-13 NOTE — Progress Notes (Signed)
 [facility]  ROUNDING NOTE   Subjective:  Casey Franco is a 63 y.o. male with past medical history of HTN, ESRD, DM-2, and Hep C. Patient presents to emergency department with abdominal pain. He has been admitted for Intractable vomiting [R11.10] Abdominal pain [R10.9] Pneumonia of both lungs due to infectious organism, unspecified part of lung [J18.9] Sepsis without acute organ dysfunction, due to unspecified organism [facility]) [A41.9]   Patient is known to our practice and receives outpatient dialysis at [facility] on a MWF, last treatment on Tuesday.   Patient seen resting in bed. Patient reports ambulating yesterday in the hallways. Spirits remain high in hopes for discharge to facility that he favors. Patient denies shortness or breath, denies pain.   Objective:  Vital signs in last 24 hours:  Temp:  [98 F (36.7 C)-98.8 F (37.1 C)] 98 F (36.7 C) (03/02 0811) Pulse Rate:  [76-79] 76 (03/02 0811) Resp:  [16-19] 16 (03/02 0811) BP: (108-123)/(74-83) 108/74 (03/02 0811) SpO2:  [100 %] 100 % (03/01 2131)  Weight change:  Filed Weights   12/09/23 1211 12/11/23 0818 12/11/23 1133  Weight: 57.8 kg 56.7 kg 57.3 kg    Intake/Output: I/O last 3 completed shifts: In: 120 [P.O.:120] Out: -    Intake/Output this shift:  No intake/output data recorded.  Physical Exam: General: NAD,   Head: Normocephalic, atraumatic. Moist oral mucosal membranes  Eyes: Anicteric, PERRL  Neck: Supple, trachea midline  Lungs:  Clear to auscultation  Heart: Regular rate and rhythm  Abdomen:  Soft, nontender,   Extremities:  No peripheral edema.  Neurologic: Nonfocal, moving all four extremities  Skin: No lesions  Access: Rt chest permcath    Basic Metabolic Panel: Recent Labs  Lab 12/07/23 0608 12/09/23 0428 12/10/23 0443 12/11/23 0415 12/12/23 0544 12/13/23 0701  NA 134* 136 132* 135 137 135  K 3.4* 3.5 3.6 3.5 3.7 3.6  CL 101 102 99 102 104 101  CO2 21* 22  23 22 24 24   GLUCOSE 96 99 120* 103* 88 82  BUN 38* 28* 15 22 14 21   CREATININE 6.89* 6.26* 4.44* 5.63* 4.07* 5.79*  CALCIUM 7.8* 7.8* 7.8* 7.9* 8.1* 8.1*  PHOS 4.3 3.9  --   --   --   --     Liver Function Tests: Recent Labs  Lab 12/07/23 0608 12/09/23 0428  ALBUMIN 1.8* 1.9*   No results for input(s): "LIPASE", "AMYLASE" in the last 168 hours. No results for input(s): "AMMONIA" in the last 168 hours.  CBC: Recent Labs  Lab 12/09/23 0428 12/10/23 0443 12/11/23 0415 12/12/23 0544 12/13/23 0701  WBC 5.1 4.4 3.8* 4.5 4.8  NEUTROABS  --  2.5 2.2 2.7 2.8  HGB 7.9* 7.9* 7.6* 8.0* 8.4*  HCT 23.8* 24.3* 23.0* 24.6* 26.4*  MCV 89.8 91.4 90.2 92.5 90.4  PLT 136* 137* 123* 122* 129*    Cardiac Enzymes: No results for input(s): "CKTOTAL", "CKMB", "CKMBINDEX", "TROPONINI" in the last 168 hours.  BNP: Invalid input(s): "POCBNP"  CBG: Recent Labs  Lab 12/08/23 0903 12/09/23 0738 12/09/23 1230 12/09/23 1702 12/13/23 0835  GLUCAP 123* 86 76 104* 88    Microbiology: Results for orders placed or performed during the hospital encounter of 11/26/23  Culture, blood (Routine x 2)     Status: Abnormal   Collection Time: 11/26/23 11:22 AM   Specimen: BLOOD  Result Value Ref Range Status   Specimen Description   Final    BLOOD RIGHT ANTECUBITAL Performed at  [facility], [address] [place], [place] [address]    Special Requests   Final    BOTTLES DRAWN AEROBIC AND ANAEROBIC Blood Culture results may not be optimal due to an inadequate volume of blood received in culture bottles Performed at [facility], [address] [place], [place] [address]    Culture  Setup Time   Final    ANAEROBIC BOTTLE ONLY GRAM NEGATIVE RODS CRITICAL RESULT CALLED TO, READ BACK BY AND VERIFIED WITH: CAROLINE COULTER @ 11/28/23 2033 AB    Culture (A)  Final    BACTEROIDES THETAIOTAOMICRON BETA LACTAMASE POSITIVE Performed at [facility], [address]  [place], [place] [address]    Report Status 12/01/2023 FINAL  Final  Blood Culture ID Panel (Reflexed)     Status: None   Collection Time: 11/26/23 11:22 AM  Result Value Ref Range Status   Enterococcus faecalis NOT DETECTED NOT DETECTED Final   Enterococcus Faecium NOT DETECTED NOT DETECTED Final   Listeria monocytogenes NOT DETECTED NOT DETECTED Final   Staphylococcus species NOT DETECTED NOT DETECTED Final   Staphylococcus aureus (BCID) NOT DETECTED NOT DETECTED Final   Staphylococcus epidermidis NOT DETECTED NOT DETECTED Final   Staphylococcus lugdunensis NOT DETECTED NOT DETECTED Final   Streptococcus species NOT DETECTED NOT DETECTED Final   Streptococcus agalactiae NOT DETECTED NOT DETECTED Final   Streptococcus pneumoniae NOT DETECTED NOT DETECTED Final   Streptococcus pyogenes NOT DETECTED NOT DETECTED Final   A.calcoaceticus-baumannii NOT DETECTED NOT DETECTED Final   Bacteroides fragilis NOT DETECTED NOT DETECTED Final   Enterobacterales NOT DETECTED NOT DETECTED Final   Enterobacter cloacae complex NOT DETECTED NOT DETECTED Final   Escherichia coli NOT DETECTED NOT DETECTED Final   Klebsiella aerogenes NOT DETECTED NOT DETECTED Final   Klebsiella oxytoca NOT DETECTED NOT DETECTED Final   Klebsiella pneumoniae NOT DETECTED NOT DETECTED Final   Proteus species NOT DETECTED NOT DETECTED Final   Salmonella species NOT DETECTED NOT DETECTED Final   Serratia marcescens NOT DETECTED NOT DETECTED Final   Haemophilus influenzae NOT DETECTED NOT DETECTED Final   Neisseria meningitidis NOT DETECTED NOT DETECTED Final   Pseudomonas aeruginosa NOT DETECTED NOT DETECTED Final   Stenotrophomonas maltophilia NOT DETECTED NOT DETECTED Final   Candida albicans NOT DETECTED NOT DETECTED Final   Candida auris NOT DETECTED NOT DETECTED Final   Candida glabrata NOT DETECTED NOT DETECTED Final   Candida krusei NOT DETECTED NOT DETECTED Final   Candida parapsilosis NOT DETECTED NOT DETECTED Final    Candida tropicalis NOT DETECTED NOT DETECTED Final   Cryptococcus neoformans/gattii NOT DETECTED NOT DETECTED Final    Comment: Performed at [facility], [address] [place], [place] [address]  Culture, blood (Routine x 2)     Status: Abnormal   Collection Time: 11/26/23 12:23 PM   Specimen: BLOOD LEFT ARM  Result Value Ref Range Status   Specimen Description   Final    BLOOD LEFT ARM Performed at [facility], [address], [place], [place] [address]    Special Requests   Final    BOTTLES DRAWN AEROBIC AND ANAEROBIC Blood Culture results may not be optimal due to an inadequate volume of blood received in culture bottles Performed at [facility], [address] [place], [place] [address]    Culture  Setup Time   Final    GRAM POSITIVE COCCI AEROBIC BOTTLE ONLY CRITICAL RESULT CALLED TO, READ BACK BY AND VERIFIED WITH:  BRIANA ALLEY ON 11/27/23 AT 1107 QSD    Culture (A)  Final    STAPHYLOCOCCUS EPIDERMIDIS THE SIGNIFICANCE OF ISOLATING THIS ORGANISM FROM A SINGLE SET OF BLOOD CULTURES WHEN MULTIPLE SETS ARE DRAWN IS UNCERTAIN. PLEASE NOTIFY THE MICROBIOLOGY DEPARTMENT WITHIN ONE WEEK IF SPECIATION AND SENSITIVITIES ARE REQUIRED. Performed at [facility], [address], [place], [place] [address]    Report Status 11/29/2023 FINAL  Final  Blood Culture ID Panel (Reflexed)     Status: Abnormal   Collection Time: 11/26/23 12:23 PM  Result Value Ref Range Status   Enterococcus faecalis NOT DETECTED NOT DETECTED Final   Enterococcus Faecium NOT DETECTED NOT DETECTED Final   Listeria monocytogenes NOT DETECTED NOT DETECTED Final   Staphylococcus species DETECTED (A) NOT DETECTED Final    Comment: CRITICAL RESULT CALLED TO, READ BACK BY AND VERIFIED WITH: BRIANA ALLEY ON 11/27/23 AT 1107 QSD    Staphylococcus aureus (BCID) NOT DETECTED NOT DETECTED Final   Staphylococcus epidermidis DETECTED (A) NOT DETECTED Final    Comment: CRITICAL RESULT  CALLED TO, READ BACK BY AND VERIFIED WITH: BRIANA ALLEY ON 11/27/23 AT 1107 QSD    Staphylococcus lugdunensis NOT DETECTED NOT DETECTED Final   Streptococcus species NOT DETECTED NOT DETECTED Final   Streptococcus agalactiae NOT DETECTED NOT DETECTED Final   Streptococcus pneumoniae NOT DETECTED NOT DETECTED Final   Streptococcus pyogenes NOT DETECTED NOT DETECTED Final   A.calcoaceticus-baumannii NOT DETECTED NOT DETECTED Final   Bacteroides fragilis NOT DETECTED NOT DETECTED Final   Enterobacterales NOT DETECTED NOT DETECTED Final   Enterobacter cloacae complex NOT DETECTED NOT DETECTED Final   Escherichia coli NOT DETECTED NOT DETECTED Final   Klebsiella aerogenes NOT DETECTED NOT DETECTED Final   Klebsiella oxytoca NOT DETECTED NOT DETECTED Final   Klebsiella pneumoniae NOT DETECTED NOT DETECTED Final   Proteus species NOT DETECTED NOT DETECTED Final   Salmonella species NOT DETECTED NOT DETECTED Final   Serratia marcescens NOT DETECTED NOT DETECTED Final   Haemophilus influenzae NOT DETECTED NOT DETECTED Final   Neisseria meningitidis NOT DETECTED NOT DETECTED Final   Pseudomonas aeruginosa NOT DETECTED NOT DETECTED Final   Stenotrophomonas maltophilia NOT DETECTED NOT DETECTED Final   Candida albicans NOT DETECTED NOT DETECTED Final   Candida auris NOT DETECTED NOT DETECTED Final   Candida glabrata NOT DETECTED NOT DETECTED Final   Candida krusei NOT DETECTED NOT DETECTED Final   Candida parapsilosis NOT DETECTED NOT DETECTED Final   Candida tropicalis NOT DETECTED NOT DETECTED Final   Cryptococcus neoformans/gattii NOT DETECTED NOT DETECTED Final   Methicillin resistance mecA/C NOT DETECTED NOT DETECTED Final    Comment: Performed at [facility], [address] [place], [place] [address]  Body fluid culture w Gram Stain     Status: None   Collection Time: 11/26/23  8:55 PM   Specimen: PATH Cytology Peritoneal fluid  Result Value Ref Range Status   Specimen  Description   Final    PERITONEAL CYTO Performed at [facility], [address], [place], [place] [address]    Special Requests   Final    NONE Performed at [facility], [address] [place], [place] [address]    Gram Stain   Final    FEW WBC PRESENT,BOTH PMN AND MONONUCLEAR FEW GRAM POSITIVE COCCI IN CHAINS MODERATE GRAM NEGATIVE RODS FEW GRAM POSITIVE RODS Performed at [facility], [address], [place], [place] [address]    Culture  Final    MODERATE STREPTOCOCCUS MITIS/ORALIS RARE CITROBACTER AMALONATICUS    Report Status 11/30/2023 FINAL  Final   Organism ID, Bacteria STREPTOCOCCUS MITIS/ORALIS  Final   Organism ID, Bacteria CITROBACTER AMALONATICUS  Final      Susceptibility   Citrobacter amalonaticus - MIC*    CEFEPIME <=0.12 SENSITIVE Sensitive     CEFTAZIDIME <=1 SENSITIVE Sensitive     CEFTRIAXONE <=0.25 SENSITIVE Sensitive     CIPROFLOXACIN <=0.25 SENSITIVE Sensitive     GENTAMICIN <=1 SENSITIVE Sensitive     IMIPENEM <=0.25 SENSITIVE Sensitive     TRIMETH/SULFA <=20 SENSITIVE Sensitive     PIP/TAZO <=4 SENSITIVE Sensitive ug/mL    * RARE CITROBACTER AMALONATICUS   Streptococcus mitis/oralis - MIC*    PENICILLIN 0.25 INTERMEDIATE Intermediate     CEFTRIAXONE 0.25 SENSITIVE Sensitive     LEVOFLOXACIN 1 SENSITIVE Sensitive     VANCOMYCIN 0.5 SENSITIVE Sensitive     * MODERATE STREPTOCOCCUS MITIS/ORALIS  Culture, blood (Routine X 2) w Reflex to ID Panel     Status: None   Collection Time: 11/28/23  5:43 AM   Specimen: BLOOD LEFT ARM  Result Value Ref Range Status   Specimen Description BLOOD LEFT ARM  Final   Special Requests   Final    BOTTLES DRAWN AEROBIC AND ANAEROBIC Blood Culture results may not be optimal due to an inadequate volume of blood received in culture bottles   Culture   Final    NO GROWTH 5 DAYS Performed at [facility], [address] [place], [place] [address]    Report Status 12/03/2023 FINAL   Final  Culture, blood (Routine X 2) w Reflex to ID Panel     Status: None   Collection Time: 11/28/23  5:47 AM   Specimen: BLOOD RIGHT HAND  Result Value Ref Range Status   Specimen Description BLOOD RIGHT HAND  Final   Special Requests   Final    BOTTLES DRAWN AEROBIC AND ANAEROBIC Blood Culture results may not be optimal due to an inadequate volume of blood received in culture bottles   Culture   Final    NO GROWTH 5 DAYS Performed at [facility], [address], [place], [place] [address]    Report Status 12/03/2023 FINAL  Final  Body fluid culture w Gram Stain     Status: None   Collection Time: 12/01/23 10:10 AM   Specimen: Ascitic; Body Fluid  Result Value Ref Range Status   Specimen Description   Final    ASCITIC Performed at [facility], [address] [place], [place] [address]    Special Requests   Final    peri Performed at [facility], [address] [place], [place] [address]    Gram Stain   Final    FEW WBC PRESENT, PREDOMINANTLY PMN ABUNDANT GRAM NEGATIVE RODS ABUNDANT GRAM POSITIVE COCCI RARE GRAM POSITIVE RODS    Culture   Final    FEW STREPTOCOCCUS MITIS/ORALIS RARE CITROBACTER AMALONATICUS MODERATE BACTEROIDES THETAIOTAOMICRON BETA LACTAMASE POSITIVE RARE EIKENELLA CORRODENS Usually susceptible to penicillin and other beta lactam agents,quinolones,macrolides and tetracyclines. Performed at [facility], [address], [place], [place] [address]    Report Status 12/06/2023 FINAL  Final   Organism ID, Bacteria STREPTOCOCCUS MITIS/ORALIS  Final   Organism ID, Bacteria CITROBACTER AMALONATICUS  Final      Susceptibility   Citrobacter amalonaticus - MIC*    CEFEPIME <=0.12 SENSITIVE Sensitive     CEFTAZIDIME <=1  SENSITIVE Sensitive     CEFTRIAXONE <=0.25 SENSITIVE Sensitive     CIPROFLOXACIN <=0.25 SENSITIVE Sensitive     GENTAMICIN <=1 SENSITIVE Sensitive     IMIPENEM <=0.25 SENSITIVE Sensitive      TRIMETH/SULFA <=20 SENSITIVE Sensitive     PIP/TAZO <=4 SENSITIVE Sensitive ug/mL    * RARE CITROBACTER AMALONATICUS   Streptococcus mitis/oralis - MIC*    PENICILLIN <=0.06 SENSITIVE Sensitive     CEFTRIAXONE 0.25 SENSITIVE Sensitive     LEVOFLOXACIN 1 SENSITIVE Sensitive     VANCOMYCIN 0.5 SENSITIVE Sensitive     * FEW STREPTOCOCCUS MITIS/ORALIS    Coagulation Studies: No results for input(s): "LABPROT", "INR" in the last 72 hours.  Urinalysis: No results for input(s): "COLORURINE", "LABSPEC", "PHURINE", "GLUCOSEU", "HGBUR", "BILIRUBINUR", "KETONESUR", "PROTEINUR", "UROBILINOGEN", "NITRITE", "LEUKOCYTESUR" in the last 72 hours.  Invalid input(s): "APPERANCEUR"    Imaging: No results found.   Medications:     amLODipine  10 mg Oral Daily   Chlorhexidine Gluconate Cloth  6 each Topical Q0600   ciprofloxacin  250 mg Oral QHS   epoetin alfa-epbx (RETACRIT) injection  10,000 Units Intravenous Q M,W,F-HD   feeding supplement  237 mL Oral TID BM   irbesartan  300 mg Oral Daily   lactulose  20 g Oral BID   metoprolol tartrate  25 mg Oral BID   metroNIDAZOLE  500 mg Oral Q12H   multivitamin  1 tablet Oral QHS   pantoprazole  40 mg Oral Daily   tamsulosin  0.4 mg Oral Daily   acetaminophen, alum & mag hydroxide-simeth, antiseptic oral rinse, hydrALAZINE, hydrOXYzine, ondansetron (ZOFRAN) IV  Assessment/ Plan:  Casey Franco is a 63 y.o.  male with HTN, ESRD, DM-2, Hep C.  Patient presents with chest pain and has been admitted for Intractable vomiting [R11.10] Abdominal pain [R10.9] Pneumonia of both lungs due to infectious organism, unspecified part of lung [J18.9] Sepsis without acute organ dysfunction, due to unspecified organism [facility]) [A41.9]   [facility]/MWF/right chest PermCath/71.0 kg   End-stage renal disease on hemodialysis.  Monitoring discharge plans which includes rehab placement. Next dialysis scheduled for tomorrow.   2. Anemia of  chronic kidney disease Recent Labs           Lab Results  Component Value Date    HGB 8.4 (L) 12/13/2023      Hemoglobin 8  Continue prescribed Epogen with dialysis treatments.   3. Secondary Hyperparathyroidism: with outpatient labs: PTH 351, phosphorus 6.6, calcium 8.1 on 11/23/2023.    Recent Labs           Lab Results  Component Value Date    PTH 31 08/30/2018    CALCIUM 8.1 (L) 03/02//2025    PHOS 3.9 12/09/2023      Continue to monitor bone metabolism parameters.   4.  Hypertension with chronic kidney disease.  Home regimen includes furosemide, hydralazine, amlodipine, and irbesartan. Maintain the patient on amlodipine, Avapro, and metoprolol.             Blood pressure 108/74   5.  Cirrhosis/ascites, spontaneous bacterial peritonitis. Paracentesis from 11/27/2023 yielded 1.6 L of fluid.  Complex loculations suspected as more ascites present. Microbiology results show Staph epidermidis in the blood. Peritoneal fluid culture from 11/26/2023 shows Streptococcus and Citrobacter. Lactulose twice a day.  Paracentesis on 12/02/23 with 4.6L removed. Fluid cultures negative             Patient remains on levofloxacin, lactulose, and Flagyl.  6. Malnutrition  Patient reports unintentional weight loss of 30lbs in 2 months. Complaints of diarrhea, albumin 1.9  Appreciate RD recommendations  - DYS 2 diet per SLP recs.  - Change to Ensure Plus High Protein po TID, each supplement provides 350 kcal and 20 grams of protein. - Encourage intake at all meals and of supplements.  - Add Rena-vit. - Monitor weight trends.    LOS: 17 Kymorah Korf P Adren Dollins 3/2/202511:50 AM

## 2023-12-13 NOTE — Progress Notes (Signed)
 Progress Note   Patient: Casey Franco ZOX:[id number] DOB: April 29, 1961 DOA: 11/26/2023     17 DOS: the patient was seen and examined on 12/13/2023     Brief hospital course: Casey Franco is a 63 y.o. 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  He also complained of nausea, increasing abdominal distention and multiple episodes of vomiting.    Spontaneous bacterial peritonitis Abdominal pain Liver cirrhosis with ascites: S/p paracentesis with removal of 1600 mL of fluid on 11/26/2023.   Fluid positive for SBP (ascitic fluid total nucleated cells 1,326, percentage neutrophils was 68% and absolute PMN count 901) Ascitic fluid culture showed Streptococcus mitis and Citrobacter amalonaticus S/p repeat paracentesis on 12/01/2023 with removal of 4.6 L of fluid. Percentage neutrophil count was 100 and total nucleated cell count 4,687. He was evaluated by the general surgeon.  No indication for surgery at this time.   Has completed 2 weeks of antibiotics for SBP Currently on SBP prophylaxis with ciprofloxacin     Staph epidermidis bacteremia: 1 out of 4 bottles from 11/26/2023 positive for Staph epidermidis.  This is likely a contaminant.  Repeat blood cultures from 11/27/2023 has not shown any growth thus far.   Bacteroides  thetaiotaomicron bacteremia (blood cultures from 11/26/2023 growing gram-negative rods).   Continue metronidazole ID pharmacist agreed     Hematemesis: Resolved.  This is probably from multiple episodes of vomiting.   H&H is stable.     Elevated troponins: Troponin is 172, 138, 127 and 128.  No chest pain or acute EKG changes. Due to demand ischemia.     Paroxysmal atrial fibrillation, PVCs: Heart rate is better.  Continue metoprolol.  We discussed risks, benefits and alternatives to  long-term anticoagulation for atrial fibrillation. He is at increased risk for bleeding from long-term anticoagulation because of underlying severe liver and kidney disease. CHA2DS2-VASc score is 3 and he is also at increased risk for stroke. Patient has agreed to Eliquis Eliquis initiated as hemoglobin is stabilized   Chronic systolic CHF Moderate mitral regurgitation 2D echo showed EF estimated at 40 to 45%, moderate LVH, indeterminate left ventricular diastolic parameters, mildly reduced RV systolic function, moderately enlarged RV size, moderately dilated right atrium, small pericardial effusion, moderate mitral regurgitation --Volume management by dialysis   Confusion on 11/30/2023, likely acute toxic encephalopathy from oxycodone: Oxycodone has been discontinued. Continue delirium precaution     ESRD on hemodialysis MWF --Nephrology following for hemodialysis    Dysphagia: Speech therapist recommended dysphagia 2 diet, thin liquids.     General weakness: PT recommended discharge to SNF.   Follow-up with TOC to assist with placement.     Essential hypertension Continue current antihypertensives  History of type II DM Continue to monitor glucose Place on insulin therapy   Consultants: General surgery Interventional radiology   Procedures: Paracentesis with removal of 1,600 mL of fluid on 11/26/2023 Paracentesis with removal of 4.6 L of fluid on 12/01/2023    DVT prophylaxis: Eliquis       Code Status: Full Code   Family Communication: None  Disposition Plan:  SNF/rehab     Status is: Inpatient Remains inpatient appropriate because:  needs SNF placement - pending      Physical Exam: General exam: sleeping, wakes to voice, no acute distress Respiratory system: normal respiratory effort.on room air, lungs clear Cardiovascular  system: RRR, no edema, right upper chest perm cath in place Gastrointestinal system: soft, NT, non-distended Central nervous system: no  gross focal neurologic deficits Extremities: no edema, normal tone Psychiatry: normal mood, congruent affect     Subjective: Patient seen and examined at bedside this morning Denies nausea vomiting abdominal pain chest pain cough Continues to await placement    Data Reviewed:    Latest Ref Rng & Units 12/13/2023    7:01 AM 12/12/2023    5:44 AM 12/11/2023    4:15 AM  CBC  WBC 4.0 - 10.5 K/uL 4.8  4.5  3.8   Hemoglobin 13.0 - 17.0 g/dL 8.4  8.0  7.6   Hematocrit 39.0 - 52.0 % 26.4  24.6  23.0   Platelets 150 - 400 K/uL 129  122  123        Latest Ref Rng & Units 12/13/2023    7:01 AM 12/12/2023    5:44 AM 12/11/2023    4:15 AM  BMP  Glucose 70 - 99 mg/dL 82  88  161   BUN 8 - 23 mg/dL 21  14  22    Creatinine 0.61 - 1.24 mg/dL 0.96  0.45  4.09   Sodium 135 - 145 mmol/L 135  137  135   Potassium 3.5 - 5.1 mmol/L 3.6  3.7  3.5   Chloride 98 - 111 mmol/L 101  104  102   CO2 22 - 32 mmol/L 24  24  22    Calcium 8.9 - 10.3 mg/dL 8.1  8.1  7.9     Vitals:   12/12/23 0841 12/12/23 1528 12/12/23 2131 12/13/23 0811  BP: 113/77 123/83 114/77 108/74  Pulse: 77 79 79 76  Resp: 18 19 17 16   Temp: 98.5 F (36.9 C) 98 F (36.7 C) 98.8 F (37.1 C) 98 F (36.7 C)  TempSrc: Oral  Oral Oral  SpO2: 100% 100% 100%   Weight:      Height:       Disposition: Medically stable 12/05/23 for discharge.   Author: Loyce Dys, MD 12/13/2023 8:54 AM  For on call review [email or web address].

## 2023-12-14 DIAGNOSIS — N186 End stage renal disease: Secondary | ICD-10-CM | POA: Diagnosis not present

## 2023-12-14 DIAGNOSIS — K652 Spontaneous bacterial peritonitis: Secondary | ICD-10-CM | POA: Diagnosis not present

## 2023-12-14 DIAGNOSIS — R109 Unspecified abdominal pain: Secondary | ICD-10-CM | POA: Diagnosis not present

## 2023-12-14 LAB — BASIC METABOLIC PANEL
Anion gap: 13 (ref 5–15)
BUN: 27 mg/dL — ABNORMAL HIGH (ref 8–23)
CO2: 22 mmol/L (ref 22–32)
Calcium: 8.1 mg/dL — ABNORMAL LOW (ref 8.9–10.3)
Chloride: 100 mmol/L (ref 98–111)
Creatinine, Ser: 6.72 mg/dL — ABNORMAL HIGH (ref 0.61–1.24)
GFR, Estimated: 9 mL/min — ABNORMAL LOW (ref >60)
Glucose, Bld: 94 mg/dL (ref 70–99)
Potassium: 3.7 mmol/L (ref 3.5–5.1)
Sodium: 135 mmol/L (ref 135–145)

## 2023-12-14 LAB — CBC WITH DIFFERENTIAL/PLATELET
Abs Immature Granulocytes: 0.04 10*3/uL (ref 0.00–0.07)
Basophils Absolute: 0 10*3/uL (ref 0.0–0.1)
Basophils Relative: 1 %
Eosinophils Absolute: 0 10*3/uL (ref 0.0–0.5)
Eosinophils Relative: 1 %
HCT: 25 % — ABNORMAL LOW (ref 39.0–52.0)
Hemoglobin: 8.3 g/dL — ABNORMAL LOW (ref 13.0–17.0)
Immature Granulocytes: 1 %
Lymphocytes Relative: 15 %
Lymphs Abs: 0.7 10*3/uL (ref 0.7–4.0)
MCH: 29.9 pg (ref 26.0–34.0)
MCHC: 33.2 g/dL (ref 30.0–36.0)
MCV: 89.9 fL (ref 80.0–100.0)
Monocytes Absolute: 1 10*3/uL (ref 0.1–1.0)
Monocytes Relative: 20 %
Neutro Abs: 3.1 10*3/uL (ref 1.7–7.7)
Neutrophils Relative %: 62 %
Platelets: 130 10*3/uL — ABNORMAL LOW (ref 150–400)
RBC: 2.78 MIL/uL — ABNORMAL LOW (ref 4.22–5.81)
RDW: 16.1 % — ABNORMAL HIGH (ref 11.5–15.5)
WBC: 4.9 10*3/uL (ref 4.0–10.5)
nRBC: 0 % (ref 0.0–0.2)

## 2023-12-14 LAB — GLUCOSE, CAPILLARY
Glucose-Capillary: 87 mg/dL (ref 70–99)
Glucose-Capillary: 106 mg/dL — ABNORMAL HIGH (ref 70–99)
Glucose-Capillary: 133 mg/dL — ABNORMAL HIGH (ref 70–99)

## 2023-12-14 MED ORDER — EPOETIN ALFA-EPBX 10000 UNIT/ML IJ SOLN
INTRAMUSCULAR | Status: AC
  Filled 2023-12-14: qty 1

## 2023-12-14 MED ORDER — HEPARIN SODIUM (PORCINE) 1000 UNIT/ML DIALYSIS: 1000.0000 [IU] | INTRAMUSCULAR | Status: DC | PRN

## 2023-12-14 MED ORDER — ALTEPLASE 2 MG IJ SOLR: 2.0000 mg | Freq: Once | INTRAMUSCULAR | Status: DC | PRN

## 2023-12-14 MED ORDER — ANTICOAGULANT SODIUM CITRATE 4% (200MG/5ML) IV SOLN: 5.0000 mL | Status: DC | PRN

## 2023-12-14 NOTE — Progress Notes (Signed)
 Progress Note   Patient: Casey Franco:[id number] DOB: 10/28/1960 DOA: 11/26/2023     18 DOS: the patient was seen and examined on 12/14/2023       Brief hospital course: Casey Franco is a 63 y.o. 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  He also complained of nausea, increasing abdominal distention and multiple episodes of vomiting.    Spontaneous bacterial peritonitis Abdominal pain Liver cirrhosis with ascites: S/p paracentesis with removal of 1600 mL of fluid on 11/26/2023.   Fluid positive for SBP (ascitic fluid total nucleated cells 1,326, percentage neutrophils was 68% and absolute PMN count 901) Ascitic fluid culture showed Streptococcus mitis and Citrobacter amalonaticus S/p repeat paracentesis on 12/01/2023 with removal of 4.6 L of fluid. Percentage neutrophil count was 100 and total nucleated cell count 4,687. He was evaluated by the general surgeon.  No indication for surgery at this time.   Has completed 2 weeks of antibiotics for SBP Currently on SBP prophylaxis with ciprofloxacin     Staph epidermidis bacteremia: 1 out of 4 bottles from 11/26/2023 positive for Staph epidermidis.  This is likely a contaminant.  Repeat blood cultures from 11/27/2023 has not shown any growth thus far.   Bacteroides  thetaiotaomicron bacteremia (blood cultures from 11/26/2023 growing gram-negative rods).   Has completed 2 weeks of metronidazole as recommended by infectious disease on 12/14/2018 ID pharmacist agreed     Hematemesis: Resolved.  This is probably from multiple episodes of vomiting.   H&H is stable.     Elevated troponins: Troponin is 172, 138, 127 and 128.  No chest pain or acute EKG changes. Due to demand ischemia.     Paroxysmal atrial fibrillation, PVCs: Heart rate is better.   Continue metoprolol.  We discussed risks, benefits and alternatives to long-term anticoagulation for atrial fibrillation. He is at increased risk for bleeding from long-term anticoagulation because of underlying severe liver and kidney disease. CHA2DS2-VASc score is 3 and he is also at increased risk for stroke. Patient has agreed to Eliquis Eliquis initiated as hemoglobin is stabilized   Chronic systolic CHF Moderate mitral regurgitation 2D echo showed EF estimated at 40 to 45%, moderate LVH, indeterminate left ventricular diastolic parameters, mildly reduced RV systolic function, moderately enlarged RV size, moderately dilated right atrium, small pericardial effusion, moderate mitral regurgitation --Volume management by dialysis   Confusion on 11/30/2023, likely acute toxic encephalopathy from oxycodone: Oxycodone has been discontinued. Continue delirium precaution     ESRD on hemodialysis MWF --Nephrology following for hemodialysis    Dysphagia: Speech therapist recommended dysphagia 2 diet, thin liquids.     General weakness: PT recommended discharge to SNF.   Follow-up with TOC to assist with placement.     Essential hypertension Continue current antihypertensives   History of type II DM Continue to monitor glucose Place on insulin therapy   Consultants: General surgery Interventional radiology   Procedures: Paracentesis with removal of 1,600 mL of fluid on 11/26/2023 Paracentesis with removal of 4.6 L of fluid on 12/01/2023    DVT prophylaxis: Eliquis       Code Status: Full Code   Family Communication: None   Disposition Plan:  SNF/rehab     Status is: Inpatient Remains inpatient appropriate because:  needs SNF placement - pending       Physical Exam: General exam: sleeping,  wakes to voice, no acute distress Respiratory system: normal respiratory effort.on room air, lungs clear Cardiovascular system: RRR, no edema, right upper chest perm cath in  place Gastrointestinal system: soft, NT, non-distended Central nervous system: no gross focal neurologic deficits Extremities: no edema, normal tone Psychiatry: normal mood, congruent affect     Subjective: Patient seen and examined at bedside this morning Case discussed with TOC and apparently rehab facility director would like patient's dialysis catheter to be exchanged before patient is accepted Nephrologist is aware and vascular surgeon have been contacted who is planning to do this hopefully tomorrow. Dialysis catheter exchange is not an obstacle for discharge at this can be done as an outpatient     Data Reviewed:  Vitals:   12/14/23 1130 12/14/23 1200 12/14/23 1230 12/14/23 1320  BP: 130/83 122/83 133/82 131/84  Pulse: 77 79 78 79  Resp: 15 11 19 17   Temp:    97.8 F (36.6 C)  TempSrc:    Oral  SpO2: 100% 99% 100% 100%  Weight:    60.6 kg  Height:          Latest Ref Rng & Units 12/14/2023    4:18 AM 12/13/2023    7:01 AM 12/12/2023    5:44 AM  CBC  WBC 4.0 - 10.5 K/uL 4.9  4.8  4.5   Hemoglobin 13.0 - 17.0 g/dL 8.3  8.4  8.0   Hematocrit 39.0 - 52.0 % 25.0  26.4  24.6   Platelets 150 - 400 K/uL 130  129  122        Latest Ref Rng & Units 12/14/2023    4:18 AM 12/13/2023    7:01 AM 12/12/2023    5:44 AM  BMP  Glucose 70 - 99 mg/dL 94  82  88   BUN 8 - 23 mg/dL 27  21  14    Creatinine 0.61 - 1.24 mg/dL 8.24  2.35  3.61   Sodium 135 - 145 mmol/L 135  135  137   Potassium 3.5 - 5.1 mmol/L 3.7  3.6  3.7   Chloride 98 - 111 mmol/L 100  101  104   CO2 22 - 32 mmol/L 22  24  24    Calcium 8.9 - 10.3 mg/dL 8.1  8.1  8.1     Disposition: Medically stable 12/05/23 for discharge.    Author: Loyce Dys, MD 12/14/2023 4:40 PM  For on call review [email or web address].

## 2023-12-14 NOTE — Progress Notes (Signed)
 PT Cancellation Note  Patient Details Name: Casey Franco MRN: [id number] DOB: Nov 24, 1960   Cancelled Treatment:    Reason Eval/Treat Not Completed: Patient at procedure or test/unavailable (Pt OTF for HD today. Will attempt PT again at later date/time.)   1:25 PM, 12/14/23 Rosamaria Lints, PT, DPT Physical Therapist - [facility]  Outpatient Physical Therapy- Main [facility] [phone]     [name] 12/14/2023, 1:25 PM

## 2023-12-14 NOTE — H&P (View-Only) (Signed)
 Hospital Consult    Reason for Consult:  Dialysis Perma Catheter exchange.  Requesting Physician:  Malachi Carl NP MRN #:  [id number]  History of Present Illness: This is a 63 y.o. 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  Patient has noted to have a nonfunctioning right chest permacatheter.  Vascular surgery was consulted for permacatheter exchange.  Past Medical History:  Diagnosis Date   AKI (acute kidney injury) (HCC) 08/30/2018   Diabetes mellitus without complication (HCC)    no meds since weight loss   Dialysis patient (HCC)    Mon, Wed, Fri   Hyperkalemia 02/04/2021   Hypertension    Metabolic acidosis, increased anion gap 02/04/2021   Renal disorder     Past Surgical History:  Procedure Laterality Date   CATARACT EXTRACTION W/PHACO Right 10/08/2023   Procedure: CATARACT EXTRACTION PHACO AND INTRAOCULAR LENS PLACEMENT (IOC) RIGHT DIABETIC 12.19 01:08.8;  Surgeon: Estanislado Pandy, MD;  Location: [facility];  Service: Ophthalmology;  Laterality: Right;   CATARACT EXTRACTION W/PHACO Left 10/22/2023   Procedure: CATARACT EXTRACTION PHACO AND INTRAOCULAR LENS PLACEMENT (IOC) LEFT DIABETIC MALYUGIN;  Surgeon: Estanislado Pandy, MD;  Location: [facility];  Service: Ophthalmology;  Laterality: Left;  7.66 0:54.2   COLONOSCOPY WITH PROPOFOL N/A 11/21/2021   Procedure: COLONOSCOPY WITH PROPOFOL;  Surgeon: Toney Reil, MD;  Location: [facility] END[facility];  Service: Gastroenterology;  Laterality: N/A;   ESOPHAGOGASTRODUODENOSCOPY (EGD) WITH PROPOFOL N/A 11/21/2021   Procedure: ESOPHAGOGASTRODUODENOSCOPY (EGD) WITH PROPOFOL;  Surgeon: Toney Reil, MD;  Location: [facility] END[facility]COPY;  Service: Gastroenterology;  Laterality: N/A;   INSERTION OF  DIALYSIS CATHETER     LEG SURGERY  1987    No Known Allergies  Prior to Admission medications   Medication Sig Start Date End Date Taking? Authorizing Provider  amLODipine (NORVASC) 10 MG tablet Take 1 tablet by mouth daily. 05/18/20  Yes [provider]  calcium acetate (PHOSLO) 667 MG capsule Take 1,334 mg by mouth 3 (three) times a week. 02/04/21  Yes [provider]  furosemide (LASIX) 80 MG tablet Take 80 mg by mouth daily. 01/25/21  Yes [provider]  hydrALAZINE (APRESOLINE) 25 MG tablet Take 25 mg by mouth 3 (three) times daily. 01/25/21  Yes [provider]  irbesartan (AVAPRO) 300 MG tablet Take 300 mg by mouth daily. 02/04/21  Yes [provider]  lactulose (CHRONULAC) 10 GM/15ML solution Take 20 g by mouth 2 (two) times daily. 11/20/23  Yes [provider]  Multiple Vitamin (MULTIVITAMIN) tablet Take 1 tablet by mouth daily.   Yes [provider]  senna-docusate (SENOKOT-S) 8.6-50 MG tablet Take 2 tablets by mouth 2 (two) times daily. 11/12/23  Yes Sharman Cheek, MD  tamsulosin (FLOMAX) 0.4 MG CAPS capsule Take 0.4 mg by mouth daily. 01/30/21  Yes [provider]  albumin human 25 % bottle Inject 25 grams no matter what if more then 5L are removed then another 25 grams 11/20/23   Toney Reil, MD  Ascorbic Acid (VITAMIN C PO) Take by mouth. Patient not taking: Reported on 11/26/2023    [provider]  blood glucose meter kit and supplies KIT Dispense based on patient and insurance preference. Use up to four times daily as directed. (FOR ICD-9 250.00, 250.01). 09/01/18   Auburn Bilberry, MD  polyethylene glycol  powder (GLYCOLAX/MIRALAX) 17 GM/SCOOP powder 1 cap full in a full glass of water, two times a day for 3 days. Patient not taking: Reported on 11/26/2023 11/12/23   Sharman Cheek, MD  VITAMIN E PO Take by mouth.    [provider]    Social History   Socioeconomic History    Marital status: Single    Spouse name: Not on file   Number of children: Not on file   Years of education: Not on file   Highest education level: Not on file  Occupational History   Not on file  Tobacco Use   Smoking status: Former    Types: Cigarettes   Smokeless tobacco: Never  Vaping Use   Vaping status: Never Used  Substance and Sexual Activity   Alcohol use: Yes    Alcohol/week: 7.0 standard drinks of alcohol    Types: 7 Cans of beer per week   Drug use: Not Currently    Types: Cocaine, Marijuana    Comment: 09/24/23 - Marijuana 3x/week. No cocaine since Friday 08/27/2018   Sexual activity: Not on file  Other Topics Concern   Not on file  Social History Narrative   Not on file   Social Drivers of Health   Financial Resource Strain: Low Risk  (05/15/2020)   Received from [facility]   Overall Financial Resource Strain (CARDIA)    Difficulty of Paying Living Expenses: Not very hard  Food Insecurity: Patient Declined (11/28/2023)   Hunger Vital Sign    Worried About Running Out of Food in the Last Year: Patient declined    Ran Out of Food in the Last Year: Patient declined  Transportation Needs: Patient Declined (11/28/2023)   PRAPARE - Administrator, Civil Service (Medical): Patient declined    Lack of Transportation (Non-Medical): Patient declined  Physical Activity: Not on file  Stress: Not on file  Social Connections: Not on file  Intimate Partner Violence: Patient Unable To Answer (11/28/2023)   Humiliation, Afraid, Rape, and Kick questionnaire    Fear of Current or Ex-Partner: Patient unable to answer    Emotionally Abused: Patient unable to answer    Physically Abused: Patient unable to answer    Sexually Abused: Patient unable to answer     Family History  Problem Relation Age of Onset   Diabetes Mellitus II Sister    Diabetes Mellitus II Paternal Grandmother     ROS: Otherwise negative unless mentioned in HPI  Physical  Examination  Vitals:   12/14/23 1230 12/14/23 1320  BP: 133/82 131/84  Pulse: 78 79  Resp: 19 17  Temp:  97.8 F (36.6 C)  SpO2: 100% 100%   Body mass index is 18.12 kg/m.  General:  WDWN in NAD Gait: Not observed HENT: WNL, normocephalic Pulmonary: normal non-labored breathing, without Rales, rhonchi,  wheezing Cardiac: regular, without  Murmurs, rubs or gallops; without carotid bruits Abdomen: Positive bowel sounds throughout, soft, NT/ND, no masses Skin: without rashes Vascular Exam/Pulses: Palpable pulses throughout. Extremities: without ischemic changes, without Gangrene , without cellulitis; without open wounds;  Musculoskeletal: no muscle wasting or atrophy  Neurologic: A&O X 3;  No focal weakness or paresthesias are detected; speech is fluent/normal Psychiatric:  The pt has Normal affect. Lymph:  Unremarkable  CBC    Component Value Date/Time   WBC 4.9 12/14/2023 0418   RBC 2.78 (L) 12/14/2023 0418   HGB 8.3 (L) 12/14/2023 0418   HGB 9.0 (L) 11/04/2021 1319  HCT 25.0 (L) 12/14/2023 0418   HCT 25.9 (L) 11/04/2021 1319   PLT 130 (L) 12/14/2023 0418   PLT 145 (L) 11/04/2021 1319   MCV 89.9 12/14/2023 0418   MCV 92 11/04/2021 1319   MCH 29.9 12/14/2023 0418   MCHC 33.2 12/14/2023 0418   RDW 16.1 (H) 12/14/2023 0418   RDW 14.2 11/04/2021 1319   LYMPHSABS 0.7 12/14/2023 0418   MONOABS 1.0 12/14/2023 0418   EOSABS 0.0 12/14/2023 0418   BASOSABS 0.0 12/14/2023 0418    BMET    Component Value Date/Time   NA 135 12/14/2023 0418   NA 142 11/04/2021 1319   K 3.7 12/14/2023 0418   CL 100 12/14/2023 0418   CO2 22 12/14/2023 0418   GLUCOSE 94 12/14/2023 0418   BUN 27 (H) 12/14/2023 0418   BUN 16 11/04/2021 1319   CREATININE 6.72 (H) 12/14/2023 0418   CALCIUM 8.1 (L) 12/14/2023 0418   GFRNONAA 9 (L) 12/14/2023 0418   GFRAA 15 (L) 09/01/2018 0412    COAGS: Lab Results  Component Value Date   INR 1.9 (H) 11/26/2023     Non-Invasive Vascular Imaging:    None  Statin:  Yes.   Beta Blocker:  No. Aspirin:  No. ACEI:  No. ARB:  Yes.   CCB use:  Yes Other antiplatelets/anticoagulants:  No.    ASSESSMENT/PLAN: This is a 63 y.o. male who presents to [facility] emergency department with 3 weeks of abdominal pain and intractable vomiting.  Patient is noted to have a nonfunctioning dialysis permacatheter in place.  Patient requires permacatheter exchange.  I had a long detailed discussion with the patient this afternoon at the bedside in hemodialysis.  We discussed the procedure, benefits, risk, and complications.  He verbalizes understanding and wishes to proceed to soon as possible.  I answered all the patient's questions.  Patient will be made n.p.o. after midnight.   -I discussed the case in detail with Dr. Levora Dredge MD and he agrees with the plan   Marcie Bal [facility] 12/14/2023 1:44 PM

## 2023-12-14 NOTE — Progress Notes (Signed)
 Hemodialysis Note:  Received patient in bed to unit. Alert and oriented. Informed consent singed and in chart.  Treatment initiated: 1014 Treatment completed: 1320  Access used: Right internal jugular catheter Access issues: None  Patient tolerated well. Transported back to room, alert without acute distress. Report given to patient's RN.  Total UF removed: 0 Medications given: Retacrit 57846 units IV  Post HD weight: 60.6 Kg  [facility]

## 2023-12-14 NOTE — TOC Progression Note (Signed)
 Transition of Care [facility]) - Progression Note    Patient Details  Name: Casey Franco MRN: [id number] Date of Birth: [birth year]  Transition of Care [facility]) CM/SW Contact  Chapman Fitch, RN Phone Number: 12/14/2023, 2:36 PM  Clinical Narrative:     Updated Clinical faxed to [facility] Per MD will be on oral antibiotics, and plan for HD cath to be changed tomorrow        Expected Discharge Plan and Services                                               Social Determinants of Health (SDOH) Interventions SDOH Screenings   Food Insecurity: Patient Declined (11/28/2023)  Housing: Low Risk  (12/06/2023)  Transportation Needs: Patient Declined (11/28/2023)  Utilities: Not At Risk (11/28/2023)  Financial Resource Strain: Low Risk  (05/15/2020)   Received from [facility]  Tobacco Use: Medium Risk (11/26/2023)    Readmission Risk Interventions     No data to display

## 2023-12-14 NOTE — Progress Notes (Signed)
 OT Cancellation Note  Patient Details Name: Casey Franco MRN: [id number] DOB: 01-03-1961   Cancelled Treatment:    Reason Eval/Treat Not Completed: Other (comment). Pt refused treatment prior to his dialysis treatment stating "I just ate, I need my food to settle and I don't want you to wear me out before dialysis." Pt agreeable to early treatment session tomorrow AM if possible. Pt taken down to dialysis as OT exiting the room.  Constance Goltz 12/14/2023, 9:49 AM

## 2023-12-14 NOTE — Consult Note (Signed)
 Hospital Consult    Reason for Consult:  Dialysis Perma Catheter exchange.  Requesting Physician:  Malachi Carl NP MRN #:  [id number]  History of Present Illness: This is a 63 y.o. 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  Patient has noted to have a nonfunctioning right chest permacatheter.  Vascular surgery was consulted for permacatheter exchange.  Past Medical History:  Diagnosis Date   AKI (acute kidney injury) (HCC) 08/30/2018   Diabetes mellitus without complication (HCC)    no meds since weight loss   Dialysis patient (HCC)    Mon, Wed, Fri   Hyperkalemia 02/04/2021   Hypertension    Metabolic acidosis, increased anion gap 02/04/2021   Renal disorder     Past Surgical History:  Procedure Laterality Date   CATARACT EXTRACTION W/PHACO Right 10/08/2023   Procedure: CATARACT EXTRACTION PHACO AND INTRAOCULAR LENS PLACEMENT (IOC) RIGHT DIABETIC 12.19 01:08.8;  Surgeon: Estanislado Pandy, MD;  Location: [facility];  Service: Ophthalmology;  Laterality: Right;   CATARACT EXTRACTION W/PHACO Left 10/22/2023   Procedure: CATARACT EXTRACTION PHACO AND INTRAOCULAR LENS PLACEMENT (IOC) LEFT DIABETIC MALYUGIN;  Surgeon: Estanislado Pandy, MD;  Location: [facility];  Service: Ophthalmology;  Laterality: Left;  7.66 0:54.2   COLONOSCOPY WITH PROPOFOL N/A 11/21/2021   Procedure: COLONOSCOPY WITH PROPOFOL;  Surgeon: Toney Reil, MD;  Location: [facility] END[facility];  Service: Gastroenterology;  Laterality: N/A;   ESOPHAGOGASTRODUODENOSCOPY (EGD) WITH PROPOFOL N/A 11/21/2021   Procedure: ESOPHAGOGASTRODUODENOSCOPY (EGD) WITH PROPOFOL;  Surgeon: Toney Reil, MD;  Location: [facility] ENDOSCOPY;  Service: Gastroenterology;  Laterality: N/A;   INSERTION OF  DIALYSIS CATHETER     LEG SURGERY  1987    No Known Allergies  Prior to Admission medications   Medication Sig Start Date End Date Taking? Authorizing Provider  amLODipine (NORVASC) 10 MG tablet Take 1 tablet by mouth daily. 05/18/20  Yes [provider]  calcium acetate (PHOSLO) 667 MG capsule Take 1,334 mg by mouth 3 (three) times a week. 02/04/21  Yes [provider]  furosemide (LASIX) 80 MG tablet Take 80 mg by mouth daily. 01/25/21  Yes [provider]  hydrALAZINE (APRESOLINE) 25 MG tablet Take 25 mg by mouth 3 (three) times daily. 01/25/21  Yes [provider]  irbesartan (AVAPRO) 300 MG tablet Take 300 mg by mouth daily. 02/04/21  Yes [provider]  lactulose (CHRONULAC) 10 GM/15ML solution Take 20 g by mouth 2 (two) times daily. 11/20/23  Yes [provider]  Multiple Vitamin (MULTIVITAMIN) tablet Take 1 tablet by mouth daily.   Yes [provider]  senna-docusate (SENOKOT-S) 8.6-50 MG tablet Take 2 tablets by mouth 2 (two) times daily. 11/12/23  Yes Sharman Cheek, MD  tamsulosin (FLOMAX) 0.4 MG CAPS capsule Take 0.4 mg by mouth daily. 01/30/21  Yes [provider]  albumin human 25 % bottle Inject 25 grams no matter what if more then 5L are removed then another 25 grams 11/20/23   Toney Reil, MD  Ascorbic Acid (VITAMIN C PO) Take by mouth. Patient not taking: Reported on 11/26/2023    [provider]  blood glucose meter kit and supplies KIT Dispense based on patient and insurance preference. Use up to four times daily as directed. (FOR ICD-9 250.00, 250.01). 09/01/18   Auburn Bilberry, MD  polyethylene glycol  powder (GLYCOLAX/MIRALAX) 17 GM/SCOOP powder 1 cap full in a full glass of water, two times a day for 3 days. Patient not taking: Reported on 11/26/2023 11/12/23   Sharman Cheek, MD  VITAMIN E PO Take by mouth.    [provider]    Social History   Socioeconomic History    Marital status: Single    Spouse name: Not on file   Number of children: Not on file   Years of education: Not on file   Highest education level: Not on file  Occupational History   Not on file  Tobacco Use   Smoking status: Former    Types: Cigarettes   Smokeless tobacco: Never  Vaping Use   Vaping status: Never Used  Substance and Sexual Activity   Alcohol use: Yes    Alcohol/week: 7.0 standard drinks of alcohol    Types: 7 Cans of beer per week   Drug use: Not Currently    Types: Cocaine, Marijuana    Comment: 09/24/23 - Marijuana 3x/week. No cocaine since Friday 08/27/2018   Sexual activity: Not on file  Other Topics Concern   Not on file  Social History Narrative   Not on file   Social Drivers of Health   Financial Resource Strain: Low Risk  (05/15/2020)   Received from [facility]   Overall Financial Resource Strain (CARDIA)    Difficulty of Paying Living Expenses: Not very hard  Food Insecurity: Patient Declined (11/28/2023)   Hunger Vital Sign    Worried About Running Out of Food in the Last Year: Patient declined    Ran Out of Food in the Last Year: Patient declined  Transportation Needs: Patient Declined (11/28/2023)   PRAPARE - Administrator, Civil Service (Medical): Patient declined    Lack of Transportation (Non-Medical): Patient declined  Physical Activity: Not on file  Stress: Not on file  Social Connections: Not on file  Intimate Partner Violence: Patient Unable To Answer (11/28/2023)   Humiliation, Afraid, Rape, and Kick questionnaire    Fear of Current or Ex-Partner: Patient unable to answer    Emotionally Abused: Patient unable to answer    Physically Abused: Patient unable to answer    Sexually Abused: Patient unable to answer     Family History  Problem Relation Age of Onset   Diabetes Mellitus II Sister    Diabetes Mellitus II Paternal Grandmother     ROS: Otherwise negative unless mentioned in HPI  Physical  Examination  Vitals:   12/14/23 1230 12/14/23 1320  BP: 133/82 131/84  Pulse: 78 79  Resp: 19 17  Temp:  97.8 F (36.6 C)  SpO2: 100% 100%   Body mass index is 18.12 kg/m.  General:  WDWN in NAD Gait: Not observed HENT: WNL, normocephalic Pulmonary: normal non-labored breathing, without Rales, rhonchi,  wheezing Cardiac: regular, without  Murmurs, rubs or gallops; without carotid bruits Abdomen: Positive bowel sounds throughout, soft, NT/ND, no masses Skin: without rashes Vascular Exam/Pulses: Palpable pulses throughout. Extremities: without ischemic changes, without Gangrene , without cellulitis; without open wounds;  Musculoskeletal: no muscle wasting or atrophy  Neurologic: A&O X 3;  No focal weakness or paresthesias are detected; speech is fluent/normal Psychiatric:  The pt has Normal affect. Lymph:  Unremarkable  CBC    Component Value Date/Time   WBC 4.9 12/14/2023 0418   RBC 2.78 (L) 12/14/2023 0418   HGB 8.3 (L) 12/14/2023 0418   HGB 9.0 (L) 11/04/2021 1319  HCT 25.0 (L) 12/14/2023 0418   HCT 25.9 (L) 11/04/2021 1319   PLT 130 (L) 12/14/2023 0418   PLT 145 (L) 11/04/2021 1319   MCV 89.9 12/14/2023 0418   MCV 92 11/04/2021 1319   MCH 29.9 12/14/2023 0418   MCHC 33.2 12/14/2023 0418   RDW 16.1 (H) 12/14/2023 0418   RDW 14.2 11/04/2021 1319   LYMPHSABS 0.7 12/14/2023 0418   MONOABS 1.0 12/14/2023 0418   EOSABS 0.0 12/14/2023 0418   BASOSABS 0.0 12/14/2023 0418    BMET    Component Value Date/Time   NA 135 12/14/2023 0418   NA 142 11/04/2021 1319   K 3.7 12/14/2023 0418   CL 100 12/14/2023 0418   CO2 22 12/14/2023 0418   GLUCOSE 94 12/14/2023 0418   BUN 27 (H) 12/14/2023 0418   BUN 16 11/04/2021 1319   CREATININE 6.72 (H) 12/14/2023 0418   CALCIUM 8.1 (L) 12/14/2023 0418   GFRNONAA 9 (L) 12/14/2023 0418   GFRAA 15 (L) 09/01/2018 0412    COAGS: Lab Results  Component Value Date   INR 1.9 (H) 11/26/2023     Non-Invasive Vascular Imaging:    None  Statin:  Yes.   Beta Blocker:  No. Aspirin:  No. ACEI:  No. ARB:  Yes.   CCB use:  Yes Other antiplatelets/anticoagulants:  No.    ASSESSMENT/PLAN: This is a 63 y.o. male who presents to [facility] emergency department with 3 weeks of abdominal pain and intractable vomiting.  Patient is noted to have a nonfunctioning dialysis permacatheter in place.  Patient requires permacatheter exchange.  I had a long detailed discussion with the patient this afternoon at the bedside in hemodialysis.  We discussed the procedure, benefits, risk, and complications.  He verbalizes understanding and wishes to proceed to soon as possible.  I answered all the patient's questions.  Patient will be made n.p.o. after midnight.   -I discussed the case in detail with Dr. Levora Dredge MD and he agrees with the plan   Marcie Bal [facility] 12/14/2023 1:44 PM

## 2023-12-14 NOTE — Progress Notes (Addendum)
 [facility]  ROUNDING NOTE   Subjective:   Casey Franco is a 63 y.o. male with past medical history of HTN, ESRD, DM-2, and Hep C. Patient presents to emergency department with abdominal pain. He has been admitted for Intractable vomiting [R11.10] Abdominal pain [R10.9] Pneumonia of both lungs due to infectious organism, unspecified part of lung [J18.9] Sepsis without acute organ dysfunction, due to unspecified organism [facility]) [A41.9]  Patient is known to our practice and receives outpatient dialysis at [facility] on a MWF, last treatment on Tuesday.    Update Patient seen and evaluated during dialysis   HEMODIALYSIS FLOWSHEET:  Blood Flow Rate (mL/min): 399 mL/min Arterial Pressure (mmHg): -176.15 mmHg Venous Pressure (mmHg): 277.77 mmHg TMP (mmHg): 0.61 mmHg Ultrafiltration Rate (mL/min): 267 mL/min Dialysate Flow Rate (mL/min): 300 ml/min  Denies pain or discomfort    Objective:  Vital signs in last 24 hours:  Temp:  [98.1 F (36.7 C)-98.8 F (37.1 C)] 98.1 F (36.7 C) (03/03 1001) Pulse Rate:  [71-77] 77 (03/03 1130) Resp:  [14-20] 15 (03/03 1130) BP: (96-130)/(72-83) 130/83 (03/03 1130) SpO2:  [99 %-100 %] 100 % (03/03 1130) Weight:  [60.6 kg] 60.6 kg (03/03 1001)  Weight change:  Filed Weights   12/11/23 0818 12/11/23 1133 12/14/23 1001  Weight: 56.7 kg 57.3 kg 60.6 kg    Intake/Output: I/O last 3 completed shifts: In: 90 [P.O.:90] Out: -    Intake/Output this shift:  No intake/output data recorded.  Physical Exam: General: NAD  Head: Normocephalic, atraumatic. Moist oral mucosal membranes  Eyes: Anicteric  Lungs:  Clear to auscultation, normal effort  Heart: Regular rate and rhythm, aflutter  Abdomen:  Soft, tender, mild distension  Extremities: No peripheral edema.  Neurologic: Alert and oriented, moving all four extremities  Skin: No lesions  Access: Right chest tunneled catheter    Basic Metabolic Panel: Recent Labs   Lab 12/09/23 0428 12/10/23 0443 12/11/23 0415 12/12/23 0544 12/13/23 0701 12/14/23 0418  NA 136 132* 135 137 135 135  K 3.5 3.6 3.5 3.7 3.6 3.7  CL 102 99 102 104 101 100  CO2 22 23 22 24 24 22   GLUCOSE 99 120* 103* 88 82 94  BUN 28* 15 22 14 21  27*  CREATININE 6.26* 4.44* 5.63* 4.07* 5.79* 6.72*  CALCIUM 7.8* 7.8* 7.9* 8.1* 8.1* 8.1*  PHOS 3.9  --   --   --   --   --     Liver Function Tests: Recent Labs  Lab 12/09/23 0428  ALBUMIN 1.9*   No results for input(s): "LIPASE", "AMYLASE" in the last 168 hours.  No results for input(s): "AMMONIA" in the last 168 hours.   CBC: Recent Labs  Lab 12/10/23 0443 12/11/23 0415 12/12/23 0544 12/13/23 0701 12/14/23 0418  WBC 4.4 3.8* 4.5 4.8 4.9  NEUTROABS 2.5 2.2 2.7 2.8 3.1  HGB 7.9* 7.6* 8.0* 8.4* 8.3*  HCT 24.3* 23.0* 24.6* 26.4* 25.0*  MCV 91.4 90.2 92.5 90.4 89.9  PLT 137* 123* 122* 129* 130*    Cardiac Enzymes: No results for input(s): "CKTOTAL", "CKMB", "CKMBINDEX", "TROPONINI" in the last 168 hours.  BNP: Invalid input(s): "POCBNP"  CBG: Recent Labs  Lab 12/09/23 1702 12/13/23 0835 12/13/23 1639 12/13/23 2056 12/14/23 0800  GLUCAP 104* 88 106* 129* 87    Microbiology: Results for orders placed or performed during the hospital encounter of 11/26/23  Culture, blood (Routine x 2)     Status: Abnormal   Collection Time: 11/26/23 11:22  AM   Specimen: BLOOD  Result Value Ref Range Status   Specimen Description   Final    BLOOD RIGHT ANTECUBITAL Performed at [facility], [address] [place], [place] [address]    Special Requests   Final    BOTTLES DRAWN AEROBIC AND ANAEROBIC Blood Culture results may not be optimal due to an inadequate volume of blood received in culture bottles Performed at [facility], [address] [place], [place] [address]    Culture  Setup Time   Final    ANAEROBIC BOTTLE ONLY GRAM NEGATIVE RODS CRITICAL RESULT CALLED TO, READ BACK BY AND VERIFIED  WITH: CAROLINE COULTER @ 11/28/23 2033 AB    Culture (A)  Final    BACTEROIDES THETAIOTAOMICRON BETA LACTAMASE POSITIVE Performed at [facility], [address] [place], [place] [address]    Report Status 12/01/2023 FINAL  Final  Blood Culture ID Panel (Reflexed)     Status: None   Collection Time: 11/26/23 11:22 AM  Result Value Ref Range Status   Enterococcus faecalis NOT DETECTED NOT DETECTED Final   Enterococcus Faecium NOT DETECTED NOT DETECTED Final   Listeria monocytogenes NOT DETECTED NOT DETECTED Final   Staphylococcus species NOT DETECTED NOT DETECTED Final   Staphylococcus aureus (BCID) NOT DETECTED NOT DETECTED Final   Staphylococcus epidermidis NOT DETECTED NOT DETECTED Final   Staphylococcus lugdunensis NOT DETECTED NOT DETECTED Final   Streptococcus species NOT DETECTED NOT DETECTED Final   Streptococcus agalactiae NOT DETECTED NOT DETECTED Final   Streptococcus pneumoniae NOT DETECTED NOT DETECTED Final   Streptococcus pyogenes NOT DETECTED NOT DETECTED Final   A.calcoaceticus-baumannii NOT DETECTED NOT DETECTED Final   Bacteroides fragilis NOT DETECTED NOT DETECTED Final   Enterobacterales NOT DETECTED NOT DETECTED Final   Enterobacter cloacae complex NOT DETECTED NOT DETECTED Final   Escherichia coli NOT DETECTED NOT DETECTED Final   Klebsiella aerogenes NOT DETECTED NOT DETECTED Final   Klebsiella oxytoca NOT DETECTED NOT DETECTED Final   Klebsiella pneumoniae NOT DETECTED NOT DETECTED Final   Proteus species NOT DETECTED NOT DETECTED Final   Salmonella species NOT DETECTED NOT DETECTED Final   Serratia marcescens NOT DETECTED NOT DETECTED Final   Haemophilus influenzae NOT DETECTED NOT DETECTED Final   Neisseria meningitidis NOT DETECTED NOT DETECTED Final   Pseudomonas aeruginosa NOT DETECTED NOT DETECTED Final   Stenotrophomonas maltophilia NOT DETECTED NOT DETECTED Final   Candida albicans NOT DETECTED NOT DETECTED Final   Candida auris NOT  DETECTED NOT DETECTED Final   Candida glabrata NOT DETECTED NOT DETECTED Final   Candida krusei NOT DETECTED NOT DETECTED Final   Candida parapsilosis NOT DETECTED NOT DETECTED Final   Candida tropicalis NOT DETECTED NOT DETECTED Final   Cryptococcus neoformans/gattii NOT DETECTED NOT DETECTED Final    Comment: Performed at [facility], [address] [place], [place] [address]  Culture, blood (Routine x 2)     Status: Abnormal   Collection Time: 11/26/23 12:23 PM   Specimen: BLOOD LEFT ARM  Result Value Ref Range Status   Specimen Description   Final    BLOOD LEFT ARM Performed at [facility], [address], [place], [place] [address]    Special Requests   Final    BOTTLES DRAWN AEROBIC AND ANAEROBIC Blood Culture results may not be optimal due to an inadequate volume of blood received in culture bottles Performed at [facility], [address] [place], [place] [address]  Culture  Setup Time   Final    GRAM POSITIVE COCCI AEROBIC BOTTLE ONLY CRITICAL RESULT CALLED TO, READ BACK BY AND VERIFIED WITH: BRIANA ALLEY ON 11/27/23 AT 1107 QSD    Culture (A)  Final    STAPHYLOCOCCUS EPIDERMIDIS THE SIGNIFICANCE OF ISOLATING THIS ORGANISM FROM A SINGLE SET OF BLOOD CULTURES WHEN MULTIPLE SETS ARE DRAWN IS UNCERTAIN. PLEASE NOTIFY THE MICROBIOLOGY DEPARTMENT WITHIN ONE WEEK IF SPECIATION AND SENSITIVITIES ARE REQUIRED. Performed at [facility], [address] [place], [place] [address]    Report Status 11/29/2023 FINAL  Final  Blood Culture ID Panel (Reflexed)     Status: Abnormal   Collection Time: 11/26/23 12:23 PM  Result Value Ref Range Status   Enterococcus faecalis NOT DETECTED NOT DETECTED Final   Enterococcus Faecium NOT DETECTED NOT DETECTED Final   Listeria monocytogenes NOT DETECTED NOT DETECTED Final   Staphylococcus species DETECTED (A) NOT DETECTED Final    Comment: CRITICAL RESULT CALLED TO, READ BACK BY AND VERIFIED WITH: BRIANA  ALLEY ON 11/27/23 AT 1107 QSD    Staphylococcus aureus (BCID) NOT DETECTED NOT DETECTED Final   Staphylococcus epidermidis DETECTED (A) NOT DETECTED Final    Comment: CRITICAL RESULT CALLED TO, READ BACK BY AND VERIFIED WITH: BRIANA ALLEY ON 11/27/23 AT 1107 QSD    Staphylococcus lugdunensis NOT DETECTED NOT DETECTED Final   Streptococcus species NOT DETECTED NOT DETECTED Final   Streptococcus agalactiae NOT DETECTED NOT DETECTED Final   Streptococcus pneumoniae NOT DETECTED NOT DETECTED Final   Streptococcus pyogenes NOT DETECTED NOT DETECTED Final   A.calcoaceticus-baumannii NOT DETECTED NOT DETECTED Final   Bacteroides fragilis NOT DETECTED NOT DETECTED Final   Enterobacterales NOT DETECTED NOT DETECTED Final   Enterobacter cloacae complex NOT DETECTED NOT DETECTED Final   Escherichia coli NOT DETECTED NOT DETECTED Final   Klebsiella aerogenes NOT DETECTED NOT DETECTED Final   Klebsiella oxytoca NOT DETECTED NOT DETECTED Final   Klebsiella pneumoniae NOT DETECTED NOT DETECTED Final   Proteus species NOT DETECTED NOT DETECTED Final   Salmonella species NOT DETECTED NOT DETECTED Final   Serratia marcescens NOT DETECTED NOT DETECTED Final   Haemophilus influenzae NOT DETECTED NOT DETECTED Final   Neisseria meningitidis NOT DETECTED NOT DETECTED Final   Pseudomonas aeruginosa NOT DETECTED NOT DETECTED Final   Stenotrophomonas maltophilia NOT DETECTED NOT DETECTED Final   Candida albicans NOT DETECTED NOT DETECTED Final   Candida auris NOT DETECTED NOT DETECTED Final   Candida glabrata NOT DETECTED NOT DETECTED Final   Candida krusei NOT DETECTED NOT DETECTED Final   Candida parapsilosis NOT DETECTED NOT DETECTED Final   Candida tropicalis NOT DETECTED NOT DETECTED Final   Cryptococcus neoformans/gattii NOT DETECTED NOT DETECTED Final   Methicillin resistance mecA/C NOT DETECTED NOT DETECTED Final    Comment: Performed at [facility], [address] [place], [place]  [address]  Body fluid culture w Gram Stain     Status: None   Collection Time: 11/26/23  8:55 PM   Specimen: PATH Cytology Peritoneal fluid  Result Value Ref Range Status   Specimen Description   Final    PERITONEAL CYTO Performed at [facility], [address], [place], [place] [address]    Special Requests   Final    NONE Performed at [facility], [address] [place], [place] [address]    Gram Stain   Final    FEW WBC PRESENT,BOTH PMN AND MONONUCLEAR FEW GRAM POSITIVE COCCI IN CHAINS  MODERATE GRAM NEGATIVE RODS FEW GRAM POSITIVE RODS Performed at [facility], [address] [place], [place] [address]    Culture   Final    MODERATE STREPTOCOCCUS MITIS/ORALIS RARE CITROBACTER AMALONATICUS    Report Status 11/30/2023 FINAL  Final   Organism ID, Bacteria STREPTOCOCCUS MITIS/ORALIS  Final   Organism ID, Bacteria CITROBACTER AMALONATICUS  Final      Susceptibility   Citrobacter amalonaticus - MIC*    CEFEPIME <=0.12 SENSITIVE Sensitive     CEFTAZIDIME <=1 SENSITIVE Sensitive     CEFTRIAXONE <=0.25 SENSITIVE Sensitive     CIPROFLOXACIN <=0.25 SENSITIVE Sensitive     GENTAMICIN <=1 SENSITIVE Sensitive     IMIPENEM <=0.25 SENSITIVE Sensitive     TRIMETH/SULFA <=20 SENSITIVE Sensitive     PIP/TAZO <=4 SENSITIVE Sensitive ug/mL    * RARE CITROBACTER AMALONATICUS   Streptococcus mitis/oralis - MIC*    PENICILLIN 0.25 INTERMEDIATE Intermediate     CEFTRIAXONE 0.25 SENSITIVE Sensitive     LEVOFLOXACIN 1 SENSITIVE Sensitive     VANCOMYCIN 0.5 SENSITIVE Sensitive     * MODERATE STREPTOCOCCUS MITIS/ORALIS  Culture, blood (Routine X 2) w Reflex to ID Panel     Status: None   Collection Time: 11/28/23  5:43 AM   Specimen: BLOOD LEFT ARM  Result Value Ref Range Status   Specimen Description BLOOD LEFT ARM  Final   Special Requests   Final    BOTTLES DRAWN AEROBIC AND ANAEROBIC Blood Culture results may not be optimal due to an inadequate volume of blood  received in culture bottles   Culture   Final    NO GROWTH 5 DAYS Performed at [facility], [address] [place], [place] [address]    Report Status 12/03/2023 FINAL  Final  Culture, blood (Routine X 2) w Reflex to ID Panel     Status: None   Collection Time: 11/28/23  5:47 AM   Specimen: BLOOD RIGHT HAND  Result Value Ref Range Status   Specimen Description BLOOD RIGHT HAND  Final   Special Requests   Final    BOTTLES DRAWN AEROBIC AND ANAEROBIC Blood Culture results may not be optimal due to an inadequate volume of blood received in culture bottles   Culture   Final    NO GROWTH 5 DAYS Performed at [facility], [address] [place], [place] [address]    Report Status 12/03/2023 FINAL  Final  Body fluid culture w Gram Stain     Status: None   Collection Time: 12/01/23 10:10 AM   Specimen: Ascitic; Body Fluid  Result Value Ref Range Status   Specimen Description   Final    ASCITIC Performed at [facility], [address] [place], [place] [address]    Special Requests   Final    peri Performed at [facility], [address] [place], [place] [address]    Gram Stain   Final    FEW WBC PRESENT, PREDOMINANTLY PMN ABUNDANT GRAM NEGATIVE RODS ABUNDANT GRAM POSITIVE COCCI RARE GRAM POSITIVE RODS    Culture   Final    FEW STREPTOCOCCUS MITIS/ORALIS RARE CITROBACTER AMALONATICUS MODERATE BACTEROIDES THETAIOTAOMICRON BETA LACTAMASE POSITIVE RARE EIKENELLA CORRODENS Usually susceptible to penicillin and other beta lactam agents,quinolones,macrolides and tetracyclines. Performed at [facility], [address], [place], [place] [address]    Report Status 12/06/2023 FINAL  Final   Organism ID, Bacteria STREPTOCOCCUS MITIS/ORALIS  Final   Organism ID, Bacteria CITROBACTER AMALONATICUS  Final      Susceptibility   Citrobacter amalonaticus - MIC*    CEFEPIME <=0.12 SENSITIVE Sensitive     CEFTAZIDIME <=1 SENSITIVE Sensitive      CEFTRIAXONE <=0.25 SENSITIVE Sensitive     CIPROFLOXACIN <=0.25 SENSITIVE Sensitive     GENTAMICIN <=1 SENSITIVE Sensitive     IMIPENEM <=0.25 SENSITIVE Sensitive     TRIMETH/SULFA <=20 SENSITIVE Sensitive     PIP/TAZO <=4 SENSITIVE Sensitive ug/mL    * RARE CITROBACTER AMALONATICUS   Streptococcus mitis/oralis - MIC*    PENICILLIN <=0.06 SENSITIVE Sensitive     CEFTRIAXONE 0.25 SENSITIVE Sensitive     LEVOFLOXACIN 1 SENSITIVE Sensitive     VANCOMYCIN 0.5 SENSITIVE Sensitive     * FEW STREPTOCOCCUS MITIS/ORALIS    Coagulation Studies: No results for input(s): "LABPROT", "INR" in the last 72 hours.   Urinalysis: No results for input(s): "COLORURINE", "LABSPEC", "PHURINE", "GLUCOSEU", "HGBUR", "BILIRUBINUR", "KETONESUR", "PROTEINUR", "UROBILINOGEN", "NITRITE", "LEUKOCYTESUR" in the last 72 hours.  Invalid input(s): "APPERANCEUR"    Imaging: No results found.      Medications:    anticoagulant sodium citrate       amLODipine  10 mg Oral Daily   apixaban  2.5 mg Oral BID   Chlorhexidine Gluconate Cloth  6 each Topical Q0600   ciprofloxacin  250 mg Oral QHS   epoetin alfa-epbx (RETACRIT) injection  10,000 Units Intravenous Q M,W,F-HD   feeding supplement  237 mL Oral TID BM   insulin aspart  0-6 Units Subcutaneous TID WC   irbesartan  300 mg Oral Daily   lactulose  20 g Oral BID   metoprolol tartrate  25 mg Oral BID   metroNIDAZOLE  500 mg Oral Q12H   multivitamin  1 tablet Oral QHS   pantoprazole  40 mg Oral Daily   tamsulosin  0.4 mg Oral Daily   acetaminophen, alteplase, alum & mag hydroxide-simeth, anticoagulant sodium citrate, antiseptic oral rinse, heparin, hydrALAZINE, hydrOXYzine, ondansetron (ZOFRAN) IV  Assessment/ Plan:  Mr. Casey Franco is a 63 y.o.  male with HTN, ESRD, DM-2, Hep C.  Patient presents with chest pain and has been admitted for Intractable vomiting [R11.10] Abdominal pain [R10.9] Pneumonia of both lungs due to infectious organism,  unspecified part of lung [J18.9] Sepsis without acute organ dysfunction, due to unspecified organism [facility]) [A41.9]  [facility]/MWF/right chest PermCath/71.0 kg  End-stage renal disease on hemodialysis.  Receiving dialysis today, UF 0. Next treatment scheduled for Wednesday. At the request of rehab, permcath exchange requested from vascular surgery.   2. Anemia of chronic kidney disease Lab Results  Component Value Date   HGB 8.3 (L) 12/14/2023    Hemoglobin 8.3.  Continue prescribed Epogen with dialysis treatments.  3. Secondary Hyperparathyroidism: with outpatient labs: PTH 351, phosphorus 6.6, calcium 8.1 on 11/23/2023.   Lab Results  Component Value Date   PTH 31 08/30/2018   CALCIUM 8.1 (L) 12/14/2023   PHOS 3.9 12/09/2023    Continue to monitor bone metabolism parameters.  4.  Hypertension with chronic kidney disease.  Home regimen includes furosemide, hydralazine, amlodipine, and irbesartan. Maintain the patient on amlodipine, Avapro, and metoprolol.  Blood pressure 130/82 during dialysis.  5.  Cirrhosis/ascites, spontaneous bacterial peritonitis. Paracentesis from 11/27/2023 yielded 1.6 L of fluid.  Complex loculations suspected as more ascites present. Microbiology results show Staph epidermidis in the blood. Peritoneal fluid culture from 11/26/2023 shows Streptococcus and Citrobacter. Lactulose twice a day. Paracentesis on 12/02/23 with 4.6L removed. Fluid cultures  negative  Patient remains on cipro and Flagyl.   LOS: 18 Nikitia Asbill 3/3/202512:21 PM

## 2023-12-15 ENCOUNTER — Telehealth (HOSPITAL_COMMUNITY): Payer: Self-pay | Admitting: Pharmacy Technician

## 2023-12-15 ENCOUNTER — Encounter
Admission: EM | Disposition: A | Discharge status: Home / Self Care | Payer: Self-pay | Source: Home / Self Care | Attending: Internal Medicine

## 2023-12-15 ENCOUNTER — Other Ambulatory Visit (HOSPITAL_COMMUNITY): Payer: Self-pay

## 2023-12-15 DIAGNOSIS — T8249XA Other complication of vascular dialysis catheter, initial encounter: Secondary | ICD-10-CM

## 2023-12-15 DIAGNOSIS — Z992 Dependence on renal dialysis: Secondary | ICD-10-CM | POA: Diagnosis not present

## 2023-12-15 DIAGNOSIS — K652 Spontaneous bacterial peritonitis: Secondary | ICD-10-CM | POA: Diagnosis not present

## 2023-12-15 DIAGNOSIS — N186 End stage renal disease: Secondary | ICD-10-CM | POA: Diagnosis not present

## 2023-12-15 HISTORY — PX: DIALYSIS/PERMA CATHETER REPAIR: CATH118293

## 2023-12-15 LAB — CBC WITH DIFFERENTIAL/PLATELET
Abs Immature Granulocytes: 0.04 10*3/uL (ref 0.00–0.07)
Basophils Absolute: 0.1 10*3/uL (ref 0.0–0.1)
Basophils Relative: 1 %
Eosinophils Absolute: 0.1 10*3/uL (ref 0.0–0.5)
Eosinophils Relative: 1 %
HCT: 24.5 % — ABNORMAL LOW (ref 39.0–52.0)
Hemoglobin: 8.2 g/dL — ABNORMAL LOW (ref 13.0–17.0)
Immature Granulocytes: 1 %
Lymphocytes Relative: 14 %
Lymphs Abs: 0.8 10*3/uL (ref 0.7–4.0)
MCH: 29.8 pg (ref 26.0–34.0)
MCHC: 33.5 g/dL (ref 30.0–36.0)
MCV: 89.1 fL (ref 80.0–100.0)
Monocytes Absolute: 1.1 10*3/uL — ABNORMAL HIGH (ref 0.1–1.0)
Monocytes Relative: 19 %
Neutro Abs: 3.8 10*3/uL (ref 1.7–7.7)
Neutrophils Relative %: 64 %
Platelets: 139 10*3/uL — ABNORMAL LOW (ref 150–400)
RBC: 2.75 MIL/uL — ABNORMAL LOW (ref 4.22–5.81)
RDW: 16.3 % — ABNORMAL HIGH (ref 11.5–15.5)
WBC: 5.9 10*3/uL (ref 4.0–10.5)
nRBC: 0 % (ref 0.0–0.2)

## 2023-12-15 LAB — BASIC METABOLIC PANEL
Anion gap: 11 (ref 5–15)
BUN: 18 mg/dL (ref 8–23)
CO2: 24 mmol/L (ref 22–32)
Calcium: 7.9 mg/dL — ABNORMAL LOW (ref 8.9–10.3)
Chloride: 98 mmol/L (ref 98–111)
Creatinine, Ser: 4.48 mg/dL — ABNORMAL HIGH (ref 0.61–1.24)
GFR, Estimated: 14 mL/min — ABNORMAL LOW (ref >60)
Glucose, Bld: 90 mg/dL (ref 70–99)
Potassium: 3.5 mmol/L (ref 3.5–5.1)
Sodium: 133 mmol/L — ABNORMAL LOW (ref 135–145)

## 2023-12-15 LAB — GLUCOSE, CAPILLARY
Glucose-Capillary: 85 mg/dL (ref 70–99)
Glucose-Capillary: 83 mg/dL (ref 70–99)
Glucose-Capillary: 89 mg/dL (ref 70–99)
Glucose-Capillary: 122 mg/dL — ABNORMAL HIGH (ref 70–99)

## 2023-12-15 SURGERY — DIALYSIS/PERMA CATHETER REPAIR
Anesthesia: Moderate Sedation

## 2023-12-15 MED ORDER — CEFAZOLIN SODIUM-DEXTROSE 1-4 GM/50ML-% IV SOLN: 1.0000 g | INTRAVENOUS | Status: DC

## 2023-12-15 MED ORDER — DIPHENHYDRAMINE HCL 50 MG/ML IJ SOLN: 50.0000 mg | Freq: Once | INTRAMUSCULAR | Status: DC | PRN

## 2023-12-15 MED ORDER — HEPARIN (PORCINE) IN NACL 1000-0.9 UT/500ML-% IV SOLN
INTRAVENOUS | Status: DC | PRN
  Administered 2023-12-15: 500 mL

## 2023-12-15 MED ORDER — DIPHENHYDRAMINE HCL 50 MG/ML IJ SOLN
INTRAMUSCULAR | Status: DC | PRN
  Administered 2023-12-15: 25 mg via INTRAVENOUS

## 2023-12-15 MED ORDER — FENTANYL CITRATE (PF) 100 MCG/2ML IJ SOLN
INTRAMUSCULAR | Status: DC | PRN
  Administered 2023-12-15: 25 ug via INTRAVENOUS
  Administered 2023-12-15: 50 ug via INTRAVENOUS

## 2023-12-15 MED ORDER — CEFAZOLIN SODIUM-DEXTROSE 1-4 GM/50ML-% IV SOLN
INTRAVENOUS | Status: AC
Start: 2023-12-15 — End: ?
  Filled 2023-12-15: qty 50

## 2023-12-15 MED ORDER — FENTANYL CITRATE (PF) 100 MCG/2ML IJ SOLN
INTRAMUSCULAR | Status: AC
  Filled 2023-12-15: qty 2

## 2023-12-15 MED ORDER — LIDOCAINE-EPINEPHRINE (PF) 1 %-1:200000 IJ SOLN
INTRAMUSCULAR | Status: DC | PRN
  Administered 2023-12-15: 20 mL

## 2023-12-15 MED ORDER — MIDAZOLAM HCL 2 MG/ML PO SYRP
8.0000 mg | ORAL_SOLUTION | Freq: Once | ORAL | Status: DC | PRN
Start: 2023-12-15 — End: 2023-12-15

## 2023-12-15 MED ORDER — MIDAZOLAM HCL 5 MG/5ML IJ SOLN
INTRAMUSCULAR | Status: AC
  Filled 2023-12-15: qty 5

## 2023-12-15 MED ORDER — DIPHENHYDRAMINE HCL 50 MG/ML IJ SOLN
INTRAMUSCULAR | Status: AC
  Filled 2023-12-15: qty 1

## 2023-12-15 MED ORDER — MIDAZOLAM HCL 2 MG/2ML IJ SOLN
INTRAMUSCULAR | Status: DC | PRN
  Administered 2023-12-15: .5 mg via INTRAVENOUS
  Administered 2023-12-15: 2 mg via INTRAVENOUS

## 2023-12-15 MED ORDER — FAMOTIDINE 20 MG PO TABS: 40.0000 mg | ORAL_TABLET | Freq: Once | ORAL | Status: DC | PRN

## 2023-12-15 MED ORDER — SODIUM CHLORIDE 0.9 % IV SOLN: INTRAVENOUS | Status: DC

## 2023-12-15 MED ORDER — FENTANYL CITRATE PF 50 MCG/ML IJ SOSY: 12.5000 ug | PREFILLED_SYRINGE | Freq: Once | INTRAMUSCULAR | Status: DC | PRN

## 2023-12-15 MED ORDER — HEPARIN SODIUM (PORCINE) 10000 UNIT/ML IJ SOLN
INTRAMUSCULAR | Status: DC | PRN
  Administered 2023-12-15: 10000 [IU]

## 2023-12-15 MED ORDER — METHYLPREDNISOLONE SODIUM SUCC 125 MG IJ SOLR: 125.0000 mg | Freq: Once | INTRAMUSCULAR | Status: DC | PRN

## 2023-12-15 SURGICAL SUPPLY — 7 items
BIOPATCH RED 1 DISK 7.0 (GAUZE/BANDAGES/DRESSINGS) IMPLANT
CATH PALINDROME-P 19CM W/VT (CATHETERS) IMPLANT
COVER PROBE ULTRASOUND 5X96 (MISCELLANEOUS) IMPLANT
GUIDEWIRE SUPER STIFF .035X180 (WIRE) IMPLANT
PACK ANGIOGRAPHY (CUSTOM PROCEDURE TRAY) IMPLANT
SUT MNCRL AB 4-0 PS2 18 (SUTURE) IMPLANT
SUT SILK 0 FSL (SUTURE) IMPLANT

## 2023-12-15 NOTE — Progress Notes (Signed)
 Writer offered to assist with CHG bath, education provided on what CHG is and purpose. Continue to decline.

## 2023-12-15 NOTE — Op Note (Signed)
 [facility]   OPERATIVE NOTE     PROCEDURE: 1. Exchange right IJ tunneled dialysis catheter over wire same access   PRE-OPERATIVE DIAGNOSIS: Complication of dialysis device with nonfunction of tunneled catheter; end-stage renal requiring hemodialysis  POST-OPERATIVE DIAGNOSIS: same as above  SURGEON: Renford Dills, M.D.  ANESTHESIA: Conscious sedation was administered under my direct supervision by the interventional radiology RN.  IV Versed plus fentanyl were utilized. Continuous ECG, pulse oximetry and blood pressure was monitored throughout the entire procedure.  Conscious sedation was for a total of 26 minutes and 33 seconds.  ESTIMATED BLOOD LOSS: Minimal  FINDING(S): 1.  Tips of the catheter in the right atrium on fluoroscopy 2.  No obvious pneumothorax on fluoroscopy  SPECIMEN(S):  none  INDICATIONS:   Casey Franco is a 63 y.o. male  presents with end stage renal disease.  Therefore, the patient requires a tunneled dialysis catheter placement.  The patient is informed of  the risks catheter placement include but are not limited to: bleeding, infection, central venous injury, pneumothorax, possible venous stenosis, possible malpositioning in the venous system, and possible infections related to long-term catheter presence.  The patient was aware of these risks and agreed to proceed.  DESCRIPTION: The patient was taken back to Special Procedure suite.  Prior to sedation, the patient was given IV antibiotics.  After obtaining adequate sedation, the patient was prepped and draped in the standard fashion for a chest or neck tunneled dialysis catheter placement.  Appropriate Time Out is called.   The right neck and chest wall are then infiltrated with 1% Lidocaine with epinepherine.  A 19 cm tip to cuff catheter is then selected, opened on the back table and prepped.  The cuff of the existing catheter is localized.  Using both blunt and sharp  dissection the cuff is then freed from surrounding attachments. The catheter is then controlled with a hemostat and transected above the level of the cuff.  Under fluoroscopy an Amplatz Super Stiff wire is introduced through the catheter and negotiated into the inferior vena cava. The remaining portion of the catheter is then removed without difficulty.  A new catheter is then threaded over the wire and advanced under fluoroscopy and positioned so that the catheter tip is in the proximal atrium.  Each port was tested by aspirating and flushing.  No resistance was noted.  Each port was then thoroughly flushed with heparinized saline.  The catheter was secured in placed with two interrupted stitches of 0 silk tied to the catheter.   Each port was then packed with concentrated heparin (10,000 Units/mL) at the manufacturer recommended volumes to each port.  Sterile caps were applied to each port.  On completion fluoroscopy, the tips of the catheter were in the right atrium, and there was no evidence of pneumothorax.  COMPLICATIONS: None  CONDITION: Casey Franco 12/15/2023,3:01 PM [facility] Office: [phone]   12/15/2023, 3:01 PM

## 2023-12-15 NOTE — Interval H&P Note (Signed)
 History and Physical Interval Note:  12/15/2023 3:00 PM  Casey Franco  has presented today for surgery, with the diagnosis of ESRD.  The various methods of treatment have been discussed with the patient and family. After consideration of risks, benefits and other options for treatment, the patient has consented to  Procedure(s): DIALYSIS/PERMA CATHETER REPAIR (N/A) as a surgical intervention.  The patient's history has been reviewed, patient examined, no change in status, stable for surgery.  I have reviewed the patient's chart and labs.  Questions were answered to the patient's satisfaction.     Levora Dredge

## 2023-12-15 NOTE — Progress Notes (Signed)
 Pt s/p HD cath replacement noted large amount of blood to gown. Dressing to cath saturated. Compression applied x 5 minutes x2 with encouragement and compression dressing left in place with gauze and compression tape to chest and right flank. Pt encouraged to limit use of limb to control bleeding however non-receptive require emotional support reinforcement. CCMD notified pt fluctuating from afluttter and afib since return from procedure. MD notified no new orders.

## 2023-12-15 NOTE — Evaluation (Deleted)
 Occupational Therapy Evaluation Patient Details Name: Casey Franco MRN: [id number] DOB: 11-Aug-1961 Today's Date: 12/15/2023   History of Present Illness   presented to ER secondary to abdominal pain, chest pain and coffee-ground emesis; admitted for management of spontaneous bacterial peritonitis.  s/p paracentesis with 1.6L removed on 2/13     Clinical Impressions Pt is supine in bed on arrival. Initially complaining of having the catheter replacement today and not wanting to do much, but with encouragement is agreeable to OT session. He denies pain. Pt performed bed mobility with SUP and was able to tolerate sitting EOB for ~20 mins to perform grooming tasks and shave his face. PT joined session to progress his mobility, he needed CGA and verb cues to push up from EOB to RW for STS, then handoff to PT. Pt returned to bed with all needs in place and will cont to require skilled acute OT services to maximize his safety and IND to return to PLOF.      If plan is discharge home, recommend the following:   A little help with walking and/or transfers;Assistance with cooking/housework;Direct supervision/assist for financial management;Supervision due to cognitive status;Direct supervision/assist for medications management;Help with stairs or ramp for entrance;A little help with bathing/dressing/bathroom     Functional Status Assessment         Equipment Recommendations   Other (comment) (defer)     Recommendations for Other Services         Precautions/Restrictions   Precautions Precautions: Fall Recall of Precautions/Restrictions: Impaired Precaution/Restrictions Comments: Very impulsive with mobility. Judgement/safety awareness seems to flucctuate. Restrictions Weight Bearing Restrictions Per Provider Order: No     Mobility Bed Mobility Overal bed mobility: Needs Assistance Bed Mobility: Supine to Sit     Supine to sit: HOB elevated, Used rails, Supervision      General bed mobility comments: SUP for bed mobility to reach EOB including scooting    Transfers Overall transfer level: Needs assistance Equipment used: Rolling walker (2 wheels) Transfers: Sit to/from Stand Sit to Stand: Supervision, Contact guard assist           General transfer comment: vcs to push from bed to stand with CGA/SBA to RW      Balance Overall balance assessment: Needs assistance Sitting-balance support: Feet supported, No upper extremity supported Sitting balance-Leahy Scale: Good Sitting balance - Comments: no LOB while seated EOB 20 mins to perform grooming tasks                                   ADL either performed or assessed with clinical judgement   ADL Overall ADL's : Needs assistance/impaired     Grooming: Sitting;Wash/dry face;Set up Grooming Details (indicate cue type and reason): face washing and shaving his face with set up seated at EOB         Upper Body Dressing : Minimal assistance;Sitting Upper Body Dressing Details (indicate cue type and reason): MIN A to reach tie at back Lower Body Dressing: Maximal assistance;Sitting/lateral leans                       Vision         Perception         Praxis         Pertinent Vitals/Pain Pain Assessment Pain Assessment: No/denies pain Pain Intervention(s): Monitored during session     Extremity/Trunk Assessment  Communication Communication Communication: No apparent difficulties   Cognition Arousal: Alert Behavior During Therapy: Agitated, Impulsive, Restless Cognition: Cognition impaired, Difficult to assess             OT - Cognition Comments: Variable behavior/cognition during sesison. Easily agitated by verbal cueing, but by end of session is pleasant, thankful for opportunity to get cleaned up and engage in functional activity with author.                 Following commands: Intact       Cueing  General  Comments   Cueing Techniques: Verbal cues      Exercises     Shoulder Instructions      Home Living                                          Prior Functioning/Environment                      OT Problem List:     OT Treatment/Interventions:        OT Goals(Current goals can be found in the care plan section)   Acute Rehab OT Goals Patient Stated Goal: go to rehab OT Goal Formulation: With patient Time For Goal Achievement: 12/29/23 Potential to Achieve Goals: Fair   OT Frequency:  Min 1X/week    Co-evaluation              AM-PAC OT "6 Clicks" Daily Activity     Outcome Measure Help from another person eating meals?: None Help from another person taking care of personal grooming?: None Help from another person toileting, which includes using toliet, bedpan, or urinal?: A Lot Help from another person bathing (including washing, rinsing, drying)?: A Lot Help from another person to put on and taking off regular upper body clothing?: A Little Help from another person to put on and taking off regular lower body clothing?: A Lot 6 Click Score: 17   End of Session Equipment Utilized During Treatment: Rolling walker (2 wheels) Nurse Communication: Mobility status  Activity Tolerance: Patient tolerated treatment well Patient left:  (handoff to PT)  OT Visit Diagnosis: Other abnormalities of gait and mobility (R26.89);Unsteadiness on feet (R26.81);Muscle weakness (generalized) (M62.81)                Time: 0981-1914 OT Time Calculation (min): 25 min Charges:  OT General Charges $OT Visit: 1 Visit OT Treatments $Self Care/Home Management : 23-37 mins Jon Lall, OTR/L 12/15/23, 11:26 AM  Alana Dayton E Loneta Tamplin 12/15/2023, 11:20 AM

## 2023-12-15 NOTE — Progress Notes (Signed)
 Physical Therapy Re-Evaluation Patient Details Name: Casey Franco MRN: [id number] DOB: 12-03-1960 Today's Date: 12/15/2023  History of Present Illness  presented to ER secondary to abdominal pain, chest pain and coffee-ground emesis; admitted for management of spontaneous bacterial peritonitis.  s/p paracentesis with 1.6L removed on 2/13  Clinical Impression  Patient seated EOB on arrival with OT present. Patient agreeable to mobility on his own timing. Stood from bedside with supervision and ambulated 360' in hallway with RW and supervision for majority of ambulation but required CGA for sidesteps and crossing feet. 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 Discharge plan remains appropriate.          If plan is discharge home, recommend the following: A little help with walking and/or transfers;A little help with bathing/dressing/bathroom;Assistance with cooking/housework;Direct supervision/assist for medications management;Direct supervision/assist for financial management;Assist for transportation;Help with stairs or ramp for entrance   Can travel by private vehicle   Yes    Equipment Recommendations Rolling Masato Pettie (2 wheels)  Recommendations for Other Services       Functional Status Assessment       Precautions / Restrictions Precautions Precautions: Fall Recall of Precautions/Restrictions: Impaired Precaution/Restrictions Comments: impulsive Restrictions Weight Bearing Restrictions Per Provider Order: No      Mobility  Bed Mobility               General bed mobility comments: seated EOB on arrival with OT present    Transfers Overall transfer level: Needs assistance Equipment used: Rolling Kanna Dafoe (2 wheels) Transfers: Sit to/from Stand Sit to Stand: Supervision           General transfer comment: cues to push from  bed. Supervision for safety    Ambulation/Gait Ambulation/Gait assistance: Supervision, Contact guard assist Gait Distance (Feet): 360 Feet Assistive device: Rolling Moses Odoherty (2 wheels) Gait Pattern/deviations: Step-through pattern Gait velocity: decreased     General Gait Details: supervision for most of ambulation. Required CGA during sidestepping and crossing feet  Stairs            Wheelchair Mobility     Tilt Bed    Modified Rankin (Stroke Patients Only)       Balance Overall balance assessment: Needs assistance Sitting-balance support: Feet supported, No upper extremity supported Sitting balance-Leahy Scale: Good     Standing balance support: Bilateral upper extremity supported, During functional activity, Reliant on assistive device for balance Standing balance-Leahy Scale: Fair                               Pertinent Vitals/Pain Pain Assessment Pain Assessment: No/denies pain    Home Living                          Prior Function                       Extremity/Trunk Assessment                Communication   Communication Communication: No apparent difficulties    Cognition Arousal: Alert Behavior During Therapy: Impulsive   PT - Cognitive impairments: No family/caregiver present to determine baseline                       PT - Cognition Comments: impulsive throughout  with decreased safety awareness. Seems to be baseline. Following commands: Intact       Cueing       General Comments      Exercises     Assessment/Plan    PT Assessment    PT Problem List         PT Treatment Interventions      PT Goals (Current goals can be found in the Care Plan section)  Acute Rehab PT Goals Patient Stated Goal: rehab then home PT Goal Formulation: With patient Time For Goal Achievement: 12/29/23 Potential to Achieve Goals: Fair    Frequency Min 2X/week     Co-evaluation                AM-PAC PT "6 Clicks" Mobility  Outcome Measure Help needed turning from your back to your side while in a flat bed without using bedrails?: None Help needed moving from lying on your back to sitting on the side of a flat bed without using bedrails?: None Help needed moving to and from a bed to a chair (including a wheelchair)?: A Little Help needed standing up from a chair using your arms (e.g., wheelchair or bedside chair)?: A Little Help needed to walk in hospital room?: A Little Help needed climbing 3-5 steps with a railing? : A Lot 6 Click Score: 19    End of Session   Activity Tolerance: Patient tolerated treatment well;Patient limited by fatigue Patient left: in bed;with call bell/phone within reach;with bed alarm set Nurse Communication: Mobility status PT Visit Diagnosis: Difficulty in walking, not elsewhere classified (R26.2);Muscle weakness (generalized) (M62.81)    Time: 1914-7829 PT Time Calculation (min) (ACUTE ONLY): 25 min   Charges:   PT Evaluation $PT Re-evaluation: 1 Re-eval PT Treatments $Therapeutic Activity: 8-22 mins PT General Charges $$ ACUTE PT VISIT: 1 Visit         Maylon Peppers, PT, DPT Physical Therapist - [facility]  [facility]   Munir Victorian A Lakrisha Iseman 12/15/2023, 10:03 AM

## 2023-12-15 NOTE — Progress Notes (Signed)
 Pt refused CHG bath with education and encouragement. Will reattempt

## 2023-12-15 NOTE — Progress Notes (Signed)
 [facility]  ROUNDING NOTE   Subjective:   Casey Franco is a 63 y.o. male with past medical history of HTN, ESRD, DM-2, and Hep C. Patient presents to emergency department with abdominal pain. He has been admitted for Intractable vomiting [R11.10] Abdominal pain [R10.9] Pneumonia of both lungs due to infectious organism, unspecified part of lung [J18.9] Sepsis without acute organ dysfunction, due to unspecified organism [facility]) [A41.9]  Patient is known to our practice and receives outpatient dialysis at [facility] on a MWF, last treatment on Tuesday.    Update Patient seen sitting up in bed NPO for permcath exchange    Objective:  Vital signs in last 24 hours:  Temp:  [98 F (36.7 C)-98.9 F (37.2 C)] 98 F (36.7 C) (03/04 1213) Pulse Rate:  [78-81] 81 (03/04 1213) Resp:  [15-20] 15 (03/04 1213) BP: (111-118)/(77-80) 118/80 (03/04 1213) SpO2:  [97 %-100 %] 100 % (03/04 1213)  Weight change:  Filed Weights   12/11/23 1133 12/14/23 1001 12/14/23 1320  Weight: 57.3 kg 60.6 kg 60.6 kg    Intake/Output: No intake/output data recorded.   Intake/Output this shift:  No intake/output data recorded.  Physical Exam: General: NAD  Head: Normocephalic, atraumatic. Moist oral mucosal membranes  Eyes: Anicteric  Lungs:  Clear to auscultation, normal effort  Heart: Regular rate and rhythm, aflutter  Abdomen:  Soft, tender, mild distension  Extremities: No peripheral edema.  Neurologic: Alert and oriented, moving all four extremities  Skin: No lesions  Access: Right chest tunneled catheter    Basic Metabolic Panel: Recent Labs  Lab 12/09/23 0428 12/10/23 0443 12/11/23 0415 12/12/23 0544 12/13/23 0701 12/14/23 0418 12/15/23 0354  NA 136   < > 135 137 135 135 133*  K 3.5   < > 3.5 3.7 3.6 3.7 3.5  CL 102   < > 102 104 101 100 98  CO2 22   < > 22 24 24 22 24   GLUCOSE 99   < > 103* 88 82 94 90  BUN 28*   < > 22 14 21  27* 18  CREATININE 6.26*    < > 5.63* 4.07* 5.79* 6.72* 4.48*  CALCIUM 7.8*   < > 7.9* 8.1* 8.1* 8.1* 7.9*  PHOS 3.9  --   --   --   --   --   --    < > = values in this interval not displayed.    Liver Function Tests: Recent Labs  Lab 12/09/23 0428  ALBUMIN 1.9*   No results for input(s): "LIPASE", "AMYLASE" in the last 168 hours.  No results for input(s): "AMMONIA" in the last 168 hours.   CBC: Recent Labs  Lab 12/11/23 0415 12/12/23 0544 12/13/23 0701 12/14/23 0418 12/15/23 0354  WBC 3.8* 4.5 4.8 4.9 5.9  NEUTROABS 2.2 2.7 2.8 3.1 3.8  HGB 7.6* 8.0* 8.4* 8.3* 8.2*  HCT 23.0* 24.6* 26.4* 25.0* 24.5*  MCV 90.2 92.5 90.4 89.9 89.1  PLT 123* 122* 129* 130* 139*    Cardiac Enzymes: No results for input(s): "CKTOTAL", "CKMB", "CKMBINDEX", "TROPONINI" in the last 168 hours.  BNP: Invalid input(s): "POCBNP"  CBG: Recent Labs  Lab 12/14/23 0800 12/14/23 1630 12/14/23 2122 12/15/23 0806 12/15/23 1143  GLUCAP 87 106* 133* 85 83    Microbiology: Results for orders placed or performed during the hospital encounter of 11/26/23  Culture, blood (Routine x 2)     Status: Abnormal   Collection Time: 11/26/23 11:22 AM   Specimen:  BLOOD  Result Value Ref Range Status   Specimen Description   Final    BLOOD RIGHT ANTECUBITAL Performed at [facility], [address] [place], [place] [address]    Special Requests   Final    BOTTLES DRAWN AEROBIC AND ANAEROBIC Blood Culture results may not be optimal due to an inadequate volume of blood received in culture bottles Performed at [facility], [address] [place], [place] [address]    Culture  Setup Time   Final    ANAEROBIC BOTTLE ONLY GRAM NEGATIVE RODS CRITICAL RESULT CALLED TO, READ BACK BY AND VERIFIED WITH: CAROLINE COULTER @ 11/28/23 2033 AB    Culture (A)  Final    BACTEROIDES THETAIOTAOMICRON BETA LACTAMASE POSITIVE Performed at [facility], [address], [place], [place] [address]    Report Status  12/01/2023 FINAL  Final  Blood Culture ID Panel (Reflexed)     Status: None   Collection Time: 11/26/23 11:22 AM  Result Value Ref Range Status   Enterococcus faecalis NOT DETECTED NOT DETECTED Final   Enterococcus Faecium NOT DETECTED NOT DETECTED Final   Listeria monocytogenes NOT DETECTED NOT DETECTED Final   Staphylococcus species NOT DETECTED NOT DETECTED Final   Staphylococcus aureus (BCID) NOT DETECTED NOT DETECTED Final   Staphylococcus epidermidis NOT DETECTED NOT DETECTED Final   Staphylococcus lugdunensis NOT DETECTED NOT DETECTED Final   Streptococcus species NOT DETECTED NOT DETECTED Final   Streptococcus agalactiae NOT DETECTED NOT DETECTED Final   Streptococcus pneumoniae NOT DETECTED NOT DETECTED Final   Streptococcus pyogenes NOT DETECTED NOT DETECTED Final   A.calcoaceticus-baumannii NOT DETECTED NOT DETECTED Final   Bacteroides fragilis NOT DETECTED NOT DETECTED Final   Enterobacterales NOT DETECTED NOT DETECTED Final   Enterobacter cloacae complex NOT DETECTED NOT DETECTED Final   Escherichia coli NOT DETECTED NOT DETECTED Final   Klebsiella aerogenes NOT DETECTED NOT DETECTED Final   Klebsiella oxytoca NOT DETECTED NOT DETECTED Final   Klebsiella pneumoniae NOT DETECTED NOT DETECTED Final   Proteus species NOT DETECTED NOT DETECTED Final   Salmonella species NOT DETECTED NOT DETECTED Final   Serratia marcescens NOT DETECTED NOT DETECTED Final   Haemophilus influenzae NOT DETECTED NOT DETECTED Final   Neisseria meningitidis NOT DETECTED NOT DETECTED Final   Pseudomonas aeruginosa NOT DETECTED NOT DETECTED Final   Stenotrophomonas maltophilia NOT DETECTED NOT DETECTED Final   Candida albicans NOT DETECTED NOT DETECTED Final   Candida auris NOT DETECTED NOT DETECTED Final   Candida glabrata NOT DETECTED NOT DETECTED Final   Candida krusei NOT DETECTED NOT DETECTED Final   Candida parapsilosis NOT DETECTED NOT DETECTED Final   Candida tropicalis NOT DETECTED NOT  DETECTED Final   Cryptococcus neoformans/gattii NOT DETECTED NOT DETECTED Final    Comment: Performed at [facility], [address] [place], [place] [address]  Culture, blood (Routine x 2)     Status: Abnormal   Collection Time: 11/26/23 12:23 PM   Specimen: BLOOD LEFT ARM  Result Value Ref Range Status   Specimen Description   Final    BLOOD LEFT ARM Performed at [facility], [address], [place], [place] [address]    Special Requests   Final    BOTTLES DRAWN AEROBIC AND ANAEROBIC Blood Culture results may not be optimal due to an inadequate volume of blood received in culture bottles Performed at [facility], [address], [place], [place] [address]    Culture  Setup Time  Final    GRAM POSITIVE COCCI AEROBIC BOTTLE ONLY CRITICAL RESULT CALLED TO, READ BACK BY AND VERIFIED WITH: BRIANA ALLEY ON 11/27/23 AT 1107 QSD    Culture (A)  Final    STAPHYLOCOCCUS EPIDERMIDIS THE SIGNIFICANCE OF ISOLATING THIS ORGANISM FROM A SINGLE SET OF BLOOD CULTURES WHEN MULTIPLE SETS ARE DRAWN IS UNCERTAIN. PLEASE NOTIFY THE MICROBIOLOGY DEPARTMENT WITHIN ONE WEEK IF SPECIATION AND SENSITIVITIES ARE REQUIRED. Performed at [facility], [address] [place], [place] [address]    Report Status 11/29/2023 FINAL  Final  Blood Culture ID Panel (Reflexed)     Status: Abnormal   Collection Time: 11/26/23 12:23 PM  Result Value Ref Range Status   Enterococcus faecalis NOT DETECTED NOT DETECTED Final   Enterococcus Faecium NOT DETECTED NOT DETECTED Final   Listeria monocytogenes NOT DETECTED NOT DETECTED Final   Staphylococcus species DETECTED (A) NOT DETECTED Final    Comment: CRITICAL RESULT CALLED TO, READ BACK BY AND VERIFIED WITH: BRIANA ALLEY ON 11/27/23 AT 1107 QSD    Staphylococcus aureus (BCID) NOT DETECTED NOT DETECTED Final   Staphylococcus epidermidis DETECTED (A) NOT DETECTED Final    Comment: CRITICAL RESULT CALLED TO, READ BACK BY AND VERIFIED  WITH: BRIANA ALLEY ON 11/27/23 AT 1107 QSD    Staphylococcus lugdunensis NOT DETECTED NOT DETECTED Final   Streptococcus species NOT DETECTED NOT DETECTED Final   Streptococcus agalactiae NOT DETECTED NOT DETECTED Final   Streptococcus pneumoniae NOT DETECTED NOT DETECTED Final   Streptococcus pyogenes NOT DETECTED NOT DETECTED Final   A.calcoaceticus-baumannii NOT DETECTED NOT DETECTED Final   Bacteroides fragilis NOT DETECTED NOT DETECTED Final   Enterobacterales NOT DETECTED NOT DETECTED Final   Enterobacter cloacae complex NOT DETECTED NOT DETECTED Final   Escherichia coli NOT DETECTED NOT DETECTED Final   Klebsiella aerogenes NOT DETECTED NOT DETECTED Final   Klebsiella oxytoca NOT DETECTED NOT DETECTED Final   Klebsiella pneumoniae NOT DETECTED NOT DETECTED Final   Proteus species NOT DETECTED NOT DETECTED Final   Salmonella species NOT DETECTED NOT DETECTED Final   Serratia marcescens NOT DETECTED NOT DETECTED Final   Haemophilus influenzae NOT DETECTED NOT DETECTED Final   Neisseria meningitidis NOT DETECTED NOT DETECTED Final   Pseudomonas aeruginosa NOT DETECTED NOT DETECTED Final   Stenotrophomonas maltophilia NOT DETECTED NOT DETECTED Final   Candida albicans NOT DETECTED NOT DETECTED Final   Candida auris NOT DETECTED NOT DETECTED Final   Candida glabrata NOT DETECTED NOT DETECTED Final   Candida krusei NOT DETECTED NOT DETECTED Final   Candida parapsilosis NOT DETECTED NOT DETECTED Final   Candida tropicalis NOT DETECTED NOT DETECTED Final   Cryptococcus neoformans/gattii NOT DETECTED NOT DETECTED Final   Methicillin resistance mecA/C NOT DETECTED NOT DETECTED Final    Comment: Performed at [facility], [address] [place], [place] [address]  Body fluid culture w Gram Stain     Status: None   Collection Time: 11/26/23  8:55 PM   Specimen: PATH Cytology Peritoneal fluid  Result Value Ref Range Status   Specimen Description   Final    PERITONEAL  CYTO Performed at [facility], [address] [place], [place] [address]    Special Requests   Final    NONE Performed at [facility], [address] [place], [place] [address]    Gram Stain   Final    FEW WBC PRESENT,BOTH PMN AND MONONUCLEAR FEW GRAM POSITIVE COCCI IN CHAINS MODERATE GRAM NEGATIVE RODS FEW Romie Minus  POSITIVE RODS Performed at [facility], [address], [place], [place] [address]    Culture   Final    MODERATE STREPTOCOCCUS MITIS/ORALIS RARE CITROBACTER AMALONATICUS    Report Status 11/30/2023 FINAL  Final   Organism ID, Bacteria STREPTOCOCCUS MITIS/ORALIS  Final   Organism ID, Bacteria CITROBACTER AMALONATICUS  Final      Susceptibility   Citrobacter amalonaticus - MIC*    CEFEPIME <=0.12 SENSITIVE Sensitive     CEFTAZIDIME <=1 SENSITIVE Sensitive     CEFTRIAXONE <=0.25 SENSITIVE Sensitive     CIPROFLOXACIN <=0.25 SENSITIVE Sensitive     GENTAMICIN <=1 SENSITIVE Sensitive     IMIPENEM <=0.25 SENSITIVE Sensitive     TRIMETH/SULFA <=20 SENSITIVE Sensitive     PIP/TAZO <=4 SENSITIVE Sensitive ug/mL    * RARE CITROBACTER AMALONATICUS   Streptococcus mitis/oralis - MIC*    PENICILLIN 0.25 INTERMEDIATE Intermediate     CEFTRIAXONE 0.25 SENSITIVE Sensitive     LEVOFLOXACIN 1 SENSITIVE Sensitive     VANCOMYCIN 0.5 SENSITIVE Sensitive     * MODERATE STREPTOCOCCUS MITIS/ORALIS  Culture, blood (Routine X 2) w Reflex to ID Panel     Status: None   Collection Time: 11/28/23  5:43 AM   Specimen: BLOOD LEFT ARM  Result Value Ref Range Status   Specimen Description BLOOD LEFT ARM  Final   Special Requests   Final    BOTTLES DRAWN AEROBIC AND ANAEROBIC Blood Culture results may not be optimal due to an inadequate volume of blood received in culture bottles   Culture   Final    NO GROWTH 5 DAYS Performed at [facility], [address] [place], [place] [address]    Report Status 12/03/2023 FINAL  Final  Culture, blood (Routine X  2) w Reflex to ID Panel     Status: None   Collection Time: 11/28/23  5:47 AM   Specimen: BLOOD RIGHT HAND  Result Value Ref Range Status   Specimen Description BLOOD RIGHT HAND  Final   Special Requests   Final    BOTTLES DRAWN AEROBIC AND ANAEROBIC Blood Culture results may not be optimal due to an inadequate volume of blood received in culture bottles   Culture   Final    NO GROWTH 5 DAYS Performed at [facility], [address] [place], [place] [address]    Report Status 12/03/2023 FINAL  Final  Body fluid culture w Gram Stain     Status: None   Collection Time: 12/01/23 10:10 AM   Specimen: Ascitic; Body Fluid  Result Value Ref Range Status   Specimen Description   Final    ASCITIC Performed at [facility], [address] [place], [place] [address]    Special Requests   Final    peri Performed at [facility], [address] [place], [place] [address]    Gram Stain   Final    FEW WBC PRESENT, PREDOMINANTLY PMN ABUNDANT GRAM NEGATIVE RODS ABUNDANT GRAM POSITIVE COCCI RARE GRAM POSITIVE RODS    Culture   Final    FEW STREPTOCOCCUS MITIS/ORALIS RARE CITROBACTER AMALONATICUS MODERATE BACTEROIDES THETAIOTAOMICRON BETA LACTAMASE POSITIVE RARE EIKENELLA CORRODENS Usually susceptible to penicillin and other beta lactam agents,quinolones,macrolides and tetracyclines. Performed at [facility], [address] [place], [place] [address]    Report Status 12/06/2023 FINAL  Final   Organism ID, Bacteria STREPTOCOCCUS MITIS/ORALIS  Final   Organism ID, Bacteria CITROBACTER AMALONATICUS  Final  Susceptibility   Citrobacter amalonaticus - MIC*    CEFEPIME <=0.12 SENSITIVE Sensitive     CEFTAZIDIME <=1 SENSITIVE Sensitive     CEFTRIAXONE <=0.25 SENSITIVE Sensitive     CIPROFLOXACIN <=0.25 SENSITIVE Sensitive     GENTAMICIN <=1 SENSITIVE Sensitive     IMIPENEM <=0.25 SENSITIVE Sensitive     TRIMETH/SULFA <=20 SENSITIVE Sensitive      PIP/TAZO <=4 SENSITIVE Sensitive ug/mL    * RARE CITROBACTER AMALONATICUS   Streptococcus mitis/oralis - MIC*    PENICILLIN <=0.06 SENSITIVE Sensitive     CEFTRIAXONE 0.25 SENSITIVE Sensitive     LEVOFLOXACIN 1 SENSITIVE Sensitive     VANCOMYCIN 0.5 SENSITIVE Sensitive     * FEW STREPTOCOCCUS MITIS/ORALIS    Coagulation Studies: No results for input(s): "LABPROT", "INR" in the last 72 hours.   Urinalysis: No results for input(s): "COLORURINE", "LABSPEC", "PHURINE", "GLUCOSEU", "HGBUR", "BILIRUBINUR", "KETONESUR", "PROTEINUR", "UROBILINOGEN", "NITRITE", "LEUKOCYTESUR" in the last 72 hours.  Invalid input(s): "APPERANCEUR"    Imaging: No results found.      Medications:    sodium chloride 10 mL/hr at 12/15/23 1215    ceFAZolin (ANCEF) IV       amLODipine  10 mg Oral Daily   apixaban  2.5 mg Oral BID   Chlorhexidine Gluconate Cloth  6 each Topical Q0600   ciprofloxacin  250 mg Oral QHS   epoetin alfa-epbx (RETACRIT) injection  10,000 Units Intravenous Q M,W,F-HD   feeding supplement  237 mL Oral TID BM   insulin aspart  0-6 Units Subcutaneous TID WC   irbesartan  300 mg Oral Daily   lactulose  20 g Oral BID   metoprolol tartrate  25 mg Oral BID   multivitamin  1 tablet Oral QHS   pantoprazole  40 mg Oral Daily   tamsulosin  0.4 mg Oral Daily   acetaminophen, alum & mag hydroxide-simeth, antiseptic oral rinse, diphenhydrAMINE, famotidine, fentaNYL (SUBLIMAZE) injection, hydrALAZINE, hydrOXYzine, methylPREDNISolone (SOLU-MEDROL) injection, midazolam, ondansetron (ZOFRAN) IV  Assessment/ Plan:  Casey Franco is a 63 y.o.  male with HTN, ESRD, DM-2, Hep C.  Patient presents with chest pain and has been admitted for Intractable vomiting [R11.10] Abdominal pain [R10.9] Pneumonia of both lungs due to infectious organism, unspecified part of lung [J18.9] Sepsis without acute organ dysfunction, due to unspecified organism [facility]) [A41.9]  [facility]  [place]/MWF/right chest PermCath/71.0 kg  End-stage renal disease on hemodialysis.  Dialysis tolerated well yesterday. Next treatment scheduled for Wednesday, if remains inpatient.   2. Anemia of chronic kidney disease Lab Results  Component Value Date   HGB 8.2 (L) 12/15/2023    Hemoglobin 8.2.  Continue Epogen with dialysis treatments.  3. Secondary Hyperparathyroidism: with outpatient labs: PTH 351, phosphorus 6.6, calcium 8.1 on 11/23/2023.   Lab Results  Component Value Date   PTH 31 08/30/2018   CALCIUM 7.9 (L) 12/15/2023   PHOS 3.9 12/09/2023    Calcium and phosphorus within optimal range  4.  Hypertension with chronic kidney disease.  Home regimen includes furosemide, hydralazine, amlodipine, and irbesartan. Maintain the patient on amlodipine, Avapro, and metoprolol.  Blood pressure stable  5.  Cirrhosis/ascites, spontaneous bacterial peritonitis. Paracentesis from 11/27/2023 yielded 1.6 L of fluid.  Complex loculations suspected as more ascites present. Microbiology results show Staph epidermidis in the blood. Peritoneal fluid culture from 11/26/2023 shows Streptococcus and Citrobacter. Lactulose twice a day. Paracentesis on 12/02/23 with 4.6L removed. Fluid cultures negative  Patient remains on cipro.  Per rehab request, vascular will  perform permcath exchange.    LOS: 19 Patton Rabinovich 3/4/20251:34 PM

## 2023-12-15 NOTE — Progress Notes (Signed)
 Mobility Specialist - Progress Note   12/15/23 1100  Mobility  Activity Refused mobility     Pt declined mobility; reports just finished ambulating 2 laps in hallway with PT. Requests to rest til procedure today. Will attempt another date/time.    Filiberto Pinks Mobility Specialist 12/15/23, 11:40 AM

## 2023-12-15 NOTE — TOC Progression Note (Signed)
 Transition of Care [facility]) - Progression Note    Patient Details  Name: Casey Franco MRN: [id number] Date of Birth: 1961/04/09  Transition of [facility]) CM/SW Contact  Chapman Fitch, RN Phone Number: 12/15/2023, 2:12 PM  Clinical Narrative:      Per cath to be replaced today Ricky at [facility] confirms they can accept patient.  Requested Nitchia with TOC start auth      Expected Discharge Plan and Services                                               Social Determinants of Health (SDOH) Interventions SDOH Screenings   Food Insecurity: Patient Declined (11/28/2023)  Housing: Low Risk  (12/06/2023)  Transportation Needs: Patient Declined (11/28/2023)  Utilities: Not At Risk (11/28/2023)  Financial Resource Strain: Low Risk  (05/15/2020)   Received from [facility], [facility]  Tobacco Use: Medium Risk (11/26/2023)    Readmission Risk Interventions     No data to display

## 2023-12-15 NOTE — Progress Notes (Signed)
 Occupational Therapy Treatment Patient Details Name: Casey Franco MRN: [id number] DOB: 10-24-1960 Today's Date: 12/15/2023   History of present illness presented to ER secondary to abdominal pain, chest pain and coffee-ground emesis; admitted for management of spontaneous bacterial peritonitis.  s/p paracentesis with 1.6L removed on 2/13   OT comments  t is supine in bed on arrival. Initially complaining of having the catheter replacement today and not wanting to do much, but with encouragement is agreeable to OT session. He denies pain. Pt performed bed mobility with SUP and was able to tolerate sitting EOB for ~20 mins to perform grooming tasks and shave his face. PT joined session to progress his mobility, he needed CGA and verb cues to push up from EOB to RW for STS, then handoff to PT. Pt returned to bed with all needs in place and will cont to require skilled acute OT services to maximize his safety and IND to return to PLOF.      If plan is discharge home, recommend the following:  A little help with walking and/or transfers;Assistance with cooking/housework;Direct supervision/assist for financial management;Supervision due to cognitive status;Direct supervision/assist for medications management;Help with stairs or ramp for entrance;A little help with bathing/dressing/bathroom   Equipment Recommendations  Other (comment) (defer)    Recommendations for Other Services      Precautions / Restrictions Precautions Precautions: Fall Recall of Precautions/Restrictions: Impaired Precaution/Restrictions Comments: Very impulsive with mobility. Judgement/safety awareness seems to flucctuate. Restrictions Weight Bearing Restrictions Per Provider Order: No       Mobility Bed Mobility Overal bed mobility: Needs Assistance Bed Mobility: Supine to Sit     Supine to sit: HOB elevated, Used rails, Supervision     General bed mobility comments: SUP for bed mobility to reach EOB including  scooting    Transfers Overall transfer level: Needs assistance Equipment used: Rolling walker (2 wheels) Transfers: Sit to/from Stand Sit to Stand: Supervision, Contact guard assist           General transfer comment: vcs to push from bed to stand with CGA/SBA to RW     Balance Overall balance assessment: Needs assistance Sitting-balance support: Feet supported, No upper extremity supported Sitting balance-Leahy Scale: Good Sitting balance - Comments: no LOB while seated EOB 20 mins to perform grooming tasks                                   ADL either performed or assessed with clinical judgement   ADL Overall ADL's : Needs assistance/impaired     Grooming: Sitting;Wash/dry face;Set up Grooming Details (indicate cue type and reason): face washing and shaving his face with set up seated at EOB         Upper Body Dressing : Minimal assistance;Sitting Upper Body Dressing Details (indicate cue type and reason): MIN A to reach tie at back Lower Body Dressing: Maximal assistance;Sitting/lateral leans                      Extremity/Trunk Assessment              Vision       Perception     Praxis     Communication Communication Communication: No apparent difficulties   Cognition Arousal: Alert Behavior During Therapy: Agitated, Impulsive, Restless Cognition: Cognition impaired, Difficult to assess             OT - Cognition Comments: Variable  behavior/cognition during sesison. Easily agitated by verbal cueing, but by end of session is pleasant, thankful for opportunity to get cleaned up and engage in functional activity with author.                 Following commands: Intact        Cueing   Cueing Techniques: Verbal cues  Exercises      Shoulder Instructions       General Comments      Pertinent Vitals/ Pain       Pain Assessment Pain Assessment: No/denies pain Pain Intervention(s): Monitored during  session  Home Living                                          Prior Functioning/Environment              Frequency  Min 1X/week        Progress Toward Goals  OT Goals(current goals can now be found in the care plan section)  Progress towards OT goals: Progressing toward goals  Acute Rehab OT Goals Patient Stated Goal: go to rehab OT Goal Formulation: With patient Time For Goal Achievement: 12/29/23 Potential to Achieve Goals: Fair  Plan      Co-evaluation                 AM-PAC OT "6 Clicks" Daily Activity     Outcome Measure   Help from another person eating meals?: None Help from another person taking care of personal grooming?: None Help from another person toileting, which includes using toliet, bedpan, or urinal?: A Lot Help from another person bathing (including washing, rinsing, drying)?: A Lot Help from another person to put on and taking off regular upper body clothing?: A Little Help from another person to put on and taking off regular lower body clothing?: A Lot 6 Click Score: 17    End of Session Equipment Utilized During Treatment: Rolling walker (2 wheels)  OT Visit Diagnosis: Other abnormalities of gait and mobility (R26.89);Unsteadiness on feet (R26.81);Muscle weakness (generalized) (M62.81)   Activity Tolerance Patient tolerated treatment well   Patient Left  (handoff to PT)   Nurse Communication Mobility status        Time: 2956-2130 OT Time Calculation (min): 25 min  Charges: OT General Charges $OT Visit: 1 Visit OT Treatments $Self Care/Home Management : 23-37 mins  [name], OTR/L  12/15/23, 11:29 AM   Constance Goltz 12/15/2023, 11:29 AM

## 2023-12-15 NOTE — Progress Notes (Signed)
 Progress Note   Patient: Casey Franco ZOX:[id number] DOB: 1961/08/02 DOA: 11/26/2023     19 DOS: the patient was seen and examined on 12/15/2023      Brief hospital course: JAIR LINDBLAD is a 63 y.o. 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  He also complained of nausea, increasing abdominal distention and multiple episodes of vomiting.    Spontaneous bacterial peritonitis Abdominal pain Liver cirrhosis with ascites: S/p paracentesis with removal of 1600 mL of fluid on 11/26/2023.   Fluid positive for SBP (ascitic fluid total nucleated cells 1,326, percentage neutrophils was 68% and absolute PMN count 901) Ascitic fluid culture showed Streptococcus mitis and Citrobacter amalonaticus S/p repeat paracentesis on 12/01/2023 with removal of 4.6 L of fluid. Percentage neutrophil count was 100 and total nucleated cell count 4,687. He was evaluated by the general surgeon.  No indication for surgery at this time.   Has completed 2 weeks of antibiotics for SBP Continue on SBP prophylaxis with ciprofloxacin     Staph epidermidis bacteremia: 1 out of 4 bottles from 11/26/2023 positive for Staph epidermidis.  This is likely a contaminant.  Repeat blood cultures from 11/27/2023 has not shown any growth thus far.   Bacteroides  thetaiotaomicron bacteremia (blood cultures from 11/26/2023 growing gram-negative rods).   Has completed 2 weeks of metronidazole as recommended by infectious disease on 12/14/2018 ID pharmacist agreed     Hematemesis: Resolved.  This is probably from multiple episodes of vomiting.   H&H is stable.     Elevated troponins: Troponin is 172, 138, 127 and 128.  No chest pain or acute EKG changes. Due to demand ischemia.     Paroxysmal atrial fibrillation, PVCs: Heart rate is better.   Continue metoprolol.  We discussed risks, benefits and alternatives to long-term anticoagulation for atrial fibrillation. He is at increased risk for bleeding from long-term anticoagulation because of underlying severe liver and kidney disease. CHA2DS2-VASc score is 3 and he is also at increased risk for stroke. Patient has agreed to Eliquis Eliquis initiated as hemoglobin is stabilized   Chronic systolic CHF Moderate mitral regurgitation 2D echo showed EF estimated at 40 to 45%, moderate LVH, indeterminate left ventricular diastolic parameters, mildly reduced RV systolic function, moderately enlarged RV size, moderately dilated right atrium, small pericardial effusion, moderate mitral regurgitation --Volume management by dialysis   Confusion on 11/30/2023, likely acute toxic encephalopathy from oxycodone: Oxycodone has been discontinued. Continue delirium precaution     ESRD on hemodialysis MWF --Nephrology following for hemodialysis    Dysphagia: Speech therapist recommended dysphagia 2 diet, thin liquids.     General weakness: PT recommended discharge to SNF.   Follow-up with TOC to assist with placement.     Essential hypertension Continue current antihypertensives   History of type II DM Continue to monitor glucose Place on insulin therapy   Consultants: General surgery Interventional radiology   Procedures: Paracentesis with removal of 1,600 mL of fluid on 11/26/2023 Paracentesis with removal of 4.6 L of fluid on 12/01/2023    DVT prophylaxis: Eliquis       Code Status: Full Code   Family Communication: None   Disposition Plan:  SNF/rehab     Status is: Inpatient Remains inpatient appropriate because:  needs SNF placement - pending       Physical Exam: General exam: sleeping, wakes  to voice, no acute distress Respiratory system: normal respiratory effort.on room air, lungs clear Cardiovascular system: RRR, no edema, right upper chest perm cath in  place Gastrointestinal system: soft, NT, non-distended Central nervous system: no gross focal neurologic deficits Extremities: no edema, normal tone Psychiatry: normal mood, congruent affect     Subjective: Patient seen and examined at bedside this morning Patient underwent dialysis catheter exchange today Have been cleared for discharge pending placement He denies nausea vomiting abdominal pain or chest pain     Data Reviewed:      Latest Ref Rng & Units 12/15/2023    3:54 AM 12/14/2023    4:18 AM 12/13/2023    7:01 AM  BMP  Glucose 70 - 99 mg/dL 90  94  82   BUN 8 - 23 mg/dL 18  27  21    Creatinine 0.61 - 1.24 mg/dL 7.82  9.56  2.13   Sodium 135 - 145 mmol/L 133  135  135   Potassium 3.5 - 5.1 mmol/L 3.5  3.7  3.6   Chloride 98 - 111 mmol/L 98  100  101   CO2 22 - 32 mmol/L 24  22  24    Calcium 8.9 - 10.3 mg/dL 7.9  8.1  8.1      Vitals:   12/15/23 1430 12/15/23 1435 12/15/23 1443 12/15/23 1500  BP: 133/86 103/76 105/72 105/72  Pulse: 79 75 85 81  Resp: 13 17 16 14   Temp:      TempSrc:      SpO2: 92% 100% 98% 96%  Weight:      Height:          Latest Ref Rng & Units 12/15/2023    3:54 AM 12/14/2023    4:18 AM 12/13/2023    7:01 AM  CBC  WBC 4.0 - 10.5 K/uL 5.9  4.9  4.8   Hemoglobin 13.0 - 17.0 g/dL 8.2  8.3  8.4   Hematocrit 39.0 - 52.0 % 24.5  25.0  26.4   Platelets 150 - 400 K/uL 139  130  129      Author: Loyce Dys, MD 12/15/2023 3:31 PM  For on call review [email or web address].

## 2023-12-15 NOTE — Telephone Encounter (Signed)

## 2023-12-16 ENCOUNTER — Encounter: Payer: Self-pay | Admitting: Vascular Surgery

## 2023-12-16 DIAGNOSIS — R188 Other ascites: Secondary | ICD-10-CM | POA: Diagnosis not present

## 2023-12-16 DIAGNOSIS — K652 Spontaneous bacterial peritonitis: Secondary | ICD-10-CM | POA: Diagnosis not present

## 2023-12-16 DIAGNOSIS — K7460 Unspecified cirrhosis of liver: Secondary | ICD-10-CM | POA: Diagnosis not present

## 2023-12-16 DIAGNOSIS — Z992 Dependence on renal dialysis: Secondary | ICD-10-CM | POA: Insufficient documentation

## 2023-12-16 DIAGNOSIS — I132 Hypertensive heart and chronic kidney disease with heart failure and with stage 5 chronic kidney disease, or end stage renal disease: Secondary | ICD-10-CM | POA: Insufficient documentation

## 2023-12-16 DIAGNOSIS — I5022 Chronic systolic (congestive) heart failure: Secondary | ICD-10-CM | POA: Insufficient documentation

## 2023-12-16 DIAGNOSIS — L8990 Pressure ulcer of unspecified site, unspecified stage: Secondary | ICD-10-CM | POA: Insufficient documentation

## 2023-12-16 DIAGNOSIS — D696 Thrombocytopenia, unspecified: Secondary | ICD-10-CM | POA: Insufficient documentation

## 2023-12-16 DIAGNOSIS — N400 Enlarged prostate without lower urinary tract symptoms: Secondary | ICD-10-CM | POA: Insufficient documentation

## 2023-12-16 DIAGNOSIS — N186 End stage renal disease: Secondary | ICD-10-CM | POA: Diagnosis not present

## 2023-12-16 LAB — BASIC METABOLIC PANEL
Anion gap: 10 (ref 5–15)
BUN: 28 mg/dL — ABNORMAL HIGH (ref 8–23)
CO2: 23 mmol/L (ref 22–32)
Calcium: 8.2 mg/dL — ABNORMAL LOW (ref 8.9–10.3)
Chloride: 100 mmol/L (ref 98–111)
Creatinine, Ser: 6.28 mg/dL — ABNORMAL HIGH (ref 0.61–1.24)
GFR, Estimated: 9 mL/min — ABNORMAL LOW (ref >60)
Glucose, Bld: 126 mg/dL — ABNORMAL HIGH (ref 70–99)
Potassium: 3.9 mmol/L (ref 3.5–5.1)
Sodium: 133 mmol/L — ABNORMAL LOW (ref 135–145)

## 2023-12-16 LAB — CBC WITH DIFFERENTIAL/PLATELET
Abs Immature Granulocytes: 0.04 10*3/uL (ref 0.00–0.07)
Basophils Absolute: 0.1 10*3/uL (ref 0.0–0.1)
Basophils Relative: 1 %
Eosinophils Absolute: 0.1 10*3/uL (ref 0.0–0.5)
Eosinophils Relative: 1 %
HCT: 26.4 % — ABNORMAL LOW (ref 39.0–52.0)
Hemoglobin: 8.4 g/dL — ABNORMAL LOW (ref 13.0–17.0)
Immature Granulocytes: 1 %
Lymphocytes Relative: 17 %
Lymphs Abs: 1 10*3/uL (ref 0.7–4.0)
MCH: 29 pg (ref 26.0–34.0)
MCHC: 31.8 g/dL (ref 30.0–36.0)
MCV: 91 fL (ref 80.0–100.0)
Monocytes Absolute: 1.1 10*3/uL — ABNORMAL HIGH (ref 0.1–1.0)
Monocytes Relative: 19 %
Neutro Abs: 3.6 10*3/uL (ref 1.7–7.7)
Neutrophils Relative %: 61 %
Platelets: 144 10*3/uL — ABNORMAL LOW (ref 150–400)
RBC: 2.9 MIL/uL — ABNORMAL LOW (ref 4.22–5.81)
RDW: 16.2 % — ABNORMAL HIGH (ref 11.5–15.5)
WBC: 5.9 10*3/uL (ref 4.0–10.5)
nRBC: 0 % (ref 0.0–0.2)

## 2023-12-16 LAB — GLUCOSE, CAPILLARY
Glucose-Capillary: 95 mg/dL (ref 70–99)
Glucose-Capillary: 119 mg/dL — ABNORMAL HIGH (ref 70–99)
Glucose-Capillary: 119 mg/dL — ABNORMAL HIGH (ref 70–99)

## 2023-12-16 LAB — PHOSPHORUS: Phosphorus: 3.3 mg/dL (ref 2.5–4.6)

## 2023-12-16 MED ORDER — ALTEPLASE 2 MG IJ SOLR: 2.0000 mg | Freq: Once | INTRAMUSCULAR | Status: DC | PRN

## 2023-12-16 MED ORDER — HEPARIN SODIUM (PORCINE) 1000 UNIT/ML DIALYSIS: 1000.0000 [IU] | INTRAMUSCULAR | Status: DC | PRN

## 2023-12-16 MED ORDER — EPOETIN ALFA-EPBX 10000 UNIT/ML IJ SOLN
INTRAMUSCULAR | Status: AC
  Filled 2023-12-16: qty 1

## 2023-12-16 NOTE — Progress Notes (Signed)
 [facility]  ROUNDING NOTE   Subjective:   Casey Franco is a 63 y.o. male with past medical history of HTN, ESRD, DM-2, and Hep C. Patient presents to emergency department with abdominal pain. He has been admitted for Intractable vomiting [R11.10] Abdominal pain [R10.9] Pneumonia of both lungs due to infectious organism, unspecified part of lung [J18.9] Sepsis without acute organ dysfunction, due to unspecified organism [facility]) [A41.9]  Patient is known to our practice and receives outpatient dialysis at [facility] on a MWF, last treatment on Tuesday.    Update Patient seen and evaluated during dialysis   HEMODIALYSIS FLOWSHEET:  Blood Flow Rate (mL/min): 299 mL/min Arterial Pressure (mmHg): -106.66 mmHg Venous Pressure (mmHg): 108.68 mmHg TMP (mmHg): 7.07 mmHg Ultrafiltration Rate (mL/min): 429 mL/min Dialysate Flow Rate (mL/min): 299 ml/min  Denies pain or discomfort Bleeding noted overnight, pressure applied until stopped.    Objective:  Vital signs in last 24 hours:  Temp:  [98 F (36.7 C)-99.7 F (37.6 C)] 98.6 F (37 C) (03/05 0915) Pulse Rate:  [0-85] 79 (03/05 1100) Resp:  [13-25] 15 (03/05 1100) BP: (100-143)/(70-97) 115/76 (03/05 1100) SpO2:  [92 %-100 %] 99 % (03/05 1100) Weight:  [61.9 kg] 61.9 kg (03/05 0915)  Weight change:  Filed Weights   12/14/23 1001 12/14/23 1320 12/16/23 0915  Weight: 60.6 kg 60.6 kg 61.9 kg    Intake/Output: No intake/output data recorded.   Intake/Output this shift:  No intake/output data recorded.  Physical Exam: General: NAD  Head: Normocephalic, atraumatic. Moist oral mucosal membranes  Eyes: Anicteric  Lungs:  Clear to auscultation, normal effort  Heart: Regular rate and rhythm, aflutter  Abdomen:  Soft, tender, mild distension  Extremities: No peripheral edema.  Neurologic: Alert and oriented, moving all four extremities  Skin: No lesions  Access: Right chest tunneled catheter exchanged  on 12/15/23    Basic Metabolic Panel: Recent Labs  Lab 12/12/23 0544 12/13/23 0701 12/14/23 0418 12/15/23 0354 12/16/23 0414  NA 137 135 135 133* 133*  K 3.7 3.6 3.7 3.5 3.9  CL 104 101 100 98 100  CO2 24 24 22 24 23   GLUCOSE 88 82 94 90 126*  BUN 14 21 27* 18 28*  CREATININE 4.07* 5.79* 6.72* 4.48* 6.28*  CALCIUM 8.1* 8.1* 8.1* 7.9* 8.2*    Liver Function Tests: No results for input(s): "AST", "ALT", "ALKPHOS", "BILITOT", "PROT", "ALBUMIN" in the last 168 hours.  No results for input(s): "LIPASE", "AMYLASE" in the last 168 hours.  No results for input(s): "AMMONIA" in the last 168 hours.   CBC: Recent Labs  Lab 12/12/23 0544 12/13/23 0701 12/14/23 0418 12/15/23 0354 12/16/23 0414  WBC 4.5 4.8 4.9 5.9 5.9  NEUTROABS 2.7 2.8 3.1 3.8 3.6  HGB 8.0* 8.4* 8.3* 8.2* 8.4*  HCT 24.6* 26.4* 25.0* 24.5* 26.4*  MCV 92.5 90.4 89.9 89.1 91.0  PLT 122* 129* 130* 139* 144*    Cardiac Enzymes: No results for input(s): "CKTOTAL", "CKMB", "CKMBINDEX", "TROPONINI" in the last 168 hours.  BNP: Invalid input(s): "POCBNP"  CBG: Recent Labs  Lab 12/14/23 2122 12/15/23 0806 12/15/23 1143 12/15/23 1704 12/15/23 2308  GLUCAP 133* 85 83 89 122*    Microbiology: Results for orders placed or performed during the hospital encounter of 11/26/23  Culture, blood (Routine x 2)     Status: Abnormal   Collection Time: 11/26/23 11:22 AM   Specimen: BLOOD  Result Value Ref Range Status   Specimen Description   Final  BLOOD RIGHT ANTECUBITAL Performed at [facility], [address] [place], [place] [address]    Special Requests   Final    BOTTLES DRAWN AEROBIC AND ANAEROBIC Blood Culture results may not be optimal due to an inadequate volume of blood received in culture bottles Performed at [facility], [address] [place], [place] [address]    Culture  Setup Time   Final    ANAEROBIC BOTTLE ONLY GRAM NEGATIVE RODS CRITICAL RESULT CALLED TO, READ  BACK BY AND VERIFIED WITH: CAROLINE COULTER @ 11/28/23 2033 AB    Culture (A)  Final    BACTEROIDES THETAIOTAOMICRON BETA LACTAMASE POSITIVE Performed at [facility], [address] [place], [place] [address]    Report Status 12/01/2023 FINAL  Final  Blood Culture ID Panel (Reflexed)     Status: None   Collection Time: 11/26/23 11:22 AM  Result Value Ref Range Status   Enterococcus faecalis NOT DETECTED NOT DETECTED Final   Enterococcus Faecium NOT DETECTED NOT DETECTED Final   Listeria monocytogenes NOT DETECTED NOT DETECTED Final   Staphylococcus species NOT DETECTED NOT DETECTED Final   Staphylococcus aureus (BCID) NOT DETECTED NOT DETECTED Final   Staphylococcus epidermidis NOT DETECTED NOT DETECTED Final   Staphylococcus lugdunensis NOT DETECTED NOT DETECTED Final   Streptococcus species NOT DETECTED NOT DETECTED Final   Streptococcus agalactiae NOT DETECTED NOT DETECTED Final   Streptococcus pneumoniae NOT DETECTED NOT DETECTED Final   Streptococcus pyogenes NOT DETECTED NOT DETECTED Final   A.calcoaceticus-baumannii NOT DETECTED NOT DETECTED Final   Bacteroides fragilis NOT DETECTED NOT DETECTED Final   Enterobacterales NOT DETECTED NOT DETECTED Final   Enterobacter cloacae complex NOT DETECTED NOT DETECTED Final   Escherichia coli NOT DETECTED NOT DETECTED Final   Klebsiella aerogenes NOT DETECTED NOT DETECTED Final   Klebsiella oxytoca NOT DETECTED NOT DETECTED Final   Klebsiella pneumoniae NOT DETECTED NOT DETECTED Final   Proteus species NOT DETECTED NOT DETECTED Final   Salmonella species NOT DETECTED NOT DETECTED Final   Serratia marcescens NOT DETECTED NOT DETECTED Final   Haemophilus influenzae NOT DETECTED NOT DETECTED Final   Neisseria meningitidis NOT DETECTED NOT DETECTED Final   Pseudomonas aeruginosa NOT DETECTED NOT DETECTED Final   Stenotrophomonas maltophilia NOT DETECTED NOT DETECTED Final   Candida albicans NOT DETECTED NOT DETECTED Final    Candida auris NOT DETECTED NOT DETECTED Final   Candida glabrata NOT DETECTED NOT DETECTED Final   Candida krusei NOT DETECTED NOT DETECTED Final   Candida parapsilosis NOT DETECTED NOT DETECTED Final   Candida tropicalis NOT DETECTED NOT DETECTED Final   Cryptococcus neoformans/gattii NOT DETECTED NOT DETECTED Final    Comment: Performed at [facility], [address] [place], [place] [address]  Culture, blood (Routine x 2)     Status: Abnormal   Collection Time: 11/26/23 12:23 PM   Specimen: BLOOD LEFT ARM  Result Value Ref Range Status   Specimen Description   Final    BLOOD LEFT ARM Performed at [facility], [address] [place], [place] [address]    Special Requests   Final    BOTTLES DRAWN AEROBIC AND ANAEROBIC Blood Culture results may not be optimal due to an inadequate volume of blood received in culture bottles Performed at [facility], [address], [place], [place] [address]    Culture  Setup Time   Final    GRAM POSITIVE COCCI AEROBIC BOTTLE ONLY CRITICAL RESULT CALLED TO, READ  BACK BY AND VERIFIED WITH: BRIANA ALLEY ON 11/27/23 AT 1107 QSD    Culture (A)  Final    STAPHYLOCOCCUS EPIDERMIDIS THE SIGNIFICANCE OF ISOLATING THIS ORGANISM FROM A SINGLE SET OF BLOOD CULTURES WHEN MULTIPLE SETS ARE DRAWN IS UNCERTAIN. PLEASE NOTIFY THE MICROBIOLOGY DEPARTMENT WITHIN ONE WEEK IF SPECIATION AND SENSITIVITIES ARE REQUIRED. Performed at [facility], [address], [place], [place] [address]    Report Status 11/29/2023 FINAL  Final  Blood Culture ID Panel (Reflexed)     Status: Abnormal   Collection Time: 11/26/23 12:23 PM  Result Value Ref Range Status   Enterococcus faecalis NOT DETECTED NOT DETECTED Final   Enterococcus Faecium NOT DETECTED NOT DETECTED Final   Listeria monocytogenes NOT DETECTED NOT DETECTED Final   Staphylococcus species DETECTED (A) NOT DETECTED Final    Comment: CRITICAL RESULT CALLED TO, READ BACK BY AND  VERIFIED WITH: BRIANA ALLEY ON 11/27/23 AT 1107 QSD    Staphylococcus aureus (BCID) NOT DETECTED NOT DETECTED Final   Staphylococcus epidermidis DETECTED (A) NOT DETECTED Final    Comment: CRITICAL RESULT CALLED TO, READ BACK BY AND VERIFIED WITH: BRIANA ALLEY ON 11/27/23 AT 1107 QSD    Staphylococcus lugdunensis NOT DETECTED NOT DETECTED Final   Streptococcus species NOT DETECTED NOT DETECTED Final   Streptococcus agalactiae NOT DETECTED NOT DETECTED Final   Streptococcus pneumoniae NOT DETECTED NOT DETECTED Final   Streptococcus pyogenes NOT DETECTED NOT DETECTED Final   A.calcoaceticus-baumannii NOT DETECTED NOT DETECTED Final   Bacteroides fragilis NOT DETECTED NOT DETECTED Final   Enterobacterales NOT DETECTED NOT DETECTED Final   Enterobacter cloacae complex NOT DETECTED NOT DETECTED Final   Escherichia coli NOT DETECTED NOT DETECTED Final   Klebsiella aerogenes NOT DETECTED NOT DETECTED Final   Klebsiella oxytoca NOT DETECTED NOT DETECTED Final   Klebsiella pneumoniae NOT DETECTED NOT DETECTED Final   Proteus species NOT DETECTED NOT DETECTED Final   Salmonella species NOT DETECTED NOT DETECTED Final   Serratia marcescens NOT DETECTED NOT DETECTED Final   Haemophilus influenzae NOT DETECTED NOT DETECTED Final   Neisseria meningitidis NOT DETECTED NOT DETECTED Final   Pseudomonas aeruginosa NOT DETECTED NOT DETECTED Final   Stenotrophomonas maltophilia NOT DETECTED NOT DETECTED Final   Candida albicans NOT DETECTED NOT DETECTED Final   Candida auris NOT DETECTED NOT DETECTED Final   Candida glabrata NOT DETECTED NOT DETECTED Final   Candida krusei NOT DETECTED NOT DETECTED Final   Candida parapsilosis NOT DETECTED NOT DETECTED Final   Candida tropicalis NOT DETECTED NOT DETECTED Final   Cryptococcus neoformans/gattii NOT DETECTED NOT DETECTED Final   Methicillin resistance mecA/C NOT DETECTED NOT DETECTED Final    Comment: Performed at [facility], [address] [place], [place] [address]  Body fluid culture w Gram Stain     Status: None   Collection Time: 11/26/23  8:55 PM   Specimen: PATH Cytology Peritoneal fluid  Result Value Ref Range Status   Specimen Description   Final    PERITONEAL CYTO Performed at [facility], [address], [place], [place] [address]    Special Requests   Final    NONE Performed at [facility], [address] [place], [place] [address]    Gram Stain   Final    FEW WBC PRESENT,BOTH PMN AND MONONUCLEAR FEW GRAM POSITIVE COCCI IN CHAINS MODERATE GRAM NEGATIVE RODS FEW GRAM POSITIVE RODS Performed at [facility], [address], [place], [place] [address]  Culture   Final    MODERATE STREPTOCOCCUS MITIS/ORALIS RARE CITROBACTER AMALONATICUS    Report Status 11/30/2023 FINAL  Final   Organism ID, Bacteria STREPTOCOCCUS MITIS/ORALIS  Final   Organism ID, Bacteria CITROBACTER AMALONATICUS  Final      Susceptibility   Citrobacter amalonaticus - MIC*    CEFEPIME <=0.12 SENSITIVE Sensitive     CEFTAZIDIME <=1 SENSITIVE Sensitive     CEFTRIAXONE <=0.25 SENSITIVE Sensitive     CIPROFLOXACIN <=0.25 SENSITIVE Sensitive     GENTAMICIN <=1 SENSITIVE Sensitive     IMIPENEM <=0.25 SENSITIVE Sensitive     TRIMETH/SULFA <=20 SENSITIVE Sensitive     PIP/TAZO <=4 SENSITIVE Sensitive ug/mL    * RARE CITROBACTER AMALONATICUS   Streptococcus mitis/oralis - MIC*    PENICILLIN 0.25 INTERMEDIATE Intermediate     CEFTRIAXONE 0.25 SENSITIVE Sensitive     LEVOFLOXACIN 1 SENSITIVE Sensitive     VANCOMYCIN 0.5 SENSITIVE Sensitive     * MODERATE STREPTOCOCCUS MITIS/ORALIS  Culture, blood (Routine X 2) w Reflex to ID Panel     Status: None   Collection Time: 11/28/23  5:43 AM   Specimen: BLOOD LEFT ARM  Result Value Ref Range Status   Specimen Description BLOOD LEFT ARM  Final   Special Requests   Final    BOTTLES DRAWN AEROBIC AND ANAEROBIC Blood Culture results may not be optimal due to an  inadequate volume of blood received in culture bottles   Culture   Final    NO GROWTH 5 DAYS Performed at [facility], [address] [place], [place] [address]    Report Status 12/03/2023 FINAL  Final  Culture, blood (Routine X 2) w Reflex to ID Panel     Status: None   Collection Time: 11/28/23  5:47 AM   Specimen: BLOOD RIGHT HAND  Result Value Ref Range Status   Specimen Description BLOOD RIGHT HAND  Final   Special Requests   Final    BOTTLES DRAWN AEROBIC AND ANAEROBIC Blood Culture results may not be optimal due to an inadequate volume of blood received in culture bottles   Culture   Final    NO GROWTH 5 DAYS Performed at [facility], [address], [place], [place] [address]    Report Status 12/03/2023 FINAL  Final  Body fluid culture w Gram Stain     Status: None   Collection Time: 12/01/23 10:10 AM   Specimen: Ascitic; Body Fluid  Result Value Ref Range Status   Specimen Description   Final    ASCITIC Performed at [facility], [address] [place], [place] [address]    Special Requests   Final    peri Performed at [facility], [address] [place], [place] [address]    Gram Stain   Final    FEW WBC PRESENT, PREDOMINANTLY PMN ABUNDANT GRAM NEGATIVE RODS ABUNDANT GRAM POSITIVE COCCI RARE GRAM POSITIVE RODS    Culture   Final    FEW STREPTOCOCCUS MITIS/ORALIS RARE CITROBACTER AMALONATICUS MODERATE BACTEROIDES THETAIOTAOMICRON BETA LACTAMASE POSITIVE RARE EIKENELLA CORRODENS Usually susceptible to penicillin and other beta lactam agents,quinolones,macrolides and tetracyclines. Performed at [facility], [address] [place], [place] [address]    Report Status 12/06/2023 FINAL  Final   Organism ID, Bacteria STREPTOCOCCUS MITIS/ORALIS  Final   Organism ID, Bacteria CITROBACTER AMALONATICUS  Final      Susceptibility   Citrobacter amalonaticus - MIC*    CEFEPIME <=0.12 SENSITIVE Sensitive  CEFTAZIDIME <=1 SENSITIVE Sensitive     CEFTRIAXONE <=0.25 SENSITIVE Sensitive     CIPROFLOXACIN <=0.25 SENSITIVE Sensitive     GENTAMICIN <=1 SENSITIVE Sensitive     IMIPENEM <=0.25 SENSITIVE Sensitive     TRIMETH/SULFA <=20 SENSITIVE Sensitive     PIP/TAZO <=4 SENSITIVE Sensitive ug/mL    * RARE CITROBACTER AMALONATICUS   Streptococcus mitis/oralis - MIC*    PENICILLIN <=0.06 SENSITIVE Sensitive     CEFTRIAXONE 0.25 SENSITIVE Sensitive     LEVOFLOXACIN 1 SENSITIVE Sensitive     VANCOMYCIN 0.5 SENSITIVE Sensitive     * FEW STREPTOCOCCUS MITIS/ORALIS    Coagulation Studies: No results for input(s): "LABPROT", "INR" in the last 72 hours.   Urinalysis: No results for input(s): "COLORURINE", "LABSPEC", "PHURINE", "GLUCOSEU", "HGBUR", "BILIRUBINUR", "KETONESUR", "PROTEINUR", "UROBILINOGEN", "NITRITE", "LEUKOCYTESUR" in the last 72 hours.  Invalid input(s): "APPERANCEUR"    Imaging: PERIPHERAL VASCULAR CATHETERIZATION Result Date: 12/15/2023 See surgical note for result.       Medications:       amLODipine  10 mg Oral Daily   apixaban  2.5 mg Oral BID   Chlorhexidine Gluconate Cloth  6 each Topical Q0600   ciprofloxacin  250 mg Oral QHS   epoetin alfa-epbx (RETACRIT) injection  10,000 Units Intravenous Q M,W,F-HD   feeding supplement  237 mL Oral TID BM   insulin aspart  0-6 Units Subcutaneous TID WC   irbesartan  300 mg Oral Daily   lactulose  20 g Oral BID   metoprolol tartrate  25 mg Oral BID   multivitamin  1 tablet Oral QHS   pantoprazole  40 mg Oral Daily   tamsulosin  0.4 mg Oral Daily   acetaminophen, alteplase, alum & mag hydroxide-simeth, antiseptic oral rinse, heparin, hydrALAZINE, hydrOXYzine, ondansetron (ZOFRAN) IV  Assessment/ Plan:  Casey Franco is a 63 y.o.  male with HTN, ESRD, DM-2, Hep C.  Patient presents with chest pain and has been admitted for Intractable vomiting [R11.10] Abdominal pain [R10.9] Pneumonia of both lungs due to  infectious organism, unspecified part of lung [J18.9] Sepsis without acute organ dysfunction, due to unspecified organism [facility]) [A41.9]  [facility]/MWF/right chest PermCath/71.0 kg  End-stage renal disease on hemodialysis.  Receiving dialysis today, UF 0. Next treatment scheduled for Friday. Awaiting rehab placement.  Appreciate vascular surgery performing PermCath exchange.  2. Anemia of chronic kidney disease Lab Results  Component Value Date   HGB 8.4 (L) 12/16/2023    Hemoglobin 8.4.  Continue Epogen 10,000 units IV with dialysis treatments.  3. Secondary Hyperparathyroidism: with outpatient labs: PTH 351, phosphorus 6.6, calcium 8.1 on 11/23/2023.   Lab Results  Component Value Date   PTH 31 08/30/2018   CALCIUM 8.2 (L) 12/16/2023   PHOS 3.9 12/09/2023    Will continue to monitor bone minerals.  Awaiting updated phosphorus level.  4.  Hypertension with chronic kidney disease.  Home regimen includes furosemide, hydralazine, amlodipine, and irbesartan. Amlodipine, Avapro, and metoprolol currently prescribed.  Blood pressure during dialysis is 119/77.  Continue current regimen.  5.  Cirrhosis/ascites, spontaneous bacterial peritonitis. Paracentesis from 11/27/2023 yielded 1.6 L of fluid.  Complex loculations suspected as more ascites present. Microbiology results show Staph epidermidis in the blood. Peritoneal fluid culture from 11/26/2023 shows Streptococcus and Citrobacter. Lactulose twice a day. Paracentesis on 12/02/23 with 4.6L removed. Fluid cultures negative  Patient remains on cipro.     LOS: 20 Gardy Montanari 3/5/202511:58 AM

## 2023-12-16 NOTE — Progress Notes (Signed)
 Compression dressing continue of HD catheter of RIJ. No breakthrough bleeding at this time. Dressing will remain in place. Pt encouraged to limit use of RUE.

## 2023-12-16 NOTE — Progress Notes (Signed)
 Pt noted with moderate bright red blood of HD catheter of RIJ. Cpmpression applied compression dressing left in place. Pt educated to limit use to control bleeding. MD Schnier rounding and aware MD Zhang notified as well. Pt asymptomatic.

## 2023-12-16 NOTE — TOC Progression Note (Signed)
 Transition of Care [facility]) - Progression Note    Patient Details  Name: Casey Franco MRN: [id number] Date of Birth: July 07, 1961  Transition [facility]) CM/SW Contact  Chapman Fitch, RN Phone Number: 12/16/2023, 2:51 PM  Clinical Narrative:    Insurance auth pending        Expected Discharge Plan and Services         Expected Discharge Date: 12/15/23                                     Social Determinants of Health (SDOH) Interventions SDOH Screenings   Food Insecurity: Patient Declined (11/28/2023)  Housing: Low Risk  (12/06/2023)  Transportation Needs: Patient Declined (11/28/2023)  Utilities: Not At Risk (11/28/2023)  Financial Resource Strain: Low Risk  (05/15/2020)   Received from [facility], [facility]  Tobacco Use: Medium Risk (11/26/2023)    Readmission Risk Interventions     No data to display

## 2023-12-16 NOTE — Plan of Care (Signed)
  Problem: Clinical Measurements: Goal: Respiratory complications will improve Outcome: Progressing   Problem: Nutrition: Goal: Adequate nutrition will be maintained Outcome: Progressing   Problem: Coping: Goal: Level of anxiety will decrease Outcome: Progressing   Problem: Elimination: Goal: Will not experience complications related to bowel motility Outcome: Progressing Goal: Will not experience complications related to urinary retention Outcome: Progressing   Problem: Pain Managment: Goal: General experience of comfort will improve and/or be controlled Outcome: Progressing   Problem: Safety: Goal: Ability to remain free from injury will improve Outcome: Progressing   Problem: Skin Integrity: Goal: Risk for impaired skin integrity will decrease Outcome: Progressing

## 2023-12-16 NOTE — Hospital Course (Addendum)
 Casey Franco is a 62 y.o. 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  He also complained of nausea, increasing abdominal distention and multiple episodes of vomiting.  Patient is diagnosed with spontaneous bacterial peritonitis, he has completed 2 weeks of antibiotics, currently on prophylactic Cipro. Patient also had a paracentesis on 2/18 with removal of 4.6 L of fluids. Patient condition has improved, patient parents has denied the patient nursing home placement, patient decided to appeal for the discharge.  But so far, appeal has not been submitted.  Patient refused to go home.

## 2023-12-16 NOTE — TOC Progression Note (Signed)
 Transition of Care [facility]) - Progression Note    Patient Details  Name: Casey Franco MRN: [id number] Date of Birth: 03-Dec-1960  Transition of Care [facility]) CM/SW Contact  Chapman Fitch, RN Phone Number: 12/16/2023, 8:39 AM  Clinical Narrative:      Insurance auth pending for [employer]         Expected Discharge Date: 12/15/23                                     Social Determinants of Health (SDOH) Interventions SDOH Screenings   Food Insecurity: Patient Declined (11/28/2023)  Housing: Low Risk  (12/06/2023)  Transportation Needs: Patient Declined (11/28/2023)  Utilities: Not At Risk (11/28/2023)  Financial Resource Strain: Low Risk  (05/15/2020)   Received from [facility], [facility]  Tobacco Use: Medium Risk (11/26/2023)    Readmission Risk Interventions     No data to display

## 2023-12-16 NOTE — Progress Notes (Signed)
  Progress Note    12/16/2023 9:45 AM 1 Day Post-Op  Subjective:  Casey Franco is a 63 year old male now postoperative day 1 from right IJ tunneled dialysis catheter exchange.  Patient is resting comfortably in bed in hemodialysis this morning.  Patient's catheter is running well.  No complaints overnight vitals all remained stable.   Vitals:   12/16/23 0314 12/16/23 0915  BP: 116/83 104/73  Pulse: 81 79  Resp: 20 (!) 24  Temp: 98.3 F (36.8 C) 98.6 F (37 C)  SpO2: 100% 99%   Physical Exam: Cardiac:  RRR, S1 and S2.  No murmurs appreciated Lungs: Lungs are clear on auscultation throughout.  No rales rhonchi or wheezing. Incisions: Right chest catheter exchange with dressing.  Clean dry and intact.  No hematoma seroma or infection to note. Extremities: All extremities with palpable pulses and warm to touch. Abdomen: Positive bowel sounds throughout, soft, nontender and nondistended. Neurologic: Alert and oriented x 4, answers questions and follows commands appropriately.  CBC    Component Value Date/Time   WBC 5.9 12/16/2023 0414   RBC 2.90 (L) 12/16/2023 0414   HGB 8.4 (L) 12/16/2023 0414   HGB 9.0 (L) 11/04/2021 1319   HCT 26.4 (L) 12/16/2023 0414   HCT 25.9 (L) 11/04/2021 1319   PLT 144 (L) 12/16/2023 0414   PLT 145 (L) 11/04/2021 1319   MCV 91.0 12/16/2023 0414   MCV 92 11/04/2021 1319   MCH 29.0 12/16/2023 0414   MCHC 31.8 12/16/2023 0414   RDW 16.2 (H) 12/16/2023 0414   RDW 14.2 11/04/2021 1319   LYMPHSABS 1.0 12/16/2023 0414   MONOABS 1.1 (H) 12/16/2023 0414   EOSABS 0.1 12/16/2023 0414   BASOSABS 0.1 12/16/2023 0414    BMET    Component Value Date/Time   NA 133 (L) 12/16/2023 0414   NA 142 11/04/2021 1319   K 3.9 12/16/2023 0414   CL 100 12/16/2023 0414   CO2 23 12/16/2023 0414   GLUCOSE 126 (H) 12/16/2023 0414   BUN 28 (H) 12/16/2023 0414   BUN 16 11/04/2021 1319   CREATININE 6.28 (H) 12/16/2023 0414   CALCIUM 8.2 (L) 12/16/2023 0414   GFRNONAA  9 (L) 12/16/2023 0414   GFRAA 15 (L) 09/01/2018 0412    INR    Component Value Date/Time   INR 1.9 (H) 11/26/2023 1223    No intake or output data in the 24 hours ending 12/16/23 0945   Assessment/Plan:  63 y.o. male is s/p dialysis permacatheter exchange.  1 Day Post-Op   PLAN Okay per vascular surgery for exchange catheter to be used for hemodialysis. Medications as needed. Okay to discharge when medically stable.  DVT prophylaxis: Heparin with dialysis   [facility] 12/16/2023 9:45 AM

## 2023-12-16 NOTE — Plan of Care (Signed)

## 2023-12-16 NOTE — Progress Notes (Signed)
 Hemodialysis Note:  Received patient in bed to unit. Alert and oriented. Informed consent singed and in chart.  Treatment initiated: 0943 Treatment completed: 1319  Access used: Right internal jugular catheter Access issues: None  Patient tolerated well. Transported back to room, alert without acute distress. Report given to patient's RN.  Total UF removed: 0 Medications given: Retacrit 02725 units IV  Post HD weight: 61.9 Kg  [facility]

## 2023-12-16 NOTE — Progress Notes (Signed)
 Progress Note   Patient: Casey Franco HKV:[id number] DOB: 09-27-1961 DOA: 11/26/2023     20 DOS: the patient was seen and examined on 12/16/2023   Brief hospital course: Casey Franco is a 63 y.o. 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  He also complained of nausea, increasing abdominal distention and multiple episodes of vomiting.  Patient is diagnosed with spontaneous bacterial peritonitis, he has completed 2 weeks of antibiotics, currently on prophylactic Cipro. Patient also had a paracentesis on 2/18 with removal of 4.6 L of fluids. Patient condition has improved, currently pending nursing home placement.   Principal Problem:   Spontaneous bacterial peritonitis (HCC) Active Problems:   Abdominal pain   Cirrhosis of liver with ascites (HCC)   Myocardial injury   Essential hypertension   ESRD (end stage renal disease) (HCC)   Coffee ground emesis   Paroxysmal atrial fibrillation (HCC)   Sepsis without acute organ dysfunction (HCC)   Assessment and Plan:  Spontaneous bacterial peritonitis Abdominal pain Liver cirrhosis with ascites: S/p paracentesis with removal of 1600 mL of fluid on 11/26/2023.   Fluid positive for SBP (ascitic fluid total nucleated cells 1,326, percentage neutrophils was 68% and absolute PMN count 901) Ascitic fluid culture showed Streptococcus mitis and Citrobacter amalonaticus S/p repeat paracentesis on 12/01/2023 with removal of 4.6 L of fluid. Percentage neutrophil count was 100 and total nucleated cell count 4,687. He was evaluated by the general surgeon.  No indication for surgery at this time.   Has completed 2 weeks of antibiotics for SBP Continue on SBP prophylaxis with ciprofloxacin Patient current condition stable, abdominal distention at baseline, no need  for repeat paracentesis.     Bacteroides  thetaiotaomicron bacteremia (blood cultures from 11/26/2023 growing gram-negative rods).   Has completed 2 weeks of metronidazole as recommended by infectious disease on 12/14/2018 Blood culture also positive for staph epi which he appeared to be a contaminant.     Hematemesis: Resolved.  This is probably from multiple episodes of vomiting.   H&H is stable.     Elevated troponins: Troponin is 172, 138, 127 and 128.  No chest pain or acute EKG changes. Due to demand ischemia.     Paroxysmal atrial fibrillation, PVCs: Heart rate is better.  Continue metoprolol.  We discussed risks, benefits and alternatives to long-term anticoagulation for atrial fibrillation. He is at increased risk for bleeding from long-term anticoagulation because of underlying severe liver and kidney disease. CHA2DS2-VASc score is 3 and he is also at increased risk for stroke. Patient has agreed to Eliquis Eliquis initiated as hemoglobin is stabilized   Chronic systolic CHF Moderate mitral regurgitation 2D echo showed EF estimated at 40 to 45%, moderate LVH, indeterminate left ventricular diastolic parameters, mildly reduced RV systolic function, moderately enlarged RV size, moderately dilated right atrium, small pericardial effusion, moderate mitral regurgitation --Volume management by dialysis   Confusion on 11/30/2023, likely acute toxic encephalopathy from oxycodone: Oxycodone has been discontinued. Continue delirium precaution     ESRD on hemodialysis MWF Hyponatremia. --Nephrology following for hemodialysis    Dysphagia: Speech therapist recommended dysphagia 2 diet, thin liquids.     General weakness: PT recommended discharge to SNF.   Follow-up with TOC to assist with placement.     Essential hypertension Continue current antihypertensives   type II DM  Glucose not significant elevated.  Subjective:  Patient currently doing well, no  complaint.  Physical Exam: Vitals:   12/16/23 1130 12/16/23 1200 12/16/23 1230 12/16/23 1319  BP: 125/77 119/77 127/76 120/74  Pulse: 80 79 80 79  Resp: (!) 21 20 15 17   Temp:    98 F (36.7 C)  TempSrc:    Oral  SpO2: 100% 99% 100% 99%  Weight:      Height:       General exam: Appears calm and comfortable  Respiratory system: Clear to auscultation. Respiratory effort normal. Cardiovascular system: S1 & S2 heard, RRR. No JVD, murmurs, rubs, gallops or clicks. No pedal edema. Gastrointestinal system: Abdomen is distended, soft and nontender. No organomegaly or masses felt. Normal bowel sounds heard. Central nervous system: Alert and oriented. No focal neurological deficits. Extremities: Symmetric 5 x 5 power. Skin: No rashes, lesions or ulcers Psychiatry: Judgement and insight appear normal. Mood & affect appropriate.    Data Reviewed:  Lab results reviewed.  Family Communication: None  Disposition: Status is: Inpatient Remains inpatient appropriate because: Unsafe discharge pending nursing home placement.     Time spent: 35 minutes  Author: Marrion Coy, MD 12/16/2023 2:45 PM  For on call review [email or web address].

## 2023-12-17 DIAGNOSIS — K7460 Unspecified cirrhosis of liver: Secondary | ICD-10-CM | POA: Diagnosis not present

## 2023-12-17 DIAGNOSIS — K652 Spontaneous bacterial peritonitis: Secondary | ICD-10-CM | POA: Diagnosis not present

## 2023-12-17 DIAGNOSIS — N186 End stage renal disease: Secondary | ICD-10-CM | POA: Diagnosis not present

## 2023-12-17 DIAGNOSIS — R188 Other ascites: Secondary | ICD-10-CM | POA: Diagnosis not present

## 2023-12-17 LAB — GLUCOSE, CAPILLARY
Glucose-Capillary: 94 mg/dL (ref 70–99)
Glucose-Capillary: 171 mg/dL — ABNORMAL HIGH (ref 70–99)
Glucose-Capillary: 138 mg/dL — ABNORMAL HIGH (ref 70–99)
Glucose-Capillary: 177 mg/dL — ABNORMAL HIGH (ref 70–99)

## 2023-12-17 LAB — HEPATITIS B SURFACE ANTIBODY, QUANTITATIVE: Hep B S AB Quant (Post): 23.1 m[IU]/mL

## 2023-12-17 NOTE — Plan of Care (Signed)
  Problem: Clinical Measurements: Goal: Will remain free from infection Outcome: Progressing Goal: Respiratory complications will improve Outcome: Progressing Goal: Cardiovascular complication will be avoided Outcome: Progressing   Problem: Activity: Goal: Risk for activity intolerance will decrease Outcome: Progressing   Problem: Coping: Goal: Level of anxiety will decrease Outcome: Progressing   Problem: Elimination: Goal: Will not experience complications related to urinary retention Outcome: Progressing   Problem: Safety: Goal: Ability to remain free from injury will improve Outcome: Progressing   Problem: Skin Integrity: Goal: Risk for impaired skin integrity will decrease Outcome: Progressing

## 2023-12-17 NOTE — Progress Notes (Signed)
 [facility]  ROUNDING NOTE   Subjective:   Casey Franco is a 63 y.o. male with past medical history of HTN, ESRD, DM-2, and Hep C. Patient presents to emergency department with abdominal pain. He has been admitted for Intractable vomiting [R11.10] Abdominal pain [R10.9] Pneumonia of both lungs due to infectious organism, unspecified part of lung [J18.9] Sepsis without acute organ dysfunction, due to unspecified organism [facility]) [A41.9]  Patient is known to our practice and receives outpatient dialysis at [facility] on a MWF, last treatment on Tuesday.    Update Patient seen sitting up in bed Currently eating breakfast Denies pain or discomfort Reports bleeding from PermCath site yesterday.   Objective:  Vital signs in last 24 hours:  Temp:  [98 F (36.7 C)-99.1 F (37.3 C)] 98 F (36.7 C) (03/06 0713) Pulse Rate:  [78-81] 78 (03/06 0713) Resp:  [15-21] 16 (03/06 0713) BP: (109-132)/(74-86) 132/86 (03/06 0713) SpO2:  [97 %-100 %] 100 % (03/06 0713)  Weight change:  Filed Weights   12/14/23 1001 12/14/23 1320 12/16/23 0915  Weight: 60.6 kg 60.6 kg 61.9 kg    Intake/Output: No intake/output data recorded.   Intake/Output this shift:  No intake/output data recorded.  Physical Exam: General: NAD  Head: Normocephalic, atraumatic. Moist oral mucosal membranes  Eyes: Anicteric  Lungs:  Clear to auscultation, normal effort  Heart: Regular rate and rhythm, aflutter  Abdomen:  Soft, tender, mild distension  Extremities: No peripheral edema.  Neurologic: Alert and oriented, moving all four extremities  Skin: No lesions  Access: Right chest tunneled catheter exchanged on 12/15/23    Basic Metabolic Panel: Recent Labs  Lab 12/12/23 0544 12/13/23 0701 12/14/23 0418 12/15/23 0354 12/16/23 0414  NA 137 135 135 133* 133*  K 3.7 3.6 3.7 3.5 3.9  CL 104 101 100 98 100  CO2 24 24 22 24 23   GLUCOSE 88 82 94 90 126*  BUN 14 21 27* 18 28*  CREATININE  4.07* 5.79* 6.72* 4.48* 6.28*  CALCIUM 8.1* 8.1* 8.1* 7.9* 8.2*  PHOS  --   --   --   --  3.3    Liver Function Tests: No results for input(s): "AST", "ALT", "ALKPHOS", "BILITOT", "PROT", "ALBUMIN" in the last 168 hours.  No results for input(s): "LIPASE", "AMYLASE" in the last 168 hours.  No results for input(s): "AMMONIA" in the last 168 hours.   CBC: Recent Labs  Lab 12/12/23 0544 12/13/23 0701 12/14/23 0418 12/15/23 0354 12/16/23 0414  WBC 4.5 4.8 4.9 5.9 5.9  NEUTROABS 2.7 2.8 3.1 3.8 3.6  HGB 8.0* 8.4* 8.3* 8.2* 8.4*  HCT 24.6* 26.4* 25.0* 24.5* 26.4*  MCV 92.5 90.4 89.9 89.1 91.0  PLT 122* 129* 130* 139* 144*    Cardiac Enzymes: No results for input(s): "CKTOTAL", "CKMB", "CKMBINDEX", "TROPONINI" in the last 168 hours.  BNP: Invalid input(s): "POCBNP"  CBG: Recent Labs  Lab 12/15/23 2308 12/16/23 1516 12/16/23 1657 12/16/23 2122 12/17/23 0713  GLUCAP 122* 95 119* 119* 94    Microbiology: Results for orders placed or performed during the hospital encounter of 11/26/23  Culture, blood (Routine x 2)     Status: Abnormal   Collection Time: 11/26/23 11:22 AM   Specimen: BLOOD  Result Value Ref Range Status   Specimen Description   Final    BLOOD RIGHT ANTECUBITAL Performed at [facility], [address] [place], [place] [address]    Special Requests   Final    BOTTLES DRAWN  AEROBIC AND ANAEROBIC Blood Culture results may not be optimal due to an inadequate volume of blood received in culture bottles Performed at [facility], [address] [place], [place] [address]    Culture  Setup Time   Final    ANAEROBIC BOTTLE ONLY GRAM NEGATIVE RODS CRITICAL RESULT CALLED TO, READ BACK BY AND VERIFIED WITH: CAROLINE COULTER @ 11/28/23 2033 AB    Culture (A)  Final    BACTEROIDES THETAIOTAOMICRON BETA LACTAMASE POSITIVE Performed at [facility], [address], [place], [place] [address]    Report Status 12/01/2023 FINAL   Final  Blood Culture ID Panel (Reflexed)     Status: None   Collection Time: 11/26/23 11:22 AM  Result Value Ref Range Status   Enterococcus faecalis NOT DETECTED NOT DETECTED Final   Enterococcus Faecium NOT DETECTED NOT DETECTED Final   Listeria monocytogenes NOT DETECTED NOT DETECTED Final   Staphylococcus species NOT DETECTED NOT DETECTED Final   Staphylococcus aureus (BCID) NOT DETECTED NOT DETECTED Final   Staphylococcus epidermidis NOT DETECTED NOT DETECTED Final   Staphylococcus lugdunensis NOT DETECTED NOT DETECTED Final   Streptococcus species NOT DETECTED NOT DETECTED Final   Streptococcus agalactiae NOT DETECTED NOT DETECTED Final   Streptococcus pneumoniae NOT DETECTED NOT DETECTED Final   Streptococcus pyogenes NOT DETECTED NOT DETECTED Final   A.calcoaceticus-baumannii NOT DETECTED NOT DETECTED Final   Bacteroides fragilis NOT DETECTED NOT DETECTED Final   Enterobacterales NOT DETECTED NOT DETECTED Final   Enterobacter cloacae complex NOT DETECTED NOT DETECTED Final   Escherichia coli NOT DETECTED NOT DETECTED Final   Klebsiella aerogenes NOT DETECTED NOT DETECTED Final   Klebsiella oxytoca NOT DETECTED NOT DETECTED Final   Klebsiella pneumoniae NOT DETECTED NOT DETECTED Final   Proteus species NOT DETECTED NOT DETECTED Final   Salmonella species NOT DETECTED NOT DETECTED Final   Serratia marcescens NOT DETECTED NOT DETECTED Final   Haemophilus influenzae NOT DETECTED NOT DETECTED Final   Neisseria meningitidis NOT DETECTED NOT DETECTED Final   Pseudomonas aeruginosa NOT DETECTED NOT DETECTED Final   Stenotrophomonas maltophilia NOT DETECTED NOT DETECTED Final   Candida albicans NOT DETECTED NOT DETECTED Final   Candida auris NOT DETECTED NOT DETECTED Final   Candida glabrata NOT DETECTED NOT DETECTED Final   Candida krusei NOT DETECTED NOT DETECTED Final   Candida parapsilosis NOT DETECTED NOT DETECTED Final   Candida tropicalis NOT DETECTED NOT DETECTED Final    Cryptococcus neoformans/gattii NOT DETECTED NOT DETECTED Final    Comment: Performed at [facility], [address] [place], [place] [address]  Culture, blood (Routine x 2)     Status: Abnormal   Collection Time: 11/26/23 12:23 PM   Specimen: BLOOD LEFT ARM  Result Value Ref Range Status   Specimen Description   Final    BLOOD LEFT ARM Performed at [facility], [address] [place], [place] [address]    Special Requests   Final    BOTTLES DRAWN AEROBIC AND ANAEROBIC Blood Culture results may not be optimal due to an inadequate volume of blood received in culture bottles Performed at [facility], [address] [place], [place] [address]    Culture  Setup Time   Final    GRAM POSITIVE COCCI AEROBIC BOTTLE ONLY CRITICAL RESULT CALLED TO, READ BACK BY AND VERIFIED WITH: BRIANA ALLEY ON 11/27/23 AT 1107 QSD    Culture (A)  Final    STAPHYLOCOCCUS EPIDERMIDIS THE SIGNIFICANCE OF ISOLATING  THIS ORGANISM FROM A SINGLE SET OF BLOOD CULTURES WHEN MULTIPLE SETS ARE DRAWN IS UNCERTAIN. PLEASE NOTIFY THE MICROBIOLOGY DEPARTMENT WITHIN ONE WEEK IF SPECIATION AND SENSITIVITIES ARE REQUIRED. Performed at [facility], [address] [place], [place] [address]    Report Status 11/29/2023 FINAL  Final  Blood Culture ID Panel (Reflexed)     Status: Abnormal   Collection Time: 11/26/23 12:23 PM  Result Value Ref Range Status   Enterococcus faecalis NOT DETECTED NOT DETECTED Final   Enterococcus Faecium NOT DETECTED NOT DETECTED Final   Listeria monocytogenes NOT DETECTED NOT DETECTED Final   Staphylococcus species DETECTED (A) NOT DETECTED Final    Comment: CRITICAL RESULT CALLED TO, READ BACK BY AND VERIFIED WITH: BRIANA ALLEY ON 11/27/23 AT 1107 QSD    Staphylococcus aureus (BCID) NOT DETECTED NOT DETECTED Final   Staphylococcus epidermidis DETECTED (A) NOT DETECTED Final    Comment: CRITICAL RESULT CALLED TO, READ BACK BY AND VERIFIED WITH: BRIANA ALLEY ON  11/27/23 AT 1107 QSD    Staphylococcus lugdunensis NOT DETECTED NOT DETECTED Final   Streptococcus species NOT DETECTED NOT DETECTED Final   Streptococcus agalactiae NOT DETECTED NOT DETECTED Final   Streptococcus pneumoniae NOT DETECTED NOT DETECTED Final   Streptococcus pyogenes NOT DETECTED NOT DETECTED Final   A.calcoaceticus-baumannii NOT DETECTED NOT DETECTED Final   Bacteroides fragilis NOT DETECTED NOT DETECTED Final   Enterobacterales NOT DETECTED NOT DETECTED Final   Enterobacter cloacae complex NOT DETECTED NOT DETECTED Final   Escherichia coli NOT DETECTED NOT DETECTED Final   Klebsiella aerogenes NOT DETECTED NOT DETECTED Final   Klebsiella oxytoca NOT DETECTED NOT DETECTED Final   Klebsiella pneumoniae NOT DETECTED NOT DETECTED Final   Proteus species NOT DETECTED NOT DETECTED Final   Salmonella species NOT DETECTED NOT DETECTED Final   Serratia marcescens NOT DETECTED NOT DETECTED Final   Haemophilus influenzae NOT DETECTED NOT DETECTED Final   Neisseria meningitidis NOT DETECTED NOT DETECTED Final   Pseudomonas aeruginosa NOT DETECTED NOT DETECTED Final   Stenotrophomonas maltophilia NOT DETECTED NOT DETECTED Final   Candida albicans NOT DETECTED NOT DETECTED Final   Candida auris NOT DETECTED NOT DETECTED Final   Candida glabrata NOT DETECTED NOT DETECTED Final   Candida krusei NOT DETECTED NOT DETECTED Final   Candida parapsilosis NOT DETECTED NOT DETECTED Final   Candida tropicalis NOT DETECTED NOT DETECTED Final   Cryptococcus neoformans/gattii NOT DETECTED NOT DETECTED Final   Methicillin resistance mecA/C NOT DETECTED NOT DETECTED Final    Comment: Performed at [facility], [address] [place], [place] [address]  Body fluid culture w Gram Stain     Status: None   Collection Time: 11/26/23  8:55 PM   Specimen: PATH Cytology Peritoneal fluid  Result Value Ref Range Status   Specimen Description   Final    PERITONEAL CYTO Performed at [facility], [address], [place], [place] [address]    Special Requests   Final    NONE Performed at [facility], [address] [place], [place] [address]    Gram Stain   Final    FEW WBC PRESENT,BOTH PMN AND MONONUCLEAR FEW GRAM POSITIVE COCCI IN CHAINS MODERATE GRAM NEGATIVE RODS FEW GRAM POSITIVE RODS Performed at [facility], [address] [place], [place] [address]    Culture   Final    MODERATE STREPTOCOCCUS MITIS/ORALIS RARE CITROBACTER AMALONATICUS    Report Status 11/30/2023 FINAL  Final   Organism  ID, Bacteria STREPTOCOCCUS MITIS/ORALIS  Final   Organism ID, Bacteria CITROBACTER AMALONATICUS  Final      Susceptibility   Citrobacter amalonaticus - MIC*    CEFEPIME <=0.12 SENSITIVE Sensitive     CEFTAZIDIME <=1 SENSITIVE Sensitive     CEFTRIAXONE <=0.25 SENSITIVE Sensitive     CIPROFLOXACIN <=0.25 SENSITIVE Sensitive     GENTAMICIN <=1 SENSITIVE Sensitive     IMIPENEM <=0.25 SENSITIVE Sensitive     TRIMETH/SULFA <=20 SENSITIVE Sensitive     PIP/TAZO <=4 SENSITIVE Sensitive ug/mL    * RARE CITROBACTER AMALONATICUS   Streptococcus mitis/oralis - MIC*    PENICILLIN 0.25 INTERMEDIATE Intermediate     CEFTRIAXONE 0.25 SENSITIVE Sensitive     LEVOFLOXACIN 1 SENSITIVE Sensitive     VANCOMYCIN 0.5 SENSITIVE Sensitive     * MODERATE STREPTOCOCCUS MITIS/ORALIS  Culture, blood (Routine X 2) w Reflex to ID Panel     Status: None   Collection Time: 11/28/23  5:43 AM   Specimen: BLOOD LEFT ARM  Result Value Ref Range Status   Specimen Description BLOOD LEFT ARM  Final   Special Requests   Final    BOTTLES DRAWN AEROBIC AND ANAEROBIC Blood Culture results may not be optimal due to an inadequate volume of blood received in culture bottles   Culture   Final    NO GROWTH 5 DAYS Performed at [facility], [address] [place], [place] [address]    Report Status 12/03/2023 FINAL  Final  Culture, blood (Routine X 2) w Reflex to ID Panel      Status: None   Collection Time: 11/28/23  5:47 AM   Specimen: BLOOD RIGHT HAND  Result Value Ref Range Status   Specimen Description BLOOD RIGHT HAND  Final   Special Requests   Final    BOTTLES DRAWN AEROBIC AND ANAEROBIC Blood Culture results may not be optimal due to an inadequate volume of blood received in culture bottles   Culture   Final    NO GROWTH 5 DAYS Performed at [facility], [address], [place], [place] [address]    Report Status 12/03/2023 FINAL  Final  Body fluid culture w Gram Stain     Status: None   Collection Time: 12/01/23 10:10 AM   Specimen: Ascitic; Body Fluid  Result Value Ref Range Status   Specimen Description   Final    ASCITIC Performed at [facility], [address] [place], [place] [address]    Special Requests   Final    peri Performed at [facility], [address] [place], [place] [address]    Gram Stain   Final    FEW WBC PRESENT, PREDOMINANTLY PMN ABUNDANT GRAM NEGATIVE RODS ABUNDANT GRAM POSITIVE COCCI RARE GRAM POSITIVE RODS    Culture   Final    FEW STREPTOCOCCUS MITIS/ORALIS RARE CITROBACTER AMALONATICUS MODERATE BACTEROIDES THETAIOTAOMICRON BETA LACTAMASE POSITIVE RARE EIKENELLA CORRODENS Usually susceptible to penicillin and other beta lactam agents,quinolones,macrolides and tetracyclines. Performed at [facility], [address] [place], [place] [address]    Report Status 12/06/2023 FINAL  Final   Organism ID, Bacteria STREPTOCOCCUS MITIS/ORALIS  Final   Organism ID, Bacteria CITROBACTER AMALONATICUS  Final      Susceptibility   Citrobacter amalonaticus - MIC*    CEFEPIME <=0.12 SENSITIVE Sensitive     CEFTAZIDIME <=1 SENSITIVE Sensitive     CEFTRIAXONE <=0.25 SENSITIVE Sensitive     CIPROFLOXACIN <=0.25 SENSITIVE Sensitive  GENTAMICIN <=1 SENSITIVE Sensitive     IMIPENEM <=0.25 SENSITIVE Sensitive     TRIMETH/SULFA <=20 SENSITIVE Sensitive     PIP/TAZO <=4 SENSITIVE  Sensitive ug/mL    * RARE CITROBACTER AMALONATICUS   Streptococcus mitis/oralis - MIC*    PENICILLIN <=0.06 SENSITIVE Sensitive     CEFTRIAXONE 0.25 SENSITIVE Sensitive     LEVOFLOXACIN 1 SENSITIVE Sensitive     VANCOMYCIN 0.5 SENSITIVE Sensitive     * FEW STREPTOCOCCUS MITIS/ORALIS    Coagulation Studies: No results for input(s): "LABPROT", "INR" in the last 72 hours.   Urinalysis: No results for input(s): "COLORURINE", "LABSPEC", "PHURINE", "GLUCOSEU", "HGBUR", "BILIRUBINUR", "KETONESUR", "PROTEINUR", "UROBILINOGEN", "NITRITE", "LEUKOCYTESUR" in the last 72 hours.  Invalid input(s): "APPERANCEUR"    Imaging: PERIPHERAL VASCULAR CATHETERIZATION Result Date: 12/15/2023 See surgical note for result.       Medications:       amLODipine  10 mg Oral Daily   apixaban  2.5 mg Oral BID   Chlorhexidine Gluconate Cloth  6 each Topical Q0600   ciprofloxacin  250 mg Oral QHS   epoetin alfa-epbx (RETACRIT) injection  10,000 Units Intravenous Q M,W,F-HD   feeding supplement  237 mL Oral TID BM   insulin aspart  0-6 Units Subcutaneous TID WC   irbesartan  300 mg Oral Daily   lactulose  20 g Oral BID   metoprolol tartrate  25 mg Oral BID   multivitamin  1 tablet Oral QHS   pantoprazole  40 mg Oral Daily   tamsulosin  0.4 mg Oral Daily   acetaminophen, alum & mag hydroxide-simeth, antiseptic oral rinse, hydrALAZINE, hydrOXYzine, ondansetron (ZOFRAN) IV  Assessment/ Plan:  Mr. Casey Franco is a 63 y.o.  male with HTN, ESRD, DM-2, Hep C.  Patient presents with chest pain and has been admitted for Intractable vomiting [R11.10] Abdominal pain [R10.9] Pneumonia of both lungs due to infectious organism, unspecified part of lung [J18.9] Sepsis without acute organ dysfunction, due to unspecified organism [facility]) [A41.9]  [facility]/MWF/right chest PermCath/71.0 kg  End-stage renal disease on hemodialysis.  Dialysis received yesterday with no UF due to poor oral  intake.  Appetite is improving.  Next treatment scheduled for Friday.   2. Anemia of chronic kidney disease Lab Results  Component Value Date   HGB 8.4 (L) 12/16/2023    Continue Epogen 10,000 units IV with dialysis treatments.  3. Secondary Hyperparathyroidism: with outpatient labs: PTH 351, phosphorus 6.6, calcium 8.1 on 11/23/2023.   Lab Results  Component Value Date   PTH 31 08/30/2018   CALCIUM 8.2 (L) 12/16/2023   PHOS 3.3 12/16/2023    Calcium and phosphorus stable.  Will continue to monitor.  4.  Hypertension with chronic kidney disease.  Home regimen includes furosemide, hydralazine, amlodipine, and irbesartan. Amlodipine, Avapro, and metoprolol currently prescribed.  Blood pressure acceptable, 132/86.  5.  Cirrhosis/ascites, spontaneous bacterial peritonitis. Paracentesis from 11/27/2023 yielded 1.6 L of fluid.  Complex loculations suspected as more ascites present. Microbiology results show Staph epidermidis in the blood. Peritoneal fluid culture from 11/26/2023 shows Streptococcus and Citrobacter. Lactulose twice a day. Paracentesis on 12/02/23 with 4.6L removed. Fluid cultures negative  Patient remains on cipro.     LOS: 21 Lorra Freeman 3/6/202511:05 AM

## 2023-12-17 NOTE — TOC Progression Note (Addendum)
 Transition of Care [facility]) - Progression Note    Patient Details  Name: Casey Franco MRN: [id number] Date of Birth: 10/07/1961  Transition of Care [facility]) CM/SW Contact  Garret Reddish, RN Phone Number: 12/17/2023, 4:28 PM  Clinical Narrative:    Receive information from CMA that Peer to Peer had been denied. I have informed Casey Franco that SNF referral has been denied.  He has asked that I give the appeal information to his sister.  I left message for [name] with appeal information.  Appeal number is [phone].  TOC will continue to follow.         Expected Discharge Plan and Services         Expected Discharge Date: 12/15/23                                     Social Determinants of Health (SDOH) Interventions SDOH Screenings   Food Insecurity: Patient Declined (11/28/2023)  Housing: Low Risk  (12/06/2023)  Transportation Needs: Patient Declined (11/28/2023)  Utilities: Not At Risk (11/28/2023)  Financial Resource Strain: Low Risk  (05/15/2020)   Received from [facility], [facility]  Tobacco Use: Medium Risk (11/26/2023)    Readmission Risk Interventions     No data to display

## 2023-12-17 NOTE — TOC Progression Note (Signed)
 Transition of Care [facility]) - Progression Note    Patient Details  Name: Casey Franco MRN: [id number] Date of Birth: Nov 30, 1960  [facility] CM/SW Contact  Chapman Fitch, RN Phone Number: 12/17/2023, 8:49 AM  Clinical Narrative:    [facility] pending for SNF        Expected Discharge Plan and Services         Expected Discharge Date: 12/15/23                                     Social Determinants of Health (SDOH) Interventions SDOH Screenings   Food Insecurity: Patient Declined (11/28/2023)  Housing: Low Risk  (12/06/2023)  Transportation Needs: Patient Declined (11/28/2023)  Utilities: Not At Risk (11/28/2023)  Financial Resource Strain: Low Risk  (05/15/2020)   Received from [facility], [facility]  Tobacco Use: Medium Risk (11/26/2023)    Readmission Risk Interventions     No data to display

## 2023-12-17 NOTE — Progress Notes (Signed)
 Mobility Specialist - Progress Note   12/17/23 1026  Mobility  Activity Ambulated with assistance in hallway  Level of Assistance Standby assist, set-up cues, supervision of patient - no hands on  Assistive Device Front wheel walker  Distance Ambulated (ft) 480 ft  Activity Response Tolerated well  Mobility visit 1 Mobility     Pt sitting edge of bed upon arrival, utilizing RA. Pt motivated for activity. STS and ambulation in hallway with supervision. No LOB. Completed 3 laps around nursing unit. Mildly fatigued post-ambulation. MinA on LE to return supine. Pt returned to bed with alarm set, needs in reach.    Filiberto Pinks Mobility Specialist 12/17/23, 10:27 AM

## 2023-12-17 NOTE — Plan of Care (Signed)
  Problem: Clinical Measurements: Goal: Ability to maintain clinical measurements within normal limits will improve Outcome: Progressing   Problem: Nutrition: Goal: Adequate nutrition will be maintained Outcome: Progressing   Problem: Coping: Goal: Level of anxiety will decrease Outcome: Progressing   Problem: Elimination: Goal: Will not experience complications related to bowel motility Outcome: Progressing Goal: Will not experience complications related to urinary retention Outcome: Progressing   Problem: Pain Managment: Goal: General experience of comfort will improve and/or be controlled Outcome: Progressing   Problem: Safety: Goal: Ability to remain free from injury will improve Outcome: Progressing   Problem: Skin Integrity: Goal: Risk for impaired skin integrity will decrease Outcome: Progressing

## 2023-12-17 NOTE — TOC Progression Note (Signed)
 Transition of Care [facility]) - Progression Note    Patient Details  Name: Casey Franco MRN: [id number] Date of Birth: 1961/07/29  [facility]) CM/SW Contact  Chapman Fitch, RN Phone Number: 12/17/2023, 1:33 PM  Clinical Narrative:        Per MD he was told during the Peer to Peer that auth would be denied . Still pending in navi portal.  Met with patient at bedside to discuss plan.  He states that he lives at home alone.  He is to discuss with his sister what the plan will be for discharge.  I attempted to call sister Bobbi.  Unable to leave a voicemail.  Text message sent requesting a return call    Expected Discharge Plan and Services         Expected Discharge Date: 12/15/23                                     Social Determinants of Health (SDOH) Interventions SDOH Screenings   Food Insecurity: Patient Declined (11/28/2023)  Housing: Low Risk  (12/06/2023)  Transportation Needs: Patient Declined (11/28/2023)  Utilities: Not At Risk (11/28/2023)  Financial Resource Strain: Low Risk  (05/15/2020)   Received from [facility], [facility]  Tobacco Use: Medium Risk (11/26/2023)    Readmission Risk Interventions     No data to display

## 2023-12-17 NOTE — Progress Notes (Addendum)
 PT Cancellation Note  Patient Details Name: Casey Franco MRN: [id number] DOB: [birth year]   Cancelled Treatment:     PT attempt. Pt pretty upset about insurance denial for rehab." The reason I was denied is because they said I walk too much. Im not getting up and moving now! I have to go to rehab, I have no where else to go." Discussed importances of routine mobility and continuing to mobilize to improve strength and abilities.    Rushie Chestnut 12/17/2023, 2:57 PM

## 2023-12-17 NOTE — TOC Progression Note (Signed)
 Transition of Care [facility]) - Progression Note    Patient Details  Name: Casey Franco MRN: [id number] Date of Birth: [birth year]  [facility]) CM/SW Contact  Chapman Fitch, RN Phone Number: 12/17/2023, 11:00 AM  Clinical Narrative:        Peer2Peer requested please call [phone] option 5. deadline is today 3.6.25 @ 2:30pm EST. please provide pts name, DOB 12/31/1960, Plan ID [id number]   MD aware    Expected Discharge Plan and Services         Expected Discharge Date: 12/15/23                                     Social Determinants of Health (SDOH) Interventions SDOH Screenings   Food Insecurity: Patient Declined (11/28/2023)  Housing: Low Risk  (12/06/2023)  Transportation Needs: Patient Declined (11/28/2023)  Utilities: Not At Risk (11/28/2023)  Financial Resource Strain: Low Risk  (05/15/2020)   Received from [facility], [facility]  Tobacco Use: Medium Risk (11/26/2023)    Readmission Risk Interventions     No data to display

## 2023-12-17 NOTE — Plan of Care (Signed)

## 2023-12-17 NOTE — Progress Notes (Signed)
 Progress Note   Patient: Casey Franco ZOX:[id number] DOB: April 21, 1961 DOA: 11/26/2023     21 DOS: the patient was seen and examined on 12/17/2023   Brief hospital course: Casey Franco is a 63 y.o. 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  He also complained of nausea, increasing abdominal distention and multiple episodes of vomiting.  Patient is diagnosed with spontaneous bacterial peritonitis, he has completed 2 weeks of antibiotics, currently on prophylactic Cipro. Patient also had a paracentesis on 2/18 with removal of 4.6 L of fluids. Patient condition has improved, currently pending nursing home placement.   Principal Problem:   Spontaneous bacterial peritonitis (HCC) Active Problems:   Abdominal pain   Cirrhosis of liver with ascites (HCC)   Myocardial injury   Essential hypertension   ESRD (end stage renal disease) (HCC)   Coffee ground emesis   Paroxysmal atrial fibrillation (HCC)   Sepsis without acute organ dysfunction (HCC)   Thrombocytopenia (HCC)   Pressure injury of skin   Assessment and Plan: Spontaneous bacterial peritonitis Abdominal pain Liver cirrhosis with ascites: S/p paracentesis with removal of 1600 mL of fluid on 11/26/2023.   Fluid positive for SBP (ascitic fluid total nucleated cells 1,326, percentage neutrophils was 68% and absolute PMN count 901) Ascitic fluid culture showed Streptococcus mitis and Citrobacter amalonaticus S/p repeat paracentesis on 12/01/2023 with removal of 4.6 L of fluid. Percentage neutrophil count was 100 and total nucleated cell count 4,687. He was evaluated by the general surgeon.  No indication for surgery at this time.   Has completed 2 weeks of antibiotics for SBP Continue on SBP prophylaxis with ciprofloxacin Patient current condition  stable, abdominal distention at baseline, no need for repeat paracentesis.     Bacteroides  thetaiotaomicron bacteremia (blood cultures from 11/26/2023 growing gram-negative rods).   Has completed 2 weeks of metronidazole as recommended by infectious disease on 12/14/2018 Blood culture also positive for staph epi which he appeared to be a contaminant.     Hematemesis: Resolved.  This is probably from multiple episodes of vomiting.   H&H is stable.     Elevated troponins: Troponin is 172, 138, 127 and 128.  No chest pain or acute EKG changes. Due to demand ischemia.     Paroxysmal atrial fibrillation, PVCs: Heart rate is better.  Continue metoprolol.  We discussed risks, benefits and alternatives to long-term anticoagulation for atrial fibrillation. He is at increased risk for bleeding from long-term anticoagulation because of underlying severe liver and kidney disease. CHA2DS2-VASc score is 3 and he is also at increased risk for stroke. Patient has agreed to Eliquis Eliquis initiated as hemoglobin is stabilized   Chronic systolic CHF Moderate mitral regurgitation 2D echo showed EF estimated at 40 to 45%, moderate LVH, indeterminate left ventricular diastolic parameters, mildly reduced RV systolic function, moderately enlarged RV size, moderately dilated right atrium, small pericardial effusion, moderate mitral regurgitation --Volume management by dialysis   Confusion on 11/30/2023, likely acute toxic encephalopathy from oxycodone: Oxycodone has been discontinued. Continue delirium precaution     ESRD on hemodialysis MWF Hyponatremia. --Nephrology following for hemodialysis    Dysphagia: Speech therapist recommended dysphagia 2 diet, thin liquids.     General weakness: PT recommended discharge to SNF.   Follow-up with TOC to assist with placement.     Essential hypertension Continue current antihypertensives  type II DM   Glucose not significant elevated.   Pressure  ulcer: POA Pressure Injury 12/02/23 Sacrum Mid Stage 1 -  Intact skin with non-blanchable redness of a localized area usually over a bony prominence. (Active)  12/02/23 1500  Location: Sacrum  Location Orientation: Mid  Staging: Stage 1 -  Intact skin with non-blanchable redness of a localized area usually over a bony prominence.  Wound Description (Comments):   Present on Admission: No          Subjective:  Has no complaint today.  Physical Exam: Vitals:   12/16/23 1912 12/16/23 2140 12/17/23 0305 12/17/23 0713  BP: 120/83 120/83 109/77 132/86  Pulse: 81 81 78 78  Resp: 20  20 16   Temp: 98.9 F (37.2 C)  98.4 F (36.9 C) 98 F (36.7 C)  TempSrc:      SpO2: 100%  97% 100%  Weight:      Height:       General exam: Appears calm and comfortable  Respiratory system: Clear to auscultation. Respiratory effort normal. Cardiovascular system: S1 & S2 heard, RRR. No JVD, murmurs, rubs, gallops or clicks. No pedal edema. Gastrointestinal system: Abdomen is distended, soft and nontender. No organomegaly or masses felt. Normal bowel sounds heard. Central nervous system: Alert and oriented. No focal neurological deficits. Extremities: Symmetric 5 x 5 power. Skin: No rashes, lesions or ulcers Psychiatry: Judgement and insight appear normal. Mood & affect appropriate.    Data Reviewed:  There are no new results to review at this time.  Family Communication: None  Disposition: Status is: Inpatient Remains inpatient appropriate because: Unsafe discharge, pending placement.     Time spent: 25 minutes  Author: Marrion Coy, MD 12/17/2023 2:47 PM  For on call review [email or web address].

## 2023-12-18 DIAGNOSIS — K652 Spontaneous bacterial peritonitis: Secondary | ICD-10-CM | POA: Diagnosis not present

## 2023-12-18 DIAGNOSIS — N186 End stage renal disease: Secondary | ICD-10-CM | POA: Diagnosis not present

## 2023-12-18 DIAGNOSIS — R188 Other ascites: Secondary | ICD-10-CM | POA: Diagnosis not present

## 2023-12-18 DIAGNOSIS — K7460 Unspecified cirrhosis of liver: Secondary | ICD-10-CM | POA: Diagnosis not present

## 2023-12-18 LAB — RENAL FUNCTION PANEL
Albumin: 2 g/dL — ABNORMAL LOW (ref 3.5–5.0)
Anion gap: 11 (ref 5–15)
BUN: 26 mg/dL — ABNORMAL HIGH (ref 8–23)
CO2: 26 mmol/L (ref 22–32)
Calcium: 8.1 mg/dL — ABNORMAL LOW (ref 8.9–10.3)
Chloride: 96 mmol/L — ABNORMAL LOW (ref 98–111)
Creatinine, Ser: 5.9 mg/dL — ABNORMAL HIGH (ref 0.61–1.24)
GFR, Estimated: 10 mL/min — ABNORMAL LOW (ref >60)
Glucose, Bld: 122 mg/dL — ABNORMAL HIGH (ref 70–99)
Phosphorus: 2.9 mg/dL (ref 2.5–4.6)
Potassium: 3.5 mmol/L (ref 3.5–5.1)
Sodium: 133 mmol/L — ABNORMAL LOW (ref 135–145)

## 2023-12-18 LAB — GLUCOSE, CAPILLARY
Glucose-Capillary: 94 mg/dL (ref 70–99)
Glucose-Capillary: 145 mg/dL — ABNORMAL HIGH (ref 70–99)
Glucose-Capillary: 153 mg/dL — ABNORMAL HIGH (ref 70–99)

## 2023-12-18 LAB — CBC
HCT: 24 % — ABNORMAL LOW (ref 39.0–52.0)
Hemoglobin: 7.7 g/dL — ABNORMAL LOW (ref 13.0–17.0)
MCH: 29.6 pg (ref 26.0–34.0)
MCHC: 32.1 g/dL (ref 30.0–36.0)
MCV: 92.3 fL (ref 80.0–100.0)
Platelets: 145 10*3/uL — ABNORMAL LOW (ref 150–400)
RBC: 2.6 MIL/uL — ABNORMAL LOW (ref 4.22–5.81)
RDW: 16.5 % — ABNORMAL HIGH (ref 11.5–15.5)
WBC: 7.2 10*3/uL (ref 4.0–10.5)
nRBC: 0 % (ref 0.0–0.2)

## 2023-12-18 MED ORDER — EPOETIN ALFA-EPBX 10000 UNIT/ML IJ SOLN
INTRAMUSCULAR | Status: AC
  Filled 2023-12-18: qty 1

## 2023-12-18 NOTE — Progress Notes (Signed)
  Received patient in bed to unit.   Informed consent signed and in chart.    TX duration:3.5 hours      Transported back to floor Hand-off given to patient's primary nurse.    Access used: R HD Cath Access issues: no   Total UF removed: 0 Medication(s) given: retacrit 10.000 units  Post HD VS: WNL Post HD weight: 60.8kg     Reana Chacko,LPN [facility]

## 2023-12-18 NOTE — Progress Notes (Signed)
 Nutrition Follow-up  DOCUMENTATION CODES:   Underweight  INTERVENTION:   Ensure Enlive po TID, each supplement provides 350 kcal and 20 grams of protein.  Rena-vit po daily   NUTRITION DIAGNOSIS:   Increased nutrient needs related to chronic illness as evidenced by estimated needs.  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  Pt unavailable at time of RD visit. Per chart, pt eating 25-100% of meals and is drinking the Ensure supplements. Refeed labs stable. Per chart, pt is down ~15lbs since admission but appears weight stable for the past 2 weeks. Recommend continue supplements. Pt currently awaiting placement. RD will attempt to obtain NFPE at follow up.   Medications reviewed and include: ciprofloxacin, epoetin, insulin, lactulose, rena-vit, protonix   Labs reviewed: Na 133(L), K 3.5 wnl, BUN 26(H), creat 5.90(H), P 2.9 wnl Hgb 7.7(L), Hct 24.0(L)  NUTRITION - FOCUSED PHYSICAL EXAM: Unable to obtain at this time   Diet Order:   Diet Order             Diet renal with fluid restriction Fluid restriction: 1200 mL Fluid; Room service appropriate? Yes; Fluid consistency: Thin  Diet effective now                  EDUCATION NEEDS:   No education needs have been identified at this time  Skin:  Skin Assessment: Skin Integrity Issues: Skin Integrity Issues:: Stage I Stage I: Mid Sacrum  Last BM:  3/6  Height:   Ht Readings from Last 1 Encounters:  11/26/23 6' (1.829 m)    Weight:   Wt Readings from Last 1 Encounters:  12/18/23 60.8 kg    Ideal Body Weight:  80.91 kg  BMI:  Body mass index is 18.18 kg/m.  Estimated Nutritional  Needs:   Kcal:  1900-2200kcal/day  Protein:  95-110g/day  Fluid:  1L + UOP  Betsey Holiday MS, RD, LDN If unable to be reached, please send secure chat to "RD inpatient" available from 8:00a-4:00p daily

## 2023-12-18 NOTE — TOC Progression Note (Signed)
 Transition of Care [facility]) - Progression Note    Patient Details  Name: Casey Franco MRN: [id number] Date of Birth: July 22, 1961  [facility] CM/SW Contact  Liliana Cline, LCSW Phone Number: 12/18/2023, 1:53 PM  Clinical Narrative:    CSW called patient's sister Yvonne Kendall. Inquired if appeal has been started. Explained patient is medically ready for DC so if they plan to appeal it needs to be started today. Yvonne Kendall states she will call this afternoon to start the appeal.        Expected Discharge Plan and Services         Expected Discharge Date: 12/15/23                                     Social Determinants of Health (SDOH) Interventions SDOH Screenings   Food Insecurity: Patient Declined (11/28/2023)  Housing: Low Risk  (12/06/2023)  Transportation Needs: Patient Declined (11/28/2023)  Utilities: Not At Risk (11/28/2023)  Financial Resource Strain: Low Risk  (05/15/2020)   Received from [facility], [facility]  Tobacco Use: Medium Risk (11/26/2023)    Readmission Risk Interventions     No data to display

## 2023-12-18 NOTE — Progress Notes (Signed)
 Progress Note   Patient: Casey Franco AOZ:[id number] DOB: 02-Apr-1961 DOA: 11/26/2023     22 DOS: the patient was seen and examined on 12/18/2023   Brief hospital course: JABAR KRYSIAK is a 63 y.o. 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  He also complained of nausea, increasing abdominal distention and multiple episodes of vomiting.  Patient is diagnosed with spontaneous bacterial peritonitis, he has completed 2 weeks of antibiotics, currently on prophylactic Cipro. Patient also had a paracentesis on 2/18 with removal of 4.6 L of fluids. Patient condition has improved, patient parents has denied the patient nursing home placement, patient decided to appeal for the discharge.   Principal Problem:   Spontaneous bacterial peritonitis (HCC) Active Problems:   Abdominal pain   Cirrhosis of liver with ascites (HCC)   Myocardial injury   Essential hypertension   ESRD (end stage renal disease) (HCC)   Coffee ground emesis   Paroxysmal atrial fibrillation (HCC)   Sepsis without acute organ dysfunction (HCC)   Thrombocytopenia (HCC)   Pressure injury of skin   Assessment and Plan: Spontaneous bacterial peritonitis Abdominal pain Liver cirrhosis with ascites: S/p paracentesis with removal of 1600 mL of fluid on 11/26/2023.   Fluid positive for SBP (ascitic fluid total nucleated cells 1,326, percentage neutrophils was 68% and absolute PMN count 901) Ascitic fluid culture showed Streptococcus mitis and Citrobacter amalonaticus S/p repeat paracentesis on 12/01/2023 with removal of 4.6 L of fluid. Percentage neutrophil count was 100 and total nucleated cell count 4,687. He was evaluated by the general surgeon.  No indication for surgery at this time.   Has completed 2 weeks of antibiotics for  SBP Continue on SBP prophylaxis with ciprofloxacin Patient current condition stable, abdominal distention at baseline, no need for repeat paracentesis.     Bacteroides  thetaiotaomicron bacteremia (blood cultures from 11/26/2023 growing gram-negative rods).   Has completed 2 weeks of metronidazole as recommended by infectious disease on 12/14/2023 Blood culture also positive for staph epi which he appeared to be a contaminant.     Hematemesis: Resolved.  This is probably from multiple episodes of vomiting.   H&H is stable.     Elevated troponins: Troponin is 172, 138, 127 and 128.  No chest pain or acute EKG changes. Due to demand ischemia.     Paroxysmal atrial fibrillation, PVCs: Heart rate is better.  Continue metoprolol.  We discussed risks, benefits and alternatives to long-term anticoagulation for atrial fibrillation. He is at increased risk for bleeding from long-term anticoagulation because of underlying severe liver and kidney disease. CHA2DS2-VASc score is 3 and he is also at increased risk for stroke. Patient has agreed to Eliquis Eliquis initiated as hemoglobin is stabilized   Chronic systolic CHF Moderate mitral regurgitation 2D echo showed EF estimated at 40 to 45%, moderate LVH, indeterminate left ventricular diastolic parameters, mildly reduced RV systolic function, moderately enlarged RV size, moderately dilated right atrium, small pericardial effusion, moderate mitral regurgitation --Volume management by dialysis   Confusion on 11/30/2023, likely acute toxic encephalopathy from oxycodone: Oxycodone has been discontinued. Continue delirium precaution     ESRD on hemodialysis MWF Hyponatremia. --Nephrology following for hemodialysis    Dysphagia: Speech therapist recommended dysphagia 2 diet, thin liquids.     General weakness: PT recommended discharge to SNF.   Follow-up with TOC to assist with  placement.     Essential hypertension Continue current  antihypertensives   type II DM   Glucose not significant elevated.   Pressure ulcer: POA Pressure Injury 12/02/23 Sacrum Mid Stage 1 -  Intact skin with non-blanchable redness of a localized area usually over a bony prominence. (Active)  12/02/23 1500  Location: Sacrum  Location Orientation: Mid  Staging: Stage 1 -  Intact skin with non-blanchable redness of a localized area usually over a bony prominence.  Wound Description (Comments):   Present on Admission: No    Condition has been stabilized, patient is appealing the denial by insurance company for nursing home placement.  Will keep patient until appeal process complete.     Subjective:  Well today, able to walk in the hallway, had a normal bowel movement.  Physical Exam: Vitals:   12/18/23 1130 12/18/23 1157 12/18/23 1218 12/18/23 1252  BP: (!) 145/82 (!) 142/82  120/80  Pulse: 84 80  80  Resp: 13 10    Temp:  98.2 F (36.8 C)  97.9 F (36.6 C)  TempSrc:  Oral  Oral  SpO2: 100% 100%  99%  Weight:   60.8 kg   Height:       General exam: Appears calm and comfortable  Respiratory system: Clear to auscultation. Respiratory effort normal. Cardiovascular system: S1 & S2 heard, RRR. No JVD, murmurs, rubs, gallops or clicks. No pedal edema. Gastrointestinal system: Abdomen is distended, soft and nontender. No organomegaly or masses felt. Normal bowel sounds heard. Central nervous system: Alert and oriented. No focal neurological deficits. Extremities: Symmetric 5 x 5 power. Skin: No rashes, lesions or ulcers Psychiatry: Judgement and insight appear normal. Mood & affect appropriate.    Data Reviewed:  There are no new results to review at this time.  Family Communication: None  Disposition: Status is: Inpatient Remains inpatient appropriate because: Unsafe discharge option.     Time spent: 25 minutes  Author: Marrion Coy, MD 12/18/2023 2:43 PM  For on call review [email or web address].

## 2023-12-18 NOTE — Progress Notes (Signed)
 [facility]  ROUNDING NOTE   Subjective:   Casey Franco is a 63 y.o. male with past medical history of HTN, ESRD, DM-2, and Hep C. Patient presents to emergency department with abdominal pain. He has been admitted for Intractable vomiting [R11.10] Abdominal pain [R10.9] Pneumonia of both lungs due to infectious organism, unspecified part of lung [J18.9] Sepsis without acute organ dysfunction, due to unspecified organism [facility]) [A41.9]  Patient is known to our practice and receives outpatient dialysis at [facility] on a MWF, last treatment on Tuesday.    Update Patient seen and evaluated during dialysis   HEMODIALYSIS FLOWSHEET:  Blood Flow Rate (mL/min): 400 mL/min Arterial Pressure (mmHg): -175.55 mmHg Venous Pressure (mmHg): 131.3 mmHg TMP (mmHg): -1.61 mmHg Ultrafiltration Rate (mL/min): 257 mL/min Dialysate Flow Rate (mL/min): 300 ml/min  Tolerating treatment well   Objective:  Vital signs in last 24 hours:  Temp:  [98.2 F (36.8 C)-98.9 F (37.2 C)] 98.2 F (36.8 C) (03/07 1157) Pulse Rate:  [57-84] 80 (03/07 1157) Resp:  [10-20] 10 (03/07 1157) BP: (112-145)/(74-85) 142/82 (03/07 1157) SpO2:  [96 %-100 %] 100 % (03/07 1157) Weight:  [60.8 kg] 60.8 kg (03/07 1218)  Weight change:  Filed Weights   12/16/23 0915 12/18/23 0756 12/18/23 1218  Weight: 61.9 kg 60.8 kg 60.8 kg    Intake/Output: I/O last 3 completed shifts: In: 480 [P.O.:480] Out: 0    Intake/Output this shift:  No intake/output data recorded.  Physical Exam: General: NAD  Head: Normocephalic, atraumatic. Moist oral mucosal membranes  Eyes: Anicteric  Lungs:  Clear to auscultation, normal effort  Heart: Regular rate and rhythm, aflutter  Abdomen:  Soft, tender, mild distension  Extremities: No peripheral edema.  Neurologic: Alert and oriented, moving all four extremities  Skin: No lesions  Access: Right chest tunneled catheter exchanged on 12/15/23    Basic  Metabolic Panel: Recent Labs  Lab 12/13/23 0701 12/14/23 0418 12/15/23 0354 12/16/23 0414 12/18/23 0813  NA 135 135 133* 133* 133*  K 3.6 3.7 3.5 3.9 3.5  CL 101 100 98 100 96*  CO2 24 22 24 23 26   GLUCOSE 82 94 90 126* 122*  BUN 21 27* 18 28* 26*  CREATININE 5.79* 6.72* 4.48* 6.28* 5.90*  CALCIUM 8.1* 8.1* 7.9* 8.2* 8.1*  PHOS  --   --   --  3.3 2.9    Liver Function Tests: Recent Labs  Lab 12/18/23 0813  ALBUMIN 2.0*    No results for input(s): "LIPASE", "AMYLASE" in the last 168 hours.  No results for input(s): "AMMONIA" in the last 168 hours.   CBC: Recent Labs  Lab 12/12/23 0544 12/13/23 0701 12/14/23 0418 12/15/23 0354 12/16/23 0414 12/18/23 0813  WBC 4.5 4.8 4.9 5.9 5.9 7.2  NEUTROABS 2.7 2.8 3.1 3.8 3.6  --   HGB 8.0* 8.4* 8.3* 8.2* 8.4* 7.7*  HCT 24.6* 26.4* 25.0* 24.5* 26.4* 24.0*  MCV 92.5 90.4 89.9 89.1 91.0 92.3  PLT 122* 129* 130* 139* 144* 145*    Cardiac Enzymes: No results for input(s): "CKTOTAL", "CKMB", "CKMBINDEX", "TROPONINI" in the last 168 hours.  BNP: Invalid input(s): "POCBNP"  CBG: Recent Labs  Lab 12/16/23 2122 12/17/23 0713 12/17/23 1121 12/17/23 1743 12/17/23 2117  GLUCAP 119* 94 171* 138* 177*    Microbiology: Results for orders placed or performed during the hospital encounter of 11/26/23  Culture, blood (Routine x 2)     Status: Abnormal   Collection Time: 11/26/23 11:22 AM  Specimen: BLOOD  Result Value Ref Range Status   Specimen Description   Final    BLOOD RIGHT ANTECUBITAL Performed at [facility], [address] [place], [place] [address]    Special Requests   Final    BOTTLES DRAWN AEROBIC AND ANAEROBIC Blood Culture results may not be optimal due to an inadequate volume of blood received in culture bottles Performed at [facility], [address] [place], [place] [address]    Culture  Setup Time   Final    ANAEROBIC BOTTLE ONLY GRAM NEGATIVE RODS CRITICAL RESULT CALLED  TO, READ BACK BY AND VERIFIED WITH: CAROLINE COULTER @ 11/28/23 2033 AB    Culture (A)  Final    BACTEROIDES THETAIOTAOMICRON BETA LACTAMASE POSITIVE Performed at [facility], [address] [place], [place] [address]    Report Status 12/01/2023 FINAL  Final  Blood Culture ID Panel (Reflexed)     Status: None   Collection Time: 11/26/23 11:22 AM  Result Value Ref Range Status   Enterococcus faecalis NOT DETECTED NOT DETECTED Final   Enterococcus Faecium NOT DETECTED NOT DETECTED Final   Listeria monocytogenes NOT DETECTED NOT DETECTED Final   Staphylococcus species NOT DETECTED NOT DETECTED Final   Staphylococcus aureus (BCID) NOT DETECTED NOT DETECTED Final   Staphylococcus epidermidis NOT DETECTED NOT DETECTED Final   Staphylococcus lugdunensis NOT DETECTED NOT DETECTED Final   Streptococcus species NOT DETECTED NOT DETECTED Final   Streptococcus agalactiae NOT DETECTED NOT DETECTED Final   Streptococcus pneumoniae NOT DETECTED NOT DETECTED Final   Streptococcus pyogenes NOT DETECTED NOT DETECTED Final   A.calcoaceticus-baumannii NOT DETECTED NOT DETECTED Final   Bacteroides fragilis NOT DETECTED NOT DETECTED Final   Enterobacterales NOT DETECTED NOT DETECTED Final   Enterobacter cloacae complex NOT DETECTED NOT DETECTED Final   Escherichia coli NOT DETECTED NOT DETECTED Final   Klebsiella aerogenes NOT DETECTED NOT DETECTED Final   Klebsiella oxytoca NOT DETECTED NOT DETECTED Final   Klebsiella pneumoniae NOT DETECTED NOT DETECTED Final   Proteus species NOT DETECTED NOT DETECTED Final   Salmonella species NOT DETECTED NOT DETECTED Final   Serratia marcescens NOT DETECTED NOT DETECTED Final   Haemophilus influenzae NOT DETECTED NOT DETECTED Final   Neisseria meningitidis NOT DETECTED NOT DETECTED Final   Pseudomonas aeruginosa NOT DETECTED NOT DETECTED Final   Stenotrophomonas maltophilia NOT DETECTED NOT DETECTED Final   Candida albicans NOT DETECTED NOT DETECTED  Final   Candida auris NOT DETECTED NOT DETECTED Final   Candida glabrata NOT DETECTED NOT DETECTED Final   Candida krusei NOT DETECTED NOT DETECTED Final   Candida parapsilosis NOT DETECTED NOT DETECTED Final   Candida tropicalis NOT DETECTED NOT DETECTED Final   Cryptococcus neoformans/gattii NOT DETECTED NOT DETECTED Final    Comment: Performed at [facility], [address] [place], [place] [address]  Culture, blood (Routine x 2)     Status: Abnormal   Collection Time: 11/26/23 12:23 PM   Specimen: BLOOD LEFT ARM  Result Value Ref Range Status   Specimen Description   Final    BLOOD LEFT ARM Performed at [facility], [address], [place], [place] [address]    Special Requests   Final    BOTTLES DRAWN AEROBIC AND ANAEROBIC Blood Culture results may not be optimal due to an inadequate volume of blood received in culture bottles Performed at [facility], [address] [place], [place] [address]    Culture  Setup  Time   Final    GRAM POSITIVE COCCI AEROBIC BOTTLE ONLY CRITICAL RESULT CALLED TO, READ BACK BY AND VERIFIED WITH: BRIANA ALLEY ON 11/27/23 AT 1107 QSD    Culture (A)  Final    STAPHYLOCOCCUS EPIDERMIDIS THE SIGNIFICANCE OF ISOLATING THIS ORGANISM FROM A SINGLE SET OF BLOOD CULTURES WHEN MULTIPLE SETS ARE DRAWN IS UNCERTAIN. PLEASE NOTIFY THE MICROBIOLOGY DEPARTMENT WITHIN ONE WEEK IF SPECIATION AND SENSITIVITIES ARE REQUIRED. Performed at [facility], [address], [place], [place] [address]    Report Status 11/29/2023 FINAL  Final  Blood Culture ID Panel (Reflexed)     Status: Abnormal   Collection Time: 11/26/23 12:23 PM  Result Value Ref Range Status   Enterococcus faecalis NOT DETECTED NOT DETECTED Final   Enterococcus Faecium NOT DETECTED NOT DETECTED Final   Listeria monocytogenes NOT DETECTED NOT DETECTED Final   Staphylococcus species DETECTED (A) NOT DETECTED Final    Comment: CRITICAL RESULT CALLED TO, READ BACK BY  AND VERIFIED WITH: BRIANA ALLEY ON 11/27/23 AT 1107 QSD    Staphylococcus aureus (BCID) NOT DETECTED NOT DETECTED Final   Staphylococcus epidermidis DETECTED (A) NOT DETECTED Final    Comment: CRITICAL RESULT CALLED TO, READ BACK BY AND VERIFIED WITH: BRIANA ALLEY ON 11/27/23 AT 1107 QSD    Staphylococcus lugdunensis NOT DETECTED NOT DETECTED Final   Streptococcus species NOT DETECTED NOT DETECTED Final   Streptococcus agalactiae NOT DETECTED NOT DETECTED Final   Streptococcus pneumoniae NOT DETECTED NOT DETECTED Final   Streptococcus pyogenes NOT DETECTED NOT DETECTED Final   A.calcoaceticus-baumannii NOT DETECTED NOT DETECTED Final   Bacteroides fragilis NOT DETECTED NOT DETECTED Final   Enterobacterales NOT DETECTED NOT DETECTED Final   Enterobacter cloacae complex NOT DETECTED NOT DETECTED Final   Escherichia coli NOT DETECTED NOT DETECTED Final   Klebsiella aerogenes NOT DETECTED NOT DETECTED Final   Klebsiella oxytoca NOT DETECTED NOT DETECTED Final   Klebsiella pneumoniae NOT DETECTED NOT DETECTED Final   Proteus species NOT DETECTED NOT DETECTED Final   Salmonella species NOT DETECTED NOT DETECTED Final   Serratia marcescens NOT DETECTED NOT DETECTED Final   Haemophilus influenzae NOT DETECTED NOT DETECTED Final   Neisseria meningitidis NOT DETECTED NOT DETECTED Final   Pseudomonas aeruginosa NOT DETECTED NOT DETECTED Final   Stenotrophomonas maltophilia NOT DETECTED NOT DETECTED Final   Candida albicans NOT DETECTED NOT DETECTED Final   Candida auris NOT DETECTED NOT DETECTED Final   Candida glabrata NOT DETECTED NOT DETECTED Final   Candida krusei NOT DETECTED NOT DETECTED Final   Candida parapsilosis NOT DETECTED NOT DETECTED Final   Candida tropicalis NOT DETECTED NOT DETECTED Final   Cryptococcus neoformans/gattii NOT DETECTED NOT DETECTED Final   Methicillin resistance mecA/C NOT DETECTED NOT DETECTED Final    Comment: Performed at [facility], [address] [place], [place] [address]  Body fluid culture w Gram Stain     Status: None   Collection Time: 11/26/23  8:55 PM   Specimen: PATH Cytology Peritoneal fluid  Result Value Ref Range Status   Specimen Description   Final    PERITONEAL CYTO Performed at [facility], [address] [place], [place] [address]    Special Requests   Final    NONE Performed at [facility], [address] [place], [place] [address]    Gram Stain   Final    FEW WBC PRESENT,BOTH PMN AND MONONUCLEAR FEW GRAM POSITIVE COCCI IN CHAINS MODERATE GRAM NEGATIVE  RODS FEW GRAM POSITIVE RODS Performed at [facility], [address], [place], [place] [address]    Culture   Final    MODERATE STREPTOCOCCUS MITIS/ORALIS RARE CITROBACTER AMALONATICUS    Report Status 11/30/2023 FINAL  Final   Organism ID, Bacteria STREPTOCOCCUS MITIS/ORALIS  Final   Organism ID, Bacteria CITROBACTER AMALONATICUS  Final      Susceptibility   Citrobacter amalonaticus - MIC*    CEFEPIME <=0.12 SENSITIVE Sensitive     CEFTAZIDIME <=1 SENSITIVE Sensitive     CEFTRIAXONE <=0.25 SENSITIVE Sensitive     CIPROFLOXACIN <=0.25 SENSITIVE Sensitive     GENTAMICIN <=1 SENSITIVE Sensitive     IMIPENEM <=0.25 SENSITIVE Sensitive     TRIMETH/SULFA <=20 SENSITIVE Sensitive     PIP/TAZO <=4 SENSITIVE Sensitive ug/mL    * RARE CITROBACTER AMALONATICUS   Streptococcus mitis/oralis - MIC*    PENICILLIN 0.25 INTERMEDIATE Intermediate     CEFTRIAXONE 0.25 SENSITIVE Sensitive     LEVOFLOXACIN 1 SENSITIVE Sensitive     VANCOMYCIN 0.5 SENSITIVE Sensitive     * MODERATE STREPTOCOCCUS MITIS/ORALIS  Culture, blood (Routine X 2) w Reflex to ID Panel     Status: None   Collection Time: 11/28/23  5:43 AM   Specimen: BLOOD LEFT ARM  Result Value Ref Range Status   Specimen Description BLOOD LEFT ARM  Final   Special Requests   Final    BOTTLES DRAWN AEROBIC AND ANAEROBIC Blood Culture results may not be optimal due to an  inadequate volume of blood received in culture bottles   Culture   Final    NO GROWTH 5 DAYS Performed at [facility], [address] [place], [place] [address]    Report Status 12/03/2023 FINAL  Final  Culture, blood (Routine X 2) w Reflex to ID Panel     Status: None   Collection Time: 11/28/23  5:47 AM   Specimen: BLOOD RIGHT HAND  Result Value Ref Range Status   Specimen Description BLOOD RIGHT HAND  Final   Special Requests   Final    BOTTLES DRAWN AEROBIC AND ANAEROBIC Blood Culture results may not be optimal due to an inadequate volume of blood received in culture bottles   Culture   Final    NO GROWTH 5 DAYS Performed at [facility], [address], [place], [place] [address]    Report Status 12/03/2023 FINAL  Final  Body fluid culture w Gram Stain     Status: None   Collection Time: 12/01/23 10:10 AM   Specimen: Ascitic; Body Fluid  Result Value Ref Range Status   Specimen Description   Final    ASCITIC Performed at [facility], [address] [place], [place] [address]    Special Requests   Final    peri Performed at [facility], [address] [place], [place] [address]    Gram Stain   Final    FEW WBC PRESENT, PREDOMINANTLY PMN ABUNDANT GRAM NEGATIVE RODS ABUNDANT GRAM POSITIVE COCCI RARE GRAM POSITIVE RODS    Culture   Final    FEW STREPTOCOCCUS MITIS/ORALIS RARE CITROBACTER AMALONATICUS MODERATE BACTEROIDES THETAIOTAOMICRON BETA LACTAMASE POSITIVE RARE EIKENELLA CORRODENS Usually susceptible to penicillin and other beta lactam agents,quinolones,macrolides and tetracyclines. Performed at [facility], [address] [place], [place] [address]    Report Status 12/06/2023 FINAL  Final   Organism ID, Bacteria STREPTOCOCCUS MITIS/ORALIS  Final   Organism ID, Bacteria CITROBACTER AMALONATICUS  Final  Susceptibility   Citrobacter amalonaticus - MIC*    CEFEPIME <=0.12 SENSITIVE Sensitive      CEFTAZIDIME <=1 SENSITIVE Sensitive     CEFTRIAXONE <=0.25 SENSITIVE Sensitive     CIPROFLOXACIN <=0.25 SENSITIVE Sensitive     GENTAMICIN <=1 SENSITIVE Sensitive     IMIPENEM <=0.25 SENSITIVE Sensitive     TRIMETH/SULFA <=20 SENSITIVE Sensitive     PIP/TAZO <=4 SENSITIVE Sensitive ug/mL    * RARE CITROBACTER AMALONATICUS   Streptococcus mitis/oralis - MIC*    PENICILLIN <=0.06 SENSITIVE Sensitive     CEFTRIAXONE 0.25 SENSITIVE Sensitive     LEVOFLOXACIN 1 SENSITIVE Sensitive     VANCOMYCIN 0.5 SENSITIVE Sensitive     * FEW STREPTOCOCCUS MITIS/ORALIS    Coagulation Studies: No results for input(s): "LABPROT", "INR" in the last 72 hours.   Urinalysis: No results for input(s): "COLORURINE", "LABSPEC", "PHURINE", "GLUCOSEU", "HGBUR", "BILIRUBINUR", "KETONESUR", "PROTEINUR", "UROBILINOGEN", "NITRITE", "LEUKOCYTESUR" in the last 72 hours.  Invalid input(s): "APPERANCEUR"    Imaging: No results found.       Medications:       amLODipine  10 mg Oral Daily   apixaban  2.5 mg Oral BID   Chlorhexidine Gluconate Cloth  6 each Topical Q0600   ciprofloxacin  250 mg Oral QHS   epoetin alfa-epbx (RETACRIT) injection  10,000 Units Intravenous Q M,W,F-HD   feeding supplement  237 mL Oral TID BM   insulin aspart  0-6 Units Subcutaneous TID WC   irbesartan  300 mg Oral Daily   lactulose  20 g Oral BID   metoprolol tartrate  25 mg Oral BID   multivitamin  1 tablet Oral QHS   pantoprazole  40 mg Oral Daily   tamsulosin  0.4 mg Oral Daily   acetaminophen, alum & mag hydroxide-simeth, antiseptic oral rinse, hydrALAZINE, hydrOXYzine, ondansetron (ZOFRAN) IV  Assessment/ Plan:  Casey Franco is a 63 y.o.  male with HTN, ESRD, DM-2, Hep C.  Patient presents with chest pain and has been admitted for Intractable vomiting [R11.10] Abdominal pain [R10.9] Pneumonia of both lungs due to infectious organism, unspecified part of lung [J18.9] Sepsis without acute organ dysfunction, due  to unspecified organism [facility]) [A41.9]  [facility]/MWF/right chest PermCath/71.0 kg  End-stage renal disease on hemodialysis.  Received dialysis earlier today, UF 0. Next treatment scheduled for Monday.   2. Anemia of chronic kidney disease Lab Results  Component Value Date   HGB 7.7 (L) 12/18/2023  Hgb decreased.  Continue Epogen 10,000 units IV with dialysis treatments.  3. Secondary Hyperparathyroidism: with outpatient labs: PTH 351, phosphorus 6.6, calcium 8.1 on 11/23/2023.   Lab Results  Component Value Date   PTH 31 08/30/2018   CALCIUM 8.1 (L) 12/18/2023   PHOS 2.9 12/18/2023    Will continue to monitor patient's bone minerals.  4.  Hypertension with chronic kidney disease.  Home regimen includes furosemide, hydralazine, amlodipine, and irbesartan. Amlodipine, Avapro, and metoprolol currently prescribed.  Blood pressure stable during dialysis.  5.  Cirrhosis/ascites, spontaneous bacterial peritonitis. Paracentesis from 11/27/2023 yielded 1.6 L of fluid.  Complex loculations suspected as more ascites present. Microbiology results show Staph epidermidis in the blood. Peritoneal fluid culture from 11/26/2023 shows Streptococcus and Citrobacter. Lactulose twice a day. Paracentesis on 12/02/23 with 4.6L removed. Fluid cultures negative  Patient remains on cipro.     LOS: 22 Caroleena Paolini 3/7/202512:29 PM

## 2023-12-18 NOTE — Progress Notes (Signed)
 Occupational Therapy Treatment Patient Details Name: Casey Franco MRN: [id number] DOB: 20-Sep-1961 Today's Date: 12/18/2023   History of present illness presented to ER secondary to abdominal pain, chest pain and coffee-ground emesis; admitted for management of spontaneous bacterial peritonitis.  s/p paracentesis with 1.6L removed on 2/13   OT comments  Pt is seated in recliner with NT present on arrival. Pt with BM in bed requiring total assist for clean up from NT and linen change. He denies pain. Pt is very upset with denial from insurance. Pt wishing to return to bed from recliner, but declined any further activity. See details below for needed assistance. He remains with deficits in safety and balance, as well as needed high levels of assist for LB ADLs. Pt returned to bed with all needs in place and will cont to require skilled acute OT services to maximize his safety and IND to return to PLOF.       If plan is discharge home, recommend the following:  A little help with walking and/or transfers;Assistance with cooking/housework;Direct supervision/assist for financial management;Supervision due to cognitive status;Direct supervision/assist for medications management;Help with stairs or ramp for entrance;A little help with bathing/dressing/bathroom   Equipment Recommendations  Other (comment) (defer)    Recommendations for Other Services      Precautions / Restrictions Restrictions Weight Bearing Restrictions Per Provider Order: No       Mobility Bed Mobility Overal bed mobility: Needs Assistance Bed Mobility: Sit to Supine       Sit to supine: Min assist   General bed mobility comments: min A for BLE management to return to supine; Mod A x2 to scoot to [facility]    Transfers Overall transfer level: Needs assistance Equipment used: Rolling walker (2 wheels) Transfers: Sit to/from Stand Sit to Stand: Contact guard assist Stand pivot transfers: Contact guard assist          General transfer comment: CGA for STS from recliner, CGA for safety with SPT back to bed from recliner     Balance Overall balance assessment: Needs assistance Sitting-balance support: Feet supported, No upper extremity supported Sitting balance-Leahy Scale: Good     Standing balance support: Bilateral upper extremity supported, During functional activity, Reliant on assistive device for balance Standing balance-Leahy Scale: Fair Standing balance comment: CGA using RW                           ADL either performed or assessed with clinical judgement   ADL Overall ADL's : Needs assistance/impaired                         Toilet Transfer: Contact guard assist;Rolling walker (2 wheels) Toilet Transfer Details (indicate cue type and reason): simulated from recliner to bed                Extremity/Trunk Assessment              Vision       Perception     Praxis     Communication Communication Communication: No apparent difficulties   Cognition Arousal: Alert                                   Following commands: Intact        Cueing      Exercises      Shoulder Instructions  General Comments      Pertinent Vitals/ Pain       Pain Assessment Pain Assessment: No/denies pain  Home Living                                          Prior Functioning/Environment              Frequency  Min 2X/week        Progress Toward Goals  OT Goals(current goals can now be found in the care plan section)  Progress towards OT goals: Progressing toward goals  Acute Rehab OT Goals Patient Stated Goal: go to rehab OT Goal Formulation: With patient Time For Goal Achievement: 12/29/23 Potential to Achieve Goals: Fair  Plan      Co-evaluation                 AM-PAC OT "6 Clicks" Daily Activity     Outcome Measure   Help from another person eating meals?: None Help from another  person taking care of personal grooming?: None Help from another person toileting, which includes using toliet, bedpan, or urinal?: A Lot Help from another person bathing (including washing, rinsing, drying)?: A Lot Help from another person to put on and taking off regular upper body clothing?: A Little Help from another person to put on and taking off regular lower body clothing?: A Lot 6 Click Score: 17    End of Session Equipment Utilized During Treatment: Rolling walker (2 wheels)  OT Visit Diagnosis: Other abnormalities of gait and mobility (R26.89);Unsteadiness on feet (R26.81);Muscle weakness (generalized) (M62.81)   Activity Tolerance Patient tolerated treatment well   Patient Left in bed;with call bell/phone within reach;with bed alarm set   Nurse Communication Mobility status        Time: 8119-1478 OT Time Calculation (min): 11 min  Charges: OT General Charges $OT Visit: 1 Visit OT Treatments $Therapeutic Activity: 8-22 mins  Karrie Fluellen, OTR/L  12/18/23, 3:45 PM   Kehlani Vancamp E Audrianna Driskill 12/18/2023, 3:43 PM

## 2023-12-18 NOTE — Progress Notes (Signed)
 PT Cancellation Note  Patient Details Name: [name] MRN: [id number] DOB: Mar 01, 1961   Cancelled Treatment:     PT attempt. Pt off floor for HD. Acute PT will continue to follow per current POC progressing as able.   Casey Franco 12/18/2023, 11:25 AM

## 2023-12-18 NOTE — Plan of Care (Signed)

## 2023-12-19 DIAGNOSIS — K652 Spontaneous bacterial peritonitis: Secondary | ICD-10-CM | POA: Diagnosis not present

## 2023-12-19 DIAGNOSIS — N186 End stage renal disease: Secondary | ICD-10-CM | POA: Diagnosis not present

## 2023-12-19 DIAGNOSIS — I10 Essential (primary) hypertension: Secondary | ICD-10-CM | POA: Diagnosis not present

## 2023-12-19 DIAGNOSIS — K7460 Unspecified cirrhosis of liver: Secondary | ICD-10-CM | POA: Diagnosis not present

## 2023-12-19 LAB — GLUCOSE, CAPILLARY
Glucose-Capillary: 120 mg/dL — ABNORMAL HIGH (ref 70–99)
Glucose-Capillary: 142 mg/dL — ABNORMAL HIGH (ref 70–99)
Glucose-Capillary: 139 mg/dL — ABNORMAL HIGH (ref 70–99)
Glucose-Capillary: 131 mg/dL — ABNORMAL HIGH (ref 70–99)

## 2023-12-19 NOTE — Progress Notes (Signed)
 Mobility Specialist - Progress Note   12/19/23 1100  Mobility  Activity Refused mobility     Pt refused mobility; no reason specified. Pt states that "they denied me because of yall...said I'm walking too good, so now I'm going to work out on my own time". Pt encouraged to continue activity throughout stay to prevent deterioration and decline, pt showed understanding. Will attempt another date/time as pt is agreeable.    Filiberto Pinks Mobility Specialist 12/19/23, 11:15 AM

## 2023-12-19 NOTE — Progress Notes (Signed)
 Progress Note   Patient: Casey Franco [id number]:[id number] DOB: 1961/05/07 DOA: 11/26/2023     23 DOS: the patient was seen and examined on 12/19/2023   Brief hospital course: TAJE LITTLER is a 63 y.o. 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  He also complained of nausea, increasing abdominal distention and multiple episodes of vomiting.  Patient is diagnosed with spontaneous bacterial peritonitis, he has completed 2 weeks of antibiotics, currently on prophylactic Cipro. Patient also had a paracentesis on 2/18 with removal of 4.6 L of fluids. Patient condition has improved, patient parents has denied the patient nursing home placement, patient decided to appeal for the discharge.   Principal Problem:   Spontaneous bacterial peritonitis (HCC) Active Problems:   Abdominal pain   Cirrhosis of liver with ascites (HCC)   Myocardial injury   Essential hypertension   ESRD (end stage renal disease) (HCC)   Coffee ground emesis   Paroxysmal atrial fibrillation (HCC)   Sepsis without acute organ dysfunction (HCC)   Thrombocytopenia (HCC)   Pressure injury of skin   Assessment and Plan:  Spontaneous bacterial peritonitis Abdominal pain Liver cirrhosis with ascites: S/p paracentesis with removal of 1600 mL of fluid on 11/26/2023.   Fluid positive for SBP (ascitic fluid total nucleated cells 1,326, percentage neutrophils was 68% and absolute PMN count 901) Ascitic fluid culture showed Streptococcus mitis and Citrobacter amalonaticus S/p repeat paracentesis on 12/01/2023 with removal of 4.6 L of fluid. Percentage neutrophil count was 100 and total nucleated cell count 4,687. He was evaluated by the general surgeon.  No indication for surgery at this time.   Has completed 2 weeks of antibiotics for  SBP Continue on SBP prophylaxis with ciprofloxacin Patient current condition stable, abdominal distention at baseline, no need for repeat paracentesis.     Bacteroides  thetaiotaomicron bacteremia (blood cultures from 11/26/2023 growing gram-negative rods).   Has completed 2 weeks of metronidazole as recommended by infectious disease on 12/14/2023 Blood culture also positive for staph epi which he appeared to be a contaminant.     Hematemesis: Resolved.  This is probably from multiple episodes of vomiting.   H&H is stable.     Elevated troponins: Troponin is 172, 138, 127 and 128.  No chest pain or acute EKG changes. Due to demand ischemia.     Paroxysmal atrial fibrillation, PVCs: Heart rate is better.  Continue metoprolol.  We discussed risks, benefits and alternatives to long-term anticoagulation for atrial fibrillation. He is at increased risk for bleeding from long-term anticoagulation because of underlying severe liver and kidney disease. CHA2DS2-VASc score is 3 and he is also at increased risk for stroke. Patient has agreed to Eliquis Eliquis initiated as hemoglobin is stabilized   Chronic systolic CHF Moderate mitral regurgitation 2D echo showed EF estimated at 40 to 45%, moderate LVH, indeterminate left ventricular diastolic parameters, mildly reduced RV systolic function, moderately enlarged RV size, moderately dilated right atrium, small pericardial effusion, moderate mitral regurgitation --Volume management by dialysis   Confusion on 11/30/2023, likely acute toxic encephalopathy from oxycodone: Oxycodone has been discontinued. Continue delirium precaution     ESRD on hemodialysis MWF Hyponatremia. --Nephrology following for hemodialysis    Dysphagia: Speech therapist recommended dysphagia 2 diet, thin liquids.     General weakness: PT recommended discharge to SNF.   Follow-up with TOC to assist  with placement.     Essential hypertension Continue current  antihypertensives   type II DM   Glucose not significant elevated.   Pressure ulcer: POA Pressure Injury 12/02/23 Sacrum Mid Stage 1 -  Intact skin with non-blanchable redness of a localized area usually over a bony prominence. (Active)  12/02/23 1500  Location: Sacrum  Location Orientation: Mid  Staging: Stage 1 -  Intact skin with non-blanchable redness of a localized area usually over a bony prominence.  Wound Description (Comments):   Present on Admission: No    Doing well today, pending appeal for nursing home placement.  No change in treatment plan.     Subjective: Has no complaint.  Physical Exam: Vitals:   12/18/23 1532 12/18/23 2102 12/19/23 0355 12/19/23 0843  BP: 113/79 119/82 (!) 100/59 132/86  Pulse: 79 81 77 78  Resp: 17 20 16 18   Temp: 98 F (36.7 C) 98.5 F (36.9 C) 98.2 F (36.8 C) 98 F (36.7 C)  TempSrc: Oral Oral Oral   SpO2: 100% 99% 100% 100%  Weight:      Height:       General exam: Appears calm and comfortable  Respiratory system: Clear to auscultation. Respiratory effort normal. Cardiovascular system: S1 & S2 heard, RRR. No JVD, murmurs, rubs, gallops or clicks. No pedal edema. Gastrointestinal system: Abdomen is distended, soft and nontender. No organomegaly or masses felt. Normal bowel sounds heard. Central nervous system: Alert and oriented. No focal neurological deficits. Extremities: Symmetric 5 x 5 power. Skin: No rashes, lesions or ulcers Psychiatry: Judgement and insight appear normal. Mood & affect appropriate.    Data Reviewed:  There are no new results to review at this time.  Family Communication: None  Disposition: Status is: Inpatient Remains inpatient appropriate because: Unsafe discharge option.     Time spent: 25 minutes  Author: Marrion Coy, MD 12/19/2023 10:16 AM  For on call review [email or web address].

## 2023-12-19 NOTE — Plan of Care (Signed)
  Problem: Education: Goal: Knowledge of General Education information will improve Description: Including pain rating scale, medication(s)/side effects and non-pharmacologic comfort measures Outcome: Progressing   Problem: Nutrition: Goal: Adequate nutrition will be maintained Outcome: Progressing   Problem: Elimination: Goal: Will not experience complications related to bowel motility Outcome: Progressing Goal: Will not experience complications related to urinary retention Outcome: Progressing   Problem: Pain Managment: Goal: General experience of comfort will improve and/or be controlled Outcome: Progressing

## 2023-12-19 NOTE — TOC Progression Note (Signed)
 Transition of Care [facility]) - Progression Note    Patient Details  Name: Casey Franco MRN: [id number] Date of Birth: September 11, 1961  [facility] CM/SW Contact  Rodney Langton, RN Phone Number: 12/19/2023, 9:33 AM  Clinical Narrative:      Called patient's sister to follow up on appeal, no answer, unable to leave message, voice mail full.  Will continue to attempt to contact sister.       Expected Discharge Plan and Services         Expected Discharge Date: 12/15/23                                     Social Determinants of Health (SDOH) Interventions SDOH Screenings   Food Insecurity: Patient Declined (11/28/2023)  Housing: Low Risk  (12/06/2023)  Transportation Needs: Patient Declined (11/28/2023)  Utilities: Not At Risk (11/28/2023)  Financial Resource Strain: Low Risk  (05/15/2020)   Received from [facility], [facility]  Tobacco Use: Medium Risk (11/26/2023)    Readmission Risk Interventions     No data to display

## 2023-12-19 NOTE — Progress Notes (Signed)
 [facility]  ROUNDING NOTE   Subjective:   Casey Franco is a 63 y.o. male with past medical history of HTN, ESRD, DM-2, and Hep C. Patient presents to emergency department with abdominal pain. He has been admitted for Intractable vomiting [R11.10] Abdominal pain [R10.9] Pneumonia of both lungs due to infectious organism, unspecified part of lung [J18.9] Sepsis without acute organ dysfunction, due to unspecified organism [facility]) [A41.9]  Patient is known to our practice and receives outpatient dialysis at [facility] on a MWF, last treatment on Tuesday.    Update  Patient seen sitting up in bed Alert and oriented Appetite continues to improve, partially completed breakfast tray at bedside Denies nausea or vomiting Denies pain or bleeding from PermCath site Reports discomfort, burning sensation at left groin  Objective:  Vital signs in last 24 hours:  Temp:  [98 F (36.7 C)-98.5 F (36.9 C)] 98 F (36.7 C) (03/08 0843) Pulse Rate:  [77-81] 78 (03/08 0843) Resp:  [16-20] 18 (03/08 0843) BP: (100-132)/(59-86) 132/86 (03/08 0843) SpO2:  [99 %-100 %] 100 % (03/08 0843)  Weight change:  Filed Weights   12/16/23 0915 12/18/23 0756 12/18/23 1218  Weight: 61.9 kg 60.8 kg 60.8 kg    Intake/Output: No intake/output data recorded.   Intake/Output this shift:  No intake/output data recorded.  Physical Exam: General: NAD  Head: Normocephalic, atraumatic. Moist oral mucosal membranes  Eyes: Anicteric  Lungs:  Clear to auscultation, normal effort  Heart: Regular rate and rhythm, aflutter  Abdomen:  Soft, tender, mild distension  Extremities: No peripheral edema.  Neurologic: Alert and oriented, moving all four extremities  Skin: No lesions  Access: Right chest tunneled catheter exchanged on 12/15/23    Basic Metabolic Panel: Recent Labs  Lab 12/13/23 0701 12/14/23 0418 12/15/23 0354 12/16/23 0414 12/18/23 0813  NA 135 135 133* 133* 133*  K 3.6 3.7  3.5 3.9 3.5  CL 101 100 98 100 96*  CO2 24 22 24 23 26   GLUCOSE 82 94 90 126* 122*  BUN 21 27* 18 28* 26*  CREATININE 5.79* 6.72* 4.48* 6.28* 5.90*  CALCIUM 8.1* 8.1* 7.9* 8.2* 8.1*  PHOS  --   --   --  3.3 2.9    Liver Function Tests: Recent Labs  Lab 12/18/23 0813  ALBUMIN 2.0*    No results for input(s): "LIPASE", "AMYLASE" in the last 168 hours.  No results for input(s): "AMMONIA" in the last 168 hours.   CBC: Recent Labs  Lab 12/13/23 0701 12/14/23 0418 12/15/23 0354 12/16/23 0414 12/18/23 0813  WBC 4.8 4.9 5.9 5.9 7.2  NEUTROABS 2.8 3.1 3.8 3.6  --   HGB 8.4* 8.3* 8.2* 8.4* 7.7*  HCT 26.4* 25.0* 24.5* 26.4* 24.0*  MCV 90.4 89.9 89.1 91.0 92.3  PLT 129* 130* 139* 144* 145*    Cardiac Enzymes: No results for input(s): "CKTOTAL", "CKMB", "CKMBINDEX", "TROPONINI" in the last 168 hours.  BNP: Invalid input(s): "POCBNP"  CBG: Recent Labs  Lab 12/18/23 1249 12/18/23 1835 12/18/23 2103 12/19/23 0842 12/19/23 1110  GLUCAP 94 145* 153* 120* 142*    Microbiology: Results for orders placed or performed during the hospital encounter of 11/26/23  Culture, blood (Routine x 2)     Status: Abnormal   Collection Time: 11/26/23 11:22 AM   Specimen: BLOOD  Result Value Ref Range Status   Specimen Description   Final    BLOOD RIGHT ANTECUBITAL Performed at [facility], [address], [place], [place]  [address]    Special Requests   Final    BOTTLES DRAWN AEROBIC AND ANAEROBIC Blood Culture results may not be optimal due to an inadequate volume of blood received in culture bottles Performed at [facility], [address] [place], [place] [address]    Culture  Setup Time   Final    ANAEROBIC BOTTLE ONLY GRAM NEGATIVE RODS CRITICAL RESULT CALLED TO, READ BACK BY AND VERIFIED WITH: CAROLINE COULTER @ 11/28/23 2033 AB    Culture (A)  Final    BACTEROIDES THETAIOTAOMICRON BETA LACTAMASE POSITIVE Performed at [facility], [address] [place], [place] [address]    Report Status 12/01/2023 FINAL  Final  Blood Culture ID Panel (Reflexed)     Status: None   Collection Time: 11/26/23 11:22 AM  Result Value Ref Range Status   Enterococcus faecalis NOT DETECTED NOT DETECTED Final   Enterococcus Faecium NOT DETECTED NOT DETECTED Final   Listeria monocytogenes NOT DETECTED NOT DETECTED Final   Staphylococcus species NOT DETECTED NOT DETECTED Final   Staphylococcus aureus (BCID) NOT DETECTED NOT DETECTED Final   Staphylococcus epidermidis NOT DETECTED NOT DETECTED Final   Staphylococcus lugdunensis NOT DETECTED NOT DETECTED Final   Streptococcus species NOT DETECTED NOT DETECTED Final   Streptococcus agalactiae NOT DETECTED NOT DETECTED Final   Streptococcus pneumoniae NOT DETECTED NOT DETECTED Final   Streptococcus pyogenes NOT DETECTED NOT DETECTED Final   A.calcoaceticus-baumannii NOT DETECTED NOT DETECTED Final   Bacteroides fragilis NOT DETECTED NOT DETECTED Final   Enterobacterales NOT DETECTED NOT DETECTED Final   Enterobacter cloacae complex NOT DETECTED NOT DETECTED Final   Escherichia coli NOT DETECTED NOT DETECTED Final   Klebsiella aerogenes NOT DETECTED NOT DETECTED Final   Klebsiella oxytoca NOT DETECTED NOT DETECTED Final   Klebsiella pneumoniae NOT DETECTED NOT DETECTED Final   Proteus species NOT DETECTED NOT DETECTED Final   Salmonella species NOT DETECTED NOT DETECTED Final   Serratia marcescens NOT DETECTED NOT DETECTED Final   Haemophilus influenzae NOT DETECTED NOT DETECTED Final   Neisseria meningitidis NOT DETECTED NOT DETECTED Final   Pseudomonas aeruginosa NOT DETECTED NOT DETECTED Final   Stenotrophomonas maltophilia NOT DETECTED NOT DETECTED Final   Candida albicans NOT DETECTED NOT DETECTED Final   Candida auris NOT DETECTED NOT DETECTED Final   Candida glabrata NOT DETECTED NOT DETECTED Final   Candida krusei NOT DETECTED NOT DETECTED Final   Candida parapsilosis NOT DETECTED NOT  DETECTED Final   Candida tropicalis NOT DETECTED NOT DETECTED Final   Cryptococcus neoformans/gattii NOT DETECTED NOT DETECTED Final    Comment: Performed at [facility], [address] [place] [address] [place], [place] [address]  Culture, blood (Routine x 2)     Status: Abnormal   Collection Time: 11/26/23 12:23 PM   Specimen: BLOOD LEFT ARM  Result Value Ref Range Status   Specimen Description   Final    BLOOD LEFT ARM Performed at [facility], [address] [place], [place] [address]    Special Requests   Final    BOTTLES DRAWN AEROBIC AND ANAEROBIC Blood Culture results may not be optimal due to an inadequate volume of blood received in culture bottles Performed at [facility], [address] [place], [place] [address]    Culture  Setup Time   Final    GRAM POSITIVE COCCI AEROBIC BOTTLE ONLY CRITICAL RESULT CALLED TO, READ BACK BY AND VERIFIED WITH: BRIANA ALLEY ON 11/27/23 AT 1107 QSD  Culture (A)  Final    STAPHYLOCOCCUS EPIDERMIDIS THE SIGNIFICANCE OF ISOLATING THIS ORGANISM FROM A SINGLE SET OF BLOOD CULTURES WHEN MULTIPLE SETS ARE DRAWN IS UNCERTAIN. PLEASE NOTIFY THE MICROBIOLOGY DEPARTMENT WITHIN ONE WEEK IF SPECIATION AND SENSITIVITIES ARE REQUIRED. Performed at [facility], [address] [place], [place] [address]    Report Status 11/29/2023 FINAL  Final  Blood Culture ID Panel (Reflexed)     Status: Abnormal   Collection Time: 11/26/23 12:23 PM  Result Value Ref Range Status   Enterococcus faecalis NOT DETECTED NOT DETECTED Final   Enterococcus Faecium NOT DETECTED NOT DETECTED Final   Listeria monocytogenes NOT DETECTED NOT DETECTED Final   Staphylococcus species DETECTED (A) NOT DETECTED Final    Comment: CRITICAL RESULT CALLED TO, READ BACK BY AND VERIFIED WITH: BRIANA ALLEY ON 11/27/23 AT 1107 QSD    Staphylococcus aureus (BCID) NOT DETECTED NOT DETECTED Final   Staphylococcus epidermidis DETECTED (A) NOT DETECTED Final    Comment:  CRITICAL RESULT CALLED TO, READ BACK BY AND VERIFIED WITH: BRIANA ALLEY ON 11/27/23 AT 1107 QSD    Staphylococcus lugdunensis NOT DETECTED NOT DETECTED Final   Streptococcus species NOT DETECTED NOT DETECTED Final   Streptococcus agalactiae NOT DETECTED NOT DETECTED Final   Streptococcus pneumoniae NOT DETECTED NOT DETECTED Final   Streptococcus pyogenes NOT DETECTED NOT DETECTED Final   A.calcoaceticus-baumannii NOT DETECTED NOT DETECTED Final   Bacteroides fragilis NOT DETECTED NOT DETECTED Final   Enterobacterales NOT DETECTED NOT DETECTED Final   Enterobacter cloacae complex NOT DETECTED NOT DETECTED Final   Escherichia coli NOT DETECTED NOT DETECTED Final   Klebsiella aerogenes NOT DETECTED NOT DETECTED Final   Klebsiella oxytoca NOT DETECTED NOT DETECTED Final   Klebsiella pneumoniae NOT DETECTED NOT DETECTED Final   Proteus species NOT DETECTED NOT DETECTED Final   Salmonella species NOT DETECTED NOT DETECTED Final   Serratia marcescens NOT DETECTED NOT DETECTED Final   Haemophilus influenzae NOT DETECTED NOT DETECTED Final   Neisseria meningitidis NOT DETECTED NOT DETECTED Final   Pseudomonas aeruginosa NOT DETECTED NOT DETECTED Final   Stenotrophomonas maltophilia NOT DETECTED NOT DETECTED Final   Candida albicans NOT DETECTED NOT DETECTED Final   Candida auris NOT DETECTED NOT DETECTED Final   Candida glabrata NOT DETECTED NOT DETECTED Final   Candida krusei NOT DETECTED NOT DETECTED Final   Candida parapsilosis NOT DETECTED NOT DETECTED Final   Candida tropicalis NOT DETECTED NOT DETECTED Final   Cryptococcus neoformans/gattii NOT DETECTED NOT DETECTED Final   Methicillin resistance mecA/C NOT DETECTED NOT DETECTED Final    Comment: Performed at [facility], [address] [place], [place] [address]  Body fluid culture w Gram Stain     Status: None   Collection Time: 11/26/23  8:55 PM   Specimen: PATH Cytology Peritoneal fluid  Result Value Ref Range Status    Specimen Description   Final    PERITONEAL CYTO Performed at [facility], [address] [place], [place] [address]    Special Requests   Final    NONE Performed at [facility], [address] [place], [place] [address]    Gram Stain   Final    FEW WBC PRESENT,BOTH PMN AND MONONUCLEAR FEW GRAM POSITIVE COCCI IN CHAINS MODERATE GRAM NEGATIVE RODS FEW GRAM POSITIVE RODS Performed at [facility], [address], [place], [place] [address]    Culture   Final    MODERATE STREPTOCOCCUS MITIS/ORALIS RARE CITROBACTER  AMALONATICUS    Report Status 11/30/2023 FINAL  Final   Organism ID, Bacteria STREPTOCOCCUS MITIS/ORALIS  Final   Organism ID, Bacteria CITROBACTER AMALONATICUS  Final      Susceptibility   Citrobacter amalonaticus - MIC*    CEFEPIME <=0.12 SENSITIVE Sensitive     CEFTAZIDIME <=1 SENSITIVE Sensitive     CEFTRIAXONE <=0.25 SENSITIVE Sensitive     CIPROFLOXACIN <=0.25 SENSITIVE Sensitive     GENTAMICIN <=1 SENSITIVE Sensitive     IMIPENEM <=0.25 SENSITIVE Sensitive     TRIMETH/SULFA <=20 SENSITIVE Sensitive     PIP/TAZO <=4 SENSITIVE Sensitive ug/mL    * RARE CITROBACTER AMALONATICUS   Streptococcus mitis/oralis - MIC*    PENICILLIN 0.25 INTERMEDIATE Intermediate     CEFTRIAXONE 0.25 SENSITIVE Sensitive     LEVOFLOXACIN 1 SENSITIVE Sensitive     VANCOMYCIN 0.5 SENSITIVE Sensitive     * MODERATE STREPTOCOCCUS MITIS/ORALIS  Culture, blood (Routine X 2) w Reflex to ID Panel     Status: None   Collection Time: 11/28/23  5:43 AM   Specimen: BLOOD LEFT ARM  Result Value Ref Range Status   Specimen Description BLOOD LEFT ARM  Final   Special Requests   Final    BOTTLES DRAWN AEROBIC AND ANAEROBIC Blood Culture results may not be optimal due to an inadequate volume of blood received in culture bottles   Culture   Final    NO GROWTH 5 DAYS Performed at [facility], [address] [place], [place] [address]    Report Status  12/03/2023 FINAL  Final  Culture, blood (Routine X 2) w Reflex to ID Panel     Status: None   Collection Time: 11/28/23  5:47 AM   Specimen: BLOOD RIGHT HAND  Result Value Ref Range Status   Specimen Description BLOOD RIGHT HAND  Final   Special Requests   Final    BOTTLES DRAWN AEROBIC AND ANAEROBIC Blood Culture results may not be optimal due to an inadequate volume of blood received in culture bottles   Culture   Final    NO GROWTH 5 DAYS Performed at [facility], [address], [place], [place] [address]    Report Status 12/03/2023 FINAL  Final  Body fluid culture w Gram Stain     Status: None   Collection Time: 12/01/23 10:10 AM   Specimen: Ascitic; Body Fluid  Result Value Ref Range Status   Specimen Description   Final    ASCITIC Performed at [facility], [address] [place], [place] [address]    Special Requests   Final    peri Performed at [facility], [address] [place], [place] [address]    Gram Stain   Final    FEW WBC PRESENT, PREDOMINANTLY PMN ABUNDANT GRAM NEGATIVE RODS ABUNDANT GRAM POSITIVE COCCI RARE GRAM POSITIVE RODS    Culture   Final    FEW STREPTOCOCCUS MITIS/ORALIS RARE CITROBACTER AMALONATICUS MODERATE BACTEROIDES THETAIOTAOMICRON BETA LACTAMASE POSITIVE RARE EIKENELLA CORRODENS Usually susceptible to penicillin and other beta lactam agents,quinolones,macrolides and tetracyclines. Performed at [facility], [address] [place], [place] [address]    Report Status 12/06/2023 FINAL  Final   Organism ID, Bacteria STREPTOCOCCUS MITIS/ORALIS  Final   Organism ID, Bacteria CITROBACTER AMALONATICUS  Final      Susceptibility   Citrobacter amalonaticus - MIC*    CEFEPIME <=0.12 SENSITIVE Sensitive     CEFTAZIDIME <=1 SENSITIVE Sensitive     CEFTRIAXONE <=0.25 SENSITIVE  Sensitive     CIPROFLOXACIN <=0.25 SENSITIVE Sensitive     GENTAMICIN <=1 SENSITIVE Sensitive     IMIPENEM <=0.25 SENSITIVE  Sensitive     TRIMETH/SULFA <=20 SENSITIVE Sensitive     PIP/TAZO <=4 SENSITIVE Sensitive ug/mL    * RARE CITROBACTER AMALONATICUS   Streptococcus mitis/oralis - MIC*    PENICILLIN <=0.06 SENSITIVE Sensitive     CEFTRIAXONE 0.25 SENSITIVE Sensitive     LEVOFLOXACIN 1 SENSITIVE Sensitive     VANCOMYCIN 0.5 SENSITIVE Sensitive     * FEW STREPTOCOCCUS MITIS/ORALIS    Coagulation Studies: No results for input(s): "LABPROT", "INR" in the last 72 hours.   Urinalysis: No results for input(s): "COLORURINE", "LABSPEC", "PHURINE", "GLUCOSEU", "HGBUR", "BILIRUBINUR", "KETONESUR", "PROTEINUR", "UROBILINOGEN", "NITRITE", "LEUKOCYTESUR" in the last 72 hours.  Invalid input(s): "APPERANCEUR"    Imaging: No results found.       Medications:       amLODipine  10 mg Oral Daily   apixaban  2.5 mg Oral BID   Chlorhexidine Gluconate Cloth  6 each Topical Q0600   ciprofloxacin  250 mg Oral QHS   epoetin alfa-epbx (RETACRIT) injection  10,000 Units Intravenous Q M,W,F-HD   feeding supplement  237 mL Oral TID BM   insulin aspart  0-6 Units Subcutaneous TID WC   irbesartan  300 mg Oral Daily   lactulose  20 g Oral BID   metoprolol tartrate  25 mg Oral BID   multivitamin  1 tablet Oral QHS   pantoprazole  40 mg Oral Daily   tamsulosin  0.4 mg Oral Daily   acetaminophen, alum & mag hydroxide-simeth, antiseptic oral rinse, hydrALAZINE, hydrOXYzine, ondansetron (ZOFRAN) IV  Assessment/ Plan:  Mr. Casey Franco is a 63 y.o.  male with HTN, ESRD, DM-2, Hep C.  Patient presents with chest pain and has been admitted for Intractable vomiting [R11.10] Abdominal pain [R10.9] Pneumonia of both lungs due to infectious organism, unspecified part of lung [J18.9] Sepsis without acute organ dysfunction, due to unspecified organism [facility]) [A41.9]  [facility]/MWF/right chest PermCath/71.0 kg  End-stage renal disease on hemodialysis.  Next treatment scheduled for Monday.   2.  Anemia of chronic kidney disease Lab Results  Component Value Date   HGB 7.7 (L) 12/18/2023  Patient reports no signs of active bleeding. Continue Epogen 10,000 units IV with dialysis treatments.  3. Secondary Hyperparathyroidism: with outpatient labs: PTH 351, phosphorus 6.6, calcium 8.1 on 11/23/2023.   Lab Results  Component Value Date   PTH 31 08/30/2018   CALCIUM 8.1 (L) 12/18/2023   PHOS 2.9 12/18/2023    Bone minerals acceptable.  Will continue to monitor during this admission.  4.  Hypertension with chronic kidney disease.  Home regimen includes furosemide, hydralazine, amlodipine, and irbesartan. Amlodipine, Avapro, and metoprolol currently prescribed.  Blood pressure 100/59.  5.  Cirrhosis/ascites, spontaneous bacterial peritonitis. Paracentesis from 11/27/2023 yielded 1.6 L of fluid.  Complex loculations suspected as more ascites present. Microbiology results show Staph epidermidis in the blood. Peritoneal fluid culture from 11/26/2023 shows Streptococcus and Citrobacter. Lactulose twice a day. Paracentesis on 12/02/23 with 4.6L removed. Fluid cultures negative  Patient remains on cipro.     LOS: 23 Reneisha Stilley 3/8/20251:13 PM

## 2023-12-20 DIAGNOSIS — K7460 Unspecified cirrhosis of liver: Secondary | ICD-10-CM | POA: Diagnosis not present

## 2023-12-20 DIAGNOSIS — R188 Other ascites: Secondary | ICD-10-CM | POA: Diagnosis not present

## 2023-12-20 DIAGNOSIS — N186 End stage renal disease: Secondary | ICD-10-CM | POA: Diagnosis not present

## 2023-12-20 DIAGNOSIS — K652 Spontaneous bacterial peritonitis: Secondary | ICD-10-CM | POA: Diagnosis not present

## 2023-12-20 LAB — GLUCOSE, CAPILLARY
Glucose-Capillary: 91 mg/dL (ref 70–99)
Glucose-Capillary: 149 mg/dL — ABNORMAL HIGH (ref 70–99)
Glucose-Capillary: 128 mg/dL — ABNORMAL HIGH (ref 70–99)
Glucose-Capillary: 132 mg/dL — ABNORMAL HIGH (ref 70–99)

## 2023-12-20 NOTE — Progress Notes (Signed)
 [facility]  ROUNDING NOTE   Subjective:   Casey Franco is a 63 y.o. male with past medical history of HTN, ESRD, DM-2, and Hep C. Patient presents to emergency department with abdominal pain. He has been admitted for Intractable vomiting [R11.10] Abdominal pain [R10.9] Pneumonia of both lungs due to infectious organism, unspecified part of lung [J18.9] Sepsis without acute organ dysfunction, due to unspecified organism [facility]) [A41.9]  Patient is known to our practice and receives outpatient dialysis at [facility] on a MWF, last treatment on Tuesday.    Update  Patient resting quietly Awaiting breakfast Remains on room air Tolerating meals without nausea or vomiting Right groin remains sore  Objective:  Vital signs in last 24 hours:  Temp:  [98 F (36.7 C)-99 F (37.2 C)] 99 F (37.2 C) (03/09 0742) Pulse Rate:  [77-79] 78 (03/09 0742) Resp:  [18] 18 (03/09 0742) BP: (109-130)/(77-85) 109/77 (03/09 0742) SpO2:  [100 %] 100 % (03/09 0742)  Weight change:  Filed Weights   12/16/23 0915 12/18/23 0756 12/18/23 1218  Weight: 61.9 kg 60.8 kg 60.8 kg    Intake/Output: I/O last 3 completed shifts: In: 480 [P.O.:480] Out: -    Intake/Output this shift:  No intake/output data recorded.  Physical Exam: General: NAD  Head: Normocephalic, atraumatic. Moist oral mucosal membranes  Eyes: Anicteric  Lungs:  Clear to auscultation, normal effort  Heart: Regular rate and rhythm, aflutter  Abdomen:  Soft, tender, mild distension  Extremities: No peripheral edema.  Neurologic: Alert and oriented, moving all four extremities  Skin: No lesions  Access: Right chest tunneled catheter exchanged on 12/15/23    Basic Metabolic Panel: Recent Labs  Lab 12/14/23 0418 12/15/23 0354 12/16/23 0414 12/18/23 0813  NA 135 133* 133* 133*  K 3.7 3.5 3.9 3.5  CL 100 98 100 96*  CO2 22 24 23 26   GLUCOSE 94 90 126* 122*  BUN 27* 18 28* 26*  CREATININE 6.72* 4.48*  6.28* 5.90*  CALCIUM 8.1* 7.9* 8.2* 8.1*  PHOS  --   --  3.3 2.9    Liver Function Tests: Recent Labs  Lab 12/18/23 0813  ALBUMIN 2.0*    No results for input(s): "LIPASE", "AMYLASE" in the last 168 hours.  No results for input(s): "AMMONIA" in the last 168 hours.   CBC: Recent Labs  Lab 12/14/23 0418 12/15/23 0354 12/16/23 0414 12/18/23 0813  WBC 4.9 5.9 5.9 7.2  NEUTROABS 3.1 3.8 3.6  --   HGB 8.3* 8.2* 8.4* 7.7*  HCT 25.0* 24.5* 26.4* 24.0*  MCV 89.9 89.1 91.0 92.3  PLT 130* 139* 144* 145*    Cardiac Enzymes: No results for input(s): "CKTOTAL", "CKMB", "CKMBINDEX", "TROPONINI" in the last 168 hours.  BNP: Invalid input(s): "POCBNP"  CBG: Recent Labs  Lab 12/19/23 1110 12/19/23 1712 12/19/23 2025 12/20/23 0740 12/20/23 1201  GLUCAP 142* 139* 131* 91 149*    Microbiology: Results for orders placed or performed during the hospital encounter of 11/26/23  Culture, blood (Routine x 2)     Status: Abnormal   Collection Time: 11/26/23 11:22 AM   Specimen: BLOOD  Result Value Ref Range Status   Specimen Description   Final    BLOOD RIGHT ANTECUBITAL Performed at [facility], [address] [place], [place] [address]    Special Requests   Final    BOTTLES DRAWN AEROBIC AND ANAEROBIC Blood Culture results may not be optimal due to an inadequate volume of blood received in  culture bottles Performed at [facility], [address] [place], [place] [address]    Culture  Setup Time   Final    ANAEROBIC BOTTLE ONLY GRAM NEGATIVE RODS CRITICAL RESULT CALLED TO, READ BACK BY AND VERIFIED WITH: CAROLINE COULTER @ 11/28/23 2033 AB    Culture (A)  Final    BACTEROIDES THETAIOTAOMICRON BETA LACTAMASE POSITIVE Performed at [facility], [address], [place], [place] [address]    Report Status 12/01/2023 FINAL  Final  Blood Culture ID Panel (Reflexed)     Status: None   Collection Time: 11/26/23 11:22 AM  Result Value Ref Range  Status   Enterococcus faecalis NOT DETECTED NOT DETECTED Final   Enterococcus Faecium NOT DETECTED NOT DETECTED Final   Listeria monocytogenes NOT DETECTED NOT DETECTED Final   Staphylococcus species NOT DETECTED NOT DETECTED Final   Staphylococcus aureus (BCID) NOT DETECTED NOT DETECTED Final   Staphylococcus epidermidis NOT DETECTED NOT DETECTED Final   Staphylococcus lugdunensis NOT DETECTED NOT DETECTED Final   Streptococcus species NOT DETECTED NOT DETECTED Final   Streptococcus agalactiae NOT DETECTED NOT DETECTED Final   Streptococcus pneumoniae NOT DETECTED NOT DETECTED Final   Streptococcus pyogenes NOT DETECTED NOT DETECTED Final   A.calcoaceticus-baumannii NOT DETECTED NOT DETECTED Final   Bacteroides fragilis NOT DETECTED NOT DETECTED Final   Enterobacterales NOT DETECTED NOT DETECTED Final   Enterobacter cloacae complex NOT DETECTED NOT DETECTED Final   Escherichia coli NOT DETECTED NOT DETECTED Final   Klebsiella aerogenes NOT DETECTED NOT DETECTED Final   Klebsiella oxytoca NOT DETECTED NOT DETECTED Final   Klebsiella pneumoniae NOT DETECTED NOT DETECTED Final   Proteus species NOT DETECTED NOT DETECTED Final   Salmonella species NOT DETECTED NOT DETECTED Final   Serratia marcescens NOT DETECTED NOT DETECTED Final   Haemophilus influenzae NOT DETECTED NOT DETECTED Final   Neisseria meningitidis NOT DETECTED NOT DETECTED Final   Pseudomonas aeruginosa NOT DETECTED NOT DETECTED Final   Stenotrophomonas maltophilia NOT DETECTED NOT DETECTED Final   Candida albicans NOT DETECTED NOT DETECTED Final   Candida auris NOT DETECTED NOT DETECTED Final   Candida glabrata NOT DETECTED NOT DETECTED Final   Candida krusei NOT DETECTED NOT DETECTED Final   Candida parapsilosis NOT DETECTED NOT DETECTED Final   Candida tropicalis NOT DETECTED NOT DETECTED Final   Cryptococcus neoformans/gattii NOT DETECTED NOT DETECTED Final    Comment: Performed at [facility], [address] [place], [place] [address]  Culture, blood (Routine x 2)     Status: Abnormal   Collection Time: 11/26/23 12:23 PM   Specimen: BLOOD LEFT ARM  Result Value Ref Range Status   Specimen Description   Final    BLOOD LEFT ARM Performed at [facility], [address] [place], [place] [address]    Special Requests   Final    BOTTLES DRAWN AEROBIC AND ANAEROBIC Blood Culture results may not be optimal due to an inadequate volume of blood received in culture bottles Performed at [facility], [address] [place], [place] [address]    Culture  Setup Time   Final    GRAM POSITIVE COCCI AEROBIC BOTTLE ONLY CRITICAL RESULT CALLED TO, READ BACK BY AND VERIFIED WITH: BRIANA ALLEY ON 11/27/23 AT 1107 QSD    Culture (A)  Final    STAPHYLOCOCCUS EPIDERMIDIS THE SIGNIFICANCE OF ISOLATING THIS ORGANISM FROM A SINGLE SET OF BLOOD CULTURES WHEN MULTIPLE SETS ARE DRAWN IS UNCERTAIN. PLEASE NOTIFY THE  MICROBIOLOGY DEPARTMENT WITHIN ONE WEEK IF SPECIATION AND SENSITIVITIES ARE REQUIRED. Performed at [facility], [address] [place], [place] [address]    Report Status 11/29/2023 FINAL  Final  Blood Culture ID Panel (Reflexed)     Status: Abnormal   Collection Time: 11/26/23 12:23 PM  Result Value Ref Range Status   Enterococcus faecalis NOT DETECTED NOT DETECTED Final   Enterococcus Faecium NOT DETECTED NOT DETECTED Final   Listeria monocytogenes NOT DETECTED NOT DETECTED Final   Staphylococcus species DETECTED (A) NOT DETECTED Final    Comment: CRITICAL RESULT CALLED TO, READ BACK BY AND VERIFIED WITH: BRIANA ALLEY ON 11/27/23 AT 1107 QSD    Staphylococcus aureus (BCID) NOT DETECTED NOT DETECTED Final   Staphylococcus epidermidis DETECTED (A) NOT DETECTED Final    Comment: CRITICAL RESULT CALLED TO, READ BACK BY AND VERIFIED WITH: BRIANA ALLEY ON 11/27/23 AT 1107 QSD    Staphylococcus lugdunensis NOT DETECTED NOT DETECTED Final   Streptococcus species NOT DETECTED  NOT DETECTED Final   Streptococcus agalactiae NOT DETECTED NOT DETECTED Final   Streptococcus pneumoniae NOT DETECTED NOT DETECTED Final   Streptococcus pyogenes NOT DETECTED NOT DETECTED Final   A.calcoaceticus-baumannii NOT DETECTED NOT DETECTED Final   Bacteroides fragilis NOT DETECTED NOT DETECTED Final   Enterobacterales NOT DETECTED NOT DETECTED Final   Enterobacter cloacae complex NOT DETECTED NOT DETECTED Final   Escherichia coli NOT DETECTED NOT DETECTED Final   Klebsiella aerogenes NOT DETECTED NOT DETECTED Final   Klebsiella oxytoca NOT DETECTED NOT DETECTED Final   Klebsiella pneumoniae NOT DETECTED NOT DETECTED Final   Proteus species NOT DETECTED NOT DETECTED Final   Salmonella species NOT DETECTED NOT DETECTED Final   Serratia marcescens NOT DETECTED NOT DETECTED Final   Haemophilus influenzae NOT DETECTED NOT DETECTED Final   Neisseria meningitidis NOT DETECTED NOT DETECTED Final   Pseudomonas aeruginosa NOT DETECTED NOT DETECTED Final   Stenotrophomonas maltophilia NOT DETECTED NOT DETECTED Final   Candida albicans NOT DETECTED NOT DETECTED Final   Candida auris NOT DETECTED NOT DETECTED Final   Candida glabrata NOT DETECTED NOT DETECTED Final   Candida krusei NOT DETECTED NOT DETECTED Final   Candida parapsilosis NOT DETECTED NOT DETECTED Final   Candida tropicalis NOT DETECTED NOT DETECTED Final   Cryptococcus neoformans/gattii NOT DETECTED NOT DETECTED Final   Methicillin resistance mecA/C NOT DETECTED NOT DETECTED Final    Comment: Performed at [facility], [address] [place], [place] [address]  Body fluid culture w Gram Stain     Status: None   Collection Time: 11/26/23  8:55 PM   Specimen: PATH Cytology Peritoneal fluid  Result Value Ref Range Status   Specimen Description   Final    PERITONEAL CYTO Performed at [facility], [address], [place], [place] [address]    Special Requests   Final    NONE Performed at [facility], [address] [place], [place] [address]    Gram Stain   Final    FEW WBC PRESENT,BOTH PMN AND MONONUCLEAR FEW GRAM POSITIVE COCCI IN CHAINS MODERATE GRAM NEGATIVE RODS FEW GRAM POSITIVE RODS Performed at [facility], [address] [place], [place] [address]    Culture   Final    MODERATE STREPTOCOCCUS MITIS/ORALIS RARE CITROBACTER AMALONATICUS    Report Status 11/30/2023 FINAL  Final   Organism ID, Bacteria STREPTOCOCCUS MITIS/ORALIS  Final   Organism ID, Bacteria CITROBACTER AMALONATICUS  Final  Susceptibility   Citrobacter amalonaticus - MIC*    CEFEPIME <=0.12 SENSITIVE Sensitive     CEFTAZIDIME <=1 SENSITIVE Sensitive     CEFTRIAXONE <=0.25 SENSITIVE Sensitive     CIPROFLOXACIN <=0.25 SENSITIVE Sensitive     GENTAMICIN <=1 SENSITIVE Sensitive     IMIPENEM <=0.25 SENSITIVE Sensitive     TRIMETH/SULFA <=20 SENSITIVE Sensitive     PIP/TAZO <=4 SENSITIVE Sensitive ug/mL    * RARE CITROBACTER AMALONATICUS   Streptococcus mitis/oralis - MIC*    PENICILLIN 0.25 INTERMEDIATE Intermediate     CEFTRIAXONE 0.25 SENSITIVE Sensitive     LEVOFLOXACIN 1 SENSITIVE Sensitive     VANCOMYCIN 0.5 SENSITIVE Sensitive     * MODERATE STREPTOCOCCUS MITIS/ORALIS  Culture, blood (Routine X 2) w Reflex to ID Panel     Status: None   Collection Time: 11/28/23  5:43 AM   Specimen: BLOOD LEFT ARM  Result Value Ref Range Status   Specimen Description BLOOD LEFT ARM  Final   Special Requests   Final    BOTTLES DRAWN AEROBIC AND ANAEROBIC Blood Culture results may not be optimal due to an inadequate volume of blood received in culture bottles   Culture   Final    NO GROWTH 5 DAYS Performed at [facility], [address] [place], [place] [address]    Report Status 12/03/2023 FINAL  Final  Culture, blood (Routine X 2) w Reflex to ID Panel     Status: None   Collection Time: 11/28/23  5:47 AM   Specimen: BLOOD RIGHT HAND  Result Value Ref Range Status    Specimen Description BLOOD RIGHT HAND  Final   Special Requests   Final    BOTTLES DRAWN AEROBIC AND ANAEROBIC Blood Culture results may not be optimal due to an inadequate volume of blood received in culture bottles   Culture   Final    NO GROWTH 5 DAYS Performed at [facility], [address], [place], [place] [address]    Report Status 12/03/2023 FINAL  Final  Body fluid culture w Gram Stain     Status: None   Collection Time: 12/01/23 10:10 AM   Specimen: Ascitic; Body Fluid  Result Value Ref Range Status   Specimen Description   Final    ASCITIC Performed at [facility], [address] [place], [place] [address]    Special Requests   Final    peri Performed at [facility], [address] [place], [place] [address]    Gram Stain   Final    FEW WBC PRESENT, PREDOMINANTLY PMN ABUNDANT GRAM NEGATIVE RODS ABUNDANT GRAM POSITIVE COCCI RARE GRAM POSITIVE RODS    Culture   Final    FEW STREPTOCOCCUS MITIS/ORALIS RARE CITROBACTER AMALONATICUS MODERATE BACTEROIDES THETAIOTAOMICRON BETA LACTAMASE POSITIVE RARE EIKENELLA CORRODENS Usually susceptible to penicillin and other beta lactam agents,quinolones,macrolides and tetracyclines. Performed at [facility], [address] [place], [place] [address]    Report Status 12/06/2023 FINAL  Final   Organism ID, Bacteria STREPTOCOCCUS MITIS/ORALIS  Final   Organism ID, Bacteria CITROBACTER AMALONATICUS  Final      Susceptibility   Citrobacter amalonaticus - MIC*    CEFEPIME <=0.12 SENSITIVE Sensitive     CEFTAZIDIME <=1 SENSITIVE Sensitive     CEFTRIAXONE <=0.25 SENSITIVE Sensitive     CIPROFLOXACIN <=0.25 SENSITIVE Sensitive     GENTAMICIN <=1 SENSITIVE Sensitive     IMIPENEM <=0.25 SENSITIVE Sensitive     TRIMETH/SULFA <=20 SENSITIVE Sensitive  PIP/TAZO <=4 SENSITIVE Sensitive ug/mL    * RARE CITROBACTER AMALONATICUS   Streptococcus mitis/oralis - MIC*    PENICILLIN <=0.06 SENSITIVE  Sensitive     CEFTRIAXONE 0.25 SENSITIVE Sensitive     LEVOFLOXACIN 1 SENSITIVE Sensitive     VANCOMYCIN 0.5 SENSITIVE Sensitive     * FEW STREPTOCOCCUS MITIS/ORALIS    Coagulation Studies: No results for input(s): "LABPROT", "INR" in the last 72 hours.   Urinalysis: No results for input(s): "COLORURINE", "LABSPEC", "PHURINE", "GLUCOSEU", "HGBUR", "BILIRUBINUR", "KETONESUR", "PROTEINUR", "UROBILINOGEN", "NITRITE", "LEUKOCYTESUR" in the last 72 hours.  Invalid input(s): "APPERANCEUR"    Imaging: No results found.       Medications:       amLODipine  10 mg Oral Daily   apixaban  2.5 mg Oral BID   Chlorhexidine Gluconate Cloth  6 each Topical Q0600   ciprofloxacin  250 mg Oral QHS   epoetin alfa-epbx (RETACRIT) injection  10,000 Units Intravenous Q M,W,F-HD   feeding supplement  237 mL Oral TID BM   insulin aspart  0-6 Units Subcutaneous TID WC   irbesartan  300 mg Oral Daily   lactulose  20 g Oral BID   metoprolol tartrate  25 mg Oral BID   multivitamin  1 tablet Oral QHS   pantoprazole  40 mg Oral Daily   tamsulosin  0.4 mg Oral Daily   acetaminophen, alum & mag hydroxide-simeth, antiseptic oral rinse, hydrALAZINE, hydrOXYzine, ondansetron (ZOFRAN) IV  Assessment/ Plan:  Casey Franco is a 63 y.o.  male with HTN, ESRD, DM-2, Hep C.  Patient presents with chest pain and has been admitted for Intractable vomiting [R11.10] Abdominal pain [R10.9] Pneumonia of both lungs due to infectious organism, unspecified part of lung [J18.9] Sepsis without acute organ dysfunction, due to unspecified organism [facility]) [A41.9]  [facility]/MWF/right chest PermCath/71.0 kg  End-stage renal disease on hemodialysis.  Next treatment scheduled for Monday.   2. Anemia of chronic kidney disease Lab Results  Component Value Date   HGB 7.7 (L) 12/18/2023  Will obtain updated labs with dialysis tomorrow. Continue Epogen with dialysis treatments.  3. Secondary  Hyperparathyroidism: with outpatient labs: PTH 351, phosphorus 6.6, calcium 8.1 on 11/23/2023.   Lab Results  Component Value Date   PTH 31 08/30/2018   CALCIUM 8.1 (L) 12/18/2023   PHOS 2.9 12/18/2023    Will continue to monitor during this admission.  4.  Hypertension with chronic kidney disease.  Home regimen includes furosemide, hydralazine, amlodipine, and irbesartan. Amlodipine, Avapro, and metoprolol currently prescribed.  Blood pressure well-controlled on current medications.  5.  Cirrhosis/ascites, spontaneous bacterial peritonitis. Paracentesis from 11/27/2023 yielded 1.6 L of fluid.  Complex loculations suspected as more ascites present. Microbiology results show Staph epidermidis in the blood. Peritoneal fluid culture from 11/26/2023 shows Streptococcus and Citrobacter. Lactulose twice a day. Paracentesis on 12/02/23 with 4.6L removed. Fluid cultures negative  Patient remains on oral cipro.     LOS: 24 Tesia Lybrand 3/9/202512:17 PM

## 2023-12-20 NOTE — TOC Progression Note (Addendum)
 Transition of Care [facility]) - Progression Note    Patient Details  Name: Casey Franco MRN: [id number] Date of Birth: 12-19-1960  [facility]) CM/SW Contact  Rodney Langton, RN Phone Number: 12/20/2023, 9:57 AM  Clinical Narrative:     Call placed to sister to follow up on process of appeal, no answer, voice message left. Patient state his sister did start the process, but he is unsure where it stands.  He is still waiting on update from her and the insurance company.        Expected Discharge Plan and Services         Expected Discharge Date: 12/15/23                                     Social Determinants of Health (SDOH) Interventions SDOH Screenings   Food Insecurity: Patient Declined (11/28/2023)  Housing: Low Risk  (12/06/2023)  Transportation Needs: Patient Declined (11/28/2023)  Utilities: Not At Risk (11/28/2023)  Financial Resource Strain: Low Risk  (05/15/2020)   Received from [facility], [facility]  Tobacco Use: Medium Risk (11/26/2023)    Readmission Risk Interventions     No data to display

## 2023-12-20 NOTE — Progress Notes (Signed)
 Progress Note   Patient: Casey Franco:[id number] DOB: 1961/05/01 DOA: 11/26/2023     24 DOS: the patient was seen and examined on 12/20/2023   Brief hospital course: Casey Franco is a 63 y.o. 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  He also complained of nausea, increasing abdominal distention and multiple episodes of vomiting.  Patient is diagnosed with spontaneous bacterial peritonitis, he has completed 2 weeks of antibiotics, currently on prophylactic Cipro. Patient also had a paracentesis on 2/18 with removal of 4.6 L of fluids. Patient condition has improved, patient parents has denied the patient nursing home placement, patient decided to appeal for the discharge.   Principal Problem:   Spontaneous bacterial peritonitis (HCC) Active Problems:   Abdominal pain   Cirrhosis of liver with ascites (HCC)   Myocardial injury   Essential hypertension   ESRD (end stage renal disease) (HCC)   Coffee ground emesis   Paroxysmal atrial fibrillation (HCC)   Sepsis without acute organ dysfunction (HCC)   Thrombocytopenia (HCC)   Pressure injury of skin   Assessment and Plan: Spontaneous bacterial peritonitis Abdominal pain Liver cirrhosis with ascites: S/p paracentesis with removal of 1600 mL of fluid on 11/26/2023.   Fluid positive for SBP (ascitic fluid total nucleated cells 1,326, percentage neutrophils was 68% and absolute PMN count 901) Ascitic fluid culture showed Streptococcus mitis and Citrobacter amalonaticus S/p repeat paracentesis on 12/01/2023 with removal of 4.6 L of fluid. Percentage neutrophil count was 100 and total nucleated cell count 4,687. He was evaluated by the general surgeon.  No indication for surgery at this time.   Has completed 2 weeks of antibiotics for  SBP Continue on SBP prophylaxis with ciprofloxacin Patient current condition stable, abdominal distention at baseline, no need for repeat paracentesis.     Bacteroides  thetaiotaomicron bacteremia (blood cultures from 11/26/2023 growing gram-negative rods).   Has completed 2 weeks of metronidazole as recommended by infectious disease on 12/14/2023 Blood culture also positive for staph epi which he appeared to be a contaminant.     Hematemesis: Resolved.  This is probably from multiple episodes of vomiting.   H&H is stable.   Severe protein calorie malnutrition. Patient has significant muscle atrophy, continue protein supplement.   Elevated troponins: Troponin is 172, 138, 127 and 128.  No chest pain or acute EKG changes. Due to demand ischemia.     Paroxysmal atrial fibrillation, PVCs: Heart rate is better.  Continue metoprolol.  We discussed risks, benefits and alternatives to long-term anticoagulation for atrial fibrillation. He is at increased risk for bleeding from long-term anticoagulation because of underlying severe liver and kidney disease. CHA2DS2-VASc score is 3 and he is also at increased risk for stroke. Patient has agreed to Eliquis Eliquis initiated as hemoglobin is stabilized   Chronic systolic CHF Moderate mitral regurgitation 2D echo showed EF estimated at 40 to 45%, moderate LVH, indeterminate left ventricular diastolic parameters, mildly reduced RV systolic function, moderately enlarged RV size, moderately dilated right atrium, small pericardial effusion, moderate mitral regurgitation --Volume management by dialysis   Confusion on 11/30/2023, likely acute toxic encephalopathy from oxycodone: Oxycodone has been discontinued. Continue delirium precaution     ESRD on hemodialysis MWF Hyponatremia. --Nephrology following for hemodialysis    Dysphagia: Speech therapist recommended dysphagia 2 diet, thin liquids.     General weakness: PT  recommended discharge to  SNF.   Follow-up with TOC to assist with placement.     Essential hypertension Continue current antihypertensives   type II DM   Glucose not significant elevated.   Pressure ulcer: POA Pressure Injury 12/02/23 Sacrum Mid Stage 1 -  Intact skin with non-blanchable redness of a localized area usually over a bony prominence. (Active)  12/02/23 1500  Location: Sacrum  Location Orientation: Mid  Staging: Stage 1 -  Intact skin with non-blanchable redness of a localized area usually over a bony prominence.  Wound Description (Comments):   Present on Admission: No       Patient does not have any complaint today, family started appearing process for nursing home placement.  Will wait until the process is complete.   Subjective:  Patient doing well, no complaint.  Physical Exam: Vitals:   12/19/23 1440 12/19/23 2010 12/20/23 0350 12/20/23 0742  BP: 119/81 128/83 130/85 109/77  Pulse: 79 79 77 78  Resp: 18 18  18   Temp: 98 F (36.7 C) 98 F (36.7 C) 98.2 F (36.8 C) 99 F (37.2 C)  TempSrc:      SpO2: 100% 100% 100% 100%  Weight:      Height:       General exam: Appears calm and comfortable, severely malnourished. Respiratory system: Clear to auscultation. Respiratory effort normal. Cardiovascular system: S1 & S2 heard, RRR. No JVD, murmurs, rubs, gallops or clicks. No pedal edema. Gastrointestinal system: Abdomen is mildly distended, soft and nontender. No organomegaly or masses felt. Normal bowel sounds heard. Central nervous system: Alert and oriented. No focal neurological deficits. Extremities: Atrophy. Skin: No rashes, lesions or ulcers Psychiatry: Judgement and insight appear normal. Mood & affect appropriate.    Data Reviewed:  No new labs.  Family Communication: None  Disposition: Status is: Inpatient Remains inpatient appropriate because: Unsafe discharge option.     Time spent: 35 minutes  Author: Marrion Coy, MD 12/20/2023 10:15 AM  For on  call review [email or web address].

## 2023-12-21 DIAGNOSIS — N186 End stage renal disease: Secondary | ICD-10-CM | POA: Diagnosis not present

## 2023-12-21 DIAGNOSIS — K652 Spontaneous bacterial peritonitis: Secondary | ICD-10-CM | POA: Diagnosis not present

## 2023-12-21 DIAGNOSIS — R188 Other ascites: Secondary | ICD-10-CM | POA: Diagnosis not present

## 2023-12-21 DIAGNOSIS — K7460 Unspecified cirrhosis of liver: Secondary | ICD-10-CM | POA: Diagnosis not present

## 2023-12-21 LAB — RENAL FUNCTION PANEL
Albumin: 2 g/dL — ABNORMAL LOW (ref 3.5–5.0)
Anion gap: 11 (ref 5–15)
BUN: 36 mg/dL — ABNORMAL HIGH (ref 8–23)
CO2: 23 mmol/L (ref 22–32)
Calcium: 8.5 mg/dL — ABNORMAL LOW (ref 8.9–10.3)
Chloride: 99 mmol/L (ref 98–111)
Creatinine, Ser: 7.02 mg/dL — ABNORMAL HIGH (ref 0.61–1.24)
GFR, Estimated: 8 mL/min — ABNORMAL LOW (ref >60)
Glucose, Bld: 104 mg/dL — ABNORMAL HIGH (ref 70–99)
Phosphorus: 3.9 mg/dL (ref 2.5–4.6)
Potassium: 3.7 mmol/L (ref 3.5–5.1)
Sodium: 133 mmol/L — ABNORMAL LOW (ref 135–145)

## 2023-12-21 LAB — GLUCOSE, CAPILLARY
Glucose-Capillary: 101 mg/dL — ABNORMAL HIGH (ref 70–99)
Glucose-Capillary: 87 mg/dL (ref 70–99)
Glucose-Capillary: 113 mg/dL — ABNORMAL HIGH (ref 70–99)
Glucose-Capillary: 126 mg/dL — ABNORMAL HIGH (ref 70–99)

## 2023-12-21 LAB — CBC
HCT: 22.9 % — ABNORMAL LOW (ref 39.0–52.0)
Hemoglobin: 7.4 g/dL — ABNORMAL LOW (ref 13.0–17.0)
MCH: 29.2 pg (ref 26.0–34.0)
MCHC: 32.3 g/dL (ref 30.0–36.0)
MCV: 90.5 fL (ref 80.0–100.0)
Platelets: 206 10*3/uL (ref 150–400)
RBC: 2.53 MIL/uL — ABNORMAL LOW (ref 4.22–5.81)
RDW: 16.8 % — ABNORMAL HIGH (ref 11.5–15.5)
WBC: 8.7 10*3/uL (ref 4.0–10.5)
nRBC: 0 % (ref 0.0–0.2)

## 2023-12-21 MED ORDER — LACTULOSE 10 GM/15ML PO SOLN
20.0000 g | Freq: Every day | ORAL | Status: DC
  Administered 2023-12-22 – 2023-12-23 (×2): 20 g via ORAL
  Filled 2023-12-21 (×2): qty 30

## 2023-12-21 MED ORDER — EPOETIN ALFA-EPBX 10000 UNIT/ML IJ SOLN
INTRAMUSCULAR | Status: AC
  Filled 2023-12-21: qty 1

## 2023-12-21 MED ORDER — HEPARIN SODIUM (PORCINE) 1000 UNIT/ML IJ SOLN
INTRAMUSCULAR | Status: AC
  Filled 2023-12-21: qty 10

## 2023-12-21 NOTE — Plan of Care (Signed)
  Problem: Pain Managment: Goal: General experience of comfort will improve and/or be controlled Outcome: Progressing   Problem: Safety: Goal: Ability to remain free from injury will improve Outcome: Progressing   Problem: Skin Integrity: Goal: Risk for impaired skin integrity will decrease Outcome: Progressing

## 2023-12-21 NOTE — Progress Notes (Signed)
 [facility]  ROUNDING NOTE   Subjective:   Casey Franco is a 63 y.o. male with past medical history of HTN, ESRD, DM-2, and Hep C. Patient presents to emergency department with abdominal pain. He has been admitted for Intractable vomiting [R11.10] Abdominal pain [R10.9] Pneumonia of both lungs due to infectious organism, unspecified part of lung [J18.9] Sepsis without acute organ dysfunction, due to unspecified organism [facility]) [A41.9]  Patient is known to our practice and receives outpatient dialysis at [facility] on a MWF.  Update  Patient seen and evaluated during dialysis   HEMODIALYSIS FLOWSHEET:  Blood Flow Rate (mL/min): 400 mL/min Arterial Pressure (mmHg): -167.26 mmHg Venous Pressure (mmHg): 138.58 mmHg TMP (mmHg): 0.61 mmHg Ultrafiltration Rate (mL/min): 257 mL/min Dialysate Flow Rate (mL/min): 299 ml/min  Resting comfortably  Objective:  Vital signs in last 24 hours:  Temp:  [98.1 F (36.7 C)-99.3 F (37.4 C)] 98.7 F (37.1 C) (03/10 0753) Pulse Rate:  [66-79] 77 (03/10 1100) Resp:  [15-29] 15 (03/10 1030) BP: (110-145)/(76-85) 122/77 (03/10 1100) SpO2:  [94 %-100 %] 100 % (03/10 1100) Weight:  [63 kg] 63 kg (03/10 0753)  Weight change:  Filed Weights   12/18/23 0756 12/18/23 1218 12/21/23 0753  Weight: 60.8 kg 60.8 kg 63 kg    Intake/Output: I/O last 3 completed shifts: In: 600 [P.O.:600] Out: -    Intake/Output this shift:  No intake/output data recorded.  Physical Exam: General: NAD  Head: Normocephalic, atraumatic. Moist oral mucosal membranes  Eyes: Anicteric  Lungs:  Clear to auscultation, normal effort  Heart: Regular rate and rhythm, aflutter  Abdomen:  Soft, tender, mild distension  Extremities: No peripheral edema.  Neurologic: Alert and oriented, moving all four extremities  Skin: No lesions  Access: Right chest tunneled catheter exchanged on 12/15/23    Basic Metabolic Panel: Recent Labs  Lab  12/15/23 0354 12/16/23 0414 12/18/23 0813 12/21/23 0831  NA 133* 133* 133* 133*  K 3.5 3.9 3.5 3.7  CL 98 100 96* 99  CO2 24 23 26 23   GLUCOSE 90 126* 122* 104*  BUN 18 28* 26* 36*  CREATININE 4.48* 6.28* 5.90* 7.02*  CALCIUM 7.9* 8.2* 8.1* 8.5*  PHOS  --  3.3 2.9 3.9    Liver Function Tests: Recent Labs  Lab 12/18/23 0813 12/21/23 0831  ALBUMIN 2.0* 2.0*    No results for input(s): "LIPASE", "AMYLASE" in the last 168 hours.  No results for input(s): "AMMONIA" in the last 168 hours.   CBC: Recent Labs  Lab 12/15/23 0354 12/16/23 0414 12/18/23 0813 12/21/23 0416  WBC 5.9 5.9 7.2 8.7  NEUTROABS 3.8 3.6  --   --   HGB 8.2* 8.4* 7.7* 7.4*  HCT 24.5* 26.4* 24.0* 22.9*  MCV 89.1 91.0 92.3 90.5  PLT 139* 144* 145* 206    Cardiac Enzymes: No results for input(s): "CKTOTAL", "CKMB", "CKMBINDEX", "TROPONINI" in the last 168 hours.  BNP: Invalid input(s): "POCBNP"  CBG: Recent Labs  Lab 12/20/23 0740 12/20/23 1201 12/20/23 1709 12/20/23 2202 12/21/23 0733  GLUCAP 91 149* 128* 132* 101*    Microbiology: Results for orders placed or performed during the hospital encounter of 11/26/23  Culture, blood (Routine x 2)     Status: Abnormal   Collection Time: 11/26/23 11:22 AM   Specimen: BLOOD  Result Value Ref Range Status   Specimen Description   Final    BLOOD RIGHT ANTECUBITAL Performed at [facility], [address], [place], [place] [address]  Special Requests   Final    BOTTLES DRAWN AEROBIC AND ANAEROBIC Blood Culture results may not be optimal due to an inadequate volume of blood received in culture bottles Performed at [facility], [address] [place], [place] [address]    Culture  Setup Time   Final    ANAEROBIC BOTTLE ONLY GRAM NEGATIVE RODS CRITICAL RESULT CALLED TO, READ BACK BY AND VERIFIED WITH: CAROLINE COULTER @ 11/28/23 2033 AB    Culture (A)  Final    BACTEROIDES THETAIOTAOMICRON BETA LACTAMASE  POSITIVE Performed at [facility], [address], [place], [place] [address]    Report Status 12/01/2023 FINAL  Final  Blood Culture ID Panel (Reflexed)     Status: None   Collection Time: 11/26/23 11:22 AM  Result Value Ref Range Status   Enterococcus faecalis NOT DETECTED NOT DETECTED Final   Enterococcus Faecium NOT DETECTED NOT DETECTED Final   Listeria monocytogenes NOT DETECTED NOT DETECTED Final   Staphylococcus species NOT DETECTED NOT DETECTED Final   Staphylococcus aureus (BCID) NOT DETECTED NOT DETECTED Final   Staphylococcus epidermidis NOT DETECTED NOT DETECTED Final   Staphylococcus lugdunensis NOT DETECTED NOT DETECTED Final   Streptococcus species NOT DETECTED NOT DETECTED Final   Streptococcus agalactiae NOT DETECTED NOT DETECTED Final   Streptococcus pneumoniae NOT DETECTED NOT DETECTED Final   Streptococcus pyogenes NOT DETECTED NOT DETECTED Final   A.calcoaceticus-baumannii NOT DETECTED NOT DETECTED Final   Bacteroides fragilis NOT DETECTED NOT DETECTED Final   Enterobacterales NOT DETECTED NOT DETECTED Final   Enterobacter cloacae complex NOT DETECTED NOT DETECTED Final   Escherichia coli NOT DETECTED NOT DETECTED Final   Klebsiella aerogenes NOT DETECTED NOT DETECTED Final   Klebsiella oxytoca NOT DETECTED NOT DETECTED Final   Klebsiella pneumoniae NOT DETECTED NOT DETECTED Final   Proteus species NOT DETECTED NOT DETECTED Final   Salmonella species NOT DETECTED NOT DETECTED Final   Serratia marcescens NOT DETECTED NOT DETECTED Final   Haemophilus influenzae NOT DETECTED NOT DETECTED Final   Neisseria meningitidis NOT DETECTED NOT DETECTED Final   Pseudomonas aeruginosa NOT DETECTED NOT DETECTED Final   Stenotrophomonas maltophilia NOT DETECTED NOT DETECTED Final   Candida albicans NOT DETECTED NOT DETECTED Final   Candida auris NOT DETECTED NOT DETECTED Final   Candida glabrata NOT DETECTED NOT DETECTED Final   Candida krusei NOT DETECTED NOT  DETECTED Final   Candida parapsilosis NOT DETECTED NOT DETECTED Final   Candida tropicalis NOT DETECTED NOT DETECTED Final   Cryptococcus neoformans/gattii NOT DETECTED NOT DETECTED Final    Comment: Performed at [facility], [address] [place], [place] [address]  Culture, blood (Routine x 2)     Status: Abnormal   Collection Time: 11/26/23 12:23 PM   Specimen: BLOOD LEFT ARM  Result Value Ref Range Status   Specimen Description   Final    BLOOD LEFT ARM Performed at [facility], [address] [place], [place] [address]    Special Requests   Final    BOTTLES DRAWN AEROBIC AND ANAEROBIC Blood Culture results may not be optimal due to an inadequate volume of blood received in culture bottles Performed at [facility], [address] [place], [place] [address]    Culture  Setup Time   Final    GRAM POSITIVE COCCI AEROBIC BOTTLE ONLY CRITICAL RESULT CALLED TO, READ BACK BY AND VERIFIED WITH: BRIANA ALLEY ON 11/27/23 AT 1107 QSD    Culture (A)  Final    STAPHYLOCOCCUS EPIDERMIDIS THE SIGNIFICANCE OF ISOLATING THIS ORGANISM FROM A SINGLE SET OF BLOOD CULTURES WHEN MULTIPLE SETS ARE DRAWN IS UNCERTAIN. PLEASE NOTIFY THE MICROBIOLOGY DEPARTMENT WITHIN ONE WEEK IF SPECIATION AND SENSITIVITIES ARE REQUIRED. Performed at [facility], [address] [place], [place] [address]    Report Status 11/29/2023 FINAL  Final  Blood Culture ID Panel (Reflexed)     Status: Abnormal   Collection Time: 11/26/23 12:23 PM  Result Value Ref Range Status   Enterococcus faecalis NOT DETECTED NOT DETECTED Final   Enterococcus Faecium NOT DETECTED NOT DETECTED Final   Listeria monocytogenes NOT DETECTED NOT DETECTED Final   Staphylococcus species DETECTED (A) NOT DETECTED Final    Comment: CRITICAL RESULT CALLED TO, READ BACK BY AND VERIFIED WITH: BRIANA ALLEY ON 11/27/23 AT 1107 QSD    Staphylococcus aureus (BCID) NOT DETECTED NOT DETECTED Final   Staphylococcus  epidermidis DETECTED (A) NOT DETECTED Final    Comment: CRITICAL RESULT CALLED TO, READ BACK BY AND VERIFIED WITH: BRIANA ALLEY ON 11/27/23 AT 1107 QSD    Staphylococcus lugdunensis NOT DETECTED NOT DETECTED Final   Streptococcus species NOT DETECTED NOT DETECTED Final   Streptococcus agalactiae NOT DETECTED NOT DETECTED Final   Streptococcus pneumoniae NOT DETECTED NOT DETECTED Final   Streptococcus pyogenes NOT DETECTED NOT DETECTED Final   A.calcoaceticus-baumannii NOT DETECTED NOT DETECTED Final   Bacteroides fragilis NOT DETECTED NOT DETECTED Final   Enterobacterales NOT DETECTED NOT DETECTED Final   Enterobacter cloacae complex NOT DETECTED NOT DETECTED Final   Escherichia coli NOT DETECTED NOT DETECTED Final   Klebsiella aerogenes NOT DETECTED NOT DETECTED Final   Klebsiella oxytoca NOT DETECTED NOT DETECTED Final   Klebsiella pneumoniae NOT DETECTED NOT DETECTED Final   Proteus species NOT DETECTED NOT DETECTED Final   Salmonella species NOT DETECTED NOT DETECTED Final   Serratia marcescens NOT DETECTED NOT DETECTED Final   Haemophilus influenzae NOT DETECTED NOT DETECTED Final   Neisseria meningitidis NOT DETECTED NOT DETECTED Final   Pseudomonas aeruginosa NOT DETECTED NOT DETECTED Final   Stenotrophomonas maltophilia NOT DETECTED NOT DETECTED Final   Candida albicans NOT DETECTED NOT DETECTED Final   Candida auris NOT DETECTED NOT DETECTED Final   Candida glabrata NOT DETECTED NOT DETECTED Final   Candida krusei NOT DETECTED NOT DETECTED Final   Candida parapsilosis NOT DETECTED NOT DETECTED Final   Candida tropicalis NOT DETECTED NOT DETECTED Final   Cryptococcus neoformans/gattii NOT DETECTED NOT DETECTED Final   Methicillin resistance mecA/C NOT DETECTED NOT DETECTED Final    Comment: Performed at [facility], [address] [place], [place] [address]  Body fluid culture w Gram Stain     Status: None   Collection Time: 11/26/23  8:55 PM   Specimen: PATH  Cytology Peritoneal fluid  Result Value Ref Range Status   Specimen Description   Final    PERITONEAL CYTO Performed at [facility], [address] [place], [place] [address]    Special Requests   Final    NONE Performed at [facility], [address] [place], [place] [address]    Gram Stain   Final    FEW WBC PRESENT,BOTH PMN AND MONONUCLEAR FEW GRAM POSITIVE COCCI IN CHAINS MODERATE GRAM NEGATIVE RODS FEW GRAM POSITIVE RODS Performed at [facility], [address] [place], [place] [address]    Culture   Final    MODERATE STREPTOCOCCUS MITIS/ORALIS RARE CITROBACTER AMALONATICUS  Report Status 11/30/2023 FINAL  Final   Organism ID, Bacteria STREPTOCOCCUS MITIS/ORALIS  Final   Organism ID, Bacteria CITROBACTER AMALONATICUS  Final      Susceptibility   Citrobacter amalonaticus - MIC*    CEFEPIME <=0.12 SENSITIVE Sensitive     CEFTAZIDIME <=1 SENSITIVE Sensitive     CEFTRIAXONE <=0.25 SENSITIVE Sensitive     CIPROFLOXACIN <=0.25 SENSITIVE Sensitive     GENTAMICIN <=1 SENSITIVE Sensitive     IMIPENEM <=0.25 SENSITIVE Sensitive     TRIMETH/SULFA <=20 SENSITIVE Sensitive     PIP/TAZO <=4 SENSITIVE Sensitive ug/mL    * RARE CITROBACTER AMALONATICUS   Streptococcus mitis/oralis - MIC*    PENICILLIN 0.25 INTERMEDIATE Intermediate     CEFTRIAXONE 0.25 SENSITIVE Sensitive     LEVOFLOXACIN 1 SENSITIVE Sensitive     VANCOMYCIN 0.5 SENSITIVE Sensitive     * MODERATE STREPTOCOCCUS MITIS/ORALIS  Culture, blood (Routine X 2) w Reflex to ID Panel     Status: None   Collection Time: 11/28/23  5:43 AM   Specimen: BLOOD LEFT ARM  Result Value Ref Range Status   Specimen Description BLOOD LEFT ARM  Final   Special Requests   Final    BOTTLES DRAWN AEROBIC AND ANAEROBIC Blood Culture results may not be optimal due to an inadequate volume of blood received in culture bottles   Culture   Final    NO GROWTH 5 DAYS Performed at [facility], [address] [place], [place] [address]    Report Status 12/03/2023 FINAL  Final  Culture, blood (Routine X 2) w Reflex to ID Panel     Status: None   Collection Time: 11/28/23  5:47 AM   Specimen: BLOOD RIGHT HAND  Result Value Ref Range Status   Specimen Description BLOOD RIGHT HAND  Final   Special Requests   Final    BOTTLES DRAWN AEROBIC AND ANAEROBIC Blood Culture results may not be optimal due to an inadequate volume of blood received in culture bottles   Culture   Final    NO GROWTH 5 DAYS Performed at [facility], [address], [place], [place] [address]    Report Status 12/03/2023 FINAL  Final  Body fluid culture w Gram Stain     Status: None   Collection Time: 12/01/23 10:10 AM   Specimen: Ascitic; Body Fluid  Result Value Ref Range Status   Specimen Description   Final    ASCITIC Performed at [facility], [address] [place], [place] [address]    Special Requests   Final    peri Performed at [facility], [address] [place], [place] [address]    Gram Stain   Final    FEW WBC PRESENT, PREDOMINANTLY PMN ABUNDANT GRAM NEGATIVE RODS ABUNDANT GRAM POSITIVE COCCI RARE GRAM POSITIVE RODS    Culture   Final    FEW STREPTOCOCCUS MITIS/ORALIS RARE CITROBACTER AMALONATICUS MODERATE BACTEROIDES THETAIOTAOMICRON BETA LACTAMASE POSITIVE RARE EIKENELLA CORRODENS Usually susceptible to penicillin and other beta lactam agents,quinolones,macrolides and tetracyclines. Performed at [facility], [address], [place], [place] [address]    Report Status 12/06/2023 FINAL  Final   Organism ID, Bacteria STREPTOCOCCUS MITIS/ORALIS  Final   Organism ID, Bacteria CITROBACTER AMALONATICUS  Final      Susceptibility   Citrobacter amalonaticus - MIC*    CEFEPIME <=0.12 SENSITIVE Sensitive     CEFTAZIDIME <=1 SENSITIVE Sensitive     CEFTRIAXONE <=0.25 SENSITIVE Sensitive  CIPROFLOXACIN <=0.25 SENSITIVE Sensitive     GENTAMICIN <=1  SENSITIVE Sensitive     IMIPENEM <=0.25 SENSITIVE Sensitive     TRIMETH/SULFA <=20 SENSITIVE Sensitive     PIP/TAZO <=4 SENSITIVE Sensitive ug/mL    * RARE CITROBACTER AMALONATICUS   Streptococcus mitis/oralis - MIC*    PENICILLIN <=0.06 SENSITIVE Sensitive     CEFTRIAXONE 0.25 SENSITIVE Sensitive     LEVOFLOXACIN 1 SENSITIVE Sensitive     VANCOMYCIN 0.5 SENSITIVE Sensitive     * FEW STREPTOCOCCUS MITIS/ORALIS    Coagulation Studies: No results for input(s): "LABPROT", "INR" in the last 72 hours.   Urinalysis: No results for input(s): "COLORURINE", "LABSPEC", "PHURINE", "GLUCOSEU", "HGBUR", "BILIRUBINUR", "KETONESUR", "PROTEINUR", "UROBILINOGEN", "NITRITE", "LEUKOCYTESUR" in the last 72 hours.  Invalid input(s): "APPERANCEUR"    Imaging: No results found.       Medications:       amLODipine  10 mg Oral Daily   apixaban  2.5 mg Oral BID   Chlorhexidine Gluconate Cloth  6 each Topical Q0600   ciprofloxacin  250 mg Oral QHS   epoetin alfa-epbx (RETACRIT) injection  10,000 Units Intravenous Q M,W,F-HD   feeding supplement  237 mL Oral TID BM   insulin aspart  0-6 Units Subcutaneous TID WC   irbesartan  300 mg Oral Daily   lactulose  20 g Oral BID   metoprolol tartrate  25 mg Oral BID   multivitamin  1 tablet Oral QHS   pantoprazole  40 mg Oral Daily   tamsulosin  0.4 mg Oral Daily   acetaminophen, alum & mag hydroxide-simeth, antiseptic oral rinse, hydrALAZINE, hydrOXYzine, ondansetron (ZOFRAN) IV  Assessment/ Plan:  Casey Franco is a 63 y.o.  male with HTN, ESRD, DM-2, Hep C.  Patient presents with chest pain and has been admitted for Intractable vomiting [R11.10] Abdominal pain [R10.9] Pneumonia of both lungs due to infectious organism, unspecified part of lung [J18.9] Sepsis without acute organ dysfunction, due to unspecified organism [facility]) [A41.9]  [facility]/MWF/right chest PermCath/71.0 kg  End-stage renal disease on hemodialysis.   Receiving dialysis today, no UF. Next treatment scheduled for Wednesday.   2. Anemia of chronic kidney disease Lab Results  Component Value Date   HGB 7.4 (L) 12/21/2023  Hgb remains decreased.  Continue Epogen with dialysis treatments.  3. Secondary Hyperparathyroidism: with outpatient labs: PTH 351, phosphorus 6.6, calcium 8.1 on 11/23/2023.   Lab Results  Component Value Date   PTH 31 08/30/2018   CALCIUM 8.5 (L) 12/21/2023   PHOS 3.9 12/21/2023    Will continue to monitor during this admission.  4.  Hypertension with chronic kidney disease.  Home regimen includes furosemide, hydralazine, amlodipine, and irbesartan. Amlodipine, Avapro, and metoprolol currently prescribed.  Blood pressure 123/76 during dialysis  5.  Cirrhosis/ascites, spontaneous bacterial peritonitis. Paracentesis from 11/27/2023 yielded 1.6 L of fluid.  Complex loculations suspected as more ascites present. Microbiology results show Staph epidermidis in the blood. Peritoneal fluid culture from 11/26/2023 shows Streptococcus and Citrobacter. Lactulose twice a day. Paracentesis on 12/02/23 with 4.6L removed. Fluid cultures negative  Treating with oral cipro.     LOS: 25 Kaegan Stigler 3/10/202511:27 AM

## 2023-12-21 NOTE — Progress Notes (Signed)
 Hemodialysis Note:  Received patient in bed to unit. Alert and oriented. Informed consent singed and in chart.  Treatment initiated: 0806 Treatment completed: 1137  Access used: Right internal jugular catheter Access issues: None  Patient tolerated well. Transported back to room, alert without acute distress. Report given to patient's RN.  Total UF removed: 0 Medications given: Retacrit 16109 units IV Post HD weight: 63 Kg  [facility]

## 2023-12-21 NOTE — Progress Notes (Signed)
 Progress Note   Patient: Casey Franco GNF:[id number] DOB: [birth year] DOA: 11/26/2023     25 DOS: the patient was seen and examined on 12/21/2023   Brief hospital course: DAVE MERGEN is a 63 y.o. 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  He also complained of nausea, increasing abdominal distention and multiple episodes of vomiting.  Patient is diagnosed with spontaneous bacterial peritonitis, he has completed 2 weeks of antibiotics, currently on prophylactic Cipro. Patient also had a paracentesis on 2/18 with removal of 4.6 L of fluids. Patient condition has improved, patient parents has denied the patient nursing home placement, patient decided to appeal for the discharge.   Principal Problem:   Spontaneous bacterial peritonitis (HCC) Active Problems:   Abdominal pain   Cirrhosis of liver with ascites (HCC)   Myocardial injury   Essential hypertension   ESRD (end stage renal disease) (HCC)   Coffee ground emesis   Paroxysmal atrial fibrillation (HCC)   Sepsis without acute organ dysfunction (HCC)   Thrombocytopenia (HCC)   Pressure injury of skin   Assessment and Plan:  Spontaneous bacterial peritonitis Abdominal pain Liver cirrhosis with ascites: S/p paracentesis with removal of 1600 mL of fluid on 11/26/2023.   Fluid positive for SBP (ascitic fluid total nucleated cells 1,326, percentage neutrophils was 68% and absolute PMN count 901) Ascitic fluid culture showed Streptococcus mitis and Citrobacter amalonaticus S/p repeat paracentesis on 12/01/2023 with removal of 4.6 L of fluid. Percentage neutrophil count was 100 and total nucleated cell count 4,687. He was evaluated by the general surgeon.  No indication for surgery at this time.   Has completed 2 weeks of antibiotics for  SBP Continue on SBP prophylaxis with ciprofloxacin Patient current condition stable, abdominal distention at baseline, no need for repeat paracentesis. Some left upper quadrant abdominal pain last night, examination did not show any evidence of peritonitis.  No tenderness today, no rebound tenderness.  Continue Protonix.     Bacteroides  thetaiotaomicron bacteremia (blood cultures from 11/26/2023 growing gram-negative rods).   Has completed 2 weeks of metronidazole as recommended by infectious disease on 12/14/2023 Blood culture also positive for staph epi which he appeared to be a contaminant.     Hematemesis: Resolved.  This is probably from multiple episodes of vomiting.   H&H is stable.   Severe protein calorie malnutrition. Patient has significant muscle atrophy, continue protein supplement.   Elevated troponins: Troponin is 172, 138, 127 and 128.  No chest pain or acute EKG changes. Due to demand ischemia.     Paroxysmal atrial fibrillation, PVCs: Heart rate is better.  Continue metoprolol.  We discussed risks, benefits and alternatives to long-term anticoagulation for atrial fibrillation. He is at increased risk for bleeding from long-term anticoagulation because of underlying severe liver and kidney disease. CHA2DS2-VASc score is 3 and he is also at increased risk for stroke. Patient has agreed to Eliquis Eliquis initiated as hemoglobin is stabilized   Chronic systolic CHF Moderate mitral regurgitation 2D echo showed EF estimated at 40 to 45%, moderate LVH, indeterminate left ventricular diastolic parameters, mildly reduced RV systolic function, moderately enlarged RV size, moderately dilated right atrium, small pericardial effusion, moderate mitral regurgitation --Volume management by dialysis   Confusion on 11/30/2023, likely acute toxic encephalopathy from oxycodone: Oxycodone has been discontinued. Continue delirium precaution     ESRD  on hemodialysis  MWF Hyponatremia. --Nephrology following for hemodialysis    Dysphagia: Speech therapist recommended dysphagia 2 diet, thin liquids.     General weakness: PT recommended discharge to SNF.   Follow-up with TOC to assist with placement.     Essential hypertension Continue current antihypertensives   type II DM   Glucose not significant elevated.   Pressure ulcer: POA Pressure Injury 12/02/23 Sacrum Mid Stage 1 -  Intact skin with non-blanchable redness of a localized area usually over a bony prominence. (Active)  12/02/23 1500  Location: Sacrum  Location Orientation: Mid  Staging: Stage 1 -  Intact skin with non-blanchable redness of a localized area usually over a bony prominence.  Wound Description (Comments):   Present on Admission: No             Subjective:  Patient doing well today.  Complaining some left upper quadrant ultrasound last night.  Normal bowel movements.  Physical Exam: Vitals:   12/21/23 1100 12/21/23 1130 12/21/23 1137 12/21/23 1240  BP: 122/77 130/84 137/84 (!) 119/92  Pulse: 77 77 78 83  Resp:  16 16 17   Temp:   98.4 F (36.9 C) 98.2 F (36.8 C)  TempSrc:   Oral Oral  SpO2: 100% 100% 100% 100%  Weight:      Height:       General exam: Appears calm and comfortable  Respiratory system: Clear to auscultation. Respiratory effort normal. Cardiovascular system: S1 & S2 heard, RRR. No JVD, murmurs, rubs, gallops or clicks. No pedal edema. Gastrointestinal system: Abdomen is nondistended, soft and nontender. No organomegaly or masses felt. Normal bowel sounds heard. Central nervous system: Alert and oriented. No focal neurological deficits. Extremities: Symmetric 5 x 5 power. Skin: No rashes, lesions or ulcers Psychiatry: Judgement and insight appear normal. Mood & affect appropriate.    Data Reviewed:  There are no new results to review at this time.  Family Communication: None  Disposition: Status is: Inpatient Remains inpatient  appropriate because: Unsafe discharge.     Time spent: 25 minutes  Author: Marrion Coy, MD 12/21/2023 1:04 PM  For on call review [email or web address].

## 2023-12-21 NOTE — TOC Progression Note (Signed)
 Transition of Care [facility]) - Progression Note    Patient Details  Name: Casey Franco MRN: [id number] Date of Birth: 21-May-1961  Transition of Care [facility]) CM/SW Contact  Chapman Fitch, RN Phone Number: 12/21/2023, 12:59 PM  Clinical Narrative:      Called and spoke with [facility] rep Vonna Kotyk.  They said there is no pending appeal on file, and they state it does not show that Appeal for SNF denial was initiated.   Call placed to sister Bobbi.  She states that she called an appeal in on Friday.  She is to call insurance now speak with them.  She states there is no plan in place for patient to have anywhere to go to at discharge if SNF is not approved.   Went to speak with patient at bedside.  Attempted to discuss Plan B with him if decision remains to deny SNF.  He began speaking loudly and states "dont you worry about all of that, my sister is taking care of all of that".  Patient would not discuss further       Expected Discharge Plan and Services         Expected Discharge Date: 12/15/23                                     Social Determinants of Health (SDOH) Interventions SDOH Screenings   Food Insecurity: Patient Declined (11/28/2023)  Housing: Low Risk  (12/06/2023)  Transportation Needs: Patient Declined (11/28/2023)  Utilities: Not At Risk (11/28/2023)  Financial Resource Strain: Low Risk  (05/15/2020)   Received from [facility], [facility]  Tobacco Use: Medium Risk (11/26/2023)    Readmission Risk Interventions     No data to display

## 2023-12-22 ENCOUNTER — Inpatient Hospital Stay

## 2023-12-22 DIAGNOSIS — K652 Spontaneous bacterial peritonitis: Secondary | ICD-10-CM | POA: Diagnosis not present

## 2023-12-22 DIAGNOSIS — I480 Paroxysmal atrial fibrillation: Secondary | ICD-10-CM

## 2023-12-22 DIAGNOSIS — K7460 Unspecified cirrhosis of liver: Secondary | ICD-10-CM | POA: Diagnosis not present

## 2023-12-22 DIAGNOSIS — R188 Other ascites: Secondary | ICD-10-CM | POA: Diagnosis not present

## 2023-12-22 LAB — GLUCOSE, CAPILLARY
Glucose-Capillary: 120 mg/dL — ABNORMAL HIGH (ref 70–99)
Glucose-Capillary: 112 mg/dL — ABNORMAL HIGH (ref 70–99)
Glucose-Capillary: 124 mg/dL — ABNORMAL HIGH (ref 70–99)
Glucose-Capillary: 133 mg/dL — ABNORMAL HIGH (ref 70–99)

## 2023-12-22 MED ORDER — ALBUMIN HUMAN 25 % IV SOLN
25.0000 g | Freq: Once | INTRAVENOUS | Status: DC
  Filled 2023-12-22: qty 100

## 2023-12-22 MED ORDER — MELATONIN 5 MG PO TABS
5.0000 mg | ORAL_TABLET | Freq: Every day | ORAL | Status: DC
  Administered 2023-12-22: 5 mg via ORAL
  Filled 2023-12-22: qty 1

## 2023-12-22 MED ORDER — LIDOCAINE HCL (PF) 1 % IJ SOLN
10.0000 mL | Freq: Once | INTRAMUSCULAR | Status: AC
  Administered 2023-12-22: 10 mL via SUBCUTANEOUS
  Filled 2023-12-22: qty 10

## 2023-12-22 NOTE — Progress Notes (Signed)
 Physical Therapy Treatment Patient Details Name: Casey Franco MRN: [id number] DOB: [birth year] Today's Date: 12/22/2023   History of Present Illness presented to ER secondary to abdominal pain, chest pain and coffee-ground emesis; admitted for management of spontaneous bacterial peritonitis.  s/p paracentesis with 1.6L removed on 2/13    PT Comments  Patient alert, seated EOB with tech upon PT arrival in preparation for ambulation. Able to stand with RW and supervision. He ambulated ~345ft in total with supervision. Attempted ambulation without RW and pt did have 1 LOB, pt/therapist agreed on continued RW use to maximize safety. Pt declined stair navigation at this time, stated he did "not feel ready". Pt is self directive with all tasks. Pt also provided education on supine exercises (which pt stated he does regularly) and PT demonstrated seated therex. The patient would benefit from further skilled PT intervention to continue to progress towards goals, care team updated on pt status.     If plan is discharge home, recommend the following: Assistance with cooking/housework;Direct supervision/assist for medications management;Direct supervision/assist for financial management;Assist for transportation;Help with stairs or ramp for entrance   Can travel by private vehicle     Yes  Equipment Recommendations  Rolling walker (2 wheels)    Recommendations for Other Services       Precautions / Restrictions Precautions Precautions: Fall Recall of Precautions/Restrictions: Impaired Restrictions Weight Bearing Restrictions Per Provider Order: No     Mobility  Bed Mobility Overal bed mobility: Modified Independent             General bed mobility comments: pt sitting EOB with tech upon arrival, able to return supine modI    Transfers Overall transfer level: Needs assistance Equipment used: Rolling walker (2 wheels) Transfers: Sit to/from Stand Sit to Stand: Supervision                 Ambulation/Gait Ambulation/Gait assistance: Supervision Gait Distance (Feet): 350 Feet Assistive device: Rolling walker (2 wheels), None         General Gait Details: attempted ambulation without RW but did have a LOB pt/therapist agreed to continued RW use   Stairs             Wheelchair Mobility     Tilt Bed    Modified Rankin (Stroke Patients Only)       Balance Overall balance assessment: Needs assistance Sitting-balance support: Feet supported, No upper extremity supported Sitting balance-Leahy Scale: Good     Standing balance support: Bilateral upper extremity supported, During functional activity, Reliant on assistive device for balance Standing balance-Leahy Scale: Fair                              Communication    Cognition Arousal: Alert Behavior During Therapy: Impulsive                           PT - Cognition Comments: pt is very self directive Following commands: Intact Following commands impaired: Only follows one step commands consistently    Cueing    Exercises      General Comments        Pertinent Vitals/Pain Pain Assessment Pain Assessment: Faces Faces Pain Scale: No hurt    Home Living                          Prior Function  PT Goals (current goals can now be found in the care plan section) Progress towards PT goals: Progressing toward goals    Frequency    Min 1X/week      PT Plan      Co-evaluation              AM-PAC PT "6 Clicks" Mobility   Outcome Measure  Help needed turning from your back to your side while in a flat bed without using bedrails?: None Help needed moving from lying on your back to sitting on the side of a flat bed without using bedrails?: None Help needed moving to and from a bed to a chair (including a wheelchair)?: None Help needed standing up from a chair using your arms (e.g., wheelchair or bedside chair)?: None Help  needed to walk in hospital room?: None Help needed climbing 3-5 steps with a railing? : A Little 6 Click Score: 23    End of Session   Activity Tolerance: Patient tolerated treatment well Patient left: in bed;with call bell/phone within reach Nurse Communication: Mobility status PT Visit Diagnosis: Difficulty in walking, not elsewhere classified (R26.2);Muscle weakness (generalized) (M62.81)     Time: 0981-1914 PT Time Calculation (min) (ACUTE ONLY): 8 min  Charges:    $Therapeutic Activity: 8-22 mins PT General Charges $$ ACUTE PT VISIT: 1 Visit                     Olga Coaster PT, DPT 11:36 AM,12/22/23

## 2023-12-22 NOTE — Plan of Care (Signed)

## 2023-12-22 NOTE — Procedures (Signed)
 PROCEDURE SUMMARY:  Successful US guided paracentesis from mid right abdomen.  Yielded 2 liters of opaque, yellow fluid.  No immediate complications.  Patient tolerated well.  EBL = trace  Loman Brooklyn PA-C 12/22/2023 3:03 PM

## 2023-12-22 NOTE — Plan of Care (Signed)
  Problem: Clinical Measurements: Goal: Respiratory complications will improve Outcome: Progressing   Problem: Coping: Goal: Level of anxiety will decrease Outcome: Progressing   Problem: Elimination: Goal: Will not experience complications related to bowel motility Outcome: Progressing Goal: Will not experience complications related to urinary retention Outcome: Progressing   Problem: Pain Managment: Goal: General experience of comfort will improve and/or be controlled Outcome: Progressing   Problem: Safety: Goal: Ability to remain free from injury will improve Outcome: Progressing

## 2023-12-22 NOTE — Progress Notes (Signed)
 Progress Note   Patient: Casey Franco:[id number] DOB: Jan 07, 1961 DOA: 11/26/2023     26 DOS: the patient was seen and examined on 12/22/2023   Brief hospital course: KEYON LILLER is a 63 y.o. 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  He also complained of nausea, increasing abdominal distention and multiple episodes of vomiting.  Patient is diagnosed with spontaneous bacterial peritonitis, he has completed 2 weeks of antibiotics, currently on prophylactic Cipro. Patient also had a paracentesis on 2/18 with removal of 4.6 L of fluids. Patient condition has improved, patient parents has denied the patient nursing home placement, patient decided to appeal for the discharge.  But so far, appeal has not been submitted.  Patient refused to go home.   Principal Problem:   Spontaneous bacterial peritonitis (HCC) Active Problems:   Abdominal pain   Cirrhosis of liver with ascites (HCC)   Myocardial injury   Essential hypertension   ESRD (end stage renal disease) (HCC)   Coffee ground emesis   Paroxysmal atrial fibrillation (HCC)   Sepsis without acute organ dysfunction (HCC)   Thrombocytopenia (HCC)   Pressure injury of skin   Assessment and Plan:  Spontaneous bacterial peritonitis Abdominal pain Liver cirrhosis with ascites: S/p paracentesis with removal of 1600 mL of fluid on 11/26/2023.   Fluid positive for SBP (ascitic fluid total nucleated cells 1,326, percentage neutrophils was 68% and absolute PMN count 901) Ascitic fluid culture showed Streptococcus mitis and Citrobacter amalonaticus S/p repeat paracentesis on 12/01/2023 with removal of 4.6 L of fluid. Percentage neutrophil count was 100 and total nucleated cell count 4,687. He was evaluated by the general surgeon.  No indication for  surgery at this time.   Has completed 2 weeks of antibiotics for SBP Continue on SBP prophylaxis with ciprofloxacin Patient has increased abdominal distention, will repeat paracentesis today followed with 25 g albumin.     Bacteroides  thetaiotaomicron bacteremia (blood cultures from 11/26/2023 growing gram-negative rods).   Has completed 2 weeks of metronidazole as recommended by infectious disease on 12/14/2023 Blood culture also positive for staph epi which he appeared to be a contaminant.     Hematemesis: Resolved.  This is probably from multiple episodes of vomiting.   H&H is stable.   Severe protein calorie malnutrition. Patient has significant muscle atrophy, continue protein supplement.   Elevated troponins: Troponin is 172, 138, 127 and 128.  No chest pain or acute EKG changes. Due to demand ischemia.     Paroxysmal atrial fibrillation, PVCs: Heart rate is better.  Continue metoprolol.  We discussed risks, benefits and alternatives to long-term anticoagulation for atrial fibrillation. He is at increased risk for bleeding from long-term anticoagulation because of underlying severe liver and kidney disease. CHA2DS2-VASc score is 3 and he is also at increased risk for stroke. Patient has agreed to Eliquis Eliquis initiated as hemoglobin is stabilized   Chronic systolic CHF Moderate mitral regurgitation 2D echo showed EF estimated at 40 to 45%, moderate LVH, indeterminate left ventricular diastolic parameters, mildly reduced RV systolic function, moderately enlarged RV size, moderately dilated right atrium, small pericardial effusion, moderate mitral regurgitation --Volume management by dialysis   Confusion on 11/30/2023, likely acute toxic encephalopathy from oxycodone: Oxycodone has been discontinued. Continue delirium precaution     ESRD on hemodialysis MWF Hyponatremia. --Nephrology following for hemodialysis  Dysphagia: Speech therapist recommended dysphagia 2 diet,  thin liquids.     General weakness: PT recommended discharge to SNF.   Follow-up with TOC to assist with placement.     Essential hypertension Continue current antihypertensives   type II DM   Glucose not significant elevated.   Pressure ulcer: POA Pressure Injury 12/02/23 Sacrum Mid Stage 1 -  Intact skin with non-blanchable redness of a localized area usually over a bony prominence. (Active)  12/02/23 1500  Location: Sacrum  Location Orientation: Mid  Staging: Stage 1 -  Intact skin with non-blanchable redness of a localized area usually over a bony prominence.  Wound Description (Comments):   Present on Admission: No             Subjective:  Patient doing well today, has some distention of the stomach.  But no significant pain.   Physical Exam: Vitals:   12/21/23 1240 12/21/23 1513 12/21/23 1924 12/22/23 0309  BP: (!) 119/92 124/77 118/75 114/80  Pulse: 83 77 80 78  Resp: 17 14 20 20   Temp: 98.2 F (36.8 C) 98.1 F (36.7 C) 98.3 F (36.8 C) 98.8 F (37.1 C)  TempSrc: Oral Oral Oral Oral  SpO2: 100% 100% 99% 100%  Weight:      Height:       General exam: Appears calm and comfortable  Respiratory system: Clear to auscultation. Respiratory effort normal. Cardiovascular system: S1 & S2 heard, RRR. No JVD, murmurs, rubs, gallops or clicks. No pedal edema. Gastrointestinal system: Abdomen is distended, soft and nontender. No organomegaly or masses felt. Normal bowel sounds heard. Central nervous system: Alert and oriented. No focal neurological deficits. Extremities: Symmetric 5 x 5 power. Skin: No rashes, lesions or ulcers Psychiatry: Judgement and insight appear normal. Mood & affect appropriate.    Data Reviewed:  No new results.  Family Communication: None  Disposition: Status is: Inpatient Remains inpatient appropriate because: Patient refused to go home, TOC to work up a plan.     Time spent: 35 minutes  Author: Marrion Coy, MD 12/22/2023  11:59 AM  For on call review [email or web address].

## 2023-12-22 NOTE — Plan of Care (Signed)

## 2023-12-22 NOTE — Progress Notes (Signed)
 [facility]  ROUNDING NOTE   Subjective:   Casey Franco is a 63 y.o. male with past medical history of HTN, ESRD, DM-2, and Hep C. Patient presents to emergency department with abdominal pain. He has been admitted for Intractable vomiting [R11.10] Abdominal pain [R10.9] Pneumonia of both lungs due to infectious organism, unspecified part of lung [J18.9] Sepsis without acute organ dysfunction, due to unspecified organism [facility]) [A41.9]  Patient is known to our practice and receives outpatient dialysis at [facility] on a MWF.  Update  Patient resting quietly in bed Alert and oriented States he may have paracentesis later today  Objective:  Vital signs in last 24 hours:  Temp:  [98.1 F (36.7 C)-98.8 F (37.1 C)] 98.8 F (37.1 C) (03/11 0309) Pulse Rate:  [77-80] 78 (03/11 0309) Resp:  [14-20] 20 (03/11 0309) BP: (114-124)/(75-80) 114/80 (03/11 0309) SpO2:  [99 %-100 %] 100 % (03/11 0309)  Weight change:  Filed Weights   12/18/23 1218 12/21/23 0753 12/21/23 1140  Weight: 60.8 kg 63 kg 63 kg    Intake/Output: I/O last 3 completed shifts: In: 50 [P.O.:50] Out: 0    Intake/Output this shift:  Total I/O In: 120 [P.O.:120] Out: -   Physical Exam: General: NAD  Head: Normocephalic, atraumatic. Moist oral mucosal membranes  Eyes: Anicteric  Lungs:  Clear to auscultation, normal effort  Heart: Regular rate and rhythm, aflutter  Abdomen:  Firm, tender, distension  Extremities: No peripheral edema.  Neurologic: Alert and oriented, moving all four extremities  Skin: No lesions  Access: Right chest tunneled catheter exchanged on 12/15/23    Basic Metabolic Panel: Recent Labs  Lab 12/16/23 0414 12/18/23 0813 12/21/23 0831  NA 133* 133* 133*  K 3.9 3.5 3.7  CL 100 96* 99  CO2 23 26 23   GLUCOSE 126* 122* 104*  BUN 28* 26* 36*  CREATININE 6.28* 5.90* 7.02*  CALCIUM 8.2* 8.1* 8.5*  PHOS 3.3 2.9 3.9    Liver Function Tests: Recent Labs   Lab 12/18/23 0813 12/21/23 0831  ALBUMIN 2.0* 2.0*    No results for input(s): "LIPASE", "AMYLASE" in the last 168 hours.  No results for input(s): "AMMONIA" in the last 168 hours.   CBC: Recent Labs  Lab 12/16/23 0414 12/18/23 0813 12/21/23 0416  WBC 5.9 7.2 8.7  NEUTROABS 3.6  --   --   HGB 8.4* 7.7* 7.4*  HCT 26.4* 24.0* 22.9*  MCV 91.0 92.3 90.5  PLT 144* 145* 206    Cardiac Enzymes: No results for input(s): "CKTOTAL", "CKMB", "CKMBINDEX", "TROPONINI" in the last 168 hours.  BNP: Invalid input(s): "POCBNP"  CBG: Recent Labs  Lab 12/21/23 1213 12/21/23 1656 12/21/23 2044 12/22/23 1109 12/22/23 1148  GLUCAP 87 113* 126* 120* 112*    Microbiology: Results for orders placed or performed during the hospital encounter of 11/26/23  Culture, blood (Routine x 2)     Status: Abnormal   Collection Time: 11/26/23 11:22 AM   Specimen: BLOOD  Result Value Ref Range Status   Specimen Description   Final    BLOOD RIGHT ANTECUBITAL Performed at [facility], [address] [place], [place] [address]    Special Requests   Final    BOTTLES DRAWN AEROBIC AND ANAEROBIC Blood Culture results may not be optimal due to an inadequate volume of blood received in culture bottles Performed at [facility], [address] [place], [place] [address]    Culture  Setup Time   Final  ANAEROBIC BOTTLE ONLY GRAM NEGATIVE RODS CRITICAL RESULT CALLED TO, READ BACK BY AND VERIFIED WITH: CAROLINE COULTER @ 11/28/23 2033 AB    Culture (A)  Final    BACTEROIDES THETAIOTAOMICRON BETA LACTAMASE POSITIVE Performed at [facility], [address], [place], [place] [address]    Report Status 12/01/2023 FINAL  Final  Blood Culture ID Panel (Reflexed)     Status: None   Collection Time: 11/26/23 11:22 AM  Result Value Ref Range Status   Enterococcus faecalis NOT DETECTED NOT DETECTED Final   Enterococcus Faecium NOT DETECTED NOT DETECTED Final   Listeria  monocytogenes NOT DETECTED NOT DETECTED Final   Staphylococcus species NOT DETECTED NOT DETECTED Final   Staphylococcus aureus (BCID) NOT DETECTED NOT DETECTED Final   Staphylococcus epidermidis NOT DETECTED NOT DETECTED Final   Staphylococcus lugdunensis NOT DETECTED NOT DETECTED Final   Streptococcus species NOT DETECTED NOT DETECTED Final   Streptococcus agalactiae NOT DETECTED NOT DETECTED Final   Streptococcus pneumoniae NOT DETECTED NOT DETECTED Final   Streptococcus pyogenes NOT DETECTED NOT DETECTED Final   A.calcoaceticus-baumannii NOT DETECTED NOT DETECTED Final   Bacteroides fragilis NOT DETECTED NOT DETECTED Final   Enterobacterales NOT DETECTED NOT DETECTED Final   Enterobacter cloacae complex NOT DETECTED NOT DETECTED Final   Escherichia coli NOT DETECTED NOT DETECTED Final   Klebsiella aerogenes NOT DETECTED NOT DETECTED Final   Klebsiella oxytoca NOT DETECTED NOT DETECTED Final   Klebsiella pneumoniae NOT DETECTED NOT DETECTED Final   Proteus species NOT DETECTED NOT DETECTED Final   Salmonella species NOT DETECTED NOT DETECTED Final   Serratia marcescens NOT DETECTED NOT DETECTED Final   Haemophilus influenzae NOT DETECTED NOT DETECTED Final   Neisseria meningitidis NOT DETECTED NOT DETECTED Final   Pseudomonas aeruginosa NOT DETECTED NOT DETECTED Final   Stenotrophomonas maltophilia NOT DETECTED NOT DETECTED Final   Candida albicans NOT DETECTED NOT DETECTED Final   Candida auris NOT DETECTED NOT DETECTED Final   Candida glabrata NOT DETECTED NOT DETECTED Final   Candida krusei NOT DETECTED NOT DETECTED Final   Candida parapsilosis NOT DETECTED NOT DETECTED Final   Candida tropicalis NOT DETECTED NOT DETECTED Final   Cryptococcus neoformans/gattii NOT DETECTED NOT DETECTED Final    Comment: Performed at [facility], [address] [place], [place] [address]  Culture, blood (Routine x 2)     Status: Abnormal   Collection Time: 11/26/23 12:23 PM    Specimen: BLOOD LEFT ARM  Result Value Ref Range Status   Specimen Description   Final    BLOOD LEFT ARM Performed at [facility], [address], [place], [place] [address]    Special Requests   Final    BOTTLES DRAWN AEROBIC AND ANAEROBIC Blood Culture results may not be optimal due to an inadequate volume of blood received in culture bottles Performed at [facility], [address] [place], [place] [address]    Culture  Setup Time   Final    GRAM POSITIVE COCCI AEROBIC BOTTLE ONLY CRITICAL RESULT CALLED TO, READ BACK BY AND VERIFIED WITH: BRIANA ALLEY ON 11/27/23 AT 1107 QSD    Culture (A)  Final    STAPHYLOCOCCUS EPIDERMIDIS THE SIGNIFICANCE OF ISOLATING THIS ORGANISM FROM A SINGLE SET OF BLOOD CULTURES WHEN MULTIPLE SETS ARE DRAWN IS UNCERTAIN. PLEASE NOTIFY THE MICROBIOLOGY DEPARTMENT WITHIN ONE WEEK IF SPECIATION AND SENSITIVITIES ARE REQUIRED. Performed at [facility], [address], [place], [place] [address]  Report Status 11/29/2023 FINAL  Final  Blood Culture ID Panel (Reflexed)     Status: Abnormal   Collection Time: 11/26/23 12:23 PM  Result Value Ref Range Status   Enterococcus faecalis NOT DETECTED NOT DETECTED Final   Enterococcus Faecium NOT DETECTED NOT DETECTED Final   Listeria monocytogenes NOT DETECTED NOT DETECTED Final   Staphylococcus species DETECTED (A) NOT DETECTED Final    Comment: CRITICAL RESULT CALLED TO, READ BACK BY AND VERIFIED WITH: BRIANA ALLEY ON 11/27/23 AT 1107 QSD    Staphylococcus aureus (BCID) NOT DETECTED NOT DETECTED Final   Staphylococcus epidermidis DETECTED (A) NOT DETECTED Final    Comment: CRITICAL RESULT CALLED TO, READ BACK BY AND VERIFIED WITH: BRIANA ALLEY ON 11/27/23 AT 1107 QSD    Staphylococcus lugdunensis NOT DETECTED NOT DETECTED Final   Streptococcus species NOT DETECTED NOT DETECTED Final   Streptococcus agalactiae NOT DETECTED NOT DETECTED Final   Streptococcus pneumoniae NOT DETECTED NOT  DETECTED Final   Streptococcus pyogenes NOT DETECTED NOT DETECTED Final   A.calcoaceticus-baumannii NOT DETECTED NOT DETECTED Final   Bacteroides fragilis NOT DETECTED NOT DETECTED Final   Enterobacterales NOT DETECTED NOT DETECTED Final   Enterobacter cloacae complex NOT DETECTED NOT DETECTED Final   Escherichia coli NOT DETECTED NOT DETECTED Final   Klebsiella aerogenes NOT DETECTED NOT DETECTED Final   Klebsiella oxytoca NOT DETECTED NOT DETECTED Final   Klebsiella pneumoniae NOT DETECTED NOT DETECTED Final   Proteus species NOT DETECTED NOT DETECTED Final   Salmonella species NOT DETECTED NOT DETECTED Final   Serratia marcescens NOT DETECTED NOT DETECTED Final   Haemophilus influenzae NOT DETECTED NOT DETECTED Final   Neisseria meningitidis NOT DETECTED NOT DETECTED Final   Pseudomonas aeruginosa NOT DETECTED NOT DETECTED Final   Stenotrophomonas maltophilia NOT DETECTED NOT DETECTED Final   Candida albicans NOT DETECTED NOT DETECTED Final   Candida auris NOT DETECTED NOT DETECTED Final   Candida glabrata NOT DETECTED NOT DETECTED Final   Candida krusei NOT DETECTED NOT DETECTED Final   Candida parapsilosis NOT DETECTED NOT DETECTED Final   Candida tropicalis NOT DETECTED NOT DETECTED Final   Cryptococcus neoformans/gattii NOT DETECTED NOT DETECTED Final   Methicillin resistance mecA/C NOT DETECTED NOT DETECTED Final    Comment: Performed at [facility], [address] [place], [place] [address]  Body fluid culture w Gram Stain     Status: None   Collection Time: 11/26/23  8:55 PM   Specimen: PATH Cytology Peritoneal fluid  Result Value Ref Range Status   Specimen Description   Final    PERITONEAL CYTO Performed at [facility], [address] [place], [place] [address]    Special Requests   Final    NONE Performed at [facility], [address] [place], [place] [address]    Gram Stain   Final    FEW WBC PRESENT,BOTH PMN AND  MONONUCLEAR FEW GRAM POSITIVE COCCI IN CHAINS MODERATE GRAM NEGATIVE RODS FEW GRAM POSITIVE RODS Performed at [facility], [address] [place], [place] [address]    Culture   Final    MODERATE STREPTOCOCCUS MITIS/ORALIS RARE CITROBACTER AMALONATICUS    Report Status 11/30/2023 FINAL  Final   Organism ID, Bacteria STREPTOCOCCUS MITIS/ORALIS  Final   Organism ID, Bacteria CITROBACTER AMALONATICUS  Final      Susceptibility   Citrobacter amalonaticus - MIC*    CEFEPIME <=0.12 SENSITIVE Sensitive     CEFTAZIDIME <=1 SENSITIVE Sensitive  CEFTRIAXONE <=0.25 SENSITIVE Sensitive     CIPROFLOXACIN <=0.25 SENSITIVE Sensitive     GENTAMICIN <=1 SENSITIVE Sensitive     IMIPENEM <=0.25 SENSITIVE Sensitive     TRIMETH/SULFA <=20 SENSITIVE Sensitive     PIP/TAZO <=4 SENSITIVE Sensitive ug/mL    * RARE CITROBACTER AMALONATICUS   Streptococcus mitis/oralis - MIC*    PENICILLIN 0.25 INTERMEDIATE Intermediate     CEFTRIAXONE 0.25 SENSITIVE Sensitive     LEVOFLOXACIN 1 SENSITIVE Sensitive     VANCOMYCIN 0.5 SENSITIVE Sensitive     * MODERATE STREPTOCOCCUS MITIS/ORALIS  Culture, blood (Routine X 2) w Reflex to ID Panel     Status: None   Collection Time: 11/28/23  5:43 AM   Specimen: BLOOD LEFT ARM  Result Value Ref Range Status   Specimen Description BLOOD LEFT ARM  Final   Special Requests   Final    BOTTLES DRAWN AEROBIC AND ANAEROBIC Blood Culture results may not be optimal due to an inadequate volume of blood received in culture bottles   Culture   Final    NO GROWTH 5 DAYS Performed at [facility], [address] [place], [place] [address]    Report Status 12/03/2023 FINAL  Final  Culture, blood (Routine X 2) w Reflex to ID Panel     Status: None   Collection Time: 11/28/23  5:47 AM   Specimen: BLOOD RIGHT HAND  Result Value Ref Range Status   Specimen Description BLOOD RIGHT HAND  Final   Special Requests   Final    BOTTLES DRAWN AEROBIC AND ANAEROBIC Blood  Culture results may not be optimal due to an inadequate volume of blood received in culture bottles   Culture   Final    NO GROWTH 5 DAYS Performed at [facility], [address] [place], [place] [address]    Report Status 12/03/2023 FINAL  Final  Body fluid culture w Gram Stain     Status: None   Collection Time: 12/01/23 10:10 AM   Specimen: Ascitic; Body Fluid  Result Value Ref Range Status   Specimen Description   Final    ASCITIC Performed at [facility], [address] [place], [place] [address]    Special Requests   Final    peri Performed at [facility], [address] [place], [place] [address]    Gram Stain   Final    FEW WBC PRESENT, PREDOMINANTLY PMN ABUNDANT GRAM NEGATIVE RODS ABUNDANT GRAM POSITIVE COCCI RARE GRAM POSITIVE RODS    Culture   Final    FEW STREPTOCOCCUS MITIS/ORALIS RARE CITROBACTER AMALONATICUS MODERATE BACTEROIDES THETAIOTAOMICRON BETA LACTAMASE POSITIVE RARE EIKENELLA CORRODENS Usually susceptible to penicillin and other beta lactam agents,quinolones,macrolides and tetracyclines. Performed at [facility], [address], [place], [place] [address]    Report Status 12/06/2023 FINAL  Final   Organism ID, Bacteria STREPTOCOCCUS MITIS/ORALIS  Final   Organism ID, Bacteria CITROBACTER AMALONATICUS  Final      Susceptibility   Citrobacter amalonaticus - MIC*    CEFEPIME <=0.12 SENSITIVE Sensitive     CEFTAZIDIME <=1 SENSITIVE Sensitive     CEFTRIAXONE <=0.25 SENSITIVE Sensitive     CIPROFLOXACIN <=0.25 SENSITIVE Sensitive     GENTAMICIN <=1 SENSITIVE Sensitive     IMIPENEM <=0.25 SENSITIVE Sensitive     TRIMETH/SULFA <=20 SENSITIVE Sensitive     PIP/TAZO <=4 SENSITIVE Sensitive ug/mL    * RARE CITROBACTER AMALONATICUS   Streptococcus mitis/oralis - MIC*    PENICILLIN <=  0.06 SENSITIVE Sensitive     CEFTRIAXONE 0.25 SENSITIVE Sensitive     LEVOFLOXACIN 1 SENSITIVE Sensitive     VANCOMYCIN 0.5  SENSITIVE Sensitive     * FEW STREPTOCOCCUS MITIS/ORALIS    Coagulation Studies: No results for input(s): "LABPROT", "INR" in the last 72 hours.   Urinalysis: No results for input(s): "COLORURINE", "LABSPEC", "PHURINE", "GLUCOSEU", "HGBUR", "BILIRUBINUR", "KETONESUR", "PROTEINUR", "UROBILINOGEN", "NITRITE", "LEUKOCYTESUR" in the last 72 hours.  Invalid input(s): "APPERANCEUR"    Imaging: No results found.       Medications:    albumin human        amLODipine  10 mg Oral Daily   apixaban  2.5 mg Oral BID   Chlorhexidine Gluconate Cloth  6 each Topical Q0600   ciprofloxacin  250 mg Oral QHS   epoetin alfa-epbx (RETACRIT) injection  10,000 Units Intravenous Q M,W,F-HD   feeding supplement  237 mL Oral TID BM   insulin aspart  0-6 Units Subcutaneous TID WC   irbesartan  300 mg Oral Daily   lactulose  20 g Oral Daily   melatonin  5 mg Oral QHS   metoprolol tartrate  25 mg Oral BID   multivitamin  1 tablet Oral QHS   pantoprazole  40 mg Oral Daily   tamsulosin  0.4 mg Oral Daily   acetaminophen, alum & mag hydroxide-simeth, antiseptic oral rinse, hydrALAZINE, hydrOXYzine, ondansetron (ZOFRAN) IV  Assessment/ Plan:  Mr. Casey Franco is a 63 y.o.  male with HTN, ESRD, DM-2, Hep C.  Patient presents with chest pain and has been admitted for Intractable vomiting [R11.10] Abdominal pain [R10.9] Pneumonia of both lungs due to infectious organism, unspecified part of lung [J18.9] Sepsis without acute organ dysfunction, due to unspecified organism [facility]) [A41.9]  [facility]/MWF/right chest PermCath/71.0 kg  End-stage renal disease on hemodialysis.  Next treatment scheduled for Wednesday.   2. Anemia of chronic kidney disease Lab Results  Component Value Date   HGB 7.4 (L) 12/21/2023  Hgb below optimal range. Continue Epogen with dialysis.  3. Secondary Hyperparathyroidism: with outpatient labs: PTH 351, phosphorus 6.6, calcium 8.1 on 11/23/2023.    Lab Results  Component Value Date   PTH 31 08/30/2018   CALCIUM 8.5 (L) 12/21/2023   PHOS 3.9 12/21/2023    Will continue to monitor during this admission.  4.  Hypertension with chronic kidney disease.  Home regimen includes furosemide, hydralazine, amlodipine, and irbesartan. Amlodipine, Avapro, and metoprolol currently prescribed.  Blood pressure stable  5.  Cirrhosis/ascites, spontaneous bacterial peritonitis. Paracentesis from 11/27/2023 yielded 1.6 L of fluid.  Complex loculations suspected as more ascites present. Microbiology results show Staph epidermidis in the blood. Peritoneal fluid culture from 11/26/2023 shows Streptococcus and Citrobacter. Lactulose twice a day. Paracentesis on 12/02/23 with 4.6L removed. Fluid cultures negative  Treating with oral cipro. Abd distension noted, may receive paracentesis.      LOS: 26 Collyns Mcquigg 3/11/20252:07 PM

## 2023-12-23 ENCOUNTER — Inpatient Hospital Stay

## 2023-12-23 ENCOUNTER — Other Ambulatory Visit: Payer: Self-pay

## 2023-12-23 DIAGNOSIS — K652 Spontaneous bacterial peritonitis: Secondary | ICD-10-CM | POA: Diagnosis not present

## 2023-12-23 LAB — GLUCOSE, CAPILLARY
Glucose-Capillary: 80 mg/dL (ref 70–99)
Glucose-Capillary: 110 mg/dL — ABNORMAL HIGH (ref 70–99)

## 2023-12-23 LAB — RENAL FUNCTION PANEL
Albumin: 1.9 g/dL — ABNORMAL LOW (ref 3.5–5.0)
Anion gap: 10 (ref 5–15)
BUN: 30 mg/dL — ABNORMAL HIGH (ref 8–23)
CO2: 27 mmol/L (ref 22–32)
Calcium: 8.2 mg/dL — ABNORMAL LOW (ref 8.9–10.3)
Chloride: 96 mmol/L — ABNORMAL LOW (ref 98–111)
Creatinine, Ser: 5.79 mg/dL — ABNORMAL HIGH (ref 0.61–1.24)
GFR, Estimated: 10 mL/min — ABNORMAL LOW (ref >60)
Glucose, Bld: 113 mg/dL — ABNORMAL HIGH (ref 70–99)
Phosphorus: 4.2 mg/dL (ref 2.5–4.6)
Potassium: 3.8 mmol/L (ref 3.5–5.1)
Sodium: 133 mmol/L — ABNORMAL LOW (ref 135–145)

## 2023-12-23 LAB — CBC
HCT: 22.3 % — ABNORMAL LOW (ref 39.0–52.0)
HCT: 22.4 % — ABNORMAL LOW (ref 39.0–52.0)
Hemoglobin: 7.3 g/dL — ABNORMAL LOW (ref 13.0–17.0)
Hemoglobin: 7.2 g/dL — ABNORMAL LOW (ref 13.0–17.0)
MCH: 29.8 pg (ref 26.0–34.0)
MCH: 29.8 pg (ref 26.0–34.0)
MCHC: 32.7 g/dL (ref 30.0–36.0)
MCHC: 32.1 g/dL (ref 30.0–36.0)
MCV: 91 fL (ref 80.0–100.0)
MCV: 92.6 fL (ref 80.0–100.0)
Platelets: 225 10*3/uL (ref 150–400)
Platelets: 246 10*3/uL (ref 150–400)
RBC: 2.45 MIL/uL — ABNORMAL LOW (ref 4.22–5.81)
RBC: 2.42 MIL/uL — ABNORMAL LOW (ref 4.22–5.81)
RDW: 17 % — ABNORMAL HIGH (ref 11.5–15.5)
RDW: 17.2 % — ABNORMAL HIGH (ref 11.5–15.5)
WBC: 7.3 10*3/uL (ref 4.0–10.5)
WBC: 8 10*3/uL (ref 4.0–10.5)
nRBC: 0 % (ref 0.0–0.2)
nRBC: 0 % (ref 0.0–0.2)

## 2023-12-23 MED ORDER — LACTULOSE 10 GM/15ML PO SOLN
20.0000 g | Freq: Every day | ORAL | 0 refills | Status: AC
  Filled 2023-12-23: qty 2365, 78d supply, fill #0

## 2023-12-23 MED ORDER — TAMSULOSIN HCL 0.4 MG PO CAPS
0.4000 mg | ORAL_CAPSULE | Freq: Every day | ORAL | 0 refills | Status: AC
  Filled 2023-12-23: qty 90, 90d supply, fill #0

## 2023-12-23 MED ORDER — CIPROFLOXACIN HCL 250 MG PO TABS
250.0000 mg | ORAL_TABLET | Freq: Every day | ORAL | 0 refills | Status: DC
  Filled 2023-12-23: qty 90, 90d supply, fill #0

## 2023-12-23 MED ORDER — ENSURE ENLIVE PO LIQD: 237.0000 mL | Freq: Three times a day (TID) | ORAL | Status: DC

## 2023-12-23 MED ORDER — CALCIUM ACETATE (PHOS BINDER) 667 MG PO CAPS
1334.0000 mg | ORAL_CAPSULE | ORAL | 0 refills | Status: AC
  Filled 2023-12-23: qty 72, 84d supply, fill #0

## 2023-12-23 MED ORDER — MELATONIN 5 MG PO TABS
5.0000 mg | ORAL_TABLET | Freq: Every evening | ORAL | 0 refills | Status: DC | PRN
  Filled 2023-12-23: qty 20, 20d supply, fill #0

## 2023-12-23 MED ORDER — HEPARIN SODIUM (PORCINE) 1000 UNIT/ML IJ SOLN
INTRAMUSCULAR | Status: AC
  Filled 2023-12-23: qty 10

## 2023-12-23 MED ORDER — IRBESARTAN 300 MG PO TABS
300.0000 mg | ORAL_TABLET | Freq: Every day | ORAL | 0 refills | Status: DC
  Filled 2023-12-23: qty 90, 90d supply, fill #0

## 2023-12-23 MED ORDER — ALTEPLASE 2 MG IJ SOLR: 2.0000 mg | Freq: Once | INTRAMUSCULAR | Status: DC | PRN

## 2023-12-23 MED ORDER — HEPARIN SODIUM (PORCINE) 1000 UNIT/ML DIALYSIS: 1000.0000 [IU] | INTRAMUSCULAR | Status: DC | PRN

## 2023-12-23 MED ORDER — APIXABAN 2.5 MG PO TABS
2.5000 mg | ORAL_TABLET | Freq: Two times a day (BID) | ORAL | 0 refills | Status: DC
  Filled 2023-12-23: qty 60, 30d supply, fill #0

## 2023-12-23 MED ORDER — METOPROLOL TARTRATE 25 MG PO TABS
25.0000 mg | ORAL_TABLET | Freq: Two times a day (BID) | ORAL | 0 refills | Status: DC
  Filled 2023-12-23: qty 180, 90d supply, fill #0

## 2023-12-23 MED ORDER — EPOETIN ALFA-EPBX 10000 UNIT/ML IJ SOLN: 10000.0000 [IU] | INTRAMUSCULAR | Status: DC

## 2023-12-23 MED ORDER — EPOETIN ALFA-EPBX 10000 UNIT/ML IJ SOLN
INTRAMUSCULAR | Status: AC
  Filled 2023-12-23: qty 1

## 2023-12-23 MED ORDER — DAILY-VITE MULTIVITAMIN PO TABS
1.0000 | ORAL_TABLET | Freq: Every day | ORAL | 0 refills | Status: AC
  Filled 2023-12-23: qty 100, 100d supply, fill #0

## 2023-12-23 NOTE — Discharge Instructions (Signed)
 Patient's schedule at [facility] is MWF 6:45am, however on Friday 3/14 it will be 6:30am with a 6:15am arrival.  If patient does not attend HD on Friday he will be discharge from the clinic  [facility] They will call you to set up when they are coming out to see you   [address], [place], [place] [address] Hours:  Open ? Closes 5?PM Phone: [phone]

## 2023-12-23 NOTE — Progress Notes (Signed)
 [facility]  ROUNDING NOTE   Subjective:   Casey Franco is a 63 y.o. male with past medical history of HTN, ESRD, DM-2, and Hep C. Patient presents to emergency department with abdominal pain. He has been admitted for Intractable vomiting [R11.10] Abdominal pain [R10.9] Pneumonia of both lungs due to infectious organism, unspecified part of lung [J18.9] Sepsis without acute organ dysfunction, due to unspecified organism [facility]) [A41.9]  Patient is known to our practice and receives outpatient dialysis at [facility] on a MWF.  Update  Patient seen and evaluated during dialysis   HEMODIALYSIS FLOWSHEET:  Blood Flow Rate (mL/min): 399 mL/min Arterial Pressure (mmHg): -162.82 mmHg Venous Pressure (mmHg): 136.96 mmHg TMP (mmHg): 0.2 mmHg Ultrafiltration Rate (mL/min): 86 mL/min Dialysate Flow Rate (mL/min): 300 ml/min  Resting comfortably States he slept well after paracentesis.   Objective:  Vital signs in last 24 hours:  Temp:  [97.9 F (36.6 C)-100.2 F (37.9 C)] 97.9 F (36.6 C) (03/12 1127) Pulse Rate:  [74-80] 78 (03/12 1127) Resp:  [14-21] 17 (03/12 1127) BP: (112-133)/(68-80) 128/78 (03/12 1127) SpO2:  [91 %-100 %] 100 % (03/12 1127) Weight:  [60.5 kg] 60.5 kg (03/12 1133)  Weight change:  Filed Weights   12/21/23 1140 12/23/23 0732 12/23/23 1133  Weight: 63 kg 60.5 kg 60.5 kg    Intake/Output: I/O last 3 completed shifts: In: 170 [P.O.:170] Out: -    Intake/Output this shift:  No intake/output data recorded.  Physical Exam: General: NAD  Head: Normocephalic, atraumatic. Moist oral mucosal membranes  Eyes: Anicteric  Lungs:  Clear to auscultation, normal effort  Heart: Regular rate and rhythm, aflutter  Abdomen:  Soft, tender, nondistended  Extremities: No peripheral edema.  Neurologic: Alert and oriented, moving all four extremities  Skin: No lesions  Access: Right chest tunneled catheter exchanged on 12/15/23    Basic  Metabolic Panel: Recent Labs  Lab 12/18/23 0813 12/21/23 0831 12/23/23 0759  NA 133* 133* 133*  K 3.5 3.7 3.8  CL 96* 99 96*  CO2 26 23 27   GLUCOSE 122* 104* 113*  BUN 26* 36* 30*  CREATININE 5.90* 7.02* 5.79*  CALCIUM 8.1* 8.5* 8.2*  PHOS 2.9 3.9 4.2    Liver Function Tests: Recent Labs  Lab 12/18/23 0813 12/21/23 0831 12/23/23 0759  ALBUMIN 2.0* 2.0* 1.9*    No results for input(s): "LIPASE", "AMYLASE" in the last 168 hours.  No results for input(s): "AMMONIA" in the last 168 hours.   CBC: Recent Labs  Lab 12/18/23 0813 12/21/23 0416 12/23/23 0424 12/23/23 0759  WBC 7.2 8.7 7.3 8.0  HGB 7.7* 7.4* 7.3* 7.2*  HCT 24.0* 22.9* 22.3* 22.4*  MCV 92.3 90.5 91.0 92.6  PLT 145* 206 225 246    Cardiac Enzymes: No results for input(s): "CKTOTAL", "CKMB", "CKMBINDEX", "TROPONINI" in the last 168 hours.  BNP: Invalid input(s): "POCBNP"  CBG: Recent Labs  Lab 12/21/23 2044 12/22/23 1109 12/22/23 1148 12/22/23 1623 12/22/23 2122  GLUCAP 126* 120* 112* 124* 133*    Microbiology: Results for orders placed or performed during the hospital encounter of 11/26/23  Culture, blood (Routine x 2)     Status: Abnormal   Collection Time: 11/26/23 11:22 AM   Specimen: BLOOD  Result Value Ref Range Status   Specimen Description   Final    BLOOD RIGHT ANTECUBITAL Performed at [facility], [address] [place], [place] [address]    Special Requests   Final    BOTTLES DRAWN AEROBIC  AND ANAEROBIC Blood Culture results may not be optimal due to an inadequate volume of blood received in culture bottles Performed at [facility], [address] [place], [place] [address]    Culture  Setup Time   Final    ANAEROBIC BOTTLE ONLY GRAM NEGATIVE RODS CRITICAL RESULT CALLED TO, READ BACK BY AND VERIFIED WITH: CAROLINE COULTER @ 11/28/23 2033 AB    Culture (A)  Final    BACTEROIDES THETAIOTAOMICRON BETA LACTAMASE POSITIVE Performed at [facility], [address], [place], [place] [address]    Report Status 12/01/2023 FINAL  Final  Blood Culture ID Panel (Reflexed)     Status: None   Collection Time: 11/26/23 11:22 AM  Result Value Ref Range Status   Enterococcus faecalis NOT DETECTED NOT DETECTED Final   Enterococcus Faecium NOT DETECTED NOT DETECTED Final   Listeria monocytogenes NOT DETECTED NOT DETECTED Final   Staphylococcus species NOT DETECTED NOT DETECTED Final   Staphylococcus aureus (BCID) NOT DETECTED NOT DETECTED Final   Staphylococcus epidermidis NOT DETECTED NOT DETECTED Final   Staphylococcus lugdunensis NOT DETECTED NOT DETECTED Final   Streptococcus species NOT DETECTED NOT DETECTED Final   Streptococcus agalactiae NOT DETECTED NOT DETECTED Final   Streptococcus pneumoniae NOT DETECTED NOT DETECTED Final   Streptococcus pyogenes NOT DETECTED NOT DETECTED Final   A.calcoaceticus-baumannii NOT DETECTED NOT DETECTED Final   Bacteroides fragilis NOT DETECTED NOT DETECTED Final   Enterobacterales NOT DETECTED NOT DETECTED Final   Enterobacter cloacae complex NOT DETECTED NOT DETECTED Final   Escherichia coli NOT DETECTED NOT DETECTED Final   Klebsiella aerogenes NOT DETECTED NOT DETECTED Final   Klebsiella oxytoca NOT DETECTED NOT DETECTED Final   Klebsiella pneumoniae NOT DETECTED NOT DETECTED Final   Proteus species NOT DETECTED NOT DETECTED Final   Salmonella species NOT DETECTED NOT DETECTED Final   Serratia marcescens NOT DETECTED NOT DETECTED Final   Haemophilus influenzae NOT DETECTED NOT DETECTED Final   Neisseria meningitidis NOT DETECTED NOT DETECTED Final   Pseudomonas aeruginosa NOT DETECTED NOT DETECTED Final   Stenotrophomonas maltophilia NOT DETECTED NOT DETECTED Final   Candida albicans NOT DETECTED NOT DETECTED Final   Candida auris NOT DETECTED NOT DETECTED Final   Candida glabrata NOT DETECTED NOT DETECTED Final   Candida krusei NOT DETECTED NOT DETECTED Final   Candida parapsilosis  NOT DETECTED NOT DETECTED Final   Candida tropicalis NOT DETECTED NOT DETECTED Final   Cryptococcus neoformans/gattii NOT DETECTED NOT DETECTED Final    Comment: Performed at [facility], [address] [place], [place] [address]  Culture, blood (Routine x 2)     Status: Abnormal   Collection Time: 11/26/23 12:23 PM   Specimen: BLOOD LEFT ARM  Result Value Ref Range Status   Specimen Description   Final    BLOOD LEFT ARM Performed at [facility], [address], [place], [place] [address]    Special Requests   Final    BOTTLES DRAWN AEROBIC AND ANAEROBIC Blood Culture results may not be optimal due to an inadequate volume of blood received in culture bottles Performed at [facility], [address] [place], [place] [address]    Culture  Setup Time   Final    GRAM POSITIVE COCCI AEROBIC BOTTLE ONLY CRITICAL RESULT CALLED TO, READ BACK BY AND VERIFIED WITH: BRIANA ALLEY ON 11/27/23 AT 1107 QSD    Culture (A)  Final    STAPHYLOCOCCUS EPIDERMIDIS THE SIGNIFICANCE OF ISOLATING THIS  ORGANISM FROM A SINGLE SET OF BLOOD CULTURES WHEN MULTIPLE SETS ARE DRAWN IS UNCERTAIN. PLEASE NOTIFY THE MICROBIOLOGY DEPARTMENT WITHIN ONE WEEK IF SPECIATION AND SENSITIVITIES ARE REQUIRED. Performed at [facility], [address] [place], [place] [address]    Report Status 11/29/2023 FINAL  Final  Blood Culture ID Panel (Reflexed)     Status: Abnormal   Collection Time: 11/26/23 12:23 PM  Result Value Ref Range Status   Enterococcus faecalis NOT DETECTED NOT DETECTED Final   Enterococcus Faecium NOT DETECTED NOT DETECTED Final   Listeria monocytogenes NOT DETECTED NOT DETECTED Final   Staphylococcus species DETECTED (A) NOT DETECTED Final    Comment: CRITICAL RESULT CALLED TO, READ BACK BY AND VERIFIED WITH: BRIANA ALLEY ON 11/27/23 AT 1107 QSD    Staphylococcus aureus (BCID) NOT DETECTED NOT DETECTED Final   Staphylococcus epidermidis DETECTED (A) NOT DETECTED Final     Comment: CRITICAL RESULT CALLED TO, READ BACK BY AND VERIFIED WITH: BRIANA ALLEY ON 11/27/23 AT 1107 QSD    Staphylococcus lugdunensis NOT DETECTED NOT DETECTED Final   Streptococcus species NOT DETECTED NOT DETECTED Final   Streptococcus agalactiae NOT DETECTED NOT DETECTED Final   Streptococcus pneumoniae NOT DETECTED NOT DETECTED Final   Streptococcus pyogenes NOT DETECTED NOT DETECTED Final   A.calcoaceticus-baumannii NOT DETECTED NOT DETECTED Final   Bacteroides fragilis NOT DETECTED NOT DETECTED Final   Enterobacterales NOT DETECTED NOT DETECTED Final   Enterobacter cloacae complex NOT DETECTED NOT DETECTED Final   Escherichia coli NOT DETECTED NOT DETECTED Final   Klebsiella aerogenes NOT DETECTED NOT DETECTED Final   Klebsiella oxytoca NOT DETECTED NOT DETECTED Final   Klebsiella pneumoniae NOT DETECTED NOT DETECTED Final   Proteus species NOT DETECTED NOT DETECTED Final   Salmonella species NOT DETECTED NOT DETECTED Final   Serratia marcescens NOT DETECTED NOT DETECTED Final   Haemophilus influenzae NOT DETECTED NOT DETECTED Final   Neisseria meningitidis NOT DETECTED NOT DETECTED Final   Pseudomonas aeruginosa NOT DETECTED NOT DETECTED Final   Stenotrophomonas maltophilia NOT DETECTED NOT DETECTED Final   Candida albicans NOT DETECTED NOT DETECTED Final   Candida auris NOT DETECTED NOT DETECTED Final   Candida glabrata NOT DETECTED NOT DETECTED Final   Candida krusei NOT DETECTED NOT DETECTED Final   Candida parapsilosis NOT DETECTED NOT DETECTED Final   Candida tropicalis NOT DETECTED NOT DETECTED Final   Cryptococcus neoformans/gattii NOT DETECTED NOT DETECTED Final   Methicillin resistance mecA/C NOT DETECTED NOT DETECTED Final    Comment: Performed at [facility], [address] [place], [place] [address]  Body fluid culture w Gram Stain     Status: None   Collection Time: 11/26/23  8:55 PM   Specimen: PATH Cytology Peritoneal fluid  Result Value Ref  Range Status   Specimen Description   Final    PERITONEAL CYTO Performed at [facility], [address], [place], [place] [address]    Special Requests   Final    NONE Performed at [facility], [address] [place], [place] [address]    Gram Stain   Final    FEW WBC PRESENT,BOTH PMN AND MONONUCLEAR FEW GRAM POSITIVE COCCI IN CHAINS MODERATE GRAM NEGATIVE RODS FEW GRAM POSITIVE RODS Performed at [facility], [address], [place], [place] [address]    Culture   Final    MODERATE STREPTOCOCCUS MITIS/ORALIS RARE CITROBACTER AMALONATICUS    Report Status 11/30/2023 FINAL  Final   Organism ID,  Bacteria STREPTOCOCCUS MITIS/ORALIS  Final   Organism ID, Bacteria CITROBACTER AMALONATICUS  Final      Susceptibility   Citrobacter amalonaticus - MIC*    CEFEPIME <=0.12 SENSITIVE Sensitive     CEFTAZIDIME <=1 SENSITIVE Sensitive     CEFTRIAXONE <=0.25 SENSITIVE Sensitive     CIPROFLOXACIN <=0.25 SENSITIVE Sensitive     GENTAMICIN <=1 SENSITIVE Sensitive     IMIPENEM <=0.25 SENSITIVE Sensitive     TRIMETH/SULFA <=20 SENSITIVE Sensitive     PIP/TAZO <=4 SENSITIVE Sensitive ug/mL    * RARE CITROBACTER AMALONATICUS   Streptococcus mitis/oralis - MIC*    PENICILLIN 0.25 INTERMEDIATE Intermediate     CEFTRIAXONE 0.25 SENSITIVE Sensitive     LEVOFLOXACIN 1 SENSITIVE Sensitive     VANCOMYCIN 0.5 SENSITIVE Sensitive     * MODERATE STREPTOCOCCUS MITIS/ORALIS  Culture, blood (Routine X 2) w Reflex to ID Panel     Status: None   Collection Time: 11/28/23  5:43 AM   Specimen: BLOOD LEFT ARM  Result Value Ref Range Status   Specimen Description BLOOD LEFT ARM  Final   Special Requests   Final    BOTTLES DRAWN AEROBIC AND ANAEROBIC Blood Culture results may not be optimal due to an inadequate volume of blood received in culture bottles   Culture   Final    NO GROWTH 5 DAYS Performed at [facility], [address] [place] [address] [place], [place] [address]     Report Status 12/03/2023 FINAL  Final  Culture, blood (Routine X 2) w Reflex to ID Panel     Status: None   Collection Time: 11/28/23  5:47 AM   Specimen: BLOOD RIGHT HAND  Result Value Ref Range Status   Specimen Description BLOOD RIGHT HAND  Final   Special Requests   Final    BOTTLES DRAWN AEROBIC AND ANAEROBIC Blood Culture results may not be optimal due to an inadequate volume of blood received in culture bottles   Culture   Final    NO GROWTH 5 DAYS Performed at [facility], [address], [place], [place] [address]    Report Status 12/03/2023 FINAL  Final  Body fluid culture w Gram Stain     Status: None   Collection Time: 12/01/23 10:10 AM   Specimen: Ascitic; Body Fluid  Result Value Ref Range Status   Specimen Description   Final    ASCITIC Performed at [facility], [address] [place], [place] [address]    Special Requests   Final    peri Performed at [facility], [address] [place], [place] [address]    Gram Stain   Final    FEW WBC PRESENT, PREDOMINANTLY PMN ABUNDANT GRAM NEGATIVE RODS ABUNDANT GRAM POSITIVE COCCI RARE GRAM POSITIVE RODS    Culture   Final    FEW STREPTOCOCCUS MITIS/ORALIS RARE CITROBACTER AMALONATICUS MODERATE BACTEROIDES THETAIOTAOMICRON BETA LACTAMASE POSITIVE RARE EIKENELLA CORRODENS Usually susceptible to penicillin and other beta lactam agents,quinolones,macrolides and tetracyclines. Performed at [facility], [address], [place], [place] [address]    Report Status 12/06/2023 FINAL  Final   Organism ID, Bacteria STREPTOCOCCUS MITIS/ORALIS  Final   Organism ID, Bacteria CITROBACTER AMALONATICUS  Final      Susceptibility   Citrobacter amalonaticus - MIC*    CEFEPIME <=0.12 SENSITIVE Sensitive     CEFTAZIDIME <=1 SENSITIVE Sensitive     CEFTRIAXONE <=0.25 SENSITIVE Sensitive     CIPROFLOXACIN <=0.25 SENSITIVE Sensitive     GENTAMICIN <=  1 SENSITIVE Sensitive     IMIPENEM <=0.25  SENSITIVE Sensitive     TRIMETH/SULFA <=20 SENSITIVE Sensitive     PIP/TAZO <=4 SENSITIVE Sensitive ug/mL    * RARE CITROBACTER AMALONATICUS   Streptococcus mitis/oralis - MIC*    PENICILLIN <=0.06 SENSITIVE Sensitive     CEFTRIAXONE 0.25 SENSITIVE Sensitive     LEVOFLOXACIN 1 SENSITIVE Sensitive     VANCOMYCIN 0.5 SENSITIVE Sensitive     * FEW STREPTOCOCCUS MITIS/ORALIS    Coagulation Studies: No results for input(s): "LABPROT", "INR" in the last 72 hours.   Urinalysis: No results for input(s): "COLORURINE", "LABSPEC", "PHURINE", "GLUCOSEU", "HGBUR", "BILIRUBINUR", "KETONESUR", "PROTEINUR", "UROBILINOGEN", "NITRITE", "LEUKOCYTESUR" in the last 72 hours.  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 For therapeutic paracentesis. EXAM: ULTRASOUND GUIDED RIGHT PARACENTESIS MEDICATIONS: 10 mL 1% lidocaine COMPLICATIONS: None immediate. PROCEDURE: Informed written consent was obtained from the patient after a discussion of the risks, benefits and alternatives to treatment. A timeout was performed prior to the initiation of the procedure. Initial ultrasound scanning demonstrates a large amount of ascites within the right lower abdominal quadrant. The right lower abdomen was prepped and draped in the usual sterile fashion. 1% lidocaine was used for local anesthesia. Following this, a 19 gauge, 7-cm, Yueh catheter was introduced. An ultrasound image was saved for documentation purposes. The paracentesis was performed. The catheter was removed and a dressing was applied. The patient tolerated the procedure well without immediate post procedural complication. FINDINGS: A total of approximately 2 L of opaque yellow fluid was removed. Samples were sent to the laboratory as requested by the clinical team.  IMPRESSION: Successful ultrasound-guided paracentesis yielding 2 liters of peritoneal fluid. PLAN: If the patient eventually requires >/=2 paracenteses in a 30 day period, candidacy for formal evaluation by the [facility] Interventional Radiology [facility] Hypert[facility] will be assessed. Performed By Theresa Mulligan, PA-C Electronically Signed   By: Gilmer Mor D.O.   On: 12/22/2023 16:41         Medications:    albumin human        amLODipine  10 mg Oral Daily   apixaban  2.5 mg Oral BID   Chlorhexidine Gluconate Cloth  6 each Topical Q0600   ciprofloxacin  250 mg Oral QHS   epoetin alfa-epbx (RETACRIT) injection  10,000 Units Intravenous Q M,W,F-HD   feeding supplement  237 mL Oral TID BM   insulin aspart  0-6 Units Subcutaneous TID WC   irbesartan  300 mg Oral Daily   lactulose  20 g Oral Daily   melatonin  5 mg Oral QHS   metoprolol tartrate  25 mg Oral BID   multivitamin  1 tablet Oral QHS   pantoprazole  40 mg Oral Daily   tamsulosin  0.4 mg Oral Daily   acetaminophen, alum & mag hydroxide-simeth, antiseptic oral rinse, hydrALAZINE, hydrOXYzine, ondansetron (ZOFRAN) IV  Assessment/ Plan:  Casey Franco is a 63 y.o.  male with HTN, ESRD, DM-2, Hep C.  Patient presents with chest pain and has been admitted for Intractable vomiting [R11.10] Abdominal pain [R10.9] Pneumonia of both lungs due to infectious organism, unspecified part of lung [J18.9] Sepsis without acute organ dysfunction, due to unspecified organism [facility]) [A41.9]  [facility]/MWF/right chest PermCath/71.0 kg  End-stage renal disease on hemodialysis.  Received dialysis today, UF  0. Next treatment scheduled for Friday.   2. Anemia of chronic kidney disease Lab Results  Component Value Date   HGB 7.2 (L) 12/23/2023   Continue Epogen with dialysis.  3. Secondary Hyperparathyroidism: with outpatient labs: PTH 351, phosphorus 6.6, calcium 8.1 on 11/23/2023.   Lab Results   Component Value Date   PTH 31 08/30/2018   CALCIUM 8.2 (L) 12/23/2023   PHOS 4.2 12/23/2023    Will continue to monitor bone minerals during this admission.   4.  Hypertension with chronic kidney disease.  Home regimen includes furosemide, hydralazine, amlodipine, and irbesartan. Amlodipine, Avapro, and metoprolol currently prescribed.  Blood pressure stable during dialysis.   5.  Cirrhosis/ascites, spontaneous bacterial peritonitis. Paracentesis from 11/27/2023 yielded 1.6 L of fluid.  Complex loculations suspected as more ascites present. Microbiology results show Staph epidermidis in the blood. Peritoneal fluid culture from 11/26/2023 shows Streptococcus and Citrobacter. Lactulose twice a day. Paracentesis on 12/02/23 with 4.6L removed. Fluid cultures negative  Treating with oral cipro. Paracentesis on 12/23/23 with 2L removed.      LOS: 27 Kimberl Vig 3/12/202512:15 PM

## 2023-12-23 NOTE — TOC Transition Note (Signed)
 Transition of Care [facility]) - Discharge Note   Patient Details  Name: Casey Franco MRN: [id number] Date of Birth: 1961-01-14  [facility] of [facility]) CM/SW Contact:  Chapman Fitch, RN Phone Number: 12/23/2023, 12:26 PM   Clinical Narrative:      Sherron Monday to Clayton Bibles at [facility].  They both confirm that appeal has still not been initiated for denial of SN   Patient to discharge today Patient currently off the unit for HD.  Housing, shelter, and transportation resources left at bedside   Spoke with sister Yvonne Kendall.  She states tat he will be discharging to her home at [address], [place].  She is in agreement to home health services and states she does not have a preference of agency.  Referral made to Shaun with [facility]  Referral made to Jon with [employer] for RW to be delivered to room  [facility] request that HD outpatient time be added to discharge paperwork.  I have included this in AVS  Bobbi to transport at discharge            Patient Goals and CMS Choice            Discharge Placement                       Discharge Plan and Services Additional resources added to the After Visit Summary for                                       Social Drivers of Health (SDOH) Interventions SDOH Screenings   Food Insecurity: Patient Declined (11/28/2023)  Housing: Low Risk  (12/06/2023)  Transportation Needs: Patient Declined (11/28/2023)  Utilities: Not At Risk (11/28/2023)  Financial Resource Strain: Low Risk  (05/15/2020)   Received from [facility]  Tobacco Use: Medium Risk (11/26/2023)     Readmission Risk Interventions     No data to display

## 2023-12-23 NOTE — Plan of Care (Signed)
 Pt d/c home . TOC meds at bedside. BP 128/78 (BP Location: Left Arm)   Pulse 78   Temp 97.9 F (36.6 C) (Oral)   Resp 17   Ht 6' (1.829 m)   Wt 60.5 kg   SpO2 100%   BMI 18.09 kg/m  AVS reviewed, IV removed, personal belongings returned. All follow up questions answered. No other needs voiced at this time. Reva Bores 12/23/23 6:58 PM

## 2023-12-23 NOTE — Progress Notes (Signed)
  Received patient in bed to unit.   Informed consent signed and in chart.    TX duration:3.5 hrs      Transported back to floor Hand-off given to patient's primary nurse. No complaints/distress    Access used: R HD Cath Access issues: none   Total UF removed: 0 Medication(s) given: Retacrit  Post HD VS: WNL Post HD weight: 60.5 kg      Nocholas Damaso,LPN Kidney Dialysis Unit

## 2023-12-23 NOTE — Discharge Planning (Signed)
 Patient's schedule at [facility] is MWF 6:45am, however on Friday 3/14 it will be 6:30am with a 6:15am arrival. The clinic was just on the verge of discharging due to being absent for 30days. However, they have not discharged him yet. But if he does not start back on Friday he will be discharged. The clinic is requesting a CXR.   [name] Dialysis Coordinator II  [facility] Cell: [phone] eFax: [phone] [email or web address]

## 2023-12-27 NOTE — Discharge Summary (Addendum)
 Physician Discharge Summary   Casey Franco  male DOB: 1961/04/26  WUJ:[id number]  PCP: [facility]  Admit date: 11/26/2023 Discharge date: 12/15/23, but actually left the hospital on 12/23/23  Admitted From: home Disposition:  home CODE STATUS: Full code  Discharge Instructions     Call MD for:  redness, tenderness, or signs of infection (pain, swelling, bleeding, redness, odor or green/yellow discharge around incision site)   Complete by: As directed    Call MD for:  severe or increased pain, loss or decreased feeling  in affected limb(s)   Complete by: As directed    Call MD for:  temperature >100.5   Complete by: As directed    Discharge instructions   Complete by: As directed    Okay to use catheter for dialysis   No dressing needed   Complete by: As directed    Replace only if drainage present   Resume previous diet   Complete by: As directed       Hospital Course:  For full details, please see H&P, progress notes, consult notes and ancillary notes.  Briefly,  Casey Franco is a 63 y.o. 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    Patient was diagnosed with spontaneous bacterial peritonitis, he has completed 2 weeks of antibiotics, currently on prophylactic Cipro.  Patient condition improved, insurance denied patient nursing home placement, patient and sister wanted to appeal but didn't.  Pt refused to go home until 3/12 when finally sister agreed to take pt home.  Spontaneous bacterial peritonitis Abdominal pain Liver cirrhosis with ascites:  S/p paracentesis with removal of 1600 mL of fluid on 11/26/2023.   Fluid positive for SBP (ascitic fluid total nucleated cells 1,326, percentage neutrophils was 68% and absolute PMN count 901) Ascitic fluid culture showed Streptococcus  mitis and Citrobacter amalonaticus --Has completed 2 weeks of antibiotics for SBP Continue on SBP prophylaxis with ciprofloxacin.  Recurrent ascites S/p paracentesis with removal of 1600 mL of fluid on 11/26/2023.   S/p repeat paracentesis on 12/01/2023 with removal of 4.6 L of fluid. S/p paracentesis on 12/22/23 with removal of 2L fluid   Bacteroides thetaiotaomicron bacteremia (blood cultures from 11/26/2023 growing gram-negative rods).   Has completed 2 weeks of metronidazole as recommended by infectious disease on 12/14/2023 Blood culture also positive for staph epi which appeared to be a contaminant.   Hematemesis:  Resolved.  This is probably from multiple episodes of vomiting.   H&H is stable.   Severe protein calorie malnutrition, ruled out Underweight --eval by dietician, dx only as underweight.   Elevated troponins 2/2 demand ischemia Troponin is 172, 138, 127 and 128.  No chest pain or acute EKG changes.   Paroxysmal atrial fibrillation, PVCs:  discussed risks, benefits and alternatives to long-term anticoagulation for atrial fibrillation. He is at increased risk for bleeding from long-term anticoagulation because of underlying severe liver and kidney disease. CHA2DS2-VASc score is 3 and he is also at increased risk for stroke. Patient has agreed to Eliquis --cont Lopressor and Eliquis   Chronic systolic CHF Moderate mitral regurgitation 2D echo showed EF estimated at 40 to 45%, moderate LVH, indeterminate left ventricular diastolic parameters, mildly reduced RV systolic function, moderately enlarged RV size, moderately dilated right atrium, small pericardial effusion, moderate mitral regurgitation --Volume management by dialysis   Confusion on 11/30/2023, likely acute  toxic encephalopathy from oxycodone: Oxycodone has been discontinued.   ESRD on hemodialysis MWF Hyponatremia. --Nephrology following for hemodialysis    Dysphagia:  Speech therapist recommended dysphagia  2 diet, thin liquids.   Essential hypertension --started on Lopressor --cont home irbesartan --d/c home amlodipine, lasix and hydralazine   type II DM, not currently active --A1c 5.2, not on hypoglycemics PTA   Pressure ulcer: POA Pressure Injury 12/02/23 Sacrum Mid Stage 1 -  Intact skin with non-blanchable redness of a localized area usually over a bony prominence. (Active)  12/02/23 1500  Location: Sacrum  Location Orientation: Mid  Staging: Stage 1 -  Intact skin with non-blanchable redness of a localized area usually over a bony prominence.  Wound Description (Comments):   Present on Admission: No     Discharge Diagnoses:  Principal Problem:   Spontaneous bacterial peritonitis (HCC) Active Problems:   Abdominal pain   Cirrhosis of liver with ascites (HCC)   Myocardial injury   Essential hypertension   ESRD (end stage renal disease) (HCC)   Coffee ground emesis   Paroxysmal atrial fibrillation (HCC)   Sepsis without acute organ dysfunction (HCC)   Thrombocytopenia (HCC)   Pressure injury of skin   30 Day Unplanned Readmission Risk Score    Flowsheet Row ED to Hosp-Admission (Discharged) from 11/26/2023 in [facility]  30 Day Unplanned Readmission Risk Score (%) 33.63 Filed at 12/23/2023 1600       This score is the patient's risk of an unplanned readmission within 30 days of being discharged (0 -100%). The score is based on dignosis, age, lab data, medications, orders, and past utilization.   Low:  0-14.9   Medium: 15-21.9   High: 22-29.9   Extreme: 30 and above         Discharge Instructions:  Allergies as of 12/23/2023   No Known Allergies      Medication List     STOP taking these medications    amLODipine 10 MG tablet Commonly known as: NORVASC   furosemide 80 MG tablet Commonly known as: LASIX   hydrALAZINE 25 MG tablet Commonly known as: APRESOLINE   polyethylene glycol powder 17 GM/SCOOP  powder Commonly known as: GLYCOLAX/MIRALAX   senna-docusate 8.6-50 MG tablet Commonly known as: Senokot-S   VITAMIN C PO       TAKE these medications    albumin human 25 % bottle Inject 25 grams no matter what if more then 5L are removed then another 25 grams   blood glucose meter kit and supplies Kit Dispense based on patient and insurance preference. Use up to four times daily as directed. (FOR ICD-9 250.00, 250.01).   calcium acetate 667 MG capsule Commonly known as: PHOSLO Take 2 capsules (1,334 mg total) by mouth 3 (three) times a week.   ciprofloxacin 250 MG tablet Commonly known as: CIPRO Take 1 tablet (250 mg total) by mouth at bedtime.   Daily-Vite Multivitamin Tabs Take 1 tablet by mouth at bedtime. What changed: when to take this   Eliquis 2.5 MG Tabs tablet Generic drug: apixaban Take 1 tablet (2.5 mg total) by mouth 2 (two) times daily.   epoetin alfa-epbx 10000 UNIT/ML injection Commonly known as: RETACRIT Inject 1 mL (10,000 Units total) into the vein every Monday, Wednesday, and Friday with hemodialysis.   feeding supplement Liqd Take 237 mLs by mouth 3 (three) times daily between meals.   irbesartan 300 MG tablet Commonly known as: AVAPRO Take 1 tablet (300 mg total)  by mouth daily.   lactulose 10 GM/15ML solution Commonly known as: CHRONULAC Take 30 mLs (20 g total) by mouth daily. What changed: when to take this   melatonin 5 MG Tabs Take 1 tablet (5 mg total) by mouth at bedtime as needed.   metoprolol tartrate 25 MG tablet Commonly known as: LOPRESSOR Take 1 tablet (25 mg total) by mouth 2 (two) times daily.   tamsulosin 0.4 MG Caps capsule Commonly known as: FLOMAX Take 1 capsule (0.4 mg total) by mouth daily.   VITAMIN E PO Take by mouth.         Follow-up Information     Henrene Dodge, MD Follow up on 12/14/2023.   Specialty: General Surgery Why: call to schedule 1 week follow up appointment Contact information: [address] [place] [address] [phone]                 No Known Allergies   The results of significant diagnostics from this hospitalization (including imaging, microbiology, ancillary and laboratory) are listed below for reference.   Consultations:   Procedures/Studies: DG Chest Port 1 View Result Date: 12/23/2023 CLINICAL DATA:  Dialysis patient. EXAM: PORTABLE CHEST 1 VIEW COMPARISON:  11/26/2023 FINDINGS: Right-sided dialysis catheter tip in the right atrium. Cardiomegaly is unchanged. Stable mediastinal contours. Coronary stent visualized. Improved bibasilar opacities. No pulmonary edema. No pneumothorax or large pleural effusion. Chronic change about the left shoulder. IMPRESSION: 1. Improved bibasilar opacities since last month. 2. Unchanged cardiomegaly. Electronically Signed   By: Narda Rutherford M.D.   On: 12/23/2023 17:58   US Paracentesis Result Date: 12/22/2023 INDICATION: 63 year old male with cirrhosis secondary to HCV and ESRD and currently admitted to the hospital starting 11/26/23 for bacterial peritonitis. For therapeutic paracentesis. EXAM: ULTRASOUND GUIDED RIGHT PARACENTESIS MEDICATIONS: 10 mL 1% lidocaine COMPLICATIONS: None immediate. PROCEDURE: Informed written consent was obtained from the patient after a discussion of the risks, benefits and alternatives to treatment. A timeout was performed prior to the initiation of the procedure. Initial ultrasound scanning demonstrates a large amount of ascites within the right lower abdominal quadrant. The right lower abdomen was prepped and draped in the usual sterile fashion. 1% lidocaine was used for local anesthesia. Following this, a 19 gauge, 7-cm, Yueh catheter was introduced. An ultrasound image was saved for documentation purposes. The paracentesis was performed. The catheter was removed and a dressing was applied. The patient tolerated the procedure well without immediate post procedural  complication. FINDINGS: A total of approximately 2 L of opaque yellow fluid was removed. Samples were sent to the laboratory as requested by the clinical team. IMPRESSION: Successful ultrasound-guided paracentesis yielding 2 liters of peritoneal fluid. PLAN: If the patient eventually requires >/=2 paracenteses in a 30 day period, candidacy for formal evaluation by the [facility] will be assessed. Performed By Theresa Mulligan, PA-C Electronically Signed   By: Gilmer Mor D.O.   On: 12/22/2023 16:41   PERIPHERAL VASCULAR CATHETERIZATION Result Date: 12/15/2023 See surgical note for result.  DG Abd 1 View Result Date: 12/07/2023 CLINICAL DATA:  Abdominal pain, nausea/vomiting after dialysis EXAM: ABDOMEN - 1 VIEW COMPARISON:  None Available. FINDINGS: Nonobstructive bowel gas pattern. Visualized osseous structures are within normal limits. Vascular calcifications. IMPRESSION: Negative. Electronically Signed   By: Charline Bills M.D.   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 Found to have spontaneous bacterial peritonitis. Request for diagnostic and therapeutic paracentesis. EXAM: ULTRASOUND GUIDED left PARACENTESIS MEDICATIONS: 1% lidocaine, 10mL. COMPLICATIONS: None immediate. PROCEDURE: Informed written consent was obtained from the patient after a discussion of the risks, benefits and alternatives to treatment. A timeout was performed prior to the initiation of the procedure. Initial ultrasound scanning demonstrates a large amount of ascites within the left lower abdominal quadrant. The left lower abdomen was prepped and draped in the usual sterile fashion. 1% lidocaine was used for local anesthesia. Following this, a 6 Fr Safe-T-Centesis catheter was introduced. An  ultrasound image was saved for documentation purposes. The paracentesis was performed. The catheter was removed and a dressing was applied. The patient tolerated the procedure well without immediate post procedural complication. FINDINGS: A total of approximately 4.6L of opaque, yellow fluid was removed. Samples were sent to the laboratory as requested by the clinical team. IMPRESSION: Successful ultrasound-guided paracentesis yielding 4.6 liters of peritoneal fluid. Procedure Performed by: Sherlene Shams, PA-C Electronically Signed   By: Malachy Moan M.D.   On: 12/01/2023 11:27   ECHOCARDIOGRAM COMPLETE Result Date: 11/28/2023    ECHOCARDIOGRAM REPORT   Patient Name:   Casey Franco Roosevelt Date of Exam: 11/28/2023 Medical Rec #:  [id number]       Height:       72.0 in Accession #:    [id number]      Weight:       143.7 lb Date of Birth:  [birth year]        BSA:          1.852 m Patient Age:    62 years        BP:           141/88 mmHg Patient Gender: M               HR:           113 bpm. Exam Location:  [facility] Procedure: 2D Echo (Both Spectral and Color Flow Doppler were utilized during            procedure). Indications:     Murmur R01.1  History:         Patient has no prior history of Echocardiogram examinations.  Sonographer:     Overton Mam RDCS, FASE Referring Phys:  [id number] Lurene Shadow Diagnosing Phys: Adrian Blackwater IMPRESSIONS  1. Left ventricular ejection fraction, by estimation, is 40 to 45%. The left ventricle has mildly decreased function. The left ventricle demonstrates regional wall motion abnormalities (see scoring diagram/findings for description). The left ventricular  internal cavity size was mildly to moderately dilated. There is moderate concentric left ventricular hypertrophy. Indeterminate diastolic filling due to E-A fusion. The global longitudinal strain is abnormal.  2. T.  3. Right ventricular systolic function is mildly reduced. The right ventricular size is moderately enlarged.  Moderately increased right ventricular wall thickness.  4. Right atrial size was moderately dilated.  5. A small pericardial effusion is present. The pericardial effusion is circumferential.  6. The mitral valve is grossly normal. Moderate mitral valve regurgitation.  7. The aortic valve is calcified. Aortic valve regurgitation is not visualized. Aortic valve sclerosis/calcification is present, without any evidence of aortic stenosis. FINDINGS  Left Ventricular Assist Device: T. Left Ventricle: Left ventricular ejection fraction, by estimation, is 40 to 45%. The left ventricle has mildly decreased function. The left ventricle demonstrates regional wall motion abnormalities. Strain was  performed and the global longitudinal strain  is abnormal. The left ventricular internal cavity size was mildly to moderately dilated. There is moderate concentric left ventricular hypertrophy. Indeterminate diastolic filling due to E-A fusion.  LV Wall Scoring: The mid and distal inferior wall and basal anteroseptal segment are hypokinetic. Right Ventricle: The right ventricular size is moderately enlarged. Moderately increased right ventricular wall thickness. Right ventricular systolic function is mildly reduced. Left Atrium: Left atrial size was normal in size. Right Atrium: Right atrial size was moderately dilated. Pericardium: A small pericardial effusion is present. The pericardial effusion is circumferential. Mitral Valve: The mitral valve is grossly normal. Moderate mitral valve regurgitation. Tricuspid Valve: The tricuspid valve is grossly normal. Tricuspid valve regurgitation is mild. Aortic Valve: The aortic valve is calcified. Aortic valve regurgitation is not visualized. Aortic valve sclerosis/calcification is present, without any evidence of aortic stenosis. Aortic valve peak gradient measures 11.8 mmHg. Pulmonic Valve: The pulmonic valve was grossly normal. Pulmonic valve regurgitation is trivial. Aorta: The aortic  root, ascending aorta and aortic arch are all structurally normal, with no evidence of dilitation or obstruction. IAS/Shunts: No atrial level shunt detected by color flow Doppler. Additional Comments: 3D was performed not requiring image post processing on an independent workstation and was abnormal.  LEFT VENTRICLE PLAX 2D LVIDd:         4.80 cm      Diastology LVIDs:         3.80 cm      LV e' medial:    6.64 cm/s LV PW:         1.30 cm      LV E/e' medial:  15.4 LV IVS:        1.00 cm      LV e' lateral:   12.20 cm/s LVOT diam:     2.00 cm      LV E/e' lateral: 8.4 LV SV:         42 LV SV Index:   23 LVOT Area:     3.14 cm  LV Volumes (MOD) LV vol d, MOD A2C: 165.0 ml LV vol d, MOD A4C: 98.8 ml LV vol s, MOD A2C: 105.0 ml LV vol s, MOD A4C: 56.8 ml LV SV MOD A2C:     60.0 ml LV SV MOD A4C:     98.8 ml LV SV MOD BP:      49.6 ml RIGHT VENTRICLE RV Basal diam:  3.80 cm RV S prime:     5.33 cm/s TAPSE (M-mode): 0.7 cm LEFT ATRIUM             Index        RIGHT ATRIUM           Index LA diam:        4.70 cm 2.54 cm/m   RA Area:     24.50 cm LA Vol (A2C):   79.6 ml 42.99 ml/m  RA Volume:   84.30 ml  45.53 ml/m LA Vol (A4C):   67.7 ml 36.56 ml/m LA Biplane Vol: 77.9 ml 42.07 ml/m  AORTIC VALVE                 PULMONIC VALVE AV Area (Vmax): 1.70 cm     PV Vmax:          1.48 m/s AV Vmax:        171.50 cm/s  PV Peak grad:     8.8 mmHg AV Peak Grad:   11.8 mmHg  PR End Diast Vel: 10.11 msec LVOT Vmax:      92.60 cm/s   RVOT Peak grad:   4 mmHg LVOT Vmean:     60.000 cm/s LVOT VTI:       0.134 m  AORTA Ao Root diam: 3.30 cm Ao Asc diam:  2.90 cm MITRAL VALVE                TRICUSPID VALVE MV Area (PHT): 3.23 cm     TR Peak grad:   37.5 mmHg MV Decel Time: 235 msec     TR Vmax:        306.00 cm/s MV E velocity: 102.00 cm/s                             SHUNTS                             Systemic VTI:  0.13 m                             Systemic Diam: 2.00 cm Adrian Blackwater Electronically signed by Adrian Blackwater  Signature Date/Time: 11/28/2023/4:36:18 PM    Final       Labs: BNP (last 3 results) No results for input(s): "BNP" in the last 8760 hours. Basic Metabolic Panel: Recent Labs  Lab 12/21/23 0831 12/23/23 0759  NA 133* 133*  K 3.7 3.8  CL 99 96*  CO2 23 27  GLUCOSE 104* 113*  BUN 36* 30*  CREATININE 7.02* 5.79*  CALCIUM 8.5* 8.2*  PHOS 3.9 4.2   Liver Function Tests: Recent Labs  Lab 12/21/23 0831 12/23/23 0759  ALBUMIN 2.0* 1.9*   No results for input(s): "LIPASE", "AMYLASE" in the last 168 hours. No results for input(s): "AMMONIA" in the last 168 hours. CBC: Recent Labs  Lab 12/21/23 0416 12/23/23 0424 12/23/23 0759  WBC 8.7 7.3 8.0  HGB 7.4* 7.3* 7.2*  HCT 22.9* 22.3* 22.4*  MCV 90.5 91.0 92.6  PLT 206 225 246   Cardiac Enzymes: No results for input(s): "CKTOTAL", "CKMB", "CKMBINDEX", "TROPONINI" in the last 168 hours. BNP: Invalid input(s): "POCBNP" CBG: Recent Labs  Lab 12/22/23 1148 12/22/23 1623 12/22/23 2122 12/23/23 1215 12/23/23 1735  GLUCAP 112* 124* 133* 80 110*   D-Dimer No results for input(s): "DDIMER" in the last 72 hours. Hgb A1c No results for input(s): "HGBA1C" in the last 72 hours. Lipid Profile No results for input(s): "CHOL", "HDL", "LDLCALC", "TRIG", "CHOLHDL", "LDLDIRECT" in the last 72 hours. Thyroid function studies No results for input(s): "TSH", "T4TOTAL", "T3FREE", "THYROIDAB" in the last 72 hours.  Invalid input(s): "FREET3" Anemia work up No results for input(s): "VITAMINB12", "FOLATE", "FERRITIN", "TIBC", "IRON", "RETICCTPCT" in the last 72 hours. Urinalysis    Component Value Date/Time   COLORURINE YELLOW (A) 08/30/2018 1810   APPEARANCEUR CLEAR (A) 08/30/2018 1810   LABSPEC 1.012 08/30/2018 1810   PHURINE 6.0 08/30/2018 1810   GLUCOSEU 150 (A) 08/30/2018 1810   HGBUR NEGATIVE 08/30/2018 1810   BILIRUBINUR NEGATIVE 08/30/2018 1810   KETONESUR NEGATIVE 08/30/2018 1810   PROTEINUR >=300 (A) 08/30/2018 1810    NITRITE NEGATIVE 08/30/2018 1810   LEUKOCYTESUR NEGATIVE 08/30/2018 1810   Sepsis Labs Recent Labs  Lab 12/21/23 0416 12/23/23 0424 12/23/23 0759  WBC 8.7 7.3 8.0   Microbiology No results found for this or any previous  visit (from the past 240 hours).   Total time spend on discharging this patient, including the last patient exam, discussing the hospital stay, instructions for ongoing care as it relates to all pertinent caregivers, as well as preparing the medical discharge records, prescriptions, and/or referrals as applicable, is 50 minutes.  Billed as level 3 subsequent visit because pt was already discharged.   Darlin Priestly, MD  [facility] 12/27/2023, 4:25 PM

## 2023-12-31 ENCOUNTER — Ambulatory Visit (INDEPENDENT_AMBULATORY_CARE_PROVIDER_SITE_OTHER): Payer: 59 | Admitting: Gastroenterology

## 2023-12-31 ENCOUNTER — Encounter: Payer: Self-pay | Admitting: Gastroenterology

## 2023-12-31 ENCOUNTER — Other Ambulatory Visit: Payer: Self-pay

## 2023-12-31 ENCOUNTER — Telehealth: Payer: Self-pay

## 2023-12-31 VITALS — BP 132/89 | HR 129 | Temp 97.5°F | Ht 71.0 in | Wt 134.0 lb

## 2023-12-31 DIAGNOSIS — N189 Chronic kidney disease, unspecified: Secondary | ICD-10-CM | POA: Diagnosis not present

## 2023-12-31 DIAGNOSIS — B182 Chronic viral hepatitis C: Secondary | ICD-10-CM

## 2023-12-31 DIAGNOSIS — D631 Anemia in chronic kidney disease: Secondary | ICD-10-CM | POA: Diagnosis not present

## 2023-12-31 DIAGNOSIS — K7460 Unspecified cirrhosis of liver: Secondary | ICD-10-CM | POA: Diagnosis not present

## 2023-12-31 DIAGNOSIS — K652 Spontaneous bacterial peritonitis: Secondary | ICD-10-CM

## 2023-12-31 DIAGNOSIS — I8510 Secondary esophageal varices without bleeding: Secondary | ICD-10-CM

## 2023-12-31 MED ORDER — ALBUMIN HUMAN 25 % IV SOLN: INTRAVENOUS | Status: DC

## 2023-12-31 NOTE — Telephone Encounter (Signed)
   [facility] Pre-operative Risk Assessment    Request for surgical clearance:  What type of surgery is being performed? EGD    When is this surgery scheduled? 02/18/2024   Are there any medications that need to be held prior to surgery and how long?Eliquis 2.5mg    Practice name and name of physician performing surgery? [facility] Dr Allegra Lai    What is your office phone and fax number? [phone]   Anesthesia type (None, local, MAC, general) ? General    Casey Franco 12/31/2023, 2:38 PM  _________________________________________________________________   (provider comments below)

## 2023-12-31 NOTE — Progress Notes (Signed)
 Arlyss Repress, MD [address]  [address]  [place], [place] [address]  Main: [phone]  Fax: [phone]    Gastroenterology Consultation  Referring Provider:     Hilda Blades, MD Primary Care Physician:  [facility] Primary Gastroenterologist:  Dr. Arlyss Repress Reason for Consultation: Chronic hep C, decompensated cirrhosis, SBP        HPI:   Casey Franco is a 63 y.o. male referred by Dr. Windell Moment [facility]  for consultation & management of anemia.  Patient has history of end-stage renal disease on hemodialysis, diabetes, hypertension is seen in consultation for anemia.  Patient reports that about 2 weeks ago, he had a 1 week history of black stools.  Currently, he denies any black stools.  He also reports that when he was taking oral iron, his stools were turning black.  He has not been on any iron supplements lately are 2 weeks ago.  Patient's hemoglobin was 6.35 days ago, received 1 unit of PRBCs yesterday and 1 unit today.  His hemoglobin was 8.6 yesterday.  He has history of anemia of chronic disease.  His hemoglobin dropped to 6.2, 1-year ago as well.  Patient has history of chronic hepatitis C, treatment nave, genotype 1b, no known history of cirrhosis.  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 pylori infection as well as chronic hep C.  Patient finished triple therapy.  He reports that his GI symptoms have completely resolved and his appetite has improved.  He is on Mavyret for chronic hep C, will be finishing on Wednesday this week.  He does not have any GI concerns today.  Patient ate 20 minutes ago  Follow-up visit 12/31/2023 Casey Franco was lost to follow-up since 01/2022.  When I saw him in 2023 for management of chronic hep C, he did not have cirrhosis at that time, therefore he was treated with Mavyret.  Unclear if HCV is eradicated.  Patient was  recently admitted at [facility] from 11/26/2023 to 11/25/2023 secondary to new onset of ascites, diagnosed with spontaneous bacterial peritonitis, s/p treatment with 2 weeks of antibiotics and discharged on prophylaxis with ciprofloxacin.  Patient is here today because of recurrence of abdominal distention with recommendation of fluid, loss of appetite, ongoing weight loss and severe muscle wasting.  He lives with his sister and is accompanied by his sister today.  He denies any rectal bleeding, melena, hematemesis, nausea or vomiting.  He denies any fever, chills.  He makes very minimal urine  NSAIDs: None  Antiplts/Anticoagulants/Anti thrombotics: None  GI Procedures: EGD and colonoscopy 11/21/2021 H. pylori gastritis, small tubular adenoma of the colon  Father with lung cancer, mother with liver cancer  Past Medical History:  Diagnosis Date   AKI (acute kidney injury) (HCC) 08/30/2018   Diabetes mellitus without complication (HCC)    no meds since weight loss   Dialysis patient (HCC)    Mon, Wed, Fri   Hyperkalemia 02/04/2021   Hypertension    Metabolic acidosis, increased anion gap 02/04/2021   Renal disorder     Past Surgical History:  Procedure Laterality Date   CATARACT EXTRACTION W/PHACO Right 10/08/2023   Procedure: CATARACT EXTRACTION PHACO AND INTRAOCULAR LENS PLACEMENT (IOC) RIGHT DIABETIC 12.19 01:08.8;  Surgeon: Estanislado Pandy, MD;  Location: [facility];  Service: Ophthalmology;  Laterality: Right;   CATARACT EXTRACTION W/PHACO Left 10/22/2023  Procedure: CATARACT EXTRACTION PHACO AND INTRAOCULAR LENS PLACEMENT (IOC) LEFT DIABETIC MALYUGIN;  Surgeon: Estanislado Pandy, MD;  Location: [facility];  Service: Ophthalmology;  Laterality: Left;  7.66 0:54.2   COLONOSCOPY WITH PROPOFOL N/A 11/21/2021   Procedure: COLONOSCOPY WITH PROPOFOL;  Surgeon: Toney Reil, MD;  Location: [facility] END[facility]COPY;  Service: Gastroenterology;  Laterality: N/A;    DIALYSIS/PERMA CATHETER REPAIR N/A 12/15/2023   Procedure: DIALYSIS/PERMA CATHETER REPAIR;  Surgeon: Renford Dills, MD;  Location: [facility] INVASIVE CV LAB;  Service: Cardiovascular;  Laterality: N/A;   ESOPHAGOGASTRODUODENOSCOPY (EGD) WITH PROPOFOL N/A 11/21/2021   Procedure: ESOPHAGOGASTRODUODENOSCOPY (EGD) WITH PROPOFOL;  Surgeon: Toney Reil, MD;  Location: [facility] ENDOSCOPY;  Service: Gastroenterology;  Laterality: N/A;   INSERTION OF DIALYSIS CATHETER     LEG SURGERY  1987   Current Outpatient Medications:    albumin human 25 % bottle, Inject 25 grams no matter what if more then 5L are removed then another 25 grams, Disp: , Rfl:    albumin human 25 % bottle, Inject 25 grams no matter what if more then 5L are removed then another 25 grams (Patient not taking: Reported on 12/31/2023), Disp: , Rfl:    apixaban (ELIQUIS) 2.5 MG TABS tablet, Take 1 tablet (2.5 mg total) by mouth 2 (two) times daily., Disp: 180 tablet, Rfl: 0   blood glucose meter kit and supplies KIT, Dispense based on patient and insurance preference. Use up to four times daily as directed. (FOR ICD-9 250.00, 250.01)., Disp: 1 each, Rfl: 0   calcium acetate (PHOSLO) 667 MG capsule, Take 2 capsules (1,334 mg total) by mouth 3 (three) times a week., Disp: 72 capsule, Rfl: 0   ciprofloxacin (CIPRO) 250 MG tablet, Take 1 tablet (250 mg total) by mouth at bedtime., Disp: 90 tablet, Rfl: 0   epoetin alfa-epbx (RETACRIT) 16109 UNIT/ML injection, Inject 1 mL (10,000 Units total) into the vein every Monday, Wednesday, and Friday with hemodialysis., Disp: , Rfl:    feeding supplement (ENSURE ENLIVE / ENSURE PLUS) LIQD, Take 237 mLs by mouth 3 (three) times daily between meals., Disp: , Rfl:    irbesartan (AVAPRO) 300 MG tablet, Take 1 tablet (300 mg total) by mouth daily., Disp: 90 tablet, Rfl: 0   lactulose (CHRONULAC) 10 GM/15ML solution, Take 30 mLs (20 g total) by mouth daily., Disp: 2700 mL, Rfl: 0   melatonin 5 MG TABS, Take 1  tablet (5 mg total) by mouth at bedtime as needed., Disp: 20 tablet, Rfl: 0   metoprolol tartrate (LOPRESSOR) 25 MG tablet, Take 1 tablet (25 mg total) by mouth 2 (two) times daily., Disp: 180 tablet, Rfl: 0   Multiple Vitamin (DAILY-VITE MULTIVITAMIN) TABS, Take 1 tablet by mouth at bedtime., Disp: 100 tablet, Rfl: 0   tamsulosin (FLOMAX) 0.4 MG CAPS capsule, Take 1 capsule (0.4 mg total) by mouth daily., Disp: 90 capsule, Rfl: 0   VITAMIN E PO, Take by mouth., Disp: , Rfl:   Family History  Problem Relation Age of Onset   Diabetes Mellitus II Sister    Diabetes Mellitus II Paternal Grandmother      Social History   Tobacco Use   Smoking status: Former    Types: Cigarettes   Smokeless tobacco: Never  Vaping Use   Vaping status: Never Used  Substance Use Topics   Alcohol use: Yes    Alcohol/week: 7.0 standard drinks of alcohol    Types: 7 Cans of beer per week   Drug use: Not Currently  Types: Cocaine, Marijuana    Comment: 09/24/23 - Marijuana 3x/week. No cocaine since Friday 08/27/2018    Allergies as of 12/31/2023   (No Known Allergies)    Review of Systems:    All systems reviewed and negative except where noted in HPI.   Physical Exam:  BP 132/89 (BP Location: Left Arm, Patient Position: Sitting, Cuff Size: Normal)   Pulse (!) 129   Temp (!) 97.5 F (36.4 C) (Oral)   Ht 5\' 11"  (1.803 m)   Wt 134 lb (60.8 kg)   BMI 18.69 kg/m  No LMP for male patient.  General:   Alert, cachectic, malnourished, pleasant and cooperative in NAD Head:  Normocephalic and atraumatic. Eyes:  Sclera clear, no icterus.   Conjunctiva pink. Ears:  Normal auditory acuity. Nose:  No deformity, discharge, or lesions. Mouth:  No deformity or lesions,oropharynx pink & moist. Neck:  Supple; no masses or thyromegaly. Lungs:  Respirations even and unlabored.  Clear throughout to auscultation.   No wheezes, crackles, or rhonchi. No acute distress. Heart:  Regular rate and rhythm; no murmurs,  clicks, rubs, or gallops. Abdomen:  Normal bowel sounds. Soft, non-tender and diffusely distended, dull to percussion, nontender without masses, hepatosplenomegaly or hernias noted.  No guarding or rebound tenderness.   Rectal: Not performed Msk:  Symmetrical without gross deformities. Good, equal movement & strength bilaterally. Pulses:  Normal pulses noted. Extremities: Positive clubbing , no edema.  No cyanosis. Neurologic:  Alert and oriented x3;  grossly normal neurologically. Skin:  Intact without significant lesions or rashes. No jaundice. Psych:  Alert and cooperative.  Irritable  Imaging Studies: No abdominal imaging  Assessment and Plan:   Casey Franco is a 63 y.o. African-American male with history of hypertension, diabetes, ESRD on hemodialysis, chronic hep C genotype 1b, previously treated with Mavyret, unknown SVR, unfortunately progressed to decompensated cirrhosis of liver with ascites complicated by spontaneous bacterial peritonitis in 11/2023 as well as gram-negative bacteremia, complicated by A-fib with RVR on Eliquis   Decompensated cirrhosis of liver with chronic hep C, genotype 1b S/p treatment with Mavyret, unknown SVR Check HCV RNA viral load, LFTs, PT/NR Recommend diagnostic and therapeutic paracentesis, fluid analysis Recommend EGD for variceal screening given decompensation since last EGD in 2023 Immune to hepatitis B and A Check serum AFP levels, imaging did not reveal any liver lesions Recommend referral to [facility], transplant hepatology  Spontaneous bacterial peritonitis Continue secondary prophylaxis with ciprofloxacin Ultrasound paracentesis as above  Normocytic anemia: Anemia of chronic kidney disease No evidence of iron deficiency  Follow up in 3 months   Arlyss Repress, MD

## 2023-12-31 NOTE — Patient Instructions (Addendum)
 I got your Ultrasound Paracentesis schedule for you for 01/05/2024 arrive at 10:00am for 10:30am check in at medical mall at the registration desk. They will do the Ultrasound RUQ right after the Paracentesis. Nothing to eat or drink after midnight.  Placed referral to [facility] transplant and they will call you to set up a appointment.

## 2023-12-31 NOTE — Telephone Encounter (Signed)
 Patient with diagnosis of A Fib on Eliquis for anticoagulation.    Procedure: EGD Date of procedure: 02/18/24   CHA2DS2-VASc Score = 3  This indicates a 3.2% annual risk of stroke. The patient's score is based upon: CHF History: 1 HTN History: 1 Diabetes History: 1 Stroke History: 0 Vascular Disease History: 0 Age Score: 0 Gender Score: 0   CrCl 10 ml/min Platelet count 246K   Per office protocol, patient can hold Eliquis for 2 days prior to procedure.     **This guidance is not considered finalized until pre-operative APP has relayed final recommendations.**

## 2023-12-31 NOTE — Telephone Encounter (Signed)
     Primary Cardiologist: None  Clinical pharmacist have reviewed chart  as part of pre-operative protocol coverage.  The following recommendations were provided for , Casey Franco:  Patient with diagnosis of A Fib on Eliquis for anticoagulation.     Procedure: EGD Date of procedure: 02/18/24     CHA2DS2-VASc Score = 3  This indicates a 3.2% annual risk of stroke. The patient's score is based upon: CHF History: 1 HTN History: 1 Diabetes History: 1 Stroke History: 0 Vascular Disease History: 0 Age Score: 0 Gender Score: 0   CrCl 10 ml/min Platelet count 246K     Per office protocol, patient can hold Eliquis for 2 days prior to procedure.  I will route this recommendation to the requesting party via Epic fax function and remove from pre-op pool.  Please call with questions.  [name]. [name] NP-C     12/31/2023, 3:27 PM [name] [address] Office [phone] Fax [phone]

## 2024-01-02 LAB — PROTIME-INR
INR: 1.2 (ref 0.9–1.2)
Prothrombin Time: 13.8 s — ABNORMAL HIGH (ref 9.1–12.0)

## 2024-01-02 LAB — HEPATIC FUNCTION PANEL
ALT: 11 IU/L (ref 0–44)
AST: 20 IU/L (ref 0–40)
Albumin: 2.8 g/dL — ABNORMAL LOW (ref 3.9–4.9)
Alkaline Phosphatase: 211 IU/L — ABNORMAL HIGH (ref 44–121)
Bilirubin Total: 1 mg/dL (ref 0.0–1.2)
Bilirubin, Direct: 0.75 mg/dL — ABNORMAL HIGH (ref 0.00–0.40)
Total Protein: 6.9 g/dL (ref 6.0–8.5)

## 2024-01-02 LAB — HCV RNA QUANT: Hepatitis C Quantitation: NOT DETECTED [IU]/mL

## 2024-01-02 LAB — AFP TUMOR MARKER: AFP, Serum, Tumor Marker: <1.8 ng/mL (ref 0.0–8.4)

## 2024-01-04 ENCOUNTER — Telehealth: Payer: Self-pay

## 2024-01-04 NOTE — Telephone Encounter (Signed)
-----   Message from [facility] sent at 01/04/2024  9:58 AM EDT ----- Please inform patient, I am pleased to see that his hepatitis C viral load is undetected which means the infection is eradicated.  Blood work results look reassuring  RV

## 2024-01-04 NOTE — Telephone Encounter (Signed)
Tried to call patient but mailbox is full  

## 2024-01-04 NOTE — Telephone Encounter (Signed)
 Placed referral to [facility] center. Faxed with referral labs, last office visit, Demographics, insurance, medication list. Got confirmation fax went through

## 2024-01-04 NOTE — Telephone Encounter (Signed)
 Informed patient and mailed out new instructions to patient

## 2024-01-05 ENCOUNTER — Ambulatory Visit
Admission: RE | Admit: 2024-01-05 | Discharge: 2024-01-05 | Disposition: A | Discharge status: Home / Self Care | Source: Ambulatory Visit | Attending: Gastroenterology | Admitting: Gastroenterology

## 2024-01-05 VITALS — BP 121/90 | HR 117 | Temp 97.7°F | Resp 14

## 2024-01-05 DIAGNOSIS — K824 Cholesterolosis of gallbladder: Secondary | ICD-10-CM | POA: Insufficient documentation

## 2024-01-05 DIAGNOSIS — R188 Other ascites: Secondary | ICD-10-CM | POA: Diagnosis present

## 2024-01-05 DIAGNOSIS — K7460 Unspecified cirrhosis of liver: Secondary | ICD-10-CM | POA: Insufficient documentation

## 2024-01-05 DIAGNOSIS — B182 Chronic viral hepatitis C: Secondary | ICD-10-CM | POA: Insufficient documentation

## 2024-01-05 LAB — BODY FLUID CELL COUNT WITH DIFFERENTIAL
Eos, Fluid: 0 %
Lymphs, Fluid: 55 %
Monocyte-Macrophage-Serous Fluid: 14 %
Neutrophil Count, Fluid: 31 %
Total Nucleated Cell Count, Fluid: 24196 uL

## 2024-01-05 MED ORDER — ALBUMIN HUMAN 25 % IV SOLN
INTRAVENOUS | Status: AC
  Filled 2024-01-05: qty 100

## 2024-01-05 MED ORDER — LIDOCAINE HCL (PF) 1 % IJ SOLN
10.0000 mL | Freq: Once | INTRAMUSCULAR | Status: AC
Start: 2024-01-05 — End: 2024-01-05
  Administered 2024-01-05: 10 mL
  Filled 2024-01-05: qty 10

## 2024-01-05 MED ORDER — ALBUMIN HUMAN 25 % IV SOLN
25.0000 g | Freq: Once | INTRAVENOUS | Status: AC
  Administered 2024-01-05: 25 g via INTRAVENOUS

## 2024-01-05 NOTE — Telephone Encounter (Signed)
Tried to call patient but mailbox is full  

## 2024-01-05 NOTE — Procedures (Signed)
 Interventional Radiology Procedure Note  Procedure: Image guided US  Findings: Recurrence of cystic ascites in the mid abdomen.  Possible pseudocyst, based on prior CT appearance.   Complications: None   Sample: Culture sent, additional labs as ordered, TG's  Recommendations:   - routine wound care  Signed,  Yvone Neu. Loreta Ave, DO

## 2024-01-06 LAB — ALBUMIN, FLUID (OTHER): Albumin, Body Fluid Other: 0.4 g/dL

## 2024-01-06 LAB — TRIGLYCERIDES, BODY FLUIDS: Triglycerides, Fluid: 147 mg/dL

## 2024-01-06 LAB — CYTOLOGY - NON PAP

## 2024-01-06 LAB — PROTEIN, BODY FLUID (OTHER): Total Protein, Body Fluid Other: 1 g/dL

## 2024-01-06 NOTE — Telephone Encounter (Signed)
 Tried to call patient but mailbox is full sent a letter to patient. Due to unable to reach patient.

## 2024-01-07 ENCOUNTER — Telehealth: Payer: Self-pay

## 2024-01-07 DIAGNOSIS — K631 Perforation of intestine (nontraumatic): Secondary | ICD-10-CM

## 2024-01-07 LAB — ACID FAST SMEAR (AFB, MYCOBACTERIA): Acid Fast Smear: NEGATIVE

## 2024-01-07 LAB — BODY FLUID CULTURE W GRAM STAIN: Gram Stain: NONE SEEN

## 2024-01-07 MED ORDER — CIPROFLOXACIN HCL 500 MG PO TABS: 500.0000 mg | ORAL_TABLET | Freq: Every day | ORAL | 0 refills | Status: DC

## 2024-01-07 MED ORDER — METRONIDAZOLE 500 MG PO TABS: 500.0000 mg | ORAL_TABLET | Freq: Three times a day (TID) | ORAL | 0 refills | Status: DC

## 2024-01-07 NOTE — Telephone Encounter (Signed)
 Per Dr. Allegra Lai Also, start him on metronidazole 500 mg tid, he should be taking Cipro 500 mg daily.  Asked Dr Allegra Lai for how long and she states 14 days.  Sent medications to the pharmacy

## 2024-01-07 NOTE — Telephone Encounter (Signed)
 Per Dr. Allegra Lai And urgent referral to Dr Aleen Campi placed referral

## 2024-01-07 NOTE — Telephone Encounter (Signed)
-----   Message from [facility] sent at 01/07/2024  2:51 PM EDT ----- Morrie Sheldon, please call the patient and inform him that the fluid that was removed is concerning for ongoing infection and I recommend stat CT abdomen and pelvis with and without contrast to rule out any bowel perforation  Probably, you may have to call his sister on his chart  RV

## 2024-01-07 NOTE — Telephone Encounter (Signed)
 For CT scan abdominal and pelvis with contrast Submit Clinical Request  This member's benefit plan did not require a prior authorization for this request.   Please print and retain a copy of this confirmation in the patient's medical record.   Physician Name: Lannette Donath Contact: [facility] Physician Address: [address] [place], [place] [id number] Phone Number: [phone]  Fax Number: [phone] Patient Name: Casey Franco Patient Id: [id number] Insurance Carrier: [employer]MEDICARE Primary Diagnosis Code: K63.1 Description: Perforation of intestine (nontraumatic) Secondary Diagnosis Code:  Description:  CPT Code 95284 Description: CT ABDOMEN & PELVIS W/ Case Number: [id number] Review Date: 01/07/2024 3:23:41 PM Expiration Date: N/A Status: This member's benefit plan did not require a prior authorization for this request.

## 2024-01-07 NOTE — Telephone Encounter (Signed)
 Tried to call patient but it rang once and then went to voicemail and voicemail is full.

## 2024-01-07 NOTE — Telephone Encounter (Addendum)
 Called patient sister bobbi and unable to leave a message because voicemail is full. Patient does not have a sign DPR so will just have to tell sister to tell the patient to call us.

## 2024-01-08 ENCOUNTER — Telehealth: Payer: Self-pay

## 2024-01-08 NOTE — Telephone Encounter (Signed)
 Tried reaching patient at this time 8:31 am --unable to leave message- mailbox is full. Need to order CT abdomen /pelvis with and without contrast-rule out bowel perforation.

## 2024-01-08 NOTE — Telephone Encounter (Signed)
error 

## 2024-01-11 NOTE — Telephone Encounter (Signed)
Left message for patient to return call to office at this time. 

## 2024-01-12 NOTE — Telephone Encounter (Signed)
 Tried to call patient but voicemail is full unable to reach patient. Tried to call sister unable to leave sister a voicemail because sister voicemail is full

## 2024-01-18 MED ORDER — CIPROFLOXACIN HCL 500 MG PO TABS: 500.0000 mg | ORAL_TABLET | Freq: Every day | ORAL | 0 refills | Status: DC

## 2024-01-18 MED ORDER — METRONIDAZOLE 500 MG PO TABS
500.0000 mg | ORAL_TABLET | Freq: Three times a day (TID) | ORAL | 0 refills | Status: AC
Start: 2024-01-18 — End: 2024-02-01

## 2024-01-18 NOTE — Addendum Note (Signed)
 Addended by: Radene Knee L on: 01/18/2024 09:15 AM   Modules accepted: Orders

## 2024-01-18 NOTE — Telephone Encounter (Signed)
 Patient nephrologist called and states she saw we have been trying to call the patient. She states she informed patient we have been calling patient. She states she does not use [facility] and they use [facility]. Resent antibiotics to [facility]. She states he is willing to do the CT scan. Order the CT scan and STAT. Called and then the patient states he can not come this afternoon and not tomorrow he states he wants to call to make the appointment. Gave him the number to make the STAT CT scan. He has appointment with General surgery on 01/21/2024

## 2024-01-21 ENCOUNTER — Encounter: Payer: Self-pay | Admitting: General Surgery

## 2024-01-21 ENCOUNTER — Ambulatory Visit (INDEPENDENT_AMBULATORY_CARE_PROVIDER_SITE_OTHER): Admitting: General Surgery

## 2024-01-21 ENCOUNTER — Ambulatory Visit
Admission: RE | Admit: 2024-01-21 | Discharge: 2024-01-21 | Disposition: A | Discharge status: Home / Self Care | Source: Ambulatory Visit | Attending: Gastroenterology | Admitting: Gastroenterology

## 2024-01-21 VITALS — BP 120/75 | HR 128 | Temp 98.0°F | Ht 72.0 in | Wt 130.8 lb

## 2024-01-21 DIAGNOSIS — Z992 Dependence on renal dialysis: Secondary | ICD-10-CM | POA: Diagnosis not present

## 2024-01-21 DIAGNOSIS — B1920 Unspecified viral hepatitis C without hepatic coma: Secondary | ICD-10-CM | POA: Diagnosis not present

## 2024-01-21 DIAGNOSIS — K7460 Unspecified cirrhosis of liver: Secondary | ICD-10-CM

## 2024-01-21 DIAGNOSIS — K631 Perforation of intestine (nontraumatic): Secondary | ICD-10-CM | POA: Diagnosis present

## 2024-01-21 DIAGNOSIS — N186 End stage renal disease: Secondary | ICD-10-CM | POA: Diagnosis not present

## 2024-01-21 DIAGNOSIS — R188 Other ascites: Secondary | ICD-10-CM | POA: Diagnosis not present

## 2024-01-21 MED ORDER — IOHEXOL 300 MG/ML  SOLN
100.0000 mL | Freq: Once | INTRAMUSCULAR | Status: AC | PRN
Start: 2024-01-21 — End: 2024-01-21
  Administered 2024-01-21: 100 mL via INTRAVENOUS

## 2024-01-21 NOTE — Patient Instructions (Signed)
 Long-Term Liver Injury (Cirrhosis): What to Know  Cirrhosis is a long-term (chronic) liver injury. The liver is an organ in your body that has many jobs. Your liver: Changes food into energy. Gets rid of toxic things in your blood. Makes proteins. Absorbs vitamins from food. If you have cirrhosis, scar tissue takes the place of your healthy liver cells. This stops blood from flowing through your liver. It makes it hard for your liver to do what it needs to do. What are the causes? Cirrhosis may be caused by: Hepatitis C. Drinking a lot of alcohol for a long time. Diseases, such as: Nonalcoholic fatty liver disease (NAFLD). It's also called metabolic dysfunction-associated steatotic liver disease (MASLD). Diseases that block your liver ducts. Liver diseases you were born with. Hepatitis B. Autoimmune hepatitis. This is when your body's defense system (immune system) attacks your liver cells by mistake. An infection with a parasite. A reaction to things you've been exposed to for a long time, such as: Certain medicines you take. These include heart medicines. Certain toxins. These include organic solvents. What increases the risk? You're more likely to get cirrhosis if: You have hepatitis. You drink a lot of alcohol for a long time. You're overweight. You're male. You're over 75 years old. You use IV drugs and share needles. You have sex without protection, and the other person has hepatitis. What are the signs or symptoms? You may not have symptoms at first. Symptoms may start when the damage to your liver gets worse. Early symptoms may include: Feeling weak and tired. Trouble sleeping. Itchy skin. Your belly feeling tender. Losing weight or not getting as hungry as normal. Feeling like you may throw up. Muscle loss and cramps. Later symptoms may include: Jaundice. This is when your skin or the white parts of your eyes turn yellow. A buildup of fluid in your belly. Your  clothes may fit tight around your waist. Weight gain and swelling of your feet and ankles. Trouble breathing. Easy bruising and bleeding. Throwing up blood. Black or bloody poop. Feeling confused. How is this diagnosed? Cirrhosis may be diagnosed based on your symptoms, medical history, and an exam. Your health care provider may feel your liver. You may also need tests. These may include: Blood tests to check: For hepatitis B or C. How well your kidney works. How well your liver works. Imaging tests, such as: An MRI or CT scan. An ultrasound. This can check if your liver tissue is being replaced by scar tissue. How is this treated? Treatment may depend on how damaged your liver is and what caused the damage. It may mean treating your symptoms or treating the problems that caused the cirrhosis. Treatment may include: Changes to what you eat and drink. You may need to: Eat healthy. Work with your provider to make an eating plan. Not take in as much salt. Stay at a healthy weight. Not use drugs or alcohol. Taking medicines to: Treat infections. Help with itching. Reduce fluid buildup. Reduce certain toxins in your blood. Lower your risk of bleeding. Getting a liver transplant. This is done if you have liver failure. Follow these instructions at home: Take your medicines only as told. Do not take medicines that are bad for your liver. Ask your provider before taking any new medicines. Ask before you take acetaminophen. Rest as needed. Eat a healthy diet. Eat smaller meals every 3-4 hours. Limit your salt or water intake as told. Avoid: Raw or undercooked shellfish, fish, and meat. Unpasteurized  milk and milk products. Do not drink alcohol. Keep all follow-up visits. Your provider will check if you're getting better. Contact a health care provider if: You have tiredness or weakness that's getting worse. You have swelling in: Your hands. Your feet. Your legs. Fluid builds  up in your belly. You have a fever or chills. You lose or gain weight. You throw up or feel like you may throw up. You get jaundice. You start to bruise or bleed easily. Get help right away if: You throw up, and it looks like: Bright red blood. Coffee grounds. Your poop looks bloody or black. You get confused. You have chest pain or trouble breathing. These symptoms may be an emergency. Call 911 right away. Do not wait to see if the symptoms will go away. Do not drive yourself to the hospital. This information is not intended to replace advice given to you by your health care provider. Make sure you discuss any questions you have with your health care provider. Document Revised: 03/20/2023 Document Reviewed: 03/20/2023 Elsevier Patient Education  2024 [employer].

## 2024-01-21 NOTE — Progress Notes (Signed)
 Patient ID: Casey Franco, male   DOB: 11/01/1960, 63 y.o.   MRN: [id number] CC: Concern fro Bowel Perforation  History of Present Illness Casey Franco is a 63 y.o. 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  The patient reports that is 2 years ago he was diagnosed with hepatitis C.  However he was lost to follow-up.  He represented to the hospital in February of this year and was found to have ascites and spontaneous bacterial peritonitis.  He was treated with paracentesis which she reports that they got admitted to the liters of fluid off and he started to feel better.  He was discharged on prophylactic antibiotics.  He reports that since his hospitalization he has been doing well.  He is tolerating a regular diet and he is having bowel function.  H his only abdominal pain that he reports is mild pain at the site where they inserted the needle for paracentesis.  Denies any fevers or chills.  He does have an appointment with [facility] hepatology in May Past Medical History Past Medical History:  Diagnosis Date   AKI (acute kidney injury) (HCC) 08/30/2018   Diabetes mellitus without complication (HCC)    no meds since weight loss   Dialysis patient (HCC)    Mon, Wed, Fri   Hyperkalemia 02/04/2021   Hypertension    Metabolic acidosis, increased anion gap 02/04/2021   Renal disorder    Sepsis without acute organ dysfunction (HCC) 11/30/2023       Past Surgical History:  Procedure Laterality Date   CATARACT EXTRACTION W/PHACO Right 10/08/2023   Procedure: CATARACT EXTRACTION PHACO AND INTRAOCULAR LENS PLACEMENT (IOC) RIGHT DIABETIC 12.19 01:08.8;  Surgeon: Estanislado Pandy, MD;  Location: [facility];  Service: Ophthalmology;  Laterality: Right;   CATARACT EXTRACTION W/PHACO Left 10/22/2023   Procedure: CATARACT EXTRACTION  PHACO AND INTRAOCULAR LENS PLACEMENT (IOC) LEFT DIABETIC MALYUGIN;  Surgeon: Estanislado Pandy, MD;  Location: [facility] SUR[facility]ERY [facility];  Service: Ophthalmology;  Laterality: Left;  7.66 0:54.2   COLONOSCOPY WITH PROPOFOL N/A 11/21/2021   Procedure: COLONOSCOPY WITH PROPOFOL;  Surgeon: Toney Reil, MD;  Location: [facility] ENDOSCOPY;  Service: Gastroenterology;  Laterality: N/A;   DIALYSIS/PERMA CATHETER REPAIR N/A 12/15/2023   Procedure: DIALYSIS/PERMA CATHETER REPAIR;  Surgeon: Renford Dills, MD;  Location: [facility] INVASIVE CV LAB;  Service: Cardiovascular;  Laterality: N/A;   ESOPHAGOGASTRODUODENOSCOPY (EGD) WITH PROPOFOL N/A 11/21/2021   Procedure: ESOPHAGOGASTRODUODENOSCOPY (EGD) WITH PROPOFOL;  Surgeon: Toney Reil, MD;  Location: [facility] ENDOSCOPY;  Service: Gastroenterology;  Laterality: N/A;   INSERTION OF DIALYSIS CATHETER     LEG SURGERY  1987    No Known Allergies  Current Outpatient Medications  Medication Sig Dispense Refill   albumin human 25 % bottle Inject 25 grams no matter what if more then 5L are removed then another 25 grams     apixaban (ELIQUIS) 2.5 MG TABS tablet Take 1 tablet (2.5 mg total) by mouth 2 (two) times daily. 180 tablet 0   blood glucose meter kit and supplies KIT Dispense based on patient and insurance preference. Use up to four times daily as directed. (FOR ICD-9 250.00, 250.01). 1 each 0   calcium acetate (PHOSLO) 667 MG capsule Take 2 capsules (1,334 mg total) by mouth 3 (three) times a week. 72 capsule 0   ciprofloxacin (CIPRO) 250 MG tablet Take  1 tablet (250 mg total) by mouth at bedtime. 90 tablet 0   ciprofloxacin (CIPRO) 500 MG tablet Take 1 tablet (500 mg total) by mouth daily. 14 tablet 0   epoetin alfa-epbx (RETACRIT) 45409 UNIT/ML injection Inject 1 mL (10,000 Units total) into the vein every Monday, Wednesday, and Friday with hemodialysis.     feeding supplement (ENSURE ENLIVE / ENSURE PLUS) LIQD Take 237 mLs by mouth 3 (three) times daily  between meals.     irbesartan (AVAPRO) 300 MG tablet Take 1 tablet (300 mg total) by mouth daily. 90 tablet 0   lactulose (CHRONULAC) 10 GM/15ML solution Take 30 mLs (20 g total) by mouth daily. 2700 mL 0   melatonin 5 MG TABS Take 1 tablet (5 mg total) by mouth at bedtime as needed. 20 tablet 0   metoprolol tartrate (LOPRESSOR) 25 MG tablet Take 1 tablet (25 mg total) by mouth 2 (two) times daily. 180 tablet 0   metroNIDAZOLE (FLAGYL) 500 MG tablet Take 1 tablet (500 mg total) by mouth 3 (three) times daily for 14 days. 42 tablet 0   Multiple Vitamin (DAILY-VITE MULTIVITAMIN) TABS Take 1 tablet by mouth at bedtime. 100 tablet 0   tamsulosin (FLOMAX) 0.4 MG CAPS capsule Take 1 capsule (0.4 mg total) by mouth daily. 90 capsule 0   VITAMIN E PO Take by mouth.     No current facility-administered medications for this visit.    Family History Family History  Problem Relation Age of Onset   Diabetes Mellitus II Sister    Diabetes Mellitus II Paternal Grandmother        Social History Social History   Tobacco Use   Smoking status: Former    Types: Cigarettes   Smokeless tobacco: Never  Vaping Use   Vaping status: Never Used  Substance Use Topics   Alcohol use: Yes    Alcohol/week: 7.0 standard drinks of alcohol    Types: 7 Cans of beer per week   Drug use: Not Currently    Types: Cocaine, Marijuana    Comment: 09/24/23 - Marijuana 3x/week. No cocaine since Friday 08/27/2018        ROS Full ROS of systems performed and is otherwise negative there than what is stated in the HPI  Physical Exam Blood pressure 120/75, pulse (!) 128, temperature 98 F (36.7 C), temperature source Oral, height 6' (1.829 m), weight 130 lb 12.8 oz (59.3 kg), SpO2 92%.  Alert and oriented x 3, normal work of breathing on room air, walking with a walker, abdomen is soft, nontender, in the central abdomen there is a palpable fluid collection.  There is no fluid shift or striae on his abdomen.  Data  Reviewed I reviewed his labs consistent with a normal INR, normal bilirubin he does have a low albumin.  I reviewed his CT scan from when he was in the hospital there is a large amount of ascites with a central fluid collection.  I rereviewed his recent CT scan that shows some ascites with a loculated pocket of ascites.  I reviewed his paracentesis results with the SAG gradient consistent with portal hypertension.  I have personally reviewed the patient's imaging and medical records.    Assessment/plan    The patient is a 63 year old with multiple medical comorbidities who is seen in consultation for concern for bowel perforation.  He had a paracentesis that had green fluid in it.  He is having no abdominal pain and tolerating a diet and having bowel function.  On CT scan he does have a loculated fluid collection.  I discussed with him that he will likely need future paracenteses to withdraw fluid if he becomes symptomatic.  At this time there is no concern for bowel perforation and I would not entertain any surgical resection of his loculated fluid collection.  He is set to see [facility] hepatology in May and I encouraged him to follow-up with [place] appointment.  He can follow-up with our office on an as-needed basis as no surgical intervention is warranted at this time     Kandis Cocking 01/21/2024, 11:42 AM

## 2024-02-18 ENCOUNTER — Telehealth: Payer: Self-pay

## 2024-02-18 ENCOUNTER — Ambulatory Visit
Admission: RE | Admit: 2024-02-18 | Discharge: 2024-02-18 | Disposition: A | Discharge status: Home / Self Care | Attending: Gastroenterology | Admitting: Gastroenterology

## 2024-02-18 ENCOUNTER — Encounter
Admission: RE | Disposition: A | Discharge status: Home / Self Care | Payer: Self-pay | Source: Home / Self Care | Attending: Gastroenterology

## 2024-02-18 ENCOUNTER — Other Ambulatory Visit: Payer: Self-pay

## 2024-02-18 DIAGNOSIS — Z539 Procedure and treatment not carried out, unspecified reason: Secondary | ICD-10-CM | POA: Diagnosis not present

## 2024-02-18 DIAGNOSIS — I8500 Esophageal varices without bleeding: Secondary | ICD-10-CM | POA: Diagnosis present

## 2024-02-18 DIAGNOSIS — I8510 Secondary esophageal varices without bleeding: Secondary | ICD-10-CM

## 2024-02-18 DIAGNOSIS — Z7901 Long term (current) use of anticoagulants: Secondary | ICD-10-CM | POA: Diagnosis not present

## 2024-02-18 LAB — ACID FAST CULTURE WITH REFLEXED SENSITIVITIES (MYCOBACTERIA): Acid Fast Culture: NEGATIVE

## 2024-02-18 SURGERY — EGD (ESOPHAGOGASTRODUODENOSCOPY)
Anesthesia: General

## 2024-02-18 NOTE — Telephone Encounter (Signed)
 Got a message from [place] and she states patient took eliquis  and ate this am will cancel and will need to be rescheduled. if you don't mind calling him.  Called patient and reschedule patient to 03/03/2024. Informed patient he could not have anything to eat or drink after midnight and he needed to stop his Eliquis  2 days before his procedure. He verbalized understanding.  Called Endo and talk to [facility] and she will get patient moved to 03/03/2024

## 2024-02-18 NOTE — Progress Notes (Signed)
 Patient here for EGD for varices. Says he didn't stop his Eliquis , took it this morning and ate breakfast this morning. He would like to reschedule and said he didn't know anything about not eating and not stopping his Eliquis . "I don't have any idea why I am here." Educated him on the need to be NPO before anesthesia and following the MD order for amount of time to stop the Eliquis . Also educated on what procedure he is scheduled for..He agreed. His sister is aware and discharged pt.

## 2024-02-25 ENCOUNTER — Encounter: Payer: Self-pay | Admitting: Gastroenterology

## 2024-03-03 ENCOUNTER — Other Ambulatory Visit: Payer: Self-pay

## 2024-03-03 ENCOUNTER — Ambulatory Visit

## 2024-03-03 ENCOUNTER — Encounter
Admission: RE | Disposition: A | Discharge status: Home / Self Care | Payer: Self-pay | Source: Home / Self Care | Attending: Gastroenterology

## 2024-03-03 ENCOUNTER — Ambulatory Visit
Admission: RE | Admit: 2024-03-03 | Discharge: 2024-03-03 | Disposition: A | Discharge status: Home / Self Care | Attending: Gastroenterology | Admitting: Gastroenterology

## 2024-03-03 ENCOUNTER — Encounter: Payer: Self-pay | Admitting: Gastroenterology

## 2024-03-03 DIAGNOSIS — Z87891 Personal history of nicotine dependence: Secondary | ICD-10-CM | POA: Diagnosis not present

## 2024-03-03 DIAGNOSIS — K7460 Unspecified cirrhosis of liver: Secondary | ICD-10-CM | POA: Insufficient documentation

## 2024-03-03 DIAGNOSIS — I509 Heart failure, unspecified: Secondary | ICD-10-CM | POA: Insufficient documentation

## 2024-03-03 DIAGNOSIS — I132 Hypertensive heart and chronic kidney disease with heart failure and with stage 5 chronic kidney disease, or end stage renal disease: Secondary | ICD-10-CM | POA: Diagnosis not present

## 2024-03-03 DIAGNOSIS — K3189 Other diseases of stomach and duodenum: Secondary | ICD-10-CM | POA: Diagnosis not present

## 2024-03-03 DIAGNOSIS — K219 Gastro-esophageal reflux disease without esophagitis: Secondary | ICD-10-CM | POA: Diagnosis not present

## 2024-03-03 DIAGNOSIS — K2289 Other specified disease of esophagus: Secondary | ICD-10-CM | POA: Diagnosis not present

## 2024-03-03 DIAGNOSIS — E1122 Type 2 diabetes mellitus with diabetic chronic kidney disease: Secondary | ICD-10-CM | POA: Diagnosis not present

## 2024-03-03 DIAGNOSIS — N186 End stage renal disease: Secondary | ICD-10-CM | POA: Diagnosis not present

## 2024-03-03 DIAGNOSIS — Z992 Dependence on renal dialysis: Secondary | ICD-10-CM | POA: Insufficient documentation

## 2024-03-03 DIAGNOSIS — K449 Diaphragmatic hernia without obstruction or gangrene: Secondary | ICD-10-CM | POA: Diagnosis not present

## 2024-03-03 DIAGNOSIS — I8510 Secondary esophageal varices without bleeding: Secondary | ICD-10-CM

## 2024-03-03 DIAGNOSIS — K319 Disease of stomach and duodenum, unspecified: Secondary | ICD-10-CM | POA: Diagnosis present

## 2024-03-03 HISTORY — PX: ESOPHAGOGASTRODUODENOSCOPY: SHX5428

## 2024-03-03 SURGERY — EGD (ESOPHAGOGASTRODUODENOSCOPY)
Anesthesia: General

## 2024-03-03 MED ORDER — PROPOFOL 1000 MG/100ML IV EMUL
INTRAVENOUS | Status: AC
Start: 2024-03-03 — End: ?
  Filled 2024-03-03: qty 100

## 2024-03-03 MED ORDER — PROPOFOL 500 MG/50ML IV EMUL
INTRAVENOUS | Status: DC | PRN
  Administered 2024-03-03: 75 ug/kg/min via INTRAVENOUS

## 2024-03-03 MED ORDER — PROPOFOL 10 MG/ML IV BOLUS
INTRAVENOUS | Status: AC
  Filled 2024-03-03: qty 20

## 2024-03-03 MED ORDER — GLYCOPYRROLATE 0.2 MG/ML IJ SOLN
INTRAMUSCULAR | Status: DC | PRN
Start: 2024-03-03 — End: 2024-03-03
  Administered 2024-03-03: .1 mg via INTRAVENOUS

## 2024-03-03 MED ORDER — PROPOFOL 10 MG/ML IV BOLUS
INTRAVENOUS | Status: DC | PRN
  Administered 2024-03-03: 40 mg via INTRAVENOUS

## 2024-03-03 MED ORDER — GLYCOPYRROLATE 0.2 MG/ML IJ SOLN
INTRAMUSCULAR | Status: AC
  Filled 2024-03-03: qty 1

## 2024-03-03 MED ORDER — LIDOCAINE HCL (CARDIAC) PF 100 MG/5ML IV SOSY
PREFILLED_SYRINGE | INTRAVENOUS | Status: DC | PRN
Start: 2024-03-03 — End: 2024-03-03
  Administered 2024-03-03: 50 mg via INTRAVENOUS

## 2024-03-03 MED ORDER — LIDOCAINE HCL (PF) 2 % IJ SOLN
INTRAMUSCULAR | Status: AC
Start: 2024-03-03 — End: ?
  Filled 2024-03-03: qty 5

## 2024-03-03 MED ORDER — SODIUM CHLORIDE 0.9 % IV SOLN
INTRAVENOUS | Status: DC
Start: 2024-03-03 — End: 2024-03-03

## 2024-03-03 NOTE — Anesthesia Postprocedure Evaluation (Signed)
 Anesthesia Post Note  Patient: Casey Franco  Procedure(s) Performed: EGD (ESOPHAGOGASTRODUODENOSCOPY)  Patient location during evaluation: Endoscopy Anesthesia Type: General Level of consciousness: awake and alert Pain management: pain level controlled Vital Signs Assessment: post-procedure vital signs reviewed and stable Respiratory status: spontaneous breathing, nonlabored ventilation, respiratory function stable and patient connected to nasal cannula oxygen Cardiovascular status: blood pressure returned to baseline and stable Postop Assessment: no apparent nausea or vomiting Anesthetic complications: no  No notable events documented.   Last Vitals:  Vitals:   03/03/24 1100 03/03/24 1110  BP: (!) 128/93 105/73  Pulse: (!) 118 (!) 118  Resp: (!) 26 (!) 25  Temp:    SpO2:  100%    Last Pain:  Vitals:   03/03/24 1110  TempSrc:   PainSc: 0-No pain                 Enrique Harvest

## 2024-03-03 NOTE — Transfer of Care (Signed)
 Immediate Anesthesia Transfer of Care Note  Patient: Casey Franco  Procedure(s) Performed: EGD (ESOPHAGOGASTRODUODENOSCOPY)  Patient Location: PACU  Anesthesia Type:General  Level of Consciousness: drowsy and patient cooperative  Airway & Oxygen Therapy: Patient Spontanous Breathing  Post-op Assessment: Report given to RN and Post -op Vital signs reviewed and stable  Post vital signs: stable  Last Vitals:  Vitals Value Taken Time  BP    Temp    Pulse    Resp    SpO2      Last Pain:  Vitals:   03/03/24 0919  TempSrc: Temporal  PainSc: 0-No pain         Complications: No notable events documented.

## 2024-03-03 NOTE — Op Note (Signed)
 [facility] Gastroenterology Patient Name: Casey Franco Procedure Date: 03/03/2024 10:25 AM MRN: [id number] Account #: [id number] Date of Birth: [birth year] Admit Type: Outpatient Age: 63 Room: [facility] ENDO ROOM 3 Gender: Male Note Status: Finalized Instrument Name: Upper Endoscope [id number] Procedure:             Upper GI endoscopy Indications:           Cirrhosis rule out esophageal varices Providers:             Selena Daily MD, MD Medicines:             General Anesthesia Complications:         No immediate complications. Estimated blood loss: None. Procedure:             Pre-Anesthesia Assessment:                        - Prior to the procedure, a History and Physical was                         performed, and patient medications and allergies were                         reviewed. The patient is competent. The risks and                         benefits of the procedure and the sedation options and                         risks were discussed with the patient. All questions                         were answered and informed consent was obtained.                         Patient identification and proposed procedure were                         verified by the physician, the nurse, the                         anesthesiologist, the anesthetist and the technician                         in the pre-procedure area in the procedure room in the                         endoscopy suite. Mental Status Examination: alert and                         oriented. Airway Examination: normal oropharyngeal                         airway and neck mobility. Respiratory Examination:                         clear to auscultation. CV Examination: normal.  Prophylactic Antibiotics: The patient does not require                         prophylactic antibiotics. Prior Anticoagulants: The                         patient has taken no anticoagulant or antiplatelet                          agents. ASA Grade Assessment: III - A patient with                         severe systemic disease. After reviewing the risks and                         benefits, the patient was deemed in satisfactory                         condition to undergo the procedure. The anesthesia                         plan was to use general anesthesia. Immediately prior                         to administration of medications, the patient was                         re-assessed for adequacy to receive sedatives. The                         heart rate, respiratory rate, oxygen saturations,                         blood pressure, adequacy of pulmonary ventilation, and                         response to care were monitored throughout the                         procedure. The physical status of the patient was                         re-assessed after the procedure.                        After obtaining informed consent, the endoscope was                         passed under direct vision. Throughout the procedure,                         the patient's blood pressure, pulse, and oxygen                         saturations were monitored continuously. The Endoscope                         was introduced through the mouth, and advanced to the  second part of duodenum. The upper GI endoscopy was                         accomplished without difficulty. The patient tolerated                         the procedure well. Findings:      The duodenal bulb and second portion of the duodenum were normal.      Diffuse moderately erythematous mucosa without bleeding was found in the       gastric body and in the gastric antrum. Biopsies were taken with a cold       forceps for histology.      A medium-sized hiatal hernia was present.      Mild mucosal changes characterized by granularity were found at the       gastroesophageal junction. Biopsies were taken with a cold forceps  for       histology.      The examined esophagus was normal.      The cardia and gastric fundus were normal on retroflexion. Impression:            - Normal duodenal bulb and second portion of the                         duodenum.                        - Erythematous mucosa in the gastric body and antrum.                         Biopsied.                        - Medium-sized hiatal hernia.                        - Granular mucosa in the esophagus. Biopsied.                        - Normal esophagus. Recommendation:        - Await pathology results.                        - Discharge patient to home (with escort).                        - Resume previous diet today.                        - Continue present medications. Procedure Code(s):     --- Professional ---                        [phone], Esophagogastroduodenoscopy, flexible,                         transoral; with biopsy, single or multiple Diagnosis Code(s):     --- Professional ---                        K31.89, Other diseases of stomach and duodenum  K22.89, Other specified disease of esophagus                        K74.60, Unspecified cirrhosis of liver                        K44.9, Diaphragmatic hernia without obstruction or                         gangrene CPT copyright 2022 American Medical Association. All rights reserved. The codes documented in this report are preliminary and upon coder review may  be revised to meet current compliance requirements. Dr. Evia Hof Selena Daily MD, MD 03/03/2024 10:48:28 AM This report has been signed electronically. Number of Addenda: 0 Note Initiated On: 03/03/2024 10:25 AM Estimated Blood Loss:  Estimated blood loss was minimal.      [facility]

## 2024-03-03 NOTE — H&P (Signed)
 Karma Oz, MD [address]  [address]  [place], [place] [address]  Main: [phone]  Fax: [phone] Pager: [phone]  Primary Care Physician:  [facility] Primary Gastroenterologist:  Dr. Karma Oz  Pre-Procedure History & Physical: HPI:  Casey Franco is a 63 y.o. male is here for an endoscopy.   Past Medical History:  Diagnosis Date   AKI (acute kidney injury) (HCC) 08/30/2018   Diabetes mellitus without complication (HCC)    no meds since weight loss   Dialysis patient (HCC)    Mon, Wed, Fri   Hyperkalemia 02/04/2021   Hypertension    Metabolic acidosis, increased anion gap 02/04/2021   Renal disorder    Sepsis without acute organ dysfunction (HCC) 11/30/2023    Past Surgical History:  Procedure Laterality Date   CATARACT EXTRACTION W/PHACO Right 10/08/2023   Procedure: CATARACT EXTRACTION PHACO AND INTRAOCULAR LENS PLACEMENT (IOC) RIGHT DIABETIC 12.19 01:08.8;  Surgeon: Trudi Fus, MD;  Location: [facility]ERY [facility];  Service: Ophthalmology;  Laterality: Right;   CATARACT EXTRACTION W/PHACO Left 10/22/2023   Procedure: CATARACT EXTRACTION PHACO AND INTRAOCULAR LENS PLACEMENT (IOC) LEFT DIABETIC MALYUGIN;  Surgeon: Trudi Fus, MD;  Location: [facility];  Service: Ophthalmology;  Laterality: Left;  7.66 0:54.2   COLONOSCOPY WITH PROPOFOL  N/A 11/21/2021   Procedure: COLONOSCOPY WITH PROPOFOL ;  Surgeon: Selena Daily, MD;  Location: [facility] ENDOSCOPY;  Service: Gastroenterology;  Laterality: N/A;   DIALYSIS/PERMA CATHETER REPAIR N/A 12/15/2023   Procedure: DIALYSIS/PERMA CATHETER REPAIR;  Surgeon: Jackquelyn Mass, MD;  Location: [facility] INVASIVE CV LAB;  Service: Cardiovascular;  Laterality: N/A;   ESOPHAGOGASTRODUODENOSCOPY (EGD) WITH PROPOFOL  N/A 11/21/2021   Procedure: ESOPHAGOGASTRODUODENOSCOPY (EGD) WITH PROPOFOL ;  Surgeon: Selena Daily, MD;  Location: [facility] ENDOSCOPY;  Service:  Gastroenterology;  Laterality: N/A;   INSERTION OF DIALYSIS CATHETER     LEG SURGERY  1987    Prior to Admission medications   Medication Sig Start Date End Date Taking? Authorizing Provider  calcium  acetate (PHOSLO ) 667 MG capsule Take 2 capsules (1,334 mg total) by mouth 3 (three) times a week. 12/23/23 03/22/24 Yes Garrison Kanner, MD  epoetin  alfa-epbx (RETACRIT ) 10000 UNIT/ML injection Inject 1 mL (10,000 Units total) into the vein every Monday, Wednesday, and Friday with hemodialysis. 12/25/23  Yes Garrison Kanner, MD  irbesartan  (AVAPRO ) 300 MG tablet Take 1 tablet (300 mg total) by mouth daily. 12/23/23 03/22/24 Yes Garrison Kanner, MD  Multiple Vitamin (DAILY-VITE MULTIVITAMIN) TABS Take 1 tablet by mouth at bedtime. 12/23/23 04/01/24 Yes Garrison Kanner, MD  tamsulosin  (FLOMAX ) 0.4 MG CAPS capsule Take 1 capsule (0.4 mg total) by mouth daily. 12/23/23 03/22/24 Yes Garrison Kanner, MD  VITAMIN E PO Take by mouth.   Yes [provider]  albumin  human 25 % bottle Inject 25 grams no matter what if more then 5L are removed then another 25 grams 12/31/23   Verginia Toohey, Elson Halon, MD  apixaban  (ELIQUIS ) 2.5 MG TABS tablet Take 1 tablet (2.5 mg total) by mouth 2 (two) times daily. 12/23/23 03/22/24  Garrison Kanner, MD  blood glucose meter kit and supplies KIT Dispense based on patient and insurance preference. Use up to four times daily as directed. (FOR ICD-9 250.00, 250.01). 09/01/18   Cher Cordial, MD  ciprofloxacin  (CIPRO ) 250 MG tablet Take 1 tablet (250 mg total) by mouth at bedtime. Patient not taking: Reported on 03/03/2024 12/23/23 03/22/24  Garrison Kanner, MD  ciprofloxacin  (CIPRO ) 500 MG tablet Take 1 tablet (500  mg total) by mouth daily. Patient not taking: Reported on 03/03/2024 01/18/24   Selena Daily, MD  feeding supplement (ENSURE ENLIVE / ENSURE PLUS) LIQD Take 237 mLs by mouth 3 (three) times daily between meals. 12/23/23   Garrison Kanner, MD  lactulose  (CHRONULAC ) 10 GM/15ML solution Take 30 mLs (20 g total) by mouth  daily. 12/23/23 03/22/24  Garrison Kanner, MD  melatonin 5 MG TABS Take 1 tablet (5 mg total) by mouth at bedtime as needed. 12/23/23   Garrison Kanner, MD  metoprolol  tartrate (LOPRESSOR ) 25 MG tablet Take 1 tablet (25 mg total) by mouth 2 (two) times daily. Patient not taking: Reported on 02/25/2024 12/23/23   Garrison Kanner, MD    Allergies as of 02/18/2024   (No Known Allergies)    Family History  Problem Relation Age of Onset   Diabetes Mellitus II Sister    Diabetes Mellitus II Paternal Grandmother     Social History   Socioeconomic History   Marital status: Single    Spouse name: Not on file   Number of children: Not on file   Years of education: Not on file   Highest education level: Not on file  Occupational History   Not on file  Tobacco Use   Smoking status: Former    Types: Cigarettes   Smokeless tobacco: Never  Vaping Use   Vaping status: Never Used  Substance and Sexual Activity   Alcohol use: Yes    Alcohol/week: 7.0 standard drinks of alcohol    Types: 7 Cans of beer per week    Comment: no drinking 10/2023   Drug use: Not Currently    Types: Cocaine , Marijuana    Comment: 09/24/23 - Marijuana 3x/week. No cocaine  since Friday 08/27/2018   Sexual activity: Not on file  Other Topics Concern   Not on file  Social History Narrative   Not on file   Social Drivers of Health   Financial Resource Strain: Low Risk  (05/15/2020)   Received from [facility]   Overall Financial Resource Strain (CARDIA)    Difficulty of Paying Living Expenses: Not very hard  Food Insecurity: Patient Declined (11/28/2023)   Hunger Vital Sign    Worried About Running Out of Food in the Last Year: Patient declined    Ran Out of Food in the Last Year: Patient declined  Transportation Needs: Patient Declined (11/28/2023)   PRAPARE - Administrator, Civil Service (Medical): Patient declined    Lack of Transportation (Non-Medical): Patient declined  Physical Activity: Not  on file  Stress: Not on file  Social Connections: Not on file  Intimate Partner Violence: Patient Unable To Answer (11/28/2023)   Humiliation, Afraid, Rape, and Kick questionnaire    Fear of Current or Ex-Partner: Patient unable to answer    Emotionally Abused: Patient unable to answer    Physically Abused: Patient unable to answer    Sexually Abused: Patient unable to answer    Review of Systems: See HPI, otherwise negative ROS  Physical Exam: BP (!) 130/103   Pulse (!) 117   Temp (!) 97.1 F (36.2 C) (Temporal)   Resp 20   Wt 59.4 kg   SpO2 100%   BMI 17.77 kg/m  General:   Alert,  pleasant and cooperative in NAD Head:  Normocephalic and atraumatic. Neck:  Supple; no masses or thyromegaly. Lungs:  Clear throughout to auscultation.    Heart:  Regular rate and rhythm. Abdomen:  Soft,  nontender and nondistended. Normal bowel sounds, without guarding, and without rebound.   Neurologic:  Alert and  oriented x4;  grossly normal neurologically.  Impression/Plan: Casey Franco is here for an endoscopy to be performed for variceal screening  Risks, benefits, limitations, and alternatives regarding  endoscopy have been reviewed with the patient.  Questions have been answered.  All parties agreeable.   Ellis Guys, MD  03/03/2024, 9:23 AM

## 2024-03-03 NOTE — Anesthesia Preprocedure Evaluation (Signed)
 Anesthesia Evaluation  Patient identified by MRN, date of birth, ID band Patient awake    Reviewed: Allergy & Precautions, H&P , NPO status , Patient's Chart, lab work & pertinent test results  Airway Mallampati: III  TM Distance: >3 FB Neck ROM: Full    Dental no notable dental hx. (+) Poor Dentition Very poor dentition, but patient states no loose teeth::   Pulmonary neg pulmonary ROS, Patient abstained from smoking., former smoker   Pulmonary exam normal breath sounds clear to auscultation       Cardiovascular hypertension, On Medications +CHF  Normal cardiovascular exam Rhythm:Regular Rate:Normal     Neuro/Psych negative neurological ROS  negative psych ROS   GI/Hepatic negative GI ROS, Neg liver ROS,,,(+) Hepatitis -  Endo/Other  negative endocrine ROSdiabetes    Renal/GU Dialysis and ESRFRenal disease  negative genitourinary   Musculoskeletal   Abdominal   Peds  Hematology negative hematology ROS (+) Blood dyscrasia, anemia   Anesthesia Other Findings Past Medical History: 08/30/2018: AKI (acute kidney injury) (HCC) No date: Diabetes mellitus without complication (HCC)     Comment:  no meds since weight loss No date: Dialysis patient (HCC)     Comment:  Mon, Wed, Fri 02/04/2021: Hyperkalemia No date: Hypertension 02/04/2021: Metabolic acidosis, increased anion gap No date: Renal disorder 11/30/2023: Sepsis without acute organ dysfunction [facility])  Past Surgical History: 10/08/2023: CATARACT EXTRACTION W/PHACO; Right     Comment:  Procedure: CATARACT EXTRACTION PHACO AND INTRAOCULAR               LENS PLACEMENT (IOC) RIGHT DIABETIC 12.19 01:08.8;                Surgeon: Trudi Fus, MD;  Location: [facility]               SURGERY CNTR;  Service: Ophthalmology;  Laterality:               Right; 10/22/2023: CATARACT EXTRACTION W/PHACO; Left     Comment:  Procedure: CATARACT EXTRACTION PHACO AND  INTRAOCULAR               LENS PLACEMENT (IOC) LEFT DIABETIC MALYUGIN;  Surgeon:               Trudi Fus, MD;  Location: [facility]               CNTR;  Service: Ophthalmology;  Laterality: Left;                7.66 0:54.2 11/21/2021: COLONOSCOPY WITH PROPOFOL ; N/A     Comment:  Procedure: COLONOSCOPY WITH PROPOFOL ;  Surgeon: Selena Daily, MD;  Location: [facility] ENDOSCOPY;  Service:               Gastroenterology;  Laterality: N/A; 12/15/2023: DIALYSIS/PERMA CATHETER REPAIR; N/A     Comment:  Procedure: DIALYSIS/PERMA CATHETER REPAIR;  Surgeon:               Jackquelyn Mass, MD;  Location: [facility] INVASIVE CV LAB;               Service: Cardiovascular;  Laterality: N/A; 11/21/2021: ESOPHAGOGASTRODUODENOSCOPY (EGD) WITH PROPOFOL ; N/A     Comment:  Procedure: ESOPHAGOGASTRODUODENOSCOPY (EGD) WITH               PROPOFOL ;  Surgeon: Selena Daily, MD;  Location:  [facility] ENDOSCOPY;  Service: Gastroenterology;  Laterality:               N/A; No date: INSERTION OF DIALYSIS CATHETER 1987: LEG SURGERY  BMI    Body Mass Index: 17.77 kg/m      Reproductive/Obstetrics negative OB ROS                             Anesthesia Physical Anesthesia Plan  ASA: 3  Anesthesia Plan: General   Post-op Pain Management: Minimal or no pain anticipated   Induction: Intravenous  PONV Risk Score and Plan: 3 and Propofol  infusion, TIVA and Ondansetron   Airway Management Planned: Nasal Cannula  Additional Equipment: None  Intra-op Plan:   Post-operative Plan:   Informed Consent: I have reviewed the patients History and Physical, chart, labs and discussed the procedure including the risks, benefits and alternatives for the proposed anesthesia with the patient or authorized representative who has indicated his/her understanding and acceptance.     Dental advisory given  Plan Discussed with: CRNA and Surgeon  Anesthesia Plan  Comments: (Discussed risks of anesthesia with patient, including possibility of difficulty with spontaneous ventilation under anesthesia necessitating airway intervention, PONV, and rare risks such as cardiac or respiratory or neurological events, and allergic reactions. Discussed the role of CRNA in patient's perioperative care. Patient understands.)       Anesthesia Quick Evaluation

## 2024-03-04 ENCOUNTER — Encounter: Payer: Self-pay | Admitting: Gastroenterology

## 2024-03-04 LAB — SURGICAL PATHOLOGY

## 2024-03-08 ENCOUNTER — Other Ambulatory Visit: Payer: Self-pay

## 2024-03-08 ENCOUNTER — Ambulatory Visit: Payer: Self-pay | Admitting: Gastroenterology

## 2024-03-08 MED ORDER — OMEPRAZOLE 40 MG PO CPDR
40.0000 mg | DELAYED_RELEASE_CAPSULE | Freq: Every day | ORAL | 0 refills | Status: DC
  Filled 2024-03-08: qty 30, 30d supply, fill #0

## 2024-03-08 MED ORDER — OMEPRAZOLE 20 MG PO CPDR
20.0000 mg | DELAYED_RELEASE_CAPSULE | Freq: Every day | ORAL | 5 refills | Status: DC
  Filled 2024-03-08: qty 30, 30d supply, fill #0

## 2024-03-09 ENCOUNTER — Other Ambulatory Visit: Payer: Self-pay

## 2024-03-09 MED ORDER — OMEPRAZOLE 40 MG PO CPDR: 40.0000 mg | DELAYED_RELEASE_CAPSULE | Freq: Every day | ORAL | 0 refills | Status: DC

## 2024-03-09 MED ORDER — OMEPRAZOLE 20 MG PO CPDR: 20.0000 mg | DELAYED_RELEASE_CAPSULE | Freq: Every day | ORAL | 5 refills | Status: DC

## 2024-03-09 NOTE — Addendum Note (Signed)
 Addended by: Lovie Rudder on: 03/09/2024 09:56 AM   Modules accepted: Orders

## 2024-04-11 ENCOUNTER — Other Ambulatory Visit: Payer: Self-pay | Admitting: Gastroenterology

## 2024-05-20 ENCOUNTER — Emergency Department: Admission: EM | Admit: 2024-05-20 | Discharge: 2024-05-20 | Disposition: A | Discharge status: Home / Self Care

## 2024-05-20 ENCOUNTER — Other Ambulatory Visit: Payer: Self-pay

## 2024-05-20 ENCOUNTER — Encounter: Admission: EM | Disposition: A | Discharge status: Home / Self Care | Payer: Self-pay | Source: Home / Self Care

## 2024-05-20 DIAGNOSIS — Y839 Surgical procedure, unspecified as the cause of abnormal reaction of the patient, or of later complication, without mention of misadventure at the time of the procedure: Secondary | ICD-10-CM | POA: Diagnosis not present

## 2024-05-20 DIAGNOSIS — Z87891 Personal history of nicotine dependence: Secondary | ICD-10-CM | POA: Insufficient documentation

## 2024-05-20 DIAGNOSIS — Z992 Dependence on renal dialysis: Secondary | ICD-10-CM | POA: Insufficient documentation

## 2024-05-20 DIAGNOSIS — I132 Hypertensive heart and chronic kidney disease with heart failure and with stage 5 chronic kidney disease, or end stage renal disease: Secondary | ICD-10-CM | POA: Insufficient documentation

## 2024-05-20 DIAGNOSIS — T829XXA Unspecified complication of cardiac and vascular prosthetic device, implant and graft, initial encounter: Secondary | ICD-10-CM

## 2024-05-20 DIAGNOSIS — N186 End stage renal disease: Secondary | ICD-10-CM

## 2024-05-20 DIAGNOSIS — E1122 Type 2 diabetes mellitus with diabetic chronic kidney disease: Secondary | ICD-10-CM | POA: Insufficient documentation

## 2024-05-20 DIAGNOSIS — T8249XA Other complication of vascular dialysis catheter, initial encounter: Secondary | ICD-10-CM

## 2024-05-20 DIAGNOSIS — I509 Heart failure, unspecified: Secondary | ICD-10-CM | POA: Diagnosis not present

## 2024-05-20 HISTORY — PX: DIALYSIS/PERMA CATHETER INSERTION: CATH118288

## 2024-05-20 LAB — CBC WITH DIFFERENTIAL/PLATELET
Abs Immature Granulocytes: 0.02 K/uL (ref 0.00–0.07)
Basophils Absolute: 0 K/uL (ref 0.0–0.1)
Basophils Relative: 1 %
Eosinophils Absolute: 0.1 K/uL (ref 0.0–0.5)
Eosinophils Relative: 3 %
HCT: 30.1 % — ABNORMAL LOW (ref 39.0–52.0)
Hemoglobin: 9.6 g/dL — ABNORMAL LOW (ref 13.0–17.0)
Immature Granulocytes: 0 %
Lymphocytes Relative: 14 %
Lymphs Abs: 0.6 K/uL — ABNORMAL LOW (ref 0.7–4.0)
MCH: 26.7 pg (ref 26.0–34.0)
MCHC: 31.9 g/dL (ref 30.0–36.0)
MCV: 83.8 fL (ref 80.0–100.0)
Monocytes Absolute: 0.6 K/uL (ref 0.1–1.0)
Monocytes Relative: 12 %
Neutro Abs: 3.3 K/uL (ref 1.7–7.7)
Neutrophils Relative %: 70 %
Platelets: 167 K/uL (ref 150–400)
RBC: 3.59 MIL/uL — ABNORMAL LOW (ref 4.22–5.81)
RDW: 16.9 % — ABNORMAL HIGH (ref 11.5–15.5)
WBC: 4.7 K/uL (ref 4.0–10.5)
nRBC: 0 % (ref 0.0–0.2)

## 2024-05-20 LAB — BASIC METABOLIC PANEL WITH GFR
Anion gap: 14 (ref 5–15)
BUN: 47 mg/dL — ABNORMAL HIGH (ref 8–23)
CO2: 22 mmol/L (ref 22–32)
Calcium: 8.9 mg/dL (ref 8.9–10.3)
Chloride: 100 mmol/L (ref 98–111)
Creatinine, Ser: 5.4 mg/dL — ABNORMAL HIGH (ref 0.61–1.24)
GFR, Estimated: 11 mL/min — ABNORMAL LOW (ref >60)
Glucose, Bld: 107 mg/dL — ABNORMAL HIGH (ref 70–99)
Potassium: 3.2 mmol/L — ABNORMAL LOW (ref 3.5–5.1)
Sodium: 136 mmol/L (ref 135–145)

## 2024-05-20 SURGERY — DIALYSIS/PERMA CATHETER REPAIR
Anesthesia: Moderate Sedation | Laterality: Right

## 2024-05-20 SURGERY — DIALYSIS/PERMA CATHETER INSERTION
Anesthesia: Moderate Sedation | Laterality: Right

## 2024-05-20 MED ORDER — LIDOCAINE-EPINEPHRINE (PF) 1 %-1:200000 IJ SOLN
INTRAMUSCULAR | Status: DC | PRN
  Administered 2024-05-20: 20 mL

## 2024-05-20 MED ORDER — CEFAZOLIN SODIUM-DEXTROSE 2-4 GM/100ML-% IV SOLN: 2.0000 g | INTRAVENOUS | Status: DC

## 2024-05-20 MED ORDER — CEFAZOLIN SODIUM-DEXTROSE 1-4 GM/50ML-% IV SOLN
1.0000 g | INTRAVENOUS | Status: AC
  Administered 2024-05-20: 1 g via INTRAVENOUS

## 2024-05-20 MED ORDER — DIPHENHYDRAMINE HCL 50 MG/ML IJ SOLN: 50.0000 mg | Freq: Once | INTRAMUSCULAR | Status: DC | PRN

## 2024-05-20 MED ORDER — HEPARIN (PORCINE) IN NACL 1000-0.9 UT/500ML-% IV SOLN
INTRAVENOUS | Status: DC | PRN
  Administered 2024-05-20: 500 mL

## 2024-05-20 MED ORDER — MIDAZOLAM HCL 2 MG/2ML IJ SOLN
INTRAMUSCULAR | Status: AC
  Filled 2024-05-20: qty 2

## 2024-05-20 MED ORDER — CEFAZOLIN SODIUM-DEXTROSE 1-4 GM/50ML-% IV SOLN
INTRAVENOUS | Status: AC
Start: 2024-05-20 — End: 2024-05-20
  Filled 2024-05-20: qty 50

## 2024-05-20 MED ORDER — FENTANYL CITRATE PF 50 MCG/ML IJ SOSY
PREFILLED_SYRINGE | INTRAMUSCULAR | Status: AC
  Filled 2024-05-20: qty 1

## 2024-05-20 MED ORDER — SODIUM CHLORIDE 0.9 % IV SOLN: INTRAVENOUS | Status: DC

## 2024-05-20 MED ORDER — METHYLPREDNISOLONE SODIUM SUCC 125 MG IJ SOLR: 125.0000 mg | Freq: Once | INTRAMUSCULAR | Status: DC | PRN

## 2024-05-20 MED ORDER — HEPARIN SODIUM (PORCINE) 10000 UNIT/ML IJ SOLN
INTRAMUSCULAR | Status: DC | PRN
  Administered 2024-05-20: 10000 [IU]

## 2024-05-20 MED ORDER — FAMOTIDINE 20 MG PO TABS: 40.0000 mg | ORAL_TABLET | Freq: Once | ORAL | Status: DC | PRN

## 2024-05-20 MED ORDER — CEFAZOLIN SODIUM-DEXTROSE 1-4 GM/50ML-% IV SOLN: 1.0000 g | INTRAVENOUS | Status: DC

## 2024-05-20 MED ORDER — MIDAZOLAM HCL 2 MG/ML PO SYRP: 8.0000 mg | ORAL_SOLUTION | Freq: Once | ORAL | Status: DC | PRN

## 2024-05-20 MED ORDER — FENTANYL CITRATE (PF) 100 MCG/2ML IJ SOLN
INTRAMUSCULAR | Status: DC | PRN
  Administered 2024-05-20: 50 ug via INTRAVENOUS

## 2024-05-20 MED ORDER — HEPARIN SODIUM (PORCINE) 10000 UNIT/ML IJ SOLN
INTRAMUSCULAR | Status: AC
  Filled 2024-05-20: qty 1

## 2024-05-20 MED ORDER — SODIUM CHLORIDE 0.9 % IV SOLN
INTRAVENOUS | Status: DC
  Administered 2024-05-20: 250 mL via INTRAVENOUS

## 2024-05-20 MED ORDER — MIDAZOLAM HCL 2 MG/2ML IJ SOLN
INTRAMUSCULAR | Status: DC | PRN
  Administered 2024-05-20: 2 mg via INTRAVENOUS

## 2024-05-20 SURGICAL SUPPLY — 6 items
BIOPATCH RED 1 DISK 7.0 (GAUZE/BANDAGES/DRESSINGS) IMPLANT
CATH PALINDROME-P 19CM W/VT (CATHETERS) IMPLANT
GUIDEWIRE SUPER STIFF .035X180 (WIRE) IMPLANT
PACK ANGIOGRAPHY (CUSTOM PROCEDURE TRAY) ×1 IMPLANT
SUT MNCRL AB 4-0 PS2 18 (SUTURE) IMPLANT
SUT PROLENE 0 CT 1 30 (SUTURE) IMPLANT

## 2024-05-20 NOTE — OR Nursing (Signed)
 At 1500 when getting pt up to dress noted tickle blood from under pressure dressing. Pressure held 15 minutes, hematoma expressed. Dr Marea notified via phone call. Pressure dressing reapplied per Dr Marea instruction. Instructed pt to reinforce dressing as needed at home. Sent home with extra box 4x4s and MIcrofoam tape. Pt encouraged to go to dialysis in am. He said he probably wont have a ride. Taxi called to take him home. Voucher given. Pt encouraged to stay upright as much as possible to help ooze stop.

## 2024-05-20 NOTE — H&P (View-Only) (Signed)
 Hospital Consult    Reason for Consult:  Perma Cath Exchange Requesting Physician:  Dr Dominique Klein MD  MRN #:  [id number]  History of Present Illness: This is a 63 y.o. male from [facility] dialysis clinic due to a Vacutainer being broken off into the access of the port during a routine visit. Pt states that a new worker at the facility applied to much pressure and it broke off into the catheter. They could not get it out. Pt has no other complaints. Vascular Surgery Consulted to exchange/repair catheter  Past Medical History:  Diagnosis Date   AKI (acute kidney injury) (HCC) 08/30/2018   Diabetes mellitus without complication (HCC)    no meds since weight loss   Dialysis patient (HCC)    Mon, Wed, Fri   Hyperkalemia 02/04/2021   Hypertension    Metabolic acidosis, increased anion gap 02/04/2021   Renal disorder    Sepsis without acute organ dysfunction (HCC) 11/30/2023    Past Surgical History:  Procedure Laterality Date   CATARACT EXTRACTION W/PHACO Right 10/08/2023   Procedure: CATARACT EXTRACTION PHACO AND INTRAOCULAR LENS PLACEMENT (IOC) RIGHT DIABETIC 12.19 01:08.8;  Surgeon: Enola Feliciano Hugger, MD;  Location: [facility];  Service: Ophthalmology;  Laterality: Right;   CATARACT EXTRACTION W/PHACO Left 10/22/2023   Procedure: CATARACT EXTRACTION PHACO AND INTRAOCULAR LENS PLACEMENT (IOC) LEFT DIABETIC MALYUGIN;  Surgeon: Enola Feliciano Hugger, MD;  Location: [facility] SURGERY CNTR;  Service: Ophthalmology;  Laterality: Left;  7.66 0:54.2   COLONOSCOPY WITH PROPOFOL  N/A 11/21/2021   Procedure: COLONOSCOPY WITH PROPOFOL ;  Surgeon: Unk Corinn Skiff, MD;  Location: [facility] ENDOSCOPY;  Service: Gastroenterology;  Laterality: N/A;   DIALYSIS/PERMA CATHETER REPAIR N/A 12/15/2023   Procedure: DIALYSIS/PERMA CATHETER REPAIR;  Surgeon: Jama Cordella MATSU, MD;  Location: [facility] INVASIVE CV LAB;  Service: Cardiovascular;  Laterality: N/A;   ESOPHAGOGASTRODUODENOSCOPY N/A 03/03/2024    Procedure: EGD (ESOPHAGOGASTRODUODENOSCOPY);  Surgeon: Unk Corinn Skiff, MD;  Location: [facility] ENDOSCOPY;  Service: Gastroenterology;  Laterality: N/A;   ESOPHAGOGASTRODUODENOSCOPY (EGD) WITH PROPOFOL  N/A 11/21/2021   Procedure: ESOPHAGOGASTRODUODENOSCOPY (EGD) WITH PROPOFOL ;  Surgeon: Unk Corinn Skiff, MD;  Location: [facility] ENDOSCOPY;  Service: Gastroenterology;  Laterality: N/A;   INSERTION OF DIALYSIS CATHETER     LEG SURGERY  1987    No Known Allergies  Prior to Admission medications   Medication Sig Start Date End Date Taking? Authorizing Provider  albumin  human 25 % bottle Inject 25 grams no matter what if more then 5L are removed then another 25 grams 12/31/23   Vanga, Corinn Skiff, MD  apixaban  (ELIQUIS ) 2.5 MG TABS tablet Take 1 tablet (2.5 mg total) by mouth 2 (two) times daily. 12/23/23 03/22/24  Awanda City, MD  blood glucose meter kit and supplies KIT Dispense based on patient and insurance preference. Use up to four times daily as directed. (FOR ICD-9 250.00, 250.01). 09/01/18   Tobie Press, MD  ciprofloxacin  (CIPRO ) 500 MG tablet Take 1 tablet (500 mg total) by mouth daily. Patient not taking: Reported on 03/03/2024 01/18/24   Unk Corinn Skiff, MD  epoetin  alfa-epbx (RETACRIT ) 10000 UNIT/ML injection Inject 1 mL (10,000 Units total) into the vein every Monday, Wednesday, and Friday with hemodialysis. 12/25/23   Awanda City, MD  feeding supplement (ENSURE ENLIVE / ENSURE PLUS) LIQD Take 237 mLs by mouth 3 (three) times daily between meals. 12/23/23   Awanda City, MD  irbesartan  (AVAPRO ) 300 MG tablet Take 1 tablet (300 mg total) by mouth daily. 12/23/23 03/22/24  Awanda City, MD  melatonin 5 MG TABS Take 1 tablet (5 mg total) by mouth at bedtime as needed. 12/23/23   Awanda City, MD  omeprazole  (PRILOSEC) 40 MG capsule TAKE 1 CAPSULE BY MOUTH ONCE DAILY. THEN DECREASE TO 20 MG DAILY 04/12/24   Vanga, Rohini Reddy, MD  VITAMIN E PO Take by mouth.    [provider]    Social History    Socioeconomic History   Marital status: Single    Spouse name: Not on file   Number of children: Not on file   Years of education: Not on file   Highest education level: Not on file  Occupational History   Not on file  Tobacco Use   Smoking status: Former    Types: Cigarettes   Smokeless tobacco: Never  Vaping Use   Vaping status: Never Used  Substance and Sexual Activity   Alcohol use: Yes    Alcohol/week: 7.0 standard drinks of alcohol    Types: 7 Cans of beer per week    Comment: no drinking 10/2023   Drug use: Not Currently    Types: Cocaine , Marijuana    Comment: 09/24/23 - Marijuana 3x/week. No cocaine  since Friday 08/27/2018   Sexual activity: Not on file  Other Topics Concern   Not on file  Social History Narrative   Not on file   Social Drivers of Health   Financial Resource Strain: Low Risk  (05/15/2020)   Received from [facility]   Overall Financial Resource Strain (CARDIA)    Difficulty of Paying Living Expenses: Not very hard  Food Insecurity: Patient Declined (11/28/2023)   Hunger Vital Sign    Worried About Running Out of Food in the Last Year: Patient declined    Ran Out of Food in the Last Year: Patient declined  Transportation Needs: Patient Declined (11/28/2023)   PRAPARE - Administrator, Civil Service (Medical): Patient declined    Lack of Transportation (Non-Medical): Patient declined  Physical Activity: Not on file  Stress: Not on file  Social Connections: Not on file  Intimate Partner Violence: Patient Unable To Answer (11/28/2023)   Humiliation, Afraid, Rape, and Kick questionnaire    Fear of Current or Ex-Partner: Patient unable to answer    Emotionally Abused: Patient unable to answer    Physically Abused: Patient unable to answer    Sexually Abused: Patient unable to answer     Family History  Problem Relation Age of Onset   Diabetes Mellitus II Sister    Diabetes Mellitus II Paternal Grandmother     ROS: Otherwise  negative unless mentioned in HPI  Physical Examination  Vitals:   05/20/24 0808  BP: 116/77  Pulse: 70  Resp: (!) 23  Temp: 97.9 F (36.6 C)  SpO2: 100%   Body mass index is 21.43 kg/m.  General:  WDWN in NAD Gait: Not observed HENT: WNL, normocephalic Pulmonary: normal non-labored breathing, without Rales, rhonchi,  wheezing Cardiac: Irregular hx atrial fibrillation, without  Murmurs, rubs or gallops; without carotid bruits Abdomen: Positive bowel sounds throughout, soft, NT/ND, no masses Skin: without rashes Vascular Exam/Pulses: Palpable Pulses throughout. All extremities are warm to the touch.  Extremities: without ischemic changes, without Gangrene , without cellulitis; without open wounds;  Musculoskeletal: no muscle wasting or atrophy  Neurologic: A&O X 3;  No focal weakness or paresthesias are detected; speech is fluent/normal Psychiatric:  The pt has Normal affect. Lymph:  Unremarkable  CBC    Component Value Date/Time   WBC  8.0 12/23/2023 0759   RBC 2.42 (L) 12/23/2023 0759   HGB 7.2 (L) 12/23/2023 0759   HGB 9.0 (L) 11/04/2021 1319   HCT 22.4 (L) 12/23/2023 0759   HCT 25.9 (L) 11/04/2021 1319   PLT 246 12/23/2023 0759   PLT 145 (L) 11/04/2021 1319   MCV 92.6 12/23/2023 0759   MCV 92 11/04/2021 1319   MCH 29.8 12/23/2023 0759   MCHC 32.1 12/23/2023 0759   RDW 17.2 (H) 12/23/2023 0759   RDW 14.2 11/04/2021 1319   LYMPHSABS 1.0 12/16/2023 0414   MONOABS 1.1 (H) 12/16/2023 0414   EOSABS 0.1 12/16/2023 0414   BASOSABS 0.1 12/16/2023 0414    BMET    Component Value Date/Time   NA 133 (L) 12/23/2023 0759   NA 142 11/04/2021 1319   K 3.8 12/23/2023 0759   CL 96 (L) 12/23/2023 0759   CO2 27 12/23/2023 0759   GLUCOSE 113 (H) 12/23/2023 0759   BUN 30 (H) 12/23/2023 0759   BUN 16 11/04/2021 1319   CREATININE 5.79 (H) 12/23/2023 0759   CALCIUM  8.2 (L) 12/23/2023 0759   GFRNONAA 10 (L) 12/23/2023 0759   GFRAA 15 (L) 09/01/2018 0412    COAGS: Lab  Results  Component Value Date   INR 1.2 12/31/2023   INR 1.9 (H) 11/26/2023     Non-Invasive Vascular Imaging:   None Ordered.  Statin:  No. Beta Blocker:  No. Aspirin:  No. ACEI:  No. ARB:  No. CCB use:  No Other antiplatelets/anticoagulants:  Yes.   Eliquis  2.5 mg BID   ASSESSMENT/PLAN: This is a 63 y.o. male who was sent to [facility] emergency department from [facility] dialysis due to a Vacutainer being broken off into the access of the port during a routine visit.  Vascular surgery consulted to exchange her repair patient's dialysis permacatheter.  Patient endorses the catheter he has now is a new catheter from about 3 weeks ago.  Vascular surgery plans on taking the patient to the vascular lab today on 05/20/2024 for dialysis permacatheter exchange/repair.  I discussed in detail with the patient the procedure, benefits, risk, complications.  Patient endorses he has had multiple prior permacath's.  I answered all his questions today.  Patient has been n.p.o. since midnight.  Patient does take Eliquis  2.5 mg which she took earlier this morning.   -I discussed the case in detail with Dr. Selinda Gu MD and he agrees with plan.   Gwendlyn JONELLE Shank [facility] 05/20/2024 8:33 AM

## 2024-05-20 NOTE — Consult Note (Signed)
 Hospital Consult    Reason for Consult:  Perma Cath Exchange Requesting Physician:  Dr Dominique Klein MD  MRN #:  [id number]  History of Present Illness: This is a 63 y.o. male from [facility] dialysis clinic due to a Vacutainer being broken off into the access of the port during a routine visit. Pt states that a new worker at the facility applied to much pressure and it broke off into the catheter. They could not get it out. Pt has no other complaints. Vascular Surgery Consulted to exchange/repair catheter  Past Medical History:  Diagnosis Date   AKI (acute kidney injury) (HCC) 08/30/2018   Diabetes mellitus without complication (HCC)    no meds since weight loss   Dialysis patient (HCC)    Mon, Wed, Fri   Hyperkalemia 02/04/2021   Hypertension    Metabolic acidosis, increased anion gap 02/04/2021   Renal disorder    Sepsis without acute organ dysfunction (HCC) 11/30/2023    Past Surgical History:  Procedure Laterality Date   CATARACT EXTRACTION W/PHACO Right 10/08/2023   Procedure: CATARACT EXTRACTION PHACO AND INTRAOCULAR LENS PLACEMENT (IOC) RIGHT DIABETIC 12.19 01:08.8;  Surgeon: Enola Feliciano Hugger, MD;  Location: [facility];  Service: Ophthalmology;  Laterality: Right;   CATARACT EXTRACTION W/PHACO Left 10/22/2023   Procedure: CATARACT EXTRACTION PHACO AND INTRAOCULAR LENS PLACEMENT (IOC) LEFT DIABETIC MALYUGIN;  Surgeon: Enola Feliciano Hugger, MD;  Location: [facility]ERY CNTR;  Service: Ophthalmology;  Laterality: Left;  7.66 0:54.2   COLONOSCOPY WITH PROPOFOL  N/A 11/21/2021   Procedure: COLONOSCOPY WITH PROPOFOL ;  Surgeon: Unk Corinn Skiff, MD;  Location: [facility] ENDOSCOPY;  Service: Gastroenterology;  Laterality: N/A;   DIALYSIS/PERMA CATHETER REPAIR N/A 12/15/2023   Procedure: DIALYSIS/PERMA CATHETER REPAIR;  Surgeon: Jama Cordella MATSU, MD;  Location: [facility] INVASIVE CV LAB;  Service: Cardiovascular;  Laterality: N/A;   ESOPHAGOGASTRODUODENOSCOPY N/A 03/03/2024    Procedure: EGD (ESOPHAGOGASTRODUODENOSCOPY);  Surgeon: Unk Corinn Skiff, MD;  Location: [facility] ENDOSCOPY;  Service: Gastroenterology;  Laterality: N/A;   ESOPHAGOGASTRODUODENOSCOPY (EGD) WITH PROPOFOL  N/A 11/21/2021   Procedure: ESOPHAGOGASTRODUODENOSCOPY (EGD) WITH PROPOFOL ;  Surgeon: Unk Corinn Skiff, MD;  Location: [facility] ENDOSCOPY;  Service: Gastroenterology;  Laterality: N/A;   INSERTION OF DIALYSIS CATHETER     LEG SURGERY  1987    No Known Allergies  Prior to Admission medications   Medication Sig Start Date End Date Taking? Authorizing Provider  albumin  human 25 % bottle Inject 25 grams no matter what if more then 5L are removed then another 25 grams 12/31/23   Vanga, Corinn Skiff, MD  apixaban  (ELIQUIS ) 2.5 MG TABS tablet Take 1 tablet (2.5 mg total) by mouth 2 (two) times daily. 12/23/23 03/22/24  Awanda City, MD  blood glucose meter kit and supplies KIT Dispense based on patient and insurance preference. Use up to four times daily as directed. (FOR ICD-9 250.00, 250.01). 09/01/18   Tobie Press, MD  ciprofloxacin  (CIPRO ) 500 MG tablet Take 1 tablet (500 mg total) by mouth daily. Patient not taking: Reported on 03/03/2024 01/18/24   Unk Corinn Skiff, MD  epoetin  alfa-epbx (RETACRIT ) 10000 UNIT/ML injection Inject 1 mL (10,000 Units total) into the vein every Monday, Wednesday, and Friday with hemodialysis. 12/25/23   Awanda City, MD  feeding supplement (ENSURE ENLIVE / ENSURE PLUS) LIQD Take 237 mLs by mouth 3 (three) times daily between meals. 12/23/23   Awanda City, MD  irbesartan  (AVAPRO ) 300 MG tablet Take 1 tablet (300 mg total) by mouth daily. 12/23/23 03/22/24  Awanda City, MD  melatonin 5 MG TABS Take 1 tablet (5 mg total) by mouth at bedtime as needed. 12/23/23   Awanda City, MD  omeprazole  (PRILOSEC) 40 MG capsule TAKE 1 CAPSULE BY MOUTH ONCE DAILY. THEN DECREASE TO 20 MG DAILY 04/12/24   Vanga, Rohini Reddy, MD  VITAMIN E PO Take by mouth.    [provider]    Social History    Socioeconomic History   Marital status: Single    Spouse name: Not on file   Number of children: Not on file   Years of education: Not on file   Highest education level: Not on file  Occupational History   Not on file  Tobacco Use   Smoking status: Former    Types: Cigarettes   Smokeless tobacco: Never  Vaping Use   Vaping status: Never Used  Substance and Sexual Activity   Alcohol use: Yes    Alcohol/week: 7.0 standard drinks of alcohol    Types: 7 Cans of beer per week    Comment: no drinking 10/2023   Drug use: Not Currently    Types: Cocaine , Marijuana    Comment: 09/24/23 - Marijuana 3x/week. No cocaine  since Friday 08/27/2018   Sexual activity: Not on file  Other Topics Concern   Not on file  Social History Narrative   Not on file   Social Drivers of Health   Financial Resource Strain: Low Risk  (05/15/2020)   Received from [facility]   Overall Financial Resource Strain (CARDIA)    Difficulty of Paying Living Expenses: Not very hard  Food Insecurity: Patient Declined (11/28/2023)   Hunger Vital Sign    Worried About Running Out of Food in the Last Year: Patient declined    Ran Out of Food in the Last Year: Patient declined  Transportation Needs: Patient Declined (11/28/2023)   PRAPARE - Administrator, Civil Service (Medical): Patient declined    Lack of Transportation (Non-Medical): Patient declined  Physical Activity: Not on file  Stress: Not on file  Social Connections: Not on file  Intimate Partner Violence: Patient Unable To Answer (11/28/2023)   Humiliation, Afraid, Rape, and Kick questionnaire    Fear of Current or Ex-Partner: Patient unable to answer    Emotionally Abused: Patient unable to answer    Physically Abused: Patient unable to answer    Sexually Abused: Patient unable to answer     Family History  Problem Relation Age of Onset   Diabetes Mellitus II Sister    Diabetes Mellitus II Paternal Grandmother     ROS: Otherwise  negative unless mentioned in HPI  Physical Examination  Vitals:   05/20/24 0808  BP: 116/77  Pulse: 70  Resp: (!) 23  Temp: 97.9 F (36.6 C)  SpO2: 100%   Body mass index is 21.43 kg/m.  General:  WDWN in NAD Gait: Not observed HENT: WNL, normocephalic Pulmonary: normal non-labored breathing, without Rales, rhonchi,  wheezing Cardiac: Irregular hx atrial fibrillation, without  Murmurs, rubs or gallops; without carotid bruits Abdomen: Positive bowel sounds throughout, soft, NT/ND, no masses Skin: without rashes Vascular Exam/Pulses: Palpable Pulses throughout. All extremities are warm to the touch.  Extremities: without ischemic changes, without Gangrene , without cellulitis; without open wounds;  Musculoskeletal: no muscle wasting or atrophy  Neurologic: A&O X 3;  No focal weakness or paresthesias are detected; speech is fluent/normal Psychiatric:  The pt has Normal affect. Lymph:  Unremarkable  CBC    Component Value Date/Time   WBC  8.0 12/23/2023 0759   RBC 2.42 (L) 12/23/2023 0759   HGB 7.2 (L) 12/23/2023 0759   HGB 9.0 (L) 11/04/2021 1319   HCT 22.4 (L) 12/23/2023 0759   HCT 25.9 (L) 11/04/2021 1319   PLT 246 12/23/2023 0759   PLT 145 (L) 11/04/2021 1319   MCV 92.6 12/23/2023 0759   MCV 92 11/04/2021 1319   MCH 29.8 12/23/2023 0759   MCHC 32.1 12/23/2023 0759   RDW 17.2 (H) 12/23/2023 0759   RDW 14.2 11/04/2021 1319   LYMPHSABS 1.0 12/16/2023 0414   MONOABS 1.1 (H) 12/16/2023 0414   EOSABS 0.1 12/16/2023 0414   BASOSABS 0.1 12/16/2023 0414    BMET    Component Value Date/Time   NA 133 (L) 12/23/2023 0759   NA 142 11/04/2021 1319   K 3.8 12/23/2023 0759   CL 96 (L) 12/23/2023 0759   CO2 27 12/23/2023 0759   GLUCOSE 113 (H) 12/23/2023 0759   BUN 30 (H) 12/23/2023 0759   BUN 16 11/04/2021 1319   CREATININE 5.79 (H) 12/23/2023 0759   CALCIUM  8.2 (L) 12/23/2023 0759   GFRNONAA 10 (L) 12/23/2023 0759   GFRAA 15 (L) 09/01/2018 0412    COAGS: Lab  Results  Component Value Date   INR 1.2 12/31/2023   INR 1.9 (H) 11/26/2023     Non-Invasive Vascular Imaging:   None Ordered.  Statin:  No. Beta Blocker:  No. Aspirin:  No. ACEI:  No. ARB:  No. CCB use:  No Other antiplatelets/anticoagulants:  Yes.   Eliquis  2.5 mg BID   ASSESSMENT/PLAN: This is a 63 y.o. male who was sent to [facility] emergency department from [facility] dialysis due to a Vacutainer being broken off into the access of the port during a routine visit.  Vascular surgery consulted to exchange her repair patient's dialysis permacatheter.  Patient endorses the catheter he has now is a new catheter from about 3 weeks ago.  Vascular surgery plans on taking the patient to the vascular lab today on 05/20/2024 for dialysis permacatheter exchange/repair.  I discussed in detail with the patient the procedure, benefits, risk, complications.  Patient endorses he has had multiple prior permacath's.  I answered all his questions today.  Patient has been n.p.o. since midnight.  Patient does take Eliquis  2.5 mg which she took earlier this morning.   -I discussed the case in detail with Dr. Selinda Gu MD and he agrees with plan.   Gwendlyn JONELLE Shank [facility] 05/20/2024 8:33 AM

## 2024-05-20 NOTE — ED Triage Notes (Signed)
 Pt BIB [facility]  from [facility] dialysis clinic due to a Vacutainer being broken off into the access of the port during a routine visit. Pt states that a new worker at the facility applied to much pressure and it broke off into the catheter. They could not get it out. Pt has no other complaints. 112/75 82 HR 98 RA

## 2024-05-20 NOTE — ED Provider Notes (Signed)
 [facility] Provider Note    Event Date/Time   First MD Initiated Contact with Patient 05/20/24 [phone]     (approximate)   History   Vascular Access Problem  Pt BIB [facility]  from [facility] due to a Vacutainer being broken off into the access of the port during a routine visit. Pt states that a new worker at the facility applied to much pressure and it broke off into the catheter. They could not get it out. Pt has no other complaints. 112/75 82 HR 98 RA   HPI Casey Franco is a 63 y.o. male PMH ESRD on dialysis, hypertension, anemia of chronic disease, chronic hepatitis C, DM 2, cirrhosis complicated by esophageal varices, paroxysmal A-fib on Eliquis , CHF presents for dialysis access problem - Patient was at his usual dialysis session this morning, notes that vacuum tube was inappropriately connected and that he believes there is a piece of material stuck in his catheter.  Dialysis was not performed. -No other complaints       Physical Exam   Triage Vital Signs: ED Triage Vitals [05/20/24 0808]  Encounter Vitals Group     BP 116/77     Girls Systolic BP Percentile      Girls Diastolic BP Percentile      Boys Systolic BP Percentile      Boys Diastolic BP Percentile      Pulse Rate 70     Resp (!) 23     Temp 97.9 F (36.6 C)     Temp Source Oral     SpO2 100 %     Weight 158 lb (71.7 kg)     Height 6' (1.829 m)     Head Circumference      Peak Flow      Pain Score 0     Pain Loc      Pain Education      Exclude from Growth Chart     Most recent vital signs: Vitals:   05/20/24 1330 05/20/24 1345  BP: (!) 124/97 (!) 113/90  Pulse: 70   Resp: 20 16  Temp:    SpO2: 97% 97%     General: Awake, no distress.  CV:  Good peripheral perfusion.  Dialysis catheter to right chest with missing cap and apparent blockage to tube with brown tip. Resp:  Normal effort.    ED Results / Procedures / Treatments   Labs (all labs  ordered are listed, but only abnormal results are displayed) Labs Reviewed  BASIC METABOLIC PANEL WITH GFR - Abnormal; Notable for the following components:      Result Value   Potassium 3.2 (*)    Glucose, Bld 107 (*)    BUN 47 (*)    Creatinine, Ser 5.40 (*)    GFR, Estimated 11 (*)    All other components within normal limits  CBC WITH DIFFERENTIAL/PLATELET - Abnormal; Notable for the following components:   RBC 3.59 (*)    Hemoglobin 9.6 (*)    HCT 30.1 (*)    RDW 16.9 (*)    Lymphs Abs 0.6 (*)    All other components within normal limits     EKG  N/a   RADIOLOGY N/a    PROCEDURES:  Critical Care performed: No  Procedures   MEDICATIONS ORDERED IN ED: Medications  0.9 %  sodium chloride  infusion ( Intravenous New Bag/Given 05/20/24 1104)  0.9 %  sodium chloride  infusion (250 mLs Intravenous New Bag/Given  05/20/24 1027)  midazolam  (VERSED ) 2 MG/ML syrup 8 mg (has no administration in time range)  methylPREDNISolone  sodium succinate (SOLU-MEDROL ) 125 mg/2 mL injection 125 mg (has no administration in time range)  famotidine  (PEPCID ) tablet 40 mg (has no administration in time range)  diphenhydrAMINE  (BENADRYL ) injection 50 mg (has no administration in time range)  Heparin  (Porcine) in NaCl 1000-0.9 UT/500ML-% SOLN (500 mLs  Given 05/20/24 1104)  lidocaine -EPINEPHrine  (PF) (XYLOCAINE -EPINEPHrine ) 1 %-1:200000 (PF) injection (20 mLs  Given 05/20/24 1104)  heparin  injection (10,000 Units Intracatheter Given 05/20/24 1104)  fentaNYL  (SUBLIMAZE ) injection (50 mcg Intravenous Given 05/20/24 1117)  midazolam  (VERSED ) injection (2 mg Intravenous Given 05/20/24 1117)  ceFAZolin  (ANCEF ) IVPB 1 g/50 mL premix (1 g Intravenous New Bag/Given 05/20/24 1105)     IMPRESSION / MDM / ASSESSMENT AND PLAN / ED COURSE  I reviewed the triage vital signs and the nursing notes.                              DDX/MDM/AP: Differential diagnosis includes, but is not limited to, dialysis catheter  malfunction, was scheduled for routine dialysis today, consider possibility of underlying hyperkalemia or other significant electrolyte abnormality.  Plan: - Will discuss with vascular surgery regarding appropriate management of dialysis catheter  Patient's presentation is most consistent with acute, uncomplicated illness.  The patient is on the cardiac monitor to evaluate for evidence of arrhythmia and/or significant heart rate changes.  ED course below.  Discussed with vascular surgery, recommend catheter exchange today, will perform this afternoon.  Basic screening labs unremarkable.  Taken to the OR by vascular for catheter exchange.  Clinical Course as of 05/20/24 1637  Fri May 20, 2024  0825 Vascular paged to discuss [MM]  0905 BMP with mild hypokalemia, will defer correction in the setting of ESRD on dialysis [MM]  0905 CBC with improved anemia [MM]    Clinical Course User Index [MM] Clarine Ozell LABOR, MD     FINAL CLINICAL IMPRESSION(S) / ED DIAGNOSES   Final diagnoses:  Complication associated with dialysis catheter     Rx / DC Orders   ED Discharge Orders          Ordered    Resume previous diet        05/20/24 1138    Driving Restrictions       Comments: No driving for 24 hours   91/91/74 1138    Lifting restrictions       Comments: No lifting for 24 hours   05/20/24 1138    No dressing needed       Comments: Replace only if drainage present   05/20/24 1138    Call MD for:  temperature >100.5        05/20/24 1138    Call MD for:  redness, tenderness, or signs of infection (pain, swelling, bleeding, redness, odor or green/yellow discharge around incision site)        05/20/24 1138    Call MD for:  severe or increased pain, loss or decreased feeling  in affected limb(s)        05/20/24 1138             Note:  This document was prepared using Dragon voice recognition software and may include unintentional dictation errors.   Clarine Ozell LABOR,  MD 05/20/24 [phone]

## 2024-05-20 NOTE — Progress Notes (Signed)
 Patient with hematoma to permcath site. Vascular staff came and assessed and redressed area with pressure dressing. Dr. Marea made aware. Stated patient can go home but instruct to sit up as long as possible to avoid new hematoma to site. I instructed patient to leave dressing in place until he goes to dialysis and to go to ER for any complications. Patient verbalized his understanding.

## 2024-05-20 NOTE — Interval H&P Note (Signed)
 History and Physical Interval Note:  05/20/2024 11:38 AM  Casey Franco  has presented today for surgery, with the diagnosis of esrd, poorly functioning catheter.  The various methods of treatment have been discussed with the patient and family. After consideration of risks, benefits and other options for treatment, the patient has consented to  Procedure(s): DIALYSIS/PERMA CATHETER INSERTION (Right) as a surgical intervention.  The patient's history has been reviewed, patient examined, no change in status, stable for surgery.  I have reviewed the patient's chart and labs.  Questions were answered to the patient's satisfaction.     Keats Kingry

## 2024-05-20 NOTE — Op Note (Signed)
 OPERATIVE NOTE    PRE-OPERATIVE DIAGNOSIS: 1. ESRD 2. Non-functional permcath  POST-OPERATIVE DIAGNOSIS: same as above  PROCEDURE: Fluoroscopic guidance for placement of catheter Placement of a 19 cm tip to cuff tunneled hemodialysis catheter via the right internal jugular vein and removal of previous catheter  SURGEON: Selinda Gu, MD  ANESTHESIA:  Local with moderate conscious sedation for 13 minutes using 2 mg of Versed  and 50 mcg of Fentanyl   ESTIMATED BLOOD LOSS: 5 cc  FINDING(S): none  SPECIMEN(S):  None  INDICATIONS:   Patient is a 63 y.o.male who presents with non-functional dialysis catheter and ESRD.  The patient needs long term dialysis access for their ESRD, and a Permcath is necessary.  Risks and benefits are discussed and informed consent is obtained.    DESCRIPTION: After obtaining full informed written consent, the patient was brought back to the vascular suite. The patient received moderate conscious sedation during a face-to-face encounter with me present throughout the entire procedure and supervising the RN monitoring the vital signs, pulse oximetry, telemetry, and mental status throughout the entire procedure. The patient's existing catheter, right neck and chest were sterilely prepped and draped in a sterile surgical field was created.  The existing catheter was dissected free from the fibrous sheath securing the cuff with hemostats and blunt dissection.  A wire was placed. The existing catheter was then removed and the wire used to keep venous access. I selected a 19 cm tip to cuff tunneled dialysis catheter.  Using fluoroscopic guidance the catheter tips were parked in the right atrium. The appropriate distal connectors were placed. It withdrew blood well and flushed easily with heparinized saline and a concentrated heparin  solution was then placed. It was secured to the chest wall with 2 Prolene sutures. A 4-0 Monocryl pursestring suture was placed around the exit  site. Sterile dressings were placed. The patient tolerated the procedure well and was taken to the recovery room in stable condition.  COMPLICATIONS: None  CONDITION: Stable  Selinda Gu 05/20/2024 11:38 AM   This note was created with Dragon Medical transcription system. Any errors in dictation are purely unintentional.

## 2024-05-23 ENCOUNTER — Other Ambulatory Visit: Payer: Self-pay

## 2024-05-23 ENCOUNTER — Emergency Department
Admission: EM | Admit: 2024-05-23 | Discharge: 2024-05-23 | Disposition: A | Discharge status: Home / Self Care | Attending: Emergency Medicine | Admitting: Emergency Medicine

## 2024-05-23 DIAGNOSIS — Z7901 Long term (current) use of anticoagulants: Secondary | ICD-10-CM

## 2024-05-23 DIAGNOSIS — Z79899 Other long term (current) drug therapy: Secondary | ICD-10-CM | POA: Diagnosis not present

## 2024-05-23 DIAGNOSIS — T8249XA Other complication of vascular dialysis catheter, initial encounter: Secondary | ICD-10-CM | POA: Diagnosis not present

## 2024-05-23 DIAGNOSIS — T829XXA Unspecified complication of cardiac and vascular prosthetic device, implant and graft, initial encounter: Secondary | ICD-10-CM

## 2024-05-23 DIAGNOSIS — Z992 Dependence on renal dialysis: Secondary | ICD-10-CM | POA: Diagnosis not present

## 2024-05-23 DIAGNOSIS — Z9889 Other specified postprocedural states: Secondary | ICD-10-CM | POA: Diagnosis not present

## 2024-05-23 DIAGNOSIS — N186 End stage renal disease: Secondary | ICD-10-CM | POA: Insufficient documentation

## 2024-05-23 LAB — BASIC METABOLIC PANEL WITH GFR
Anion gap: 12 (ref 5–15)
BUN: 66 mg/dL — ABNORMAL HIGH (ref 8–23)
CO2: 20 mmol/L — ABNORMAL LOW (ref 22–32)
Calcium: 8.8 mg/dL — ABNORMAL LOW (ref 8.9–10.3)
Chloride: 102 mmol/L (ref 98–111)
Creatinine, Ser: 7.91 mg/dL — ABNORMAL HIGH (ref 0.61–1.24)
GFR, Estimated: 7 mL/min — ABNORMAL LOW (ref >60)
Glucose, Bld: 88 mg/dL (ref 70–99)
Potassium: 3 mmol/L — ABNORMAL LOW (ref 3.5–5.1)
Sodium: 134 mmol/L — ABNORMAL LOW (ref 135–145)

## 2024-05-23 LAB — CBC
HCT: 26.9 % — ABNORMAL LOW (ref 39.0–52.0)
Hemoglobin: 8.7 g/dL — ABNORMAL LOW (ref 13.0–17.0)
MCH: 26.8 pg (ref 26.0–34.0)
MCHC: 32.3 g/dL (ref 30.0–36.0)
MCV: 82.8 fL (ref 80.0–100.0)
Platelets: 184 K/uL (ref 150–400)
RBC: 3.25 MIL/uL — ABNORMAL LOW (ref 4.22–5.81)
RDW: 16.7 % — ABNORMAL HIGH (ref 11.5–15.5)
WBC: 6.3 K/uL (ref 4.0–10.5)
nRBC: 0 % (ref 0.0–0.2)

## 2024-05-23 LAB — HEPATITIS B SURFACE ANTIGEN: Hepatitis B Surface Ag: NONREACTIVE

## 2024-05-23 LAB — PROTIME-INR
INR: 1.3 — ABNORMAL HIGH (ref 0.8–1.2)
Prothrombin Time: 17 s — ABNORMAL HIGH (ref 11.4–15.2)

## 2024-05-23 MED ORDER — CHLORHEXIDINE GLUCONATE CLOTH 2 % EX PADS
6.0000 | MEDICATED_PAD | Freq: Every day | CUTANEOUS | Status: DC
  Filled 2024-05-23: qty 6

## 2024-05-23 MED ORDER — HEPARIN SODIUM (PORCINE) 1000 UNIT/ML DIALYSIS: 1000.0000 [IU] | INTRAMUSCULAR | Status: DC | PRN

## 2024-05-23 MED ORDER — ALTEPLASE 2 MG IJ SOLR: 2.0000 mg | Freq: Once | INTRAMUSCULAR | Status: DC | PRN

## 2024-05-23 MED ORDER — HEPARIN SODIUM (PORCINE) 1000 UNIT/ML IJ SOLN
INTRAMUSCULAR | Status: AC
  Filled 2024-05-23: qty 4

## 2024-05-23 NOTE — ED Triage Notes (Signed)
 Pt arrived via EMS from dialysis due to his new subclavian port that is bleeding. Per pt he had to have a new port placed on 08/08 and since then it has been bleeding since then. Bleeding is controlled at this time.

## 2024-05-23 NOTE — Progress Notes (Signed)
 [facility]  ROUNDING NOTE   Subjective:   Casey Franco l is a 63 y.o. male with past medical history of HTN, ESRD, DM-2, and Hep C. Patient presents to emergency department with bleeding HD permcath. Appears patient was seen on Friday and required a permcath exchange due to broken piece.   Patient is known to our practice and receives outpatient dialysis at [facility]  on a MWF schedule. Last treatment received on Wednesday due to permcath malfunctions. States his access has been oozing all weekend.   Labs acceptable when you consider his last dialysis treatment was on Wednesday.  Sodium 134, potassium 6.0, BUN 66, hemoglobin 8.7. Vascular surgery consulted to evaluate access.   We have been consulted to manage dialysis needs.    Objective:  Vital signs in last 24 hours:  Temp:  [97.9 F (36.6 C)-98 F (36.7 C)] 97.9 F (36.6 C) (08/11 1230) Pulse Rate:  [68-71] 68 (08/11 1230) Resp:  [17-18] 17 (08/11 1230) BP: (118-121)/(74-86) 118/74 (08/11 1230) SpO2:  [100 %] 100 % (08/11 1230) Weight:  [71.7 kg] 71.7 kg (08/11 0721)  Weight change:  Filed Weights   05/23/24 0721  Weight: 71.7 kg    Intake/Output: No intake/output data recorded.   Intake/Output this shift:  No intake/output data recorded.  Physical Exam: General: NAD  Head: Normocephalic, atraumatic. Moist oral mucosal membranes  Eyes: Anicteric  Neck: Supple  Lungs:  Clear to auscultation, room air  Heart: Regular rate and rhythm  Abdomen:  Soft, nontender  Extremities:  No peripheral edema.  Neurologic: Awake, alert, conversant  Skin: Warm,dry, no rash  Access: Rt permcath     Basic Metabolic Panel: Recent Labs  Lab 05/20/24 0830 05/23/24 0731  NA 136 134*  K 3.2* 3.0*  CL 100 102  CO2 22 20*  GLUCOSE 107* 88  BUN 47* 66*  CREATININE 5.40* 7.91*  CALCIUM  8.9 8.8*    Liver Function Tests: No results for input(s): AST, ALT, ALKPHOS, BILITOT, PROT, ALBUMIN   in the last 168 hours. No results for input(s): LIPASE, AMYLASE in the last 168 hours. No results for input(s): AMMONIA in the last 168 hours.  CBC: Recent Labs  Lab 05/20/24 0830 05/23/24 0731  WBC 4.7 6.3  NEUTROABS 3.3  --   HGB 9.6* 8.7*  HCT 30.1* 26.9*  MCV 83.8 82.8  PLT 167 184    Cardiac Enzymes: No results for input(s): CKTOTAL, CKMB, CKMBINDEX, TROPONINI in the last 168 hours.  BNP: Invalid input(s): POCBNP  CBG: No results for input(s): GLUCAP in the last 168 hours.  Microbiology: Results for orders placed or performed during the hospital encounter of 01/05/24  Acid Fast Culture with reflexed sensitivities     Status: None   Collection Time: 01/05/24 12:05 PM   Specimen: PATH Cytology Peritoneal fluid; Body Fluid  Result Value Ref Range Status   Acid Fast Culture Negative  Final    Comment: (NOTE) No acid fast bacilli isolated after 6 weeks. Performed At: [facility] [address] [place], [place] [id number] Jennette Shorter MD Ey:[id number]    Source of Sample PERITONEAL  Final    Comment: Performed at [facility], [address] [place], [place] [address]  Acid Fast Smear (AFB)     Status: None   Collection Time: 01/05/24 12:05 PM   Specimen: PATH Cytology Peritoneal fluid; Body Fluid  Result Value Ref Range Status   AFB Specimen Processing Concentration  Final   Acid Fast Smear Negative  Final    Comment: (NOTE) Performed At: [facility] [address] [place], [place] [id number] Jennette Shorter MD Ey:[id number]    Source (AFB) PERITONEAL  Final    Comment: Performed at [facility], [address] [place], [place] [address]  Body fluid culture w Gram Stain     Status: None   Collection Time: 01/05/24 12:05 PM   Specimen: PATH Cytology Peritoneal fluid; Body Fluid  Result Value Ref Range Status   Specimen Description   Final    PERITONEAL Performed at [facility], [address], [place], [place] [address]    Special Requests   Final    NONE Performed at [facility], [address] [place], [place] [address]    Gram Stain   Final    NO WBC SEEN MODERATE GRAM POSITIVE COCCI IN PAIRS Performed at [facility], [address] [place], [place] [address]    Culture   Final    MIXED ANAEROBIC FLORA PRESENT.  CALL LAB IF FURTHER IID REQUIRED.   Report Status 01/07/2024 FINAL  Final    Coagulation Studies: Recent Labs    05/23/24 0731  LABPROT 17.0*  INR 1.3*    Urinalysis: No results for input(s): COLORURINE, LABSPEC, PHURINE, GLUCOSEU, HGBUR, BILIRUBINUR, KETONESUR, PROTEINUR, UROBILINOGEN, NITRITE, LEUKOCYTESUR in the last 72 hours.  Invalid input(s): APPERANCEUR    Imaging: No results found.   Medications:     Chlorhexidine  Gluconate Cloth  6 each Topical Q0600     Assessment/ Plan:  Mr. ANGAS Franco is a 63 y.o.  male with past medical history of HTN, ESRD, DM-2, and Hep C. Patient presents to emergency department with bleeding HD permcath. Appears patient was seen on Friday and required a permcath exchange due to broken piece.   [facility]/MWF/right chest PermCath   Malfunctioning hemodialysis access, reports bleeding since Friday. Permcath exchange on 8/8. Vascular surgery consulted to evaluated access. No need for exchange at this time. Request hemodialysis to access function of permcath. May consider suture if needed.   2.  End-stage renal disease on hemodialysis.  Last treatment on Wednesday. Will perform dialysis today and access permcath function.   3. Anemia of chronic kidney disease Lab Results  Component Value Date   HGB 8.7 (L) 05/23/2024    Hgb slightly decreased. Will order low dose EPO 4000 units with dialysis.  4. Secondary Hyperparathyroidism: with outpatient labs: Unavailable  Lab Results  Component Value Date   PTH 31 08/30/2018   CALCIUM  8.8 (L)  05/23/2024   PHOS 4.2 12/23/2023    Will continue to monitor bone minerals.   LOS: 0 [name] 8/11/20251:20 PM

## 2024-05-23 NOTE — Progress Notes (Signed)
 Hemodialysis Note:  Received patient in bed to unit. Alert and oriented. Informed consent singed and in chart.  Treatment initiated: 1421 Treatment completed: 1839  Access used: Right internal jugular catheter Access issues: None  Patient tolerated well. Transported back to room, alert without acute distress. Report given to patient's RN.  Total UF removed: 1 liter Medications given: None  Post HD weight: unable to get weight, bed scale not working  [facility]

## 2024-05-23 NOTE — Consult Note (Signed)
 Hospital Consult    Reason for Consult:  Bleeding from Dialysis Catheter Requesting Physician:  Dr Oneil Budge MD  MRN #:  [id number]  History of Present Illness: This is a 63 y.o. male who presents to [facility] emergency department this morning from [facility] dialysis clinic due to some oozing from around his newly placed dialysis permacatheter.  Patient was last seen on 05/20/2024 in the vascular lab here at [facility].  He had a broken It his dialysis permacatheter this making it inoperable.  On Friday, 05/20/2024 patient underwent catheter exchange.  Patient was noted to have hematoma surrounding the exchange site on Friday.  Was sent home with extra dressing.  Patient presented to [facility] this morning with oozing from around the dressing and was sent directly to the emergency department.  Vascular surgery consulted to evaluate.  Past Medical History:  Diagnosis Date   AKI (acute kidney injury) (HCC) 08/30/2018   Diabetes mellitus without complication (HCC)    no meds since weight loss   Dialysis patient (HCC)    Mon, Wed, Fri   Hyperkalemia 02/04/2021   Hypertension    Metabolic acidosis, increased anion gap 02/04/2021   Renal disorder    Sepsis without acute organ dysfunction (HCC) 11/30/2023    Past Surgical History:  Procedure Laterality Date   CATARACT EXTRACTION W/PHACO Right 10/08/2023   Procedure: CATARACT EXTRACTION PHACO AND INTRAOCULAR LENS PLACEMENT (IOC) RIGHT DIABETIC 12.19 01:08.8;  Surgeon: Enola Feliciano Hugger, MD;  Location: [facility];  Service: Ophthalmology;  Laterality: Right;   CATARACT EXTRACTION W/PHACO Left 10/22/2023   Procedure: CATARACT EXTRACTION PHACO AND INTRAOCULAR LENS PLACEMENT (IOC) LEFT DIABETIC MALYUGIN;  Surgeon: Enola Feliciano Hugger, MD;  Location: [facility];  Service: Ophthalmology;  Laterality: Left;  7.66 0:54.2   COLONOSCOPY WITH PROPOFOL  N/A 11/21/2021   Procedure: COLONOSCOPY WITH PROPOFOL ;  Surgeon: Unk Corinn Skiff, MD;  Location: [facility] END[facility]COPY;  Service: Gastroenterology;  Laterality: N/A;   DIALYSIS/PERMA CATHETER INSERTION Right 05/20/2024   Procedure: DIALYSIS/PERMA CATHETER INSERTION;  Surgeon: Marea Selinda RAMAN, MD;  Location: [facility] INVASIVE CV LAB;  Service: Cardiovascular;  Laterality: Right;   DIALYSIS/PERMA CATHETER REPAIR N/A 12/15/2023   Procedure: DIALYSIS/PERMA CATHETER REPAIR;  Surgeon: Jama Cordella MATSU, MD;  Location: [facility] INVASIVE CV LAB;  Service: Cardiovascular;  Laterality: N/A;   ESOPHAGOGASTRODUODENOSCOPY N/A 03/03/2024   Procedure: EGD (ESOPHAGOGASTRODUODENOSCOPY);  Surgeon: Unk Corinn Skiff, MD;  Location: [facility] ENDOSCOPY;  Service: Gastroenterology;  Laterality: N/A;   ESOPHAGOGASTRODUODENOSCOPY (EGD) WITH PROPOFOL  N/A 11/21/2021   Procedure: ESOPHAGOGASTRODUODENOSCOPY (EGD) WITH PROPOFOL ;  Surgeon: Unk Corinn Skiff, MD;  Location: [facility] ENDOSCOPY;  Service: Gastroenterology;  Laterality: N/A;   INSERTION OF DIALYSIS CATHETER     LEG SURGERY  1987    No Known Allergies  Prior to Admission medications   Medication Sig Start Date End Date Taking? Authorizing Provider  albumin  human 25 % bottle Inject 25 grams no matter what if more then 5L are removed then another 25 grams 12/31/23   Vanga, Corinn Skiff, MD  apixaban  (ELIQUIS ) 2.5 MG TABS tablet Take 1 tablet (2.5 mg total) by mouth 2 (two) times daily. 12/23/23 05/20/24  Awanda City, MD  blood glucose meter kit and supplies KIT Dispense based on patient and insurance preference. Use up to four times daily as directed. (FOR ICD-9 250.00, 250.01). 09/01/18   Tobie Press, MD  ciprofloxacin  (CIPRO ) 500 MG tablet Take 1 tablet (500 mg total) by mouth daily. Patient not taking: Reported on 03/03/2024 01/18/24  Unk Corinn Skiff, MD  epoetin  alfa-epbx (RETACRIT ) 10000 UNIT/ML injection Inject 1 mL (10,000 Units total) into the vein every Monday, Wednesday, and Friday with hemodialysis. 12/25/23   Awanda City, MD  feeding supplement  (ENSURE ENLIVE / ENSURE PLUS) LIQD Take 237 mLs by mouth 3 (three) times daily between meals. 12/23/23   Awanda City, MD  irbesartan  (AVAPRO ) 300 MG tablet Take 1 tablet (300 mg total) by mouth daily. 12/23/23 05/20/24  Awanda City, MD  melatonin 5 MG TABS Take 1 tablet (5 mg total) by mouth at bedtime as needed. 12/23/23   Awanda City, MD  omeprazole  (PRILOSEC) 40 MG capsule TAKE 1 CAPSULE BY MOUTH ONCE DAILY. THEN DECREASE TO 20 MG DAILY 04/12/24   Vanga, Rohini Reddy, MD  VITAMIN E PO Take by mouth.    [provider]    Social History   Socioeconomic History   Marital status: Single    Spouse name: Not on file   Number of children: Not on file   Years of education: Not on file   Highest education level: Not on file  Occupational History   Not on file  Tobacco Use   Smoking status: Former    Types: Cigarettes   Smokeless tobacco: Never  Vaping Use   Vaping status: Never Used  Substance and Sexual Activity   Alcohol use: Yes    Alcohol/week: 7.0 standard drinks of alcohol    Types: 7 Cans of beer per week    Comment: no drinking 10/2023   Drug use: Not Currently    Types: Cocaine , Marijuana    Comment: 09/24/23 - Marijuana 3x/week. No cocaine  since Friday 08/27/2018   Sexual activity: Not on file  Other Topics Concern   Not on file  Social History Narrative   Not on file   Social Drivers of Health   Financial Resource Strain: Low Risk  (05/15/2020)   Received [facility]   Overall Financial Resource Strain (CARDIA)    Difficulty of Paying Living Expenses: Not very hard  Food Insecurity: Patient Declined (11/28/2023)   Hunger Vital Sign    Worried About Running Out of Food in the Last Year: Patient declined    Ran Out of Food in the Last Year: Patient declined  Transportation Needs: Patient Declined (11/28/2023)   PRAPARE - Administrator, Civil Service (Medical): Patient declined    Lack of Transportation (Non-Medical): Patient declined  Physical  Activity: Not on file  Stress: Not on file  Social Connections: Not on file  Intimate Partner Violence: Patient Unable To Answer (11/28/2023)   Humiliation, Afraid, Rape, and Kick questionnaire    Fear of Current or Ex-Partner: Patient unable to answer    Emotionally Abused: Patient unable to answer    Physically Abused: Patient unable to answer    Sexually Abused: Patient unable to answer     Family History  Problem Relation Age of Onset   Diabetes Mellitus II Sister    Diabetes Mellitus II Paternal Grandmother     ROS: Otherwise negative unless mentioned in HPI  Physical Examination  Vitals:   05/23/24 0722  BP: 121/86  Pulse: 71  Resp: 18  Temp: 98 F (36.7 C)  SpO2: 100%   Body mass index is 21.44 kg/m.  General:  WDWN in NAD Gait: Not observed HENT: WNL, normocephalic Pulmonary: normal non-labored breathing, without Rales, rhonchi,  wheezing Cardiac: irregular, Hx of atrial fibrillation, without  Murmurs, rubs or gallops; without carotid bruits  Abdomen: Positive bowel sounds throughout,  soft, NT/ND, no masses Skin: without rashes Vascular Exam/Pulses: Palpable pulses throughout and all extremities are warm to touch.  Extremities: without ischemic changes, without Gangrene , without cellulitis; without open wounds;  Musculoskeletal: no muscle wasting or atrophy  Neurologic: A&O X 3;  No focal weakness or paresthesias are detected; speech is fluent/normal Psychiatric:  The pt has Normal affect. Lymph:  Unremarkable  CBC    Component Value Date/Time   WBC 6.3 05/23/2024 0731   RBC 3.25 (L) 05/23/2024 0731   HGB 8.7 (L) 05/23/2024 0731   HGB 9.0 (L) 11/04/2021 1319   HCT 26.9 (L) 05/23/2024 0731   HCT 25.9 (L) 11/04/2021 1319   PLT 184 05/23/2024 0731   PLT 145 (L) 11/04/2021 1319   MCV 82.8 05/23/2024 0731   MCV 92 11/04/2021 1319   MCH 26.8 05/23/2024 0731   MCHC 32.3 05/23/2024 0731   RDW 16.7 (H) 05/23/2024 0731   RDW 14.2 11/04/2021 1319    LYMPHSABS 0.6 (L) 05/20/2024 0830   MONOABS 0.6 05/20/2024 0830   EOSABS 0.1 05/20/2024 0830   BASOSABS 0.0 05/20/2024 0830    BMET    Component Value Date/Time   NA 134 (L) 05/23/2024 0731   NA 142 11/04/2021 1319   K 3.0 (L) 05/23/2024 0731   CL 102 05/23/2024 0731   CO2 20 (L) 05/23/2024 0731   GLUCOSE 88 05/23/2024 0731   BUN 66 (H) 05/23/2024 0731   BUN 16 11/04/2021 1319   CREATININE 7.91 (H) 05/23/2024 0731   CALCIUM  8.8 (L) 05/23/2024 0731   GFRNONAA 7 (L) 05/23/2024 0731   GFRAA 15 (L) 09/01/2018 0412    COAGS: Lab Results  Component Value Date   INR 1.3 (H) 05/23/2024   INR 1.2 12/31/2023   INR 1.9 (H) 11/26/2023     Non-Invasive Vascular Imaging:   None Ordered  Statin:  No. Beta Blocker:  No. Aspirin:  No. ACEI:  No. ARB:  No. CCB use:  No Other antiplatelets/anticoagulants:  Yes.   Eliquis  2.5 mg BID   ASSESSMENT/PLAN: This is a 63 y.o. male who presents to [facility] emergency department this morning due to oozing from around his newly placed dialysis permacatheter on 05/20/2024.  Upon examination this morning dressing had already been taken down.  He did have remaining clot around the line and the entrance site itself.  I did not however see any oozing from the site itself.  Plan is to take the patient to dialysis this afternoon to make sure that the newly placed dialysis permacatheter is working well.  We can reevaluate in hemodialysis to see if the patient loses during his treatment.  If so I can place a stitch around the exit point of the catheter to try to secure.  If not patient can have dressing redone post his dialysis and be discharged afterwards.  No other procedures needed at this time.  I asked Chantel breeze NP hemodialysis to let me know the patient's status after completing hemodialysis today.   -I discussed the case in detail with Dr. Selinda Gu MD and he agrees with plan.   Gwendlyn JONELLE [facility] 05/23/2024 9:33 AM

## 2024-05-23 NOTE — Discharge Instructions (Signed)
 Please continue with your regular dialysis plan.  Return to the ER right away if you have further bleeding or other concerns around the site such as swelling, develop difficulty breathing, chest pain, or other concerns arise.

## 2024-05-23 NOTE — ED Provider Notes (Addendum)
 [facility] Provider Note    Event Date/Time   First MD Initiated Contact with Patient 05/23/24 [phone]     (approximate)   History   Vascular Access Problem   HPI  JAMAHL Franco is a 63 y.o. male history of end-stage renal disease  Patient had his right subclavian hemodialysis catheter placed on Friday.  Since going home he replaced its continue to ooze blood.  This continue to leak small amounts of blood from around the catheter site over the weekend.  He went to hemodialysis today they were not able to do hemodialysis because it was bleeding  Patient relates that he is not any shortness of breath.  It is not bloody large amount but continues to just ooze  No pain or discomfort.  Has not had hemodialysis for several days     Physical Exam   Triage Vital Signs: ED Triage Vitals  Encounter Vitals Group     BP 05/23/24 0722 121/86     Girls Systolic BP Percentile --      Girls Diastolic BP Percentile --      Boys Systolic BP Percentile --      Boys Diastolic BP Percentile --      Pulse Rate 05/23/24 0722 71     Resp 05/23/24 0722 18     Temp 05/23/24 0722 98 F (36.7 C)     Temp Source 05/23/24 0722 Oral     SpO2 05/23/24 0722 100 %     Weight 05/23/24 0721 158 lb 1.1 oz (71.7 kg)     Height --      Head Circumference --      Peak Flow --      Pain Score 05/23/24 0719 0     Pain Loc --      Pain Education --      Exclude from Growth Chart --     Most recent vital signs: Vitals:   05/23/24 1500 05/23/24 1530  BP: 135/81 (!) 150/103  Pulse: 72 79  Resp: (!) 23 (!) 24  Temp:    SpO2: 95% 97%     General: Awake, no distress.  CV:  Good peripheral perfusion.  Resp:  Normal effort.  Abd:  No distention.  Other:  Right upper chest, dialysis catheter has food very slight amount of clotted blood surrounding it, also some clotted blood noted on the right side of his shirt.  Additionally with dressings removed there is very scant amount  of slow venous type oozing at the skin insertion site.  No surrounding hematoma tenderness erythema or other obvious complicating factor.  No arterial bleeding   ED Results / Procedures / Treatments   Labs (all labs ordered are listed, but only abnormal results are displayed) Labs Reviewed  CBC - Abnormal; Notable for the following components:      Result Value   RBC 3.25 (*)    Hemoglobin 8.7 (*)    HCT 26.9 (*)    RDW 16.7 (*)    All other components within normal limits  PROTIME-INR - Abnormal; Notable for the following components:   Prothrombin Time 17.0 (*)    INR 1.3 (*)    All other components within normal limits  BASIC METABOLIC PANEL WITH GFR - Abnormal; Notable for the following components:   Sodium 134 (*)    Potassium 3.0 (*)    CO2 20 (*)    BUN 66 (*)    Creatinine, Ser 7.91 (*)  Calcium  8.8 (*)    GFR, Estimated 7 (*)    All other components within normal limits  HEPATITIS B SURFACE ANTIGEN         RADIOLOGY     PROCEDURES:  Critical Care performed: No  Procedures   MEDICATIONS ORDERED IN ED: Medications  Chlorhexidine  Gluconate Cloth 2 % PADS 6 each (has no administration in time range)  heparin  injection 1,000 Units (has no administration in time range)  alteplase  (CATHFLO ACTIVASE ) injection 2 mg (has no administration in time range)     IMPRESSION / MDM / ASSESSMENT AND PLAN / ED COURSE  I reviewed the triage vital signs and the nursing notes.                              Differential diagnosis includes, but is not limited to, hemodialysis catheter malfunction, bleeding disorder/dyscrasia, etc.  Patient reports he typically takes Eliquis  but has been holding it over the weekend due to his recent vascular procedure.  He is hemodynamically stable  Fully awake alert reassuring clinical picture but concerns of ongoing bleeding after 3 days after replacement of his hemodialysis catheter.  Consult placed with vascular surgery Dr.  Stevan at 735am for further assessment.   Patient's presentation is most consistent with acute complicated illness / injury requiring diagnostic workup.      Clinical Course as of 05/23/24 1554  Mon May 23, 2024  [id number] [place] pace of vascular surgery is seen evaluate the patient.  They advised no significant bleeding at this time, additional bandaging placed.  Recommend that he undergo hemodialysis and observe for any bleeding or complication.  Patient agreeable to proceed with hemodialysis, Dr. Lazarus being consulted.  Anticipate discharge to home if hemodialysis session goes well without concern [MQ]  1006 Patient awaiting hemodialysis session. Dr. Marcelino aware / has been consulted.  [MQ]    Clinical Course User Index [MQ] [name], MD   Ongoing disposition assigned to Dr. Hobert.  Will follow-up on reassessment upon patient return from hemodialysis.  If continues to go well, which thus far has been reported by NP Druscilla, likely discharged to continue follow-up with hemodialysis and bleeding precautions/precautions around his catheter.  Dr. Jama advised no further concerns from the vascular perspective at this point.    FINAL CLINICAL IMPRESSION(S) / ED DIAGNOSES   Final diagnoses:  Complication associated with dialysis catheter     Rx / DC Orders   ED Discharge Orders     None        Note:  This document was prepared using Dragon voice recognition software and may include unintentional dictation errors.   [name], MD 05/23/24 1554    [name], MD 05/23/24 1555

## 2024-05-23 NOTE — ED Provider Notes (Signed)
-----------------------------------------   7:30 PM on 05/23/2024 ----------------------------------------- Patient has returned from dialysis.  He has no complaints, states he feels well.  No bleeding.  Patient states he is ready go home and is requesting discharge.  Will discharge the patient per his wishes as he appears well otherwise.  Will follow-up with his normal dialysis schedule.   [name], MD 05/23/24 1930

## 2024-06-06 NOTE — Progress Notes (Unsigned)
 Cardiology Office Note  Date:  06/07/2024   ID:  Casey Franco, DOB 1961-07-18, MRN [id number]  PCP:  [facility]   Chief Complaint  Patient presents with   Atrial Fibrillation   Consult    Patient c/o shortness of breath & bilateral LE edema.     HPI:  Casey Franco a 63 y.o. malewith past medical history of: Hypertension End-stage renal disease on dialysis Former Etoh abuse, quit 1/25 Hepatitis C, cirrhosis, ascites, varices Cardiomyopathy ejection fraction 40 to 45% Who presents by referral from Eleanor Lamer for atrial fibrillation/Flutter  Prior records reviewed February 2025 in the hospital for abdominal pain, spontaneous bacterial peritonitis treated with antibiotics for 2 weeks During the hospital course, converted from sinus rhythm to atrial flutter/fib Although high risk from anticoagulation of bleeding from liver disease, kidney disease, he was started on Eliquis  2.5 BID (subtherapeutic dosing)  with Lopressor   Echocardiogram February 2025 EF 40 to 45%, moderate MR Mildly reduced RV function  On further discussion today he reports feeling relatively well No SOB with ambulating,Some SOB when laying down to sleep, denies significant lower extremity edema  On HD on Mon/Wed/Fri New cath port placed 3 weeks ago had trouble with bleeding from right catheter access site for several weeks Stopped eliquis  2 weeks ago, secondary to bleeding Bleeding now resolved, still has not restarted Eliquis   Denies any chest pain concerning for angina Reports long prior history of heavy alcohol intake States that he stopped drinking alcohol January 2025 Significant muscle wasting in his legs, difficulty ambulating  EKG personally reviewed by myself on todays visit EKG Interpretation Date/Time:  Tuesday June 07 2024 09:49:52 EDT Ventricular Rate:  74 PR Interval:  236 QRS Duration:  106 QT Interval:  476 QTC Calculation: 528 R Axis:   18  Text  Interpretation: Atrial flutter Inferior infarct , age undetermined ST & T wave abnormality, consider anterolateral ischemia Prolonged QT When compared with ECG of 26-Nov-2023 11:14, Significant changes have occurred Confirmed by Perla Lye [phone]) on 06/07/2024 9:58:52 AM    PMH:   has a past medical history of AKI (acute kidney injury) (HCC) (08/30/2018), Diabetes mellitus without complication (HCC), Dialysis patient [facility]), Hyperkalemia (02/04/2021), Hypertension, Metabolic acidosis, increased anion gap (02/04/2021), Renal disorder, and Sepsis without acute organ dysfunction (HCC) (11/30/2023).  PSH:    Past Surgical History:  Procedure Laterality Date   CATARACT EXTRACTION W/PHACO Right 10/08/2023   Procedure: CATARACT EXTRACTION PHACO AND INTRAOCULAR LENS PLACEMENT (IOC) RIGHT DIABETIC 12.19 01:08.8;  Surgeon: Enola Feliciano Hugger, MD;  Location: [facility];  Service: Ophthalmology;  Laterality: Right;   CATARACT EXTRACTION W/PHACO Left 10/22/2023   Procedure: CATARACT EXTRACTION PHACO AND INTRAOCULAR LENS PLACEMENT (IOC) LEFT DIABETIC MALYUGIN;  Surgeon: Enola Feliciano Hugger, MD;  Location: [facility];  Service: Ophthalmology;  Laterality: Left;  7.66 0:54.2   COLONOSCOPY WITH PROPOFOL  N/A 11/21/2021   Procedure: COLONOSCOPY WITH PROPOFOL ;  Surgeon: Unk Corinn Skiff, MD;  Location: [facility] ENDOSCOPY;  Service: Gastroenterology;  Laterality: N/A;   DIALYSIS/PERMA CATHETER INSERTION Right 05/20/2024   Procedure: DIALYSIS/PERMA CATHETER INSERTION;  Surgeon: Marea Selinda RAMAN, MD;  Location: [facility] INVASIVE CV LAB;  Service: Cardiovascular;  Laterality: Right;   DIALYSIS/PERMA CATHETER REPAIR N/A 12/15/2023   Procedure: DIALYSIS/PERMA CATHETER REPAIR;  Surgeon: Jama Cordella MATSU, MD;  Location: [facility] INVASIVE CV LAB;  Service: Cardiovascular;  Laterality: N/A;   ESOPHAGOGASTRODUODENOSCOPY N/A 03/03/2024   Procedure: EGD (ESOPHAGOGASTRODUODENOSCOPY);  Surgeon: Unk Corinn Skiff, MD;   Location: [facility]  ENDOSCOPY;  Service: Gastroenterology;  Laterality: N/A;   ESOPHAGOGASTRODUODENOSCOPY (EGD) WITH PROPOFOL  N/A 11/21/2021   Procedure: ESOPHAGOGASTRODUODENOSCOPY (EGD) WITH PROPOFOL ;  Surgeon: Unk Corinn Skiff, MD;  Location: [facility] ENDOSCOPY;  Service: Gastroenterology;  Laterality: N/A;   INSERTION OF DIALYSIS CATHETER     LEG SURGERY  1987    Current Outpatient Medications  Medication Sig Dispense Refill   albumin  human 25 % bottle Inject 25 grams no matter what if more then 5L are removed then another 25 grams     apixaban  (ELIQUIS ) 2.5 MG TABS tablet Take 1 tablet (2.5 mg total) by mouth 2 (two) times daily. 180 tablet 0   blood glucose meter kit and supplies KIT Dispense based on patient and insurance preference. Use up to four times daily as directed. (FOR ICD-9 250.00, 250.01). 1 each 0   epoetin  alfa-epbx (RETACRIT ) 10000 UNIT/ML injection Inject 1 mL (10,000 Units total) into the vein every Monday, Wednesday, and Friday with hemodialysis.     feeding supplement (ENSURE ENLIVE / ENSURE PLUS) LIQD Take 237 mLs by mouth 3 (three) times daily between meals.     irbesartan  (AVAPRO ) 300 MG tablet Take 1 tablet (300 mg total) by mouth daily. 90 tablet 0   melatonin 5 MG TABS Take 1 tablet (5 mg total) by mouth at bedtime as needed. 20 tablet 0   metoprolol  tartrate (LOPRESSOR ) 25 MG tablet Take 25 mg by mouth 2 (two) times daily.     omeprazole  (PRILOSEC) 40 MG capsule TAKE 1 CAPSULE BY MOUTH ONCE DAILY. THEN DECREASE TO 20 MG DAILY 30 capsule 0   VITAMIN E PO Take by mouth.     No current facility-administered medications for this visit.    Allergies:   Patient has no known allergies.   Social History:  The patient  reports that he has quit smoking. His smoking use included cigarettes. He has never used smokeless tobacco. He reports current alcohol use of about 7.0 standard drinks of alcohol per week. He reports that he does not currently use drugs after having used the  following drugs: Cocaine  and Marijuana.   Family History:   family history includes Diabetes Mellitus II in his paternal grandmother and sister.    Review of Systems: Review of Systems  Constitutional: Negative.   HENT: Negative.    Respiratory: Negative.    Cardiovascular: Negative.   Gastrointestinal: Negative.   Musculoskeletal: Negative.   Neurological: Negative.   Psychiatric/Behavioral: Negative.    All other systems reviewed and are negative.   PHYSICAL EXAM: VS:  BP 102/62 (BP Location: Left Arm, Patient Position: Sitting, Cuff Size: Normal)   Pulse 74   Ht 6' (1.829 m)   Wt 146 lb 6 oz (66.4 kg)   SpO2 93%   BMI 19.85 kg/m  , BMI Body mass index is 19.85 kg/m. GEN: Well nourished, well developed, in no acute distress HEENT: normal Neck: no JVD, carotid bruits, or masses Cardiac: RRR; no murmurs, rubs, or gallops,no edema  Respiratory:  clear to auscultation bilaterally, normal work of breathing GI: soft, nontender, nondistended, + BS MS: no deformity or atrophy Skin: warm and dry, no rash Neuro:  Strength and sensation are intact Psych: euthymic mood, full affect  Recent Labs: 12/31/2023: ALT 11 05/23/2024: BUN 66; Creatinine, Ser 7.91; Hemoglobin 8.7; Platelets 184; Potassium 3.0; Sodium 134    Lipid Panel Lab Results  Component Value Date   CHOL 67 11/27/2023   HDL <10 (L) 11/27/2023   LDLCALC NOT CALCULATED 11/27/2023  TRIG 104 11/27/2023    Wt Readings from Last 3 Encounters:  06/07/24 146 lb 6 oz (66.4 kg)  05/23/24 158 lb 1.1 oz (71.7 kg)  05/20/24 158 lb 1.1 oz (71.7 kg)      ASSESSMENT AND PLAN:  Problem List Items Addressed This Visit       Cardiology Problems   Essential hypertension (Chronic)   Relevant Medications   metoprolol  tartrate (LOPRESSOR ) 25 MG tablet   Other Relevant Orders   EKG 12-Lead (Completed)   Hypertensive heart and chronic kidney disease with heart failure and with stage 5 chronic kidney disease, or end stage  renal disease (HCC)   Relevant Medications   metoprolol  tartrate (LOPRESSOR ) 25 MG tablet   Other Relevant Orders   EKG 12-Lead (Completed)   Paroxysmal atrial fibrillation (HCC) - Primary   Relevant Medications   metoprolol  tartrate (LOPRESSOR ) 25 MG tablet   Other Relevant Orders   EKG 12-Lead (Completed)     Other   ESRD (end stage renal disease) (HCC) (Chronic)   Chronic hepatitis C without hepatic coma (HCC) (Chronic)   Atrial fibrillation/flutter Noted February 2025 on hospitalization for peritonitis requiring antibiotics At that time was started on Eliquis  2.5 twice daily, subtherapeutic dosing Currently is asymptomatic, rate controlled on metoprolol  Reports he is not on Eliquis  for the past 2 weeks as he had bleeding around his right HD access site/catheter.  Suggested he try to restart his Eliquis  at least 2.5 twice daily for now.  No immediate plans for cardioversion given he is not on anticoagulation and he is asymptomatic  Cardiomyopathy Mildly reduced ejection fraction in February 2025 in the setting of peritonitis -We have recommended repeat echocardiogram to help guide management For severely depressed ejection fraction may need to consider discussion on restoring normal sinus rhythm.  May also need to consider ischemic workup Currently without anginal symptoms Tolerating metoprolol  tartrate with irbesartan , low blood pressure limiting further goal-directed medical therapy  End-stage renal disease on hemodialysis On HD 3 days a week Monday Wednesday Friday Recommend he hold irbesartan  mornings before dialysis given low blood pressure Reports he does not make any urine, diuretics would likely be of little clinical benefit   Signed, Velinda Lunger, M.D., Ph.D. [facility] [place], [place] [phone]

## 2024-06-07 ENCOUNTER — Encounter: Payer: Self-pay | Admitting: Cardiovascular Disease

## 2024-06-07 ENCOUNTER — Ambulatory Visit: Attending: Cardiovascular Disease | Admitting: Cardiovascular Disease

## 2024-06-07 VITALS — BP 102/62 | HR 74 | Ht 72.0 in | Wt 146.4 lb

## 2024-06-07 DIAGNOSIS — B182 Chronic viral hepatitis C: Secondary | ICD-10-CM

## 2024-06-07 DIAGNOSIS — I10 Essential (primary) hypertension: Secondary | ICD-10-CM

## 2024-06-07 DIAGNOSIS — N186 End stage renal disease: Secondary | ICD-10-CM | POA: Diagnosis not present

## 2024-06-07 DIAGNOSIS — I480 Paroxysmal atrial fibrillation: Secondary | ICD-10-CM | POA: Diagnosis not present

## 2024-06-07 DIAGNOSIS — I132 Hypertensive heart and chronic kidney disease with heart failure and with stage 5 chronic kidney disease, or end stage renal disease: Secondary | ICD-10-CM

## 2024-06-07 NOTE — Patient Instructions (Addendum)
 Medication Instructions:   Please restart eliquis  twice a day  If you need a refill on your cardiac medications before your next appointment, please call your pharmacy.   Lab work: No new labs needed  Testing/Procedures:  Your physician has requested that you have an echocardiogram. Echocardiography is a painless test that uses sound waves to create images of your heart. It provides your doctor with information about the size and shape of your heart and how well your heart's chambers and valves are working.   You may receive an ultrasound enhancing agent through an IV if needed to better visualize your heart during the echo. This procedure takes approximately one hour.  There are no restrictions for this procedure.  This will take place at [address] [facility]) [address], [place] [address]  Please note: We ask at that you not bring children with you during ultrasound (echo/ vascular) testing. Due to room size and safety concerns, children are not allowed in the ultrasound rooms during exams. Our front office staff cannot provide observation of children in our lobby area while testing is being conducted. An adult accompanying a patient to their appointment will only be allowed in the ultrasound room at the discretion of the ultrasound technician under special circumstances. We apologize for any inconvenience.   Follow-Up: At [facility], you and your health needs are our priority.  As part of our continuing mission to provide you with exceptional heart care, we have created designated Provider Care Teams.  These Care Teams include your primary Cardiologist (physician) and Advanced Practice Providers (APPs -  Physician Assistants and Nurse Practitioners) who all work together to provide you with the care you need, when you need it.  You will need a follow up appointment in 6 months  Providers on your designated Care Team:   Lonni Meager, NP Bernardino Bring, PA-C [place]  [place], [place]  COVID-19 Vaccine Information can be found at: [email or web address] For questions related to vaccine distribution or appointments, please email [email or web address] or call [phone].

## 2024-07-19 ENCOUNTER — Other Ambulatory Visit

## 2024-08-11 ENCOUNTER — Ambulatory Visit: Attending: Cardiovascular Disease

## 2024-08-11 DIAGNOSIS — I480 Paroxysmal atrial fibrillation: Secondary | ICD-10-CM

## 2024-08-11 LAB — ECHOCARDIOGRAM COMPLETE
AR max vel: 2.11 cm2
AV Area VTI: 1.85 cm2
AV Area mean vel: 2.01 cm2
AV Mean grad: 3 mmHg
AV Peak grad: 5.5 mmHg
Ao pk vel: 1.17 m/s
Area-P 1/2: 3.53 cm2
MV M vel: 5.48 m/s
MV Peak grad: 120.1 mmHg
Radius: 1 cm
S' Lateral: 3.23 cm

## 2024-08-13 ENCOUNTER — Ambulatory Visit: Payer: Self-pay | Admitting: Cardiovascular Disease

## 2024-08-16 ENCOUNTER — Other Ambulatory Visit: Payer: Self-pay | Admitting: Emergency Medicine

## 2024-08-16 MED ORDER — APIXABAN 5 MG PO TABS: 5.0000 mg | ORAL_TABLET | Freq: Two times a day (BID) | ORAL | 1 refills | Status: AC

## 2024-08-30 ENCOUNTER — Encounter: Payer: Self-pay | Admitting: Physician Assistant

## 2024-08-30 ENCOUNTER — Ambulatory Visit: Attending: Physician Assistant | Admitting: Physician Assistant

## 2024-08-30 ENCOUNTER — Ambulatory Visit

## 2024-08-30 ENCOUNTER — Other Ambulatory Visit
Admission: RE | Admit: 2024-08-30 | Discharge: 2024-08-30 | Disposition: A | Discharge status: Home / Self Care | Source: Ambulatory Visit | Attending: Physician Assistant | Admitting: Physician Assistant

## 2024-08-30 ENCOUNTER — Ambulatory Visit: Payer: Self-pay | Admitting: Physician Assistant

## 2024-08-30 VITALS — BP 122/72 | HR 72 | Ht 72.0 in | Wt 148.2 lb

## 2024-08-30 DIAGNOSIS — I480 Paroxysmal atrial fibrillation: Secondary | ICD-10-CM | POA: Diagnosis present

## 2024-08-30 DIAGNOSIS — N186 End stage renal disease: Secondary | ICD-10-CM

## 2024-08-30 DIAGNOSIS — I10 Essential (primary) hypertension: Secondary | ICD-10-CM | POA: Insufficient documentation

## 2024-08-30 DIAGNOSIS — Z79899 Other long term (current) drug therapy: Secondary | ICD-10-CM | POA: Diagnosis not present

## 2024-08-30 DIAGNOSIS — I5020 Unspecified systolic (congestive) heart failure: Secondary | ICD-10-CM | POA: Diagnosis not present

## 2024-08-30 LAB — CBC
HCT: 28.4 % — ABNORMAL LOW (ref 39.0–52.0)
Hemoglobin: 8.8 g/dL — ABNORMAL LOW (ref 13.0–17.0)
MCH: 27.6 pg (ref 26.0–34.0)
MCHC: 31 g/dL (ref 30.0–36.0)
MCV: 89 fL (ref 80.0–100.0)
Platelets: 162 K/uL (ref 150–400)
RBC: 3.19 MIL/uL — ABNORMAL LOW (ref 4.22–5.81)
RDW: 17.1 % — ABNORMAL HIGH (ref 11.5–15.5)
WBC: 7.1 K/uL (ref 4.0–10.5)
nRBC: 0 % (ref 0.0–0.2)

## 2024-08-30 LAB — BASIC METABOLIC PANEL WITH GFR
Anion gap: 12 (ref 5–15)
BUN: 39 mg/dL — ABNORMAL HIGH (ref 8–23)
CO2: 27 mmol/L (ref 22–32)
Calcium: 9 mg/dL (ref 8.9–10.3)
Chloride: 101 mmol/L (ref 98–111)
Creatinine, Ser: 4.57 mg/dL — ABNORMAL HIGH (ref 0.61–1.24)
GFR, Estimated: 14 mL/min — ABNORMAL LOW (ref >60)
Glucose, Bld: 75 mg/dL (ref 70–99)
Potassium: 3.7 mmol/L (ref 3.5–5.1)
Sodium: 139 mmol/L (ref 135–145)

## 2024-08-30 NOTE — Progress Notes (Signed)
 Cardiology Office Note    Date:  08/30/2024   ID:  Casey Franco, DOB October 11, 1961, MRN [id number]  PCP:  [facility]  Cardiologist:  None  Electrophysiologist:  None   Chief Complaint: Follow up  History of Present Illness:   Casey Franco is a 63 y.o. male with history of hypertension, ESRD on dialysis, prior alcohol use, hepatitis C, liver cirrhosis secondary to HCV, ascites, varices, heart failure with mildly reduced ejection fraction 40 to 45%, and atrial fibrillation who presents for follow-up on CHF and A-fib.    Patient was hospitalized 11/26/2023 through 12/23/2023, originally presenting with 3 weeks of abdominal pain.  He was found to have spontaneous bacterial peritonitis and completed 2 weeks of antibiotics.  Troponin was mildly elevated and downtrending, felt likely due to demand ischemia. During admission, he had an episode of atrial fibrillation for which he was started on metoprolol .  Given high risk for bleeding due to liver disease and kidney disease, he was started on subtherapeutic dose of Eliquis  at 2.5 mg twice daily.  Echo showed EF 40 to 45% and moderate concentric LVH with mildly reduced RV systolic function, moderately enlarged RV size, moderately increased RV wall thickness, small pericardial effusion, and moderate MR.    Patient was referred to Dr. Gollan and evaluated 06/07/2024.  He reported feeling relatively well with some orthopnea.  He denied dyspnea on exertion and lower extremity swelling.  He had issues with bleeding from his right catheter access site for several weeks and had stopped Eliquis  2 weeks prior to that visit.  Bleeding had since resolved but he did not resume Eliquis .  He was noted to be in atrial flutter with rate well-controlled at 74 bpm.  It was recommended that he resume Eliquis  at 2.5 mg twice daily.  There were no immediate plans to pursue cardioversion given that he was septic therapeutically anticoagulated and  asymptomatic to his A-fib.  Echo 08/11/2024 showed EF 35 to 40% with global hypokinesis, mild LVH, severely reduced RV systolic function, severely enlarged RV size, moderately dilated LA, and moderate to severe MR.  Patient was called given these findings and recommended to increase Eliquis  to 5 mg twice daily in an effort to pursue cardioversion after 3 weeks.  Patient presents today overall doing reasonably well from a cardiac perspective. He reports low energy levels but is determined to start walking for exercise. He reports dyspnea when walking long distances but notices the more often he walks the less short of breath he gets. He has occasional orthopnea and PND, approximately once every other week.  He reports some abdominal fullness and lower extremity swelling which are improved after dialysis.  He denies chest pain and palpitations.  EKG today shows sinus rhythm with first-degree AV block.  He is compliant with Eliquis  and denies issues with bleeding and hematochezia.  Labs independently reviewed: 05/2024-sodium 134, potassium 3.0, BUN 66, creatinine 7.91, Hgb 8.7, HCT 26.9, platelets 184 11/2023-TC 67, HDL less than 10, TG 104, LDL unable to be calculated  Objective   Past Medical History:  Diagnosis Date   AKI (acute kidney injury) 08/30/2018   Diabetes mellitus without complication (HCC)    no meds since weight loss   Dialysis patient    Mon, Wed, Fri   Hyperkalemia 02/04/2021   Hypertension    Metabolic acidosis, increased anion gap 02/04/2021   Renal disorder    Sepsis without acute organ dysfunction (HCC) 11/30/2023    Current Medications: Current  Meds  Medication Sig   albumin  human 25 % bottle Inject 25 grams no matter what if more then 5L are removed then another 25 grams   apixaban  (ELIQUIS ) 5 MG TABS tablet Take 1 tablet (5 mg total) by mouth 2 (two) times daily.   blood glucose meter kit and supplies KIT Dispense based on patient and insurance preference. Use up to  four times daily as directed. (FOR ICD-9 250.00, 250.01).   epoetin  alfa-epbx (RETACRIT ) 10000 UNIT/ML injection Inject 1 mL (10,000 Units total) into the vein every Monday, Wednesday, and Friday with hemodialysis.   feeding supplement (ENSURE ENLIVE / ENSURE PLUS) LIQD Take 237 mLs by mouth 3 (three) times daily between meals.   melatonin 5 MG TABS Take 1 tablet (5 mg total) by mouth at bedtime as needed.   metoprolol  tartrate (LOPRESSOR ) 25 MG tablet Take 25 mg by mouth 2 (two) times daily.   omeprazole  (PRILOSEC) 40 MG capsule TAKE 1 CAPSULE BY MOUTH ONCE DAILY. THEN DECREASE TO 20 MG DAILY    Allergies:   Patient has no known allergies.   Social History   Socioeconomic History   Marital status: Single    Spouse name: Not on file   Number of children: Not on file   Years of education: Not on file   Highest education level: Not on file  Occupational History   Not on file  Tobacco Use   Smoking status: Former    Types: Cigarettes   Smokeless tobacco: Never  Vaping Use   Vaping status: Never Used  Substance and Sexual Activity   Alcohol use: Yes    Alcohol/week: 7.0 standard drinks of alcohol    Types: 7 Cans of beer per week    Comment: no drinking 10/2023   Drug use: Not Currently    Types: Cocaine , Marijuana    Comment: 09/24/23 - Marijuana 3x/week. No cocaine  since Friday 08/27/2018   Sexual activity: Not on file  Other Topics Concern   Not on file  Social History Narrative   Not on file   Social Drivers of Health   Financial Resource Strain: Low Risk (05/15/2020)   Received [facility]   Overall Financial Resource Strain (CARDIA)    Difficulty of Paying Living Expenses: Not very hard  Food Insecurity: Patient Declined (11/28/2023)   Hunger Vital Sign    Worried About Running Out of Food in the Last Year: Patient declined    Ran Out of Food in the Last Year: Patient declined  Transportation Needs: Patient Declined (11/28/2023)   PRAPARE - Therapist, Art (Medical): Patient declined    Lack of Transportation (Non-Medical): Patient declined  Physical Activity: Not on file  Stress: Not on file  Social Connections: Not on file     Family History:  The patient's family history includes Diabetes Mellitus II in his paternal grandmother and sister.  ROS:   12-point review of systems is negative unless otherwise noted in the HPI.  EKGs/Other Studies Reviewed:    Studies reviewed were summarized above. The additional studies were reviewed today:  08/11/2024 Echo complete 1. Left ventricular ejection fraction, by estimation, is 35 to 40%. The  left ventricle has moderately decreased function. The left ventricle  demonstrates global hypokinesis. There is mild left ventricular  hypertrophy. Left ventricular diastolic  parameters are indeterminate. There is the interventricular septum is  flattened in systole, consistent with right ventricular pressure overload.  The average left ventricular global longitudinal  strain is -7.3 %. The  global longitudinal strain is  abnormal.   2. Right ventricular systolic function is severely reduced. The right  ventricular size is severely enlarged. There is normal pulmonary artery  systolic pressure. The estimated right ventricular systolic pressure is  30.2 mmHg.   3. Left atrial size was moderately dilated.   4. Right atrial size was severely dilated.   5. The mitral valve is normal in structure. Moderate to severe mitral  valve regurgitation. No evidence of mitral stenosis. Moderate to severe  mitral annular calcification.   6. Tricuspid valve regurgitation is mild to moderate.   7. The aortic valve is calcified. Aortic valve regurgitation is not  visualized. Aortic valve sclerosis/calcification is present, without any  evidence of aortic stenosis. Aortic valve mean gradient measures 3.0 mmHg.   8. The inferior vena cava is dilated in size with <50% respiratory  variability,  suggesting right atrial pressure of 15 mmHg.   11/2023 Echo complete 1. Left ventricular ejection fraction, by estimation, is 40 to 45%. The  left ventricle has mildly decreased function. The left ventricle  demonstrates regional wall motion abnormalities (see scoring  diagram/findings for description). The left ventricular   internal cavity size was mildly to moderately dilated. There is moderate  concentric left ventricular hypertrophy. Indeterminate diastolic filling  due to E-A fusion. The global longitudinal strain is abnormal.   2. T.   3. Right ventricular systolic function is mildly reduced. The right  ventricular size is moderately enlarged. Moderately increased right  ventricular wall thickness.   4. Right atrial size was moderately dilated.   5. A small pericardial effusion is present. The pericardial effusion is  circumferential.   6. The mitral valve is grossly normal. Moderate mitral valve  regurgitation.   7. The aortic valve is calcified. Aortic valve regurgitation is not  visualized. Aortic valve sclerosis/calcification is present, without any  evidence of aortic stenosis.   EKG:  EKG personally reviewed by me today EKG Interpretation Date/Time:  Tuesday August 30 2024 09:46:57 EST Ventricular Rate:  72 PR Interval:  230 QRS Duration:  116 QT Interval:  472 QTC Calculation: 516 R Axis:   41  Text Interpretation: Sinus rhythm with 1st degree A-V block with occasional Premature ventricular complexes Incomplete left bundle branch block ST & T wave abnormality, consider anterolateral ischemia Confirmed by Lorene Sinclair ([id number]) on 08/30/2024 9:53:53 AM  PHYSICAL EXAM:    VS:  BP 122/72 (BP Location: Left Arm, Patient Position: Sitting, Cuff Size: Normal)   Pulse 72   Ht 6' (1.829 m)   Wt 148 lb 3.2 oz (67.2 kg)   SpO2 95%   BMI 20.10 kg/m   BMI: Body mass index is 20.1 kg/m.  GEN: Well nourished, well developed in no acute distress NECK: No JVD; No carotid  bruits CARDIAC: RRR, no murmurs, rubs, gallops RESPIRATORY:  Clear to auscultation without rales, wheezing or rhonchi  ABDOMEN: Soft, non-tender, non-distended EXTREMITIES: No edema; No deformity  Wt Readings from Last 3 Encounters:  08/30/24 148 lb 3.2 oz (67.2 kg)  06/07/24 146 lb 6 oz (66.4 kg)  05/23/24 158 lb 1.1 oz (71.7 kg)                  ASSESSMENT & PLAN:   Paroxysmal atrial fibrillation/flutter - Noted during admission in 12/2023 during which he was started on subtherapeutic Eliquis  due to high risk for bleeding and anemia of chronic disease. EF 35-40% on 05/2024. Eliquis  was increased  to therapeutic dose on 11/4 for CHA2DS2VASc 4. EKG today shows sinus rhythm with first degree AV block. Recommend 2 week Zio XT to assess afib burden to further guide management. He is continued on Eliquis  5 mg twice daily and metoprolol  tartrate 25 mg twice daily. Denies bleeding and hematochezia. Update CBC and BMP.   HFrEF - Echo during admission showed EF 40-45%. Repeat echo 07/2024 showed EF 35-40% with mod to severe MR and severely reduced RV function. Suspect cardiomyopathy is in the setting of atrial fibrillation/flutter. He appears relatively euvolemic on exam today. Volume is managed with dialysis. Recommend obtaining Lexiscan Myoview to assess for ischemic cause of cardiomyopathy. He is continued on irbesartan  300 mg daily and metoprolol  as above. No SGLT2i or MRA given renal dysfunction.   Hypertension - BP is well controlled on current regimen of irbesartan  and metoprolol .   ESRD - On dialysis. Ongoing management per nephrology. Update BMP as above.    Informed Consent   Shared Decision Making/Informed Consent The risks [chest pain, shortness of breath, cardiac arrhythmias, dizziness, blood pressure fluctuations, myocardial infarction, stroke/transient ischemic attack, nausea, vomiting, allergic reaction, radiation exposure, metallic taste sensation and life-threatening  complications (estimated to be 1 in 10,000)], benefits (risk stratification, diagnosing coronary artery disease, treatment guidance) and alternatives of a nuclear stress test were discussed in detail with Casey Franco and he agrees to proceed.      Disposition: Obtain 2 week Zio XT and Lexiscan Myoview. F/u with Dr. Gollan or an APP in 6-8 weeks.   Medication Adjustments/Labs and Tests Ordered: Current medicines are reviewed at length with the patient today.  Concerns regarding medicines are outlined above. Medication changes, Labs and Tests ordered today are summarized above and listed in the Patient Instructions accessible in Encounters.   Bonney Lesley Maffucci, PA-C 08/30/2024 10:54 AM     [facility] - [place] [address] [place], [place] [address] [phone]

## 2024-08-30 NOTE — Patient Instructions (Signed)
 Medication Instructions:  Your physician recommends that you continue on your current medications as directed. Please refer to the Current Medication list given to you today.  *If you need a refill on your cardiac medications before your next appointment, please call your pharmacy*  Lab Work: Your provider would like for you to have following labs drawn today CBC,BMET.   If you have labs (blood work) drawn today and your tests are completely normal, you will receive your results only by: MyChart Message (if you have MyChart) OR A paper copy in the mail If you have any lab test that is abnormal or we need to change your treatment, we will call you to review the results.  Testing/Procedures: Your provider has ordered a Lexiscan/ Exercise Myoview Stress test. This will take place at [facility]. Please report to the [facility] medical mall entrance. The volunteers at the first desk will direct you where to go.  [facility] MYOVIEW  Your provider has ordered a Stress Test with nuclear imaging. The purpose of this test is to evaluate the blood supply to your heart muscle. This procedure is referred to as a Non-Invasive Stress Test. This is because other than having an IV started in your vein, nothing is inserted or invades your body. Cardiac stress tests are done to find areas of poor blood flow to the heart by determining the extent of coronary artery disease (CAD). Some patients exercise on a treadmill, which naturally increases the blood flow to your heart, while others who are unable to walk on a treadmill due to physical limitations will have a pharmacologic/chemical stress agent called Lexiscan . This medicine will mimic walking on a treadmill by temporarily increasing your coronary blood flow.   Please note: these test may take anywhere between 2-4 hours to complete  How to prepare for your Myoview test:  Nothing to eat for 6 hours prior to the test No caffeine for 24 hours prior to test No smoking 24 hours  prior to test. Your medication may be taken with water.  If your doctor stopped a medication because of this test, do not take that medication. Ladies, please do not wear dresses.  Skirts or pants are appropriate. Please wear a short sleeve shirt. No perfume, cologne or lotion. Wear comfortable walking shoes. No heels!   PLEASE NOTIFY THE OFFICE AT LEAST 24 HOURS IN ADVANCE IF YOU ARE UNABLE TO KEEP YOUR APPOINTMENT.  [phone] AND  PLEASE NOTIFY NUCLEAR MEDICINE AT [facility] AT LEAST 24 HOURS IN ADVANCE IF YOU ARE UNABLE TO KEEP YOUR APPOINTMENT. [phone]   Follow-Up: At [facility], you and your health needs are our priority.  As part of our continuing mission to provide you with exceptional heart care, our providers are all part of one team.  This team includes your primary Cardiologist (physician) and Advanced Practice Providers or APPs (Physician Assistants and Nurse Practitioners) who all work together to provide you with the care you need, when you need it.  Your next appointment:   6-8 week(s)  Provider:   You will see one of the following Advanced Practice Providers on your designated Care Team:   Lonni Meager, NP Lesley Maffucci, PA-C Bernardino Bring, PA-C Cadence Tecumseh, PA-C Tylene Lunch, NP Barnie Hila, NP      We recommend signing up for the patient portal called MyChart.  Sign up information is provided on this After Visit Summary.  MyChart is used to connect with patients for Virtual Visits (Telemedicine).  Patients are able  to view lab/test results, encounter notes, upcoming appointments, etc.  Non-urgent messages can be sent to your provider as well.   To learn more about what you can do with MyChart, go to [email or web address].   Other Instructions Your physician has recommended that you wear a Zio monitor.   This monitor is a medical device that records the heart's electrical activity. Doctors most often use these monitors to diagnose  arrhythmias. Arrhythmias are problems with the speed or rhythm of the heartbeat. The monitor is a small device applied to your chest. You can wear one while you do your normal daily activities. While wearing this monitor if you have any symptoms to push the button and record what you felt. Once you have worn this monitor for the period of time provider prescribed (Usually 14 days), you will return the monitor device in the postage paid box. Once it is returned they will download the data collected and provide us  with a report which the provider will then review and we will call you with those results. Important tips:  Avoid showering during the first 24 hours of wearing the monitor. Avoid excessive sweating to help maximize wear time. Do not submerge the device, no hot tubs, and no swimming pools. Keep any lotions or oils away from the patch. After 24 hours you may shower with the patch on. Take brief showers with your back facing the shower head.  Do not remove patch once it has been placed because that will interrupt data and decrease adhesive wear time. Push the button when you have any symptoms and write down what you were feeling. Once you have completed wearing your monitor, remove and place into box which has postage paid and place in your outgoing mailbox.  If for some reason you have misplaced your box then call our office and we can provide another box and/or mail it off for you.

## 2024-09-13 ENCOUNTER — Ambulatory Visit

## 2024-09-20 ENCOUNTER — Ambulatory Visit: Admission: RE | Admit: 2024-09-20 | Discharge: 2024-09-20 | Attending: Physician Assistant

## 2024-09-20 ENCOUNTER — Other Ambulatory Visit: Payer: Self-pay | Admitting: Cardiology

## 2024-09-20 DIAGNOSIS — I5020 Unspecified systolic (congestive) heart failure: Secondary | ICD-10-CM

## 2024-09-20 LAB — NM MYOCAR MULTI W/SPECT W/WALL MOTION / EF
LV dias vol: 189 mL (ref 62–150)
LV sys vol: 92 mL (ref 4.2–5.8)
MPHR: 156 {beats}/min
Nuc Stress EF: 51 %
Peak HR: 75 {beats}/min
Percent HR: 48 %
Rest HR: 68 {beats}/min
Rest Nuclear Isotope Dose: 10 mCi
SDS: 9
SRS: 8
SSS: 10
Stress Nuclear Isotope Dose: 31.6 mCi
TID: 1.07

## 2024-09-20 MED ORDER — TECHNETIUM TC 99M TETROFOSMIN IV KIT
9.9700 | PACK | Freq: Once | INTRAVENOUS | Status: AC | PRN
  Administered 2024-09-20: 9.97 via INTRAVENOUS

## 2024-09-20 MED ORDER — REGADENOSON 0.4 MG/5ML IV SOLN
0.4000 mg | Freq: Once | INTRAVENOUS | Status: AC
  Administered 2024-09-20: 0.4 mg via INTRAVENOUS

## 2024-09-20 MED ORDER — TECHNETIUM TC 99M TETROFOSMIN IV KIT
31.6300 | PACK | Freq: Once | INTRAVENOUS | Status: AC | PRN
  Administered 2024-09-20: 31.63 via INTRAVENOUS

## 2024-09-20 NOTE — Progress Notes (Unsigned)
     Bryton C Vallance presented for a Lexiscan  nuclear stress test today.  I Pearlie Lafosse, NP, provided direct supervision and was present during the stress portion of the study today, which was completed without significant symptoms, immediate complications, or acute ST/T changes on ECG.  Stress imaging is pending at this time.  Preliminary ECG findings may be listed in the chart, but the stress test result will not be finalized until perfusion imaging is complete.  Kamdyn Covel, NP  09/20/2024, 9:21 AM

## 2024-09-27 ENCOUNTER — Encounter: Payer: Self-pay | Admitting: Cardiology

## 2024-09-27 ENCOUNTER — Ambulatory Visit: Attending: Cardiology | Admitting: Cardiology

## 2024-09-27 VITALS — BP 148/98 | HR 62 | Ht 72.0 in | Wt 143.0 lb

## 2024-09-27 DIAGNOSIS — D638 Anemia in other chronic diseases classified elsewhere: Secondary | ICD-10-CM

## 2024-09-27 DIAGNOSIS — I5020 Unspecified systolic (congestive) heart failure: Secondary | ICD-10-CM

## 2024-09-27 DIAGNOSIS — R9439 Abnormal result of other cardiovascular function study: Secondary | ICD-10-CM

## 2024-09-27 DIAGNOSIS — I10 Essential (primary) hypertension: Secondary | ICD-10-CM

## 2024-09-27 DIAGNOSIS — N186 End stage renal disease: Secondary | ICD-10-CM

## 2024-09-27 DIAGNOSIS — I480 Paroxysmal atrial fibrillation: Secondary | ICD-10-CM

## 2024-09-27 NOTE — H&P (View-Only) (Signed)
 Cardiology Office Note   Date:  09/27/2024  ID:  Casey Franco, DOB 01-31-1961, MRN [id number] PCP: [facility]  [facility] Providers Cardiologist:  Evalene Lunger, MD Cardiology APP:  Lorene Lesley CROME, PA-C     History of Present Illness Casey Franco is a 63 y.o. 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   Previously hospitalized 2/13 - 12/31/2023 originally presented with 3 weeks of abdominal pain.  He was found to have spontaneous bacterial peritonitis completing 2 weeks of antibiotics.  Troponin was mildly elevated with downtrending felt likely due to demand ischemia.  During admission he had an episode of atrial fibrillation and was started on low-dose metoprolol .  Given high risk of bleeding due to liver disease and kidney disease he was started on subtherapeutic dose of apixaban  2.5 mg daily.  Echocardiogram revealed an LVEF of 40 to 45%, moderate concentric LVH and mildly reduced RV systolic function, moderately enlarged RV size, moderately increased LV wall thickness, small pericardial effusion, moderate MR.  He was seen by Dr. Gollan 06/07/2024 reporting feeling fairly well with some orthopnea.  He had issues with bleeding from his right catheter access site for several weeks and stopped apixaban  2 weeks prior to that visit.  Bleeding has since resolved but he did not resume the apixaban .  He was noted to be in atrial flutter that was rate controlled.  It was recommended he resume apixaban  2.5 mg twice daily.  Echocardiogram 08/01/2024 showed an EF of 35 to 40% with global hypokinesis, mild LVH, severely reduced RV systolic function, severely enlarged RV size, moderately dilated LA, moderate to severe MR.  He was called and given these  findings he was recommended to increase his apixaban  to 5 mg twice daily in an effort to pursue cardioversion after 3 weeks.  He was last seen in clinic 08/30/2024 where he been doing reasonably well from a cardiac perspective.  He had reported low energy level symptoms determined to start walking for exercise.  He reported dyspnea while walking long distances but notices it more less when he walks he gets short of breath.  He has occasional orthopnea and PND.  He had been compliant with apixaban  and denied issues with bleeding.  It was recommended that he wear a ZIO XT monitor for 2 weeks to assess his A-fib burden to further guide management.  He was sent for updated CBC and BMP.  He was also scheduled for Lexiscan  Myoview  to assess for ischemic causes of cardiomyopathy.   He returns to clinic today stating overall he has been doing fairly well from a cardiac perspective.  He denies any chest pain.  Stated that he was having shortness of breath or PND at night when he was trying to sleep.  He states that overall he feels as though the symptoms have been stable.  Unfortunately today he has not had any of his medication prior to his appointment for his blood pressure.  There are also questions related to the actual dosing of his apixaban  that he has taken previously the prescription filled was for 2.5 mg twice daily that was increased to 5 mg twice daily and he is unsure whether he is taking 2.5's or fives at this time.  He states that he has not missed any doses of  his blood thinner and denies any bleeding with no blood noted in his urine or stool.  States that he continues to undergo hemodialysis on Monday Wednesday and Friday for fluid removal.  Has not had to have any recurrent paracentesis completed due to extra fluid.  Denies any recent hospitalizations or visits to the emergency department.  ROS: 10 point review of system has been reviewed and considered negative the exception was listed in the  HPI  Studies Reviewed EKG Interpretation Date/Time:  Tuesday September 27 2024 08:32:11 EST Ventricular Rate:  77 PR Interval:  226 QRS Duration:  112 QT Interval:  452 QTC Calculation: 511 R Axis:   36  Text Interpretation: Sinus rhythm with 1st degree A-V block Possible Left atrial enlargement Left ventricular hypertrophy with repolarization abnormality ( Cornell product ) Inferior infarct , age undetermined Prolonged QT When compared with ECG of 30-Aug-2024 09:46, Confirmed by Gerard Frederick ([id number]) on 09/27/2024 9:05:35 AM    Lexiscan  MPI 09/20/2024   Abnormal pharmacologic myocardial perfusion stress test.   There is a moderate in size, severe, partially reversible defect involving the anterior wall, apex, and apical inferior segment consistent with scar and peri-infarct ischemia.   Left ventricular systolic function is low normal to mildly reduced (LVEF 51%) with anterior and apical hypokinesis.   Extensive coronary artery calcification is evident on the attenuation correction CT.   Moderate right pleural effusion is present with right lower lobe atelectasis versus consolidation.  Effusion appears larger in size compared to prior CT of the abdomen and pelvis from 01/21/2024.  Trivial left pleural effusion and abdominal ascites are also noted.   This is an intermediate risk study.  08/11/2024 Echo complete 1. Left ventricular ejection fraction, by estimation, is 35 to 40%. The  left ventricle has moderately decreased function. The left ventricle  demonstrates global hypokinesis. There is mild left ventricular  hypertrophy. Left ventricular diastolic  parameters are indeterminate. There is the interventricular septum is  flattened in systole, consistent with right ventricular pressure overload.  The average left ventricular global longitudinal strain is -7.3 %. The  global longitudinal strain is  abnormal.   2. Right ventricular systolic function is severely reduced. The right   ventricular size is severely enlarged. There is normal pulmonary artery  systolic pressure. The estimated right ventricular systolic pressure is  30.2 mmHg.   3. Left atrial size was moderately dilated.   4. Right atrial size was severely dilated.   5. The mitral valve is normal in structure. Moderate to severe mitral  valve regurgitation. No evidence of mitral stenosis. Moderate to severe  mitral annular calcification.   6. Tricuspid valve regurgitation is mild to moderate.   7. The aortic valve is calcified. Aortic valve regurgitation is not  visualized. Aortic valve sclerosis/calcification is present, without any  evidence of aortic stenosis. Aortic valve mean gradient measures 3.0 mmHg.   8. The inferior vena cava is dilated in size with <50% respiratory  variability, suggesting right atrial pressure of 15 mmHg.    11/2023 Echo complete 1. Left ventricular ejection fraction, by estimation, is 40 to 45%. The  left ventricle has mildly decreased function. The left ventricle  demonstrates regional wall motion abnormalities (see scoring  diagram/findings for description). The left ventricular   internal cavity size was mildly to moderately dilated. There is moderate  concentric left ventricular hypertrophy. Indeterminate diastolic filling  due to E-A fusion. The global longitudinal strain is abnormal.   2. T.   3. Right ventricular  systolic function is mildly reduced. The right  ventricular size is moderately enlarged. Moderately increased right  ventricular wall thickness.   4. Right atrial size was moderately dilated.   5. A small pericardial effusion is present. The pericardial effusion is  circumferential.   6. The mitral valve is grossly normal. Moderate mitral valve  regurgitation.   7. The aortic valve is calcified. Aortic valve regurgitation is not  visualized. Aortic valve sclerosis/calcification is present, without any  evidence of aortic stenosis.  Risk  Assessment/Calculations  CHA2DS2-VASc Score = 3   This indicates a 3.2% annual risk of stroke. The patient's score is based upon: CHF History: 1 HTN History: 1 Diabetes History: 1 Stroke History: 0 Vascular Disease History: 0 Age Score: 0 Gender Score: 0    HYPERTENSION CONTROL Vitals:   09/27/24 0807 09/27/24 0809  BP: (!) 138/90 (!) 148/98    The patient's blood pressure is elevated above target today.  In order to address the patient's elevated BP: Blood pressure will be monitored at home to determine if medication changes need to be made. (no meds taken this morning)          Physical Exam VS:  BP (!) 148/98 (Cuff Size: Normal)   Pulse 62   Ht 6' (1.829 m)   Wt 143 lb (64.9 kg)   SpO2 98%   BMI 19.39 kg/m        Wt Readings from Last 3 Encounters:  09/27/24 143 lb (64.9 kg)  08/30/24 148 lb 3.2 oz (67.2 kg)  06/07/24 146 lb 6 oz (66.4 kg)    GEN: Well nourished, well developed in no acute distress NECK: No JVD; No carotid bruits CARDIAC: RRR, no murmurs, rubs, gallops, right upper chest permacath RESPIRATORY:  Clear to auscultation without rales, wheezing or rhonchi  ABDOMEN: Soft, non-tender, non-distended EXTREMITIES:  No edema; No deformity   ASSESSMENT AND PLAN Abnormal Lexiscan  Myoview  with severe MR and severely reduced RV function and suspected cardiomyopathy during hospitalization. he was scheduled for Lexiscan .  Unfortunately was abnormal following oncological myocardial perfusion stress testing, moderate in size severe, partially reversible defect involving the anterior wall, apex, and apical inferior segment consistent with scar peri-infarct ischemia, extensive coronary artery calcification evident on the attenuation correction CT, moderate right pleural effusion was present trivial left pleural effusion, it was an indeterminate risk study.  With this abnormal testing and shortness of breath/PND the EKG today is sinus rhythm with first degree AV block  with a rate of 77 with left atrial enlargement and likely inferior infarct he has been scheduled for left heart catheterization.  He is being sent for labs today, will have to hold his apixaban  a day prior to his procedure, and is advised to proceed with dialysis the day after.  Paroxysmal atrial fibrillation/flutter noted during admission in 12/2023 during which she was started on apixaban  at 2.5 mg twice daily due to risk of bleeding and anemia of chronic disease.  Apixaban  was increased to therapeutic dosing on 11/4 for CHA2DS2-VASc score of at least 4 for stroke prophylaxis to 5 mg twice daily.  He was recommended to wear a 2-week ZIO monitor to assess A-fib burden to further guide management.  Unfortunately the results of the monitor are not back at the time of his appointment.  He denies any bleeding with no blood noted in his urine or stool.  He is also continued on metoprolol  tartrate 25 mg twice daily.  HFrEF with echocardiogram during admission showed an LVEF  of 40 to 45%, repeat echocardiogram 07/2024 showed an EF of 35-40% with moderate to severe MR and severely reduced RV function.  Suspect cardiomyopathy in the setting of atrial fibrillation atrial flutter.  Unfortunately today he has in sinus rhythm.  He appears to be euvolemic on exam.  Volume is managed with dialysis.  Lexiscan  was abnormal he has been scheduled for left heart catheterization.  He is continued on irbesartan  300 mg daily metoprolol  to tartrate 25 mg twice daily with consideration of consolidation after his procedure.  No SGLT2 inhibitor or MRA therapy due to end-stage renal disease.  Hypertension with a blood pressure today 148/98 and 138/90.  Blood pressure is elevated today as he has yet to have any of his blood pressure medications prior to his appointment.  He has been continued on irbesartan  and metoprolol .  He has also been advised to monitor his pressure 1 to 2 hours postmedication administration at home as well.  ESRD  with fluid management per dialysis on Monday Wednesdays and Fridays.  He currently has a left upper chest permacath.  Ongoing management per nephrology.  Anemia of chronic disease where he has been sent for an updated CBC today.  Last hemoglobin in 08/30/2024 was 8.8.  Remains stable.     Informed Consent   Shared Decision Making/Informed Consent The risks [stroke (1 in 1000), death (1 in 1000), kidney failure [usually temporary] (1 in 500), bleeding (1 in 200), allergic reaction [possibly serious] (1 in 200)], benefits (diagnostic support and management of coronary artery disease) and alternatives of a cardiac catheterization were discussed in detail with Mr. Gueye and he is willing to proceed.     Dispo: Patient to return to clinic with MD/APP in 2 to 3 weeks postprocedure or sooner if needed for further evaluation  Signed, Nirvan Laban, NP

## 2024-09-27 NOTE — Patient Instructions (Signed)
 Medication Instructions:  Your physician recommends that you continue on your current medications as directed. Please refer to the Current Medication list given to you today.   *If you need a refill on your cardiac medications before your next appointment, please call your pharmacy*  Lab Work: Your provider would like for you to have following labs drawn today BMP, CBC.   If you have labs (blood work) drawn today and your tests are completely normal, you will receive your results only by: MyChart Message (if you have MyChart) OR A paper copy in the mail If you have any lab test that is abnormal or we need to change your treatment, we will call you to review the results.  Testing/Procedures:  [facility] [place] [place] [address], [address] [place] [address] Dept: [phone] Loc: [phone]  Casey Franco  09/27/2024  You are scheduled for a Cardiac Catheterization on Tuesday, December 30 with Dr. Lonni End.  1. Please arrive at the [facility] Entrance of [facility], [address], [place] [address] at 6:30 AM (This is 1 hour(s) prior to your procedure time).  Proceed to the Check-In Desk directly inside the entrance.  Procedure Parking: Use the entrance off of the [address] side of the hospital. Turn right upon entering and follow the driveway to parking that is directly in front of the [facility] Center. There is no valet parking available at this entrance, however there is an awning directly in front of the [facility] Vascular Center for drop off/ pick up for patients.  Special note: Every effort is made to have your procedure done on time. Please understand that emergencies sometimes delay scheduled procedures.  2. Diet: Nothing to eat after midnight.   3. Hydration: On December 30, you may drink approved liquids (see below) until 2 hours before the  procedure time.       List of approved liquids water, clear juice, clear tea, black coffee, fruit juices, non-citric and without pulp, carbonated beverages, Gatorade, Kool -Aid, plain Jello-O and plain ice popsicles.  4. Labs: You will need to have blood drawn on Tuesday, December 16 at [facility], Go to 1st desk on your right to register.  Address: [address] [place], [place] [address]  Open: 8am - 5pm  Phone: [phone]. You do not need to be fasting.  5. Medication instructions in preparation for your procedure:   Contrast Allergy: No  Stop taking Eliquis  (Apixiban) on Saturday, December 27.  Last dose October 07, 2024  On the morning of your procedure, take your morning medicines NOT listed above.  You may use sips of water.  6. Plan to go home the same day, you will only stay overnight if medically necessary. 7. Bring a current list of your medications and current insurance cards. 8. You MUST have a responsible person to drive you home. 9. Someone MUST be with you the first 24 hours after you arrive home or your discharge will be delayed. 10. Please wear clothes that are easy to get on and off and wear slip-on shoes.  Thank you for allowing us  to care for you!   -- [facility]   Follow-Up: At [facility], you and your health needs are our priority.  As part of our continuing mission to provide you with exceptional heart care, our providers are all part  of one team.  This team includes your primary Cardiologist (physician) and Advanced Practice Providers or APPs (Physician Assistants and Nurse Practitioners) who all work together to provide you with the care you need, when you need it.  Your next appointment:   2 - 3 week(s)  Provider:   You may see Timothy Gollan, MD or one of the following Advanced Practice Providers on your designated Care Team:   Tylene Lunch, NP  We recommend signing up for the patient  portal called MyChart.  Sign up information is provided on this After Visit Summary.  MyChart is used to connect with patients for Virtual Visits (Telemedicine).  Patients are able to view lab/test results, encounter notes, upcoming appointments, etc.  Non-urgent messages can be sent to your provider as well.   To learn more about what you can do with MyChart, go to [email or web address].

## 2024-09-27 NOTE — Progress Notes (Signed)
 Cardiology Office Note   Date:  09/27/2024  ID:  [name], DOB 01-31-1961, MRN [id number] PCP: [facility]  [facility] Providers Cardiologist:  Evalene Lunger, MD Cardiology APP:  Lorene Lesley CROME, PA-C     History of Present Illness Casey Franco is a 63 y.o. 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   Previously hospitalized 2/13 - 12/31/2023 originally presented with 3 weeks of abdominal pain.  He was found to have spontaneous bacterial peritonitis completing 2 weeks of antibiotics.  Troponin was mildly elevated with downtrending felt likely due to demand ischemia.  During admission he had an episode of atrial fibrillation and was started on low-dose metoprolol .  Given high risk of bleeding due to liver disease and kidney disease he was started on subtherapeutic dose of apixaban  2.5 mg daily.  Echocardiogram revealed an LVEF of 40 to 45%, moderate concentric LVH and mildly reduced RV systolic function, moderately enlarged RV size, moderately increased LV wall thickness, small pericardial effusion, moderate MR.  He was seen by Dr. Gollan 06/07/2024 reporting feeling fairly well with some orthopnea.  He had issues with bleeding from his right catheter access site for several weeks and stopped apixaban  2 weeks prior to that visit.  Bleeding has since resolved but he did not resume the apixaban .  He was noted to be in atrial flutter that was rate controlled.  It was recommended he resume apixaban  2.5 mg twice daily.  Echocardiogram 08/01/2024 showed an EF of 35 to 40% with global hypokinesis, mild LVH, severely reduced RV systolic function, severely enlarged RV size, moderately dilated LA, moderate to severe MR.  He was called and given these  findings he was recommended to increase his apixaban  to 5 mg twice daily in an effort to pursue cardioversion after 3 weeks.  He was last seen in clinic 08/30/2024 where he been doing reasonably well from a cardiac perspective.  He had reported low energy level symptoms determined to start walking for exercise.  He reported dyspnea while walking long distances but notices it more less when he walks he gets short of breath.  He has occasional orthopnea and PND.  He had been compliant with apixaban  and denied issues with bleeding.  It was recommended that he wear a ZIO XT monitor for 2 weeks to assess his A-fib burden to further guide management.  He was sent for updated CBC and BMP.  He was also scheduled for Lexiscan  Myoview  to assess for ischemic causes of cardiomyopathy.   He returns to clinic today stating overall he has been doing fairly well from a cardiac perspective.  He denies any chest pain.  Stated that he was having shortness of breath or PND at night when he was trying to sleep.  He states that overall he feels as though the symptoms have been stable.  Unfortunately today he has not had any of his medication prior to his appointment for his blood pressure.  There are also questions related to the actual dosing of his apixaban  that he has taken previously the prescription filled was for 2.5 mg twice daily that was increased to 5 mg twice daily and he is unsure whether he is taking 2.5's or fives at this time.  He states that he has not missed any doses of  his blood thinner and denies any bleeding with no blood noted in his urine or stool.  States that he continues to undergo hemodialysis on Monday Wednesday and Friday for fluid removal.  Has not had to have any recurrent paracentesis completed due to extra fluid.  Denies any recent hospitalizations or visits to the emergency department.  ROS: 10 point review of system has been reviewed and considered negative the exception was listed in the  HPI  Studies Reviewed EKG Interpretation Date/Time:  Tuesday September 27 2024 08:32:11 EST Ventricular Rate:  77 PR Interval:  226 QRS Duration:  112 QT Interval:  452 QTC Calculation: 511 R Axis:   36  Text Interpretation: Sinus rhythm with 1st degree A-V block Possible Left atrial enlargement Left ventricular hypertrophy with repolarization abnormality ( Cornell product ) Inferior infarct , age undetermined Prolonged QT When compared with ECG of 30-Aug-2024 09:46, Confirmed by Gerard Frederick ([id number]) on 09/27/2024 9:05:35 AM    Lexiscan  MPI 09/20/2024   Abnormal pharmacologic myocardial perfusion stress test.   There is a moderate in size, severe, partially reversible defect involving the anterior wall, apex, and apical inferior segment consistent with scar and peri-infarct ischemia.   Left ventricular systolic function is low normal to mildly reduced (LVEF 51%) with anterior and apical hypokinesis.   Extensive coronary artery calcification is evident on the attenuation correction CT.   Moderate right pleural effusion is present with right lower lobe atelectasis versus consolidation.  Effusion appears larger in size compared to prior CT of the abdomen and pelvis from 01/21/2024.  Trivial left pleural effusion and abdominal ascites are also noted.   This is an intermediate risk study.  08/11/2024 Echo complete 1. Left ventricular ejection fraction, by estimation, is 35 to 40%. The  left ventricle has moderately decreased function. The left ventricle  demonstrates global hypokinesis. There is mild left ventricular  hypertrophy. Left ventricular diastolic  parameters are indeterminate. There is the interventricular septum is  flattened in systole, consistent with right ventricular pressure overload.  The average left ventricular global longitudinal strain is -7.3 %. The  global longitudinal strain is  abnormal.   2. Right ventricular systolic function is severely reduced. The right   ventricular size is severely enlarged. There is normal pulmonary artery  systolic pressure. The estimated right ventricular systolic pressure is  30.2 mmHg.   3. Left atrial size was moderately dilated.   4. Right atrial size was severely dilated.   5. The mitral valve is normal in structure. Moderate to severe mitral  valve regurgitation. No evidence of mitral stenosis. Moderate to severe  mitral annular calcification.   6. Tricuspid valve regurgitation is mild to moderate.   7. The aortic valve is calcified. Aortic valve regurgitation is not  visualized. Aortic valve sclerosis/calcification is present, without any  evidence of aortic stenosis. Aortic valve mean gradient measures 3.0 mmHg.   8. The inferior vena cava is dilated in size with <50% respiratory  variability, suggesting right atrial pressure of 15 mmHg.    11/2023 Echo complete 1. Left ventricular ejection fraction, by estimation, is 40 to 45%. The  left ventricle has mildly decreased function. The left ventricle  demonstrates regional wall motion abnormalities (see scoring  diagram/findings for description). The left ventricular   internal cavity size was mildly to moderately dilated. There is moderate  concentric left ventricular hypertrophy. Indeterminate diastolic filling  due to E-A fusion. The global longitudinal strain is abnormal.   2. T.   3. Right ventricular  systolic function is mildly reduced. The right  ventricular size is moderately enlarged. Moderately increased right  ventricular wall thickness.   4. Right atrial size was moderately dilated.   5. A small pericardial effusion is present. The pericardial effusion is  circumferential.   6. The mitral valve is grossly normal. Moderate mitral valve  regurgitation.   7. The aortic valve is calcified. Aortic valve regurgitation is not  visualized. Aortic valve sclerosis/calcification is present, without any  evidence of aortic stenosis.  Risk  Assessment/Calculations  CHA2DS2-VASc Score = 3   This indicates a 3.2% annual risk of stroke. The patient's score is based upon: CHF History: 1 HTN History: 1 Diabetes History: 1 Stroke History: 0 Vascular Disease History: 0 Age Score: 0 Gender Score: 0    HYPERTENSION CONTROL Vitals:   09/27/24 0807 09/27/24 0809  BP: (!) 138/90 (!) 148/98    The patient's blood pressure is elevated above target today.  In order to address the patient's elevated BP: Blood pressure will be monitored at home to determine if medication changes need to be made. (no meds taken this morning)          Physical Exam VS:  BP (!) 148/98 (Cuff Size: Normal)   Pulse 62   Ht 6' (1.829 m)   Wt 143 lb (64.9 kg)   SpO2 98%   BMI 19.39 kg/m        Wt Readings from Last 3 Encounters:  09/27/24 143 lb (64.9 kg)  08/30/24 148 lb 3.2 oz (67.2 kg)  06/07/24 146 lb 6 oz (66.4 kg)    GEN: Well nourished, well developed in no acute distress NECK: No JVD; No carotid bruits CARDIAC: RRR, no murmurs, rubs, gallops, right upper chest permacath RESPIRATORY:  Clear to auscultation without rales, wheezing or rhonchi  ABDOMEN: Soft, non-tender, non-distended EXTREMITIES:  No edema; No deformity   ASSESSMENT AND PLAN Abnormal Lexiscan  Myoview  with severe MR and severely reduced RV function and suspected cardiomyopathy during hospitalization. he was scheduled for Lexiscan .  Unfortunately was abnormal following oncological myocardial perfusion stress testing, moderate in size severe, partially reversible defect involving the anterior wall, apex, and apical inferior segment consistent with scar peri-infarct ischemia, extensive coronary artery calcification evident on the attenuation correction CT, moderate right pleural effusion was present trivial left pleural effusion, it was an indeterminate risk study.  With this abnormal testing and shortness of breath/PND the EKG today is sinus rhythm with first degree AV block  with a rate of 77 with left atrial enlargement and likely inferior infarct he has been scheduled for left heart catheterization.  He is being sent for labs today, will have to hold his apixaban  a day prior to his procedure, and is advised to proceed with dialysis the day after.  Paroxysmal atrial fibrillation/flutter noted during admission in 12/2023 during which she was started on apixaban  at 2.5 mg twice daily due to risk of bleeding and anemia of chronic disease.  Apixaban  was increased to therapeutic dosing on 11/4 for CHA2DS2-VASc score of at least 4 for stroke prophylaxis to 5 mg twice daily.  He was recommended to wear a 2-week ZIO monitor to assess A-fib burden to further guide management.  Unfortunately the results of the monitor are not back at the time of his appointment.  He denies any bleeding with no blood noted in his urine or stool.  He is also continued on metoprolol  tartrate 25 mg twice daily.  HFrEF with echocardiogram during admission showed an LVEF  of 40 to 45%, repeat echocardiogram 07/2024 showed an EF of 35-40% with moderate to severe MR and severely reduced RV function.  Suspect cardiomyopathy in the setting of atrial fibrillation atrial flutter.  Unfortunately today he has in sinus rhythm.  He appears to be euvolemic on exam.  Volume is managed with dialysis.  Lexiscan  was abnormal he has been scheduled for left heart catheterization.  He is continued on irbesartan  300 mg daily metoprolol  to tartrate 25 mg twice daily with consideration of consolidation after his procedure.  No SGLT2 inhibitor or MRA therapy due to end-stage renal disease.  Hypertension with a blood pressure today 148/98 and 138/90.  Blood pressure is elevated today as he has yet to have any of his blood pressure medications prior to his appointment.  He has been continued on irbesartan  and metoprolol .  He has also been advised to monitor his pressure 1 to 2 hours postmedication administration at home as well.  ESRD  with fluid management per dialysis on Monday Wednesdays and Fridays.  He currently has a left upper chest permacath.  Ongoing management per nephrology.  Anemia of chronic disease where he has been sent for an updated CBC today.  Last hemoglobin in 08/30/2024 was 8.8.  Remains stable.     Informed Consent   Shared Decision Making/Informed Consent The risks [stroke (1 in 1000), death (1 in 1000), kidney failure [usually temporary] (1 in 500), bleeding (1 in 200), allergic reaction [possibly serious] (1 in 200)], benefits (diagnostic support and management of coronary artery disease) and alternatives of a cardiac catheterization were discussed in detail with Mr. Gueye and he is willing to proceed.     Dispo: Patient to return to clinic with MD/APP in 2 to 3 weeks postprocedure or sooner if needed for further evaluation  Signed, Nirvan Laban, NP

## 2024-09-28 ENCOUNTER — Ambulatory Visit: Payer: Self-pay | Admitting: Cardiology

## 2024-09-28 LAB — BASIC METABOLIC PANEL WITH GFR
BUN/Creatinine Ratio: 8 — ABNORMAL LOW (ref 10–24)
BUN: 39 mg/dL — ABNORMAL HIGH (ref 8–27)
CO2: 21 mmol/L (ref 20–29)
Calcium: 9 mg/dL (ref 8.6–10.2)
Chloride: 102 mmol/L (ref 96–106)
Creatinine, Ser: 4.85 mg/dL — ABNORMAL HIGH (ref 0.76–1.27)
Glucose: 77 mg/dL (ref 70–99)
Potassium: 3.4 mmol/L — ABNORMAL LOW (ref 3.5–5.2)
Sodium: 143 mmol/L (ref 134–144)
eGFR: 13 mL/min/1.73 — ABNORMAL LOW (ref >59)

## 2024-09-28 LAB — CBC
Hematocrit: 28.3 % — ABNORMAL LOW (ref 37.5–51.0)
Hemoglobin: 8.9 g/dL — ABNORMAL LOW (ref 13.0–17.7)
MCH: 28.7 pg (ref 26.6–33.0)
MCHC: 31.4 g/dL — ABNORMAL LOW (ref 31.5–35.7)
MCV: 91 fL (ref 79–97)
Platelets: 154 x10E3/uL (ref 150–450)
RBC: 3.1 x10E6/uL — ABNORMAL LOW (ref 4.14–5.80)
RDW: 15.4 % (ref 11.6–15.4)
WBC: 5.7 x10E3/uL (ref 3.4–10.8)

## 2024-09-28 NOTE — Progress Notes (Signed)
 Pre procedure labs. Blood counts are stable. Potassium slightly low. He will have an additional lab draw done at dialysis prior to procedure. We will need those results as well.

## 2024-10-09 DIAGNOSIS — I480 Paroxysmal atrial fibrillation: Secondary | ICD-10-CM | POA: Diagnosis not present

## 2024-10-09 DIAGNOSIS — I10 Essential (primary) hypertension: Secondary | ICD-10-CM | POA: Diagnosis not present

## 2024-10-11 ENCOUNTER — Inpatient Hospital Stay

## 2024-10-11 ENCOUNTER — Encounter
Admission: AD | Disposition: A | Discharge status: Other Acute Inpatient Hospital | Payer: Self-pay | Source: Home / Self Care | Attending: Student in an Organized Health Care Education/Training Program

## 2024-10-11 ENCOUNTER — Inpatient Hospital Stay
Admission: AD | Admit: 2024-10-11 | Discharge: 2024-10-13 | DRG: 286 | Disposition: A | Discharge status: Other Acute Inpatient Hospital | Attending: Student in an Organized Health Care Education/Training Program | Admitting: Student in an Organized Health Care Education/Training Program

## 2024-10-11 ENCOUNTER — Encounter: Payer: Self-pay | Admitting: Internal Medicine

## 2024-10-11 ENCOUNTER — Other Ambulatory Visit: Payer: Self-pay

## 2024-10-11 DIAGNOSIS — N189 Chronic kidney disease, unspecified: Secondary | ICD-10-CM | POA: Diagnosis not present

## 2024-10-11 DIAGNOSIS — I440 Atrioventricular block, first degree: Secondary | ICD-10-CM | POA: Diagnosis present

## 2024-10-11 DIAGNOSIS — Z681 Body mass index (BMI) 19 or less, adult: Secondary | ICD-10-CM

## 2024-10-11 DIAGNOSIS — E440 Moderate protein-calorie malnutrition: Secondary | ICD-10-CM | POA: Diagnosis present

## 2024-10-11 DIAGNOSIS — I2510 Atherosclerotic heart disease of native coronary artery without angina pectoris: Secondary | ICD-10-CM | POA: Diagnosis present

## 2024-10-11 DIAGNOSIS — I132 Hypertensive heart and chronic kidney disease with heart failure and with stage 5 chronic kidney disease, or end stage renal disease: Principal | ICD-10-CM | POA: Diagnosis present

## 2024-10-11 DIAGNOSIS — I10 Essential (primary) hypertension: Secondary | ICD-10-CM | POA: Diagnosis not present

## 2024-10-11 DIAGNOSIS — D631 Anemia in chronic kidney disease: Secondary | ICD-10-CM | POA: Diagnosis present

## 2024-10-11 DIAGNOSIS — I480 Paroxysmal atrial fibrillation: Secondary | ICD-10-CM

## 2024-10-11 DIAGNOSIS — Z992 Dependence on renal dialysis: Secondary | ICD-10-CM

## 2024-10-11 DIAGNOSIS — I5023 Acute on chronic systolic (congestive) heart failure: Principal | ICD-10-CM | POA: Diagnosis present

## 2024-10-11 DIAGNOSIS — I5082 Biventricular heart failure: Secondary | ICD-10-CM | POA: Diagnosis present

## 2024-10-11 DIAGNOSIS — I4891 Unspecified atrial fibrillation: Secondary | ICD-10-CM | POA: Diagnosis not present

## 2024-10-11 DIAGNOSIS — E785 Hyperlipidemia, unspecified: Secondary | ICD-10-CM | POA: Diagnosis present

## 2024-10-11 DIAGNOSIS — Z79899 Other long term (current) drug therapy: Secondary | ICD-10-CM

## 2024-10-11 DIAGNOSIS — B1920 Unspecified viral hepatitis C without hepatic coma: Secondary | ICD-10-CM | POA: Diagnosis not present

## 2024-10-11 DIAGNOSIS — N2581 Secondary hyperparathyroidism of renal origin: Secondary | ICD-10-CM | POA: Diagnosis present

## 2024-10-11 DIAGNOSIS — Z833 Family history of diabetes mellitus: Secondary | ICD-10-CM | POA: Diagnosis not present

## 2024-10-11 DIAGNOSIS — Z7901 Long term (current) use of anticoagulants: Secondary | ICD-10-CM

## 2024-10-11 DIAGNOSIS — Z87891 Personal history of nicotine dependence: Secondary | ICD-10-CM | POA: Diagnosis not present

## 2024-10-11 DIAGNOSIS — D62 Acute posthemorrhagic anemia: Secondary | ICD-10-CM | POA: Diagnosis not present

## 2024-10-11 DIAGNOSIS — D638 Anemia in other chronic diseases classified elsewhere: Secondary | ICD-10-CM | POA: Diagnosis not present

## 2024-10-11 DIAGNOSIS — K920 Hematemesis: Secondary | ICD-10-CM | POA: Diagnosis not present

## 2024-10-11 DIAGNOSIS — K219 Gastro-esophageal reflux disease without esophagitis: Secondary | ICD-10-CM | POA: Diagnosis not present

## 2024-10-11 DIAGNOSIS — I4892 Unspecified atrial flutter: Secondary | ICD-10-CM | POA: Diagnosis present

## 2024-10-11 DIAGNOSIS — I509 Heart failure, unspecified: Secondary | ICD-10-CM | POA: Diagnosis not present

## 2024-10-11 DIAGNOSIS — R188 Other ascites: Secondary | ICD-10-CM | POA: Diagnosis not present

## 2024-10-11 DIAGNOSIS — K7460 Unspecified cirrhosis of liver: Secondary | ICD-10-CM | POA: Diagnosis present

## 2024-10-11 DIAGNOSIS — N186 End stage renal disease: Secondary | ICD-10-CM | POA: Diagnosis present

## 2024-10-11 DIAGNOSIS — I5021 Acute systolic (congestive) heart failure: Secondary | ICD-10-CM | POA: Diagnosis not present

## 2024-10-11 DIAGNOSIS — K921 Melena: Secondary | ICD-10-CM | POA: Diagnosis not present

## 2024-10-11 DIAGNOSIS — E1122 Type 2 diabetes mellitus with diabetic chronic kidney disease: Secondary | ICD-10-CM | POA: Diagnosis present

## 2024-10-11 DIAGNOSIS — R9439 Abnormal result of other cardiovascular function study: Secondary | ICD-10-CM

## 2024-10-11 DIAGNOSIS — I4819 Other persistent atrial fibrillation: Secondary | ICD-10-CM | POA: Diagnosis present

## 2024-10-11 DIAGNOSIS — I5022 Chronic systolic (congestive) heart failure: Secondary | ICD-10-CM | POA: Diagnosis present

## 2024-10-11 DIAGNOSIS — E1129 Type 2 diabetes mellitus with other diabetic kidney complication: Secondary | ICD-10-CM | POA: Diagnosis not present

## 2024-10-11 DIAGNOSIS — Z862 Personal history of diseases of the blood and blood-forming organs and certain disorders involving the immune mechanism: Secondary | ICD-10-CM | POA: Diagnosis not present

## 2024-10-11 DIAGNOSIS — K7469 Other cirrhosis of liver: Secondary | ICD-10-CM | POA: Diagnosis not present

## 2024-10-11 DIAGNOSIS — R933 Abnormal findings on diagnostic imaging of other parts of digestive tract: Secondary | ICD-10-CM | POA: Diagnosis not present

## 2024-10-11 HISTORY — PX: RIGHT/LEFT HEART CATH AND CORONARY ANGIOGRAPHY: CATH118266

## 2024-10-11 LAB — POCT I-STAT EG7
Acid-Base Excess: 2 mmol/L (ref 0.0–2.0)
Bicarbonate: 27.4 mmol/L (ref 20.0–28.0)
Calcium, Ion: 1.18 mmol/L (ref 1.15–1.40)
HCT: 26 % — ABNORMAL LOW (ref 39.0–52.0)
Hemoglobin: 8.8 g/dL — ABNORMAL LOW (ref 13.0–17.0)
O2 Saturation: 60 %
Potassium: 3.6 mmol/L (ref 3.5–5.1)
Sodium: 140 mmol/L (ref 135–145)
TCO2: 29 mmol/L (ref 22–32)
pCO2, Ven: 45.1 mmHg (ref 44–60)
pH, Ven: 7.392 (ref 7.25–7.43)
pO2, Ven: 32 mmHg (ref 32–45)

## 2024-10-11 LAB — RETICULOCYTES
Immature Retic Fract: 22.1 % — ABNORMAL HIGH (ref 2.3–15.9)
RBC.: 2.8 MIL/uL — ABNORMAL LOW (ref 4.22–5.81)
Retic Count, Absolute: 66.1 K/uL (ref 19.0–186.0)
Retic Ct Pct: 2.4 % (ref 0.4–3.1)

## 2024-10-11 LAB — POCT I-STAT 7, (LYTES, BLD GAS, ICA,H+H)
Acid-Base Excess: 0 mmol/L (ref 0.0–2.0)
Bicarbonate: 23.9 mmol/L (ref 20.0–28.0)
Calcium, Ion: 1.18 mmol/L (ref 1.15–1.40)
HCT: 24 % — ABNORMAL LOW (ref 39.0–52.0)
Hemoglobin: 8.2 g/dL — ABNORMAL LOW (ref 13.0–17.0)
O2 Saturation: 92 %
Potassium: 3.6 mmol/L (ref 3.5–5.1)
Sodium: 140 mmol/L (ref 135–145)
TCO2: 25 mmol/L (ref 22–32)
pCO2 arterial: 36.2 mmHg (ref 32–48)
pH, Arterial: 7.428 (ref 7.35–7.45)
pO2, Arterial: 60 mmHg — ABNORMAL LOW (ref 83–108)

## 2024-10-11 LAB — IRON AND TIBC
Iron: 85 ug/dL (ref 45–182)
Saturation Ratios: 36 % (ref 17.9–39.5)
TIBC: 239 ug/dL — ABNORMAL LOW (ref 250–450)
UIBC: 154 ug/dL

## 2024-10-11 LAB — HEPATIC FUNCTION PANEL
ALT: 7 U/L (ref 0–44)
AST: 22 U/L (ref 15–41)
Albumin: 3.4 g/dL — ABNORMAL LOW (ref 3.5–5.0)
Alkaline Phosphatase: 151 U/L — ABNORMAL HIGH (ref 38–126)
Bilirubin, Direct: 0.3 mg/dL — ABNORMAL HIGH (ref 0.0–0.2)
Indirect Bilirubin: 0.4 mg/dL (ref 0.3–0.9)
Total Bilirubin: 0.6 mg/dL (ref 0.0–1.2)
Total Protein: 8.1 g/dL (ref 6.5–8.1)

## 2024-10-11 LAB — APTT: aPTT: 40 s — ABNORMAL HIGH (ref 24–36)

## 2024-10-11 LAB — HEPARIN LEVEL (UNFRACTIONATED): Heparin Unfractionated: <0.1 [IU]/mL — ABNORMAL LOW (ref 0.30–0.70)

## 2024-10-11 LAB — HEPATITIS B SURFACE ANTIGEN: Hepatitis B Surface Ag: NONREACTIVE

## 2024-10-11 LAB — FERRITIN: Ferritin: 794 ng/mL — ABNORMAL HIGH (ref 24–336)

## 2024-10-11 SURGERY — RIGHT/LEFT HEART CATH AND CORONARY ANGIOGRAPHY
Anesthesia: Moderate Sedation

## 2024-10-11 MED ORDER — PANTOPRAZOLE SODIUM 40 MG PO TBEC
40.0000 mg | DELAYED_RELEASE_TABLET | Freq: Every day | ORAL | Status: DC
  Administered 2024-10-12 – 2024-10-13 (×2): 40 mg via ORAL
  Filled 2024-10-11 (×2): qty 1

## 2024-10-11 MED ORDER — MIDAZOLAM HCL (PF) 2 MG/2ML IJ SOLN
INTRAMUSCULAR | Status: DC | PRN
  Administered 2024-10-11: 1 mg via INTRAVENOUS

## 2024-10-11 MED ORDER — SODIUM CHLORIDE 0.9% FLUSH: 3.0000 mL | INTRAVENOUS | Status: DC | PRN

## 2024-10-11 MED ORDER — FENTANYL CITRATE (PF) 100 MCG/2ML IJ SOLN
INTRAMUSCULAR | Status: AC
  Filled 2024-10-11: qty 2

## 2024-10-11 MED ORDER — SODIUM CHLORIDE 0.9% FLUSH
3.0000 mL | Freq: Two times a day (BID) | INTRAVENOUS | Status: DC
  Administered 2024-10-11 – 2024-10-13 (×4): 3 mL via INTRAVENOUS

## 2024-10-11 MED ORDER — HYDRALAZINE HCL 20 MG/ML IJ SOLN: 10.0000 mg | INTRAMUSCULAR | Status: AC | PRN

## 2024-10-11 MED ORDER — HEPARIN (PORCINE) IN NACL 2000-0.9 UNIT/L-% IV SOLN
INTRAVENOUS | Status: DC | PRN
  Administered 2024-10-11: 1000 mL

## 2024-10-11 MED ORDER — SODIUM CHLORIDE 0.9 % IV SOLN: 250.0000 mL | INTRAVENOUS | Status: AC | PRN

## 2024-10-11 MED ORDER — SODIUM CHLORIDE 0.9 % IV SOLN: INTRAVENOUS | Status: DC

## 2024-10-11 MED ORDER — HEPARIN SODIUM (PORCINE) 1000 UNIT/ML IJ SOLN
INTRAMUSCULAR | Status: AC
  Filled 2024-10-11: qty 10

## 2024-10-11 MED ORDER — LIDOCAINE HCL 1 % IJ SOLN
INTRAMUSCULAR | Status: AC
  Filled 2024-10-11: qty 20

## 2024-10-11 MED ORDER — ENSURE PLUS HIGH PROTEIN PO LIQD: 237.0000 mL | Freq: Two times a day (BID) | ORAL | Status: DC

## 2024-10-11 MED ORDER — ACETAMINOPHEN 325 MG PO TABS
650.0000 mg | ORAL_TABLET | ORAL | Status: DC | PRN
  Administered 2024-10-12: 650 mg via ORAL
  Filled 2024-10-11: qty 2

## 2024-10-11 MED ORDER — HEPARIN (PORCINE) 25000 UT/250ML-% IV SOLN
1650.0000 [IU]/h | INTRAVENOUS | Status: DC
  Administered 2024-10-11: 900 [IU]/h via INTRAVENOUS
  Administered 2024-10-12: 1350 [IU]/h via INTRAVENOUS
  Administered 2024-10-13: 1600 [IU]/h via INTRAVENOUS
  Filled 2024-10-11 (×3): qty 250

## 2024-10-11 MED ORDER — TAMSULOSIN HCL 0.4 MG PO CAPS
0.4000 mg | ORAL_CAPSULE | Freq: Every day | ORAL | Status: DC
  Administered 2024-10-12 – 2024-10-13 (×2): 0.4 mg via ORAL
  Filled 2024-10-11 (×2): qty 1

## 2024-10-11 MED ORDER — ASPIRIN 81 MG PO CHEW
81.0000 mg | CHEWABLE_TABLET | ORAL | Status: AC
  Administered 2024-10-11: 81 mg via ORAL

## 2024-10-11 MED ORDER — MIDAZOLAM HCL 2 MG/2ML IJ SOLN
INTRAMUSCULAR | Status: AC
  Filled 2024-10-11: qty 2

## 2024-10-11 MED ORDER — IOHEXOL 300 MG/ML  SOLN
INTRAMUSCULAR | Status: DC | PRN
  Administered 2024-10-11: 43 mL

## 2024-10-11 MED ORDER — LIDOCAINE HCL (PF) 1 % IJ SOLN
INTRAMUSCULAR | Status: DC | PRN
  Administered 2024-10-11: 5 mL

## 2024-10-11 MED ORDER — HYDRALAZINE HCL 25 MG PO TABS
25.0000 mg | ORAL_TABLET | Freq: Three times a day (TID) | ORAL | Status: DC
  Administered 2024-10-11 – 2024-10-13 (×6): 25 mg via ORAL
  Filled 2024-10-11 (×6): qty 1

## 2024-10-11 MED ORDER — ATORVASTATIN CALCIUM 20 MG PO TABS
40.0000 mg | ORAL_TABLET | Freq: Every day | ORAL | Status: DC
  Administered 2024-10-12 – 2024-10-13 (×2): 40 mg via ORAL
  Filled 2024-10-11 (×2): qty 2

## 2024-10-11 MED ORDER — HYDROXYZINE HCL 25 MG PO TABS
25.0000 mg | ORAL_TABLET | Freq: Two times a day (BID) | ORAL | Status: DC
  Administered 2024-10-11 – 2024-10-13 (×4): 25 mg via ORAL
  Filled 2024-10-11 (×4): qty 1

## 2024-10-11 MED ORDER — ONDANSETRON HCL 4 MG/2ML IJ SOLN
4.0000 mg | Freq: Four times a day (QID) | INTRAMUSCULAR | Status: DC | PRN
  Filled 2024-10-11: qty 2

## 2024-10-11 MED ORDER — CALCIUM ACETATE (PHOS BINDER) 667 MG PO CAPS
1334.0000 mg | ORAL_CAPSULE | ORAL | Status: DC
  Administered 2024-10-12: 1334 mg via ORAL
  Filled 2024-10-11: qty 2

## 2024-10-11 MED ORDER — VERAPAMIL HCL 2.5 MG/ML IV SOLN
INTRAVENOUS | Status: AC
  Filled 2024-10-11: qty 2

## 2024-10-11 MED ORDER — FENTANYL CITRATE (PF) 100 MCG/2ML IJ SOLN
INTRAMUSCULAR | Status: DC | PRN
  Administered 2024-10-11: 25 ug via INTRAVENOUS

## 2024-10-11 MED ORDER — HEPARIN (PORCINE) IN NACL 1000-0.9 UT/500ML-% IV SOLN
INTRAVENOUS | Status: AC
  Filled 2024-10-11: qty 1000

## 2024-10-11 MED ORDER — LABETALOL HCL 5 MG/ML IV SOLN: 10.0000 mg | INTRAVENOUS | Status: AC | PRN

## 2024-10-11 MED ORDER — ASPIRIN 81 MG PO CHEW
CHEWABLE_TABLET | ORAL | Status: AC
  Filled 2024-10-11: qty 1

## 2024-10-11 SURGICAL SUPPLY — 16 items
CATH BALLN WEDGE 5F 110CM (CATHETERS) IMPLANT
CATH INFINITI 5FR MULTPACK ANG (CATHETERS) IMPLANT
CATH SWAN GANZ 7F STRAIGHT (CATHETERS) IMPLANT
DEVICE CLOSURE MYNXGRIP 5F (Vascular Products) IMPLANT
DRAPE BRACHIAL (DRAPES) IMPLANT
GUIDEWIRE .025 260CM (WIRE) IMPLANT
GUIDEWIRE EMER 3M J .025X150CM (WIRE) IMPLANT
KIT MICROPUNCTURE VSI 5F STIFF (SHEATH) IMPLANT
KIT SYRINGE INJ CVI SPIKEX1 (MISCELLANEOUS) IMPLANT
PACK CARDIAC CATH (CUSTOM PROCEDURE TRAY) ×2 IMPLANT
SET ATX-X65L (MISCELLANEOUS) IMPLANT
SHEATH AVANTI 5FR X 11CM (SHEATH) IMPLANT
SHEATH AVANTI 7FRX11 (SHEATH) IMPLANT
STATION PROTECTION PRESSURIZED (MISCELLANEOUS) IMPLANT
WIRE EMERALD ST .035X150CM (WIRE) IMPLANT
WIRE GUIDERIGHT .035X150 (WIRE) IMPLANT

## 2024-10-11 NOTE — Progress Notes (Signed)
 PHARMACY - ANTICOAGULATION CONSULT NOTE  Pharmacy Consult for Heparin  Indication: AF  Allergies[1]  Patient Measurements: Height: 6' (182.9 cm) Weight: 64.9 kg (143 lb 1.3 oz) IBW/kg (Calculated) : 77.6 HEPARIN  DW (KG): 64.9  Vital Signs: Temp: 96.8 F (36 C) (12/30 0916) Temp Source: Temporal (12/30 0916) BP: 147/100 (12/30 0930) Pulse Rate: 69 (12/30 0930)  Labs: Recent Labs    10/11/24 0841 10/11/24 0842  HGB 8.8* 8.2*  HCT 26.0* 24.0*    Estimated Creatinine Clearance: 14.1 mL/min (A) (by C-G formula based on SCr of 4.85 mg/dL (H)).   Medical History: Past Medical History:  Diagnosis Date   AKI (acute kidney injury) 08/30/2018   Diabetes mellitus without complication (HCC)    no meds since weight loss   Dialysis patient    Mon, Wed, Fri   Hyperkalemia 02/04/2021   Hypertension    Metabolic acidosis, increased anion gap 02/04/2021   Renal disorder    Sepsis without acute organ dysfunction (HCC) 11/30/2023    Medications:  Medications Prior to Admission  Medication Sig Dispense Refill Last Dose/Taking   apixaban  (ELIQUIS ) 5 MG TABS tablet Take 1 tablet (5 mg total) by mouth 2 (two) times daily. 180 tablet 1 10/07/2024   Calcium  Acetate 667 MG TABS Take 1,334 mg by mouth 3 (three) times a week.   10/10/2024   hydrALAZINE  (APRESOLINE ) 25 MG tablet Take 25 mg by mouth 3 (three) times daily.   10/11/2024 Morning   hydrOXYzine  (ATARAX ) 25 MG tablet Take 25 mg by mouth 2 (two) times daily.   10/10/2024   irbesartan  (AVAPRO ) 300 MG tablet Take 1 tablet (300 mg total) by mouth daily. 90 tablet 0 Taking   Multiple Vitamin (MULTIVITAMIN ADULT PO) Take 1 tablet by mouth daily.   10/10/2024   omeprazole  (PRILOSEC) 20 MG capsule Take 20 mg by mouth every morning.   10/10/2024   tamsulosin  (FLOMAX ) 0.4 MG CAPS capsule Take 0.4 mg by mouth daily.   10/10/2024   albumin  human 25 % bottle Inject 25 grams no matter what if more then 5L are removed then another 25 grams  (Patient not taking: Reported on 09/30/2024)   Not Taking   amLODipine  (NORVASC ) 10 MG tablet Take 10 mg by mouth daily. (Patient not taking: Reported on 09/30/2024)   Not Taking   blood glucose meter kit and supplies KIT Dispense based on patient and insurance preference. Use up to four times daily as directed. (FOR ICD-9 250.00, 250.01). 1 each 0    epoetin  alfa-epbx (RETACRIT ) 10000 UNIT/ML injection Inject 1 mL (10,000 Units total) into the vein every Monday, Wednesday, and Friday with hemodialysis. (Patient not taking: Reported on 09/30/2024)   Not Taking   melatonin 5 MG TABS Take 1 tablet (5 mg total) by mouth at bedtime as needed. (Patient not taking: Reported on 09/30/2024) 20 tablet 0 Not Taking   metoprolol  tartrate (LOPRESSOR ) 25 MG tablet Take 25 mg by mouth 2 (two) times daily. (Patient not taking: Reported on 09/30/2024)   Not Taking    Assessment: 63 y/o M with a h/o AF on Eliquis  recently increased to 5 mg bid due to stroke risk (CHA2DS2/VAS of 3) but previously 2.5 mg bid subtherapeutic dose for cirrhosis/ESRD increased bleeding risk. LHC today after abnormal Myoview . Pharmacy consulted for heparin  bridging beginning 8 hours post sheath removal.      Goal of Therapy:  Heparin  level 0.3-0.7 units/ml aPTT 66-102 seconds Monitor platelets by anticoagulation protocol: Yes   Plan:  Start heparin   infusion at 900 units/hr 8 h post sheath removal at 1700.  Baseline aPTT, HL, CBC.  Check aPTT 8 hours after infusion start Switch to HL when able Daily HL/CBC   Shomari Scicchitano D 10/11/2024,9:45 AM      [1] No Known Allergies

## 2024-10-11 NOTE — H&P (Signed)
 " History and Physical    Casey Franco:[id number] DOB: [birth year] DOA: 10/11/2024  PCP: [employer] (Confirm with patient/family/NH records and if not entered, this has to be entered at [facility] point of entry) Patient coming from: Home  I have personally briefly reviewed patient's old medical records in [facility]  Chief Complaint: SOB  HPI: Casey Franco is a 63 y.o. male with medical history significant of chronic HFrEF with LVEF 50% on most recent stress test, ESRD on HD MWF, HTN, HLD, PAF on Eliquis , hepatitis C and liver cirrhosis, presented with worsening of exertional dyspnea.  Patient came in today for elective LHC and RHC.  Patient reported he has been having worsening of exertional dyspnea recently, but no significant chest pains, there is no orthopnea.  Underwent stress test recently which showed a moderate size severe partially reversible defect involving the anterior wall apex and apical inferior segment consistent with scar and peri-infarct ischemia.  LVEF 51%.  Today's cath found L main stenosis. Afebrile, blood pressure 139/93 O2 saturation 96% on room air.  Chest x-ray showed mild pulmonary congestion  Review of Systems: As per HPI otherwise 14 point review of systems negative.    Past Medical History:  Diagnosis Date   AKI (acute kidney injury) 08/30/2018   Diabetes mellitus without complication (HCC)    no meds since weight loss   Dialysis patient    Mon, Wed, Fri   Hyperkalemia 02/04/2021   Hypertension    Metabolic acidosis, increased anion gap 02/04/2021   Renal disorder    Sepsis without acute organ dysfunction (HCC) 11/30/2023    Past Surgical History:  Procedure Laterality Date   CATARACT EXTRACTION W/PHACO Right 10/08/2023   Procedure: CATARACT EXTRACTION PHACO AND INTRAOCULAR LENS PLACEMENT (IOC) RIGHT DIABETIC 12.19 01:08.8;  Surgeon: Enola Feliciano Hugger, MD;  Location: [facility];  Service: Ophthalmology;   Laterality: Right;   CATARACT EXTRACTION W/PHACO Left 10/22/2023   Procedure: CATARACT EXTRACTION PHACO AND INTRAOCULAR LENS PLACEMENT (IOC) LEFT DIABETIC MALYUGIN;  Surgeon: Enola Feliciano Hugger, MD;  Location: [facility];  Service: Ophthalmology;  Laterality: Left;  7.66 0:54.2   COLONOSCOPY WITH PROPOFOL  N/A 11/21/2021   Procedure: COLONOSCOPY WITH PROPOFOL ;  Surgeon: Unk Corinn Skiff, MD;  Location: [facility] ENDOSCOPY;  Service: Gastroenterology;  Laterality: N/A;   DIALYSIS/PERMA CATHETER INSERTION Right 05/20/2024   Procedure: DIALYSIS/PERMA CATHETER INSERTION;  Surgeon: Marea Selinda RAMAN, MD;  Location: [facility] INVASIVE CV LAB;  Service: Cardiovascular;  Laterality: Right;   DIALYSIS/PERMA CATHETER REPAIR N/A 12/15/2023   Procedure: DIALYSIS/PERMA CATHETER REPAIR;  Surgeon: Jama Cordella MATSU, MD;  Location: [facility] INVASIVE CV LAB;  Service: Cardiovascular;  Laterality: N/A;   ESOPHAGOGASTRODUODENOSCOPY N/A 03/03/2024   Procedure: EGD (ESOPHAGOGASTRODUODENOSCOPY);  Surgeon: Unk Corinn Skiff, MD;  Location: [facility] ENDOSCOPY;  Service: Gastroenterology;  Laterality: N/A;   ESOPHAGOGASTRODUODENOSCOPY (EGD) WITH PROPOFOL  N/A 11/21/2021   Procedure: ESOPHAGOGASTRODUODENOSCOPY (EGD) WITH PROPOFOL ;  Surgeon: Unk Corinn Skiff, MD;  Location: [facility] ENDOSCOPY;  Service: Gastroenterology;  Laterality: N/A;   INSERTION OF DIALYSIS CATHETER     LEG SURGERY  1987     reports that he has quit smoking. His smoking use included cigarettes. He has never used smokeless tobacco. He reports current alcohol use of about 7.0 standard drinks of alcohol per week. He reports that he does not currently use drugs after having used the following drugs: Cocaine  and Marijuana.  Allergies[1]  Family History  Problem Relation Age of Onset   Diabetes Mellitus II  Sister    Diabetes Mellitus II Paternal Grandmother      Prior to Admission medications  Medication Sig Start Date End Date Taking? Authorizing Provider   apixaban  (ELIQUIS ) 5 MG TABS tablet Take 1 tablet (5 mg total) by mouth 2 (two) times daily. 08/16/24  Yes Gollan, Timothy J, MD  Calcium  Acetate 667 MG TABS Take 1,334 mg by mouth 3 (three) times a week.   Yes [provider]  hydrALAZINE  (APRESOLINE ) 25 MG tablet Take 25 mg by mouth 3 (three) times daily.   Yes [provider]  hydrOXYzine  (ATARAX ) 25 MG tablet Take 25 mg by mouth 2 (two) times daily. 09/22/24  Yes [provider]  irbesartan  (AVAPRO ) 300 MG tablet Take 1 tablet (300 mg total) by mouth daily. 12/23/23 09/30/24 Yes Awanda City, MD  Multiple Vitamin (MULTIVITAMIN ADULT PO) Take 1 tablet by mouth daily.   Yes [provider]  omeprazole  (PRILOSEC) 20 MG capsule Take 20 mg by mouth every morning. 07/02/24  Yes [provider]  tamsulosin  (FLOMAX ) 0.4 MG CAPS capsule Take 0.4 mg by mouth daily. 09/19/24  Yes [provider]  albumin  human 25 % bottle Inject 25 grams no matter what if more then 5L are removed then another 25 grams Patient not taking: Reported on 09/30/2024 12/31/23   Unk Corinn Skiff, MD  amLODipine  (NORVASC ) 10 MG tablet Take 10 mg by mouth daily. Patient not taking: Reported on 09/30/2024    [provider]  blood glucose meter kit and supplies KIT Dispense based on patient and insurance preference. Use up to four times daily as directed. (FOR ICD-9 250.00, 250.01). 09/01/18   Tobie Press, MD  epoetin  alfa-epbx (RETACRIT ) 10000 UNIT/ML injection Inject 1 mL (10,000 Units total) into the vein every Monday, Wednesday, and Friday with hemodialysis. Patient not taking: Reported on 09/30/2024 12/25/23   Awanda City, MD  melatonin 5 MG TABS Take 1 tablet (5 mg total) by mouth at bedtime as needed. Patient not taking: Reported on 09/30/2024 12/23/23   Awanda City, MD  metoprolol  tartrate (LOPRESSOR ) 25 MG tablet Take 25 mg by mouth 2 (two) times daily. Patient not taking: Reported on 09/30/2024 04/18/24   [provider]    Physical Exam: Vitals:   10/11/24 1030 10/11/24 1100 10/11/24 1130 10/11/24 1200  BP: (!) 150/89 (!) 144/93 (!) 128/97 (!) 139/96  Pulse: 69 67  68  Resp: 14 16 (!) 21 17  Temp:      TempSrc:      SpO2: 98% 96% 98% 97%  Weight:      Height:        Constitutional: NAD, calm, comfortable Vitals:   10/11/24 1030 10/11/24 1100 10/11/24 1130 10/11/24 1200  BP: (!) 150/89 (!) 144/93 (!) 128/97 (!) 139/96  Pulse: 69 67  68  Resp: 14 16 (!) 21 17  Temp:      TempSrc:      SpO2: 98% 96% 98% 97%  Weight:      Height:       Eyes: PERRL, lids and conjunctivae normal ENMT: Mucous membranes are moist. Posterior pharynx clear of any exudate or lesions.Normal dentition.  Neck: normal, supple, no masses, no thyromegaly Respiratory: clear to auscultation bilaterally, no wheezing, no crackles. Normal respiratory effort. No accessory muscle use.  Cardiovascular: Regular rate and rhythm, no murmurs / rubs / gallops. No extremity edema. 2+ pedal pulses. No carotid bruits.  Abdomen: no tenderness, no masses palpated. No hepatosplenomegaly. Bowel sounds positive.  Musculoskeletal: no clubbing / cyanosis. No joint deformity upper and lower extremities. Good ROM, no contractures. Normal muscle tone.  Skin: no rashes, lesions, ulcers. No induration Neurologic: CN 2-12 grossly intact. Sensation intact, DTR normal. Strength 5/5 in all 4.  Psychiatric: Normal judgment and insight. Alert and oriented x 3. Normal mood.     Labs on Admission: I have personally reviewed following labs and imaging studies  CBC: Recent Labs  Lab 10/11/24 0841 10/11/24 0842  HGB 8.8* 8.2*  HCT 26.0* 24.0*   Basic Metabolic Panel: Recent Labs  Lab 10/11/24 0841 10/11/24 0842  NA 140 140  K 3.6 3.6   GFR: Estimated Creatinine Clearance: 14.1 mL/min (A) (by C-G formula based on SCr of 4.85 mg/dL (H)). Liver Function Tests: No results for input(s): AST, ALT, ALKPHOS, BILITOT, PROT,  ALBUMIN  in the last 168 hours. No results for input(s): LIPASE, AMYLASE in the last 168 hours. No results for input(s): AMMONIA in the last 168 hours. Coagulation Profile: No results for input(s): INR, PROTIME in the last 168 hours. Cardiac Enzymes: No results for input(s): CKTOTAL, CKMB, CKMBINDEX, TROPONINI in the last 168 hours. BNP (last 3 results) No results for input(s): PROBNP in the last 8760 hours. HbA1C: No results for input(s): HGBA1C in the last 72 hours. CBG: No results for input(s): GLUCAP in the last 168 hours. Lipid Profile: No results for input(s): CHOL, HDL, LDLCALC, TRIG, CHOLHDL, LDLDIRECT in the last 72 hours. Thyroid Function Tests: No results for input(s): TSH, T4TOTAL, FREET4, T3FREE, THYROIDAB in the last 72 hours. Anemia Panel: No results for input(s): VITAMINB12, FOLATE, FERRITIN, TIBC, IRON, RETICCTPCT in the last 72 hours. Urine analysis:    Component Value Date/Time   COLORURINE YELLOW (A) 08/30/2018 1810   APPEARANCEUR CLEAR (A) 08/30/2018 1810   LABSPEC 1.012 08/30/2018 1810   PHURINE 6.0 08/30/2018 1810   GLUCOSEU 150 (A) 08/30/2018 1810   HGBUR NEGATIVE 08/30/2018 1810   BILIRUBINUR NEGATIVE 08/30/2018 1810   KETONESUR NEGATIVE 08/30/2018 1810   PROTEINUR >=300 (A) 08/30/2018 1810   NITRITE NEGATIVE 08/30/2018 1810   LEUKOCYTESUR NEGATIVE 08/30/2018 1810    Radiological Exams on Admission: CARDIAC CATHETERIZATION Result Date: 10/11/2024 Conclusions: Severe LMCA and LAD disease with heavy calcification of the coronary arteries, as detailed below. Severely elevated left heart, right heart, and pulmonary artery pressures.  Equalization of end-diastolic pressures noted, which can be seen with restrictive/constrictive physiology. Normal Fick cardiac output/index. Recommendation: Admit for fluid removal via hemodialysis and optimization of goal-directed medical therapy. Transfer to [facility]  when bed available for cardiac surgery +/- advanced heart failure consultation. Aggressive secondary prevention of coronary artery disease. Initiate heparin  infusion in lieu of apixaban  8 hours after removal of femoral sheaths. Lonni Hanson, MD [facility]   EKG: Independently reviewed.  Sinus rhythm, no acute ST changes.  Assessment/Plan Principal Problem:   Acute on chronic heart failure with mildly reduced ejection fraction (HFmrEF) (HCC) Active Problems:   Chronic heart failure with mildly reduced ejection fraction (HFmrEF) (HCC)   Abnormal stress test  (please populate well all problems here in Problem List. (For example, if patient is on BP meds at home and you resume or decide to hold them, it is a problem that needs to be her. Same for CAD, COPD, HLD and so on)  Acute on chronic HFrEF decompensation - Consult nephrology for dialysis.  Currently patient is nonhypoxic, blood pressure appears to be stable. - BP/CHF medication as per cardiology  CAD L main coronary artery stenosis -  Cardiology will arrange transfer to [facility] for CT surgery evaluation.  Today we were told CT surgery service if full and backed up on cases at [facility].  Patient stay at [facility] for CHF management.  ESRD on HD - Resume routine HD schedule MWF. - Nephrology consulted  Recently diagnosed PAF - On heparin  drip as a post-measure, expect to switch back to Eliquis  tonight - Continue telemonitoring  Chronic normocytic anemia - H&H stable - Check iron study and reticulocyte count  Cirrhosis - Appears to be compensated - Check LFT  Moderate protein calorie malnutrition -BMI= 19 - Start Ensure   DVT prophylaxis: Eliquis  Code Status: Full code Family Communication: Left sister message Disposition Plan: Patient is sick with newly found L main coronary artery stenosis requiring inpatient CT surgery evaluation and decompensated CHF with ESRD on HD requiring additional HD.  Expect more than  2 midnight hospital stay. Consults called: Cardiology and nephrology Admission status: Telemetry admission   Cort ONEIDA Mana MD [facility] Pager [phone]  10/11/2024, 12:06 PM       [1] No Known Allergies  "

## 2024-10-11 NOTE — Progress Notes (Addendum)
 "  Progress Note  Patient Name: MARKY BURESH Date of Encounter: 10/11/2024 [facility] Cardiologist: Timothy Gollan, MD   Interval Summary   Patient presented today for outpatient catheterization for workup of abnormal stress test and cardiomyopathy with moderately reduced LVEF.  He was noted to have severe CAD involving the LMCA and LAD as well as severely elevated left and right heart filling pressures.  He has been admitted to the hospitalist service for medical optimization, including more aggressive fluid removal via hemodialysis, and ultimate transfer to [facility] when a bed becomes available for consideration of CABG versus high risk PCI versus palliative medical therapy.  Patient reports that he has not had any chest pain though he gets out of breath with minimal activity.  He endorses some orthopnea earlier this year, though this has improved over the last few months (he still gets it from time to time).  Vital Signs Vitals:   10/11/24 1230 10/11/24 1330 10/11/24 1400 10/11/24 1450  BP: (!) 147/81 129/84 (!) 139/97 (!) 165/102  Pulse: 69   72  Resp: 17 15 13 16   Temp:    (!) 97.5 F (36.4 C)  TempSrc:    Oral  SpO2: 98% 96%  100%  Weight:      Height:       No intake or output data in the 24 hours ending 10/11/24 1836    10/11/2024    7:10 AM 09/27/2024    8:07 AM 08/30/2024    9:36 AM  Last 3 Weights  Weight (lbs) 143 lb 1.3 oz 143 lb 148 lb 3.2 oz  Weight (kg) 64.9 kg 64.864 kg 67.223 kg      Telemetry/ECG  Sinus rhythm- Personally Reviewed  Physical Exam  GEN: No acute distress.   Neck: No JVD Cardiac: RRR, no murmurs, rubs, or gallops.  Respiratory: Clear to auscultation bilaterally. GI: Soft, nontender, non-distended  MS: No edema  Assessment & Plan  Coronary artery disease: Patient without any chest pain though he has had dyspnea with minimal activity.  This could certainly be due to his severely LMCA/LAD disease and/or heart failure.   Continue aspirin  and add high intensity statin therapy for secondary prevention.  Patient is awaiting transfer to [facility] to determine if CABG or high risk PCI would be options.  Given the degree of his disease, particularly in the LMCA/LAD territory and extensive calcification, he may need not to have suitable targets for intervention.  Acute on chronic biventricular heart failure: Mr. Kampa reports significant dyspnea with minimal activity and has even had some orthopnea over the last few months.  His right and left heart pressures were severely elevated, as were his pulmonary artery pressures.  He will need more aggressive fluid removal with hemodialysis given that he has Jaeli Grubb-stage renal disease.  Consultation with the advanced heart failure team at [facility] may be helpful after transfer.  In the meantime, I will transition him from irbesartan  to Entresto and also switch him from metoprolol  to tartrate to carvedilol.  Paroxysmal atrial fibrillation: Mr. Bunner has been in sinus rhythm here.  I will hold his apixaban  pending consultation for CABG versus high risk PCI.  Initiate heparin  infusion 8 hours after right femoral sheaths were removed.  Continue monitoring hemoglobin given baseline anemia of chronic disease.  Transition metoprolol  to tartrate to carvedilol, as above, in the setting of heart failure.  Marquise Wicke-stage renal disease: Appreciate nephrology consultation.  For questions or updates, please contact [facility]  Please consult [email or web address] for contact info under [facility].       Signed, Lonni Hanson, MD   "

## 2024-10-11 NOTE — Progress Notes (Signed)
 " [facility]  ROUNDING NOTE   Subjective:   Casey Franco is a 63 year old male with past medical conditions including hypertension, atrial fibrillation on Eliquis , hepatitis C with liver cirrhosis, and end-stage renal disease on hemodialysis.  Patient presents to the hospital for elective cardiac catheterization and is currently admitted for Abnormal stress test [R94.39] Acute on chronic heart failure with mildly reduced ejection fraction (HFmrEF) (HCC) [I50.23]  Patient is known to our practice and receives outpatient dialysis treatments at [facility] on a MWF schedule, supervised by Dr. Dennise.  Patient did receive full dialysis treatment yesterday prior to arrival.  Patient is currently seen postprocedure.  Room air.  No signs of distress or discomfort.  Patient states he is currently awaiting a bed at [facility] for transfer.  Labs at this time are stable, no urgent indication for dialysis.  We have been consulted to maintain scheduled dialysis during this admission.    Objective:  Vital signs in last 24 hours:  Temp:  [96.8 F (36 C)-98.1 F (36.7 C)] 97.5 F (36.4 C) (12/30 1450) Pulse Rate:  [67-74] 72 (12/30 1450) Resp:  [12-24] 16 (12/30 1450) BP: (113-165)/(75-102) 165/102 (12/30 1450) SpO2:  [87 %-100 %] 100 % (12/30 1450) Weight:  [64.9 kg] 64.9 kg (12/30 0710)  Weight change:  Filed Weights   10/11/24 0710  Weight: 64.9 kg    Intake/Output: No intake/output data recorded.   Intake/Output this shift:  No intake/output data recorded.  Physical Exam: General: NAD  Head: Normocephalic, atraumatic. Moist oral mucosal membranes  Eyes: Anicteric  Lungs:  Clear to auscultation normal effort  Heart: Regular rate and rhythm  Abdomen:  Soft, nontender  Extremities: No peripheral edema.  Neurologic: Awake, alert, conversant  Skin: Warm,dry, no rash  Access: Lt Permcath    Basic Metabolic Panel: Recent Labs  Lab 10/11/24 0841  10/11/24 0842  NA 140 140  K 3.6 3.6    Liver Function Tests: Recent Labs  Lab 10/11/24 1536  AST 22  ALT 7  ALKPHOS 151*  BILITOT 0.6  PROT 8.1  ALBUMIN  3.4*   No results for input(s): LIPASE, AMYLASE in the last 168 hours. No results for input(s): AMMONIA in the last 168 hours.  CBC: Recent Labs  Lab 10/11/24 0841 10/11/24 0842  HGB 8.8* 8.2*  HCT 26.0* 24.0*    Cardiac Enzymes: No results for input(s): CKTOTAL, CKMB, CKMBINDEX, TROPONINI in the last 168 hours.  BNP: Invalid input(s): POCBNP  CBG: No results for input(s): GLUCAP in the last 168 hours.  Microbiology: Results for orders placed or performed during the hospital encounter of 01/05/24  Acid Fast Culture with reflexed sensitivities     Status: None   Collection Time: 01/05/24 12:05 PM   Specimen: PATH Cytology Peritoneal fluid; Body Fluid  Result Value Ref Range Status   Acid Fast Culture Negative  Final    Comment: (NOTE) No acid fast bacilli isolated after 6 weeks. Performed At: [facility] [address] [place], [place] [id number] Jennette Shorter MD Ey:[id number]    Source of Sample PERITONEAL  Final    Comment: Performed at [facility], [address] [place], [place] [address]  Acid Fast Smear (AFB)     Status: None   Collection Time: 01/05/24 12:05 PM   Specimen: PATH Cytology Peritoneal fluid; Body Fluid  Result Value Ref Range Status   AFB Specimen Processing Concentration  Final   Acid Fast Smear Negative  Final  Comment: (NOTE) Performed At: [facility] [address] [place], [place] [id number] Jennette Shorter MD Ey:[id number]    Source (AFB) PERITONEAL  Final    Comment: Performed at [facility], [address] [place], [place] [address]  Body fluid culture w Gram Stain     Status: None   Collection Time: 01/05/24 12:05 PM   Specimen: PATH Cytology Peritoneal fluid; Body Fluid  Result Value Ref Range Status    Specimen Description   Final    PERITONEAL Performed at [facility], [address] [place], [place] [address]    Special Requests   Final    NONE Performed at [facility], [address] [place], [place] [address]    Gram Stain   Final    NO WBC SEEN MODERATE GRAM POSITIVE COCCI IN PAIRS Performed at [facility], [address], [place], [place] [address]    Culture   Final    MIXED ANAEROBIC FLORA PRESENT.  CALL LAB IF FURTHER IID REQUIRED.   Report Status 01/07/2024 FINAL  Final    Coagulation Studies: No results for input(s): LABPROT, INR in the last 72 hours.  Urinalysis: No results for input(s): COLORURINE, LABSPEC, PHURINE, GLUCOSEU, HGBUR, BILIRUBINUR, KETONESUR, PROTEINUR, UROBILINOGEN, NITRITE, LEUKOCYTESUR in the last 72 hours.  Invalid input(s): APPERANCEUR    Imaging: CARDIAC CATHETERIZATION Result Date: 10/11/2024 Conclusions: Severe LMCA and LAD disease with heavy calcification of the coronary arteries, as detailed below. Severely elevated left heart, right heart, and pulmonary artery pressures.  Equalization of end-diastolic pressures noted, which can be seen with restrictive/constrictive physiology. Normal Fick cardiac output/index. Recommendation: Admit for fluid removal via hemodialysis and optimization of goal-directed medical therapy. Transfer to [facility] when bed available for cardiac surgery +/- advanced heart failure consultation. Aggressive secondary prevention of coronary artery disease. Initiate heparin  infusion in lieu of apixaban  8 hours after removal of femoral sheaths. Lonni Hanson, MD [facility]    Medications:    sodium chloride      heparin       aspirin        [START ON 10/12/2024] calcium  acetate  1,334 mg Oral Once per day on Monday Wednesday Friday   feeding supplement  237 mL Oral BID BM   hydrALAZINE   25 mg Oral TID   hydrOXYzine   25 mg Oral BID   pantoprazole   40 mg  Oral Daily   sodium chloride  flush  3 mL Intravenous Q12H   tamsulosin   0.4 mg Oral Daily   sodium chloride , acetaminophen , aspirin , ondansetron  (ZOFRAN ) IV, sodium chloride  flush  Assessment/ Plan:  Casey Franco is a 63 y.o.  male  Casey Franco is a 63 year old male with past medical conditions including hypertension, atrial fibrillation on Eliquis , hepatitis C with liver cirrhosis, and end-stage renal disease on hemodialysis.  Patient presents to the hospital for elective cardiac catheterization and is currently admitted for Abnormal stress test [R94.39] Acute on chronic heart failure with mildly reduced ejection fraction (HFmrEF) (HCC) [I50.23]  [facility]/MWF/right chest PermCath   End-stage renal disease on hemodialysis.  Last treatment received on Monday.  No urgent indication for dialysis today.  Next treatment scheduled for Wednesday.  2.  Coronary artery disease, L main coronary artery stenosis. Awaiting transfer to [facility] for CT surgery evaluation.   3. Anemia of chronic kidney disease Lab Results  Component Value Date   HGB 8.2 (L) 10/11/2024    Hgb reduced, will consider ESA with dialysis.  4. Secondary Hyperparathyroidism: with outpatient labs: PTH 575, phosphorus 4.9, calcium  8.6 on 09/19/24.    Lab Results  Component Value Date   PTH 31 08/30/2018   CALCIUM  9.0 09/27/2024   CAION 1.18 10/11/2024   PHOS 4.2 12/23/2023    Calcium  stable. Will obtain updated phos in am.    LOS: 0 Sharene Krikorian 12/30/20254:37 PM   "

## 2024-10-11 NOTE — Interval H&P Note (Signed)
 History and Physical Interval Note:  10/11/2024 8:04 AM  Casey Franco  has presented today for surgery, with the diagnosis of L Cath   Abnormal stress.  The various methods of treatment have been discussed with the patient and family. After consideration of risks, benefits and other options for treatment, the patient has consented to  Procedures: RIGHT/LEFT HEART CATH AND CORONARY ANGIOGRAPHY (N/A) as a surgical intervention.  The patient's history has been reviewed, patient examined, no change in status, stable for surgery.  I have reviewed the patient's chart and labs.  Questions were answered to the patient's satisfaction.  Procedure originally scheduled as LHC and possible PCI.  However, given cardiomyopathy with LVEF 40-45% and severe RV dysfunction as well as predominantly HF symptoms, we have agreed to perform RHC as well.  Cath Lab Visit (complete for each Cath Lab visit)  Clinical Evaluation Leading to the Procedure:   ACS: No.  Non-ACS:    Anginal/Heart Failure Classification: NYHA class III  Anti-ischemic medical therapy: Maximal Therapy (2 or more classes of medications)  Non-Invasive Test Results: Intermediate-risk stress test findings: cardiac mortality 1-3%/year  Prior CABG: No previous CABG  Casey Franco

## 2024-10-11 NOTE — Plan of Care (Signed)

## 2024-10-12 ENCOUNTER — Encounter (HOSPITAL_COMMUNITY): Payer: Self-pay

## 2024-10-12 DIAGNOSIS — N186 End stage renal disease: Secondary | ICD-10-CM

## 2024-10-12 DIAGNOSIS — I2510 Atherosclerotic heart disease of native coronary artery without angina pectoris: Secondary | ICD-10-CM | POA: Diagnosis not present

## 2024-10-12 DIAGNOSIS — I4819 Other persistent atrial fibrillation: Secondary | ICD-10-CM

## 2024-10-12 DIAGNOSIS — Z992 Dependence on renal dialysis: Secondary | ICD-10-CM

## 2024-10-12 DIAGNOSIS — R9439 Abnormal result of other cardiovascular function study: Secondary | ICD-10-CM

## 2024-10-12 DIAGNOSIS — I5022 Chronic systolic (congestive) heart failure: Secondary | ICD-10-CM | POA: Diagnosis not present

## 2024-10-12 DIAGNOSIS — I5023 Acute on chronic systolic (congestive) heart failure: Secondary | ICD-10-CM | POA: Diagnosis not present

## 2024-10-12 LAB — CBC
HCT: 22.9 % — ABNORMAL LOW (ref 39.0–52.0)
Hemoglobin: 7.3 g/dL — ABNORMAL LOW (ref 13.0–17.0)
MCH: 28.9 pg (ref 26.0–34.0)
MCHC: 31.9 g/dL (ref 30.0–36.0)
MCV: 90.5 fL (ref 80.0–100.0)
Platelets: 154 K/uL (ref 150–400)
RBC: 2.53 MIL/uL — ABNORMAL LOW (ref 4.22–5.81)
RDW: 17.3 % — ABNORMAL HIGH (ref 11.5–15.5)
WBC: 4.9 K/uL (ref 4.0–10.5)
nRBC: 0 % (ref 0.0–0.2)

## 2024-10-12 LAB — BASIC METABOLIC PANEL WITH GFR
Anion gap: 13 (ref 5–15)
BUN: 46 mg/dL — ABNORMAL HIGH (ref 8–23)
CO2: 23 mmol/L (ref 22–32)
Calcium: 9.1 mg/dL (ref 8.9–10.3)
Chloride: 101 mmol/L (ref 98–111)
Creatinine, Ser: 5.3 mg/dL — ABNORMAL HIGH (ref 0.61–1.24)
GFR, Estimated: 11 mL/min — ABNORMAL LOW
Glucose, Bld: 98 mg/dL (ref 70–99)
Potassium: 3.7 mmol/L (ref 3.5–5.1)
Sodium: 137 mmol/L (ref 135–145)

## 2024-10-12 LAB — PREPARE RBC (CROSSMATCH)

## 2024-10-12 LAB — PHOSPHORUS: Phosphorus: 2.1 mg/dL — ABNORMAL LOW (ref 2.5–4.6)

## 2024-10-12 LAB — HEPARIN LEVEL (UNFRACTIONATED)
Heparin Unfractionated: <0.1 [IU]/mL — ABNORMAL LOW (ref 0.30–0.70)
Heparin Unfractionated: 0.15 [IU]/mL — ABNORMAL LOW (ref 0.30–0.70)

## 2024-10-12 MED ORDER — SODIUM CHLORIDE 0.9% IV SOLUTION: Freq: Once | INTRAVENOUS | Status: AC

## 2024-10-12 MED ORDER — ATORVASTATIN CALCIUM 40 MG PO TABS: 40.0000 mg | ORAL_TABLET | Freq: Every day | ORAL | Status: DC

## 2024-10-12 MED ORDER — HEPARIN BOLUS VIA INFUSION
2000.0000 [IU] | Freq: Once | INTRAVENOUS | Status: AC
  Administered 2024-10-12: 2000 [IU] via INTRAVENOUS
  Filled 2024-10-12: qty 2000

## 2024-10-12 MED ORDER — CALCIUM ACETATE (PHOS BINDER) 667 MG PO CAPS: 1334.0000 mg | ORAL_CAPSULE | ORAL | Status: AC

## 2024-10-12 MED ORDER — EPOETIN ALFA-EPBX 10000 UNIT/ML IJ SOLN: 10000.0000 [IU] | INTRAMUSCULAR | Status: AC

## 2024-10-12 MED ORDER — HEPARIN SODIUM (PORCINE) 1000 UNIT/ML DIALYSIS: 1000.0000 [IU] | INTRAMUSCULAR | Status: DC | PRN

## 2024-10-12 MED ORDER — HEPARIN (PORCINE) 25000 UT/250ML-% IV SOLN: 1350.0000 [IU]/h | INTRAVENOUS | Status: DC

## 2024-10-12 MED ORDER — EPOETIN ALFA-EPBX 10000 UNIT/ML IJ SOLN
10000.0000 [IU] | INTRAMUSCULAR | Status: DC
  Administered 2024-10-12: 10000 [IU] via INTRAVENOUS

## 2024-10-12 MED ORDER — AMLODIPINE BESYLATE 10 MG PO TABS
10.0000 mg | ORAL_TABLET | Freq: Every day | ORAL | Status: DC
  Administered 2024-10-12: 10 mg via ORAL
  Filled 2024-10-12: qty 1

## 2024-10-12 MED ORDER — METOPROLOL TARTRATE 25 MG PO TABS: 25.0000 mg | ORAL_TABLET | Freq: Two times a day (BID) | ORAL | Status: DC

## 2024-10-12 MED ORDER — EPOETIN ALFA-EPBX 10000 UNIT/ML IJ SOLN
INTRAMUSCULAR | Status: AC
  Filled 2024-10-12: qty 1

## 2024-10-12 MED ORDER — CHLORHEXIDINE GLUCONATE CLOTH 2 % EX PADS
6.0000 | MEDICATED_PAD | Freq: Every day | CUTANEOUS | Status: DC
  Administered 2024-10-13: 6 via TOPICAL

## 2024-10-12 MED ORDER — METOPROLOL TARTRATE 25 MG PO TABS
25.0000 mg | ORAL_TABLET | Freq: Two times a day (BID) | ORAL | Status: DC
  Administered 2024-10-12 – 2024-10-13 (×3): 25 mg via ORAL
  Filled 2024-10-12 (×3): qty 1

## 2024-10-12 MED ORDER — ALTEPLASE 2 MG IJ SOLR: 2.0000 mg | Freq: Once | INTRAMUSCULAR | Status: DC | PRN

## 2024-10-12 MED ORDER — HEPARIN BOLUS VIA INFUSION
1950.0000 [IU] | Freq: Once | INTRAVENOUS | Status: AC
  Administered 2024-10-12: 1950 [IU] via INTRAVENOUS
  Filled 2024-10-12: qty 1950

## 2024-10-12 MED ORDER — ENSURE PLUS HIGH PROTEIN PO LIQD: 237.0000 mL | Freq: Two times a day (BID) | ORAL | Status: DC

## 2024-10-12 MED ORDER — HEPARIN SODIUM (PORCINE) 1000 UNIT/ML IJ SOLN
INTRAMUSCULAR | Status: AC
  Filled 2024-10-12: qty 4

## 2024-10-12 NOTE — Progress Notes (Signed)
 " [facility]  ROUNDING NOTE   Subjective:   Casey Franco is a 63 year old male with past medical conditions including hypertension, atrial fibrillation on Eliquis , hepatitis C with liver cirrhosis, and end-stage renal disease on hemodialysis.  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 Dennise.    Patient seen resting in bed Alert and oriented Remains on room air No lower extremity edema    Objective:  Vital signs in last 24 hours:  Temp:  [97.5 F (36.4 C)-98.4 F (36.9 C)] 97.7 F (36.5 C) (12/31 1014) Pulse Rate:  [69-81] 77 (12/31 1130) Resp:  [13-19] 16 (12/31 1130) BP: (114-165)/(81-102) 137/101 (12/31 1130) SpO2:  [96 %-100 %] 99 % (12/31 1130) Weight:  [70.6 kg-70.8 kg] 70.6 kg (12/31 1014)  Weight change:  Filed Weights   10/11/24 0710 10/12/24 0521 10/12/24 1014  Weight: 64.9 kg 70.8 kg 70.6 kg    Intake/Output: No intake/output data recorded.   Intake/Output this shift:  No intake/output data recorded.  Physical Exam: General: NAD  Head: Normocephalic, atraumatic. Moist oral mucosal membranes  Eyes: Anicteric  Lungs:  Clear to auscultation normal effort  Heart: Regular rate and rhythm  Abdomen:  Soft, nontender  Extremities: Trace peripheral edema.  Neurologic: Awake, alert, conversant  Skin: Warm,dry, no rash  Access: Lt Permcath    Basic Metabolic Panel: Recent Labs  Lab 10/11/24 0841 10/11/24 0842 10/12/24 0230  NA 140 140 137  K 3.6 3.6 3.7  CL  --   --  101  CO2  --   --  23  GLUCOSE  --   --  98  BUN  --   --  46*  CREATININE  --   --  5.30*  CALCIUM   --   --  9.1    Liver Function Tests: Recent Labs   Lab 10/11/24 1536  AST 22  ALT 7  ALKPHOS 151*  BILITOT 0.6  PROT 8.1  ALBUMIN  3.4*   No results for input(s): LIPASE, AMYLASE in the last 168 hours. No results for input(s): AMMONIA in the last 168 hours.  CBC: Recent Labs  Lab 10/11/24 0841 10/11/24 0842 10/12/24 0230  WBC  --   --  4.9  HGB 8.8* 8.2* 7.3*  HCT 26.0* 24.0* 22.9*  MCV  --   --  90.5  PLT  --   --  154    Cardiac Enzymes: No results for input(s): CKTOTAL, CKMB, CKMBINDEX, TROPONINI in the last 168 hours.  BNP: Invalid input(s): POCBNP  CBG: No results for input(s): GLUCAP in the last 168 hours.  Microbiology: Results for orders placed or performed during the hospital encounter of 01/05/24  Acid Fast Culture with reflexed sensitivities     Status: None   Collection Time: 01/05/24 12:05 PM   Specimen: PATH Cytology Peritoneal fluid; Body Fluid  Result Value Ref Range Status   Acid Fast Culture Negative  Final    Comment: (NOTE) No acid fast bacilli isolated after 6 weeks. Performed At: [facility] [address] [place], [place] [id number] Jennette Shorter MD Ey:[id number]    Source of Sample PERITONEAL  Final    Comment: Performed at [facility], [address] [place], [place] [address]  Acid Fast Smear (AFB)     Status: None   Collection Time: 01/05/24 12:05 PM   Specimen: PATH Cytology Peritoneal fluid; Body Fluid  Result Value Ref Range Status   AFB Specimen Processing Concentration  Final   Acid Fast Smear Negative  Final    Comment: (NOTE) Performed At: [facility] [address] [place], [place] [id number] Jennette Shorter MD Ey:[id number]    Source (AFB) PERITONEAL  Final    Comment: Performed at [facility], [address] [place], [place] [address]  Body fluid culture w Gram Stain     Status: None   Collection Time: 01/05/24 12:05 PM   Specimen: PATH Cytology Peritoneal fluid; Body Fluid  Result Value Ref Range Status    Specimen Description   Final    PERITONEAL Performed at [facility], [address], [place], [place] [address]    Special Requests   Final    NONE Performed at [facility], [address] [place], [place] [address]    Gram Stain   Final    NO WBC SEEN MODERATE GRAM POSITIVE COCCI IN PAIRS Performed at [facility], [address] [place], [place] [address]    Culture   Final    MIXED ANAEROBIC FLORA PRESENT.  CALL LAB IF FURTHER IID REQUIRED.   Report Status 01/07/2024 FINAL  Final    Coagulation Studies: No results for input(s): LABPROT, INR in the last 72 hours.  Urinalysis: No results for input(s): COLORURINE, LABSPEC, PHURINE, GLUCOSEU, HGBUR, BILIRUBINUR, KETONESUR, PROTEINUR, UROBILINOGEN, NITRITE, LEUKOCYTESUR in the last 72 hours.  Invalid input(s): APPERANCEUR    Imaging: DG Chest 1 View Result Date: 10/11/2024 CLINICAL DATA:  Congestive heart failure EXAM: DG CHEST 1V COMPARISON:  12/23/2023 FINDINGS: Right-sided dialysis catheter tip overlies the SVC. The heart is enlarged. Slight globular appearance of the cardiopericardial silhouette compared to prior may be due to differences in technique and positioning. Small to moderate right and small left pleural effusion. Vascular congestion. No confluent opacity. No pneumothorax. IMPRESSION: 1. Cardiomegaly with vascular congestion and bilateral pleural effusions, right greater than left. 2. Slight globular appearance of the cardiopericardial silhouette compared to prior may be due to differences in technique and positioning. Recommend correlation with echocardiogram to exclude pericardial effusion. Electronically Signed   By: Andrea Gasman M.D.   On: 10/11/2024 16:42   CARDIAC CATHETERIZATION Result Date: 10/11/2024 Conclusions: Severe LMCA and LAD disease with heavy calcification of the coronary arteries, as detailed below. Severely elevated left heart, right  heart, and pulmonary artery pressures.  Equalization of end-diastolic pressures noted, which can be seen with restrictive/constrictive physiology. Normal Fick cardiac output/index. Recommendation: Admit for fluid removal via hemodialysis and optimization of goal-directed medical therapy. Transfer to [facility] when bed available for cardiac surgery +/- advanced heart failure consultation. Aggressive secondary prevention of coronary artery disease. Initiate heparin  infusion in lieu of apixaban  8 hours after removal of femoral sheaths. Lonni Hanson, MD [facility]    Medications:    heparin  1,150 Units/hr (10/12/24 0507)    amLODipine   10 mg Oral Daily   atorvastatin  40 mg Oral Daily   calcium  acetate  1,334 mg Oral Once per day on Monday Wednesday Friday   Chlorhexidine  Gluconate Cloth  6 each Topical Daily   feeding supplement  237 mL Oral BID BM   hydrALAZINE   25 mg Oral TID   hydrOXYzine   25 mg Oral BID   metoprolol  tartrate  25 mg Oral BID  pantoprazole   40 mg Oral Daily   sodium chloride  flush  3 mL Intravenous Q12H   tamsulosin   0.4 mg Oral Daily   acetaminophen , alteplase , heparin , ondansetron  (ZOFRAN ) IV, sodium chloride  flush  Assessment/ Plan:  Mr. BRAYLN DUQUE is a 63 y.o.  male  Casey Franco is a 63 year old male with past medical conditions including hypertension, atrial fibrillation on Eliquis , hepatitis C with liver cirrhosis, and end-stage renal disease on hemodialysis.  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  Last treatment received on Monday.  No urgent indication for dialysis today.  Next treatment scheduled for Wednesday.  2.  Coronary artery disease, L main coronary artery  stenosis. Awaiting transfer to [facility] for CT surgery evaluation.   3. Anemia of chronic kidney disease Lab Results  Component Value Date   HGB 7.3 (L) 10/12/2024    Hgb 7.3, primary team has ordered blood transfusion. Will also order Retacrit  10000 units with dialysis.   4. Secondary Hyperparathyroidism: with outpatient labs: PTH 575, phosphorus 4.9, calcium  8.6 on 09/19/24.    Lab Results  Component Value Date   PTH 31 08/30/2018   CALCIUM  9.1 10/12/2024   CAION 1.18 10/11/2024   PHOS 4.2 12/23/2023    Awaiting updated phos level. Will continue to monitor bone minerals.    LOS: 1 Noemi Bellissimo 12/31/202512:05 PM   "

## 2024-10-12 NOTE — Progress Notes (Addendum)
 PHARMACY - ANTICOAGULATION CONSULT NOTE  Pharmacy Consult for Heparin  Indication: AF  Allergies[1]  Patient Measurements: Height: 6' (182.9 cm) Weight: 70.6 kg (155 lb 10.3 oz) IBW/kg (Calculated) : 77.6 HEPARIN  DW (KG): 64.9  Vital Signs: Temp: 97.7 F (36.5 C) (12/31 1014) Temp Source: Oral (12/31 1014) BP: 137/101 (12/31 1130) Pulse Rate: 77 (12/31 1130)  Labs: Recent Labs    10/11/24 0841 10/11/24 0842 10/11/24 1536 10/12/24 0230 10/12/24 1307  HGB 8.8* 8.2*  --  7.3*  --   HCT 26.0* 24.0*  --  22.9*  --   PLT  --   --   --  154  --   APTT  --   --  40*  --   --   HEPARINUNFRC  --   --  <0.10* <0.10* 0.15*  CREATININE  --   --   --  5.30*  --     Estimated Creatinine Clearance: 14.1 mL/min (A) (by C-G formula based on SCr of 5.3 mg/dL (H)).   Medical History: Past Medical History:  Diagnosis Date   AKI (acute kidney injury) 08/30/2018   Diabetes mellitus without complication (HCC)    no meds since weight loss   Dialysis patient    Mon, Wed, Fri   Hyperkalemia 02/04/2021   Hypertension    Metabolic acidosis, increased anion gap 02/04/2021   Renal disorder    Sepsis without acute organ dysfunction (HCC) 11/30/2023    Medications:  Medications Prior to Admission  Medication Sig Dispense Refill Last Dose/Taking   apixaban  (ELIQUIS ) 5 MG TABS tablet Take 1 tablet (5 mg total) by mouth 2 (two) times daily. 180 tablet 1 10/07/2024   Calcium  Acetate 667 MG TABS Take 1,334 mg by mouth 3 (three) times a week.   10/10/2024   hydrALAZINE  (APRESOLINE ) 25 MG tablet Take 25 mg by mouth 3 (three) times daily.   10/11/2024 Morning   hydrOXYzine  (ATARAX ) 25 MG tablet Take 25 mg by mouth 2 (two) times daily.   10/10/2024   irbesartan  (AVAPRO ) 300 MG tablet Take 1 tablet (300 mg total) by mouth daily. 90 tablet 0 Taking   Multiple Vitamin (MULTIVITAMIN ADULT PO) Take 1 tablet by mouth daily.   10/10/2024   omeprazole  (PRILOSEC) 20 MG capsule Take 20 mg by mouth every  morning.   10/10/2024   tamsulosin  (FLOMAX ) 0.4 MG CAPS capsule Take 0.4 mg by mouth daily.   10/10/2024   albumin  human 25 % bottle Inject 25 grams no matter what if more then 5L are removed then another 25 grams (Patient not taking: Reported on 09/30/2024)   Not Taking   amLODipine  (NORVASC ) 10 MG tablet Take 10 mg by mouth daily. (Patient not taking: Reported on 09/30/2024)   Not Taking   blood glucose meter kit and supplies KIT Dispense based on patient and insurance preference. Use up to four times daily as directed. (FOR ICD-9 250.00, 250.01). 1 each 0    epoetin  alfa-epbx (RETACRIT ) 10000 UNIT/ML injection Inject 1 mL (10,000 Units total) into the vein every Monday, Wednesday, and Friday with hemodialysis. (Patient not taking: Reported on 09/30/2024)   Not Taking   melatonin 5 MG TABS Take 1 tablet (5 mg total) by mouth at bedtime as needed. (Patient not taking: Reported on 09/30/2024) 20 tablet 0 Not Taking   metoprolol  tartrate (LOPRESSOR ) 25 MG tablet Take 25 mg by mouth 2 (two) times daily. (Patient not taking: Reported on 09/30/2024)   Not Taking    Assessment: 63 y/o  M with a h/o AF on Eliquis  recently increased to 5 mg bid due to stroke risk (CHA2DS2/VAS of 3) but previously 2.5 mg bid subtherapeutic dose for cirrhosis/ESRD increased bleeding risk. LHC today after abnormal Myoview . Pharmacy consulted for heparin  bridging beginning 8 hours post sheath removal.    12/31 PM: Patient was transfused in setting of low Hgb (ESRD) and plans for cardiac surgery, however no bleeding reported    Goal of Therapy:  Heparin  level 0.3-0.7 units/ml aPTT 66-102 seconds Monitor platelets by anticoagulation protocol: Yes   Plan:  12/31:  HL @ 1300 = 0.15 SUBtherapeutic - Heparin  2000 units IV bolus x 1 - Increase heparin  infusion to 1350 units/hr - recheck HL 8 hrs after rate change  - Daily HL/CBC   Bari Hamilton D 10/12/2024,1:36 PM        [1] No Known Allergies

## 2024-10-12 NOTE — Plan of Care (Signed)

## 2024-10-12 NOTE — Progress Notes (Signed)
 Pt receives outpt HD at [facility] MWF at 6:45am. Navigator following to assist with any HD needs.  Suzen Satchel Dialysis Navigator [phone]

## 2024-10-12 NOTE — Progress Notes (Signed)
 PHARMACY - ANTICOAGULATION CONSULT NOTE  Pharmacy Consult for Heparin  Indication: AF  Allergies[1]  Patient Measurements: Height: 6' (182.9 cm) Weight: 64.9 kg (143 lb 1.3 oz) IBW/kg (Calculated) : 77.6 HEPARIN  DW (KG): 64.9  Vital Signs: Temp: 98.4 F (36.9 C) (12/31 0400) Temp Source: Oral (12/30 2330) BP: 140/84 (12/31 0400) Pulse Rate: 79 (12/31 0400)  Labs: Recent Labs    10/11/24 0841 10/11/24 0842 10/11/24 1536 10/12/24 0230  HGB 8.8* 8.2*  --  7.3*  HCT 26.0* 24.0*  --  22.9*  PLT  --   --   --  154  APTT  --   --  40*  --   HEPARINUNFRC  --   --  <0.10* <0.10*  CREATININE  --   --   --  5.30*    Estimated Creatinine Clearance: 12.9 mL/min (A) (by C-G formula based on SCr of 5.3 mg/dL (H)).   Medical History: Past Medical History:  Diagnosis Date   AKI (acute kidney injury) 08/30/2018   Diabetes mellitus without complication (HCC)    no meds since weight loss   Dialysis patient    Mon, Wed, Fri   Hyperkalemia 02/04/2021   Hypertension    Metabolic acidosis, increased anion gap 02/04/2021   Renal disorder    Sepsis without acute organ dysfunction (HCC) 11/30/2023    Medications:  Medications Prior to Admission  Medication Sig Dispense Refill Last Dose/Taking   apixaban  (ELIQUIS ) 5 MG TABS tablet Take 1 tablet (5 mg total) by mouth 2 (two) times daily. 180 tablet 1 10/07/2024   Calcium  Acetate 667 MG TABS Take 1,334 mg by mouth 3 (three) times a week.   10/10/2024   hydrALAZINE  (APRESOLINE ) 25 MG tablet Take 25 mg by mouth 3 (three) times daily.   10/11/2024 Morning   hydrOXYzine  (ATARAX ) 25 MG tablet Take 25 mg by mouth 2 (two) times daily.   10/10/2024   irbesartan  (AVAPRO ) 300 MG tablet Take 1 tablet (300 mg total) by mouth daily. 90 tablet 0 Taking   Multiple Vitamin (MULTIVITAMIN ADULT PO) Take 1 tablet by mouth daily.   10/10/2024   omeprazole  (PRILOSEC) 20 MG capsule Take 20 mg by mouth every morning.   10/10/2024   tamsulosin  (FLOMAX ) 0.4  MG CAPS capsule Take 0.4 mg by mouth daily.   10/10/2024   albumin  human 25 % bottle Inject 25 grams no matter what if more then 5L are removed then another 25 grams (Patient not taking: Reported on 09/30/2024)   Not Taking   amLODipine  (NORVASC ) 10 MG tablet Take 10 mg by mouth daily. (Patient not taking: Reported on 09/30/2024)   Not Taking   blood glucose meter kit and supplies KIT Dispense based on patient and insurance preference. Use up to four times daily as directed. (FOR ICD-9 250.00, 250.01). 1 each 0    epoetin  alfa-epbx (RETACRIT ) 10000 UNIT/ML injection Inject 1 mL (10,000 Units total) into the vein every Monday, Wednesday, and Friday with hemodialysis. (Patient not taking: Reported on 09/30/2024)   Not Taking   melatonin 5 MG TABS Take 1 tablet (5 mg total) by mouth at bedtime as needed. (Patient not taking: Reported on 09/30/2024) 20 tablet 0 Not Taking   metoprolol  tartrate (LOPRESSOR ) 25 MG tablet Take 25 mg by mouth 2 (two) times daily. (Patient not taking: Reported on 09/30/2024)   Not Taking    Assessment: 63 y/o M with a h/o AF on Eliquis  recently increased to 5 mg bid due to stroke risk (CHA2DS2/VAS  of 3) but previously 2.5 mg bid subtherapeutic dose for cirrhosis/ESRD increased bleeding risk. LHC today after abnormal Myoview . Pharmacy consulted for heparin  bridging beginning 8 hours post sheath removal.      Goal of Therapy:  Heparin  level 0.3-0.7 units/ml aPTT 66-102 seconds Monitor platelets by anticoagulation protocol: Yes   Plan:  12/31:  HL @ 0230 = < 0.1, SUBtherapeutic - will order heparin  1950 units IV X 1 bolus and increase drip rate to 1150 units/hr - recheck HL 8 hrs after rate change  - Daily HL/CBC   Casey Franco D 10/12/2024,4:38 AM       [1] No Known Allergies

## 2024-10-12 NOTE — Progress Notes (Signed)
" °   10/12/24 1409  Vitals  Temp 97.8 F (36.6 C)  BP (!) 147/84  Pulse Rate 78  Resp 20  Weight 68.2 kg  Type of Weight Post-Dialysis  Oxygen Therapy  SpO2 100 %  O2 Device Room Air  Patient Activity (if Appropriate) In bed  Pulse Oximetry Type Continuous  Oximetry Probe Site Changed No  Post Treatment  Dialyzer Clearance Clear  Tolerated HD Treatment Yes  Post-Hemodialysis Comments Tx has been tolerated. Set goal has been achieved. VS have remained stable. Access dressing changed. Clean dry and intact. No verbalized concerns post tx. Unable to administer prbc. Blood unavailabe during HD. Type and Screen complete in the later half of the treatment  AVG/AVF Arterial Site Held (minutes) 0 minutes  AVG/AVF Venous Site Held (minutes) 0 minutes    "

## 2024-10-12 NOTE — Progress Notes (Signed)
 "  Rounding Note   Patient Name: Casey Franco Date of Encounter: 10/12/2024  [facility] Cardiologist: Evalene Lunger, MD   Subjective Patient reports feeling well without chest pain or shortness of breath.  Right and left heart catheterization yesterday revealed severe LMCA/LAD disease and severely elevated right and left heart filling pressures as well as pulmonary artery pressures.  He is scheduled for dialysis today for volume removal.  We will plan to transfer him to [facility] for cardiac surgery evaluation and advanced heart failure consultation.  Scheduled Meds:  atorvastatin  40 mg Oral Daily   calcium  acetate  1,334 mg Oral Once per day on Monday Wednesday Friday   Chlorhexidine  Gluconate Cloth  6 each Topical Daily   feeding supplement  237 mL Oral BID BM   hydrALAZINE   25 mg Oral TID   hydrOXYzine   25 mg Oral BID   pantoprazole   40 mg Oral Daily   sodium chloride  flush  3 mL Intravenous Q12H   tamsulosin   0.4 mg Oral Daily   Continuous Infusions:  heparin  1,150 Units/hr (10/12/24 0507)   PRN Meds: acetaminophen , alteplase , heparin , ondansetron  (ZOFRAN ) IV, sodium chloride  flush   Vital Signs  Vitals:   10/12/24 0837 10/12/24 1014 10/12/24 1030 10/12/24 1100  BP: 114/83 (!) 150/95 (!) 149/88 (!) 150/83  Pulse: 78 77 75 72  Resp: 18 19 13 14   Temp: 98.4 F (36.9 C) 97.7 F (36.5 C)    TempSrc: Oral Oral    SpO2: 100%  100% 99%  Weight:  70.6 kg    Height:       No intake or output data in the 24 hours ending 10/12/24 1116    10/12/2024   10:14 AM 10/12/2024    5:21 AM 10/11/2024    7:10 AM  Last 3 Weights  Weight (lbs) 155 lb 10.3 oz 156 lb 1.4 oz 143 lb 1.3 oz  Weight (kg) 70.6 kg 70.8 kg 64.9 kg      Telemetry Sinus rhythm- Personally Reviewed  Physical Exam  GEN: No acute distress.   Neck: No JVD Cardiac: RRR, no murmurs, rubs, or gallops.  Respiratory: Clear to auscultation bilaterally. GI: Soft, nontender, non-distended  MS:  No edema; No deformity. Neuro:  Nonfocal  Psych: Normal affect   Labs High Sensitivity Troponin:  No results for input(s): TROPONINIHS in the last 720 hours. No results for input(s): TRNPT in the last 720 hours.     Chemistry Recent Labs  Lab 10/11/24 0841 10/11/24 0842 10/11/24 1536 10/12/24 0230  NA 140 140  --  137  K 3.6 3.6  --  3.7  CL  --   --   --  101  CO2  --   --   --  23  GLUCOSE  --   --   --  98  BUN  --   --   --  46*  CREATININE  --   --   --  5.30*  CALCIUM   --   --   --  9.1  PROT  --   --  8.1  --   ALBUMIN   --   --  3.4*  --   AST  --   --  22  --   ALT  --   --  7  --   ALKPHOS  --   --  151*  --   BILITOT  --   --  0.6  --   GFRNONAA  --   --   --  11*  ANIONGAP  --   --   --  13    Lipids No results for input(s): CHOL, TRIG, HDL, LABVLDL, LDLCALC, CHOLHDL in the last 168 hours.  Hematology Recent Labs  Lab 10/11/24 0841 10/11/24 0842 10/11/24 1536 10/12/24 0230  WBC  --   --   --  4.9  RBC  --   --  2.80* 2.53*  HGB 8.8* 8.2*  --  7.3*  HCT 26.0* 24.0*  --  22.9*  MCV  --   --   --  90.5  MCH  --   --   --  28.9  MCHC  --   --   --  31.9  RDW  --   --   --  17.3*  PLT  --   --   --  154   Thyroid No results for input(s): TSH, FREET4 in the last 168 hours.  BNPNo results for input(s): BNP, PROBNP in the last 168 hours.  DDimer No results for input(s): DDIMER in the last 168 hours.   Radiology  DG Chest 1 View Result Date: 10/11/2024 IMPRESSION: 1. Cardiomegaly with vascular congestion and bilateral pleural effusions, right greater than left. 2. Slight globular appearance of the cardiopericardial silhouette compared to prior may be due to differences in technique and positioning. Recommend correlation with echocardiogram to exclude pericardial effusion. Electronically Signed   By: Andrea Gasman M.D.   On: 10/11/2024 16:42   Cardiac Studies  10/11/2024 R/LHC Severe LMCA and LAD disease with heavy  calcification of the coronary arteries, as detailed below. Severely elevated left heart, right heart, and pulmonary artery pressures.  Equalization of end-diastolic pressures noted, which can be seen with restrictive/constrictive physiology. Normal Fick cardiac output/index.   Patient Profile   63 y.o. male with a history of hypertension, ESRD on dialysis, prior alcohol use, hepatitis C, liver cirrhosis secondary to HCV, ascites, varices, HFrEF, and atrial fibrillation who presented for outpatient cardiac catheterization in the setting of abnormal stress testing 12/30 and was subsequently admitted for volume management, optimization of GDMT, and cardiac surgery/advanced heart failure consultation.  Assessment & Plan   Coronary artery disease - Patient presented for outpatient cardiac catheterization yesterday.  No chest pain but did report dyspnea with minimal activity. - LHC revealed severe LMCA and LAD disease with heavy calcification of the coronary arteries, detailed above - Continue aspirin  and high intensity statin therapy - Patient is pending transfer to [facility] to determine if CABG or high risk PCI would be suitable  Acute on chronic biventricular failure - Patient reports dyspnea with minimal activity and intermittent orthopnea - Cardiac catheterization revealed severely elevated right and left heart filling pressures as well as elevated pulmonary artery pressures - Volume status is managed with hemodialysis which is due for today.  Normally scheduled for Mondays, Wednesdays, and Fridays but may benefit from an additional session tomorrow. - He was transitioned from irbesartan  to Entresto yesterday as well as metoprolol  to carvedilol - Would likely benefit from advanced heart failure consultation once transferred to [facility]  Paroxysmal atrial fibrillation - Has been maintaining sinus rhythm during admission - Transitioned to carvedilol as above - Eliquis  held pending  consultation for CABG versus high risk PCI, continue heparin   ESRD - Nephrology following   For questions or updates, please contact [facility] Please consult [email or web address] for contact info under       Signed, Lesley LITTIE Maffucci, PA-C  10/12/2024, 11:16 AM    "

## 2024-10-12 NOTE — Discharge Summary (Signed)
 "  Physician Discharge Summary  Patient: Casey Franco FMW:[id number] DOB: [birth year]   Code Status: Full Code Admit date: 10/11/2024 Discharge date: 10/12/2024 Disposition: [facility] PCP: [facility]  Recommendations for Outpatient Follow-up:  Follow up with medicine service upon presentation to [facility].  consult nephrology routine HD, last session 12/31 included order for transfusion of 1u pRBC which was not completed  Consult cardiology/CVTS evaluation for CAD involving severe left main disease, as seen on LHC 12/30, considering surgical intervention  Discharge Diagnoses:  Principal Problem:   Acute on chronic heart failure with mildly reduced ejection fraction (HFmrEF) (HCC) Active Problems:   End-stage renal disease on hemodialysis (HCC)   Chronic heart failure with mildly reduced ejection fraction (HFmrEF) (HCC)   Positive cardiac stress test   Coronary artery disease involving native coronary artery of native heart without angina pectoris   Persistent atrial fibrillation (HCC)   ESRD (end stage renal disease) (HCC)   Abnormal stress test  Brief Hospital Course Summary: Casey Franco is a 63 y.o. male with a PMH significant for chronic HFrEF with LVEF 50% on most recent stress test, ESRD on HD MWF, HTN, HLD, PAF on Eliquis , hepatitis C and liver cirrhosis, presented with worsening of exertional dyspnea. Underwent stress test recently which showed a moderate size severe partially reversible defect involving the anterior wall apex and apical inferior segment consistent with scar and peri-infarct ischemia. LVEF 51%.  Presented to [facility] for elective LHC and RHC. cath found L main stenosis. Unable to transfer same day but [facility].  He is receiving HD today and have requested transfer. He is currently in HD, awaiting blood transfusion due to hgb <8 today and he is chest pain free.   Additionally:   ESRD on HD - Resume  routine HD schedule MWF. - Nephrology consulted   Recently diagnosed PAF - On heparin  drip, expect to switch back to Eliquis  eventually - Continue telemonitoring   Chronic normocytic anemia- hgb 7.3 today. Likely from chronic disease. No acute bleeding observed.  - 1u pRBC ordered - CBC am    Cirrhosis - Appears to be compensated - monitor LFT   Moderate protein calorie malnutrition -BMI= 19 - Start Ensure  Discharge Condition: Stable, improved Recommended discharge diet: Cardiac diet  Consultations: Cardiology Nephrology   Procedures/Studies: HD Cardiac cath 12/30 Echo   Allergies as of 10/12/2024   No Known Allergies      Medication List     PAUSE taking these medications    apixaban  5 MG Tabs tablet Wait to take this until your doctor or other care provider tells you to start again. Commonly known as: ELIQUIS  Take 1 tablet (5 mg total) by mouth 2 (two) times daily.       STOP taking these medications    albumin  human 25 % bottle   Calcium  Acetate 667 MG Tabs Replaced by: calcium  acetate 667 MG capsule   irbesartan  300 MG tablet Commonly known as: AVAPRO    melatonin 5 MG Tabs       TAKE these medications    amLODipine  10 MG tablet Commonly known as: NORVASC  Take 10 mg by mouth daily.   atorvastatin 40 MG tablet Commonly known as: LIPITOR Take 1 tablet (40 mg total) by mouth daily. Start taking on: October 13, 2024   blood glucose meter kit and supplies Kit Dispense based on patient and insurance preference. Use up to four times daily as directed. (FOR ICD-9  250.00, 250.01).   calcium  acetate 667 MG capsule Commonly known as: PHOSLO  Take 2 capsules (1,334 mg total) by mouth 3 (three) times a week. Start taking on: October 14, 2024 Replaces: Calcium  Acetate 667 MG Tabs   epoetin  alfa-epbx 10000 UNIT/ML injection Commonly known as: RETACRIT  Inject 1 mL (10,000 Units total) into the vein every Monday, Wednesday, and Friday at 6  PM. What changed: when to take this   feeding supplement Liqd Take 237 mLs by mouth 2 (two) times daily between meals.   heparin  25000 UT/250ML infusion Inject 1,350 Units/hr into the vein continuous.   hydrALAZINE  25 MG tablet Commonly known as: APRESOLINE  Take 25 mg by mouth 3 (three) times daily.   hydrOXYzine  25 MG tablet Commonly known as: ATARAX  Take 25 mg by mouth 2 (two) times daily.   metoprolol  tartrate 25 MG tablet Commonly known as: LOPRESSOR  Take 1 tablet (25 mg total) by mouth 2 (two) times daily.   MULTIVITAMIN ADULT PO Take 1 tablet by mouth daily.   omeprazole  20 MG capsule Commonly known as: PRILOSEC Take 20 mg by mouth every morning.   tamsulosin  0.4 MG Caps capsule Commonly known as: FLOMAX  Take 0.4 mg by mouth daily.       Subjective   Pt reports feeling well. Has no chest pain or SOB currently at rest. Has not been out of bed today. Agreeable to transfer.  All questions and concerns were addressed at time of discharge.  Objective  Blood pressure (!) 159/90, pulse 79, temperature 97.7 F (36.5 C), temperature source Oral, resp. rate 20, height 6' (1.829 m), weight 70.6 kg, SpO2 99%.   General: Pt is alert, awake, not in acute distress. Currently undergoing HD.  Cardiovascular: RRR, S1/S2 +, no rubs, no gallops Respiratory: CTA bilaterally, no wheezing, no rhonchi Abdominal: Soft, NT, ND, bowel sounds + Extremities: no edema, no cyanosis  The results of significant diagnostics from this hospitalization (including imaging, microbiology, ancillary and laboratory) are listed below for reference.   Imaging studies: DG Chest 1 View Result Date: 10/11/2024 CLINICAL DATA:  Congestive heart failure EXAM: DG CHEST 1V COMPARISON:  12/23/2023 FINDINGS: Right-sided dialysis catheter tip overlies the SVC. The heart is enlarged. Slight globular appearance of the cardiopericardial silhouette compared to prior may be due to differences in technique and  positioning. Small to moderate right and small left pleural effusion. Vascular congestion. No confluent opacity. No pneumothorax. IMPRESSION: 1. Cardiomegaly with vascular congestion and bilateral pleural effusions, right greater than left. 2. Slight globular appearance of the cardiopericardial silhouette compared to prior may be due to differences in technique and positioning. Recommend correlation with echocardiogram to exclude pericardial effusion. Electronically Signed   By: Andrea Gasman M.D.   On: 10/11/2024 16:42   CARDIAC CATHETERIZATION Result Date: 10/11/2024 Conclusions: Severe LMCA and LAD disease with heavy calcification of the coronary arteries, as detailed below. Severely elevated left heart, right heart, and pulmonary artery pressures.  Equalization of end-diastolic pressures noted, which can be seen with restrictive/constrictive physiology. Normal Fick cardiac output/index. Recommendation: Admit for fluid removal via hemodialysis and optimization of goal-directed medical therapy. Transfer to [facility] when bed available for cardiac surgery +/- advanced heart failure consultation. Aggressive secondary prevention of coronary artery disease. Initiate heparin  infusion in lieu of apixaban  8 hours after removal of femoral sheaths. Lonni Hanson, MD [facility]  LONG TERM MONITOR (3-14 DAYS) Result Date: 10/09/2024 Event monitor Patch Wear Time:  13 days and 19 hours (2025-11-22T14:19:20-0500 to 2025-12-06T09:44:05-499) Normal sinus rhythm  Patient had a min HR of 59 bpm, max HR of 179 bpm, and avg HR of 81 bpm. 5 Ventricular Tachycardia runs occurred, the run with the fastest interval lasting 9 beats with a max rate of 179 bpm, the longest lasting 15 beats with an avg rate of 156 bpm. 15 Supraventricular Tachycardia/atrial tachycardia runs occurred, the run with the fastest interval lasting 6 beats with a max rate of 145 bpm, the longest lasting 13 beats with an avg rate of 110 bpm.   Isolated SVEs were rare (<1.0%), SVE Couplets were rare (<1.0%), and SVE Triplets were rare (<1.0%). Isolated VEs were occasional (1.9%, 30487), VE Couplets were rare (<1.0%, 63), and VE Triplets were rare (<1.0%, 3). Ventricular Bigeminy and Trigeminy were present. No patient triggered events recorded Signed, Velinda Lunger, MD, Ph.D [facility]  NM Myocar Multi W/Spect Marisela Motion / EF Result Date: 09/20/2024   Abnormal pharmacologic myocardial perfusion stress test.   There is a moderate in size, severe, partially reversible defect involving the anterior wall, apex, and apical inferior segment consistent with scar and peri-infarct ischemia.   Left ventricular systolic function is low normal to mildly reduced (LVEF 51%) with anterior and apical hypokinesis.   Extensive coronary artery calcification is evident on the attenuation correction CT.   Moderate right pleural effusion is present with right lower lobe atelectasis versus consolidation.  Effusion appears larger in size compared to prior CT of the abdomen and pelvis from 01/21/2024.  Trivial left pleural effusion and abdominal ascites are also noted.   This is an intermediate risk study.    Labs: Basic Metabolic Panel: Recent Labs  Lab 10/11/24 0841 10/11/24 0842 10/12/24 0230 10/12/24 1307  NA 140 140 137  --   K 3.6 3.6 3.7  --   CL  --   --  101  --   CO2  --   --  23  --   GLUCOSE  --   --  98  --   BUN  --   --  46*  --   CREATININE  --   --  5.30*  --   CALCIUM   --   --  9.1  --   PHOS  --   --   --  2.1*   CBC: Recent Labs  Lab 10/11/24 0841 10/11/24 0842 10/12/24 0230  WBC  --   --  4.9  HGB 8.8* 8.2* 7.3*  HCT 26.0* 24.0* 22.9*  MCV  --   --  90.5  PLT  --   --  154   Microbiology: Results for orders placed or performed during the hospital encounter of 01/05/24  Acid Fast Culture with reflexed sensitivities     Status: None   Collection Time: 01/05/24 12:05 PM   Specimen: PATH Cytology Peritoneal fluid; Body  Fluid  Result Value Ref Range Status   Acid Fast Culture Negative  Final    Comment: (NOTE) No acid fast bacilli isolated after 6 weeks. Performed At: [facility] [address] [place], [place] [id number] Jennette Shorter MD Ey:[id number]    Source of Sample PERITONEAL  Final    Comment: Performed at [facility], [address] [place], [place] [address]  Acid Fast Smear (AFB)     Status: None   Collection Time: 01/05/24 12:05 PM   Specimen: PATH Cytology Peritoneal fluid; Body Fluid  Result Value Ref Range Status   AFB Specimen Processing Concentration  Final   Acid Fast Smear Negative  Final    Comment: (NOTE) Performed At: [facility] [address] [place], [place] [id number] Jennette Shorter MD Ey:[id number]    Source (AFB) PERITONEAL  Final    Comment: Performed at [facility], [address] [place], [place] [address]  Body fluid culture w Gram Stain     Status: None   Collection Time: 01/05/24 12:05 PM   Specimen: PATH Cytology Peritoneal fluid; Body Fluid  Result Value Ref Range Status   Specimen Description   Final    PERITONEAL Performed at [facility], [address], [place], [place] [address]    Special Requests   Final    NONE Performed at [facility], [address] [place], [place] [address]    Gram Stain   Final    NO WBC SEEN MODERATE GRAM POSITIVE COCCI IN PAIRS Performed at [facility], [address] [place], [place] [address]    Culture   Final    MIXED ANAEROBIC FLORA PRESENT.  CALL LAB IF FURTHER IID REQUIRED.   Report Status 01/07/2024 FINAL  Final    Time coordinating discharge: Over 30 minutes  [name], MD  [facility] 10/12/2024, 2:19 PM  "

## 2024-10-12 NOTE — Plan of Care (Signed)
" °  Problem: Education: Goal: Understanding of CV disease, CV risk reduction, and recovery process will improve Outcome: Progressing Goal: Individualized Educational Video(s) Outcome: Progressing   Problem: Activity: Goal: Ability to return to baseline activity level will improve Outcome: Progressing   Problem: Cardiovascular: Goal: Ability to achieve and maintain adequate cardiovascular perfusion will improve Outcome: Progressing Goal: Vascular access site(s) Level 0-1 will be maintained Outcome: Progressing   Problem: Education: Goal: Knowledge of General Education information will improve Description: Including pain rating scale, medication(s)/side effects and non-pharmacologic comfort measures Outcome: Progressing   Problem: Health Behavior/Discharge Planning: Goal: Ability to manage health-related needs will improve Outcome: Progressing   Problem: Clinical Measurements: Goal: Ability to maintain clinical measurements within normal limits will improve Outcome: Progressing Goal: Will remain free from infection Outcome: Progressing Goal: Diagnostic test results will improve Outcome: Progressing Goal: Respiratory complications will improve Outcome: Progressing Goal: Cardiovascular complication will be avoided Outcome: Progressing   "

## 2024-10-13 ENCOUNTER — Encounter (HOSPITAL_COMMUNITY): Payer: Self-pay | Admitting: Internal Medicine

## 2024-10-13 ENCOUNTER — Inpatient Hospital Stay (HOSPITAL_COMMUNITY)
Admission: EM | Admit: 2024-10-13 | Discharge: 2024-10-23 | DRG: 291 | Disposition: A | Discharge status: Home / Self Care | Source: Other Acute Inpatient Hospital | Attending: Internal Medicine | Admitting: Internal Medicine

## 2024-10-13 DIAGNOSIS — N186 End stage renal disease: Secondary | ICD-10-CM | POA: Diagnosis present

## 2024-10-13 DIAGNOSIS — D631 Anemia in chronic kidney disease: Secondary | ICD-10-CM | POA: Diagnosis present

## 2024-10-13 DIAGNOSIS — K766 Portal hypertension: Secondary | ICD-10-CM | POA: Diagnosis present

## 2024-10-13 DIAGNOSIS — Z7901 Long term (current) use of anticoagulants: Secondary | ICD-10-CM | POA: Diagnosis not present

## 2024-10-13 DIAGNOSIS — I480 Paroxysmal atrial fibrillation: Secondary | ICD-10-CM | POA: Diagnosis present

## 2024-10-13 DIAGNOSIS — I132 Hypertensive heart and chronic kidney disease with heart failure and with stage 5 chronic kidney disease, or end stage renal disease: Principal | ICD-10-CM | POA: Diagnosis present

## 2024-10-13 DIAGNOSIS — B1920 Unspecified viral hepatitis C without hepatic coma: Secondary | ICD-10-CM | POA: Diagnosis present

## 2024-10-13 DIAGNOSIS — Z87891 Personal history of nicotine dependence: Secondary | ICD-10-CM

## 2024-10-13 DIAGNOSIS — Z992 Dependence on renal dialysis: Secondary | ICD-10-CM

## 2024-10-13 DIAGNOSIS — D62 Acute posthemorrhagic anemia: Secondary | ICD-10-CM | POA: Diagnosis not present

## 2024-10-13 DIAGNOSIS — E1129 Type 2 diabetes mellitus with other diabetic kidney complication: Secondary | ICD-10-CM | POA: Diagnosis present

## 2024-10-13 DIAGNOSIS — I5082 Biventricular heart failure: Secondary | ICD-10-CM | POA: Diagnosis present

## 2024-10-13 DIAGNOSIS — I8511 Secondary esophageal varices with bleeding: Secondary | ICD-10-CM | POA: Diagnosis not present

## 2024-10-13 DIAGNOSIS — Z862 Personal history of diseases of the blood and blood-forming organs and certain disorders involving the immune mechanism: Secondary | ICD-10-CM | POA: Diagnosis not present

## 2024-10-13 DIAGNOSIS — K219 Gastro-esophageal reflux disease without esophagitis: Secondary | ICD-10-CM | POA: Diagnosis present

## 2024-10-13 DIAGNOSIS — I5023 Acute on chronic systolic (congestive) heart failure: Principal | ICD-10-CM | POA: Diagnosis present

## 2024-10-13 DIAGNOSIS — I2510 Atherosclerotic heart disease of native coronary artery without angina pectoris: Principal | ICD-10-CM | POA: Diagnosis present

## 2024-10-13 DIAGNOSIS — I10 Essential (primary) hypertension: Secondary | ICD-10-CM | POA: Diagnosis not present

## 2024-10-13 DIAGNOSIS — E1122 Type 2 diabetes mellitus with diabetic chronic kidney disease: Secondary | ICD-10-CM | POA: Diagnosis present

## 2024-10-13 DIAGNOSIS — N189 Chronic kidney disease, unspecified: Secondary | ICD-10-CM

## 2024-10-13 DIAGNOSIS — R188 Other ascites: Secondary | ICD-10-CM | POA: Diagnosis present

## 2024-10-13 DIAGNOSIS — Z833 Family history of diabetes mellitus: Secondary | ICD-10-CM

## 2024-10-13 DIAGNOSIS — N2581 Secondary hyperparathyroidism of renal origin: Secondary | ICD-10-CM | POA: Diagnosis present

## 2024-10-13 DIAGNOSIS — K920 Hematemesis: Secondary | ICD-10-CM | POA: Diagnosis not present

## 2024-10-13 DIAGNOSIS — I4891 Unspecified atrial fibrillation: Secondary | ICD-10-CM | POA: Diagnosis not present

## 2024-10-13 DIAGNOSIS — I110 Hypertensive heart disease with heart failure: Secondary | ICD-10-CM | POA: Diagnosis not present

## 2024-10-13 DIAGNOSIS — I5021 Acute systolic (congestive) heart failure: Secondary | ICD-10-CM | POA: Diagnosis not present

## 2024-10-13 DIAGNOSIS — I440 Atrioventricular block, first degree: Secondary | ICD-10-CM | POA: Diagnosis present

## 2024-10-13 DIAGNOSIS — Z961 Presence of intraocular lens: Secondary | ICD-10-CM | POA: Diagnosis present

## 2024-10-13 DIAGNOSIS — I509 Heart failure, unspecified: Secondary | ICD-10-CM | POA: Diagnosis not present

## 2024-10-13 DIAGNOSIS — D638 Anemia in other chronic diseases classified elsewhere: Secondary | ICD-10-CM | POA: Diagnosis not present

## 2024-10-13 DIAGNOSIS — I8500 Esophageal varices without bleeding: Secondary | ICD-10-CM | POA: Diagnosis not present

## 2024-10-13 DIAGNOSIS — K921 Melena: Secondary | ICD-10-CM | POA: Diagnosis present

## 2024-10-13 DIAGNOSIS — K7469 Other cirrhosis of liver: Secondary | ICD-10-CM | POA: Diagnosis present

## 2024-10-13 DIAGNOSIS — R933 Abnormal findings on diagnostic imaging of other parts of digestive tract: Secondary | ICD-10-CM | POA: Diagnosis not present

## 2024-10-13 DIAGNOSIS — R042 Hemoptysis: Secondary | ICD-10-CM | POA: Diagnosis not present

## 2024-10-13 DIAGNOSIS — K649 Unspecified hemorrhoids: Secondary | ICD-10-CM | POA: Diagnosis present

## 2024-10-13 DIAGNOSIS — K449 Diaphragmatic hernia without obstruction or gangrene: Secondary | ICD-10-CM | POA: Diagnosis present

## 2024-10-13 DIAGNOSIS — Z79899 Other long term (current) drug therapy: Secondary | ICD-10-CM

## 2024-10-13 DIAGNOSIS — E785 Hyperlipidemia, unspecified: Secondary | ICD-10-CM | POA: Diagnosis present

## 2024-10-13 DIAGNOSIS — E871 Hypo-osmolality and hyponatremia: Secondary | ICD-10-CM | POA: Diagnosis present

## 2024-10-13 DIAGNOSIS — E875 Hyperkalemia: Secondary | ICD-10-CM | POA: Diagnosis present

## 2024-10-13 DIAGNOSIS — Z23 Encounter for immunization: Secondary | ICD-10-CM

## 2024-10-13 DIAGNOSIS — K3189 Other diseases of stomach and duodenum: Secondary | ICD-10-CM | POA: Diagnosis present

## 2024-10-13 DIAGNOSIS — Z7982 Long term (current) use of aspirin: Secondary | ICD-10-CM

## 2024-10-13 DIAGNOSIS — I255 Ischemic cardiomyopathy: Secondary | ICD-10-CM | POA: Diagnosis present

## 2024-10-13 DIAGNOSIS — K7460 Unspecified cirrhosis of liver: Secondary | ICD-10-CM | POA: Diagnosis not present

## 2024-10-13 DIAGNOSIS — I4892 Unspecified atrial flutter: Secondary | ICD-10-CM | POA: Diagnosis not present

## 2024-10-13 DIAGNOSIS — Z860100 Personal history of colon polyps, unspecified: Secondary | ICD-10-CM

## 2024-10-13 HISTORY — DX: Paroxysmal atrial fibrillation: I48.0

## 2024-10-13 HISTORY — DX: Anemia in other chronic diseases classified elsewhere: D63.8

## 2024-10-13 HISTORY — DX: Chronic systolic (congestive) heart failure: I50.22

## 2024-10-13 HISTORY — DX: End stage renal disease: N18.6

## 2024-10-13 LAB — HEPARIN LEVEL (UNFRACTIONATED)
Heparin Unfractionated: 0.19 [IU]/mL — ABNORMAL LOW (ref 0.30–0.70)
Heparin Unfractionated: 0.33 [IU]/mL (ref 0.30–0.70)
Heparin Unfractionated: 0.11 [IU]/mL — ABNORMAL LOW (ref 0.30–0.70)

## 2024-10-13 LAB — CBC
HCT: 26.6 % — ABNORMAL LOW (ref 39.0–52.0)
Hemoglobin: 8.8 g/dL — ABNORMAL LOW (ref 13.0–17.0)
MCH: 29.6 pg (ref 26.0–34.0)
MCHC: 33.1 g/dL (ref 30.0–36.0)
MCV: 89.6 fL (ref 80.0–100.0)
Platelets: 161 K/uL (ref 150–400)
RBC: 2.97 MIL/uL — ABNORMAL LOW (ref 4.22–5.81)
RDW: 17.9 % — ABNORMAL HIGH (ref 11.5–15.5)
WBC: 6.4 K/uL (ref 4.0–10.5)
nRBC: 0 % (ref 0.0–0.2)

## 2024-10-13 LAB — BPAM RBC
Blood Product Expiration Date: 202601012359
ISSUE DATE / TIME: 202512311545
Unit Type and Rh: 9500

## 2024-10-13 LAB — TYPE AND SCREEN
ABO/RH(D): O POS
Antibody Screen: NEGATIVE
Unit division: 0

## 2024-10-13 LAB — LIPOPROTEIN A (LPA): Lipoprotein (a): 52.3 nmol/L — ABNORMAL HIGH

## 2024-10-13 MED ORDER — PANTOPRAZOLE SODIUM 40 MG PO TBEC
40.0000 mg | DELAYED_RELEASE_TABLET | Freq: Every day | ORAL | Status: DC
  Administered 2024-10-14 – 2024-10-17 (×4): 40 mg via ORAL
  Filled 2024-10-13 (×4): qty 1

## 2024-10-13 MED ORDER — METOPROLOL TARTRATE 25 MG PO TABS
25.0000 mg | ORAL_TABLET | Freq: Two times a day (BID) | ORAL | Status: DC
  Administered 2024-10-13 – 2024-10-14 (×2): 25 mg via ORAL
  Filled 2024-10-13 (×2): qty 1

## 2024-10-13 MED ORDER — ATORVASTATIN CALCIUM 40 MG PO TABS
40.0000 mg | ORAL_TABLET | Freq: Every day | ORAL | Status: DC
  Administered 2024-10-14 – 2024-10-23 (×10): 40 mg via ORAL
  Filled 2024-10-13 (×10): qty 1

## 2024-10-13 MED ORDER — MELATONIN 3 MG PO TABS
3.0000 mg | ORAL_TABLET | Freq: Every evening | ORAL | Status: DC | PRN
  Administered 2024-10-13: 3 mg via ORAL
  Filled 2024-10-13: qty 1

## 2024-10-13 MED ORDER — ALTEPLASE 2 MG IJ SOLR: 2.0000 mg | Freq: Once | INTRAMUSCULAR | Status: DC | PRN

## 2024-10-13 MED ORDER — CALCIUM ACETATE (PHOS BINDER) 667 MG PO CAPS: 667.0000 mg | ORAL_CAPSULE | ORAL | Status: DC

## 2024-10-13 MED ORDER — ACETAMINOPHEN 325 MG PO TABS
650.0000 mg | ORAL_TABLET | Freq: Four times a day (QID) | ORAL | Status: DC | PRN
  Administered 2024-10-17: 650 mg via ORAL
  Filled 2024-10-13: qty 2

## 2024-10-13 MED ORDER — HEPARIN (PORCINE) 25000 UT/250ML-% IV SOLN
2200.0000 [IU]/h | INTRAVENOUS | Status: AC
  Administered 2024-10-14: 1850 [IU]/h via INTRAVENOUS
  Administered 2024-10-14: 2000 [IU]/h via INTRAVENOUS
  Administered 2024-10-15 (×2): 2150 [IU]/h via INTRAVENOUS
  Administered 2024-10-16 – 2024-10-17 (×3): 2200 [IU]/h via INTRAVENOUS
  Filled 2024-10-13 (×8): qty 250

## 2024-10-13 MED ORDER — ONDANSETRON HCL 4 MG/2ML IJ SOLN: 4.0000 mg | Freq: Four times a day (QID) | INTRAMUSCULAR | Status: DC | PRN

## 2024-10-13 MED ORDER — SACUBITRIL-VALSARTAN 49-51 MG PO TABS
1.0000 | ORAL_TABLET | Freq: Two times a day (BID) | ORAL | Status: DC
  Administered 2024-10-14 – 2024-10-23 (×18): 1 via ORAL
  Filled 2024-10-13 (×18): qty 1

## 2024-10-13 MED ORDER — HEPARIN SODIUM (PORCINE) 1000 UNIT/ML DIALYSIS: 1000.0000 [IU] | INTRAMUSCULAR | Status: DC | PRN

## 2024-10-13 MED ORDER — HEPARIN BOLUS VIA INFUSION
1950.0000 [IU] | Freq: Once | INTRAVENOUS | Status: AC
  Administered 2024-10-13: 1950 [IU] via INTRAVENOUS
  Filled 2024-10-13: qty 1950

## 2024-10-13 MED ORDER — ACETAMINOPHEN 650 MG RE SUPP: 650.0000 mg | Freq: Four times a day (QID) | RECTAL | Status: DC | PRN

## 2024-10-13 MED ORDER — SACUBITRIL-VALSARTAN 49-51 MG PO TABS
1.0000 | ORAL_TABLET | Freq: Two times a day (BID) | ORAL | Status: DC
  Administered 2024-10-13: 1 via ORAL
  Filled 2024-10-13: qty 1

## 2024-10-13 MED ORDER — LOSARTAN POTASSIUM 25 MG PO TABS: 25.0000 mg | ORAL_TABLET | Freq: Every day | ORAL | Status: DC

## 2024-10-13 MED ORDER — HYDRALAZINE HCL 25 MG PO TABS
25.0000 mg | ORAL_TABLET | Freq: Three times a day (TID) | ORAL | Status: DC
  Administered 2024-10-13 – 2024-10-14 (×2): 25 mg via ORAL
  Filled 2024-10-13 (×2): qty 1

## 2024-10-13 MED ORDER — SACUBITRIL-VALSARTAN 49-51 MG PO TABS
1.0000 | ORAL_TABLET | Freq: Two times a day (BID) | ORAL | 3 refills | Status: DC
  Filled 2024-10-13: qty 60, 30d supply, fill #0

## 2024-10-13 NOTE — Progress Notes (Signed)
 PHARMACY - ANTICOAGULATION CONSULT NOTE  Pharmacy Consult for Heparin  Indication: AF  Allergies[1]  Patient Measurements: Height: 6' (182.9 cm) Weight: 68.2 kg (150 lb 5.7 oz) IBW/kg (Calculated) : 77.6 HEPARIN  DW (KG): 64.9  Vital Signs: Temp: 98.4 F (36.9 C) (01/01 0002) Temp Source: Oral (12/31 1910) BP: 114/78 (01/01 0002) Pulse Rate: 77 (01/01 0002)  Labs: Recent Labs    10/11/24 0841 10/11/24 0842 10/11/24 1536 10/11/24 1536 10/12/24 0230 10/12/24 1307 10/13/24 0004  HGB 8.8* 8.2*  --   --  7.3*  --   --   HCT 26.0* 24.0*  --   --  22.9*  --   --   PLT  --   --   --   --  154  --   --   APTT  --   --  40*  --   --   --   --   HEPARINUNFRC  --   --  <0.10*   < > <0.10* 0.15* 0.19*  CREATININE  --   --   --   --  5.30*  --   --    < > = values in this interval not displayed.    Estimated Creatinine Clearance: 13.6 mL/min (A) (by C-G formula based on SCr of 5.3 mg/dL (H)).   Medical History: Past Medical History:  Diagnosis Date   AKI (acute kidney injury) 08/30/2018   Diabetes mellitus without complication (HCC)    no meds since weight loss   Dialysis patient    Mon, Wed, Fri   Hyperkalemia 02/04/2021   Hypertension    Metabolic acidosis, increased anion gap 02/04/2021   Renal disorder    Sepsis without acute organ dysfunction (HCC) 11/30/2023    Medications:  Medications Prior to Admission  Medication Sig Dispense Refill Last Dose/Taking   [Paused] apixaban  (ELIQUIS ) 5 MG TABS tablet Take 1 tablet (5 mg total) by mouth 2 (two) times daily. 180 tablet 1 10/07/2024   Calcium  Acetate 667 MG TABS Take 1,334 mg by mouth 3 (three) times a week.   10/10/2024   hydrALAZINE  (APRESOLINE ) 25 MG tablet Take 25 mg by mouth 3 (three) times daily.   10/11/2024 Morning   hydrOXYzine  (ATARAX ) 25 MG tablet Take 25 mg by mouth 2 (two) times daily.   10/10/2024   irbesartan  (AVAPRO ) 300 MG tablet Take 1 tablet (300 mg total) by mouth daily. 90 tablet 0 Taking    Multiple Vitamin (MULTIVITAMIN ADULT PO) Take 1 tablet by mouth daily.   10/10/2024   omeprazole  (PRILOSEC) 20 MG capsule Take 20 mg by mouth every morning.   10/10/2024   tamsulosin  (FLOMAX ) 0.4 MG CAPS capsule Take 0.4 mg by mouth daily.   10/10/2024   albumin  human 25 % bottle Inject 25 grams no matter what if more then 5L are removed then another 25 grams (Patient not taking: Reported on 09/30/2024)   Not Taking   amLODipine  (NORVASC ) 10 MG tablet Take 10 mg by mouth daily. (Patient not taking: Reported on 09/30/2024)   Not Taking   blood glucose meter kit and supplies KIT Dispense based on patient and insurance preference. Use up to four times daily as directed. (FOR ICD-9 250.00, 250.01). 1 each 0    epoetin  alfa-epbx (RETACRIT ) 10000 UNIT/ML injection Inject 1 mL (10,000 Units total) into the vein every Monday, Wednesday, and Friday with hemodialysis. (Patient not taking: Reported on 09/30/2024)   Not Taking   melatonin 5 MG TABS Take 1 tablet (5 mg  total) by mouth at bedtime as needed. (Patient not taking: Reported on 09/30/2024) 20 tablet 0 Not Taking   metoprolol  tartrate (LOPRESSOR ) 25 MG tablet Take 25 mg by mouth 2 (two) times daily. (Patient not taking: Reported on 09/30/2024)   Not Taking    Assessment: 64 y/o M with a h/o AF on Eliquis  recently increased to 5 mg bid due to stroke risk (CHA2DS2/VAS of 3) but previously 2.5 mg bid subtherapeutic dose for cirrhosis/ESRD increased bleeding risk. LHC today after abnormal Myoview . Pharmacy consulted for heparin  bridging beginning 8 hours post sheath removal.    12/31 PM: Patient was transfused in setting of low Hgb (ESRD) and plans for cardiac surgery, however no bleeding reported    Goal of Therapy:  Heparin  level 0.3-0.7 units/ml aPTT 66-102 seconds Monitor platelets by anticoagulation protocol: Yes   Plan:  01/01:  HL @ 0004 = 0.19, SUBtherapeutic - Will order heparin  1950 units IV X 1 bolus and increase drip rate to 1600 units/hr   - recheck HL 8 hrs after rate change  - Daily HL/CBC   Joeanna Howdyshell D 10/13/2024,1:32 AM         [1] No Known Allergies

## 2024-10-13 NOTE — Progress Notes (Signed)
 PHARMACY - ANTICOAGULATION CONSULT NOTE  Pharmacy Consult for Heparin  Indication: AF  Allergies[1]  Patient Measurements: Height: 6' (182.9 cm) Weight: 68.2 kg (150 lb 5.7 oz) IBW/kg (Calculated) : 77.6 HEPARIN  DW (KG): 64.9  Vital Signs: Temp: 98.4 F (36.9 C) (01/01 0812) Temp Source: Oral (01/01 0812) BP: 112/75 (01/01 0812) Pulse Rate: 71 (01/01 0812)  Labs: Recent Labs    10/11/24 0842 10/11/24 1536 10/11/24 1536 10/12/24 0230 10/12/24 1307 10/13/24 0004 10/13/24 0348 10/13/24 1019  HGB 8.2*  --   --  7.3*  --   --  8.8*  --   HCT 24.0*  --   --  22.9*  --   --  26.6*  --   PLT  --   --   --  154  --   --  161  --   APTT  --  40*  --   --   --   --   --   --   HEPARINUNFRC  --  <0.10*   < > <0.10* 0.15* 0.19*  --  0.33  CREATININE  --   --   --  5.30*  --   --   --   --    < > = values in this interval not displayed.    Estimated Creatinine Clearance: 13.6 mL/min (A) (by C-G formula based on SCr of 5.3 mg/dL (H)).   Medical History: Past Medical History:  Diagnosis Date   AKI (acute kidney injury) 08/30/2018   Diabetes mellitus without complication (HCC)    no meds since weight loss   Dialysis patient    Mon, Wed, Fri   Hyperkalemia 02/04/2021   Hypertension    Metabolic acidosis, increased anion gap 02/04/2021   Renal disorder    Sepsis without acute organ dysfunction (HCC) 11/30/2023    Medications:  Medications Prior to Admission  Medication Sig Dispense Refill Last Dose/Taking   [Paused] apixaban  (ELIQUIS ) 5 MG TABS tablet Take 1 tablet (5 mg total) by mouth 2 (two) times daily. 180 tablet 1 10/07/2024   Calcium  Acetate 667 MG TABS Take 1,334 mg by mouth 3 (three) times a week.   10/10/2024   hydrALAZINE  (APRESOLINE ) 25 MG tablet Take 25 mg by mouth 3 (three) times daily.   10/11/2024 Morning   hydrOXYzine  (ATARAX ) 25 MG tablet Take 25 mg by mouth 2 (two) times daily.   10/10/2024   irbesartan  (AVAPRO ) 300 MG tablet Take 1 tablet (300 mg  total) by mouth daily. 90 tablet 0 Taking   Multiple Vitamin (MULTIVITAMIN ADULT PO) Take 1 tablet by mouth daily.   10/10/2024   omeprazole  (PRILOSEC) 20 MG capsule Take 20 mg by mouth every morning.   10/10/2024   tamsulosin  (FLOMAX ) 0.4 MG CAPS capsule Take 0.4 mg by mouth daily.   10/10/2024   albumin  human 25 % bottle Inject 25 grams no matter what if more then 5L are removed then another 25 grams (Patient not taking: Reported on 09/30/2024)   Not Taking   amLODipine  (NORVASC ) 10 MG tablet Take 10 mg by mouth daily. (Patient not taking: Reported on 09/30/2024)   Not Taking   blood glucose meter kit and supplies KIT Dispense based on patient and insurance preference. Use up to four times daily as directed. (FOR ICD-9 250.00, 250.01). 1 each 0    epoetin  alfa-epbx (RETACRIT ) 10000 UNIT/ML injection Inject 1 mL (10,000 Units total) into the vein every Monday, Wednesday, and Friday with hemodialysis. (Patient not taking: Reported on  09/30/2024)   Not Taking   melatonin 5 MG TABS Take 1 tablet (5 mg total) by mouth at bedtime as needed. (Patient not taking: Reported on 09/30/2024) 20 tablet 0 Not Taking   metoprolol  tartrate (LOPRESSOR ) 25 MG tablet Take 25 mg by mouth 2 (two) times daily. (Patient not taking: Reported on 09/30/2024)   Not Taking    Assessment: 64 y/o M with a h/o AF on Eliquis  recently increased to 5 mg bid due to stroke risk (CHA2DS2/VAS of 3) but previously 2.5 mg bid subtherapeutic dose for cirrhosis/ESRD increased bleeding risk. LHC today after abnormal Myoview . Pharmacy consulted for heparin  bridging beginning 8 hours post sheath removal.    12/31 PM: Patient was transfused in setting of low Hgb (ESRD) and plans for cardiac surgery, however no bleeding reported.   1/1 1019 HL 0.33     Goal of Therapy:  Heparin  level 0.3-0.7 units/ml aPTT 66-102 seconds Monitor platelets by anticoagulation protocol: Yes   Plan:  Heparin  level is therapeutic. Will increase heparin   infusion to 1650 units/hr. Recheck heparin  level in 8 hours. CBC daily while on heparin .   Cathaleen GORMAN Blanch, PharmD, BCPS 10/13/2024,11:02 AM          [1] No Known Allergies

## 2024-10-13 NOTE — Progress Notes (Signed)
 "  Rounding Note   Patient Name: Casey Franco Date of Encounter: 10/13/2024  [facility] Cardiologist: Timothy Gollan, MD   Subjective Patient seen on AM rounds. Denies any chest pain or current shortness of breath. States that he has been able to move about the room without assistance. Pending transfer to [facility].   Scheduled Meds:  atorvastatin  40 mg Oral Daily   calcium  acetate  1,334 mg Oral Once per day on Monday Wednesday Friday   Chlorhexidine  Gluconate Cloth  6 each Topical Daily   epoetin  alfa-epbx (RETACRIT ) injection  10,000 Units Intravenous Q M,W,F-1800   feeding supplement  237 mL Oral BID BM   hydrALAZINE   25 mg Oral TID   hydrOXYzine   25 mg Oral BID   losartan  25 mg Oral Daily   metoprolol  tartrate  25 mg Oral BID   pantoprazole   40 mg Oral Daily   sodium chloride  flush  3 mL Intravenous Q12H   tamsulosin   0.4 mg Oral Daily   Continuous Infusions:  heparin  1,600 Units/hr (10/13/24 0424)   PRN Meds: acetaminophen , alteplase , heparin , ondansetron  (ZOFRAN ) IV, sodium chloride  flush   Vital Signs  Vitals:   10/12/24 2139 10/13/24 0002 10/13/24 0002 10/13/24 0430  BP: 122/81 114/78 114/78 106/81  Pulse: 79 77 77 71  Resp: 19 19 19 20   Temp: 98.3 F (36.8 C) 98.4 F (36.9 C) 98.4 F (36.9 C) 98.2 F (36.8 C)  TempSrc:      SpO2: 99% 98% 98% 98%  Weight:      Height:        Intake/Output Summary (Last 24 hours) at 10/13/2024 0811 Last data filed at 10/13/2024 0424 Gross per 24 hour  Intake 1509.15 ml  Output 2000 ml  Net -490.85 ml      10/12/2024    2:09 PM 10/12/2024   10:14 AM 10/12/2024    5:21 AM  Last 3 Weights  Weight (lbs) 150 lb 5.7 oz 155 lb 10.3 oz 156 lb 1.4 oz  Weight (kg) 68.2 kg 70.6 kg 70.8 kg      Telemetry Sinus with occasional unifocal PVC's - Personally Reviewed  ECG  No new tracings - Personally Reviewed  Physical Exam  GEN: No acute distress.   Neck: no JVD Cardiac: RRR, no murmurs, rubs,  or gallops.  Respiratory: Clear to auscultation bilaterally. GI: Soft, nontender, non-distended  MS: No edema; No deformity. Neuro:  Nonfocal  Psych: Normal affect   Labs High Sensitivity Troponin:  No results for input(s): TROPONINIHS in the last 720 hours. No results for input(s): TRNPT in the last 720 hours.     Chemistry Recent Labs  Lab 10/11/24 0841 10/11/24 0842 10/11/24 1536 10/12/24 0230  NA 140 140  --  137  K 3.6 3.6  --  3.7  CL  --   --   --  101  CO2  --   --   --  23  GLUCOSE  --   --   --  98  BUN  --   --   --  46*  CREATININE  --   --   --  5.30*  CALCIUM   --   --   --  9.1  PROT  --   --  8.1  --   ALBUMIN   --   --  3.4*  --   AST  --   --  22  --   ALT  --   --  7  --   ALKPHOS  --   --  151*  --   BILITOT  --   --  0.6  --   GFRNONAA  --   --   --  11*  ANIONGAP  --   --   --  13    Lipids No results for input(s): CHOL, TRIG, HDL, LABVLDL, LDLCALC, CHOLHDL in the last 168 hours.  Hematology Recent Labs  Lab 10/11/24 0842 10/11/24 1536 10/12/24 0230 10/13/24 0348  WBC  --   --  4.9 6.4  RBC  --  2.80* 2.53* 2.97*  HGB 8.2*  --  7.3* 8.8*  HCT 24.0*  --  22.9* 26.6*  MCV  --   --  90.5 89.6  MCH  --   --  28.9 29.6  MCHC  --   --  31.9 33.1  RDW  --   --  17.3* 17.9*  PLT  --   --  154 161   Thyroid No results for input(s): TSH, FREET4 in the last 168 hours.  BNPNo results for input(s): BNP, PROBNP in the last 168 hours.  DDimer No results for input(s): DDIMER in the last 168 hours.   Radiology  DG Chest 1 View Result Date: 10/11/2024 CLINICAL DATA:  Congestive heart failure EXAM: DG CHEST 1V COMPARISON:  12/23/2023 FINDINGS: Right-sided dialysis catheter tip overlies the SVC. The heart is enlarged. Slight globular appearance of the cardiopericardial silhouette compared to prior may be due to differences in technique and positioning. Small to moderate right and small left pleural effusion. Vascular congestion. No  confluent opacity. No pneumothorax. IMPRESSION: 1. Cardiomegaly with vascular congestion and bilateral pleural effusions, right greater than left. 2. Slight globular appearance of the cardiopericardial silhouette compared to prior may be due to differences in technique and positioning. Recommend correlation with echocardiogram to exclude pericardial effusion. Electronically Signed   By: Andrea Gasman M.D.   On: 10/11/2024 16:42   CARDIAC CATHETERIZATION Result Date: 10/11/2024 Conclusions: Severe LMCA and LAD disease with heavy calcification of the coronary arteries, as detailed below. Severely elevated left heart, right heart, and pulmonary artery pressures.  Equalization of end-diastolic pressures noted, which can be seen with restrictive/constrictive physiology. Normal Fick cardiac output/index. Recommendation: Admit for fluid removal via hemodialysis and optimization of goal-directed medical therapy. Transfer to [facility] when bed available for cardiac surgery +/- advanced heart failure consultation. Aggressive secondary prevention of coronary artery disease. Initiate heparin  infusion in lieu of apixaban  8 hours after removal of femoral sheaths. Lonni Hanson, MD [facility]   Cardiac Studies 10/11/2024 R/LHC Severe LMCA and LAD disease with heavy calcification of the coronary arteries, as detailed below. Severely elevated left heart, right heart, and pulmonary artery pressures.  Equalization of end-diastolic pressures noted, which can be seen with restrictive/constrictive physiology. Normal Fick cardiac output/index.   Patient Profile   64 y.o. 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  Assessment & Plan  Coronary artery disease -Patient presented for outpatient cardiac catheterization with no chest pain but reported dyspnea with minimal activity -Continues to remain chest pain-free -Left heart catheterization revealed severe LMCA and LAD disease with heavy calcification  of the coronary arteries, detailed above -Continued on aspirin  and high intensity statin therapy -Patient pending transfer to [facility] for CABG versus high risk PCI -EKG as needed for pain no changes  Acute on chronic biventricular failure -Previously reported dyspnea with minimal activity and intermittent orthopnea -EF 35 to 40% in 07/2024 -Cardiac catheterization revealed severely elevated right and left heart filling pressures as well as elevated pulmonary artery pressures -Volume status is managed by hemodialysis with ongoing recommendation by nephrology -Patient continues to appear to be volume up today and would benefit from dialysis today if possible -Started on Entresto 49/51 mg twice daily -Continued on metoprolol  titrate 25 mg twice daily will consider transitioning to succinate tomorrow -Daily weights and I's and O's  Paroxysmal atrial fibrillation - Currently maintaining sinus rhythm on telemetry in the 70s with 3 beat run SVT overnight -Apixaban  held pending consultation for CABG versus high risk PCI -Continued on heparin  infusion -Continued on telemetry monitoring  End-stage renal disease -Continue to be followed by neurology  5.   Anemia of chronic disease  -with a hemoglobin 8.8 after being transfused 1 unit of PRBCs  -Daily CBC -Recommend transfusion for hemoglobin 8 or less  For questions or updates, please contact [facility] Please consult [email or web address] for contact info under       Signed, Jaelen Gellerman, NP  10/13/2024, 8:11 AM    "

## 2024-10-13 NOTE — Plan of Care (Signed)
  Problem: Clinical Measurements: Goal: Ability to maintain clinical measurements within normal limits will improve Outcome: Progressing Goal: Cardiovascular complication will be avoided Outcome: Progressing   Problem: Nutrition: Goal: Adequate nutrition will be maintained Outcome: Progressing   Problem: Elimination: Goal: Will not experience complications related to bowel motility Outcome: Progressing

## 2024-10-13 NOTE — Progress Notes (Signed)
 " [facility]  ROUNDING NOTE   Subjective:   Casey Franco is a 64 year old male with past medical conditions including hypertension, atrial fibrillation on Eliquis , hepatitis C with liver cirrhosis, and end-stage renal disease on hemodialysis.  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 Dennise.    Patient resting in bed Alert and oriented Upset that only 2L was removed during dialysis yesterday Refused additional treatment today    Objective:  Vital signs in last 24 hours:  Temp:  [97.5 F (36.4 C)-98.4 F (36.9 C)] 98.1 F (36.7 C) (01/01 1122) Pulse Rate:  [71-79] 74 (01/01 1122) Resp:  [16-20] 16 (01/01 1122) BP: (106-159)/(67-90) 115/67 (01/01 1122) SpO2:  [97 %-100 %] 97 % (01/01 1122) Weight:  [68.2 kg] 68.2 kg (12/31 1409)  Weight change: 5.7 kg Filed Weights   10/12/24 0521 10/12/24 1014 10/12/24 1409  Weight: 70.8 kg 70.6 kg 68.2 kg    Intake/Output: I/O last 3 completed shifts: In: 1509.2 [P.O.:720; I.V.:395.2; Blood:394] Out: 2000 [Other:2000]   Intake/Output this shift:  Total I/O In: 240 [P.O.:240] Out: -   Physical Exam: General: NAD  Head: Normocephalic, atraumatic. Moist oral mucosal membranes, JVD  Eyes: Anicteric  Lungs:  Clear to auscultation normal effort  Heart: Regular rate and rhythm  Abdomen:  Soft, nontender  Extremities: Trace peripheral edema.  Neurologic: Awake, alert, conversant  Skin: Warm,dry, no rash  Access: Lt Permcath    Basic Metabolic Panel: Recent Labs  Lab 10/11/24 0841 10/11/24 0842 10/12/24 0230 10/12/24 1307  NA 140 140 137  --   K 3.6 3.6 3.7  --   CL  --   --  101  --   CO2  --    --  23  --   GLUCOSE  --   --  98  --   BUN  --   --  46*  --   CREATININE  --   --  5.30*  --   CALCIUM   --   --  9.1  --   PHOS  --   --   --  2.1*    Liver Function Tests: Recent Labs  Lab 10/11/24 1536  AST 22  ALT 7  ALKPHOS 151*  BILITOT 0.6  PROT 8.1  ALBUMIN  3.4*   No results for input(s): LIPASE, AMYLASE in the last 168 hours. No results for input(s): AMMONIA in the last 168 hours.  CBC: Recent Labs  Lab 10/11/24 0841 10/11/24 0842 10/12/24 0230 10/13/24 0348  WBC  --   --  4.9 6.4  HGB 8.8* 8.2* 7.3* 8.8*  HCT 26.0* 24.0* 22.9* 26.6*  MCV  --   --  90.5 89.6  PLT  --   --  154 161    Cardiac Enzymes: No results for input(s): CKTOTAL, CKMB, CKMBINDEX, TROPONINI in the last 168 hours.  BNP: Invalid input(s): POCBNP  CBG: No results for input(s): GLUCAP in the last 168 hours.  Microbiology: Results for orders placed or performed during the hospital encounter of 01/05/24  Acid Fast Culture with reflexed sensitivities     Status: None   Collection Time: 01/05/24 12:05 PM   Specimen: PATH Cytology Peritoneal fluid; Body Fluid  Result Value Ref  Range Status   Acid Fast Culture Negative  Final    Comment: (NOTE) No acid fast bacilli isolated after 6 weeks. Performed At: [facility] [address] [place], [place] [id number] Jennette Shorter MD Ey:[id number]    Source of Sample PERITONEAL  Final    Comment: Performed at [facility], [address] [place], [place] [address]  Acid Fast Smear (AFB)     Status: None   Collection Time: 01/05/24 12:05 PM   Specimen: PATH Cytology Peritoneal fluid; Body Fluid  Result Value Ref Range Status   AFB Specimen Processing Concentration  Final   Acid Fast Smear Negative  Final    Comment: (NOTE) Performed At: [facility] [address] [place], [place] [id number] Jennette Shorter MD Ey:[id number]    Source (AFB) PERITONEAL  Final    Comment: Performed at [facility], [address] [place], [place] [address]  Body fluid culture w Gram Stain     Status: None   Collection Time: 01/05/24 12:05 PM   Specimen: PATH Cytology Peritoneal fluid; Body Fluid  Result Value Ref Range Status   Specimen Description   Final    PERITONEAL Performed at [facility], [address], [place], [place] [address]    Special Requests   Final    NONE Performed at  [facility], [address] [place], [place] [address]    Gram Stain   Final    NO WBC SEEN MODERATE GRAM POSITIVE COCCI IN PAIRS Performed at [facility], [address], [place], [place] [address]    Culture   Final    MIXED ANAEROBIC FLORA PRESENT.  CALL LAB IF FURTHER IID REQUIRED.   Report Status 01/07/2024 FINAL  Final    Coagulation Studies: No results for input(s): LABPROT, INR in the last 72 hours.  Urinalysis: No results for input(s): COLORURINE, LABSPEC, PHURINE, GLUCOSEU, HGBUR, BILIRUBINUR, KETONESUR, PROTEINUR, UROBILINOGEN, NITRITE, LEUKOCYTESUR in the last 72 hours.  Invalid input(s): APPERANCEUR    Imaging: No results found.    Medications:    heparin  1,650 Units/hr (10/13/24 1115)    atorvastatin  40 mg Oral Daily   calcium  acetate  1,334 mg Oral Once per day on Monday Wednesday Friday   Chlorhexidine  Gluconate Cloth  6 each Topical Daily   epoetin  alfa-epbx (RETACRIT ) injection  10,000 Units Intravenous Q M,W,F-1800   feeding supplement  237 mL Oral BID BM   hydrALAZINE   25 mg Oral TID   hydrOXYzine   25 mg Oral BID   metoprolol  tartrate  25 mg Oral BID   pantoprazole   40 mg Oral Daily   sacubitril-valsartan  1 tablet Oral BID   sodium chloride  flush  3 mL Intravenous Q12H   tamsulosin   0.4 mg Oral Daily   acetaminophen , alteplase , heparin , ondansetron  (ZOFRAN ) IV, sodium chloride  flush  Assessment/ Plan:  Mr. Casey Franco is a 64 y.o.  male  Casey Franco is a 64 year old male with past  medical conditions including hypertension, atrial fibrillation on Eliquis , hepatitis C with liver cirrhosis, and end-stage renal disease on hemodialysis.  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  Received treatment yesterday, UF 2L achieved. Offered UF only treatment today and patient refused. Will plan for next treatment tomorrow and will  get patient to dry weight, 65kg.    2.  Coronary artery disease, L main coronary artery stenosis. Awaiting transfer to [facility] for CT surgery evaluation. Will perform aggressive dialysis for fluid removal.   3. Anemia of chronic kidney disease Lab Results  Component Value Date   HGB 8.8 (L) 10/13/2024    Hgb 8.3, improved with blood transfusion. Continue Retacrit  10000 units with dialysis.   4. Secondary Hyperparathyroidism: with outpatient labs: PTH 575, phosphorus 4.9, calcium  8.6 on 09/19/24.    Lab Results  Component Value Date   PTH 31 08/30/2018   CALCIUM  9.1 10/12/2024   CAION 1.18 10/11/2024   PHOS 2.1 (L) 10/12/2024    Phos level decreased, will decrease calcium  acetate with meals.    LOS: 2 Casey Franco 1/1/202611:42 AM   "

## 2024-10-13 NOTE — Progress Notes (Signed)
" °  INTERVAL PROGRESS NOTE  Casey Franco- 64 y.o. male  LOS: 2 __________________________________________________________________  SUBJECTIVE: Discharged yesterday and awaiting transfer to [facility] for further cards eval and consideration of cabg. Remains stable.   OBJECTIVE: Blood pressure 126/86, pulse 74, temperature 98.1 F (36.7 C), temperature source Oral, resp. rate 16, height 6' (1.829 m), weight 68.2 kg, SpO2 97%.  General: NAD, pleasant, able to participate in exam Cardiac: RRR, normal heart sounds, no murmurs. 2+ radial and PT pulses bilaterally Respiratory: CTAB, normal effort, No wheezes, rales or rhonchi Abdomen: soft, nontender, nondistended, no hepatic or splenomegaly, +BS Extremities: no edema. WWP. Skin: warm and dry, no rashes noted Neuro: alert and oriented, no focal deficits Psych: Normal affect and mood   ASSESSMENT/PLAN:  Recommended HD today for better fluid management but he has declined.  Nephrology following and will keep on his routine HD schedule.  Continues to be on heparin  gtt and chest pain free.  [facility]    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 If 7PM-7AM, please contact night-coverage   "

## 2024-10-13 NOTE — Progress Notes (Addendum)
 PHARMACY - ANTICOAGULATION CONSULT NOTE  Pharmacy Consult for Heparin  Indication: AF  Allergies[1]  Patient Measurements:    Vital Signs: Temp: 98.2 F (36.8 C) (01/01 1955) Temp Source: Oral (01/01 1955) BP: (P) 165/99 (01/01 1920) Pulse Rate: 77 (01/01 1955)  Labs: Recent Labs    10/11/24 0842 10/11/24 1536 10/11/24 1536 10/12/24 0230 10/12/24 1307 10/13/24 0004 10/13/24 0348 10/13/24 1019 10/13/24 2152  HGB 8.2*  --   --  7.3*  --   --  8.8*  --   --   HCT 24.0*  --   --  22.9*  --   --  26.6*  --   --   PLT  --   --   --  154  --   --  161  --   --   APTT  --  40*  --   --   --   --   --   --   --   HEPARINUNFRC  --  <0.10*   < > <0.10*   < > 0.19*  --  0.33 0.11*  CREATININE  --   --   --  5.30*  --   --   --   --   --    < > = values in this interval not displayed.    Estimated Creatinine Clearance: 13.6 mL/min (A) (by C-G formula based on SCr of 5.3 mg/dL (H)).   Medical History: Past Medical History:  Diagnosis Date   AKI (acute kidney injury) 08/30/2018   Anemia of chronic disease    Chronic systolic heart failure (HCC)    Diabetes mellitus without complication (HCC)    no meds since weight loss   Dialysis patient    Mon, Wed, Fri   ESRD (end stage renal disease) (HCC)    on HD (M,W,F)   Hyperkalemia 02/04/2021   Hypertension    Metabolic acidosis, increased anion gap 02/04/2021   Paroxysmal atrial fibrillation (HCC)    Renal disorder    Sepsis without acute organ dysfunction (HCC) 11/30/2023    Medications:  Medications Prior to Admission  Medication Sig Dispense Refill Last Dose/Taking   [Paused] apixaban  (ELIQUIS ) 5 MG TABS tablet Take 1 tablet (5 mg total) by mouth 2 (two) times daily. 180 tablet 1    atorvastatin (LIPITOR) 40 MG tablet Take 1 tablet (40 mg total) by mouth daily.      blood glucose meter kit and supplies KIT Dispense based on patient and insurance preference. Use up to four times daily as directed. (FOR ICD-9 250.00,  250.01). 1 each 0    [START ON 10/14/2024] calcium  acetate (PHOSLO ) 667 MG capsule Take 2 capsules (1,334 mg total) by mouth 3 (three) times a week.      epoetin  alfa-epbx (RETACRIT ) 10000 UNIT/ML injection Inject 1 mL (10,000 Units total) into the vein every Monday, Wednesday, and Friday at 6 PM.      feeding supplement (ENSURE PLUS HIGH PROTEIN) LIQD Take 237 mLs by mouth 2 (two) times daily between meals.      heparin  25000 UT/250ML infusion Inject 1,350 Units/hr into the vein continuous.      hydrALAZINE  (APRESOLINE ) 25 MG tablet Take 25 mg by mouth 3 (three) times daily.      hydrOXYzine  (ATARAX ) 25 MG tablet Take 25 mg by mouth 2 (two) times daily.      metoprolol  tartrate (LOPRESSOR ) 25 MG tablet Take 1 tablet (25 mg total) by mouth 2 (two) times daily.  Multiple Vitamin (MULTIVITAMIN ADULT PO) Take 1 tablet by mouth daily.      omeprazole  (PRILOSEC) 20 MG capsule Take 20 mg by mouth every morning.      sacubitril-valsartan (ENTRESTO) 49-51 MG Take 1 tablet by mouth 2 (two) times daily. 60 tablet 3    tamsulosin  (FLOMAX ) 0.4 MG CAPS capsule Take 0.4 mg by mouth daily.       Assessment: 64 y/o M with a h/o AF on Eliquis  recently increased to 5 mg bid due to stroke risk (CHA2DS2/VAS of 3) but previously 2.5 mg bid subtherapeutic dose for cirrhosis/ESRD increased bleeding risk. LHC today after abnormal Myoview . Pharmacy consulted for heparin  bridging beginning 8 hours post sheath removal.    12/31 PM: Patient was transfused in setting of low Hgb (ESRD) and plans for cardiac surgery  -heparin  level= 0.11 on 1650 units/hr    Goal of Therapy:  Heparin  level 0.3-0.7 units/ml aPTT 66-102 seconds Monitor platelets by anticoagulation protocol: Yes   Plan:  -Increase heparin  to 1850 units/hr -Heparin  level in 8 hours and daily wth CBC daily  Prentice Poisson, PharmD Clinical Pharmacist **Pharmacist phone directory can now be found on [email or web address] ([email or web address]).  Listed under [facility]  Pharm[facility].             [1] No Known Allergies

## 2024-10-13 NOTE — Discharge Summary (Incomplete)
 " Physician Discharge Summary  Patient: Casey Franco FMW:[id number] DOB: June 22, 1961   Code Status: Full Code Admit date: 10/11/2024 Discharge date: 10/13/2024 Disposition: [facility] PCP: [facility]  Recommendations for Outpatient Follow-up:  Follow up with medicine service upon presentation to [facility].  consult nephrology routine HD, last session 12/31 included order for transfusion of 1u pRBC which was not completed  Consult cardiology/CVTS evaluation for CAD involving severe left main disease, as seen on LHC 12/30, considering surgical intervention  Discharge Diagnoses:  Principal Problem:   Acute on chronic heart failure with mildly reduced ejection fraction (HFmrEF) (HCC) Active Problems:   End-stage renal disease on hemodialysis (HCC)   Chronic heart failure with mildly reduced ejection fraction (HFmrEF) (HCC)   Positive cardiac stress test   Coronary artery disease involving native coronary artery of native heart without angina pectoris   Persistent atrial fibrillation (HCC)   ESRD (end stage renal disease) (HCC)   Abnormal stress test  Brief Hospital Course Summary: Casey Franco is a 64 y.o. male with a PMH significant for chronic HFrEF with LVEF 50% on most recent stress test, ESRD on HD MWF, HTN, HLD, PAF on Eliquis , hepatitis C and liver cirrhosis, presented with worsening of exertional dyspnea. Underwent stress test recently which showed a moderate size severe partially reversible defect involving the anterior wall apex and apical inferior segment consistent with scar and peri-infarct ischemia. LVEF 51%.  Presented to [facility] for elective LHC and RHC. cath found L main stenosis. Unable to transfer same day but [facility].  He is receiving HD today and have requested transfer. He is currently in HD, awaiting blood transfusion due to hgb <8 today and he is chest pain free.   Additionally:   ESRD on HD - Resume  routine HD schedule MWF. - Nephrology consulted   Recently diagnosed PAF - On heparin  drip, expect to switch back to Eliquis  eventually - Continue telemonitoring   Chronic normocytic anemia- hgb 7.3 today. Likely from chronic disease. No acute bleeding observed.  - 1u pRBC ordered - CBC am    Cirrhosis - Appears to be compensated - monitor LFT   Moderate protein calorie malnutrition -BMI= 19 - Start Ensure  Discharge Condition: Stable, improved Recommended discharge diet: Cardiac diet  Consultations: Cardiology Nephrology   Procedures/Studies: HD Cardiac cath 12/30 Echo   Allergies as of 10/13/2024   No Known Allergies      Medication List     PAUSE taking these medications    apixaban  5 MG Tabs tablet Wait to take this until your doctor or other care provider tells you to start again. Commonly known as: ELIQUIS  Take 1 tablet (5 mg total) by mouth 2 (two) times daily.       STOP taking these medications    albumin  human 25 % bottle   Calcium  Acetate 667 MG Tabs Replaced by: calcium  acetate 667 MG capsule   irbesartan  300 MG tablet Commonly known as: AVAPRO    melatonin 5 MG Tabs       TAKE these medications    amLODipine  10 MG tablet Commonly known as: NORVASC  Take 10 mg by mouth daily.   atorvastatin 40 MG tablet Commonly known as: LIPITOR Take 1 tablet (40 mg total) by mouth daily.   blood glucose meter kit and supplies Kit Dispense based on patient and insurance preference. Use up to four times daily as directed. (FOR ICD-9 250.00, 250.01).   calcium  acetate 667  MG capsule Commonly known as: PHOSLO  Take 2 capsules (1,334 mg total) by mouth 3 (three) times a week. Start taking on: October 14, 2024 Replaces: Calcium  Acetate 667 MG Tabs   epoetin  alfa-epbx 10000 UNIT/ML injection Commonly known as: RETACRIT  Inject 1 mL (10,000 Units total) into the vein every Monday, Wednesday, and Friday at 6 PM. What changed: when to take this    feeding supplement Liqd Take 237 mLs by mouth 2 (two) times daily between meals.   heparin  25000 UT/250ML infusion Inject 1,350 Units/hr into the vein continuous.   hydrALAZINE  25 MG tablet Commonly known as: APRESOLINE  Take 25 mg by mouth 3 (three) times daily.   hydrOXYzine  25 MG tablet Commonly known as: ATARAX  Take 25 mg by mouth 2 (two) times daily.   metoprolol  tartrate 25 MG tablet Commonly known as: LOPRESSOR  Take 1 tablet (25 mg total) by mouth 2 (two) times daily.   MULTIVITAMIN ADULT PO Take 1 tablet by mouth daily.   omeprazole  20 MG capsule Commonly known as: PRILOSEC Take 20 mg by mouth every morning.   tamsulosin  0.4 MG Caps capsule Commonly known as: FLOMAX  Take 0.4 mg by mouth daily.       Subjective   Pt reports feeling well. Has no chest pain or SOB currently at rest. Has not been out of bed today. Agreeable to transfer.  All questions and concerns were addressed at time of discharge.  Objective  Blood pressure (!) 159/90, pulse 79, temperature 97.7 F (36.5 C), temperature source Oral, resp. rate 20, height 6' (1.829 m), weight 70.6 kg, SpO2 99%.   General: Pt is alert, awake, not in acute distress. Currently undergoing HD.  Cardiovascular: RRR, S1/S2 +, no rubs, no gallops Respiratory: CTA bilaterally, no wheezing, no rhonchi Abdominal: Soft, NT, ND, bowel sounds + Extremities: no edema, no cyanosis  The results of significant diagnostics from this hospitalization (including imaging, microbiology, ancillary and laboratory) are listed below for reference.   Imaging studies: DG Chest 1 View Result Date: 10/11/2024 CLINICAL DATA:  Congestive heart failure EXAM: DG CHEST 1V COMPARISON:  12/23/2023 FINDINGS: Right-sided dialysis catheter tip overlies the SVC. The heart is enlarged. Slight globular appearance of the cardiopericardial silhouette compared to prior may be due to differences in technique and positioning. Small to moderate right and  small left pleural effusion. Vascular congestion. No confluent opacity. No pneumothorax. IMPRESSION: 1. Cardiomegaly with vascular congestion and bilateral pleural effusions, right greater than left. 2. Slight globular appearance of the cardiopericardial silhouette compared to prior may be due to differences in technique and positioning. Recommend correlation with echocardiogram to exclude pericardial effusion. Electronically Signed   By: Andrea Gasman M.D.   On: 10/11/2024 16:42   CARDIAC CATHETERIZATION Result Date: 10/11/2024 Conclusions: Severe LMCA and LAD disease with heavy calcification of the coronary arteries, as detailed below. Severely elevated left heart, right heart, and pulmonary artery pressures.  Equalization of end-diastolic pressures noted, which can be seen with restrictive/constrictive physiology. Normal Fick cardiac output/index. Recommendation: Admit for fluid removal via hemodialysis and optimization of goal-directed medical therapy. Transfer to [facility] when bed available for cardiac surgery +/- advanced heart failure consultation. Aggressive secondary prevention of coronary artery disease. Initiate heparin  infusion in lieu of apixaban  8 hours after removal of femoral sheaths. Lonni Hanson, MD [facility]  LONG TERM MONITOR (3-14 DAYS) Result Date: 10/09/2024 Event monitor Patch Wear Time:  13 days and 19 hours (2025-11-22T14:19:20-0500 to 2025-12-06T09:44:05-499) Normal sinus rhythm Patient had a min HR of 59  bpm, max HR of 179 bpm, and avg HR of 81 bpm. 5 Ventricular Tachycardia runs occurred, the run with the fastest interval lasting 9 beats with a max rate of 179 bpm, the longest lasting 15 beats with an avg rate of 156 bpm. 15 Supraventricular Tachycardia/atrial tachycardia runs occurred, the run with the fastest interval lasting 6 beats with a max rate of 145 bpm, the longest lasting 13 beats with an avg rate of 110 bpm.  Isolated SVEs were rare (<1.0%), SVE Couplets  were rare (<1.0%), and SVE Triplets were rare (<1.0%). Isolated VEs were occasional (1.9%, 30487), VE Couplets were rare (<1.0%, 63), and VE Triplets were rare (<1.0%, 3). Ventricular Bigeminy and Trigeminy were present. No patient triggered events recorded Signed, Velinda Lunger, MD, Ph.D [facility]  NM Myocar Multi W/Spect Marisela Motion / EF Result Date: 09/20/2024   Abnormal pharmacologic myocardial perfusion stress test.   There is a moderate in size, severe, partially reversible defect involving the anterior wall, apex, and apical inferior segment consistent with scar and peri-infarct ischemia.   Left ventricular systolic function is low normal to mildly reduced (LVEF 51%) with anterior and apical hypokinesis.   Extensive coronary artery calcification is evident on the attenuation correction CT.   Moderate right pleural effusion is present with right lower lobe atelectasis versus consolidation.  Effusion appears larger in size compared to prior CT of the abdomen and pelvis from 01/21/2024.  Trivial left pleural effusion and abdominal ascites are also noted.   This is an intermediate risk study.    Labs: Basic Metabolic Panel: Recent Labs  Lab 10/11/24 0841 10/11/24 0842 10/12/24 0230 10/12/24 1307  NA 140 140 137  --   K 3.6 3.6 3.7  --   CL  --   --  101  --   CO2  --   --  23  --   GLUCOSE  --   --  98  --   BUN  --   --  46*  --   CREATININE  --   --  5.30*  --   CALCIUM   --   --  9.1  --   PHOS  --   --   --  2.1*   CBC: Recent Labs  Lab 10/11/24 0841 10/11/24 0842 10/12/24 0230 10/13/24 0348  WBC  --   --  4.9 6.4  HGB 8.8* 8.2* 7.3* 8.8*  HCT 26.0* 24.0* 22.9* 26.6*  MCV  --   --  90.5 89.6  PLT  --   --  154 161   Microbiology: Results for orders placed or performed during the hospital encounter of 01/05/24  Acid Fast Culture with reflexed sensitivities     Status: None   Collection Time: 01/05/24 12:05 PM   Specimen: PATH Cytology Peritoneal fluid; Body Fluid   Result Value Ref Range Status   Acid Fast Culture Negative  Final    Comment: (NOTE) No acid fast bacilli isolated after 6 weeks. Performed At: [facility] [address] [place], [place] [id number] Jennette Shorter MD Ey:[id number]    Source of Sample PERITONEAL  Final    Comment: Performed at [facility], [address] [place], [place] [address]  Acid Fast Smear (AFB)     Status: None   Collection Time: 01/05/24 12:05 PM   Specimen: PATH Cytology Peritoneal fluid; Body Fluid  Result Value Ref Range Status   AFB Specimen Processing Concentration  Final   Acid Fast Smear Negative  Final    Comment: (NOTE) Performed At: [facility] [address] [place], [place] [id number] Jennette Shorter MD Ey:[id number]    Source (AFB) PERITONEAL  Final    Comment: Performed at [facility], [address] [place], [place] [address]  Body fluid culture w Gram Stain     Status: None   Collection Time: 01/05/24 12:05 PM   Specimen: PATH Cytology Peritoneal fluid; Body Fluid  Result Value Ref Range Status   Specimen Description   Final    PERITONEAL Performed at [facility], [address], [place], [place] [address]    Special Requests   Final    NONE Performed at [facility], [address] [place], [place] [address]    Gram Stain   Final    NO WBC SEEN MODERATE GRAM POSITIVE COCCI IN PAIRS Performed at [facility], [address] [place], [place] [address]    Culture   Final    MIXED ANAEROBIC FLORA PRESENT.  CALL LAB IF FURTHER IID REQUIRED.   Report Status 01/07/2024 FINAL  Final    Time coordinating discharge: Over 30 minutes  [name], MD  [facility] 10/13/2024, 7:49 AM  "

## 2024-10-14 ENCOUNTER — Other Ambulatory Visit: Payer: Self-pay

## 2024-10-14 ENCOUNTER — Inpatient Hospital Stay (HOSPITAL_COMMUNITY)

## 2024-10-14 DIAGNOSIS — I4892 Unspecified atrial flutter: Secondary | ICD-10-CM | POA: Diagnosis not present

## 2024-10-14 DIAGNOSIS — D631 Anemia in chronic kidney disease: Secondary | ICD-10-CM | POA: Diagnosis not present

## 2024-10-14 DIAGNOSIS — N186 End stage renal disease: Secondary | ICD-10-CM | POA: Diagnosis not present

## 2024-10-14 DIAGNOSIS — K7460 Unspecified cirrhosis of liver: Secondary | ICD-10-CM | POA: Diagnosis not present

## 2024-10-14 DIAGNOSIS — I132 Hypertensive heart and chronic kidney disease with heart failure and with stage 5 chronic kidney disease, or end stage renal disease: Secondary | ICD-10-CM | POA: Diagnosis not present

## 2024-10-14 DIAGNOSIS — I2510 Atherosclerotic heart disease of native coronary artery without angina pectoris: Secondary | ICD-10-CM | POA: Diagnosis not present

## 2024-10-14 DIAGNOSIS — B1920 Unspecified viral hepatitis C without hepatic coma: Secondary | ICD-10-CM | POA: Diagnosis not present

## 2024-10-14 DIAGNOSIS — I5082 Biventricular heart failure: Secondary | ICD-10-CM | POA: Diagnosis not present

## 2024-10-14 DIAGNOSIS — Z862 Personal history of diseases of the blood and blood-forming organs and certain disorders involving the immune mechanism: Secondary | ICD-10-CM | POA: Insufficient documentation

## 2024-10-14 DIAGNOSIS — I5023 Acute on chronic systolic (congestive) heart failure: Secondary | ICD-10-CM | POA: Diagnosis not present

## 2024-10-14 DIAGNOSIS — I4891 Unspecified atrial fibrillation: Secondary | ICD-10-CM | POA: Diagnosis not present

## 2024-10-14 LAB — CBC WITH DIFFERENTIAL/PLATELET
Abs Immature Granulocytes: 0.02 K/uL (ref 0.00–0.07)
Basophils Absolute: 0 K/uL (ref 0.0–0.1)
Basophils Relative: 0 %
Eosinophils Absolute: 0.3 K/uL (ref 0.0–0.5)
Eosinophils Relative: 5 %
HCT: 28.4 % — ABNORMAL LOW (ref 39.0–52.0)
Hemoglobin: 9.3 g/dL — ABNORMAL LOW (ref 13.0–17.0)
Immature Granulocytes: 0 %
Lymphocytes Relative: 11 %
Lymphs Abs: 0.8 K/uL (ref 0.7–4.0)
MCH: 29.7 pg (ref 26.0–34.0)
MCHC: 32.7 g/dL (ref 30.0–36.0)
MCV: 90.7 fL (ref 80.0–100.0)
Monocytes Absolute: 0.7 K/uL (ref 0.1–1.0)
Monocytes Relative: 11 %
Neutro Abs: 5 K/uL (ref 1.7–7.7)
Neutrophils Relative %: 73 %
Platelets: 172 K/uL (ref 150–400)
RBC: 3.13 MIL/uL — ABNORMAL LOW (ref 4.22–5.81)
RDW: 17.9 % — ABNORMAL HIGH (ref 11.5–15.5)
WBC: 6.9 K/uL (ref 4.0–10.5)
nRBC: 0 % (ref 0.0–0.2)

## 2024-10-14 LAB — HEPATITIS B SURFACE ANTIGEN: Hepatitis B Surface Ag: NONREACTIVE

## 2024-10-14 LAB — CBC
HCT: 30.2 % — ABNORMAL LOW (ref 39.0–52.0)
Hemoglobin: 10 g/dL — ABNORMAL LOW (ref 13.0–17.0)
MCH: 30 pg (ref 26.0–34.0)
MCHC: 33.1 g/dL (ref 30.0–36.0)
MCV: 90.7 fL (ref 80.0–100.0)
Platelets: 178 K/uL (ref 150–400)
RBC: 3.33 MIL/uL — ABNORMAL LOW (ref 4.22–5.81)
RDW: 17.8 % — ABNORMAL HIGH (ref 11.5–15.5)
WBC: 8.2 K/uL (ref 4.0–10.5)
nRBC: 0 % (ref 0.0–0.2)

## 2024-10-14 LAB — COMPREHENSIVE METABOLIC PANEL WITH GFR
ALT: 8 U/L (ref 0–44)
AST: 19 U/L (ref 15–41)
Albumin: 3.3 g/dL — ABNORMAL LOW (ref 3.5–5.0)
Alkaline Phosphatase: 141 U/L — ABNORMAL HIGH (ref 38–126)
Anion gap: 14 (ref 5–15)
BUN: 47 mg/dL — ABNORMAL HIGH (ref 8–23)
CO2: 22 mmol/L (ref 22–32)
Calcium: 8.9 mg/dL (ref 8.9–10.3)
Chloride: 98 mmol/L (ref 98–111)
Creatinine, Ser: 5.62 mg/dL — ABNORMAL HIGH (ref 0.61–1.24)
GFR, Estimated: 11 mL/min — ABNORMAL LOW
Glucose, Bld: 123 mg/dL — ABNORMAL HIGH (ref 70–99)
Potassium: 3.6 mmol/L (ref 3.5–5.1)
Sodium: 134 mmol/L — ABNORMAL LOW (ref 135–145)
Total Bilirubin: 0.6 mg/dL (ref 0.0–1.2)
Total Protein: 7.7 g/dL (ref 6.5–8.1)

## 2024-10-14 LAB — MAGNESIUM: Magnesium: 1.9 mg/dL (ref 1.7–2.4)

## 2024-10-14 LAB — TYPE AND SCREEN
ABO/RH(D): O POS
Antibody Screen: NEGATIVE

## 2024-10-14 LAB — HEPARIN LEVEL (UNFRACTIONATED)
Heparin Unfractionated: 0.18 [IU]/mL — ABNORMAL LOW (ref 0.30–0.70)
Heparin Unfractionated: 0.22 [IU]/mL — ABNORMAL LOW (ref 0.30–0.70)

## 2024-10-14 LAB — PRO BRAIN NATRIURETIC PEPTIDE: Pro Brain Natriuretic Peptide: >35000 pg/mL — ABNORMAL HIGH

## 2024-10-14 LAB — PHOSPHORUS: Phosphorus: 4.1 mg/dL (ref 2.5–4.6)

## 2024-10-14 MED ORDER — CHLORHEXIDINE GLUCONATE CLOTH 2 % EX PADS: 6.0000 | MEDICATED_PAD | Freq: Every day | CUTANEOUS | Status: DC

## 2024-10-14 MED ORDER — HYDRALAZINE HCL 50 MG PO TABS
50.0000 mg | ORAL_TABLET | Freq: Three times a day (TID) | ORAL | Status: DC
  Administered 2024-10-14 – 2024-10-23 (×21): 50 mg via ORAL
  Filled 2024-10-14 (×23): qty 1

## 2024-10-14 MED ORDER — ANTICOAGULANT SODIUM CITRATE 4% (200MG/5ML) IV SOLN: 5.0000 mL | Status: DC | PRN

## 2024-10-14 MED ORDER — LIDOCAINE-PRILOCAINE 2.5-2.5 % EX CREA: 1.0000 | TOPICAL_CREAM | CUTANEOUS | Status: DC | PRN

## 2024-10-14 MED ORDER — CHLORHEXIDINE GLUCONATE CLOTH 2 % EX PADS
6.0000 | MEDICATED_PAD | Freq: Every day | CUTANEOUS | Status: DC
  Administered 2024-10-14: 6 via TOPICAL

## 2024-10-14 MED ORDER — ASPIRIN 81 MG PO CHEW
81.0000 mg | CHEWABLE_TABLET | Freq: Every day | ORAL | Status: DC
  Administered 2024-10-15 – 2024-10-17 (×3): 81 mg via ORAL
  Filled 2024-10-14 (×3): qty 1

## 2024-10-14 MED ORDER — CHLORHEXIDINE GLUCONATE CLOTH 2 % EX PADS
6.0000 | MEDICATED_PAD | Freq: Every day | CUTANEOUS | Status: DC
  Administered 2024-10-15 – 2024-10-18 (×4): 6 via TOPICAL

## 2024-10-14 MED ORDER — PENTAFLUOROPROP-TETRAFLUOROETH EX AERO: 1.0000 | INHALATION_SPRAY | CUTANEOUS | Status: DC | PRN

## 2024-10-14 MED ORDER — ISOSORBIDE MONONITRATE ER 30 MG PO TB24
30.0000 mg | ORAL_TABLET | Freq: Every day | ORAL | Status: DC
  Administered 2024-10-15 – 2024-10-23 (×9): 30 mg via ORAL
  Filled 2024-10-14 (×9): qty 1

## 2024-10-14 MED ORDER — HEPARIN SODIUM (PORCINE) 1000 UNIT/ML DIALYSIS: 1000.0000 [IU] | INTRAMUSCULAR | Status: DC | PRN

## 2024-10-14 MED ORDER — LIDOCAINE HCL (PF) 1 % IJ SOLN: 5.0000 mL | INTRAMUSCULAR | Status: DC | PRN

## 2024-10-14 MED ORDER — HEPARIN SODIUM (PORCINE) 1000 UNIT/ML IJ SOLN
INTRAMUSCULAR | Status: AC
  Filled 2024-10-14: qty 4

## 2024-10-14 NOTE — TOC Initial Note (Signed)
 Transition of Care [facility]) - Initial/Assessment Note    Patient Details  Name: Casey Franco MRN: [id number] Date of Birth: [birth year]  [facility] CM/SW Contact:    Sudie Erminio Deems, RN Phone Number: 10/14/2024, 11:50 AM  Clinical Narrative: Risk for readmission assessment completed. Patient presented for shortness of breath-post R/LHC. Hx ESRD-MWF schedule. PTA patient states he is from home with his sister.  Patient has PCP at [facility] and they provide transportation to appointments. Patient has DME rolling walker, however, does not use it at this time. No home health needs identified during the visit. ICM will continue to follow for additional disposition needs.            Expected Discharge Plan: Home/Self Care Barriers to Discharge: No Barriers Identified  Patient Goals and CMS Choice Patient states their goals for this hospitalization and ongoing recovery are:: Patient plans to return home with sister.   Choice offered to / list presented to : NA  Expected Discharge Plan and Services In-house Referral: NA Discharge Planning Services: CM Consult Post Acute Care Choice: NA Living arrangements for the past 2 months: Single Family Home                   DME Agency: NA       HH Arranged: NA  Prior Living Arrangements/Services Living arrangements for the past 2 months: Single Family Home Lives with:: Siblings Patient language and need for interpreter reviewed:: Yes Do you feel safe going back to the place where you live?: Yes      Need for Family Participation in Patient Care: No (Comment) Care giver support system in place?: No (comment) Current home services: DME (has a rolling walker; however, does not need to use it.) Criminal Activity/Legal Involvement Pertinent to Current Situation/Hospitalization: No - Comment as needed  Activities of Daily Living      Permission Sought/Granted Permission sought to share information with : Family  Supports, Case Manager                Emotional Assessment Appearance:: Appears stated age Attitude/Demeanor/Rapport: Engaged Affect (typically observed): Appropriate Orientation: : Oriented to Self, Oriented to Place, Oriented to  Time, Oriented to Situation Alcohol / Substance Use: Not Applicable Psych Involvement: No (comment)  Admission diagnosis:  Acute on chronic systolic heart failure (HCC) [I50.23] Patient Active Problem List   Diagnosis Date Noted   History of anemia due to chronic kidney disease 10/14/2024   Acute on chronic systolic heart failure (HCC) 10/13/2024   ESRD (end stage renal disease) (HCC) 10/12/2024   Abnormal stress test 10/12/2024   Positive cardiac stress test 10/11/2024   Acute on chronic heart failure with mildly reduced ejection fraction (HFmrEF) (HCC) 10/11/2024   CAD (coronary artery disease) 10/11/2024   Persistent atrial fibrillation (HCC) 10/11/2024   Complication associated with dialysis catheter 05/20/2024   Thrombocytopenia 12/16/2023   Pressure injury of skin 12/16/2023   Benign prostatic hyperplasia without lower urinary tract symptoms 12/16/2023   Chronic heart failure with mildly reduced ejection fraction (HFmrEF) (HCC) 12/16/2023   Dependence on renal dialysis 12/16/2023   Hypertensive heart and chronic kidney disease with heart failure and with stage 5 chronic kidney disease, or end stage renal disease (HCC) 12/16/2023   Paroxysmal atrial fibrillation (HCC) 11/29/2023   Spontaneous bacterial peritonitis (HCC) 11/27/2023   Abdominal pain 11/26/2023   Myocardial injury 11/26/2023   Cirrhosis of liver with ascites (HCC) 11/26/2023   Type II diabetes mellitus with  renal manifestations (HCC) 11/26/2023   Stage 5 chronic kidney disease (HCC) 12/17/2021   GERD (gastroesophageal reflux disease)    AVM (arteriovenous malformation) of small bowel, acquired    Polyp of descending colon    End-stage renal disease on hemodialysis (HCC)  02/04/2021   Essential hypertension 02/04/2021   Anemia of chronic disease 02/04/2021   Chronic hepatitis C without hepatic coma (HCC) 02/04/2021   Secondary hyperparathyroidism of renal origin 05/16/2020   History of cocaine  use 05/14/2020   History of diabetes mellitus, type II 05/14/2020   Proteinuria, unspecified 07/24/2014   Diabetes mellitus, type II (HCC) 07/19/2014   PCP:  [facility] Pharmacy:   [facility] [id number]), [place] - [address] [place]) [place] [address] Phone: [phone] Fax: [phone]  [facility] [address] [place] [address] Phone: [phone] Fax: [phone]     Social Drivers of Health (SDOH) Social History: SDOH Screenings   Food Insecurity: Patient Declined (10/14/2024)  Housing: Low Risk (10/14/2024)  Transportation Needs: No Transportation Needs (10/14/2024)  Utilities: Not At Risk (10/14/2024)  Tobacco Use: Medium Risk (10/13/2024)   SDOH Interventions:     Readmission Risk Interventions    10/14/2024   11:47 AM  Readmission Risk Prevention Plan  Transportation Screening Complete  HRI or Home Care Consult Complete  Social Work Consult for Recovery Care Planning/Counseling Complete  Palliative Care Screening Not Applicable  Medication Review Oceanographer) Referral to Pharmacy

## 2024-10-14 NOTE — Progress Notes (Signed)
 PHARMACY - ANTICOAGULATION CONSULT NOTE  Pharmacy Consult for Heparin  Indication: AF  Allergies[1]  Patient Measurements: Weight: 70.6 kg (155 lb 10.3 oz)  Vital Signs: Temp: 98.3 F (36.8 C) (01/02 2018) Temp Source: Oral (01/02 2018) BP: 115/69 (01/02 2018) Pulse Rate: 75 (01/02 2018)  Labs: Recent Labs    10/12/24 0230 10/12/24 1307 10/13/24 0348 10/13/24 1019 10/13/24 2152 10/14/24 0829 10/14/24 1044 10/14/24 1933  HGB 7.3*  --  8.8*  --   --   --  9.3* 10.0*  HCT 22.9*  --  26.6*  --   --   --  28.4* 30.2*  PLT 154  --  161  --   --   --  172 178  HEPARINUNFRC <0.10*   < >  --    < > 0.11* 0.18*  --  0.22*  CREATININE 5.30*  --   --   --   --   --  5.62*  --    < > = values in this interval not displayed.    Estimated Creatinine Clearance: 13.3 mL/min (A) (by C-G formula based on SCr of 5.62 mg/dL (H)).   Medical History: Past Medical History:  Diagnosis Date   AKI (acute kidney injury) 08/30/2018   Anemia of chronic disease    Chronic systolic heart failure (HCC)    Diabetes mellitus without complication (HCC)    no meds since weight loss   Dialysis patient    Mon, Wed, Fri   ESRD (end stage renal disease) (HCC)    on HD (M,W,F)   Hyperkalemia 02/04/2021   Hypertension    Metabolic acidosis, increased anion gap 02/04/2021   Paroxysmal atrial fibrillation (HCC)    Renal disorder    Sepsis without acute organ dysfunction (HCC) 11/30/2023    Medications:  Medications Prior to Admission  Medication Sig Dispense Refill Last Dose/Taking   [Paused] apixaban  (ELIQUIS ) 5 MG TABS tablet Take 1 tablet (5 mg total) by mouth 2 (two) times daily. 180 tablet 1 10/07/2024   calcium  acetate (PHOSLO ) 667 MG capsule Take 2 capsules (1,334 mg total) by mouth 3 (three) times a week.   10/10/2024   hydrALAZINE  (APRESOLINE ) 25 MG tablet Take 25 mg by mouth 3 (three) times daily.   10/11/2024   hydrOXYzine  (ATARAX ) 25 MG tablet Take 25 mg by mouth 2 (two) times daily.    10/10/2024   Multiple Vitamin (MULTIVITAMIN ADULT PO) Take 1 tablet by mouth daily.   10/10/2024   omeprazole  (PRILOSEC) 20 MG capsule Take 20 mg by mouth every morning.   10/10/2024   tamsulosin  (FLOMAX ) 0.4 MG CAPS capsule Take 0.4 mg by mouth daily.   10/10/2024   albumin  human 25 % bottle Inject into the vein. (Patient not taking: Reported on 10/14/2024)   Not Taking   amLODipine  (NORVASC ) 10 MG tablet Take 10 mg by mouth daily. (Patient not taking: Reported on 10/14/2024)   Not Taking   atorvastatin (LIPITOR) 40 MG tablet Take 1 tablet (40 mg total) by mouth daily.      epoetin  alfa-epbx (RETACRIT ) 10000 UNIT/ML injection Inject 1 mL (10,000 Units total) into the vein every Monday, Wednesday, and Friday at 6 PM. (Patient not taking: Reported on 10/14/2024)   Not Taking   feeding supplement (ENSURE PLUS HIGH PROTEIN) LIQD Take 237 mLs by mouth 2 (two) times daily between meals.      heparin  25000 UT/250ML infusion Inject 1,350 Units/hr into the vein continuous.      irbesartan  (AVAPRO )  300 MG tablet Take 300 mg by mouth daily. (Patient not taking: Reported on 10/14/2024)   Not Taking   melatonin 5 MG TABS Take 5 mg by mouth at bedtime as needed (sleep). (Patient not taking: Reported on 10/14/2024)   Not Taking   metoprolol  tartrate (LOPRESSOR ) 25 MG tablet Take 1 tablet (25 mg total) by mouth 2 (two) times daily. (Patient not taking: Reported on 10/14/2024)   Not Taking   sacubitril-valsartan (ENTRESTO) 49-51 MG Take 1 tablet by mouth 2 (two) times daily. 60 tablet 3     Assessment: 64 y/o M with a h/o AF on Eliquis  recently increased to 5 mg bid due to stroke risk (CHA2DS2/VAS of 3) but previously 2.5 mg bid subtherapeutic dose for cirrhosis/ESRD increased bleeding risk. LHC with LM disease. Pharmacy consulted for heparin  bridging .    Heparin  level 0.22, subtherapeutic on heparin  2000 units/hr.     Goal of Therapy:  Heparin  level 0.3-0.7 units/ml aPTT 66-102 seconds Monitor platelets by  anticoagulation protocol: Yes   Plan:  Increase heparin  to 2150 units/hr Heparin  level in 8 hrs  Prentice Poisson, PharmD Clinical Pharmacist **Pharmacist phone directory can now be found on [email or web address] ([email or web address]).  Listed under [facility].        [1] No Known Allergies

## 2024-10-14 NOTE — Progress Notes (Addendum)
 "  Rounding Note   Patient Name: Casey Franco Date of Encounter: 10/14/2024  [facility] Cardiologist: Casey Gollan, MD   Subjective Pt seen sitting on the side of the bed. No chest pain, feels breathing is improving.   Scheduled Meds:  atorvastatin  40 mg Oral Daily   Chlorhexidine  Gluconate Cloth  6 each Topical Daily   Chlorhexidine  Gluconate Cloth  6 each Topical Q0600   hydrALAZINE   25 mg Oral Q8H   metoprolol  tartrate  25 mg Oral BID   pantoprazole   40 mg Oral Daily   sacubitril-valsartan  1 tablet Oral BID   Continuous Infusions:  heparin  1,850 Units/hr (10/14/24 0239)   PRN Meds: acetaminophen  **OR** acetaminophen , alteplase , heparin , melatonin, ondansetron  (ZOFRAN ) IV   Vital Signs  Vitals:   10/13/24 2046 10/13/24 2300 10/14/24 0451 10/14/24 0510  BP:  134/83  120/76  Pulse:  80 80   Temp:   98.9 F (37.2 C)   TempSrc:   Oral   SpO2: 98%  96%   Weight:   69.4 kg    No intake or output data in the 24 hours ending 10/14/24 0853    10/14/2024    4:51 AM 10/12/2024    2:09 PM 10/12/2024   10:14 AM  Last 3 Weights  Weight (lbs) 152 lb 14.4 oz 150 lb 5.7 oz 155 lb 10.3 oz  Weight (kg) 69.355 kg 68.2 kg 70.6 kg      Telemetry SR with first degree heart block - Personally Reviewed  ECG  No new tracings - Personally Reviewed  Physical Exam  GEN: No acute distress.   Neck: ++JVD Cardiac: RRR, no murmurs, rubs, or gallops.  Respiratory: Clear to auscultation bilaterally. GI: Soft, nontender, non-distended  MS: No edema; No deformity. Neuro:  Nonfocal  Psych: Normal affect   Labs High Sensitivity Troponin:  No results for input(s): TROPONINIHS in the last 720 hours. No results for input(s): TRNPT in the last 720 hours.     Chemistry Recent Labs  Lab 10/11/24 [id number] 10/11/24 0842 10/11/24 1536 10/12/24 0230  NA 140 140  --  137  K 3.6 3.6  --  3.7  CL  --   --   --  101  CO2  --   --   --  23  GLUCOSE  --   --   --  98  BUN  --    --   --  46*  CREATININE  --   --   --  5.30*  CALCIUM   --   --   --  9.1  PROT  --   --  8.1  --   ALBUMIN   --   --  3.4*  --   AST  --   --  22  --   ALT  --   --  7  --   ALKPHOS  --   --  151*  --   BILITOT  --   --  0.6  --   GFRNONAA  --   --   --  11*  ANIONGAP  --   --   --  13    Lipids No results for input(s): CHOL, TRIG, HDL, LABVLDL, LDLCALC, CHOLHDL in the last 168 hours.  Hematology Recent Labs  Lab 10/11/24 0842 10/11/24 1536 10/12/24 0230 10/13/24 0348  WBC  --   --  4.9 6.4  RBC  --  2.80* 2.53* 2.97*  HGB 8.2*  --  7.3*  8.8*  HCT 24.0*  --  22.9* 26.6*  MCV  --   --  90.5 89.6  MCH  --   --  28.9 29.6  MCHC  --   --  31.9 33.1  RDW  --   --  17.3* 17.9*  PLT  --   --  154 161   Thyroid No results for input(s): TSH, FREET4 in the last 168 hours.  BNPNo results for input(s): BNP, PROBNP in the last 168 hours.  DDimer No results for input(s): DDIMER in the last 168 hours.   Radiology  No results found.  Cardiac Studies  Heart cath 10/11/24: Conclusions: Severe LMCA and LAD disease with heavy calcification of the coronary arteries, as detailed below. Severely elevated left heart, right heart, and pulmonary artery pressures.  Equalization of end-diastolic pressures noted, which can be seen with restrictive/constrictive physiology. Normal Fick cardiac output/index.   Recommendation: Admit for fluid removal via hemodialysis and optimization of goal-directed medical therapy. Transfer to [facility] when bed available for cardiac surgery +/- advanced heart failure consultation. Aggressive secondary prevention of coronary artery disease. Initiate heparin  infusion in lieu of apixaban  8 hours after removal of femoral sheaths.  Patient Profile    64 y.o. male with a history of ESRD on HD, HCV with cirrhosis, hx of ascites/varices, PAF, CAD, and HFrEF secondary to ICM.   He presented to [facility] with SOB, unable to do daily activities like  walk in the grocer store. He is adamant that he never had chest pain.   Assessment & Plan   Severe multivessel disease - heart cath 10/11/24 as above with severe LM disease - he denies chest pain, continue heparin  gtt - has been discussed by IC and CTS teams, will treat with medical therapy   Acute on chronic systolic heart failure, BiV failure Moderate to severe MR Hypertension - heart cath with severely elevated pressures with concern for equalization of EDP - volume largely managed with HD - started on entresto 49-51 mg BID, change metoprolol  tartrate to succinate - holding amlodipine  - albumin  3.3 - proBNP remains > 35k - AHF team now following    ESRD on HD Anemia of chronic disease - per nephrology - pt has refused fistula placement in the past - PRBC with HD on 10/12/24, Hb 7.3 --> 8.8   PAF Chronic anticoagulation - on heparin  gtt - continue BB - transition to metoprolol  succinate - telemetry appears SR with first degree heart block - no amiodarone given hx of cirrhosis - eliquis  was initially held for CTS evaluation - may consider restarting DOAC if no PCI planned  ATTENDING ATTESTATION:  After conducting a review of all available clinical information with the care team, interviewing the patient, and performing a physical exam, I agree with the findings and plan described in this note with adjustments as indicated below which were discussed and enacted by staff above.   GEN: No acute distress, AO x 3 HEENT:  MMM, ++ JVD, no scleral icterus Cardiac: RRR, no murmurs, rubs, or gallops.  Respiratory: Clear to auscultation bilaterally. GI: Soft, nontender, non-distended  MS: No edema; No deformity. Neuro:  Nonfocal  Vasc:  +2 radial pulses  Patient is 32M with  ESRD, HCV with cirrhosis, ascities, varices, new PAF, and CM with EF 40-45%.  Also with moderate RV dysfunction.  Cath demonstrated high grade LM, ostial LAD, occluded D2.  RHC with massively elevated  filling pressures.  Patient discussed at Heart Team meeting>>not a candidate for  PCI.  Patient refuses any revascularization procedures.  Plan for aggressive HD for volume removal  Anticipate CTS turn down  AHF to see  Likely restart Eliquis   AHF to assume care  Lurena Red, MD Pager [phone]    For questions or updates, please contact [facility] Please consult [email or web address] for contact info under       Signed, Casey Nat Hails, PA  10/14/2024, 8:53 AM    "

## 2024-10-14 NOTE — Progress Notes (Signed)
" °   10/14/24 1700  Vitals  Temp 97.9 F (36.6 C)  Temp Source Axillary  BP 121/71  MAP (mmHg) 87  BP Location Left Arm  BP Method Automatic  Patient Position (if appropriate) Lying  Pulse Rate 74  ECG Heart Rate 74  Resp (!) 22  Weight 70.6 kg  Type of Weight Post-Dialysis  Oxygen Therapy  SpO2 96 %  O2 Device Room Air  Pulse Oximetry Type Continuous  During Treatment Monitoring  Blood Flow Rate (mL/min) 0 mL/min  Arterial Pressure (mmHg) -30.3 mmHg  Venous Pressure (mmHg) 56.56 mmHg  TMP (mmHg) 3.23 mmHg  Ultrafiltration Rate (mL/min) 1119 mL/min  Dialysate Flow Rate (mL/min) 300 ml/min  Duration of HD Treatment -hour(s) 3.42 hour(s)  Cumulative Fluid Removed (mL) per Treatment  3000.26  HD Safety Checks Performed Yes  Intra-Hemodialysis Comments Tx completed;Progressing as prescribed    "

## 2024-10-14 NOTE — Consult Note (Signed)
 ESRD Consult Note  Requesting provider: [facility] Reason for consult: ESRD, provision of dialysis  Assessment/Recommendations:  ESRD  -outpatient HD orders: [facility].  MWF.  3 hours 15 minutes.  EDW 65 kg.  TDC.  400/800 flow rates.  3K/2.5 calcium .  Heparin : 800 units bolus, 400 units hourly.  Meds: IDPN, Mircera 150 mcg every 2 weeks (last dose as an outpatient 12/29), Venofer 50 mg q. weekly -HD today, will reassess for further HD needs tomorrow based on his volume status  Acute on chronic biventricular heart failure - UF as tolerated -cardiology to see  CAD, severe multivessel disease - On heparin  drip, cardiology to see  Volume/ hypertension  -UF as tolerated  Anemia of Chronic Kidney Disease -Iron replete on 12/30, continue with ESA here, not due for ESA yet -Transfuse PRN for Hgb <7  Secondary Hyperparathyroidism/Hyperphosphatemia - monitor phos   # Additional recommendations: - Dose all meds for creatinine clearance < 10 ml/min  - Unless absolutely necessary, no MRIs with gadolinium.  - Implement save arm precautions.  Prefer needle sticks in the dorsum of the hands or wrists.  No blood pressure measurements in arm. - If blood transfusion is requested during hemodialysis sessions, please alert us  prior to the session.  - If a hemodialysis catheter line culture is requested, please alert us  as only hemodialysis nurses are able to collect those specimens.   Recommendations were discussed with the primary team.  Ephriam Stank, MD [facility]  History of Present Illness: Casey Franco is a/an 64 y.o. male with a past medical history of ESRD chronic biventricular systolic CHF, paroxysmal A-fib, moderate to severe MR, hep C, cirrhosis, hypertension, hyperlipidemia who presents for evaluation of revascularization options of patient's multivessel obstructive CAD.  He initially presented to [facility] on 12/ 30 for abnormal stress test.  He presented there for  an elective left and right heart cath which showed severe multivessel disease. Patient seen in room, he reports feeling 'great'. He denies any chest pain, SOB, swelling. Is watching fluid intake very carefully.   Medications:  Current Facility-Administered Medications  Medication Dose Route Frequency Provider Last Rate Last Admin   acetaminophen  (TYLENOL ) tablet 650 mg  650 mg Oral Q6H PRN Howerter, Justin B, DO       Or   acetaminophen  (TYLENOL ) suppository 650 mg  650 mg Rectal Q6H PRN Howerter, Justin B, DO       alteplase  (CATHFLO ACTIVASE ) injection 2 mg  2 mg Intracatheter Once PRN Druscilla Bald, NP       atorvastatin (LIPITOR) tablet 40 mg  40 mg Oral Daily Howerter, Justin B, DO       Chlorhexidine  Gluconate Cloth 2 % PADS 6 each  6 each Topical Daily Verdene Purchase, MD       heparin  ADULT infusion 100 units/mL (25000 units/250mL)  1,850 Units/hr Intravenous Continuous Gretel Prentice BIRCH, RPH 18.5 mL/hr at 10/14/24 0239 1,850 Units/hr at 10/14/24 0239   heparin  injection 1,000 Units  1,000 Units Intracatheter PRN Druscilla Bald, NP       hydrALAZINE  (APRESOLINE ) tablet 25 mg  25 mg Oral Q8H Howerter, Justin B, DO   25 mg at 10/14/24 0510   melatonin tablet 3 mg  3 mg Oral QHS PRN Howerter, Justin B, DO   3 mg at 10/13/24 2300   metoprolol  tartrate (LOPRESSOR ) tablet 25 mg  25 mg Oral BID Howerter, Justin B, DO   25 mg at 10/13/24 2300   ondansetron  (ZOFRAN ) injection 4  mg  4 mg Intravenous Q6H PRN Howerter, Justin B, DO       pantoprazole  (PROTONIX ) EC tablet 40 mg  40 mg Oral Daily Howerter, Justin B, DO       sacubitril-valsartan (ENTRESTO) 49-51 mg per tablet  1 tablet Oral BID Howerter, Justin B, DO         ALLERGIES Patient has no known allergies.  MEDICAL HISTORY Past Medical History:  Diagnosis Date   AKI (acute kidney injury) 08/30/2018   Anemia of chronic disease    Chronic systolic heart failure (HCC)    Diabetes mellitus without complication (HCC)    no meds  since weight loss   Dialysis patient    Mon, Wed, Fri   ESRD (end stage renal disease) (HCC)    on HD (M,W,F)   Hyperkalemia 02/04/2021   Hypertension    Metabolic acidosis, increased anion gap 02/04/2021   Paroxysmal atrial fibrillation (HCC)    Renal disorder    Sepsis without acute organ dysfunction (HCC) 11/30/2023     SOCIAL HISTORY Social History   Socioeconomic History   Marital status: Single    Spouse name: Not on file   Number of children: Not on file   Years of education: Not on file   Highest education level: Not on file  Occupational History   Not on file  Tobacco Use   Smoking status: Former    Types: Cigarettes   Smokeless tobacco: Never  Vaping Use   Vaping status: Never Used  Substance and Sexual Activity   Alcohol use: Yes    Alcohol/week: 7.0 standard drinks of alcohol    Types: 7 Cans of beer per week    Comment: no drinking 10/2023   Drug use: Not Currently    Types: Cocaine , Marijuana    Comment: 09/24/23 - Marijuana 3x/week. No cocaine  since Friday 08/27/2018   Sexual activity: Not on file  Other Topics Concern   Not on file  Social History Narrative   Not on file   Social Drivers of Health   Tobacco Use: Medium Risk (10/13/2024)   Patient History    Smoking Tobacco Use: Former    Smokeless Tobacco Use: Never    Passive Exposure: Not on file  Financial Resource Strain: Not on file  Food Insecurity: Patient Declined (10/14/2024)   Epic    Worried About Programme Researcher, Broadcasting/film/video in the Last Year: Patient declined    Barista in the Last Year: Patient declined  Transportation Needs: No Transportation Needs (10/14/2024)   Epic    Lack of Transportation (Medical): No    Lack of Transportation (Non-Medical): No  Physical Activity: Not on file  Stress: Not on file  Social Connections: Not on file  Intimate Partner Violence: Not At Risk (10/14/2024)   Epic    Fear of Current or Ex-Partner: No    Emotionally Abused: No    Physically Abused: No     Sexually Abused: No  Depression (PHQ2-9): Not on file  Alcohol Screen: Not on file  Housing: Low Risk (10/14/2024)   Epic    Unable to Pay for Housing in the Last Year: No    Number of Times Moved in the Last Year: 0    Homeless in the Last Year: No  Utilities: Not At Risk (10/14/2024)   Epic    Threatened with loss of utilities: No  Health Literacy: Not on file     FAMILY HISTORY Family History  Problem Relation Age  of Onset   Diabetes Mellitus II Sister    Diabetes Mellitus II Paternal Grandmother      Review of Systems: 12 systems were reviewed and negative except per HPI  Physical Exam: Vitals:   10/14/24 0451 10/14/24 0510  BP:  120/76  Pulse: 80   Temp: 98.9 F (37.2 C)   SpO2: 96%    No intake/output data recorded. No intake or output data in the 24 hours ending 10/14/24 0740 General: well-appearing, no acute distress HEENT: anicteric sclera, MMM CV: normal rate, no murmurs, no edema Lungs: bilateral chest rise, normal wob Abd: soft, non-tender, non-distended Skin: no visible lesions or rashes Psych: alert, engaged, appropriate mood and affect Neuro: normal speech, no gross focal deficits  Dialysis access: [facility] C/D/I  Test Results Reviewed Lab Results  Component Value Date   NA 137 10/12/2024   K 3.7 10/12/2024   CL 101 10/12/2024   CO2 23 10/12/2024   BUN 46 (H) 10/12/2024   CREATININE 5.30 (H) 10/12/2024   CALCIUM  9.1 10/12/2024   ALBUMIN  3.4 (L) 10/11/2024   PHOS 2.1 (L) 10/12/2024    I have reviewed relevant outside healthcare records

## 2024-10-14 NOTE — Progress Notes (Addendum)
 PHARMACY - ANTICOAGULATION CONSULT NOTE  Pharmacy Consult for Heparin  Indication: AF  Allergies[1]  Patient Measurements: Weight: 69.4 kg (152 lb 14.4 oz)  Vital Signs: Temp: 98.9 F (37.2 C) (01/02 0451) Temp Source: Oral (01/02 0451) BP: 120/76 (01/02 0510) Pulse Rate: 80 (01/02 0451)  Labs: Recent Labs    10/11/24 1536 10/12/24 0230 10/12/24 1307 10/13/24 0348 10/13/24 1019 10/13/24 2152 10/14/24 0829  HGB  --  7.3*  --  8.8*  --   --   --   HCT  --  22.9*  --  26.6*  --   --   --   PLT  --  154  --  161  --   --   --   APTT 40*  --   --   --   --   --   --   HEPARINUNFRC <0.10* <0.10*   < >  --  0.33 0.11* 0.18*  CREATININE  --  5.30*  --   --   --   --   --    < > = values in this interval not displayed.    Estimated Creatinine Clearance: 13.8 mL/min (A) (by C-G formula based on SCr of 5.3 mg/dL (H)).   Medical History: Past Medical History:  Diagnosis Date   AKI (acute kidney injury) 08/30/2018   Anemia of chronic disease    Chronic systolic heart failure (HCC)    Diabetes mellitus without complication (HCC)    no meds since weight loss   Dialysis patient    Mon, Wed, Fri   ESRD (end stage renal disease) (HCC)    on HD (M,W,F)   Hyperkalemia 02/04/2021   Hypertension    Metabolic acidosis, increased anion gap 02/04/2021   Paroxysmal atrial fibrillation (HCC)    Renal disorder    Sepsis without acute organ dysfunction (HCC) 11/30/2023    Medications:  Medications Prior to Admission  Medication Sig Dispense Refill Last Dose/Taking   [Paused] apixaban  (ELIQUIS ) 5 MG TABS tablet Take 1 tablet (5 mg total) by mouth 2 (two) times daily. 180 tablet 1    atorvastatin (LIPITOR) 40 MG tablet Take 1 tablet (40 mg total) by mouth daily.      blood glucose meter kit and supplies KIT Dispense based on patient and insurance preference. Use up to four times daily as directed. (FOR ICD-9 250.00, 250.01). 1 each 0    calcium  acetate (PHOSLO ) 667 MG capsule Take 2  capsules (1,334 mg total) by mouth 3 (three) times a week.      epoetin  alfa-epbx (RETACRIT ) 10000 UNIT/ML injection Inject 1 mL (10,000 Units total) into the vein every Monday, Wednesday, and Friday at 6 PM.      feeding supplement (ENSURE PLUS HIGH PROTEIN) LIQD Take 237 mLs by mouth 2 (two) times daily between meals.      heparin  25000 UT/250ML infusion Inject 1,350 Units/hr into the vein continuous.      hydrALAZINE  (APRESOLINE ) 25 MG tablet Take 25 mg by mouth 3 (three) times daily.      hydrOXYzine  (ATARAX ) 25 MG tablet Take 25 mg by mouth 2 (two) times daily.      metoprolol  tartrate (LOPRESSOR ) 25 MG tablet Take 1 tablet (25 mg total) by mouth 2 (two) times daily.      Multiple Vitamin (MULTIVITAMIN ADULT PO) Take 1 tablet by mouth daily.      omeprazole  (PRILOSEC) 20 MG capsule Take 20 mg by mouth every morning.  sacubitril-valsartan (ENTRESTO) 49-51 MG Take 1 tablet by mouth 2 (two) times daily. 60 tablet 3    tamsulosin  (FLOMAX ) 0.4 MG CAPS capsule Take 0.4 mg by mouth daily.       Assessment: 64 y/o M with a h/o AF on Eliquis  recently increased to 5 mg bid due to stroke risk (CHA2DS2/VAS of 3) but previously 2.5 mg bid subtherapeutic dose for cirrhosis/ESRD increased bleeding risk. LHC today after abnormal Myoview . Pharmacy consulted for heparin  bridging beginning 8 hours post sheath removal.    12/31 PM: Patient was transfused in setting of low Hgb (ESRD) and plans for cardiac surgery  10/14/24: Heparin  level 0.18, subtherapeutic at heparin  1850 units/hr. No issues with infusion running or signs of bleeding noted. Per RN, CBC unable to be collected this morning due to insufficient quantity of blood, patient refusing further blood draws at this time.     Goal of Therapy:  Heparin  level 0.3-0.7 units/ml aPTT 66-102 seconds Monitor platelets by anticoagulation protocol: Yes   Plan:  Increase heparin  to 2000 units/hr Check heparin  level in 8 hours Monitor heparin  level, CBC,  and s/sx of bleeding daily  Morna Breach, PharmD, BCPS PGY2 Cardiology Pharmacy Resident 10/14/2024 10:02 AM     [1] No Known Allergies

## 2024-10-14 NOTE — H&P (Signed)
 " History and Physical      [name] FMW:[id number] DOB: 1960-12-28 DOA: 10/13/2024; DOS: 10/14/2024  PCP: [facility]  Patient coming from: home   I have personally briefly reviewed patient's old medical records in [facility] Link  Chief Complaint: Right and left heart cath on 1230 showing severe multivessel disease  HPI: Casey Franco is a 64 y.o. 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 on 10/11/2024 following a right and left heart cath on 10/11/2024 showing severe multivessel disease.  The patient underwent outpatient nuclear medicine myocardial stress test on 09/20/2024 which was suggestive of some reversible ischemia.  He subsidy underwent elective outpatient right and left heart cath that [facility] on 10/11/2024, which showed severe multivessel disease, including distal left main and LAD.  Was admitted to hospitalist service at [facility] for further evaluation management of the above, with cardiology consulting.   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  Nephrology was consulted at [facility], throughout his hospitalization there was recently evaluating the patient here today.  He underwent ultrafiltration on 10/12/2024.  Balaji plans for her next titration to occur tomorrow, 10/14/2024.  Ultimately, multidisciplinary decision was made for transfer to [facility] for further evaluation and revascularization options of the patient's multivessel obstructive CAD.  He denies any chest pain throughout his stay at [facility], nor any current chest pain.  He conveys to me that he is currently asymptomatic, denying any shortness of breath,  diaphoresis, nausea, vomiting, palpitations.  While he is chronically anticoagulated on Eliquis  in the setting of a history of paroxysmal atrial fibrillation, Eliquis  has been held during his [facility] course, during which time the patient has been on heparin  drip.   Cardiology most recently saw the patient at [facility] earlier today, and recommended initiation of Entresto.  They also recommended goal hemoglobin of greater than 8.0.  The patient underwent transfusion of 1 unit PBC on 10/12/2024, with most recent hemoglobin found to be 8.8 when checked this morning, up from pretransfusion H&H of 7.3 on 10/12/2024.  Per chart review, most recent echocardiogram occurred on 08/11/2024 was notable for LVEF 35 to 40%, mild LVH, indeterminate diastolic parameters, severely reduced right ventricular systolic function, moderately dilated left atrium with severely dilated right atrium, moderate to severe mitral Lydia Meadows and mild to moderate tricuspid regurgitation.      Review of Systems: As per HPI otherwise 10 point review of systems negative.   Past Medical History:  Diagnosis Date   AKI (acute kidney injury) 08/30/2018   Anemia of chronic disease    Chronic systolic heart failure (HCC)    Diabetes mellitus without complication (HCC)    no meds since weight loss   Dialysis patient    Mon, Wed, Fri   ESRD (end stage renal disease) (HCC)    on HD (M,W,F)   Hyperkalemia 02/04/2021   Hypertension    Metabolic acidosis, increased anion gap 02/04/2021   Paroxysmal atrial fibrillation (HCC)    Renal disorder    Sepsis without acute organ dysfunction (HCC) 11/30/2023    Past Surgical History:  Procedure Laterality Date   CATARACT EXTRACTION W/PHACO Right 10/08/2023   Procedure: CATARACT EXTRACTION PHACO AND INTRAOCULAR LENS PLACEMENT (IOC)  RIGHT DIABETIC 12.19 01:08.8;  Surgeon: Enola Feliciano Hugger, MD;  Location: [facility];  Service: Ophthalmology;  Laterality: Right;   CATARACT  EXTRACTION W/PHACO Left 10/22/2023   Procedure: CATARACT EXTRACTION PHACO AND INTRAOCULAR LENS PLACEMENT (IOC) LEFT DIABETIC MALYUGIN;  Surgeon: Enola Feliciano Hugger, MD;  Location: [facility];  Service: Ophthalmology;  Laterality: Left;  7.66 0:54.2   COLONOSCOPY WITH PROPOFOL  N/A 11/21/2021   Procedure: COLONOSCOPY WITH PROPOFOL ;  Surgeon: Unk Corinn Skiff, MD;  Location: [facility] ENDOSCOPY;  Service: Gastroenterology;  Laterality: N/A;   DIALYSIS/PERMA CATHETER INSERTION Right 05/20/2024   Procedure: DIALYSIS/PERMA CATHETER INSERTION;  Surgeon: Marea Selinda RAMAN, MD;  Location: [facility] INVASIVE CV LAB;  Service: Cardiovascular;  Laterality: Right;   DIALYSIS/PERMA CATHETER REPAIR N/A 12/15/2023   Procedure: DIALYSIS/PERMA CATHETER REPAIR;  Surgeon: Jama Cordella MATSU, MD;  Location: [facility] INVASIVE CV LAB;  Service: Cardiovascular;  Laterality: N/A;   ESOPHAGOGASTRODUODENOSCOPY N/A 03/03/2024   Procedure: EGD (ESOPHAGOGASTRODUODENOSCOPY);  Surgeon: Unk Corinn Skiff, MD;  Location: [facility] ENDOSCOPY;  Service: Gastroenterology;  Laterality: N/A;   ESOPHAGOGASTRODUODENOSCOPY (EGD) WITH PROPOFOL  N/A 11/21/2021   Procedure: ESOPHAGOGASTRODUODENOSCOPY (EGD) WITH PROPOFOL ;  Surgeon: Unk Corinn Skiff, MD;  Location: [facility] ENDOSCOPY;  Service: Gastroenterology;  Laterality: N/A;   INSERTION OF DIALYSIS CATHETER     LEG SURGERY  1987   RIGHT/LEFT HEART CATH AND CORONARY ANGIOGRAPHY N/A 10/11/2024   Procedure: RIGHT/LEFT HEART CATH AND CORONARY ANGIOGRAPHY;  Surgeon: Mady Bruckner, MD;  Location: [facility] INVASIVE CV LAB;  Service: Cardiovascular;  Laterality: N/A;    Social History:  reports that he has quit smoking. His smoking use included cigarettes. He has never used smokeless tobacco. He reports current alcohol use of about 7.0 standard drinks of alcohol per week. He reports that he does not currently use drugs after having used the following drugs: Cocaine  and Marijuana.   Allergies[1]  Family History   Problem Relation Age of Onset   Diabetes Mellitus II Sister    Diabetes Mellitus II Paternal Grandmother     Prior to Admission medications  Medication Sig Start Date End Date Taking? Authorizing Provider  [Paused] apixaban  (ELIQUIS ) 5 MG TABS tablet Take 1 tablet (5 mg total) by mouth 2 (two) times daily. Wait to take this until your doctor or other care provider tells you to start again. 08/16/24   Gollan, Timothy J, MD  atorvastatin (LIPITOR) 40 MG tablet Take 1 tablet (40 mg total) by mouth daily. 10/13/24   Lenon Marien CROME, MD  blood glucose meter kit and supplies KIT Dispense based on patient and insurance preference. Use up to four times daily as directed. (FOR ICD-9 250.00, 250.01). 09/01/18   Tobie Press, MD  calcium  acetate (PHOSLO ) 667 MG capsule Take 2 capsules (1,334 mg total) by mouth 3 (three) times a week. 10/14/24   Lenon Marien CROME, MD  epoetin  alfa-epbx (RETACRIT ) 10000 UNIT/ML injection Inject 1 mL (10,000 Units total) into the vein every Monday, Wednesday, and Friday at 6 PM. 10/12/24   Lenon Marien CROME, MD  feeding supplement (ENSURE PLUS HIGH PROTEIN) LIQD Take 237 mLs by mouth 2 (two) times daily between meals. 10/12/24   Lenon Marien CROME, MD  heparin  25000 UT/250ML infusion Inject 1,350 Units/hr into the vein continuous. 10/12/24   Lenon Marien CROME, MD  hydrALAZINE  (APRESOLINE ) 25 MG tablet Take 25 mg by mouth 3 (three) times daily.    [provider]  hydrOXYzine  (ATARAX ) 25 MG tablet Take 25 mg by mouth 2 (two) times daily. 09/22/24  [provider]  metoprolol  tartrate (LOPRESSOR ) 25 MG tablet Take 1 tablet (25 mg total) by mouth 2 (two) times daily. 10/12/24   Anderson, Chelsey L, MD  Multiple Vitamin (MULTIVITAMIN ADULT PO) Take 1 tablet by mouth daily.    [provider]  omeprazole  (PRILOSEC) 20 MG capsule Take 20 mg by mouth every morning. 07/02/24   [provider]  sacubitril-valsartan (ENTRESTO) 49-51 MG Take  1 tablet by mouth 2 (two) times daily. 10/13/24   Gerard Frederick, NP  tamsulosin  (FLOMAX ) 0.4 MG CAPS capsule Take 0.4 mg by mouth daily. 09/19/24   [provider]     Objective    Physical Exam: Vitals:   10/13/24 1920 10/13/24 1955 10/13/24 2046 10/13/24 2300  BP: (!) (P) 165/99   134/83  Pulse:  77  80  Temp: (P) 98.2 F (36.8 C) 98.2 F (36.8 C)    TempSrc: (P) Oral Oral    SpO2:  98% 98%     General: appears to be stated age; alert, oriented Skin: warm, dry, no rash Head:  AT/[place] Mouth:  Oral mucosa membranes appear moist, normal dentition Neck: supple; trachea midline Heart:  RRR; 2/6 holosystolic murmur noted;  Lungs: CTAB, did not appreciate any wheezes, rales, or rhonchi Abdomen: + BS; soft, ND, NT Vascular: 2+ pedal pulses b/l; 2+ radial pulses b/l Extremities: no peripheral edema, no muscle wasting        Labs on Admission: I have personally reviewed following labs and imaging studies  CBC: Recent Labs  Lab 10/11/24 0841 10/11/24 0842 10/12/24 0230 10/13/24 0348  WBC  --   --  4.9 6.4  HGB 8.8* 8.2* 7.3* 8.8*  HCT 26.0* 24.0* 22.9* 26.6*  MCV  --   --  90.5 89.6  PLT  --   --  154 161   Basic Metabolic Panel: Recent Labs  Lab 10/11/24 0841 10/11/24 0842 10/12/24 0230 10/12/24 1307  NA 140 140 137  --   K 3.6 3.6 3.7  --   CL  --   --  101  --   CO2  --   --  23  --   GLUCOSE  --   --  98  --   BUN  --   --  46*  --   CREATININE  --   --  5.30*  --   CALCIUM   --   --  9.1  --   PHOS  --   --   --  2.1*   GFR: Estimated Creatinine Clearance: 13.6 mL/min (A) (by C-G formula based on SCr of 5.3 mg/dL (H)). Liver Function Tests: Recent Labs  Lab 10/11/24 1536  AST 22  ALT 7  ALKPHOS 151*  BILITOT 0.6  PROT 8.1  ALBUMIN  3.4*   No results for input(s): LIPASE, AMYLASE in the last 168 hours. No results for input(s): AMMONIA in the last 168 hours. Coagulation Profile: No results for input(s): INR, PROTIME in the last  168 hours. Cardiac Enzymes: No results for input(s): CKTOTAL, CKMB, CKMBINDEX, TROPONINI in the last 168 hours. BNP (last 3 results) No results for input(s): PROBNP in the last 8760 hours. HbA1C: No results for input(s): HGBA1C in the last 72 hours. CBG: No results for input(s): GLUCAP in the last 168 hours. Lipid Profile: No results for input(s): CHOL, HDL, LDLCALC, TRIG, CHOLHDL, LDLDIRECT in the last 72 hours. Thyroid Function Tests: No results for input(s): TSH, T4TOTAL, FREET4, T3FREE, THYROIDAB in the last 72 hours. Anemia  Panel: Recent Labs    10/11/24 1536  FERRITIN 794*  TIBC 239*  IRON 85  RETICCTPCT 2.4   Urine analysis:    Component Value Date/Time   COLORURINE YELLOW (A) 08/30/2018 1810   APPEARANCEUR CLEAR (A) 08/30/2018 1810   LABSPEC 1.012 08/30/2018 1810   PHURINE 6.0 08/30/2018 1810   GLUCOSEU 150 (A) 08/30/2018 1810   HGBUR NEGATIVE 08/30/2018 1810   BILIRUBINUR NEGATIVE 08/30/2018 1810   KETONESUR NEGATIVE 08/30/2018 1810   PROTEINUR >=300 (A) 08/30/2018 1810   NITRITE NEGATIVE 08/30/2018 1810   LEUKOCYTESUR NEGATIVE 08/30/2018 1810    Radiological Exams on Admission: No results found.    Assessment/Plan   Principal Problem:   Acute on chronic systolic heart failure (HCC) Active Problems:   End-stage renal disease on hemodialysis (HCC)   GERD (gastroesophageal reflux disease)   Paroxysmal atrial fibrillation (HCC)   CAD (coronary artery disease)   History of anemia due to chronic kidney disease       # (Acute on chronic biventricular systolic heart failure: Will to be volume overloaded at [facility], the context of an known history of chronic biventricular systolic heart failure, with echo in October 2025 notable for LVEF 35 to 40%, and severely reduced right ventricular systolic function.  In the setting of end-stage renal disease on hemodialysis, he underwent UF on 12/31, with nephrology plans for  additional UF tomorrow, 10/14/2024.  At the time of my encounter with him following arrival at [facility], he is asymptomatic, denies any current shortness of breath, and does not appear to be in any acute respiratory distress, maintaining oxygen saturations in the high 90s to 100% on room air.  Plan: Will initiate Entresto, per recommendation of [facility] cardiology, as above.  For now, continue outpatient metoprolol  to tartrate, with cardiology recommendation to ultimately consider transition to Coreg versus metoprolol  succinate.  Continue existing hydralazine .  Monitor strict I's and O's and daily weights.  Have also contacted on-call nephrology requesting consult the morning for next HD/UF session.  Check proBNP in the morning.                     # )  coronary artery disease: With right/left heart cath on 10/11/2024 showing severe multivessel disease, including obstructive disease in the distal left main as well as LAD.  Transferred to [facility] for further evaluation of revascularization options.  Denies any recent chest pain.  Currently asymptomatic.  Will continue the heparin  drip upon which the patient has been We will at [facility].  Plan: Continue heparin  drip.  CBC in the morning.  Continue beta-blocker, Entresto, as above.  Continue high intensity atorvastatin.  I have contacted cardiology master requesting cardiology consult to occur in the morning.                        #) ESRD: on HD (schedule: Monday, Wednesday, Friday).  [facility] nephrology's plan was for HD/UF to occur tomorrow, 10/14/24.    Plan: monitor strict I's/O's, daily weights. CMP in the AM. Check mag and phos levels.  I have contacted on-call nephrology, requestingDr. Marlee, assistance with pursuing nephrology consult to occur in the morning for arrangements for HD/UF, as above.                            #) Paroxysmal atrial fibrillation: Documented history of such. In setting  of CHA2DS2-VASc score greater than 2, there is  an indication for chronic anticoagulation for thromboembolic prophylaxis. Consistent with this, patient is chronically anticoagulated on  eliquis , although the latter has been held at [facility] for heparin  drip. Home AV nodal blocking regimen: Metoprolol  tartrate.  Most recent echocardiogram occurred in October 2025 with results as above.   Plan: monitor strict I's & O's and daily weights. CMP/CBC in AM. Check serum mag level. Continue home AV nodal blocking regimen.  Will continue to hold home Eliquis  on existing heparin  drip.  Monitor on telemetry.                    #) Anemia of chronic kidney disease: Documented history of such, a/w with baseline hgb range 7-10, with presenting hgb consistent with this range, in the absence of any overt evidence of active bleed.  Per review of cardiology documentation from their consult at [facility], cardiology recommends goal hemoglobin of greater than 8 in the setting of the patient's obstructive coronary artery disease.  He underwent transfusion 1 unit PBC on 10/12/2024, with most recent hemoglobin noted to be 8.8, as further detailed above.   Plan: Repeat CBC in the morning.  Check updated type and screen.                          #) GERD: documented h/o such; on Protonix  as outpatient.   Plan: continue home PPI.           DVT prophylaxis: SCD's + heparin  drip  Code Status: Full code Family Communication: none Disposition Plan: Per Rounding Team Consults called: I have requested cardiology and nephrology consults, as further detailed above;  Admission status: inpatient     I SPENT GREATER THAN 75  MINUTES IN CLINICAL CARE TIME/MEDICAL DECISION-MAKING IN COMPLETING THIS ADMISSION.      Leiani Enright B Judaea Burgoon DO [facility]  From 7PM - 7AM       [1] No Known Allergies  "

## 2024-10-14 NOTE — Progress Notes (Signed)
 Pt receives out-pt HD at [facility] on MWF with 6:45 am chair time. Will assist as needed.  Randine Mungo Dialysis Navigator [phone]

## 2024-10-14 NOTE — Progress Notes (Signed)
 "  [facility] PROGRESS NOTE   FLEET HIGHAM FMW:[id number] DOB: July 13, 1961 DOA: 10/13/2024  PCP: [facility]  Brief History: 64 y.o. male with a PMH significant for chronic HFrEF with LVEF 50% on most recent stress test, ESRD on HD MWF, HTN, HLD, PAF on Eliquis , hepatitis C and liver cirrhosis, presented with worsening of exertional dyspnea. Underwent stress test recently which showed a moderate size severe partially reversible defect involving the anterior wall apex and apical inferior segment consistent with scar and peri-infarct ischemia. LVEF 51%. Presented to [facility] for elective LHC and RHC. Cath found L main stenosis.  Patient was subsequently transferred to [facility] for cardiothoracic surgery input.  Consultants: Cardiology.  Cardiothoracic surgery.  Nephrology  Procedures: Cardiac catheterization    Subjective/Interval History: Patient denies any chest pain or shortness of breath this morning.  Denies nausea vomiting.    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  Patient was transferred to [facility] for cardiothoracic input. Patient is noted to be on IV heparin .  Patient is on metoprolol .  Currently not on any antiplatelet agents. Await cardiothoracic surgery input.  Acute on chronic biventricular CHF Echo done in October 2025 showed LVEF of 35 to 40% with severely reduced right ventricular systolic function as well.  Patient was started on Entresto by cardiology.  Volume is being managed primarily with hemodialysis. Also noted to be on hydralazine  and metoprolol . Further management per cardiology.  End-stage renal disease on hemodialysis Usually dialyzed Monday Wednesday Friday. Nephrology is aware of this patient.  Underwent ultrafiltration yesterday but it does not appear that he had volume removed  yesterday.  Paroxysmal atrial fibrillation Stable.  Was on Eliquis  prior to admission.  Currently on IV heparin .  On metoprolol .  Anemia of chronic disease Hemoglobin noted to be low but stable.  No evidence of overt bleeding.  History of liver cirrhosis/hepatitis C Compensated.  DVT Prophylaxis: On IV heparin  Code Status: Full code Family Communication: Discussed with patient Disposition Plan: To be determined  Status is: Inpatient Remains inpatient appropriate because: Coronary artery disease/left main disease      Medications: Scheduled:  atorvastatin  40 mg Oral Daily   Chlorhexidine  Gluconate Cloth  6 each Topical Daily   Chlorhexidine  Gluconate Cloth  6 each Topical Q0600   hydrALAZINE   25 mg Oral Q8H   metoprolol  tartrate  25 mg Oral BID   pantoprazole   40 mg Oral Daily   sacubitril-valsartan  1 tablet Oral BID   Continuous:  heparin  1,850 Units/hr (10/14/24 0239)   PRN:acetaminophen  **OR** acetaminophen , alteplase , heparin , melatonin, ondansetron  (ZOFRAN ) IV  Antibiotics: Anti-infectives (From admission, onward)    None       Objective:  Vital Signs  Vitals:   10/13/24 2046 10/13/24 2300 10/14/24 0451 10/14/24 0510  BP:  134/83  120/76  Pulse:  80 80   Temp:   98.9 F (37.2 C)   TempSrc:   Oral   SpO2: 98%  96%   Weight:   69.4 kg    No intake or output data in the 24 hours ending 10/14/24 0937 Filed Weights   10/14/24 0451  Weight: 69.4 kg    General appearance: Awake alert.  In no distress Resp: Clear to auscultation bilaterally.  Normal effort Cardio: S1-S2 is normal regular.  No S3-S4.  No rubs murmurs or bruit GI: Abdomen is soft.  Nontender nondistended.  Bowel sounds  are present normal.  No masses organomegaly Extremities: No edema.  Full range of motion of lower extremities. Neurologic: Alert and oriented x3.  No focal neurological deficits.    Lab Results:  Data Reviewed: I have personally reviewed following labs and reports  of the imaging studies  CBC: Recent Labs  Lab 10/11/24 0841 10/11/24 0842 10/12/24 0230 10/13/24 0348  WBC  --   --  4.9 6.4  HGB 8.8* 8.2* 7.3* 8.8*  HCT 26.0* 24.0* 22.9* 26.6*  MCV  --   --  90.5 89.6  PLT  --   --  154 161    Basic Metabolic Panel: Recent Labs  Lab 10/11/24 0841 10/11/24 0842 10/12/24 0230 10/12/24 1307  NA 140 140 137  --   K 3.6 3.6 3.7  --   CL  --   --  101  --   CO2  --   --  23  --   GLUCOSE  --   --  98  --   BUN  --   --  46*  --   CREATININE  --   --  5.30*  --   CALCIUM   --   --  9.1  --   PHOS  --   --   --  2.1*    GFR: Estimated Creatinine Clearance: 13.8 mL/min (A) (by C-G formula based on SCr of 5.3 mg/dL (H)).  Liver Function Tests: Recent Labs  Lab 10/11/24 1536  AST 22  ALT 7  ALKPHOS 151*  BILITOT 0.6  PROT 8.1  ALBUMIN  3.4*    Anemia Panel: Recent Labs    10/11/24 1536  FERRITIN 794*  TIBC 239*  IRON 85  RETICCTPCT 2.4    No results found for this or any previous visit (from the past 240 hours).    Radiology Studies: No results found.     LOS: 1 day   Fred Franzen  [facility] Pager on [email or web address]  10/14/2024, 9:37 AM   "

## 2024-10-14 NOTE — Consult Note (Signed)
 "   Advanced Heart Failure Team Consult Note   Primary Physician: [facility] Cardiologist:  Timothy Gollan, MD HPI:    Casey Franco is seen today for evaluation of CHF at the request of Dr. Wendel.   Casey Franco is a 64 y.o. male with history of ESRD on HD, HCV with cirrhosis and history of ascites/varices, PAF, CAD, and ischemic cardiomyopathy.  Patient had episode of subacute bacterial peritonitis in 3/25.  At that time, troponin was noted to be elevated and he was in atrial fibrillation.  Echo showed EF 40-45%, RV dysfunction, moderate MR.  Repeat echo in 10/25 showed EF 35-40%, global HK, severe RV dysfunction, moderate-severe MR.  He had a Cardiolite in 12/25 showing a partially reversible anterior defect.  On 10/11/24, he was brought in for outpatient LHC.  This showed 75% LM stenosis, 75% ostial LAD, occluded D2, and 99% mLAD.  RHC showed markedly elevated filling pressures bilaterally with preserved CI 3.6.  He was admitted for consideration of revascularization.  At Heart Team meeting today, he was determined not to be a candidate for PCI. He is being evaluated for CABG.   Patient denies chest pain. He has been in NSR here.  Despite significant volume overload, he has not been particularly symptomatic.  He denies dyspnea with usual activities but fatigues easily.  Orthopnea in past but not currently.    Home Medications Prior to Admission medications  Medication Sig Start Date End Date Taking? Authorizing Provider  [Paused] apixaban  (ELIQUIS ) 5 MG TABS tablet Take 1 tablet (5 mg total) by mouth 2 (two) times daily. Wait to take this until your doctor or other care provider tells you to start again. 08/16/24   Gollan, Timothy J, MD  atorvastatin (LIPITOR) 40 MG tablet Take 1 tablet (40 mg total) by mouth daily. 10/13/24   Lenon Marien CROME, MD  blood glucose meter kit and supplies KIT Dispense based on patient and insurance preference. Use up to four times  daily as directed. (FOR ICD-9 250.00, 250.01). 09/01/18   Tobie Press, MD  calcium  acetate (PHOSLO ) 667 MG capsule Take 2 capsules (1,334 mg total) by mouth 3 (three) times a week. 10/14/24   Lenon Marien CROME, MD  epoetin  alfa-epbx (RETACRIT ) 10000 UNIT/ML injection Inject 1 mL (10,000 Units total) into the vein every Monday, Wednesday, and Friday at 6 PM. 10/12/24   Lenon Marien CROME, MD  feeding supplement (ENSURE PLUS HIGH PROTEIN) LIQD Take 237 mLs by mouth 2 (two) times daily between meals. 10/12/24   Lenon Marien CROME, MD  heparin  25000 UT/250ML infusion Inject 1,350 Units/hr into the vein continuous. 10/12/24   Lenon Marien CROME, MD  hydrALAZINE  (APRESOLINE ) 25 MG tablet Take 25 mg by mouth 3 (three) times daily.    [provider]  hydrOXYzine  (ATARAX ) 25 MG tablet Take 25 mg by mouth 2 (two) times daily. 09/22/24   [provider]  metoprolol  tartrate (LOPRESSOR ) 25 MG tablet Take 1 tablet (25 mg total) by mouth 2 (two) times daily. 10/12/24   Anderson, Chelsey L, MD  Multiple Vitamin (MULTIVITAMIN ADULT PO) Take 1 tablet by mouth daily.    [provider]  omeprazole  (PRILOSEC) 20 MG capsule Take 20 mg by mouth every morning. 07/02/24   [provider]  sacubitril-valsartan (ENTRESTO) 49-51 MG Take 1 tablet by mouth 2 (two) times daily. 10/13/24   Gerard Frederick, NP  tamsulosin  (FLOMAX ) 0.4 MG CAPS capsule Take 0.4 mg by mouth daily. 09/19/24  [provider]    Past Medical History: Past Medical History:  Diagnosis Date   AKI (acute kidney injury) 08/30/2018   Anemia of chronic disease    Chronic systolic heart failure (HCC)    Diabetes mellitus without complication (HCC)    no meds since weight loss   Dialysis patient    Mon, Wed, Fri   ESRD (end stage renal disease) (HCC)    on HD (M,W,F)   Hyperkalemia 02/04/2021   Hypertension    Metabolic acidosis, increased anion gap 02/04/2021   Paroxysmal atrial fibrillation (HCC)     Renal disorder    Sepsis without acute organ dysfunction (HCC) 11/30/2023    Past Surgical History: Past Surgical History:  Procedure Laterality Date   CATARACT EXTRACTION W/PHACO Right 10/08/2023   Procedure: CATARACT EXTRACTION PHACO AND INTRAOCULAR LENS PLACEMENT (IOC) RIGHT DIABETIC 12.19 01:08.8;  Surgeon: Enola Feliciano Hugger, MD;  Location: [facility];  Service: Ophthalmology;  Laterality: Right;   CATARACT EXTRACTION W/PHACO Left 10/22/2023   Procedure: CATARACT EXTRACTION PHACO AND INTRAOCULAR LENS PLACEMENT (IOC) LEFT DIABETIC MALYUGIN;  Surgeon: Enola Feliciano Hugger, MD;  Location: [facility];  Service: Ophthalmology;  Laterality: Left;  7.66 0:54.2   COLONOSCOPY WITH PROPOFOL  N/A 11/21/2021   Procedure: COLONOSCOPY WITH PROPOFOL ;  Surgeon: Unk Corinn Skiff, MD;  Location: [facility];  Service: Gastroenterology;  Laterality: N/A;   DIALYSIS/PERMA CATHETER INSERTION Right 05/20/2024   Procedure: DIALYSIS/PERMA CATHETER INSERTION;  Surgeon: Marea Selinda RAMAN, MD;  Location: [facility];  Service: Cardiovascular;  Laterality: Right;   DIALYSIS/PERMA CATHETER REPAIR N/A 12/15/2023   Procedure: DIALYSIS/PERMA CATHETER REPAIR;  Surgeon: Jama Cordella MATSU, MD;  Location: [facility];  Service: Cardiovascular;  Laterality: N/A;   ESOPHAGOGASTRODUODENOSCOPY N/A 03/03/2024   Procedure: EGD (ESOPHAGOGASTRODUODENOSCOPY);  Surgeon: Unk Corinn Skiff, MD;  Location: [facility];  Service: Gastroenterology;  Laterality: N/A;   ESOPHAGOGASTRODUODENOSCOPY (EGD) WITH PROPOFOL  N/A 11/21/2021   Procedure: ESOPHAGOGASTRODUODENOSCOPY (EGD) WITH PROPOFOL ;  Surgeon: Unk Corinn Skiff, MD;  Location: [facility];  Service: Gastroenterology;  Laterality: N/A;   INSERTION OF DIALYSIS CATHETER     LEG SURGERY  1987   RIGHT/LEFT HEART CATH AND CORONARY ANGIOGRAPHY N/A 10/11/2024   Procedure: RIGHT/LEFT HEART CATH AND CORONARY ANGIOGRAPHY;  Surgeon: Mady Bruckner, MD;  Location: [facility];  Service: Cardiovascular;  Laterality: N/A;    Family History: Family History  Problem Relation Age of Onset   Diabetes Mellitus II Sister    Diabetes Mellitus II Paternal Grandmother     Social History: Social History   Socioeconomic History   Marital status: Single    Spouse name: Not on file   Number of children: Not on file   Years of education: Not on file   Highest education level: Not on file  Occupational History   Not on file  Tobacco Use   Smoking status: Former    Types: Cigarettes   Smokeless tobacco: Never  Vaping Use   Vaping status: Never Used  Substance and Sexual Activity   Alcohol use: Yes    Alcohol/week: 7.0 standard drinks of alcohol    Types: 7 Cans of beer per week    Comment: no drinking 10/2023   Drug use: Not Currently    Types: Cocaine , Marijuana    Comment: 09/24/23 - Marijuana 3x/week. No cocaine  since Friday 08/27/2018   Sexual activity: Not on file  Other Topics Concern   Not on file  Social History Narrative  Not on file   Social Drivers of Health   Tobacco Use: Medium Risk (10/13/2024)   Patient History    Smoking Tobacco Use: Former    Smokeless Tobacco Use: Never    Passive Exposure: Not on file  Financial Resource Strain: Not on file  Food Insecurity: Patient Declined (10/14/2024)   Epic    Worried About Programme Researcher, Broadcasting/film/video in the Last Year: Patient declined    Barista in the Last Year: Patient declined  Transportation Needs: No Transportation Needs (10/14/2024)   Epic    Lack of Transportation (Medical): No    Lack of Transportation (Non-Medical): No  Physical Activity: Not on file  Stress: Not on file  Social Connections: Not on file  Depression (EYV7-0): Not on file  Alcohol Screen: Not on file  Housing: Low Risk (10/14/2024)   Epic    Unable to Pay for Housing in the Last Year: No    Number of Times Moved in the Last Year: 0    Homeless in the Last Year: No   Utilities: Not At Risk (10/14/2024)   Epic    Threatened with loss of utilities: No  Health Literacy: Not on file    Allergies:  Allergies[1]  Objective:   Vital Signs:   Temp:  [98.2 F (36.8 C)-98.9 F (37.2 C)] 98.9 F (37.2 C) (01/02 0451) Pulse Rate:  [73-80] 80 (01/02 0451) Resp:  [20] 20 (01/01 1807) BP: (120-165)/(76-99) 120/76 (01/02 0510) SpO2:  [96 %-98 %] 96 % (01/02 0451) FiO2 (%):  [21 %] 21 % (01/01 2046) Weight:  [69.4 kg] 69.4 kg (01/02 0451) Last BM Date : 10/14/24  Weight change: Filed Weights   10/14/24 0451  Weight: 69.4 kg    Intake/Output:  No intake or output data in the 24 hours ending 10/14/24 1156  Physical Exam  General: NAD Neck: JVP 16+, no thyromegaly or thyroid nodule.  Lungs: Clear to auscultation bilaterally with normal respiratory effort. CV: Nondisplaced PMI.  Heart regular S1/S2, no S3/S4, 2/6 HSM apex.  No peripheral edema.  No carotid bruit.  Unable to pedal pulses.  Abdomen: Soft, nontender, no hepatosplenomegaly, moderate distention.  Skin: Intact without lesions or rashes.  Neurologic: Alert and oriented x 3.  Psych: Normal affect. Extremities: No clubbing or cyanosis.  HEENT: Normal.   Telemetry   NSR 80s (personally reviewed)  EKG    NSR, LVH with repolarization abnormality, possible old inferior MI.   Labs   Basic Metabolic Panel: Recent Labs  Lab 10/11/24 0841 10/11/24 0842 10/12/24 0230 10/12/24 1307 10/14/24 1044  NA 140 140 137  --  134*  K 3.6 3.6 3.7  --  3.6  CL  --   --  101  --  98  CO2  --   --  23  --  22  GLUCOSE  --   --  98  --  123*  BUN  --   --  46*  --  47*  CREATININE  --   --  5.30*  --  5.62*  CALCIUM   --   --  9.1  --  8.9  MG  --   --   --   --  1.9  PHOS  --   --   --  2.1* 4.1    Liver Function Tests: Recent Labs  Lab 10/11/24 1536 10/14/24 1044  AST 22 19  ALT 7 8  ALKPHOS 151* 141*  BILITOT 0.6 0.6  PROT  8.1 7.7  ALBUMIN  3.4* 3.3*   No results for input(s):  LIPASE, AMYLASE in the last 168 hours. No results for input(s): AMMONIA in the last 168 hours.  CBC: Recent Labs  Lab 10/11/24 0841 10/11/24 0842 10/12/24 0230 10/13/24 0348 10/14/24 1044  WBC  --   --  4.9 6.4 6.9  NEUTROABS  --   --   --   --  5.0  HGB 8.8* 8.2* 7.3* 8.8* 9.3*  HCT 26.0* 24.0* 22.9* 26.6* 28.4*  MCV  --   --  90.5 89.6 90.7  PLT  --   --  154 161 172    Cardiac Enzymes: No results for input(s): CKTOTAL, CKMB, CKMBINDEX, TROPONINI in the last 168 hours.  BNP: BNP (last 3 results) No results for input(s): BNP in the last 8760 hours.  ProBNP (last 3 results) Recent Labs    10/14/24 1044  PROBNP >35,000.0*     CBG: No results for input(s): GLUCAP in the last 168 hours.  Coagulation Studies: No results for input(s): LABPROT, INR in the last 72 hours.   Imaging   No results found.  Medications:   Current Medications:  aspirin   81 mg Oral Daily   atorvastatin  40 mg Oral Daily   Chlorhexidine  Gluconate Cloth  6 each Topical Daily   Chlorhexidine  Gluconate Cloth  6 each Topical Q0600   hydrALAZINE   25 mg Oral Q8H   metoprolol  tartrate  25 mg Oral BID   pantoprazole   40 mg Oral Daily   sacubitril-valsartan  1 tablet Oral BID    Infusions:  heparin  2,000 Units/hr (10/14/24 1033)    Assessment/Plan   1. CAD: LHC on 10/11/24 showed 75% LM stenosis, 75% ostial LAD, occluded D2, and 99% mLAD.   Not an ACS situation.  He had a Cardiolite with partially reversible anterior defect.  No chest pain.  Discussed by Heart team this morning, not thought to be a good PCI candidate.  TCTS to consult, but he will be very high risk for CABG with RV failure and quite significant volume overload currently, ESRD, and cirrhosis from HCV.  With his comorbidities and no chest pain/ACS, may be best to manage medically.  - Continue ASA 81  - Continue statin, check lipids in am.  2. Acute on chronic systolic CHF: Echo in 10/25 showed EF 35-40%,  global HK, severe RV dysfunction and severe RV enlargement, moderate-severe MR.  Suspect ischemic cardiomyopathy.  RHC on 12/30 showed mean RA 33, PA 78/40, mean PCWP 32, CI 3.6.  Marked volume overload with biventricular failure.  Patient is quite volume overloaded on exam.  Surprisingly, he is minimally symptomatic.   - Patient needs a lot of fluid off.  I spoke with Dr. Dennise, we can start with trying aggressive UF with HD and dialyze daily for several sessions.  However, would have low threshold to transfer to CCU for CVVH to pull off fluid more aggressively.  - Can continue Entresto 49/51 bid.  - BP elevated, increase hydralazine  to 50 tid and add Imdur 30 mg daily.  - Stop metoprolol  tartrate with low EF and marked volume overload.  3. ESRD: As above, markedly volume overloaded and at minimum needs daily HD with aggressive UF, low threshold to transfer to CCU for CVVH.  4. Cirrhosis: From HCV.  Suspect ascites with distended abdomen.  RV failure and volume overload likely plays a role as well with liver congestion  - Will get abdominal US  to assess for ascites, ?paracentesis.  5. Atrial fibrillation: Paroxysmal.  He is on heparin  gtt currently.  He is in NSR.  Poor amiodarone candidate with cirrhosis.  - Can eventually transition back to Eliquis .  6. HTN: Adjusting meds as above.   Length of Stay: 1  Ezra Shuck, MD  10/14/2024, 11:56 AM  Advanced Heart Failure Team Pager [phone] (M-F; 7a - 5p)   Please visit [email or web address]: For overnight coverage please call cardiology fellow first. If fellow not available call Shock/ECMO MD on call.  For ECMO / Mechanical Support (Impella, IABP, LVAD) issues call Shock / ECMO MD on call.      [1] No Known Allergies  "

## 2024-10-14 NOTE — Consult Note (Addendum)
 "     [address]       [place] [address]             [phone]        Casey Franco [facility] Medical Record #[id number] Date of Birth: Feb 12, 1961  Referring: Lenon Marien CROME, MD/Christopher End MD Primary Care: [facility] Primary Cardiologist:Timothy Warr [place]res, MD  Chief Complaint:  CAD  History of Present Illness:      Casey Franco is a 65 yo male with known history of HTN, DM diet controlled, ESRD on dialysis, bradycardia, anemia of chronic disease, hepatitis C, liver cirrhosis secondary to HCV, ascites, varices, and heart failure.  The patient presented in March of 2025 with several week complaint of abdominal pain.  He was noted to have bacterial peritonitis and was treated with antibiotics.  He was noted to have elevated Troponin levels at that time, but it was felt to be demand ischemia.  Also during that admission he developed Atrial Fibrillation and was started on lopressor , DOAC was started at subtherapeutic dosing due to increase risk of bleeding.  Echocardiogram at that time showed a reduced EF of 40-45% with reduced RV function, enlarged RV, increased LV wall thickness, pericardial effusion, and moderate MR.  He was recently evaluated by Dr. Gollan in August at which time he was feeling fairly well with some orthopnea.  He was no longer taking Apixiban due to bleeding form his dialysis catheter site.  EKG at that time revealed the patient to be in rate controlled Atrial Flutter.  He underwent repeat Echocardiogram on 08/01/2024 which showed further decrease in EF to 35-40% with global hypokinesis, mild LVH, severely reduced RV function with moderate to severe MR.  Due to this it was recommended he resume Eliquis  at 5 mg BID with plan for cardioversion in several weeks.  He was again evaluated in the Cardiology clinic on 08/30/2024 when he again felt he was doing well.  He did note a decrease in energy level as he recently started walking for  exercise.  He also noticed he was more short of breath with walking.  It was recommended he undergo placement of ZIO patch for 2 weeks and a Myoview  stress test.  This showed a moderate, partially reversible defect in the anterior wall, apex, and apical inferior segment consistent with scar and peri-infarct ischemia.  He was noted to have mildly reduced EF of 51%, moderate size right pleural effusion was also noted.  Due to this he was set up for elective outpatient cardiac catheterization which was performed on 10/11/2024 by Dr. Mady.  This revealed severe LM disease with LAD involvement.  He was noted to have heavy artery calcifications.  It was felt the patient should be admitted and transferred to [facility] for CABG evaluation vs. High risk PCI vs palliative medical therapy.  The patient is independent.  He is lives with his sister but does not require assistance with ADLs.  He denies chest pain, states he has never felt bad.  He states he has experienced some shortness of breath when trying to lay flat, but he hasn't had that several in months.  He quit smoking about 1 year ago.  He has smoked cigarettes in over 40 years.  He had been using Marijuana since 1980 but quit when he got sick.  He has previously been addicted to cocaine  but went to rehab and corrected the issue.  He no longer drinks alcohol, quit about  1 year ago. He denies family history of CAD.  He states he would like to avoid surgery if possible, however if it is the best option he would be willing to proceed.  Current Activity/ Functional Status: Patient is independent with mobility/ambulation, transfers, ADL's, IADL's.   Zubrod Score: At the time of surgery this patients most appropriate activity status/level should be described as: [x]     0    Normal activity, no symptoms []     1    Restricted in physical strenuous activity but ambulatory, able to do out light work []     2    Ambulatory and capable of self care, unable to do  work activities, up and about                 more than 50%  Of the time                            []     3    Only limited self care, in bed greater than 50% of waking hours []     4    Completely disabled, no self care, confined to bed or chair []     5    Moribund  Past Medical History:  Diagnosis Date   AKI (acute kidney injury) 08/30/2018   Anemia of chronic disease    Chronic systolic heart failure (HCC)    Diabetes mellitus without complication (HCC)    no meds since weight loss   Dialysis patient    Mon, Wed, Fri   ESRD (end stage renal disease) (HCC)    on HD (M,W,F)   Hyperkalemia 02/04/2021   Hypertension    Metabolic acidosis, increased anion gap 02/04/2021   Paroxysmal atrial fibrillation (HCC)    Renal disorder    Sepsis without acute organ dysfunction (HCC) 11/30/2023    Past Surgical History:  Procedure Laterality Date   CATARACT EXTRACTION W/PHACO Right 10/08/2023   Procedure: CATARACT EXTRACTION PHACO AND INTRAOCULAR LENS PLACEMENT (IOC) RIGHT DIABETIC 12.19 01:08.8;  Surgeon: Enola Feliciano Hugger, MD;  Location: [facility];  Service: Ophthalmology;  Laterality: Right;   CATARACT EXTRACTION W/PHACO Left 10/22/2023   Procedure: CATARACT EXTRACTION PHACO AND INTRAOCULAR LENS PLACEMENT (IOC) LEFT DIABETIC MALYUGIN;  Surgeon: Enola Feliciano Hugger, MD;  Location: [facility];  Service: Ophthalmology;  Laterality: Left;  7.66 0:54.2   COLONOSCOPY WITH PROPOFOL  N/A 11/21/2021   Procedure: COLONOSCOPY WITH PROPOFOL ;  Surgeon: Unk Corinn Skiff, MD;  Location: [facility] ENDOSCOPY;  Service: Gastroenterology;  Laterality: N/A;   DIALYSIS/PERMA CATHETER INSERTION Right 05/20/2024   Procedure: DIALYSIS/PERMA CATHETER INSERTION;  Surgeon: Marea Selinda RAMAN, MD;  Location: [facility] INVASIVE CV LAB;  Service: Cardiovascular;  Laterality: Right;   DIALYSIS/PERMA CATHETER REPAIR N/A 12/15/2023   Procedure: DIALYSIS/PERMA CATHETER REPAIR;  Surgeon: Jama Cordella MATSU, MD;   Location: [facility] INVASIVE CV LAB;  Service: Cardiovascular;  Laterality: N/A;   ESOPHAGOGASTRODUODENOSCOPY N/A 03/03/2024   Procedure: EGD (ESOPHAGOGASTRODUODENOSCOPY);  Surgeon: Unk Corinn Skiff, MD;  Location: [facility] ENDOSCOPY;  Service: Gastroenterology;  Laterality: N/A;   ESOPHAGOGASTRODUODENOSCOPY (EGD) WITH PROPOFOL  N/A 11/21/2021   Procedure: ESOPHAGOGASTRODUODENOSCOPY (EGD) WITH PROPOFOL ;  Surgeon: Unk Corinn Skiff, MD;  Location: [facility] ENDOSCOPY;  Service: Gastroenterology;  Laterality: N/A;   INSERTION OF DIALYSIS CATHETER     LEG SURGERY  1987   RIGHT/LEFT HEART CATH AND CORONARY ANGIOGRAPHY N/A 10/11/2024   Procedure: RIGHT/LEFT HEART CATH AND CORONARY ANGIOGRAPHY;  Surgeon: Mady Bruckner, MD;  Location: [facility] INVASIVE CV LAB;  Service: Cardiovascular;  Laterality: N/A;    Tobacco Use History[1]  Social History   Substance and Sexual Activity  Alcohol Use Yes   Alcohol/week: 7.0 standard drinks of alcohol   Types: 7 Cans of beer per week   Comment: no drinking 10/2023     Allergies[2]  Current Facility-Administered Medications  Medication Dose Route Frequency Provider Last Rate Last Admin   acetaminophen  (TYLENOL ) tablet 650 mg  650 mg Oral Q6H PRN Howerter, Justin B, DO       Or   acetaminophen  (TYLENOL ) suppository 650 mg  650 mg Rectal Q6H PRN Howerter, Justin B, DO       alteplase  (CATHFLO ACTIVASE ) injection 2 mg  2 mg Intracatheter Once PRN Druscilla Bald, NP       atorvastatin (LIPITOR) tablet 40 mg  40 mg Oral Daily Howerter, Justin B, DO       Chlorhexidine  Gluconate Cloth 2 % PADS 6 each  6 each Topical Daily Krishnan, Gokul, MD       Chlorhexidine  Gluconate Cloth 2 % PADS 6 each  6 each Topical Q0600 Dennise Hoes, MD       heparin  ADULT infusion 100 units/mL (25000 units/250mL)  1,850 Units/hr Intravenous Continuous Gretel Prentice BIRCH, RPH 18.5 mL/hr at 10/14/24 0239 1,850 Units/hr at 10/14/24 0239   heparin  injection 1,000 Units  1,000 Units Intracatheter  PRN Druscilla Bald, NP       hydrALAZINE  (APRESOLINE ) tablet 25 mg  25 mg Oral Q8H Howerter, Justin B, DO   25 mg at 10/14/24 0510   melatonin tablet 3 mg  3 mg Oral QHS PRN Howerter, Justin B, DO   3 mg at 10/13/24 2300   metoprolol  tartrate (LOPRESSOR ) tablet 25 mg  25 mg Oral BID Howerter, Justin B, DO   25 mg at 10/13/24 2300   ondansetron  (ZOFRAN ) injection 4 mg  4 mg Intravenous Q6H PRN Howerter, Justin B, DO       pantoprazole  (PROTONIX ) EC tablet 40 mg  40 mg Oral Daily Howerter, Justin B, DO       sacubitril-valsartan (ENTRESTO) 49-51 mg per tablet  1 tablet Oral BID Howerter, Justin B, DO        Medications Prior to Admission  Medication Sig Dispense Refill Last Dose/Taking   [Paused] apixaban  (ELIQUIS ) 5 MG TABS tablet Take 1 tablet (5 mg total) by mouth 2 (two) times daily. 180 tablet 1    atorvastatin (LIPITOR) 40 MG tablet Take 1 tablet (40 mg total) by mouth daily.      blood glucose meter kit and supplies KIT Dispense based on patient and insurance preference. Use up to four times daily as directed. (FOR ICD-9 250.00, 250.01). 1 each 0    calcium  acetate (PHOSLO ) 667 MG capsule Take 2 capsules (1,334 mg total) by mouth 3 (three) times a week.      epoetin  alfa-epbx (RETACRIT ) 10000 UNIT/ML injection Inject 1 mL (10,000 Units total) into the vein every Monday, Wednesday, and Friday at 6 PM.      feeding supplement (ENSURE PLUS HIGH PROTEIN) LIQD Take 237 mLs by mouth 2 (two) times daily between meals.      heparin  25000 UT/250ML infusion Inject 1,350 Units/hr into the vein continuous.      hydrALAZINE  (APRESOLINE ) 25 MG tablet Take 25 mg by mouth 3 (three) times daily.      hydrOXYzine  (ATARAX ) 25 MG tablet Take 25 mg by mouth 2 (two) times daily.  metoprolol  tartrate (LOPRESSOR ) 25 MG tablet Take 1 tablet (25 mg total) by mouth 2 (two) times daily.      Multiple Vitamin (MULTIVITAMIN ADULT PO) Take 1 tablet by mouth daily.      omeprazole  (PRILOSEC) 20 MG capsule Take 20  mg by mouth every morning.      sacubitril-valsartan (ENTRESTO) 49-51 MG Take 1 tablet by mouth 2 (two) times daily. 60 tablet 3    tamsulosin  (FLOMAX ) 0.4 MG CAPS capsule Take 0.4 mg by mouth daily.       Family History  Problem Relation Age of Onset   Diabetes Mellitus II Sister    Diabetes Mellitus II Paternal Grandmother      Review of Systems:       Cardiac Review of Systems: Y or  [    ]= no  Chest Pain [ N   ]  Resting SOB [ N  ] Exertional SOB  [ N ]  Orthopnea [ Y ]   Pedal Edema [   ]    Palpitations [Y  ] Syncope  [  ]   Presyncope [   ]  General Review of Systems: [Y] = yes [  ]=no Constitional: recent weight change [  ]; anorexia [  ]; fatigue [ Y ]; nausea [  ]; night sweats [  ]; fever [  ]; or chills [  ]                                                               Dental: Last Dentist visit:   Eye : blurred vision [  ]; diplopia [   ]; vision changes [  ];  Amaurosis fugax[  ]; Resp: cough [  ];  wheezing[  ];  hemoptysis[  ]; shortness of breath[  ]; paroxysmal nocturnal dyspnea[ Y ]; dyspnea on exertion[  ]; or orthopnea[  ];  GI:  gallstones[  ], vomiting[  ];  dysphagia[  ]; melena[  ];  hematochezia [  ]; heartburn[  ];   Hx of  Colonoscopy[  ]; GU: kidney stones [  ]; hematuria[  ];   dysuria [  ];  nocturia[  ];  history of     obstruction [  ]; urinary frequency [  ]             Skin: rash, swelling[N  ];, hair loss[  ];  peripheral edema[  ];  or itching[  ]; Musculosketetal: myalgias[  ];  joint swelling[  ];  joint erythema[  ];  joint pain[  ];  back pain[  ];  Heme/Lymph: bruising[  ];  bleeding[  ];  anemia[Y  ];  Neuro: TIA[  ];  headaches[  ];  stroke[  ];  vertigo[  ];  seizures[  ];   paresthesias[  ];  difficulty walking[ N ];  Psych:depression[  ]; anxiety[  ];  Endocrine: diabetes[ Y, diet controlled ];  thyroid dysfunction[  ];       Physical Exam: BP 120/76   Pulse 80   Temp 98.9 F (37.2 C) (Oral)   Wt 69.4 kg   SpO2 96%   BMI 20.74  kg/m   General appearance: alert, cooperative, and no distress Head: Normocephalic, without obvious abnormality, atraumatic Neck:  no adenopathy, no carotid bruit, no JVD, supple, symmetrical, trachea midline, and thyroid not enlarged, symmetric, no tenderness/mass/nodules Resp: diminished breath sounds right base Cardio: regular rate and rhythm GI: soft, non-tender; bowel sounds normal; no masses,  no organomegaly Extremities: extremities normal, atraumatic, no cyanosis or edema Neurologic: Grossly normal  Diagnostic Studies & Laboratory data:     Recent Radiology Findings:   No results found.   I have independently reviewed the above radiologic studies and discussed with the patient   Recent Lab Findings: Lab Results  Component Value Date   WBC 6.4 10/13/2024   HGB 8.8 (L) 10/13/2024   HCT 26.6 (L) 10/13/2024   PLT 161 10/13/2024   GLUCOSE 98 10/12/2024   CHOL 67 11/27/2023   TRIG 104 11/27/2023   HDL <10 (L) 11/27/2023   LDLCALC NOT CALCULATED 11/27/2023   ALT 7 10/11/2024   AST 22 10/11/2024   NA 137 10/12/2024   K 3.7 10/12/2024   CL 101 10/12/2024   CREATININE 5.30 (H) 10/12/2024   BUN 46 (H) 10/12/2024   CO2 23 10/12/2024   TSH 4.960 (H) 08/30/2018   INR 1.3 (H) 05/23/2024   HGBA1C 5.2 11/26/2023    Assessment / Plan:    Heavily Calcified CAD with LM disease- requesting CABG consultation Acute on Chronic HFrEF with Biventricular Failure- started on Entresto prior to transfer to [facility].. Dr. Mady felt AHF consultation would be beneficial Atrial Fib/Flutter- in NSR currently  Hepatitis C Cirrhosis HTN ESRD on dialysis-per nephrology Anemia of chronic disease  Patient is very pleasant.  He would most certainly be high risk to proceed with coronary bypass grafting, ? Atrial Appendage Clipping. He may very well not be considered a surgical candidate.  Dr. Shyrl is aware of patient and will evaluate and discuss with team to determine surgical candidacy   I   spent 55 minutes counseling the patient face to face.  Casey Shad, PA-C 10/14/2024 8:28 AM    Case was discussed today in cath conference.  Given all of his comorbidities and biventricular failure, the plan was to proceed with medical management.  Casey Franco MALVA Shyrl      [1]  Social History Tobacco Use  Smoking Status Former   Types: Cigarettes  Smokeless Tobacco Never  [2] No Known Allergies  "

## 2024-10-14 NOTE — Heart Team MDD (Signed)
" ° °  Heart Team Multi-Disciplinary Discussion  Patient: Casey Franco  DOB: 1961-09-06  MRN: [id number]   Date: 10/14/2024  9:29 AM    Attendees: Interventional Cardiology: Lonni Hanson, MD Gordy Bergamo, MD Newman Lawrence, MD Peter Jordan, MD      Patient History: 64 y.o. male with medical history significant for incisional disease on hemodialysis on Monday, with second Friday, chronic biventricular systolic heart failure paroxysmal atrial fibrillation chronically anticoagulated on Eliquis , moderate to severe mitral regurgitation, HTN, HLD, hepatitis C and liver cirrhosis.   Risk Factors: Diabetes Mellitus Hypertension Hyperlipidemia   CAD History: multivessel obstructive CAD   Review of Prior Angiography and PCI Procedures: Cardiac cath from 10/11/24 was reviewed in detail.   Discussion: The teams consensus was to optimize medical therapy for advanced heart failure.   Recommendations: Medical Therapy     Shona Palma, RN  10/14/2024 9:29 AM   "

## 2024-10-15 ENCOUNTER — Other Ambulatory Visit: Payer: Self-pay

## 2024-10-15 ENCOUNTER — Inpatient Hospital Stay (HOSPITAL_COMMUNITY)

## 2024-10-15 DIAGNOSIS — I5023 Acute on chronic systolic (congestive) heart failure: Secondary | ICD-10-CM

## 2024-10-15 DIAGNOSIS — I480 Paroxysmal atrial fibrillation: Secondary | ICD-10-CM | POA: Diagnosis not present

## 2024-10-15 DIAGNOSIS — Z992 Dependence on renal dialysis: Secondary | ICD-10-CM | POA: Diagnosis not present

## 2024-10-15 DIAGNOSIS — D62 Acute posthemorrhagic anemia: Secondary | ICD-10-CM | POA: Diagnosis not present

## 2024-10-15 DIAGNOSIS — I5021 Acute systolic (congestive) heart failure: Secondary | ICD-10-CM | POA: Diagnosis not present

## 2024-10-15 DIAGNOSIS — K920 Hematemesis: Secondary | ICD-10-CM | POA: Diagnosis not present

## 2024-10-15 DIAGNOSIS — I2510 Atherosclerotic heart disease of native coronary artery without angina pectoris: Secondary | ICD-10-CM | POA: Diagnosis not present

## 2024-10-15 DIAGNOSIS — N186 End stage renal disease: Secondary | ICD-10-CM | POA: Diagnosis not present

## 2024-10-15 DIAGNOSIS — R188 Other ascites: Secondary | ICD-10-CM | POA: Diagnosis not present

## 2024-10-15 LAB — CBC
HCT: 32.5 % — ABNORMAL LOW (ref 39.0–52.0)
Hemoglobin: 10.6 g/dL — ABNORMAL LOW (ref 13.0–17.0)
MCH: 30.1 pg (ref 26.0–34.0)
MCHC: 32.6 g/dL (ref 30.0–36.0)
MCV: 92.3 fL (ref 80.0–100.0)
Platelets: 201 K/uL (ref 150–400)
RBC: 3.52 MIL/uL — ABNORMAL LOW (ref 4.22–5.81)
RDW: 18 % — ABNORMAL HIGH (ref 11.5–15.5)
WBC: 7.7 K/uL (ref 4.0–10.5)
nRBC: 0 % (ref 0.0–0.2)

## 2024-10-15 LAB — LIPID PANEL
Cholesterol: 100 mg/dL (ref 0–200)
HDL: 56 mg/dL
LDL Cholesterol: 37 mg/dL (ref 0–99)
Total CHOL/HDL Ratio: 1.8 ratio
Triglycerides: 37 mg/dL
VLDL: 7 mg/dL (ref 0–40)

## 2024-10-15 LAB — RENAL FUNCTION PANEL
Albumin: 3.2 g/dL — ABNORMAL LOW (ref 3.5–5.0)
Anion gap: 17 — ABNORMAL HIGH (ref 5–15)
BUN: 29 mg/dL — ABNORMAL HIGH (ref 8–23)
CO2: 22 mmol/L (ref 22–32)
Calcium: 9.1 mg/dL (ref 8.9–10.3)
Chloride: 97 mmol/L — ABNORMAL LOW (ref 98–111)
Creatinine, Ser: 4.38 mg/dL — ABNORMAL HIGH (ref 0.61–1.24)
GFR, Estimated: 14 mL/min — ABNORMAL LOW
Glucose, Bld: 100 mg/dL — ABNORMAL HIGH (ref 70–99)
Phosphorus: 3.4 mg/dL (ref 2.5–4.6)
Potassium: 4 mmol/L (ref 3.5–5.1)
Sodium: 135 mmol/L (ref 135–145)

## 2024-10-15 LAB — HEPARIN LEVEL (UNFRACTIONATED)
Heparin Unfractionated: 0.41 [IU]/mL (ref 0.30–0.70)
Heparin Unfractionated: 0.35 [IU]/mL (ref 0.30–0.70)

## 2024-10-15 LAB — ECHOCARDIOGRAM LIMITED
Calc EF: 43.4 %
Height: 72 in
S' Lateral: 3.4 cm
Single Plane A2C EF: 48.4 %
Single Plane A4C EF: 36.8 %
Weight: 2500.9 [oz_av]

## 2024-10-15 LAB — HEPATITIS B SURFACE ANTIBODY, QUANTITATIVE: Hep B S AB Quant (Post): 5.5 m[IU]/mL — ABNORMAL LOW

## 2024-10-15 MED ORDER — EZETIMIBE 10 MG PO TABS
10.0000 mg | ORAL_TABLET | Freq: Every day | ORAL | Status: DC
  Administered 2024-10-16 – 2024-10-23 (×8): 10 mg via ORAL
  Filled 2024-10-15 (×8): qty 1

## 2024-10-15 MED ORDER — PNEUMOCOCCAL 20-VAL CONJ VACC 0.5 ML IM SUSY
0.5000 mL | PREFILLED_SYRINGE | INTRAMUSCULAR | Status: DC
  Filled 2024-10-15: qty 0.5

## 2024-10-15 NOTE — Progress Notes (Signed)
 "  Rounding Note   Patient Name: Casey Franco Date of Encounter: 10/15/2024  [facility] Cardiologist: Evalene Lunger, MD   Subjective - No acute events overnight - No complaints today - Deemed not a CABG candidate due to surgical risk  Scheduled Meds:  aspirin   81 mg Oral Daily   atorvastatin   40 mg Oral Daily   Chlorhexidine  Gluconate Cloth  6 each Topical Daily   Chlorhexidine  Gluconate Cloth  6 each Topical Q0600   Chlorhexidine  Gluconate Cloth  6 each Topical Q0600   hydrALAZINE   50 mg Oral Q8H   isosorbide  mononitrate  30 mg Oral Daily   pantoprazole   40 mg Oral Daily   pneumococcal 20-valent conjugate vaccine  0.5 mL Intramuscular Tomorrow-1000   sacubitril -valsartan   1 tablet Oral BID   Continuous Infusions:  heparin  2,150 Units/hr (10/15/24 0558)   PRN Meds: acetaminophen  **OR** acetaminophen , melatonin, ondansetron  (ZOFRAN ) IV   Vital Signs  Vitals:   10/15/24 0740 10/15/24 0835 10/15/24 1107 10/15/24 1223  BP:  109/86 115/72 114/69  Pulse:  80 78 77  Resp:  20 19 20   Temp:  98.9 F (37.2 C) 98.1 F (36.7 C) 98.2 F (36.8 C)  TempSrc:  Oral Oral Oral  SpO2:  97% 99% 98%  Weight:      Height: 6' (1.829 m)       Intake/Output Summary (Last 24 hours) at 10/15/2024 1243 Last data filed at 10/15/2024 0000 Gross per 24 hour  Intake 703.45 ml  Output 3000 ml  Net -2296.55 ml      10/15/2024    5:41 AM 10/14/2024    5:00 PM 10/14/2024    4:51 AM  Last 3 Weights  Weight (lbs) 156 lb 4.9 oz 155 lb 10.3 oz 152 lb 14.4 oz  Weight (kg) 70.9 kg 70.6 kg 69.355 kg      Telemetry NSR- Personally Reviewed  ECG  No new ECG  Physical Exam  GEN: No acute distress, very pleasant Neck: JVD to tragus Cardiac: RRR, II/VI systolic murmur heard throughout the precordium but loudest at the LLSB, no rubs, or gallops.  Respiratory: Bibasilar rales, no wheezes or rhonchi GI: Soft, nontender, abdomen is distended without tenderness to palpation MS: No edema; No  deformity. Neuro:  Nonfocal  Psych: Normal affect   Labs High Sensitivity Troponin:  No results for input(s): TROPONINIHS in the last 720 hours. No results for input(s): TRNPT in the last 720 hours.     Chemistry Recent Labs  Lab 10/11/24 0842 10/11/24 1536 10/12/24 0230 10/14/24 1044  NA 140  --  137 134*  K 3.6  --  3.7 3.6  CL  --   --  101 98  CO2  --   --  23 22  GLUCOSE  --   --  98 123*  BUN  --   --  46* 47*  CREATININE  --   --  5.30* 5.62*  CALCIUM   --   --  9.1 8.9  MG  --   --   --  1.9  PROT  --  8.1  --  7.7  ALBUMIN   --  3.4*  --  3.3*  AST  --  22  --  19  ALT  --  7  --  8  ALKPHOS  --  151*  --  141*  BILITOT  --  0.6  --  0.6  GFRNONAA  --   --  11* 11*  ANIONGAP  --   --  13 14    Lipids  Recent Labs  Lab 10/15/24 0954  CHOL 100  TRIG 37  HDL 56  LDLCALC 37  CHOLHDL 1.8    Hematology Recent Labs  Lab 10/14/24 1044 10/14/24 1933 10/15/24 0954  WBC 6.9 8.2 7.7  RBC 3.13* 3.33* 3.52*  HGB 9.3* 10.0* 10.6*  HCT 28.4* 30.2* 32.5*  MCV 90.7 90.7 92.3  MCH 29.7 30.0 30.1  MCHC 32.7 33.1 32.6  RDW 17.9* 17.8* 18.0*  PLT 172 178 201   Thyroid No results for input(s): TSH, FREET4 in the last 168 hours.  BNP Recent Labs  Lab 10/14/24 1044  PROBNP >35,000.0*    DDimer No results for input(s): DDIMER in the last 168 hours.   Radiology  US  Abdomen Limited Result Date: 10/15/2024 EXAM: LIMITED ABDOMINAL ULTRASOUND FOR ASCITES EVALUATION TECHNIQUE: Limited real-time sonography of all 4 quadrants of the abdomen was performed for evaluation of ascites. COMPARISON: US  Abdomen Limited 01/05/2024. CLINICAL HISTORY: Ascites. FINDINGS: RIGHT UPPER QUADRANT: Moderate to large ascites. LEFT UPPER QUADRANT: Moderate to large ascites. RIGHT LOWER QUADRANT: Moderate to large ascites. LEFT LOWER QUADRANT: Moderate to large ascites. OTHER: Limited visualization of the rest of the abdomen demonstrates no acute abnormality. IMPRESSION: 1. Moderate to  large  ascites throughout the abdomen and pelvis. Electronically signed by: Norman Gatlin MD 10/15/2024 07:59 AM EST RP Workstation: [id number]    Cardiac Studies   LHC/RHC 10/11/24:  Conclusions: Severe LMCA and LAD disease with heavy calcification of the coronary arteries, as detailed below. Severely elevated left heart, right heart, and pulmonary artery pressures.  Equalization of end-diastolic pressures noted, which can be seen with restrictive/constrictive physiology. Normal Fick cardiac output/index.  Patient Profile   64 y.o. male with a history of ESRD on HD, HCV with cirrhosis, hx of ascites/varices, PAF, CAD, and HFrEF secondary to ICM.   Assessment & Plan   #Severe Multivessel CAD Including LM Stenosis - Patient had a positive stress test that was obtained for SOB and was found to have severe multivessel CAD including severe LM disease. - The patient was deemed not a candidate for PCI and was recently seen by CTS and was also considered too high risk for surgery.  CTS recommended medical management. - Surprisingly the patient is chest pain-free and is overall asymptomatic. - Currently on aspirin  and a heparin  gtt. - Although he takes Eliquis  as an outpatient for PAF, will likely need aspirin  on top of this for the most aggressive medical treatment possible. Continue baby aspirin  81 mg daily Continue heparin  drip Continue atorvastatin  40 mg daily Although his LDL is currently 37, will start Zetia  to provide the most aggressive cholesterol treatment  #Acute on Chronic HFrEF Exacerbation - Patient with reduced LV systolic function and is currently severely volume overloaded. - Advanced heart failure saw the patient in recommended aggressive UF for volume removal. - Metoprolol  was discontinued to prevent low output state. - The patient is dialysis dependent and thus will need aggressive dialysis for fluid removal. Dialysis per nephrology but would like for daily UF for  severe volume overload Continue Entresto  Holding metoprolol  while decompensated Continue hydralazine  50 mg 3 times daily Continue Imdur  30 mg daily  #PAF - Currently NSR and asymptomatic. Continue heparin  gtt. for now Transition to Eliquis  prior to discharge  #HTN - BP stable.  No changes.       For questions or updates, please contact [facility] Please consult [email or web address] for contact info under  Signed, Georganna Archer, MD  10/15/2024, 12:43 PM    "

## 2024-10-15 NOTE — Progress Notes (Signed)
 PHARMACY - ANTICOAGULATION CONSULT NOTE  Pharmacy Consult for Heparin  Indication: AF  Allergies[1]  Patient Measurements: Height: 6' (182.9 cm) Weight: 70.9 kg (156 lb 4.9 oz) IBW/kg (Calculated) : 77.6  Vital Signs: Temp: 98.4 F (36.9 C) (01/03 1629) Temp Source: Oral (01/03 1629) BP: 113/75 (01/03 1629) Pulse Rate: 86 (01/03 1629)  Labs: Recent Labs    10/14/24 1044 10/14/24 1933 10/15/24 0954 10/15/24 1951  HGB 9.3* 10.0* 10.6*  --   HCT 28.4* 30.2* 32.5*  --   PLT 172 178 201  --   HEPARINUNFRC  --  0.22* 0.41 0.35  CREATININE 5.62*  --  4.38*  --     Estimated Creatinine Clearance: 17.1 mL/min (A) (by C-G formula based on SCr of 4.38 mg/dL (H)).   Medical History: Past Medical History:  Diagnosis Date   AKI (acute kidney injury) 08/30/2018   Anemia of chronic disease    Chronic systolic heart failure (HCC)    Diabetes mellitus without complication (HCC)    no meds since weight loss   Dialysis patient    Mon, Wed, Fri   ESRD (end stage renal disease) (HCC)    on HD (M,W,F)   Hyperkalemia 02/04/2021   Hypertension    Metabolic acidosis, increased anion gap 02/04/2021   Paroxysmal atrial fibrillation (HCC)    Renal disorder    Sepsis without acute organ dysfunction (HCC) 11/30/2023    Medications:  Medications Prior to Admission  Medication Sig Dispense Refill Last Dose/Taking   [Paused] apixaban  (ELIQUIS ) 5 MG TABS tablet Take 1 tablet (5 mg total) by mouth 2 (two) times daily. 180 tablet 1 10/07/2024   calcium  acetate (PHOSLO ) 667 MG capsule Take 2 capsules (1,334 mg total) by mouth 3 (three) times a week.   10/10/2024   hydrALAZINE  (APRESOLINE ) 25 MG tablet Take 25 mg by mouth 3 (three) times daily.   10/11/2024   hydrOXYzine  (ATARAX ) 25 MG tablet Take 25 mg by mouth 2 (two) times daily.   10/10/2024   Multiple Vitamin (MULTIVITAMIN ADULT PO) Take 1 tablet by mouth daily.   10/10/2024   omeprazole  (PRILOSEC) 20 MG capsule Take 20 mg by mouth every  morning.   10/10/2024   tamsulosin  (FLOMAX ) 0.4 MG CAPS capsule Take 0.4 mg by mouth daily.   10/10/2024   albumin  human 25 % bottle Inject into the vein. (Patient not taking: Reported on 10/14/2024)   Not Taking   amLODipine  (NORVASC ) 10 MG tablet Take 10 mg by mouth daily. (Patient not taking: Reported on 10/14/2024)   Not Taking   atorvastatin  (LIPITOR) 40 MG tablet Take 1 tablet (40 mg total) by mouth daily.      epoetin  alfa-epbx (RETACRIT ) 10000 UNIT/ML injection Inject 1 mL (10,000 Units total) into the vein every Monday, Wednesday, and Friday at 6 PM. (Patient not taking: Reported on 10/14/2024)   Not Taking   feeding supplement (ENSURE PLUS HIGH PROTEIN) LIQD Take 237 mLs by mouth 2 (two) times daily between meals.      heparin  25000 UT/250ML infusion Inject 1,350 Units/hr into the vein continuous.      irbesartan  (AVAPRO ) 300 MG tablet Take 300 mg by mouth daily. (Patient not taking: Reported on 10/14/2024)   Not Taking   melatonin 5 MG TABS Take 5 mg by mouth at bedtime as needed (sleep). (Patient not taking: Reported on 10/14/2024)   Not Taking   metoprolol  tartrate (LOPRESSOR ) 25 MG tablet Take 1 tablet (25 mg total) by mouth 2 (two) times daily. (  Patient not taking: Reported on 10/14/2024)   Not Taking   sacubitril -valsartan  (ENTRESTO ) 49-51 MG Take 1 tablet by mouth 2 (two) times daily. 60 tablet 3     Assessment: 64 y/o M with a h/o AF on Eliquis  recently increased to 5 mg bid due to stroke risk (CHA2DS2/VAS of 3) but previously 2.5 mg bid subtherapeutic dose for cirrhosis/ESRD increased bleeding risk. LHC with LM disease. Pharmacy consulted for heparin  bridging .    1/3 Update: heparin  level drawn at ~10:00 was therapeutic at 0.41 on infusion of 2150 units/h. Hgb stable and platelets WNL. No issues with heparin  infusion or bleeding reported.   PM update: heparin  level 0.35 is at low end of therapeutic range on 2150 units/hr. No issues with the infusion or bleeding reported.    Goal of  Therapy:  Heparin  level 0.3-0.7 units/ml aPTT 66-102 seconds Monitor platelets by anticoagulation protocol: Yes   Plan:  Increase heparin  drip slightly to 2200 units/hr Daily CBC, heparin  level  Rocky Slade, PharmD, BCPS 10/15/2024 8:27 PM  Please check AMION for all [facility] Pharmacy phone numbers After 10:00 PM, call Main Pharmacy [phone]         [1] No Known Allergies

## 2024-10-15 NOTE — Plan of Care (Signed)

## 2024-10-15 NOTE — Progress Notes (Signed)
 "  [facility] PROGRESS NOTE   Casey Franco FMW:[id number] DOB: 1960/12/14 DOA: 10/13/2024  PCP: [facility]  Brief History: 64 y.o. male with a PMH significant for chronic HFrEF with LVEF 50% on most recent stress test, ESRD on HD MWF, HTN, HLD, PAF on Eliquis , hepatitis C and liver cirrhosis, presented with worsening of exertional dyspnea. Underwent stress test recently which showed a moderate size severe partially reversible defect involving the anterior wall apex and apical inferior segment consistent with scar and peri-infarct ischemia. LVEF 51%. Presented to [facility] for elective LHC and RHC. Cath found L main stenosis.  Patient was subsequently transferred to [facility] for cardiothoracic surgery input.  Consultants: Cardiology.  Cardiothoracic surgery.  Nephrology  Procedures: Cardiac catheterization    Subjective/Interval History: Patient feels well this morning.  Lying comfortably on the bed.  Denies any chest pain or shortness of breath.    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  Patient was transferred to [facility] for cardiothoracic input. Patient is noted to be on IV heparin .   Patient seen by cardiothoracic surgery.  Case was also discussed by cardiology team.  He is not a candidate for any surgeries or interventions.  Medical management to be pursued. Patient is on aspirin , hydralazine  and isosorbide  mononitrate.  His metoprolol  was held due to acute CHF.  Acute on chronic biventricular CHF Echo done in October 2025 showed LVEF of 35 to 40% with severely reduced right ventricular systolic function as well.   Patient was started on Entresto  by cardiology.   Volume is being managed primarily with hemodialysis.  Underwent dialysis yesterday. Heart failure team is following.  Limited echocardiogram ordered for this  morning.  End-stage renal disease on hemodialysis Usually dialyzed Monday Wednesday Friday. Nephrology is following.  Dialyzed yesterday.    Paroxysmal atrial fibrillation Stable.  Was on Eliquis  prior to admission.  Currently on IV heparin .  On metoprolol .  Anemia of chronic disease Hemoglobin noted to be low but stable.  No evidence of overt bleeding.  History of liver cirrhosis/hepatitis C Compensated.  DVT Prophylaxis: On IV heparin  Code Status: Full code Family Communication: Discussed with patient Disposition Plan: To be determined   Medications: Scheduled:  aspirin   81 mg Oral Daily   atorvastatin   40 mg Oral Daily   Chlorhexidine  Gluconate Cloth  6 each Topical Daily   Chlorhexidine  Gluconate Cloth  6 each Topical Q0600   Chlorhexidine  Gluconate Cloth  6 each Topical Q0600   hydrALAZINE   50 mg Oral Q8H   isosorbide  mononitrate  30 mg Oral Daily   pantoprazole   40 mg Oral Daily   pneumococcal 20-valent conjugate vaccine  0.5 mL Intramuscular Tomorrow-1000   sacubitril -valsartan   1 tablet Oral BID   Continuous:  heparin  2,150 Units/hr (10/15/24 0558)   PRN:acetaminophen  **OR** acetaminophen , melatonin, ondansetron  (ZOFRAN ) IV    Objective:  Vital Signs  Vitals:   10/15/24 0100 10/15/24 0541 10/15/24 0740 10/15/24 0835  BP: (!) 95/59   (P) 109/86  Pulse: 80 76  (P) 80  Resp:    (P) 20  Temp: 98.4 F (36.9 C) 98.1 F (36.7 C)  (P) 98.9 F (37.2 C)  TempSrc: Oral Oral  (P) Oral  SpO2: 98% 96%  (P) 97%  Weight:  70.9 kg    Height:   6' (1.829 m)     Intake/Output Summary (Last 24 hours) at 10/15/2024 1010  Last data filed at 10/15/2024 0000 Gross per 24 hour  Intake 939.45 ml  Output 3000 ml  Net -2060.55 ml   Filed Weights   10/14/24 0451 10/14/24 1700 10/15/24 0541  Weight: 69.4 kg 70.6 kg 70.9 kg    General appearance: Awake alert.  In no distress Resp: Clear to auscultation bilaterally.  Normal effort Cardio: S1-S2 is normal regular.  No  S3-S4.  No rubs murmurs or bruit GI: Abdomen is soft.  Nontender nondistended.  Bowel sounds are present normal.  No masses organomegaly Extremities: No edema.  Full range of motion of lower extremities. Neurologic: Alert and oriented x3.  No focal neurological deficits.    Lab Results:  Data Reviewed: I have personally reviewed following labs and reports of the imaging studies  CBC: Recent Labs  Lab 10/11/24 0842 10/12/24 0230 10/13/24 0348 10/14/24 1044 10/14/24 1933  WBC  --  4.9 6.4 6.9 8.2  NEUTROABS  --   --   --  5.0  --   HGB 8.2* 7.3* 8.8* 9.3* 10.0*  HCT 24.0* 22.9* 26.6* 28.4* 30.2*  MCV  --  90.5 89.6 90.7 90.7  PLT  --  154 161 172 178    Basic Metabolic Panel: Recent Labs  Lab 10/11/24 0841 10/11/24 0842 10/12/24 0230 10/12/24 1307 10/14/24 1044  NA 140 140 137  --  134*  K 3.6 3.6 3.7  --  3.6  CL  --   --  101  --  98  CO2  --   --  23  --  22  GLUCOSE  --   --  98  --  123*  BUN  --   --  46*  --  47*  CREATININE  --   --  5.30*  --  5.62*  CALCIUM   --   --  9.1  --  8.9  MG  --   --   --   --  1.9  PHOS  --   --   --  2.1* 4.1    GFR: Estimated Creatinine Clearance: 13.3 mL/min (A) (by C-G formula based on SCr of 5.62 mg/dL (H)).  Liver Function Tests: Recent Labs  Lab 10/11/24 1536 10/14/24 1044  AST 22 19  ALT 7 8  ALKPHOS 151* 141*  BILITOT 0.6 0.6  PROT 8.1 7.7  ALBUMIN  3.4* 3.3*    Radiology Studies: US  Abdomen Limited Result Date: 10/15/2024 EXAM: LIMITED ABDOMINAL ULTRASOUND FOR ASCITES EVALUATION TECHNIQUE: Limited real-time sonography of all 4 quadrants of the abdomen was performed for evaluation of ascites. COMPARISON: US  Abdomen Limited 01/05/2024. CLINICAL HISTORY: Ascites. FINDINGS: RIGHT UPPER QUADRANT: Moderate to large ascites. LEFT UPPER QUADRANT: Moderate to large ascites. RIGHT LOWER QUADRANT: Moderate to large ascites. LEFT LOWER QUADRANT: Moderate to large ascites. OTHER: Limited visualization of the rest of the  abdomen demonstrates no acute abnormality. IMPRESSION: 1. Moderate to large  ascites throughout the abdomen and pelvis. Electronically signed by: Norman Gatlin MD 10/15/2024 07:59 AM EST RP Workstation: [id number]       LOS: 2 days   Joette Pebbles  [facility] Pager on [email or web address]  10/15/2024, 10:10 AM   "

## 2024-10-15 NOTE — Progress Notes (Addendum)
 PHARMACY - ANTICOAGULATION CONSULT NOTE  Pharmacy Consult for Heparin  Indication: AF  Allergies[1]  Patient Measurements: Height: 6' (182.9 cm) Weight: 70.9 kg (156 lb 4.9 oz) IBW/kg (Calculated) : 77.6  Vital Signs: Temp: 98.1 F (36.7 C) (01/03 1107) Temp Source: Oral (01/03 1107) BP: 115/72 (01/03 1107) Pulse Rate: 78 (01/03 1107)  Labs: Recent Labs    10/14/24 0829 10/14/24 1044 10/14/24 1933 10/15/24 0954  HGB  --  9.3* 10.0* 10.6*  HCT  --  28.4* 30.2* 32.5*  PLT  --  172 178 201  HEPARINUNFRC 0.18*  --  0.22* 0.41  CREATININE  --  5.62*  --   --     Estimated Creatinine Clearance: 13.3 mL/min (A) (by C-G formula based on SCr of 5.62 mg/dL (H)).   Medical History: Past Medical History:  Diagnosis Date   AKI (acute kidney injury) 08/30/2018   Anemia of chronic disease    Chronic systolic heart failure (HCC)    Diabetes mellitus without complication (HCC)    no meds since weight loss   Dialysis patient    Mon, Wed, Fri   ESRD (end stage renal disease) (HCC)    on HD (M,W,F)   Hyperkalemia 02/04/2021   Hypertension    Metabolic acidosis, increased anion gap 02/04/2021   Paroxysmal atrial fibrillation (HCC)    Renal disorder    Sepsis without acute organ dysfunction (HCC) 11/30/2023    Medications:  Medications Prior to Admission  Medication Sig Dispense Refill Last Dose/Taking   [Paused] apixaban  (ELIQUIS ) 5 MG TABS tablet Take 1 tablet (5 mg total) by mouth 2 (two) times daily. 180 tablet 1 10/07/2024   calcium  acetate (PHOSLO ) 667 MG capsule Take 2 capsules (1,334 mg total) by mouth 3 (three) times a week.   10/10/2024   hydrALAZINE  (APRESOLINE ) 25 MG tablet Take 25 mg by mouth 3 (three) times daily.   10/11/2024   hydrOXYzine  (ATARAX ) 25 MG tablet Take 25 mg by mouth 2 (two) times daily.   10/10/2024   Multiple Vitamin (MULTIVITAMIN ADULT PO) Take 1 tablet by mouth daily.   10/10/2024   omeprazole  (PRILOSEC) 20 MG capsule Take 20 mg by mouth every  morning.   10/10/2024   tamsulosin  (FLOMAX ) 0.4 MG CAPS capsule Take 0.4 mg by mouth daily.   10/10/2024   albumin  human 25 % bottle Inject into the vein. (Patient not taking: Reported on 10/14/2024)   Not Taking   amLODipine  (NORVASC ) 10 MG tablet Take 10 mg by mouth daily. (Patient not taking: Reported on 10/14/2024)   Not Taking   atorvastatin  (LIPITOR) 40 MG tablet Take 1 tablet (40 mg total) by mouth daily.      epoetin  alfa-epbx (RETACRIT ) 10000 UNIT/ML injection Inject 1 mL (10,000 Units total) into the vein every Monday, Wednesday, and Friday at 6 PM. (Patient not taking: Reported on 10/14/2024)   Not Taking   feeding supplement (ENSURE PLUS HIGH PROTEIN) LIQD Take 237 mLs by mouth 2 (two) times daily between meals.      heparin  25000 UT/250ML infusion Inject 1,350 Units/hr into the vein continuous.      irbesartan  (AVAPRO ) 300 MG tablet Take 300 mg by mouth daily. (Patient not taking: Reported on 10/14/2024)   Not Taking   melatonin 5 MG TABS Take 5 mg by mouth at bedtime as needed (sleep). (Patient not taking: Reported on 10/14/2024)   Not Taking   metoprolol  tartrate (LOPRESSOR ) 25 MG tablet Take 1 tablet (25 mg total) by mouth 2 (two)  times daily. (Patient not taking: Reported on 10/14/2024)   Not Taking   sacubitril -valsartan  (ENTRESTO ) 49-51 MG Take 1 tablet by mouth 2 (two) times daily. 60 tablet 3     Assessment: 64 y/o M with a h/o AF on Eliquis  recently increased to 5 mg bid due to stroke risk (CHA2DS2/VAS of 3) but previously 2.5 mg bid subtherapeutic dose for cirrhosis/ESRD increased bleeding risk. LHC with LM disease. Pharmacy consulted for heparin  bridging .    1/3 Update: heparin  level drawn at ~10:00 was therapeutic at 0.41 on infusion of 2150 units/h. Hgb stable and platelets WNL. No issues with heparin  infusion or bleeding reported.     Goal of Therapy:  Heparin  level 0.3-0.7 units/ml aPTT 66-102 seconds Monitor platelets by anticoagulation protocol: Yes   Plan:  Continue  heparin  at 2150 units/hr Confirmatory heparin  level in 8 hrs Daily CBC, heparin  level  Maurilio Patten, PharmD PGY1 Pharmacy Resident [facility] 10/15/2024 11:18 AM       [1] No Known Allergies

## 2024-10-15 NOTE — Progress Notes (Signed)
 " [facility] Progress Note   Subjective:   Seen in room, s/p HD yesterday with 3L off. Plan was to attempt serial HD for further fluid removal, but we have a very high dialysis census and he is asymptomatic - will try to run him tonight. He denies CP/dyspnea at this time.   Objective Vitals:   10/15/24 0541 10/15/24 0740 10/15/24 0835 10/15/24 1107  BP:   (P) 109/86 115/72  Pulse: 76  (P) 80 78  Resp:   (P) 20 19  Temp: 98.1 F (36.7 C)  (P) 98.9 F (37.2 C) 98.1 F (36.7 C)  TempSrc: Oral  (P) Oral Oral  SpO2: 96%  (P) 97% 99%  Weight: 70.9 kg     Height:  6' (1.829 m)     Physical Exam General: Well appearing, NAD. Room air Heart: RRR Lungs: CTA upper lobes, bibasilar rales Abdomen: soft, slightly distended Extremities: no LE edema Dialysis Access: [facility]  Additional Objective Labs: Basic Metabolic Panel: Recent Labs  Lab 10/11/24 0842 10/12/24 0230 10/12/24 1307 10/14/24 1044  NA 140 137  --  134*  K 3.6 3.7  --  3.6  CL  --  101  --  98  CO2  --  23  --  22  GLUCOSE  --  98  --  123*  BUN  --  46*  --  47*  CREATININE  --  5.30*  --  5.62*  CALCIUM   --  9.1  --  8.9  PHOS  --   --  2.1* 4.1   Liver Function Tests: Recent Labs  Lab 10/11/24 1536 10/14/24 1044  AST 22 19  ALT 7 8  ALKPHOS 151* 141*  BILITOT 0.6 0.6  PROT 8.1 7.7  ALBUMIN  3.4* 3.3*   CBC: Recent Labs  Lab 10/12/24 0230 10/13/24 0348 10/14/24 1044 10/14/24 1933 10/15/24 0954  WBC 4.9 6.4 6.9 8.2 7.7  NEUTROABS  --   --  5.0  --   --   HGB 7.3* 8.8* 9.3* 10.0* 10.6*  HCT 22.9* 26.6* 28.4* 30.2* 32.5*  MCV 90.5 89.6 90.7 90.7 92.3  PLT 154 161 172 178 201   Studies/Results: US  Abdomen Limited Result Date: 10/15/2024 EXAM: LIMITED ABDOMINAL ULTRASOUND FOR ASCITES EVALUATION TECHNIQUE: Limited real-time sonography of all 4 quadrants of the abdomen was performed for evaluation of ascites. COMPARISON: US  Abdomen Limited 01/05/2024. CLINICAL HISTORY: Ascites. FINDINGS:  RIGHT UPPER QUADRANT: Moderate to large ascites. LEFT UPPER QUADRANT: Moderate to large ascites. RIGHT LOWER QUADRANT: Moderate to large ascites. LEFT LOWER QUADRANT: Moderate to large ascites. OTHER: Limited visualization of the rest of the abdomen demonstrates no acute abnormality. IMPRESSION: 1. Moderate to large  ascites throughout the abdomen and pelvis. Electronically signed by: Norman Gatlin MD 10/15/2024 07:59 AM EST RP Workstation: [id number]   Medications:  heparin  2,150 Units/hr (10/15/24 0558)    aspirin   81 mg Oral Daily   atorvastatin   40 mg Oral Daily   Chlorhexidine  Gluconate Cloth  6 each Topical Daily   Chlorhexidine  Gluconate Cloth  6 each Topical Q0600   Chlorhexidine  Gluconate Cloth  6 each Topical Q0600   hydrALAZINE   50 mg Oral Q8H   isosorbide  mononitrate  30 mg Oral Daily   pantoprazole   40 mg Oral Daily   pneumococcal 20-valent conjugate vaccine  0.5 mL Intramuscular Tomorrow-1000   sacubitril -valsartan   1 tablet Oral BID   Dialysis Orders [facility] - MWF 3:15hr, 400/800, EDW 65 kg, TDC, 3K/2.5 calcium .  Heparin : 800 units bolus, 400 units hourly.  Meds: Mircera 150 mcg every 2 weeks (last dose as an outpatient 12/29), Venofer 50 mg q. weekly  Assessment/Plan: CAD, severe multivessel disease: Not a candidate for CABG Biventricular HF: With volume on imaging/exam. Trying serial HD but with very long HD census in-house. On Entresto , hydral/iso. ESRD: Usual MWF schedule - s/p HD 10/14/24, trying to schedule another today if possible. HTN/volume: BP good, may have to scale back on something to allow UF - see how goes with next HD. Anemia of ESRD: Hgb 10.6, continue ESA as outpatient Secondary HPTH: Ca/Phos ok - continue home meds Nutrition: Alb ok. Pt desiring heart-healthy diet rather than renal diet, ok for now - watch labs.   Izetta Boehringer, PA-C 10/15/2024, 12:04 PM  [facility]    "

## 2024-10-15 NOTE — Progress Notes (Signed)
" °  Echocardiogram 2D Echocardiogram has been performed.  Tinnie FORBES Gosling RDCS 10/15/2024, 10:25 AM "

## 2024-10-16 DIAGNOSIS — I2510 Atherosclerotic heart disease of native coronary artery without angina pectoris: Secondary | ICD-10-CM | POA: Diagnosis not present

## 2024-10-16 DIAGNOSIS — I480 Paroxysmal atrial fibrillation: Secondary | ICD-10-CM | POA: Diagnosis not present

## 2024-10-16 DIAGNOSIS — N186 End stage renal disease: Secondary | ICD-10-CM | POA: Diagnosis not present

## 2024-10-16 DIAGNOSIS — I5023 Acute on chronic systolic (congestive) heart failure: Secondary | ICD-10-CM | POA: Diagnosis not present

## 2024-10-16 LAB — RENAL FUNCTION PANEL
Albumin: 2.9 g/dL — ABNORMAL LOW (ref 3.5–5.0)
Anion gap: 11 (ref 5–15)
BUN: 38 mg/dL — ABNORMAL HIGH (ref 8–23)
CO2: 25 mmol/L (ref 22–32)
Calcium: 8.7 mg/dL — ABNORMAL LOW (ref 8.9–10.3)
Chloride: 98 mmol/L (ref 98–111)
Creatinine, Ser: 5.37 mg/dL — ABNORMAL HIGH (ref 0.61–1.24)
GFR, Estimated: 11 mL/min — ABNORMAL LOW
Glucose, Bld: 100 mg/dL — ABNORMAL HIGH (ref 70–99)
Phosphorus: 3.9 mg/dL (ref 2.5–4.6)
Potassium: 3.8 mmol/L (ref 3.5–5.1)
Sodium: 134 mmol/L — ABNORMAL LOW (ref 135–145)

## 2024-10-16 LAB — CBC
HCT: 27.1 % — ABNORMAL LOW (ref 39.0–52.0)
Hemoglobin: 8.8 g/dL — ABNORMAL LOW (ref 13.0–17.0)
MCH: 29.8 pg (ref 26.0–34.0)
MCHC: 32.5 g/dL (ref 30.0–36.0)
MCV: 91.9 fL (ref 80.0–100.0)
Platelets: 168 K/uL (ref 150–400)
RBC: 2.95 MIL/uL — ABNORMAL LOW (ref 4.22–5.81)
RDW: 17.7 % — ABNORMAL HIGH (ref 11.5–15.5)
WBC: 8.1 K/uL (ref 4.0–10.5)
nRBC: 0 % (ref 0.0–0.2)

## 2024-10-16 LAB — HEPARIN LEVEL (UNFRACTIONATED): Heparin Unfractionated: 0.45 [IU]/mL (ref 0.30–0.70)

## 2024-10-16 MED ORDER — HEPARIN SODIUM (PORCINE) 1000 UNIT/ML IJ SOLN
INTRAMUSCULAR | Status: AC
  Filled 2024-10-16: qty 4

## 2024-10-16 MED ORDER — PROSOURCE PLUS PO LIQD
30.0000 mL | Freq: Two times a day (BID) | ORAL | Status: DC
  Administered 2024-10-16 – 2024-10-17 (×3): 30 mL via ORAL
  Filled 2024-10-16 (×10): qty 30

## 2024-10-16 MED ORDER — HEPARIN SODIUM (PORCINE) 1000 UNIT/ML IJ SOLN
1000.0000 [IU] | Freq: Once | INTRAMUSCULAR | Status: AC
  Administered 2024-10-16: 3200 [IU] via INTRAVENOUS

## 2024-10-16 NOTE — Progress Notes (Signed)
 Patient in HD when US  called for him to come for para. Paracentesis to be delayed to 10/17/24 for this reason.   Rashena Dowling NP 10/16/2024 1:04 PM

## 2024-10-16 NOTE — Progress Notes (Signed)
 " [facility] Progress Note   Subjective:   Seen in room - feels ok, no CP/dyspnea. Unable to dialyze yesterday, but able to get him in for short-HD today - he is agreeable. Also reports may be having paracentesis today.  Objective Vitals:   10/15/24 1629 10/15/24 2146 10/16/24 0005 10/16/24 0453  BP: 113/75 (!) 142/84 110/60 125/72  Pulse: 86 90 89 92  Resp: 17 19 20 20   Temp: 98.4 F (36.9 C) 98.4 F (36.9 C) 98.9 F (37.2 C) 98.6 F (37 C)  TempSrc: Oral Oral Oral Oral  SpO2: 95% 99% 98%   Weight:    68.4 kg  Height:       Physical Exam General: Well appearing, NAD. Room air Heart: RRR Lungs: CTA upper lobes, bibasilar rales Abdomen: soft, slightly distended Extremities: no LE edema Dialysis Access: [facility]  Additional Objective Labs: Basic Metabolic Panel: Recent Labs  Lab 10/14/24 1044 10/15/24 0954 10/16/24 0446  NA 134* 135 134*  K 3.6 4.0 3.8  CL 98 97* 98  CO2 22 22 25   GLUCOSE 123* 100* 100*  BUN 47* 29* 38*  CREATININE 5.62* 4.38* 5.37*  CALCIUM  8.9 9.1 8.7*  PHOS 4.1 3.4 3.9   Liver Function Tests: Recent Labs  Lab 10/11/24 1536 10/14/24 1044 10/15/24 0954 10/16/24 0446  AST 22 19  --   --   ALT 7 8  --   --   ALKPHOS 151* 141*  --   --   BILITOT 0.6 0.6  --   --   PROT 8.1 7.7  --   --   ALBUMIN  3.4* 3.3* 3.2* 2.9*   CBC: Recent Labs  Lab 10/13/24 0348 10/14/24 1044 10/14/24 1933 10/15/24 0954 10/16/24 0446  WBC 6.4 6.9 8.2 7.7 8.1  NEUTROABS  --  5.0  --   --   --   HGB 8.8* 9.3* 10.0* 10.6* 8.8*  HCT 26.6* 28.4* 30.2* 32.5* 27.1*  MCV 89.6 90.7 90.7 92.3 91.9  PLT 161 172 178 201 168   Studies/Results: ECHOCARDIOGRAM LIMITED Result Date: 10/15/2024    ECHOCARDIOGRAM LIMITED REPORT   Patient Name:   Casey Franco Corvino Date of Exam: 10/15/2024 Medical Rec #:  [id number]       Height:       72.0 in Accession #:    [id number]      Weight:       156.3 lb Date of Birth:  June 14, 1961        BSA:          1.919 m Patient Age:     64 years        BP:           95/59 mmHg Patient Gender: M               HR:           80 bpm. Exam Location:  Inpatient Procedure: Limited Echo, Color Doppler and Cardiac Doppler (Both Spectral and            Color Flow Doppler were utilized during procedure). Indications:    CHF _- Acute Systolic I50.21  History:        Patient has prior history of Echocardiogram examinations, most                 recent 08/11/2024. Risk Factors:Hypertension.  Sonographer:    Tinnie Gosling RDCS Referring Phys: [phone] DALTON S MCLEAN IMPRESSIONS  1. Left ventricular ejection fraction, by estimation,  is 40 to 45%. The left ventricle has mildly decreased function. The left ventricle demonstrates global hypokinesis. There is the interventricular septum is flattened in diastole ('D' shaped left ventricle), consistent with right ventricular volume overload.  2. Right ventricular systolic function is moderately reduced. The right ventricular size is severely enlarged. Tricuspid regurgitation signal is inadequate for assessing PA pressure.  3. The mitral valve is degenerative. Trivial mitral valve regurgitation. No evidence of mitral stenosis. Moderate mitral annular calcification.  4. The aortic valve is calcified. There is severe calcifcation of the aortic valve. There is severe thickening of the aortic valve. Aortic valve regurgitation is not visualized. Aortic valve sclerosis/calcification is present, without any evidence of aortic stenosis.  5. The inferior vena cava is dilated in size with <50% respiratory variability, suggesting right atrial pressure of 15 mmHg. FINDINGS  Left Ventricle: Left ventricular ejection fraction, by estimation, is 40 to 45%. The left ventricle has mildly decreased function. The left ventricle demonstrates global hypokinesis. The left ventricular internal cavity size was normal in size. There is  no left ventricular hypertrophy. The interventricular septum is flattened in diastole ('D' shaped left  ventricle), consistent with right ventricular volume overload. Right Ventricle: The right ventricular size is severely enlarged. No increase in right ventricular wall thickness. Right ventricular systolic function is moderately reduced. Tricuspid regurgitation signal is inadequate for assessing PA pressure. Left Atrium: Left atrial size was not assessed. Right Atrium: Right atrial size was not assessed. Pericardium: There is no evidence of pericardial effusion. Mitral Valve: The mitral valve is degenerative in appearance. There is mild thickening of the mitral valve leaflet(s). Moderate mitral annular calcification. Trivial mitral valve regurgitation. No evidence of mitral valve stenosis. Tricuspid Valve: The tricuspid valve is normal in structure. Tricuspid valve regurgitation is mild . No evidence of tricuspid stenosis. Aortic Valve: The aortic valve is calcified. There is severe calcifcation of the aortic valve. There is severe thickening of the aortic valve. Aortic valve regurgitation is not visualized. Aortic valve sclerosis/calcification is present, without any evidence of aortic stenosis. Pulmonic Valve: The pulmonic valve was normal in structure. Pulmonic valve regurgitation is not visualized. No evidence of pulmonic stenosis. Aorta: The aortic root is normal in size and structure. Venous: The inferior vena cava is dilated in size with less than 50% respiratory variability, suggesting right atrial pressure of 15 mmHg. IAS/Shunts: No atrial level shunt detected by color flow Doppler. LEFT VENTRICLE PLAX 2D LVIDd:         4.50 cm LVIDs:         3.40 cm LV PW:         1.10 cm LV IVS:        1.00 cm  LV Volumes (MOD) LV vol d, MOD A2C: 138.0 ml LV vol d, MOD A4C: 122.0 ml LV vol s, MOD A2C: 71.2 ml LV vol s, MOD A4C: 77.1 ml LV SV MOD A2C:     66.8 ml LV SV MOD A4C:     122.0 ml LV SV MOD BP:      57.7 ml IVC IVC diam: 2.15 cm Wilbert Bihari MD Electronically signed by Wilbert Bihari MD Signature Date/Time:  10/15/2024/3:49:35 PM    Final    US  Abdomen Limited Result Date: 10/15/2024 EXAM: LIMITED ABDOMINAL ULTRASOUND FOR ASCITES EVALUATION TECHNIQUE: Limited real-time sonography of all 4 quadrants of the abdomen was performed for evaluation of ascites. COMPARISON: US  Abdomen Limited 01/05/2024. CLINICAL HISTORY: Ascites. FINDINGS: RIGHT UPPER QUADRANT: Moderate to large ascites. LEFT UPPER  QUADRANT: Moderate to large ascites. RIGHT LOWER QUADRANT: Moderate to large ascites. LEFT LOWER QUADRANT: Moderate to large ascites. OTHER: Limited visualization of the rest of the abdomen demonstrates no acute abnormality. IMPRESSION: 1. Moderate to large  ascites throughout the abdomen and pelvis. Electronically signed by: Norman Gatlin MD 10/15/2024 07:59 AM EST RP Workstation: [id number]   Medications:  heparin  2,200 Units/hr (10/16/24 0730)    aspirin   81 mg Oral Daily   atorvastatin   40 mg Oral Daily   Chlorhexidine  Gluconate Cloth  6 each Topical Q0600   ezetimibe   10 mg Oral Daily   hydrALAZINE   50 mg Oral Q8H   isosorbide  mononitrate  30 mg Oral Daily   pantoprazole   40 mg Oral Daily   pneumococcal 20-valent conjugate vaccine  0.5 mL Intramuscular Tomorrow-1000   sacubitril -valsartan   1 tablet Oral BID   Dialysis Orders [facility] - MWF 3:15hr, 400/800, EDW 65 kg, TDC, 3K/2.5 calcium .  Heparin : 800 units bolus, 400 units hourly.  Meds: Mircera 150 mcg every 2 weeks (last dose as an outpatient 12/29), Venofer 50 mg q. weekly   Assessment/Plan: CAD, severe multivessel disease: Not a candidate for CABG, on medical therapy. Biventricular HF: With volume on imaging/exam. Trying serial HD but with very long HD census in-house. On Entresto , hydral/iso. ESRD: Usual MWF schedule - for extra HD today, then back to usual schedule tomorrow. HTN/volume: BP good, may have to scale back on something to allow UF - see how goes with HD. Anemia of ESRD: Hgb 10.6 -> 8.8, denies bleeding, not due for ESA  yet Secondary HPTH: Ca/Phos ok - continue home meds Nutrition: Alb low, adding supps. Pt desiring heart-healthy diet rather than renal diet, ok for now - watch labs.     Izetta Boehringer, PA-C 10/16/2024, 8:20 AM  [employer]    "

## 2024-10-16 NOTE — Plan of Care (Signed)

## 2024-10-16 NOTE — Progress Notes (Signed)
 "  Rounding Note   Patient Name: Casey Franco Date of Encounter: 10/16/2024  [facility] Cardiologist: Evalene Lunger, MD   Subjective - No acute events overnight - No complaints today   Scheduled Meds:  (feeding supplement) PROSource Plus  30 mL Oral BID BM   aspirin   81 mg Oral Daily   atorvastatin   40 mg Oral Daily   Chlorhexidine  Gluconate Cloth  6 each Topical Q0600   ezetimibe   10 mg Oral Daily   hydrALAZINE   50 mg Oral Q8H   isosorbide  mononitrate  30 mg Oral Daily   pantoprazole   40 mg Oral Daily   pneumococcal 20-valent conjugate vaccine  0.5 mL Intramuscular Tomorrow-1000   sacubitril -valsartan   1 tablet Oral BID   Continuous Infusions:  heparin  2,200 Units/hr (10/16/24 0730)   PRN Meds: acetaminophen  **OR** acetaminophen , melatonin, ondansetron  (ZOFRAN ) IV   Vital Signs  Vitals:   10/16/24 0005 10/16/24 0453 10/16/24 0937 10/16/24 0942  BP: 110/60 125/72 130/74 131/78  Pulse: 89 92 88 85  Resp: 20 20 18  (!) 21  Temp: 98.9 F (37.2 C) 98.6 F (37 C) 98.4 F (36.9 C)   TempSrc: Oral Oral    SpO2: 98%  98% 98%  Weight:  68.4 kg 69.2 kg   Height:        Intake/Output Summary (Last 24 hours) at 10/16/2024 1041 Last data filed at 10/16/2024 0600 Gross per 24 hour  Intake 1126.51 ml  Output --  Net 1126.51 ml      10/16/2024    9:37 AM 10/16/2024    4:53 AM 10/15/2024    5:41 AM  Last 3 Weights  Weight (lbs) 152 lb 8.9 oz 150 lb 14.4 oz 156 lb 4.9 oz  Weight (kg) 69.2 kg 68.448 kg 70.9 kg      Telemetry NSR- Personally Reviewed  ECG  No new ECG  Physical Exam  GEN: No acute distress, very pleasant Neck: JVD to tragus Cardiac: RRR, II/VI systolic murmur heard throughout the precordium but loudest at the LLSB, no rubs, or gallops.  Respiratory: Bibasilar rales, no wheezes or rhonchi GI: Soft, nontender, abdomen is distended without tenderness to palpation MS: No edema; No deformity. Neuro:  Nonfocal  Psych: Normal affect   Labs High  Sensitivity Troponin:  No results for input(s): TROPONINIHS in the last 720 hours. No results for input(s): TRNPT in the last 720 hours.     Chemistry Recent Labs  Lab 10/11/24 1536 10/12/24 0230 10/14/24 1044 10/15/24 0954 10/16/24 0446  NA  --    < > 134* 135 134*  K  --    < > 3.6 4.0 3.8  CL  --    < > 98 97* 98  CO2  --    < > 22 22 25   GLUCOSE  --    < > 123* 100* 100*  BUN  --    < > 47* 29* 38*  CREATININE  --    < > 5.62* 4.38* 5.37*  CALCIUM   --    < > 8.9 9.1 8.7*  MG  --   --  1.9  --   --   PROT 8.1  --  7.7  --   --   ALBUMIN  3.4*  --  3.3* 3.2* 2.9*  AST 22  --  19  --   --   ALT 7  --  8  --   --   ALKPHOS 151*  --  141*  --   --  BILITOT 0.6  --  0.6  --   --   GFRNONAA  --    < > 11* 14* 11*  ANIONGAP  --    < > 14 17* 11   < > = values in this interval not displayed.    Lipids  Recent Labs  Lab 10/15/24 0954  CHOL 100  TRIG 37  HDL 56  LDLCALC 37  CHOLHDL 1.8    Hematology Recent Labs  Lab 10/14/24 1933 10/15/24 0954 10/16/24 0446  WBC 8.2 7.7 8.1  RBC 3.33* 3.52* 2.95*  HGB 10.0* 10.6* 8.8*  HCT 30.2* 32.5* 27.1*  MCV 90.7 92.3 91.9  MCH 30.0 30.1 29.8  MCHC 33.1 32.6 32.5  RDW 17.8* 18.0* 17.7*  PLT 178 201 168   Thyroid No results for input(s): TSH, FREET4 in the last 168 hours.  BNP Recent Labs  Lab 10/14/24 1044  PROBNP >35,000.0*    DDimer No results for input(s): DDIMER in the last 168 hours.   Radiology  ECHOCARDIOGRAM LIMITED Result Date: 10/15/2024    ECHOCARDIOGRAM LIMITED REPORT   Patient Name:   [name] Date of Exam: 10/15/2024 Medical Rec #:  [id number]       Height:       72.0 in Accession #:    [id number]      Weight:       156.3 lb Date of Birth:  26-Apr-1961        BSA:          1.919 m Patient Age:    63 years        BP:           95/59 mmHg Patient Gender: M               HR:           80 bpm. Exam Location:  Inpatient Procedure: Limited Echo, Color Doppler and Cardiac Doppler (Both Spectral and             Color Flow Doppler were utilized during procedure). Indications:    CHF _- Acute Systolic I50.21  History:        Patient has prior history of Echocardiogram examinations, most                 recent 08/11/2024. Risk Factors:Hypertension.  Sonographer:    Tinnie Gosling RDCS Referring Phys: [phone] DALTON S MCLEAN IMPRESSIONS  1. Left ventricular ejection fraction, by estimation, is 40 to 45%. The left ventricle has mildly decreased function. The left ventricle demonstrates global hypokinesis. There is the interventricular septum is flattened in diastole ('D' shaped left ventricle), consistent with right ventricular volume overload.  2. Right ventricular systolic function is moderately reduced. The right ventricular size is severely enlarged. Tricuspid regurgitation signal is inadequate for assessing PA pressure.  3. The mitral valve is degenerative. Trivial mitral valve regurgitation. No evidence of mitral stenosis. Moderate mitral annular calcification.  4. The aortic valve is calcified. There is severe calcifcation of the aortic valve. There is severe thickening of the aortic valve. Aortic valve regurgitation is not visualized. Aortic valve sclerosis/calcification is present, without any evidence of aortic stenosis.  5. The inferior vena cava is dilated in size with <50% respiratory variability, suggesting right atrial pressure of 15 mmHg. FINDINGS  Left Ventricle: Left ventricular ejection fraction, by estimation, is 40 to 45%. The left ventricle has mildly decreased function. The left ventricle demonstrates global hypokinesis. The left ventricular internal cavity size was normal  in size. There is  no left ventricular hypertrophy. The interventricular septum is flattened in diastole ('D' shaped left ventricle), consistent with right ventricular volume overload. Right Ventricle: The right ventricular size is severely enlarged. No increase in right ventricular wall thickness. Right ventricular systolic function  is moderately reduced. Tricuspid regurgitation signal is inadequate for assessing PA pressure. Left Atrium: Left atrial size was not assessed. Right Atrium: Right atrial size was not assessed. Pericardium: There is no evidence of pericardial effusion. Mitral Valve: The mitral valve is degenerative in appearance. There is mild thickening of the mitral valve leaflet(s). Moderate mitral annular calcification. Trivial mitral valve regurgitation. No evidence of mitral valve stenosis. Tricuspid Valve: The tricuspid valve is normal in structure. Tricuspid valve regurgitation is mild . No evidence of tricuspid stenosis. Aortic Valve: The aortic valve is calcified. There is severe calcifcation of the aortic valve. There is severe thickening of the aortic valve. Aortic valve regurgitation is not visualized. Aortic valve sclerosis/calcification is present, without any evidence of aortic stenosis. Pulmonic Valve: The pulmonic valve was normal in structure. Pulmonic valve regurgitation is not visualized. No evidence of pulmonic stenosis. Aorta: The aortic root is normal in size and structure. Venous: The inferior vena cava is dilated in size with less than 50% respiratory variability, suggesting right atrial pressure of 15 mmHg. IAS/Shunts: No atrial level shunt detected by color flow Doppler. LEFT VENTRICLE PLAX 2D LVIDd:         4.50 cm LVIDs:         3.40 cm LV PW:         1.10 cm LV IVS:        1.00 cm  LV Volumes (MOD) LV vol d, MOD A2C: 138.0 ml LV vol d, MOD A4C: 122.0 ml LV vol s, MOD A2C: 71.2 ml LV vol s, MOD A4C: 77.1 ml LV SV MOD A2C:     66.8 ml LV SV MOD A4C:     122.0 ml LV SV MOD BP:      57.7 ml IVC IVC diam: 2.15 cm Wilbert Bihari MD Electronically signed by Wilbert Bihari MD Signature Date/Time: 10/15/2024/3:49:35 PM    Final    US  Abdomen Limited Result Date: 10/15/2024 EXAM: LIMITED ABDOMINAL ULTRASOUND FOR ASCITES EVALUATION TECHNIQUE: Limited real-time sonography of all 4 quadrants of the abdomen was performed  for evaluation of ascites. COMPARISON: US  Abdomen Limited 01/05/2024. CLINICAL HISTORY: Ascites. FINDINGS: RIGHT UPPER QUADRANT: Moderate to large ascites. LEFT UPPER QUADRANT: Moderate to large ascites. RIGHT LOWER QUADRANT: Moderate to large ascites. LEFT LOWER QUADRANT: Moderate to large ascites. OTHER: Limited visualization of the rest of the abdomen demonstrates no acute abnormality. IMPRESSION: 1. Moderate to large  ascites throughout the abdomen and pelvis. Electronically signed by: Norman Gatlin MD 10/15/2024 07:59 AM EST RP Workstation: [id number]    Cardiac Studies   LHC/RHC 10/11/24:  Conclusions: Severe LMCA and LAD disease with heavy calcification of the coronary arteries, as detailed below. Severely elevated left heart, right heart, and pulmonary artery pressures.  Equalization of end-diastolic pressures noted, which can be seen with restrictive/constrictive physiology. Normal Fick cardiac output/index.  Patient Profile   64 y.o. male with a history of ESRD on HD, HCV with cirrhosis, hx of ascites/varices, PAF, CAD, and HFrEF secondary to ICM.   Assessment & Plan   #Severe Multivessel CAD Including LM Stenosis - Patient had a positive stress test that was obtained for SOB and was found to have severe multivessel CAD including severe LM disease. - The  patient was deemed not a candidate for PCI and was recently seen by CTS and was also considered too high risk for surgery.  CTS recommended medical management. - Surprisingly the patient is chest pain-free and is overall asymptomatic. - Currently on aspirin  and a heparin  gtt. - Although he takes Eliquis  as an outpatient for PAF, will likely need aspirin  on top of this for the most aggressive medical treatment possible. Continue baby aspirin  81 mg daily Continue heparin  drip Continue atorvastatin  40 mg daily Although his LDL is currently 37, I started Zetia  to provide more aggressive lipid control given limited options for  treating his CAD.  #Acute on Chronic HFrEF Exacerbation - Patient with reduced LV systolic function and is currently severely volume overloaded. - Advanced heart failure saw the patient in recommended aggressive UF for volume removal. - Metoprolol  was discontinued to prevent low output state. - The patient is dialysis dependent and thus will need aggressive dialysis for fluid removal. Dialysis per nephrology but would like for daily UF for severe volume overload Continue Entresto  Holding metoprolol  while decompensated Continue hydralazine  50 mg 3 times daily Continue Imdur  30 mg daily  #PAF - Currently NSR and asymptomatic. Continue heparin  gtt. for now Transition to Eliquis  prior to discharge  #HTN - BP stable.  No changes.       For questions or updates, please contact [facility] Please consult [email or web address] for contact info under       Signed, Georganna Archer, MD  10/16/2024, 10:41 AM    "

## 2024-10-16 NOTE — Progress Notes (Addendum)
 "  [facility] PROGRESS NOTE   VIRL COBLE FMW:[id number] DOB: April 29, 1961 DOA: 10/13/2024  PCP: [employer]  Brief History: 64 y.o. male with a PMH significant for chronic HFrEF with LVEF 50% on most recent stress test, ESRD on HD MWF, HTN, HLD, PAF on Eliquis , hepatitis C and liver cirrhosis, presented with worsening of exertional dyspnea. Underwent stress test recently which showed a moderate size severe partially reversible defect involving the anterior wall apex and apical inferior segment consistent with scar and peri-infarct ischemia. LVEF 51%. Presented to [facility] for elective LHC and RHC. Cath found L main stenosis.  Patient was subsequently transferred to [facility] for cardiothoracic surgery input.  Consultants: Cardiology.  Cardiothoracic surgery.  Nephrology  Procedures: Cardiac catheterization    Subjective/Interval History: Patient feels well.  Denies any complaints.  No nausea vomiting.  No chest pain or shortness of breath.    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  Patient was transferred to [facility] for cardiothoracic input. Patient is noted to be on IV heparin .   Patient seen by cardiothoracic surgery.  Case was also discussed by cardiology team.  He is not a candidate for any surgeries or interventions.  Medical management to be pursued. Patient is on aspirin , Zetia , hydralazine  and isosorbide  mononitrate.  His metoprolol  was held due to acute CHF.  Acute on chronic biventricular CHF Echo done in October 2025 showed LVEF of 35 to 40% with severely reduced right ventricular systolic function as well.   Patient was started on Entresto  by cardiology.   Volume is being managed primarily with hemodialysis.  Patient was dialyzed on 1/2.  To be dialyzed again today.   Heart failure team is following.   Echocardiogram  showed LVEF of 40 to 45%.  Low RV function was also noted.    End-stage renal disease on hemodialysis Usually dialyzed Monday Wednesday Friday. Nephrology is following.    Paroxysmal atrial fibrillation Stable.  Was on Eliquis  prior to admission.  Currently on IV heparin .   Metoprolol  on hold due to acute CHF.  Anemia of chronic disease Hemoglobin noted to be 8.8 this morning which is lower from 10.6 yesterday.  Patient denies any bleeding episodes.  Will recheck in the morning.  History of liver cirrhosis/hepatitis C/Ascites Significant ascites noted on US . Proceed with paracentesis.SABRA  DVT Prophylaxis: On IV heparin  Code Status: Full code Family Communication: Discussed with patient Disposition Plan: To be determined   Medications: Scheduled:  (feeding supplement) PROSource Plus  30 mL Oral BID BM   aspirin   81 mg Oral Daily   atorvastatin   40 mg Oral Daily   Chlorhexidine  Gluconate Cloth  6 each Topical Q0600   ezetimibe   10 mg Oral Daily   hydrALAZINE   50 mg Oral Q8H   isosorbide  mononitrate  30 mg Oral Daily   pantoprazole   40 mg Oral Daily   pneumococcal 20-valent conjugate vaccine  0.5 mL Intramuscular Tomorrow-1000   sacubitril -valsartan   1 tablet Oral BID   Continuous:  heparin  2,200 Units/hr (10/16/24 0730)   PRN:acetaminophen  **OR** acetaminophen , melatonin, ondansetron  (ZOFRAN ) IV    Objective:  Vital Signs  Vitals:   10/16/24 0005 10/16/24 0453 10/16/24 0937 10/16/24 0942  BP: 110/60 125/72 130/74 131/78  Pulse: 89 92 88 85  Resp: 20 20 18  (!) 21  Temp: 98.9 F (37.2 C) 98.6 F (37 C) 98.4 F (36.9 C)  TempSrc: Oral Oral    SpO2: 98%  98% 98%  Weight:  68.4 kg 69.2 kg   Height:        Intake/Output Summary (Last 24 hours) at 10/16/2024 1003 Last data filed at 10/16/2024 0600 Gross per 24 hour  Intake 1126.51 ml  Output --  Net 1126.51 ml   Filed Weights   10/15/24 0541 10/16/24 0453 10/16/24 0937  Weight: 70.9 kg 68.4 kg 69.2 kg     General appearance: Awake alert.  In no distress Resp: Clear to auscultation bilaterally.  Normal effort Cardio: S1-S2 is normal regular.  No S3-S4.  No rubs murmurs or bruit GI: Abdomen is soft.  Nontender nondistended.  Bowel sounds are present normal.  No masses organomegaly   Lab Results:  Data Reviewed: I have personally reviewed following labs and reports of the imaging studies  CBC: Recent Labs  Lab 10/13/24 0348 10/14/24 1044 10/14/24 1933 10/15/24 0954 10/16/24 0446  WBC 6.4 6.9 8.2 7.7 8.1  NEUTROABS  --  5.0  --   --   --   HGB 8.8* 9.3* 10.0* 10.6* 8.8*  HCT 26.6* 28.4* 30.2* 32.5* 27.1*  MCV 89.6 90.7 90.7 92.3 91.9  PLT 161 172 178 201 168    Basic Metabolic Panel: Recent Labs  Lab 10/11/24 0842 10/12/24 0230 10/12/24 1307 10/14/24 1044 10/15/24 0954 10/16/24 0446  NA 140 137  --  134* 135 134*  K 3.6 3.7  --  3.6 4.0 3.8  CL  --  101  --  98 97* 98  CO2  --  23  --  22 22 25   GLUCOSE  --  98  --  123* 100* 100*  BUN  --  46*  --  47* 29* 38*  CREATININE  --  5.30*  --  5.62* 4.38* 5.37*  CALCIUM   --  9.1  --  8.9 9.1 8.7*  MG  --   --   --  1.9  --   --   PHOS  --   --  2.1* 4.1 3.4 3.9    GFR: Estimated Creatinine Clearance: 13.6 mL/min (A) (by C-G formula based on SCr of 5.37 mg/dL (H)).  Liver Function Tests: Recent Labs  Lab 10/11/24 1536 10/14/24 1044 10/15/24 0954 10/16/24 0446  AST 22 19  --   --   ALT 7 8  --   --   ALKPHOS 151* 141*  --   --   BILITOT 0.6 0.6  --   --   PROT 8.1 7.7  --   --   ALBUMIN  3.4* 3.3* 3.2* 2.9*    Radiology Studies: ECHOCARDIOGRAM LIMITED Result Date: 10/15/2024    ECHOCARDIOGRAM LIMITED REPORT   Patient Name:   Casey Franco Date of Exam: 10/15/2024 Medical Rec #:  [id number]       Height:       72.0 in Accession #:    [id number]      Weight:       156.3 lb Date of Birth:  January 01, 1961        BSA:          1.919 m Patient Age:    63 years        BP:           95/59 mmHg Patient Gender: M                HR:           80 bpm.  Exam Location:  Inpatient Procedure: Limited Echo, Color Doppler and Cardiac Doppler (Both Spectral and            Color Flow Doppler were utilized during procedure). Indications:    CHF _- Acute Systolic I50.21  History:        Patient has prior history of Echocardiogram examinations, most                 recent 08/11/2024. Risk Factors:Hypertension.  Sonographer:    Tinnie Gosling RDCS Referring Phys: [phone] DALTON S MCLEAN IMPRESSIONS  1. Left ventricular ejection fraction, by estimation, is 40 to 45%. The left ventricle has mildly decreased function. The left ventricle demonstrates global hypokinesis. There is the interventricular septum is flattened in diastole ('D' shaped left ventricle), consistent with right ventricular volume overload.  2. Right ventricular systolic function is moderately reduced. The right ventricular size is severely enlarged. Tricuspid regurgitation signal is inadequate for assessing PA pressure.  3. The mitral valve is degenerative. Trivial mitral valve regurgitation. No evidence of mitral stenosis. Moderate mitral annular calcification.  4. The aortic valve is calcified. There is severe calcifcation of the aortic valve. There is severe thickening of the aortic valve. Aortic valve regurgitation is not visualized. Aortic valve sclerosis/calcification is present, without any evidence of aortic stenosis.  5. The inferior vena cava is dilated in size with <50% respiratory variability, suggesting right atrial pressure of 15 mmHg. FINDINGS  Left Ventricle: Left ventricular ejection fraction, by estimation, is 40 to 45%. The left ventricle has mildly decreased function. The left ventricle demonstrates global hypokinesis. The left ventricular internal cavity size was normal in size. There is  no left ventricular hypertrophy. The interventricular septum is flattened in diastole ('D' shaped left ventricle), consistent with right ventricular volume overload. Right Ventricle:  The right ventricular size is severely enlarged. No increase in right ventricular wall thickness. Right ventricular systolic function is moderately reduced. Tricuspid regurgitation signal is inadequate for assessing PA pressure. Left Atrium: Left atrial size was not assessed. Right Atrium: Right atrial size was not assessed. Pericardium: There is no evidence of pericardial effusion. Mitral Valve: The mitral valve is degenerative in appearance. There is mild thickening of the mitral valve leaflet(s). Moderate mitral annular calcification. Trivial mitral valve regurgitation. No evidence of mitral valve stenosis. Tricuspid Valve: The tricuspid valve is normal in structure. Tricuspid valve regurgitation is mild . No evidence of tricuspid stenosis. Aortic Valve: The aortic valve is calcified. There is severe calcifcation of the aortic valve. There is severe thickening of the aortic valve. Aortic valve regurgitation is not visualized. Aortic valve sclerosis/calcification is present, without any evidence of aortic stenosis. Pulmonic Valve: The pulmonic valve was normal in structure. Pulmonic valve regurgitation is not visualized. No evidence of pulmonic stenosis. Aorta: The aortic root is normal in size and structure. Venous: The inferior vena cava is dilated in size with less than 50% respiratory variability, suggesting right atrial pressure of 15 mmHg. IAS/Shunts: No atrial level shunt detected by color flow Doppler. LEFT VENTRICLE PLAX 2D LVIDd:         4.50 cm LVIDs:         3.40 cm LV PW:         1.10 cm LV IVS:        1.00 cm  LV Volumes (MOD) LV vol d, MOD A2C: 138.0 ml LV vol d, MOD A4C: 122.0 ml LV vol s, MOD A2C: 71.2 ml LV vol s, MOD A4C: 77.1 ml LV SV MOD  A2C:     66.8 ml LV SV MOD A4C:     122.0 ml LV SV MOD BP:      57.7 ml IVC IVC diam: 2.15 cm Wilbert Bihari MD Electronically signed by Wilbert Bihari MD Signature Date/Time: 10/15/2024/3:49:35 PM    Final    US  Abdomen Limited Result Date: 10/15/2024 EXAM:  LIMITED ABDOMINAL ULTRASOUND FOR ASCITES EVALUATION TECHNIQUE: Limited real-time sonography of all 4 quadrants of the abdomen was performed for evaluation of ascites. COMPARISON: US  Abdomen Limited 01/05/2024. CLINICAL HISTORY: Ascites. FINDINGS: RIGHT UPPER QUADRANT: Moderate to large ascites. LEFT UPPER QUADRANT: Moderate to large ascites. RIGHT LOWER QUADRANT: Moderate to large ascites. LEFT LOWER QUADRANT: Moderate to large ascites. OTHER: Limited visualization of the rest of the abdomen demonstrates no acute abnormality. IMPRESSION: 1. Moderate to large  ascites throughout the abdomen and pelvis. Electronically signed by: Norman Gatlin MD 10/15/2024 07:59 AM EST RP Workstation: [id number]       LOS: 3 days   Joette Pebbles  [facility] Pager on [email or web address]  10/16/2024, 10:03 AM   "

## 2024-10-16 NOTE — Progress Notes (Signed)
 PHARMACY - ANTICOAGULATION CONSULT NOTE  Pharmacy Consult for Heparin  Indication: AF  Allergies[1]  Patient Measurements: Height: 6' (182.9 cm) Weight: 68.4 kg (150 lb 14.4 oz) IBW/kg (Calculated) : 77.6  Vital Signs: Temp: 98.6 F (37 C) (01/04 0453) Temp Source: Oral (01/04 0453) BP: 125/72 (01/04 0453) Pulse Rate: 92 (01/04 0453)  Labs: Recent Labs    10/14/24 1044 10/14/24 1933 10/15/24 0954 10/15/24 1951 10/16/24 0446  HGB 9.3* 10.0* 10.6*  --  8.8*  HCT 28.4* 30.2* 32.5*  --  27.1*  PLT 172 178 201  --  168  HEPARINUNFRC  --  0.22* 0.41 0.35 0.45  CREATININE 5.62*  --  4.38*  --  5.37*    Estimated Creatinine Clearance: 13.4 mL/min (A) (by C-G formula based on SCr of 5.37 mg/dL (H)).   Medical History: Past Medical History:  Diagnosis Date   AKI (acute kidney injury) 08/30/2018   Anemia of chronic disease    Chronic systolic heart failure (HCC)    Diabetes mellitus without complication (HCC)    no meds since weight loss   Dialysis patient    Mon, Wed, Fri   ESRD (end stage renal disease) (HCC)    on HD (M,W,F)   Hyperkalemia 02/04/2021   Hypertension    Metabolic acidosis, increased anion gap 02/04/2021   Paroxysmal atrial fibrillation (HCC)    Renal disorder    Sepsis without acute organ dysfunction (HCC) 11/30/2023    Medications:  Medications Prior to Admission  Medication Sig Dispense Refill Last Dose/Taking   [Paused] apixaban  (ELIQUIS ) 5 MG TABS tablet Take 1 tablet (5 mg total) by mouth 2 (two) times daily. 180 tablet 1 10/07/2024   calcium  acetate (PHOSLO ) 667 MG capsule Take 2 capsules (1,334 mg total) by mouth 3 (three) times a week.   10/10/2024   hydrALAZINE  (APRESOLINE ) 25 MG tablet Take 25 mg by mouth 3 (three) times daily.   10/11/2024   hydrOXYzine  (ATARAX ) 25 MG tablet Take 25 mg by mouth 2 (two) times daily.   10/10/2024   Multiple Vitamin (MULTIVITAMIN ADULT PO) Take 1 tablet by mouth daily.   10/10/2024   omeprazole  (PRILOSEC)  20 MG capsule Take 20 mg by mouth every morning.   10/10/2024   tamsulosin  (FLOMAX ) 0.4 MG CAPS capsule Take 0.4 mg by mouth daily.   10/10/2024   albumin  human 25 % bottle Inject into the vein. (Patient not taking: Reported on 10/14/2024)   Not Taking   amLODipine  (NORVASC ) 10 MG tablet Take 10 mg by mouth daily. (Patient not taking: Reported on 10/14/2024)   Not Taking   atorvastatin  (LIPITOR) 40 MG tablet Take 1 tablet (40 mg total) by mouth daily.      epoetin  alfa-epbx (RETACRIT ) 10000 UNIT/ML injection Inject 1 mL (10,000 Units total) into the vein every Monday, Wednesday, and Friday at 6 PM. (Patient not taking: Reported on 10/14/2024)   Not Taking   feeding supplement (ENSURE PLUS HIGH PROTEIN) LIQD Take 237 mLs by mouth 2 (two) times daily between meals.      heparin  25000 UT/250ML infusion Inject 1,350 Units/hr into the vein continuous.      irbesartan  (AVAPRO ) 300 MG tablet Take 300 mg by mouth daily. (Patient not taking: Reported on 10/14/2024)   Not Taking   melatonin 5 MG TABS Take 5 mg by mouth at bedtime as needed (sleep). (Patient not taking: Reported on 10/14/2024)   Not Taking   metoprolol  tartrate (LOPRESSOR ) 25 MG tablet Take 1 tablet (25 mg  total) by mouth 2 (two) times daily. (Patient not taking: Reported on 10/14/2024)   Not Taking   sacubitril -valsartan  (ENTRESTO ) 49-51 MG Take 1 tablet by mouth 2 (two) times daily. 60 tablet 3     Assessment: 64 y/o M with a h/o AF on Eliquis  recently increased to 5 mg bid due to stroke risk (CHA2DS2/VAS of 3) but previously 2.5 mg bid subtherapeutic dose for cirrhosis/ESRD increased bleeding risk. LHC with LM disease. Pharmacy consulted for heparin  bridging .    1/4 Update: heparin  level therapeutic at 0.45 on infusion of 2250 units/h. Hgb stable and platelets WNL. No issues with heparin  infusion or bleeding reported.     Goal of Therapy:  Heparin  level 0.3-0.7 units/ml aPTT 66-102 seconds Monitor platelets by anticoagulation protocol: Yes    Plan:  Continue heparin  at 2200 units/h Daily CBC, heparin  level Follow up plan for changing back to Eliquis   Rocky Slade, PharmD, BCPS Maurilio Patten, PharmD PGY1 Pharmacy Resident [facility] 10/16/2024 9:27 AM       [1] No Known Allergies

## 2024-10-17 ENCOUNTER — Inpatient Hospital Stay (HOSPITAL_COMMUNITY)

## 2024-10-17 DIAGNOSIS — I2510 Atherosclerotic heart disease of native coronary artery without angina pectoris: Secondary | ICD-10-CM | POA: Diagnosis not present

## 2024-10-17 DIAGNOSIS — I509 Heart failure, unspecified: Secondary | ICD-10-CM

## 2024-10-17 DIAGNOSIS — I5023 Acute on chronic systolic (congestive) heart failure: Secondary | ICD-10-CM | POA: Diagnosis not present

## 2024-10-17 DIAGNOSIS — N186 End stage renal disease: Secondary | ICD-10-CM | POA: Diagnosis not present

## 2024-10-17 DIAGNOSIS — R188 Other ascites: Secondary | ICD-10-CM

## 2024-10-17 DIAGNOSIS — I480 Paroxysmal atrial fibrillation: Secondary | ICD-10-CM | POA: Diagnosis not present

## 2024-10-17 HISTORY — PX: IR PARACENTESIS: IMG2679

## 2024-10-17 LAB — CBC
HCT: 27.5 % — ABNORMAL LOW (ref 39.0–52.0)
Hemoglobin: 8.9 g/dL — ABNORMAL LOW (ref 13.0–17.0)
MCH: 30.1 pg (ref 26.0–34.0)
MCHC: 32.4 g/dL (ref 30.0–36.0)
MCV: 92.9 fL (ref 80.0–100.0)
Platelets: 167 K/uL (ref 150–400)
RBC: 2.96 MIL/uL — ABNORMAL LOW (ref 4.22–5.81)
RDW: 17.8 % — ABNORMAL HIGH (ref 11.5–15.5)
WBC: 10 K/uL (ref 4.0–10.5)
nRBC: 0 % (ref 0.0–0.2)

## 2024-10-17 LAB — BODY FLUID CELL COUNT WITH DIFFERENTIAL
Eos, Fluid: 0 %
Lymphs, Fluid: 13 %
Monocyte-Macrophage-Serous Fluid: 86 % (ref 50–90)
Neutrophil Count, Fluid: 1 % (ref 0–25)
Total Nucleated Cell Count, Fluid: 85 uL (ref 0–1000)

## 2024-10-17 LAB — GRAM STAIN

## 2024-10-17 LAB — HEPARIN LEVEL (UNFRACTIONATED): Heparin Unfractionated: 0.38 [IU]/mL (ref 0.30–0.70)

## 2024-10-17 MED ORDER — APIXABAN 5 MG PO TABS: 5.0000 mg | ORAL_TABLET | Freq: Two times a day (BID) | ORAL | Status: DC

## 2024-10-17 MED ORDER — LIDOCAINE-EPINEPHRINE 1 %-1:100000 IJ SOLN
INTRAMUSCULAR | Status: AC
  Filled 2024-10-17: qty 20

## 2024-10-17 MED ORDER — APIXABAN 5 MG PO TABS
5.0000 mg | ORAL_TABLET | Freq: Two times a day (BID) | ORAL | Status: DC
  Administered 2024-10-17: 5 mg via ORAL
  Filled 2024-10-17: qty 1

## 2024-10-17 MED ORDER — LIDOCAINE-EPINEPHRINE 1 %-1:100000 IJ SOLN
20.0000 mL | Freq: Once | INTRAMUSCULAR | Status: AC
  Administered 2024-10-17: 10 mL via INTRADERMAL

## 2024-10-17 NOTE — TOC Progression Note (Signed)
 Transition of Care [facility]) - Progression Note    Patient Details  Name: Casey Franco MRN: [id number] Date of Birth: Feb 22, 1961  [facility]) CM/SW Contact  Arlana JINNY Nicholaus ISRA[place] Phone Number: [phone] 10/17/2024, 2:15 PM  Clinical Narrative:   HF CSW reviewed patients chart for discharge readiness, patient not medically stable for d/c. ICM will continue to follow.   HF CSW/CM will continue to follow and monitor for dc readiness.       Expected Discharge Plan: Home/Self Care Barriers to Discharge: No Barriers Identified               Expected Discharge Plan and Services In-house Referral: NA Discharge Planning Services: CM Consult Post Acute Care Choice: NA Living arrangements for the past 2 months: Single Family Home                   DME Agency: NA       HH Arranged: NA           Social Drivers of Health (SDOH) Interventions SDOH Screenings   Food Insecurity: Patient Declined (10/14/2024)  Housing: Low Risk (10/14/2024)  Transportation Needs: No Transportation Needs (10/14/2024)  Utilities: Not At Risk (10/14/2024)  Tobacco Use: Medium Risk (10/13/2024)    Readmission Risk Interventions    10/14/2024   11:47 AM  Readmission Risk Prevention Plan  Transportation Screening Complete  HRI or Home Care Consult Complete  Social Work Consult for Recovery Care Planning/Counseling Complete  Palliative Care Screening Not Applicable  Medication Review Oceanographer) Referral to Pharmacy

## 2024-10-17 NOTE — Progress Notes (Signed)
 "  [facility] PROGRESS NOTE   Casey Franco FMW:[id number] DOB: 1960-10-24 DOA: 10/13/2024  PCP: [employer]  Brief History: 64 y.o. male with a PMH significant for chronic HFrEF with LVEF 50% on most recent stress test, ESRD on HD MWF, HTN, HLD, PAF on Eliquis , hepatitis C and liver cirrhosis, presented with worsening of exertional dyspnea. Underwent stress test recently which showed a moderate size severe partially reversible defect involving the anterior wall apex and apical inferior segment consistent with scar and peri-infarct ischemia. LVEF 51%. Presented to [facility] for elective LHC and RHC. Cath found L main stenosis.  Patient was subsequently transferred to [facility] for cardiothoracic surgery input.  Consultants: Cardiology.  Cardiothoracic surgery.  Nephrology  Procedures: Cardiac catheterization.  Hemodialysis.  Paracentesis    Subjective/Interval History: Patient complaining of mild abdominal pain after his paracentesis.  Denies any chest pain shortness of breath.  No nausea vomiting.    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  Patient was transferred to [facility] for cardiothoracic input. Patient seen by cardiothoracic surgery.  Case was also discussed by cardiology team.  He is not a candidate for any surgeries or interventions.  Medical management to be pursued. Patient is on aspirin , Zetia , hydralazine  and isosorbide  mononitrate.  His metoprolol  was held due to acute CHF. Stable from cardiac standpoint.  Noted to be on IV heparin  per cardiology.  Acute on chronic biventricular CHF Echo done in October 2025 showed LVEF of 35 to 40% with severely reduced right ventricular systolic function as well.   Patient was started on Entresto  by cardiology.   Volume is being managed primarily with hemodialysis.  Patient was  dialyzed on 1/2, 1/4.   Heart failure team is following.   Echocardiogram showed LVEF of 40 to 45%.  Low RV function was also noted.    End-stage renal disease on hemodialysis Usually dialyzed Monday Wednesday Friday. Nephrology is following.    Paroxysmal atrial fibrillation Stable.  Was on Eliquis  prior to admission.  Currently on IV heparin .   Metoprolol  on hold due to acute CHF.  Anemia of chronic disease Hemoglobin dropped from 10.6-8.8 yesterday.  Noted to be 8.9 this morning.  No evidence of overt bleeding.  Likely secondary to fluid shift or dilution.    History of liver cirrhosis/hepatitis C/Ascites Significant ascites noted on US .  Underwent paracentesis this morning.  Await fluid analysis.  Mild abdominal pain mentioned although abdomen is benign.  DVT Prophylaxis: On IV heparin  Code Status: Full code Family Communication: Discussed with patient Disposition Plan: To be determined   Medications: Scheduled:  (feeding supplement) PROSource Plus  30 mL Oral BID BM   aspirin   81 mg Oral Daily   atorvastatin   40 mg Oral Daily   Chlorhexidine  Gluconate Cloth  6 each Topical Q0600   ezetimibe   10 mg Oral Daily   hydrALAZINE   50 mg Oral Q8H   isosorbide  mononitrate  30 mg Oral Daily   pantoprazole   40 mg Oral Daily   pneumococcal 20-valent conjugate vaccine  0.5 mL Intramuscular Tomorrow-1000   sacubitril -valsartan   1 tablet Oral BID   Continuous:  heparin  2,200 Units/hr (10/17/24 1012)   PRN:acetaminophen  **OR** acetaminophen , melatonin, ondansetron  (ZOFRAN ) IV    Objective:  Vital Signs  Vitals:   10/17/24 0440 10/17/24 0837 10/17/24 0845 10/17/24 0906  BP: 108/75 111/71 124/73 131/77  Pulse: 95   95  Resp: 20   17  Temp: 99.5 F (37.5 C)   99.2 F (37.3 C)  TempSrc: Oral   Oral  SpO2:    94%  Weight: 64.8 kg     Height:        Intake/Output Summary (Last 24 hours) at 10/17/2024 1058 Last data filed at 10/17/2024 0700 Gross per 24 hour  Intake 788.09 ml   Output 4500 ml  Net -3711.91 ml   Filed Weights   10/16/24 0937 10/16/24 1307 10/17/24 0440  Weight: 69.2 kg 64.5 kg 64.8 kg    General appearance: Awake alert.  In no distress Resp: Clear to auscultation bilaterally.  Normal effort Cardio: S1-S2 is normal regular.  No S3-S4.  No rubs murmurs or bruit GI: Abdomen is soft.  Less distended compared to yesterday.  Mildly tender around the paracentesis site.  No rebound rigidity or guarding. Extremities: No edema.  Full range of motion of lower extremities. Neurologic: Alert and oriented x3.  No focal neurological deficits.    Lab Results:  Data Reviewed: I have personally reviewed following labs and reports of the imaging studies  CBC: Recent Labs  Lab 10/14/24 1044 10/14/24 1933 10/15/24 0954 10/16/24 0446 10/17/24 0517  WBC 6.9 8.2 7.7 8.1 10.0  NEUTROABS 5.0  --   --   --   --   HGB 9.3* 10.0* 10.6* 8.8* 8.9*  HCT 28.4* 30.2* 32.5* 27.1* 27.5*  MCV 90.7 90.7 92.3 91.9 92.9  PLT 172 178 201 168 167    Basic Metabolic Panel: Recent Labs  Lab 10/11/24 0842 10/12/24 0230 10/12/24 1307 10/14/24 1044 10/15/24 0954 10/16/24 0446  NA 140 137  --  134* 135 134*  K 3.6 3.7  --  3.6 4.0 3.8  CL  --  101  --  98 97* 98  CO2  --  23  --  22 22 25   GLUCOSE  --  98  --  123* 100* 100*  BUN  --  46*  --  47* 29* 38*  CREATININE  --  5.30*  --  5.62* 4.38* 5.37*  CALCIUM   --  9.1  --  8.9 9.1 8.7*  MG  --   --   --  1.9  --   --   PHOS  --   --  2.1* 4.1 3.4 3.9    GFR: Estimated Creatinine Clearance: 12.7 mL/min (A) (by C-G formula based on SCr of 5.37 mg/dL (H)).  Liver Function Tests: Recent Labs  Lab 10/11/24 1536 10/14/24 1044 10/15/24 0954 10/16/24 0446  AST 22 19  --   --   ALT 7 8  --   --   ALKPHOS 151* 141*  --   --   BILITOT 0.6 0.6  --   --   PROT 8.1 7.7  --   --   ALBUMIN  3.4* 3.3* 3.2* 2.9*    Radiology Studies: IR Paracentesis Result Date: 10/17/2024 INDICATION: 64 year old male. History  HCV and end-stage renal disease with recurrent ascites. Request is for therapeutic and diagnostic paracentesis. EXAM: ULTRASOUND GUIDED THERAPEUTIC AND DIAGNOSTIC PARACENTESIS MEDICATIONS: Lidocaine  1% 10 mL COMPLICATIONS: None immediate. PROCEDURE: Informed written consent was obtained from the patient after a discussion of the risks, benefits and alternatives to treatment. A timeout was performed prior to the initiation of the procedure. Initial ultrasound scanning demonstrates a small amount of ascites within the left lower abdominal quadrant. The left lower abdomen was prepped and draped in the usual sterile fashion. 1%  lidocaine  was used for local anesthesia. Following this, a 19 gauge, 7-cm, Yueh catheter was introduced. An ultrasound image was saved for documentation purposes. The paracentesis was performed. The catheter was removed and a dressing was applied. The patient tolerated the procedure well without immediate post procedural complication. FINDINGS: A total of approximately 1.9 L of straw-colored fluid was removed. Samples were sent to the laboratory as requested by the clinical team. IMPRESSION: Successful ultrasound-guided therapeutic and diagnostic paracentesis yielding 1.9 L of peritoneal fluid. Performed by Delon Beagle NP PLAN: If the patient eventually requires >/=2 paracenteses in a 30 day period, candidacy for formal evaluation by the [facility] will be assessed. Thom Hall, MD [facility] Electronically Signed   By: Thom Hall M.D.   On: 10/17/2024 10:22       LOS: 4 days   Casey Franco  [facility] Pager on [email or web address]  10/17/2024, 10:58 AM   "

## 2024-10-17 NOTE — Progress Notes (Addendum)
 PHARMACY - ANTICOAGULATION CONSULT NOTE  Pharmacy Consult for Heparin  Indication: AF  Allergies[1]  Patient Measurements: Height: 6' (182.9 cm) Weight: 64.8 kg (142 lb 12.8 oz) IBW/kg (Calculated) : 77.6  Vital Signs: Temp: 99.5 F (37.5 C) (01/05 0440) Temp Source: Oral (01/05 0440) BP: 108/75 (01/05 0440) Pulse Rate: 95 (01/05 0440)  Labs: Recent Labs    10/14/24 1044 10/14/24 1933 10/15/24 0954 10/15/24 1951 10/16/24 0446 10/17/24 0517  HGB 9.3*   < > 10.6*  --  8.8* 8.9*  HCT 28.4*   < > 32.5*  --  27.1* 27.5*  PLT 172   < > 201  --  168 167  HEPARINUNFRC  --    < > 0.41 0.35 0.45 0.38  CREATININE 5.62*  --  4.38*  --  5.37*  --    < > = values in this interval not displayed.    Estimated Creatinine Clearance: 12.7 mL/min (A) (by C-G formula based on SCr of 5.37 mg/dL (H)).   Medical History: Past Medical History:  Diagnosis Date   AKI (acute kidney injury) 08/30/2018   Anemia of chronic disease    Chronic systolic heart failure (HCC)    Diabetes mellitus without complication (HCC)    no meds since weight loss   Dialysis patient    Mon, Wed, Fri   ESRD (end stage renal disease) (HCC)    on HD (M,W,F)   Hyperkalemia 02/04/2021   Hypertension    Metabolic acidosis, increased anion gap 02/04/2021   Paroxysmal atrial fibrillation (HCC)    Renal disorder    Sepsis without acute organ dysfunction (HCC) 11/30/2023    Medications:  Medications Prior to Admission  Medication Sig Dispense Refill Last Dose/Taking   [Paused] apixaban  (ELIQUIS ) 5 MG TABS tablet Take 1 tablet (5 mg total) by mouth 2 (two) times daily. 180 tablet 1 10/07/2024   calcium  acetate (PHOSLO ) 667 MG capsule Take 2 capsules (1,334 mg total) by mouth 3 (three) times a week.   10/10/2024   hydrALAZINE  (APRESOLINE ) 25 MG tablet Take 25 mg by mouth 3 (three) times daily.   10/11/2024   hydrOXYzine  (ATARAX ) 25 MG tablet Take 25 mg by mouth 2 (two) times daily.   10/10/2024   Multiple Vitamin  (MULTIVITAMIN ADULT PO) Take 1 tablet by mouth daily.   10/10/2024   omeprazole  (PRILOSEC) 20 MG capsule Take 20 mg by mouth every morning.   10/10/2024   tamsulosin  (FLOMAX ) 0.4 MG CAPS capsule Take 0.4 mg by mouth daily.   10/10/2024   albumin  human 25 % bottle Inject into the vein. (Patient not taking: Reported on 10/14/2024)   Not Taking   amLODipine  (NORVASC ) 10 MG tablet Take 10 mg by mouth daily. (Patient not taking: Reported on 10/14/2024)   Not Taking   atorvastatin  (LIPITOR) 40 MG tablet Take 1 tablet (40 mg total) by mouth daily.      epoetin  alfa-epbx (RETACRIT ) 10000 UNIT/ML injection Inject 1 mL (10,000 Units total) into the vein every Monday, Wednesday, and Friday at 6 PM. (Patient not taking: Reported on 10/14/2024)   Not Taking   feeding supplement (ENSURE PLUS HIGH PROTEIN) LIQD Take 237 mLs by mouth 2 (two) times daily between meals.      heparin  25000 UT/250ML infusion Inject 1,350 Units/hr into the vein continuous.      irbesartan  (AVAPRO ) 300 MG tablet Take 300 mg by mouth daily. (Patient not taking: Reported on 10/14/2024)   Not Taking   melatonin 5 MG TABS Take  5 mg by mouth at bedtime as needed (sleep). (Patient not taking: Reported on 10/14/2024)   Not Taking   metoprolol  tartrate (LOPRESSOR ) 25 MG tablet Take 1 tablet (25 mg total) by mouth 2 (two) times daily. (Patient not taking: Reported on 10/14/2024)   Not Taking   sacubitril -valsartan  (ENTRESTO ) 49-51 MG Take 1 tablet by mouth 2 (two) times daily. 60 tablet 3     Assessment: 64 y/o M with a h/o AF on Eliquis  recently increased to 5 mg bid due to stroke risk (CHA2DS2/VAS of 3) but previously 2.5 mg bid subtherapeutic dose for cirrhosis/ESRD increased bleeding risk. LHC with LM disease. Pharmacy consulted for heparin  bridging.    Heparin  level remains therapeutic, CBC ok.    Goal of Therapy:  Heparin  level 0.3-0.7 units/ml aPTT 66-102 seconds Monitor platelets by anticoagulation protocol: Yes   Plan:  Continue heparin  at  2200 units/h Daily CBC, heparin  level Follow up plan for changing back to Eliquis    Ozell Jamaica, PharmD, BCPS, [facility] Clinical Pharmacist [phone] Please check AMION for all [facility] numbers 10/17/2024   1/5 afternoon update - OK to transition to PTA Eliquis  this evening.  Stop heparin  gtt @2200  and give Eliquis  5 mg at the same time.  Maurilio Fila, PharmD Clinical Pharmacist 10/17/2024  12:15 PM       [1] No Known Allergies

## 2024-10-17 NOTE — Care Management Important Message (Signed)
 Important Message  Patient Details  Name: Casey Franco MRN: [id number] Date of Birth: [birth year]   Important Message Given:  Yes - Medicare IM     [name] Sharps 10/17/2024, 10:48 AM

## 2024-10-17 NOTE — Progress Notes (Addendum)
 "    Advanced Heart Failure Rounding Note  Cardiologist: Evalene Lunger, MD   Patient Profile   Casey Franco is a 64 y.o. male with history of ESRD on HD, HCV with cirrhosis and ascites/varices, PAF, CAD, ICM.   Admitted after outpatient R/LHC to discuss revascularization options and management of marked volume overload.  Subjective:    Going for paracentesis today.  Last HD on 01/04.  Feeling well. Great appetite. No concerns.   Objective:   Weight Range: 64.8 kg Body mass index is 19.37 kg/m.   Vital Signs:   Temp:  [98.5 F (36.9 C)-99.5 F (37.5 C)] 99.2 F (37.3 C) (01/05 0906) Pulse Rate:  [85-100] 95 (01/05 0906) Resp:  [14-22] 17 (01/05 0906) BP: (83-143)/(60-90) 131/77 (01/05 0906) SpO2:  [91 %-100 %] 94 % (01/05 0906) Weight:  [64.5 kg-64.8 kg] 64.8 kg (01/05 0440) Last BM Date : 10/16/24  Weight change: Filed Weights   10/16/24 0937 10/16/24 1307 10/17/24 0440  Weight: 69.2 kg 64.5 kg 64.8 kg    Intake/Output:   Intake/Output Summary (Last 24 hours) at 10/17/2024 1002 Last data filed at 10/17/2024 0700 Gross per 24 hour  Intake 788.09 ml  Output 4500 ml  Net -3711.91 ml     Physical Exam   General:  Chronically ill appearing.   Cor: Regular rate & rhythm. No murmurs. JVP to midneck Lungs: breathing nonlabored Extremities: no edema   Telemetry   SR 90s  Labs   CBC Recent Labs    10/14/24 1044 10/14/24 1933 10/16/24 0446 10/17/24 0517  WBC 6.9   < > 8.1 10.0  NEUTROABS 5.0  --   --   --   HGB 9.3*   < > 8.8* 8.9*  HCT 28.4*   < > 27.1* 27.5*  MCV 90.7   < > 91.9 92.9  PLT 172   < > 168 167   < > = values in this interval not displayed.   Basic Metabolic Panel Recent Labs    98/97/73 1044 10/15/24 0954 10/16/24 0446  NA 134* 135 134*  K 3.6 4.0 3.8  CL 98 97* 98  CO2 22 22 25   GLUCOSE 123* 100* 100*  BUN 47* 29* 38*  CREATININE 5.62* 4.38* 5.37*  CALCIUM  8.9 9.1 8.7*  MG 1.9  --   --   PHOS 4.1 3.4 3.9   Liver  Function Tests Recent Labs    10/14/24 1044 10/15/24 0954 10/16/24 0446  AST 19  --   --   ALT 8  --   --   ALKPHOS 141*  --   --   BILITOT 0.6  --   --   PROT 7.7  --   --   ALBUMIN  3.3* 3.2* 2.9*   No results for input(s): LIPASE, AMYLASE in the last 72 hours. Cardiac Enzymes No results for input(s): CKTOTAL, CKMB, CKMBINDEX, TROPONINI in the last 72 hours.  BNP: BNP (last 3 results) No results for input(s): BNP in the last 8760 hours.  ProBNP (last 3 results) Recent Labs    10/14/24 1044  PROBNP >35,000.0*     D-Dimer No results for input(s): DDIMER in the last 72 hours. Hemoglobin A1C No results for input(s): HGBA1C in the last 72 hours. Fasting Lipid Panel Recent Labs    10/15/24 0954  CHOL 100  HDL 56  LDLCALC 37  TRIG 37  CHOLHDL 1.8   Medications:   Scheduled Medications:  (feeding supplement) PROSource Plus  30 mL  Oral BID BM   aspirin   81 mg Oral Daily   atorvastatin   40 mg Oral Daily   Chlorhexidine  Gluconate Cloth  6 each Topical Q0600   ezetimibe   10 mg Oral Daily   hydrALAZINE   50 mg Oral Q8H   isosorbide  mononitrate  30 mg Oral Daily   pantoprazole   40 mg Oral Daily   pneumococcal 20-valent conjugate vaccine  0.5 mL Intramuscular Tomorrow-1000   sacubitril -valsartan   1 tablet Oral BID    Infusions:  heparin  2,200 Units/hr (10/17/24 0505)    PRN Medications: acetaminophen  **OR** acetaminophen , melatonin, ondansetron  (ZOFRAN ) IV  Assessment/Plan   1. CAD: LHC on 10/11/24 showed 75% LM stenosis, 75% ostial LAD, occluded D2, and 99% mLAD.   Not an ACS situation.  He had a Cardiolite with partially reversible anterior defect.  No chest pain.  Not a candidate for PCI or CABG - Continue ASA 81  - Continue statin + Zetoa  2. Acute on chronic systolic CHF: Echo in 10/25 showed EF 35-40%, global HK, severe RV dysfunction and severe RV enlargement, moderate-severe MR.  Suspect ischemic cardiomyopathy.  RHC on 12/30 showed  mean RA 33, PA 78/40, mean PCWP 32, CI 3.6.  Marked volume overload with biventricular failure.     - Volume managed with HD - Can continue Entresto  49/51 bid.  - Continue hydralazine  50 tid and Imdur  30 mg daily.  - off beta blocker with low EF and marked volume overload.   3. ESRD: As above, markedly volume overloaded on admit. Volume management with HD. Serial HD difficult with high census  4. Cirrhosis: From HCV.  Suspect ascites with distended abdomen.  RV failure and volume overload likely plays a role as well with liver congestion  - Going for paracentesis today  5. Atrial fibrillation: Paroxysmal.  He is on heparin  gtt currently.  He is in NSR.  Poor amiodarone candidate with cirrhosis.  - ON heparin  gtt >> back to eliquis  tonight after paracentesis  6. HTN: BP reasonable - Watch for hypotension during HD - Meds as above   HF will follow at a distance. Has outpatient follow-up scheduled with Cardiology in [place]. He will not need outpatient follow-up with Advanced Heart Failure.     Length of Stay: 4  Lakeidra Reliford N, PA-C  10/17/2024, 10:02 AM  Advanced Heart Failure Team   Please visit [email or web address]: For overnight coverage please call cardiology fellow first. If fellow not available call Shock/ECMO MD on call.  For ECMO / Mechanical Support (Impella, IABP, LVAD) issues call Shock / ECMO MD on call.   "

## 2024-10-17 NOTE — Procedures (Signed)
 Ultrasound-guided diagnostic and therapeutic paracentesis performed yielding 1.9 liters of straw colored fluid.  Fluid was sent to lab for analysis. No immediate complications. EBL is none.

## 2024-10-17 NOTE — Progress Notes (Signed)
 " [facility] Progress Note   Subjective:  Seen in room - s/p 1.9L paracentesis this AM. BP stable, no dyspnea or CP. S/p HD yesterday (extra). Technically due for HD today, but wants to give his body a break - so plan for HD later tonight or possibly tomorrow AM.  Objective Vitals:   10/17/24 0440 10/17/24 0837 10/17/24 0845 10/17/24 0906  BP: 108/75 111/71 124/73 131/77  Pulse: 95   95  Resp: 20   17  Temp: 99.5 F (37.5 C)   99.2 F (37.3 C)  TempSrc: Oral   Oral  SpO2:    94%  Weight: 64.8 kg     Height:       Physical Exam General: Well appearing, NAD. Room air Heart: RRR Lungs: CTA upper lobes, bibasilar rales Abdomen: soft, less distended Extremities: no LE edema Dialysis Access: [facility]  Additional Objective Labs: Basic Metabolic Panel: Recent Labs  Lab 10/14/24 1044 10/15/24 0954 10/16/24 0446  NA 134* 135 134*  K 3.6 4.0 3.8  CL 98 97* 98  CO2 22 22 25   GLUCOSE 123* 100* 100*  BUN 47* 29* 38*  CREATININE 5.62* 4.38* 5.37*  CALCIUM  8.9 9.1 8.7*  PHOS 4.1 3.4 3.9   Liver Function Tests: Recent Labs  Lab 10/11/24 1536 10/14/24 1044 10/15/24 0954 10/16/24 0446  AST 22 19  --   --   ALT 7 8  --   --   ALKPHOS 151* 141*  --   --   BILITOT 0.6 0.6  --   --   PROT 8.1 7.7  --   --   ALBUMIN  3.4* 3.3* 3.2* 2.9*   CBC: Recent Labs  Lab 10/14/24 1044 10/14/24 1933 10/15/24 0954 10/16/24 0446 10/17/24 0517  WBC 6.9 8.2 7.7 8.1 10.0  NEUTROABS 5.0  --   --   --   --   HGB 9.3* 10.0* 10.6* 8.8* 8.9*  HCT 28.4* 30.2* 32.5* 27.1* 27.5*  MCV 90.7 90.7 92.3 91.9 92.9  PLT 172 178 201 168 167   Blood Culture    Component Value Date/Time   SDES  01/05/2024 1205    PERITONEAL Performed at [facility], [address] [place], [place] [address]    [facility]  01/05/2024 1205    NONE Performed at [facility], [address] [place], [place] [address]    CULT  01/05/2024 1205    MIXED ANAEROBIC FLORA PRESENT.   CALL LAB IF FURTHER IID REQUIRED.   REPTSTATUS 01/07/2024 FINAL 01/05/2024 1205   Studies/Results: IR Paracentesis Result Date: 10/17/2024 INDICATION: 64 year old male. History HCV and end-stage renal disease with recurrent ascites. Request is for therapeutic and diagnostic paracentesis. EXAM: ULTRASOUND GUIDED THERAPEUTIC AND DIAGNOSTIC PARACENTESIS MEDICATIONS: Lidocaine  1% 10 mL COMPLICATIONS: None immediate. PROCEDURE: Informed written consent was obtained from the patient after a discussion of the risks, benefits and alternatives to treatment. A timeout was performed prior to the initiation of the procedure. Initial ultrasound scanning demonstrates a small amount of ascites within the left lower abdominal quadrant. The left lower abdomen was prepped and draped in the usual sterile fashion. 1% lidocaine  was used for local anesthesia. Following this, a 19 gauge, 7-cm, Yueh catheter was introduced. An ultrasound image was saved for documentation purposes. The paracentesis was performed. The catheter was removed and a dressing was applied. The patient tolerated the procedure well without immediate post procedural complication. FINDINGS: A total of approximately 1.9 L of straw-colored fluid was removed. Samples were  sent to the laboratory as requested by the clinical team. IMPRESSION: Successful ultrasound-guided therapeutic and diagnostic paracentesis yielding 1.9 L of peritoneal fluid. Performed by Delon Beagle NP PLAN: If the patient eventually requires >/=2 paracenteses in a 30 day period, candidacy for formal evaluation by the [facility] will be assessed. Thom Hall, MD [facility] Electronically Signed   By: Thom Hall M.D.   On: 10/17/2024 10:22   Medications:  heparin  2,200 Units/hr (10/17/24 1012)    (feeding supplement) PROSource Plus  30 mL Oral BID BM   aspirin   81 mg Oral  Daily   atorvastatin   40 mg Oral Daily   Chlorhexidine  Gluconate Cloth  6 each Topical Q0600   ezetimibe   10 mg Oral Daily   hydrALAZINE   50 mg Oral Q8H   isosorbide  mononitrate  30 mg Oral Daily   pantoprazole   40 mg Oral Daily   pneumococcal 20-valent conjugate vaccine  0.5 mL Intramuscular Tomorrow-1000   sacubitril -valsartan   1 tablet Oral BID    Dialysis Orders [facility] - MWF 3:15hr, 400/800, EDW 65 kg, TDC, 3K/2.5 calcium .  Heparin : 800 units bolus, 400 units hourly.  Meds: Mircera 150 mcg every 2 weeks (last dose as an outpatient 12/29), Venofer 50 mg q. weekly   Assessment/Plan: CAD, severe multivessel disease: Not a candidate for CABG, on medical therapy. Biventricular HF: With volume on imaging/exam. Trying serial HD but with very long HD census in-house. On Entresto , hydral/iso. S/p 1.9L paracentesis this AM (studies pending). ESRD: Usual MWF schedule. S/p extra HD 1/4, for next HD either tonight or tomorrow AM. HTN/volume: BP good,may have to scale back on something to allow UF - see how goes with HD. Anemia of ESRD: Hgb 8.9, denies bleeding, not due for ESA yet Secondary HPTH: Ca/Phos ok - continue home meds Nutrition: Alb low, continue supps. Pt desiring heart-healthy diet rather than renal diet, ok for now - watch labs.   Izetta Boehringer, PA-C 10/17/2024, 12:11 PM  [facility]    "

## 2024-10-17 NOTE — Plan of Care (Signed)

## 2024-10-18 ENCOUNTER — Ambulatory Visit: Admitting: Physician Assistant

## 2024-10-18 ENCOUNTER — Inpatient Hospital Stay (HOSPITAL_COMMUNITY)

## 2024-10-18 DIAGNOSIS — R188 Other ascites: Secondary | ICD-10-CM | POA: Diagnosis not present

## 2024-10-18 DIAGNOSIS — R933 Abnormal findings on diagnostic imaging of other parts of digestive tract: Secondary | ICD-10-CM

## 2024-10-18 DIAGNOSIS — I480 Paroxysmal atrial fibrillation: Secondary | ICD-10-CM | POA: Diagnosis not present

## 2024-10-18 DIAGNOSIS — Z992 Dependence on renal dialysis: Secondary | ICD-10-CM | POA: Diagnosis not present

## 2024-10-18 DIAGNOSIS — I2510 Atherosclerotic heart disease of native coronary artery without angina pectoris: Secondary | ICD-10-CM | POA: Diagnosis not present

## 2024-10-18 DIAGNOSIS — K7469 Other cirrhosis of liver: Secondary | ICD-10-CM

## 2024-10-18 DIAGNOSIS — I5023 Acute on chronic systolic (congestive) heart failure: Secondary | ICD-10-CM | POA: Diagnosis not present

## 2024-10-18 DIAGNOSIS — K920 Hematemesis: Secondary | ICD-10-CM | POA: Diagnosis not present

## 2024-10-18 DIAGNOSIS — N186 End stage renal disease: Secondary | ICD-10-CM | POA: Diagnosis not present

## 2024-10-18 DIAGNOSIS — D62 Acute posthemorrhagic anemia: Secondary | ICD-10-CM | POA: Diagnosis not present

## 2024-10-18 LAB — COMPREHENSIVE METABOLIC PANEL WITH GFR
ALT: 7 U/L (ref 0–44)
AST: 18 U/L (ref 15–41)
Albumin: 2.9 g/dL — ABNORMAL LOW (ref 3.5–5.0)
Alkaline Phosphatase: 117 U/L (ref 38–126)
Anion gap: 17 — ABNORMAL HIGH (ref 5–15)
BUN: 66 mg/dL — ABNORMAL HIGH (ref 8–23)
CO2: 20 mmol/L — ABNORMAL LOW (ref 22–32)
Calcium: 8.8 mg/dL — ABNORMAL LOW (ref 8.9–10.3)
Chloride: 96 mmol/L — ABNORMAL LOW (ref 98–111)
Creatinine, Ser: 8.16 mg/dL — ABNORMAL HIGH (ref 0.61–1.24)
GFR, Estimated: 7 mL/min — ABNORMAL LOW
Glucose, Bld: 83 mg/dL (ref 70–99)
Potassium: 4.8 mmol/L (ref 3.5–5.1)
Sodium: 132 mmol/L — ABNORMAL LOW (ref 135–145)
Total Bilirubin: 0.7 mg/dL (ref 0.0–1.2)
Total Protein: 7.1 g/dL (ref 6.5–8.1)

## 2024-10-18 LAB — ALBUMIN, FLUID (OTHER)
Albumin, Body Fluid Other: 1.8 g/dL
Source of Sample: 315780

## 2024-10-18 LAB — PATHOLOGIST SMEAR REVIEW

## 2024-10-18 LAB — CBC
HCT: 27.6 % — ABNORMAL LOW (ref 39.0–52.0)
Hemoglobin: 9.2 g/dL — ABNORMAL LOW (ref 13.0–17.0)
MCH: 30.1 pg (ref 26.0–34.0)
MCHC: 33.3 g/dL (ref 30.0–36.0)
MCV: 90.2 fL (ref 80.0–100.0)
Platelets: 158 K/uL (ref 150–400)
RBC: 3.06 MIL/uL — ABNORMAL LOW (ref 4.22–5.81)
RDW: 17.2 % — ABNORMAL HIGH (ref 11.5–15.5)
WBC: 13.4 K/uL — ABNORMAL HIGH (ref 4.0–10.5)
nRBC: 0 % (ref 0.0–0.2)

## 2024-10-18 LAB — RENAL FUNCTION PANEL
Albumin: 2.8 g/dL — ABNORMAL LOW (ref 3.5–5.0)
Anion gap: 17 — ABNORMAL HIGH (ref 5–15)
BUN: 70 mg/dL — ABNORMAL HIGH (ref 8–23)
CO2: 20 mmol/L — ABNORMAL LOW (ref 22–32)
Calcium: 8.6 mg/dL — ABNORMAL LOW (ref 8.9–10.3)
Chloride: 96 mmol/L — ABNORMAL LOW (ref 98–111)
Creatinine, Ser: 8.39 mg/dL — ABNORMAL HIGH (ref 0.61–1.24)
GFR, Estimated: 7 mL/min — ABNORMAL LOW
Glucose, Bld: 66 mg/dL — ABNORMAL LOW (ref 70–99)
Phosphorus: 4.8 mg/dL — ABNORMAL HIGH (ref 2.5–4.6)
Potassium: 5.6 mmol/L — ABNORMAL HIGH (ref 3.5–5.1)
Sodium: 133 mmol/L — ABNORMAL LOW (ref 135–145)

## 2024-10-18 LAB — PROTEIN, BODY FLUID (OTHER)
Source of Sample: 19588
Total Protein, Body Fluid Other: 3.9 g/dL

## 2024-10-18 LAB — HEMOGLOBIN AND HEMATOCRIT, BLOOD
HCT: 25.3 % — ABNORMAL LOW (ref 39.0–52.0)
Hemoglobin: 8.3 g/dL — ABNORMAL LOW (ref 13.0–17.0)

## 2024-10-18 LAB — TYPE AND SCREEN
ABO/RH(D): O POS
Antibody Screen: NEGATIVE

## 2024-10-18 MED ORDER — OCTREOTIDE LOAD VIA INFUSION
100.0000 ug | INTRAVENOUS | Status: AC
  Administered 2024-10-18: 100 ug via INTRAVENOUS
  Filled 2024-10-18: qty 50

## 2024-10-18 MED ORDER — HEPARIN SODIUM (PORCINE) 1000 UNIT/ML IJ SOLN
3200.0000 [IU] | Freq: Once | INTRAMUSCULAR | Status: AC
  Administered 2024-10-18: 3200 [IU]

## 2024-10-18 MED ORDER — PANTOPRAZOLE SODIUM 40 MG IV SOLR
80.0000 mg | INTRAVENOUS | Status: AC
  Administered 2024-10-18: 80 mg via INTRAVENOUS
  Filled 2024-10-18: qty 20

## 2024-10-18 MED ORDER — SODIUM CHLORIDE 0.9 % IV SOLN
50.0000 ug/h | INTRAVENOUS | Status: DC
  Administered 2024-10-18 – 2024-10-19 (×3): 50 ug/h via INTRAVENOUS
  Filled 2024-10-18 (×4): qty 1

## 2024-10-18 MED ORDER — ASPIRIN 81 MG PO TBEC: 81.0000 mg | DELAYED_RELEASE_TABLET | Freq: Every day | ORAL | Status: DC

## 2024-10-18 MED ORDER — IOHEXOL 350 MG/ML SOLN
75.0000 mL | Freq: Once | INTRAVENOUS | Status: AC | PRN
  Administered 2024-10-18: 75 mL via INTRAVENOUS

## 2024-10-18 MED ORDER — PANTOPRAZOLE SODIUM 40 MG IV SOLR
40.0000 mg | Freq: Two times a day (BID) | INTRAVENOUS | Status: DC
  Administered 2024-10-18 – 2024-10-19 (×2): 40 mg via INTRAVENOUS
  Filled 2024-10-18 (×2): qty 10

## 2024-10-18 MED ORDER — HEPARIN SODIUM (PORCINE) 1000 UNIT/ML IJ SOLN
INTRAMUSCULAR | Status: AC
  Filled 2024-10-18: qty 4

## 2024-10-18 MED ORDER — CARVEDILOL 3.125 MG PO TABS
3.1250 mg | ORAL_TABLET | Freq: Two times a day (BID) | ORAL | Status: DC
  Administered 2024-10-18 – 2024-10-23 (×9): 3.125 mg via ORAL
  Filled 2024-10-18 (×9): qty 1

## 2024-10-18 MED ORDER — SODIUM CHLORIDE 0.9 % IV SOLN
2.0000 g | INTRAVENOUS | Status: DC
  Administered 2024-10-18 – 2024-10-23 (×6): 2 g via INTRAVENOUS
  Filled 2024-10-18 (×6): qty 20

## 2024-10-18 NOTE — H&P (View-Only) (Signed)
 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 Admitted with hematemesis.  Still need to consider esophageal variceal bleed.  Rule out PUD.  Erosive disease.  No evidence of hepatic encephalopathy  Patient is hemodynamically stable.  Hemoglobin stable.  MELD 3.0: 27 at 11/28/2023  8:27 AM MELD-Na: 29 at 11/28/2023  8:27 AM  10/17/24>> 1.9 L para. No SBP by cell count   Continue twice daily IV pantoprazole  Continue empiric octreotide  infusion Continue empiric Rocephin  Patient will need EGD.  Will plan for tomorrow since patient has not had any further episodes of hematemesis , hemoglobin is stable and there are plans for dialysis today .  The risks and benefits of EGD with possible biopsies were discussed with the patient who agrees to proceed.  Await final ascitic fluid culture from 10/17/2024 Patient can eat today from GI standpoint, needs 2 g sodium restricted diet.  Abnormal small bowel on imaging KUB today showing dilated bowel loop in right mid abdomen.  Need to evaluate for obstructive process that led to nausea / vomiting. Primary team ordered CT AP  Multivessel CAD Acute on chronic systolic heart failure Heart Failure Team and Cardiology have been following   AFib Eliquis  has been on hold for cardiac evaluation. Heparin  currently on hold for GI bleed  Chronic anemia  His hemoglobin is stable at 9.2  End-stage renal disease on HD  Diabetes  Hypertension  History colon polyps Small tubular adenoma February 2023  History of H. pylori gastritis February 2023 ( Dr.  Brooklyn)  See PMH for any additional medical history  / medical problems  Principal Problem:   Acute on chronic systolic heart failure (HCC) Active Problems:   End-stage renal disease on hemodialysis (HCC)   GERD (gastroesophageal reflux disease)   Paroxysmal atrial fibrillation (HCC)   CAD (coronary artery disease)   History of anemia due to chronic kidney disease   HPI   Brief History:  Patient presented to [facility] 12/30 for elective LHC and RHC cath,  found to have severe multvessel disease.  He was later transferred to [facility] for treatment options .  Yesterday, he reportedly had 4 episodes of hematemesis (dark red blood).  He was taking Eliquis  but was taken off of it several days ago in preparation for the heart cath.  Patient denies any prior history of gastrointestinal bleeding. Denies NSAID use. He doesn't know if he takes any GI medications at home or if he takes any diuretics at home.  He reports having blood in stool this am but cannot describe characteristics of the blood. Has abdominal discomfort at paracentesis site.   +++++++++++++++++++++++  Last endoscopic evaluation May 2025 EGD Dr. Unk - varices screening - Normal duodenal bulb and second portion of the duodenum. - Erythematous mucosa in the gastric body and antrum. Biopsied. - Medium-sized hiatal hernia. - Granular mucosa in the esophagus. Biopsied. - Normal esophagus.  Recent Labs    10/15/24 0954 10/16/24 0446 10/18/24 0700  PROT  --   --  7.1  ALBUMIN  3.2* 2.9* 2.9*  AST  --   --  18  ALT  --   --  7  ALKPHOS  --   --  117  BILITOT  --   --  0.7   Recent Labs    10/16/24 0446 10/17/24 0517 10/18/24 0432  WBC 8.1 10.0 13.4*  HGB 8.8* 8.9* 9.2*  HCT 27.1* 27.5* 27.6*  MCV 91.9 92.9 90.2  PLT 168 167 158   Recent Labs    10/15/24 0954 10/16/24 0446 10/18/24 0700  NA 135 134* 132*  K 4.0 3.8 4.8  CL 97* 98 96*  CO2 22 25 20*  GLUCOSE 100* 100* 83   BUN 29* 38* 66*  CREATININE 4.38* 5.37* 8.16*  CALCIUM  9.1 8.7* 8.8*     DG Abd 1 View EXAM: 1 VIEW XRAY OF THE ABDOMEN 10/18/2024 06:38:33 AM  COMPARISON: CT abdomen and pelvis 01/21/2024.  CLINICAL HISTORY: Intractable abdominal pain.  FINDINGS:  BOWEL: Nonobstructive bowel gas pattern. There is a single mildly dilated small bowel segment in the right mid abdomen measuring 3.5 cm. There is colonic gas through at least to the mid-descending segment.  SOFT TISSUES: Vascular calcifications are noted in the pelvis. The atrophic renal shadows are not well seen.  BONES: No acute fracture. There is degenerative change of the lumbar spine and hips.  There is no supine evidence of free air.  IMPRESSION: 1. Single mildly dilated small bowel segment in the right mid abdomen, which may reflect focal ileus or early small bowel obstruction. 2. No supine evidence of free air.  Electronically signed by: Francis Quam MD 10/18/2024 07:00 AM EST RP Workstation: [id number]    Review of Systems: All systems reviewed and negative except where noted in HPI.  Physical Exam: Vital signs in last 24 hours: Temp:  [99.4 F (37.4 C)-100.4 F (38 C)] 99.4 F (37.4 C) (01/06 0904) Pulse Rate:  [97-109] 105 (01/06 0904) Resp:  [16-26] 22 (01/06 0904) BP: (117-145)/(73-86) 145/86 (01/06 0904) SpO2:  [94 %-97 %] 94 % (01/06 0440)   General:   Thinale in NAD Psych:  Cooperative. Flat affect Eyes: Pupils equal Ears:  Normal auditory acuity Nose: No deformity, discharge or lesions Neck:  Supple, no masses felt Lungs:  Clear to auscultation.  Heart:  Regular rate, regular rhythm.  Abdomen:  Soft, moderately to largely distended, moderate R tenderness,  active bowel sounds, no masses felt Rectal :  Deferred Msk: Symmetrical without gross deformities.  Neurologic:  Alert, oriented, grossly normal neurologically Extremities : No edema Skin:  Intact without significant lesions.    OUTPATIENT MEDICATIONS Prior to Admission medications  Medication Sig Start Date End Date Taking? Authorizing Provider  [Paused] apixaban  (ELIQUIS ) 5 MG TABS tablet Take 1 tablet (5 mg total) by mouth 2 (two) times daily. Wait to take this until your doctor or other care provider tells you to start again. 08/16/24  Yes Gollan, Timothy J, MD  calcium  acetate (PHOSLO ) 667 MG capsule Take 2 capsules (1,334 mg total) by mouth 3 (three) times a week. 10/14/24  Yes Lenon Marien CROME, MD  hydrALAZINE  (APRESOLINE ) 25 MG tablet Take 25 mg by mouth 3 (three) times daily.   Yes [provider]  hydrOXYzine  (ATARAX ) 25 MG tablet Take 25 mg by mouth 2 (two) times daily. 09/22/24  Yes [provider]  Multiple Vitamin (MULTIVITAMIN ADULT PO) Take 1 tablet by mouth daily.   Yes [provider]  omeprazole  (PRILOSEC) 20 MG capsule Take 20 mg by mouth every morning. 07/02/24  Yes [provider]  tamsulosin  (FLOMAX ) 0.4 MG CAPS capsule Take 0.4 mg by mouth daily. 09/19/24  Yes [provider]  albumin  human 25 % bottle Inject into the vein. Patient not taking: Reported on 10/14/2024    [provider]  amLODipine  (NORVASC ) 10 MG tablet Take 10 mg by mouth daily. Patient not taking: Reported on 10/14/2024    [provider]  atorvastatin  (LIPITOR) 40 MG tablet Take 1 tablet (40 mg total) by mouth daily. 10/13/24   Lenon Marien CROME, MD  epoetin  alfa-epbx (RETACRIT ) 10000 UNIT/ML injection Inject 1 mL (10,000 Units total) into the vein every Monday, Wednesday, and Friday at 6 PM. Patient not taking: Reported on 10/14/2024 10/12/24   Lenon Marien CROME, MD  feeding supplement (ENSURE PLUS HIGH PROTEIN) LIQD Take 237 mLs by mouth 2 (two) times daily between meals. 10/12/24   Lenon Marien CROME, MD  heparin  25000 UT/250ML infusion Inject 1,350 Units/hr into the vein continuous. 10/12/24   Lenon Marien CROME, MD  irbesartan  (AVAPRO ) 300 MG tablet Take 300 mg  by mouth daily. Patient not taking: Reported on 10/14/2024    [provider]  melatonin 5 MG TABS Take 5 mg by mouth at bedtime as needed (sleep). Patient not taking: Reported on 10/14/2024    [provider]  metoprolol  tartrate (LOPRESSOR ) 25 MG tablet Take 1 tablet (25 mg total) by mouth 2 (two) times daily. Patient not taking: Reported on 10/14/2024 10/12/24   Lenon Marien CROME, MD  sacubitril -valsartan  (ENTRESTO ) 49-51 MG Take 1 tablet by mouth 2 (two) times daily. 10/13/24   Gerard Frederick, NP    Allergies as of 10/12/2024   (No Known Allergies)    INPATIENT MEDICATIONS Current Facility-Administered Medications  Medication Dose Route Frequency Provider Last Rate Last Admin   (feeding supplement) PROSource Plus liquid 30 mL  30 mL Oral BID BM Stovall, Kathryn R, PA-C   30 mL at 10/17/24 1323   acetaminophen  (TYLENOL ) tablet 650 mg  650 mg Oral Q6H PRN Howerter, Justin B, DO   650 mg at 10/17/24 1844   Or   acetaminophen  (TYLENOL ) suppository 650 mg  650 mg Rectal Q6H PRN Howerter, Justin B, DO       aspirin  EC tablet 81 mg  81 mg Oral Daily Sundil, Subrina, MD       atorvastatin  (LIPITOR) tablet 40 mg  40 mg Oral Daily Howerter, Justin B, DO   40 mg at 10/17/24 1041   cefTRIAXone  (ROCEPHIN ) 2 g in sodium chloride  0.9 % 100 mL IVPB  2 g Intravenous Q24H Sundil, Subrina, MD 200 mL/hr at 10/18/24 0708 2 g at 10/18/24 0708   Chlorhexidine  Gluconate Cloth 2 % PADS 6 each  6 each Topical Q0600 Jerrye Katheryn BROCKS, MD   6 each at 10/18/24 0600   ezetimibe  (ZETIA ) tablet 10 mg  10 mg Oral Daily Floretta Mallard, MD   10 mg at 10/17/24 1041   hydrALAZINE  (APRESOLINE ) tablet 50 mg  50 mg Oral Q8H McLean, Dalton S, MD   50 mg at 10/17/24 2008  isosorbide  mononitrate (IMDUR ) 24 hr tablet 30 mg  30 mg Oral Daily McLean, Dalton S, MD   30 mg at 10/17/24 1041   melatonin tablet 3 mg  3 mg Oral QHS PRN Howerter, Justin B, DO   3 mg at 10/13/24 2300   octreotide  (SANDOSTATIN ) 500 mcg in  sodium chloride  0.9 % 250 mL (2 mcg/mL) infusion  50 mcg/hr Intravenous Continuous Sundil, Subrina, MD 25 mL/hr at 10/18/24 0743 50 mcg/hr at 10/18/24 0743   ondansetron  (ZOFRAN ) injection 4 mg  4 mg Intravenous Q6H PRN Howerter, Justin B, DO       pantoprazole  (PROTONIX ) injection 40 mg  40 mg Intravenous Q12H Sundil, Subrina, MD       pneumococcal 20-valent conjugate vaccine (PREVNAR 20 ) injection 0.5 mL  0.5 mL Intramuscular Tomorrow-1000 Verdene Purchase, MD       sacubitril -valsartan  (ENTRESTO ) 49-51 mg per tablet  1 tablet Oral BID Howerter, Justin B, DO   1 tablet at 10/17/24 2009     Past Medical History:  Diagnosis Date   AKI (acute kidney injury) 08/30/2018   Anemia of chronic disease    Chronic systolic heart failure (HCC)    Diabetes mellitus without complication (HCC)    no meds since weight loss   Dialysis patient    Mon, Wed, Fri   ESRD (end stage renal disease) (HCC)    on HD (M,W,F)   Hyperkalemia 02/04/2021   Hypertension    Metabolic acidosis, increased anion gap 02/04/2021   Paroxysmal atrial fibrillation (HCC)    Renal disorder    Sepsis without acute organ dysfunction (HCC) 11/30/2023    Past Surgical History:  Procedure Laterality Date   CATARACT EXTRACTION W/PHACO Right 10/08/2023   Procedure: CATARACT EXTRACTION PHACO AND INTRAOCULAR LENS PLACEMENT (IOC) RIGHT DIABETIC 12.19 01:08.8;  Surgeon: Enola Feliciano Hugger, MD;  Location: [facility];  Service: Ophthalmology;  Laterality: Right;   CATARACT EXTRACTION W/PHACO Left 10/22/2023   Procedure: CATARACT EXTRACTION PHACO AND INTRAOCULAR LENS PLACEMENT (IOC) LEFT DIABETIC MALYUGIN;  Surgeon: Enola Feliciano Hugger, MD;  Location: [facility];  Service: Ophthalmology;  Laterality: Left;  7.66 0:54.2   COLONOSCOPY WITH PROPOFOL  N/A 11/21/2021   Procedure: COLONOSCOPY WITH PROPOFOL ;  Surgeon: Unk Corinn Skiff, MD;  Location: [facility] ENDOSCOPY;  Service: Gastroenterology;  Laterality: N/A;    DIALYSIS/PERMA CATHETER INSERTION Right 05/20/2024   Procedure: DIALYSIS/PERMA CATHETER INSERTION;  Surgeon: Marea Selinda RAMAN, MD;  Location: [facility] INVASIVE CV LAB;  Service: Cardiovascular;  Laterality: Right;   DIALYSIS/PERMA CATHETER REPAIR N/A 12/15/2023   Procedure: DIALYSIS/PERMA CATHETER REPAIR;  Surgeon: Jama Cordella MATSU, MD;  Location: [facility] INVASIVE CV LAB;  Service: Cardiovascular;  Laterality: N/A;   ESOPHAGOGASTRODUODENOSCOPY N/A 03/03/2024   Procedure: EGD (ESOPHAGOGASTRODUODENOSCOPY);  Surgeon: Unk Corinn Skiff, MD;  Location: [facility] ENDOSCOPY;  Service: Gastroenterology;  Laterality: N/A;   ESOPHAGOGASTRODUODENOSCOPY (EGD) WITH PROPOFOL  N/A 11/21/2021   Procedure: ESOPHAGOGASTRODUODENOSCOPY (EGD) WITH PROPOFOL ;  Surgeon: Unk Corinn Skiff, MD;  Location: [facility] ENDOSCOPY;  Service: Gastroenterology;  Laterality: N/A;   INSERTION OF DIALYSIS CATHETER     IR PARACENTESIS  10/17/2024   LEG SURGERY  1987   RIGHT/LEFT HEART CATH AND CORONARY ANGIOGRAPHY N/A 10/11/2024   Procedure: RIGHT/LEFT HEART CATH AND CORONARY ANGIOGRAPHY;  Surgeon: Mady Bruckner, MD;  Location: [facility] INVASIVE CV LAB;  Service: Cardiovascular;  Laterality: N/A;    Family History  Problem Relation Age of Onset   Diabetes Mellitus II Sister    Diabetes Mellitus II Paternal Grandmother  Social History   Socioeconomic History   Marital status: Single    Spouse name: Not on file   Number of children: Not on file   Years of education: Not on file   Highest education level: Not on file  Occupational History   Not on file  Tobacco Use   Smoking status: Former    Types: Cigarettes   Smokeless tobacco: Never  Vaping Use   Vaping status: Never Used  Substance and Sexual Activity   Alcohol use: Yes    Alcohol/week: 7.0 standard drinks of alcohol    Types: 7 Cans of beer per week    Comment: no drinking 10/2023   Drug use: Not Currently    Types: Cocaine , Marijuana    Comment: 09/24/23 - Marijuana 3x/week. No  cocaine  since Friday 08/27/2018   Sexual activity: Not on file  Other Topics Concern   Not on file  Social History Narrative   Not on file   Social Drivers of Health   Tobacco Use: Medium Risk (10/13/2024)   Patient History    Smoking Tobacco Use: Former    Smokeless Tobacco Use: Never    Passive Exposure: Not on file  Financial Resource Strain: Not on file  Food Insecurity: Patient Declined (10/14/2024)   Epic    Worried About Programme Researcher, Broadcasting/film/video in the Last Year: Patient declined    Barista in the Last Year: Patient declined  Transportation Needs: No Transportation Needs (10/14/2024)   Epic    Lack of Transportation (Medical): No    Lack of Transportation (Non-Medical): No  Physical Activity: Not on file  Stress: Not on file  Social Connections: Not on file  Intimate Partner Violence: Not At Risk (10/14/2024)   Epic    Fear of Current or Ex-Partner: No    Emotionally Abused: No    Physically Abused: No    Sexually Abused: No  Depression (PHQ2-9): Not on file  Alcohol Screen: Not on file  Housing: Low Risk (10/14/2024)   Epic    Unable to Pay for Housing in the Last Year: No    Number of Times Moved in the Last Year: 0    Homeless in the Last Year: No  Utilities: Not At Risk (10/14/2024)   Epic    Threatened with loss of utilities: No  Health Literacy: Not on file    Code Status   Code Status: Full Code   Vina Dasen, NP-C   10/18/2024, 9:20 AM  -------------------------------------------------------------------------------------------  I have taken a history, reviewed the chart and examined the patient. I performed a substantive portion of this encounter, including complete performance of at least one of the key components, in conjunction with the APP. I agree with the APP's note, impression and recommendations  64 year old male with history of end-stage renal disease on dialysis, decompensated HCV cirrhosis with history of ascites, transferred from [place]  after being found to have severe coronary artery disease to be assessed for revascularization options.  Patient subsequently found to be not a candidate for any revascularization options and medical treatment was recommended for his severe coronary artery disease. GI consulted overnight after the patient experienced multiple episodes of hematemesis.  On my discussion with the patient, he states that he was having bleeding from his nose earlier that day.  Earlier this morning he had the urge to have a bowel movement and got nauseated and had multiple episodes of bright red hematemesis with mucus.  He states that this was  not large-volume blood.  He did not experience any weakness, dizziness or lightheadedness.  He denied having any overtly bloody stools to me.  His hemoglobin has remained stable and he has remained hemodynamically stable as well.  He had an EGD earlier this year that did not show any esophageal varices. Overall, my concern that he is having a major upper GI bleed from esophageal varices is quite low.  More suspicious of vomiting blood from nasopharyngeal source, but do agree that an upper endoscopy is warranted.  I do not think an emergent endoscopy is necessary today, and believe that we have time to medically optimize him and allow him to get some fluid off at dialysis.    It was explained to the patient that he is at quite high risk from a sedation standpoint given his severe heart disease as well as chronic kidney and liver disease.  The patient also has been complaining of abdominal pain since his paracentesis, and is noted to have a persistent anterior complex fluid collection, which has been noted since February of this year.  The patient has very tender induration corresponding to this area.  He tells me that this tenderness and firmness in his abdomen only developed in the last day or so, and has not been persistent over the months, despite the persistent CT abnormalities.  Suspect  this may be a hematoma.  Ascitic fluid analysis not suggestive of infection.  The patient has been started on empiric antibiotics given his bleeding.  I think this should be adequate for any infectious concerns.  I agree no indication for surgery consultation at this time.  Please continue medical treatment for possible upper GI bleed in cirrhotic with twice daily PPI, octreotide  drip and empiric ceftriaxone  Okay for clear liquid diet today if he does not continue to have hematemesis. Plan for EGD tomorrow. Would continue to hold heparin /Eliquis  pending EGD findings  Scout Guyett E. Stacia, MD [facility]  High complex medical decision making based on the patient's complex medical history and high risk for procedural intervention (this includes chart review, review of results, face-to-face time used for counseling as well as treatment plan and follow-up. The patient was provided an opportunity to ask questions and all were answered. The patient agreed with the plan and demonstrated an understanding of the instructions    "

## 2024-10-18 NOTE — Plan of Care (Addendum)
 Hematemesis Multivessel CAD History of A-fib Hepatic cirrhosis History of GERD History of anemia chronic disease. RN informed that patient reported vomited blood.  Patient also has low-grade fever otherwise hemodynamically stable.  Patient has been admitted for multivessel CAD underwent heart cath 12/30 transferred to [facility] for cardiothoracic  evaluation.  Patient not a surgical candidate due to multiple comorbidities and currently on medical management. Given patient has multivessel CAD continuing aspirin  and holding Eliquis  as of now in the setting of GI bleed.  Initially patient being treated with IV heparin  and later on has been transition to Eliquis .  Giving  IV Protonix  80 mg followed by starting IV Protonix  40 mg twice daily.  Pending CBC.  Obtain type and screen.  If hemoglobin below 8 will transfuse. Per chart review EGD in 02/2024 showed erythematous mucosa of the gastric body. History of hepatic cirrhosis.  Concern for possible underlying portal hypertensive gastropathy or variceal bleeding.   As patient has history of hepatic cirrhosis and in the setting of hematemesis with low-grade fever starting IV ceftriaxone  and obtaining blood cultures as well.  Consulted [facility] Dr. Wilhelmenia to evaluate patient in the AM.  Update, CBC showing stable hemoglobin 9.2. Spoke with gastroenterology recommended to start octreotide  drip and add ceftriaxone .  GI will evaluate patient soon for possible EGD today.  Agrees with to hold Eliquis  and continue aspirin  as of now.  Casey Seier, MD [facility] 10/18/2024, 5:07 AM

## 2024-10-18 NOTE — Progress Notes (Signed)
 " [facility] Progress Note   Subjective:  Seen on HD - 2.5L UFG and tolerating. S/p episode of vomiting blood overnight, also passed dark stool yesterday - GI consulted, for EGD tomorrow. On PPI and octeotride. S/p abd CT showing possible abd hematoma.  Objective Vitals:   10/18/24 0904 10/18/24 1152 10/18/24 1206 10/18/24 1230  BP: (!) 145/86 134/80 135/85 139/87  Pulse: (!) 105     Resp: (!) 22     Temp: 99.4 F (37.4 C) 99.9 F (37.7 C)    TempSrc: Oral Oral    SpO2:      Weight:  66 kg    Height:       Physical Exam General: Well appearing, NAD. Room air Heart: RRR Lungs: CTA upper lobes, bibasilar rales Abdomen: soft, less distended Extremities: no LE edema [facility]  Additional Objective Labs: Basic Metabolic Panel: Recent Labs  Lab 10/14/24 1044 10/15/24 0954 10/16/24 0446 10/18/24 0700  NA 134* 135 134* 132*  K 3.6 4.0 3.8 4.8  CL 98 97* 98 96*  CO2 22 22 25  20*  GLUCOSE 123* 100* 100* 83  BUN 47* 29* 38* 66*  CREATININE 5.62* 4.38* 5.37* 8.16*  CALCIUM  8.9 9.1 8.7* 8.8*  PHOS 4.1 3.4 3.9  --    Liver Function Tests: Recent Labs  Lab 10/11/24 1536 10/14/24 1044 10/15/24 0954 10/16/24 0446 10/18/24 0700  AST 22 19  --   --  18  ALT 7 8  --   --  7  ALKPHOS 151* 141*  --   --  117  BILITOT 0.6 0.6  --   --  0.7  PROT 8.1 7.7  --   --  7.1  ALBUMIN  3.4* 3.3* 3.2* 2.9* 2.9*   CBC: Recent Labs  Lab 10/14/24 1044 10/14/24 1933 10/15/24 0954 10/16/24 0446 10/17/24 0517 10/18/24 0432  WBC 6.9 8.2 7.7 8.1 10.0 13.4*  NEUTROABS 5.0  --   --   --   --   --   HGB 9.3* 10.0* 10.6* 8.8* 8.9* 9.2*  HCT 28.4* 30.2* 32.5* 27.1* 27.5* 27.6*  MCV 90.7 90.7 92.3 91.9 92.9 90.2  PLT 172 178 201 168 167 158   Studies/Results: CT ABDOMEN PELVIS W CONTRAST Addendum Date: 10/18/2024 ADDENDUM REPORT: 10/18/2024 10:36 ADDENDUM: These results were called by telephone at the time of interpretation on 10/18/2024 at 10:34 a.m. to  provider [facility] , who verbally acknowledged these results. Electronically Signed   By: Toribio Agreste M.D.   On: 10/18/2024 10:36   Result Date: 10/18/2024 CLINICAL DATA:  Abdominal pain and ascites. Possible small bowel obstruction. Recent paracentesis 10/17/2024. EXAM: CT ABDOMEN AND PELVIS WITH CONTRAST TECHNIQUE: Multidetector CT imaging of the abdomen and pelvis was performed using the standard protocol following bolus administration of intravenous contrast. RADIATION DOSE REDUCTION: This exam was performed according to the departmental dose-optimization program which includes automated exposure control, adjustment of the mA and/or kV according to patient size and/or use of iterative reconstruction technique. CONTRAST:  75mL OMNIPAQUE  IOHEXOL  350 MG/ML SOLN COMPARISON:  01/21/2024, 11/26/2023 FINDINGS: Lower chest: Mild stable cardiomegaly. Calcified plaque over the 3 vessel coronary arteries. Mild calcification over the mitral valve annulus. Moderate right pleural effusion with bibasilar dependent atelectatic change. Hepatobiliary: Mild diffuse low-attenuation of the liver with mild nodular contour compatible with known cirrhosis. No focal liver mass. Gallbladder and biliary tree are normal. Pancreas: Normal. Spleen: Normal. Adrenals/Urinary Tract: Adrenal glands are normal. Stable atrophic kidneys with minimal cystic  change. No hydronephrosis or nephrolithiasis. Bladder is contracted with mild ill definition of the wall. Ureters are normal. Stomach/Bowel: Stomach and small bowel are unremarkable. No dilated small bowel loops. Diverticulosis of the colon most prominent over the descending and sigmoid colon. Subtle wall thickening throughout much of the colon which could be seen due to mild acute colitis although may simply be related to patient's cirrhosis and hypoalbuminemia. Appendix is normal. Vascular/Lymphatic: Mild calcified plaque over the abdominal aorta which is normal in caliber. Remaining  vascular structures are unremarkable. No adenopathy. Reproductive: Prostate is unremarkable. Other: Mild-to-moderate ascites present without significant change from the prior exams. Note that the previous exam from 11/26/2023 demonstrate a large fluid and air collection throughout the abdomen/pelvis measuring approximately 28 cm in diameter which decreased in size on follow-up CT 01/21/2024 measuring 15 cm at which time it appeared to represent simple fluid. The current exam demonstrates a large mixed density collection predominately centered over the anterior abdominal wall in the midline extending both above and below the level of the umbilicus. This measures approximately 7.6 x 14.3 cm in AP and transverse dimension and proximally 12.8 cm in craniocaudal dimension. Note that the may be a component of this collection extending into the peritoneum in the region of the previously seen focal walled-off fluid collection. These findings likely represent hemorrhagic debris/hematoma possibly from recent intervention. No acute vascular extravasation identified. Mild diffuse subcutaneous edema. Musculoskeletal: Screws noted. The spine unchanged. Minimal loss of mid prevertebral body height at the Schmorl's nodes involving T10 and T11 unchanged. IMPRESSION: 1. Large mixed density collection predominately centered over the anterior abdominal wall in the midline extending both above and below the level of the umbilicus measuring approximately 7.6 x 14.3 x 12.8 cm. Note that there may be a component of this collection extending into the peritoneum in the region of the previously seen focal walled-off fluid collection. These findings likely represent hemorrhagic debris/hematoma from recent intervention, although could represent progression with complication of the previously seen fluid collection. No acute vascular extravasation identified. 2. Mild-to-moderate ascites without significant change from the prior exams. 3. Evidence  of patient's known cirrhosis. 4. Colonic diverticulosis. Subtle wall thickening throughout much of the colon which could be seen due to mild acute colitis, although may simply be related to patient's cirrhosis and hypoalbuminemia. 5. Moderate right pleural effusion with bibasilar dependent atelectatic change. 6. Aortic atherosclerosis. Atherosclerotic coronary artery disease. Aortic Atherosclerosis (ICD10-I70.0). Electronically Signed: By: Toribio Agreste M.D. On: 10/18/2024 10:27   DG Abd 1 View Result Date: 10/18/2024 EXAM: 1 VIEW XRAY OF THE ABDOMEN 10/18/2024 06:38:33 AM COMPARISON: CT abdomen and pelvis 01/21/2024. CLINICAL HISTORY: Intractable abdominal pain. FINDINGS: BOWEL: Nonobstructive bowel gas pattern. There is a single mildly dilated small bowel segment in the right mid abdomen measuring 3.5 cm. There is colonic gas through at least to the mid-descending segment. SOFT TISSUES: Vascular calcifications are noted in the pelvis. The atrophic renal shadows are not well seen. BONES: No acute fracture. There is degenerative change of the lumbar spine and hips. There is no supine evidence of free air. IMPRESSION: 1. Single mildly dilated small bowel segment in the right mid abdomen, which may reflect focal ileus or early small bowel obstruction. 2. No supine evidence of free air. Electronically signed by: Francis Quam MD 10/18/2024 07:00 AM EST RP Workstation: [id number]   IR Paracentesis Result Date: 10/17/2024 INDICATION: 64 year old male. History HCV and end-stage renal disease with recurrent ascites. Request is for therapeutic and diagnostic paracentesis. EXAM:  ULTRASOUND GUIDED THERAPEUTIC AND DIAGNOSTIC PARACENTESIS MEDICATIONS: Lidocaine  1% 10 mL COMPLICATIONS: None immediate. PROCEDURE: Informed written consent was obtained from the patient after a discussion of the risks, benefits and alternatives to treatment. A timeout was performed prior to the initiation of the procedure. Initial ultrasound  scanning demonstrates a small amount of ascites within the left lower abdominal quadrant. The left lower abdomen was prepped and draped in the usual sterile fashion. 1% lidocaine  was used for local anesthesia. Following this, a 19 gauge, 7-cm, Yueh catheter was introduced. An ultrasound image was saved for documentation purposes. The paracentesis was performed. The catheter was removed and a dressing was applied. The patient tolerated the procedure well without immediate post procedural complication. FINDINGS: A total of approximately 1.9 L of straw-colored fluid was removed. Samples were sent to the laboratory as requested by the clinical team. IMPRESSION: Successful ultrasound-guided therapeutic and diagnostic paracentesis yielding 1.9 L of peritoneal fluid. Performed by Delon Beagle NP PLAN: If the patient eventually requires >/=2 paracenteses in a 30 day period, candidacy for formal evaluation by the [facility] Interventional Radiology Portal Hypertension Clinic will be assessed. Thom Hall, MD [facility] Radiology Electronically Signed   By: Thom Hall M.D.   On: 10/17/2024 10:22   Medications:  cefTRIAXone  (ROCEPHIN )  IV 2 g (10/18/24 0708)   octreotide  (SANDOSTATIN ) 500 mcg in sodium chloride  0.9 % 250 mL (2 mcg/mL) infusion 50 mcg/hr (10/18/24 0743)    (feeding supplement) PROSource Plus  30 mL Oral BID BM   atorvastatin   40 mg Oral Daily   carvedilol   3.125 mg Oral BID WC   Chlorhexidine  Gluconate Cloth  6 each Topical Q0600   ezetimibe   10 mg Oral Daily   hydrALAZINE   50 mg Oral Q8H   isosorbide  mononitrate  30 mg Oral Daily   pantoprazole  (PROTONIX ) IV  40 mg Intravenous Q12H   pneumococcal 20-valent conjugate vaccine  0.5 mL Intramuscular Tomorrow-1000   sacubitril -valsartan   1 tablet Oral BID   Dialysis Orders [facility] - MWF 3:15hr, 400/800, EDW 65 kg, TDC, 3K/2.5 calcium .  Heparin : 800 units bolus, 400 units  hourly.  Meds: Mircera 150 mcg every 2 weeks (last dose as an outpatient 12/29), Venofer 50 mg q. weekly   Assessment/Plan: CAD, severe multivessel disease: Not a candidate for CABG, on medical therapy. Biventricular HF: With volume on imaging/exam. Trying serial HD but with very long HD census in-house. On Entresto , hydral/iso. S/p 1.9L paracentesis 1/5. ESRD: Usual MWF schedule. Off schedule this week - s/p HD 1/4, 1/6 (today) - will move back to usual schedule as our census allows. HTN/volume: BP good,may have to scale back on something to allow UF - see how goes with HD. 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 Not due for ESA yet. See #6. Hematemesis: ?Variceal bleed. Known cirrhosis. GI consulted, for EGD tomorrow. On PPI + octeotride + empiric Ceftriaxone . Eliquis  on hold. Secondary HPTH: Ca/Phos ok - continue home meds Nutrition: Alb low, continue supps. Pt desiring heart-healthy diet rather than renal diet, ok for now - watch labs.   Izetta Boehringer, PA-C 10/18/2024, 12:45 PM  [facility]    "

## 2024-10-18 NOTE — Plan of Care (Signed)

## 2024-10-18 NOTE — Progress Notes (Addendum)
 "  [facility] PROGRESS NOTE   KODA ROUTON FMW:[id number] DOB: 07/16/1961 DOA: 10/13/2024  PCP: [facility]  Brief History: 64 y.o. male with a PMH significant for chronic HFrEF with LVEF 50% on most recent stress test, ESRD on HD MWF, HTN, HLD, PAF on Eliquis , hepatitis C and liver cirrhosis, presented with worsening of exertional dyspnea. Underwent stress test recently which showed a moderate size severe partially reversible defect involving the anterior wall apex and apical inferior segment consistent with scar and peri-infarct ischemia. LVEF 51%. Presented to [facility] for elective LHC and RHC. Cath found L main stenosis.  Patient was subsequently transferred to [facility] for cardiothoracic surgery input.  Consultants: Cardiology.  Cardiothoracic surgery.  Nephrology  Procedures: Cardiac catheterization.  Hemodialysis.  Paracentesis    Subjective/Interval History: Overnight events noted.  Patient mentioned that he also saw red blood in his stool early this morning.  Complains of abdominal pain.  Some nausea but no vomiting since earlier this morning.  Denies any chest pain or shortness of breath.      Assessment/Plan:  Hematemesis/abdominal pain Patient vomited blood overnight.  Also had blood in his stool.  Hemodynamics noted to be stable. Chart was reviewed.  It appears that patient underwent upper endoscopy in May 2025 which did not reveal any varices at that time. GI bleed occurred in the setting of anticoagulation which is currently on hold. Case was discussed with gastroenterology overnight. Patient started on a clear diet infusion.  Started on ceftriaxone . Remains n.p.o. Abdomen is noted to be quite tender this morning.  Abdominal x-ray raise concern for bowel obstruction.  Will proceed with CT scan of his abdomen pelvis this morning. Surprisingly hemoglobin did not drop much when checked earlier today.  Will recheck again this morning.  BUN  noted to be much higher this morning compared to yesterday. Of note CT scan done in February and April 2025 revealed significant ascites fluid collection.  Some air fluid levels were also noted.  Subsequent CT scan in April showed a loculated thick-walled collection in the anterior lower abdomen corresponding to the collection seen previously on the CT scan from February.  This is probably the mass palpated on examination today.   It appears that he was seen by Dr. Marinda with general surgery at that time who did not recommend any further interventions and told him to follow-up with his hepatologist at [facility]. Will see what today's CT scan shows.  ADDENDUM Called by radiologist that patient has again a very large fluid collection in his abdomen.  He now has a new component which appears to be in his abdominal wall.  Could be related to the paracentesis that was done yesterday while he was on heparin .  Could have had bleeding episodes though his hemoglobin was stable this morning.  No small bowel obstruction noted.  Will continue to follow this abnormality serially with examination and clinical status.  Patient with numerous comorbidities and not a candidate for any kind of surgical intervention at this time unless it becomes a life-threatening situation.  Since the abdominal collection has been present previously as well and has been evaluated by surgery previously, we will not pursue this further at this time unless patient starts having significant fevers with significantly elevated WBC or has positive blood cultures.  Low-grade fever Etiology unclear.  Leukocytosis noted this morning.  Patient started on ceftriaxone  overnight.  Follow-up on blood cultures.  Does not have any respiratory symptoms.  Coronary artery  disease with left main disease Cardiac catheterization on 12/30 showed severe multivessel disease including left main disease.  Patient was transferred to [facility] for cardiothoracic  input. Patient seen by cardiothoracic surgery.  Case was also discussed by cardiology team.  He is not a candidate for any surgeries or interventions.  Medical management to be pursued. Patient is on aspirin , Zetia , hydralazine  and isosorbide  mononitrate.  His metoprolol  was held due to acute CHF. Stable from cardiac standpoint.  IV heparin  was changed over to Eliquis  which is on hold due to GI bleed.  Acute on chronic biventricular CHF Echo done in October 2025 showed LVEF of 35 to 40% with severely reduced right ventricular systolic function as well.   Patient was started on Entresto  by cardiology.   Volume is being managed primarily with hemodialysis.  Patient was dialyzed on 1/2, 1/4.   Heart failure team is following.   Echocardiogram showed LVEF of 40 to 45%.  Low RV function was also noted.    End-stage renal disease on hemodialysis Usually dialyzed Monday Wednesday Friday. Nephrology is following.  Dialyzed on 1/2 and 1/4.  Possible dialysis later today.  Paroxysmal atrial fibrillation Stable.  Metoprolol  on hold due to acute CHF.  Should be able to resume at discharge.  Heart rate is stable.  Anticoagulation on hold due to GI bleed.  Was on Eliquis .    Anemia of chronic disease/acute blood loss anemia Hemoglobin has been close to his baseline.  No significant drop noted despite the hematemesis..  Will recheck later today.    History of liver cirrhosis/hepatitis C/Ascites Significant ascites noted on US .   Underwent paracentesis on 1/5.  No evidence of SBP.  See above regarding GI bleed and abdominal pain.    DVT Prophylaxis: Off of anticoagulation.  SCDs will be ordered. Code Status: Full code Family Communication: Discussed with patient Disposition Plan: Anticipate return home when improved   Medications: Scheduled:  (feeding supplement) PROSource Plus  30 mL Oral BID BM   aspirin  EC  81 mg Oral Daily   atorvastatin   40 mg Oral Daily   Chlorhexidine  Gluconate Cloth  6  each Topical Q0600   ezetimibe   10 mg Oral Daily   hydrALAZINE   50 mg Oral Q8H   isosorbide  mononitrate  30 mg Oral Daily   pantoprazole  (PROTONIX ) IV  40 mg Intravenous Q12H   pneumococcal 20-valent conjugate vaccine  0.5 mL Intramuscular Tomorrow-1000   sacubitril -valsartan   1 tablet Oral BID   Continuous:  cefTRIAXone  (ROCEPHIN )  IV 2 g (10/18/24 0708)   octreotide  (SANDOSTATIN ) 500 mcg in sodium chloride  0.9 % 250 mL (2 mcg/mL) infusion 50 mcg/hr (10/18/24 0743)   PRN:acetaminophen  **OR** acetaminophen , melatonin, ondansetron  (ZOFRAN ) IV    Objective:  Vital Signs  Vitals:   10/17/24 2001 10/17/24 2348 10/18/24 0440 10/18/24 0904  BP: 137/81 117/73 129/80 (!) 145/86  Pulse:  98 (!) 109 (!) 105  Resp: (!) 25 20 17  (!) 22  Temp: 99.5 F (37.5 C) (!) 100.4 F (38 C) 99.8 F (37.7 C) 99.4 F (37.4 C)  TempSrc: Axillary Oral Axillary Oral  SpO2: 97% 97% 94%   Weight:      Height:        Intake/Output Summary (Last 24 hours) at 10/18/2024 1008 Last data filed at 10/18/2024 0743 Gross per 24 hour  Intake 357.3 ml  Output --  Net 357.3 ml   Filed Weights   10/16/24 0937 10/16/24 1307 10/17/24 0440  Weight: 69.2 kg 64.5 kg  64.8 kg    General appearance: Awake alert.  In no distress Resp: Clear to auscultation bilaterally.  Normal effort Cardio: S1-S2 is normal regular.  No S3-S4.  No rubs murmurs or bruit GI: Abdomen remains distended though less so compared to yesterday.  However it is quite tender this morning.  Ill-defined mass noted in the lower abdomen.  Unclear if this is distended bowel.  Sluggish bowel sounds. Extremities: No edema.  Full range of motion of lower extremities. Neurologic: Alert and oriented x3.  No focal neurological deficits.    Lab Results:  Data Reviewed: I have personally reviewed following labs and reports of the imaging studies  CBC: Recent Labs  Lab 10/14/24 1044 10/14/24 1933 10/15/24 0954 10/16/24 0446 10/17/24 0517  10/18/24 0432  WBC 6.9 8.2 7.7 8.1 10.0 13.4*  NEUTROABS 5.0  --   --   --   --   --   HGB 9.3* 10.0* 10.6* 8.8* 8.9* 9.2*  HCT 28.4* 30.2* 32.5* 27.1* 27.5* 27.6*  MCV 90.7 90.7 92.3 91.9 92.9 90.2  PLT 172 178 201 168 167 158    Basic Metabolic Panel: Recent Labs  Lab 10/12/24 0230 10/12/24 1307 10/14/24 1044 10/15/24 0954 10/16/24 0446 10/18/24 0700  NA 137  --  134* 135 134* 132*  K 3.7  --  3.6 4.0 3.8 4.8  CL 101  --  98 97* 98 96*  CO2 23  --  22 22 25  20*  GLUCOSE 98  --  123* 100* 100* 83  BUN 46*  --  47* 29* 38* 66*  CREATININE 5.30*  --  5.62* 4.38* 5.37* 8.16*  CALCIUM  9.1  --  8.9 9.1 8.7* 8.8*  MG  --   --  1.9  --   --   --   PHOS  --  2.1* 4.1 3.4 3.9  --     GFR: Estimated Creatinine Clearance: 8.4 mL/min (A) (by C-G formula based on SCr of 8.16 mg/dL (H)).  Liver Function Tests: Recent Labs  Lab 10/11/24 1536 10/14/24 1044 10/15/24 0954 10/16/24 0446 10/18/24 0700  AST 22 19  --   --  18  ALT 7 8  --   --  7  ALKPHOS 151* 141*  --   --  117  BILITOT 0.6 0.6  --   --  0.7  PROT 8.1 7.7  --   --  7.1  ALBUMIN  3.4* 3.3* 3.2* 2.9* 2.9*    Radiology Studies: DG Abd 1 View Result Date: 10/18/2024 EXAM: 1 VIEW XRAY OF THE ABDOMEN 10/18/2024 06:38:33 AM COMPARISON: CT abdomen and pelvis 01/21/2024. CLINICAL HISTORY: Intractable abdominal pain. FINDINGS: BOWEL: Nonobstructive bowel gas pattern. There is a single mildly dilated small bowel segment in the right mid abdomen measuring 3.5 cm. There is colonic gas through at least to the mid-descending segment. SOFT TISSUES: Vascular calcifications are noted in the pelvis. The atrophic renal shadows are not well seen. BONES: No acute fracture. There is degenerative change of the lumbar spine and hips. There is no supine evidence of free air. IMPRESSION: 1. Single mildly dilated small bowel segment in the right mid abdomen, which may reflect focal ileus or early small bowel obstruction. 2. No supine evidence of  free air. Electronically signed by: Francis Quam MD 10/18/2024 07:00 AM EST RP Workstation: [id number]   IR Paracentesis Result Date: 10/17/2024 INDICATION: 64 year old male. History HCV and end-stage renal disease with recurrent ascites. Request is for therapeutic and diagnostic paracentesis. EXAM: ULTRASOUND  GUIDED THERAPEUTIC AND DIAGNOSTIC PARACENTESIS MEDICATIONS: Lidocaine  1% 10 mL COMPLICATIONS: None immediate. PROCEDURE: Informed written consent was obtained from the patient after a discussion of the risks, benefits and alternatives to treatment. A timeout was performed prior to the initiation of the procedure. Initial ultrasound scanning demonstrates a small amount of ascites within the left lower abdominal quadrant. The left lower abdomen was prepped and draped in the usual sterile fashion. 1% lidocaine  was used for local anesthesia. Following this, a 19 gauge, 7-cm, Yueh catheter was introduced. An ultrasound image was saved for documentation purposes. The paracentesis was performed. The catheter was removed and a dressing was applied. The patient tolerated the procedure well without immediate post procedural complication. FINDINGS: A total of approximately 1.9 L of straw-colored fluid was removed. Samples were sent to the laboratory as requested by the clinical team. IMPRESSION: Successful ultrasound-guided therapeutic and diagnostic paracentesis yielding 1.9 L of peritoneal fluid. Performed by Delon Beagle NP PLAN: If the patient eventually requires >/=2 paracenteses in a 30 day period, candidacy for formal evaluation by the [facility] Interventional Radiology Portal Hypertension Clinic will be assessed. Thom Hall, MD [facility] Radiology Electronically Signed   By: Thom Hall M.D.   On: 10/17/2024 10:22       LOS: 5 days   Sinaya Minogue Verdene  [facility] Pager on [email or web address]  10/18/2024, 10:08 AM   "

## 2024-10-18 NOTE — Progress Notes (Addendum)
 Patient stated he got up during the night and vomited dark red blood in the toilet.  Denies any coffee ground like emesis.  BP stable, no nausea at present.  Abdomen is firm and patient is tender on palpation to LUQ/LLQ.  AM labs pending.  Advised patient to notify nurse if any further bleeding occurs for us  to assess.  Dr. Lee notified.

## 2024-10-18 NOTE — Consult Note (Addendum)
 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 Admitted with hematemesis.  Still need to consider esophageal variceal bleed.  Rule out PUD.  Erosive disease.  No evidence of hepatic encephalopathy  Patient is hemodynamically stable.  Hemoglobin stable.  MELD 3.0: 27 at 11/28/2023  8:27 AM MELD-Na: 29 at 11/28/2023  8:27 AM  10/17/24>> 1.9 L para. No SBP by cell count   Continue twice daily IV pantoprazole  Continue empiric octreotide  infusion Continue empiric Rocephin  Patient will need EGD.  Will plan for tomorrow since patient has not had any further episodes of hematemesis , hemoglobin is stable and there are plans for dialysis today .  The risks and benefits of EGD with possible biopsies were discussed with the patient who agrees to proceed.  Await final ascitic fluid culture from 10/17/2024 Patient can eat today from GI standpoint, needs 2 g sodium restricted diet.  Abnormal small bowel on imaging KUB today showing dilated bowel loop in right mid abdomen.  Need to evaluate for obstructive process that led to nausea / vomiting. Primary team ordered CT AP  Multivessel CAD Acute on chronic systolic heart failure Heart Failure Team and Cardiology have been following   AFib Eliquis  has been on hold for cardiac evaluation. Heparin  currently on hold for GI bleed  Chronic anemia  His hemoglobin is stable at 9.2  End-stage renal disease on HD  Diabetes  Hypertension  History colon polyps Small tubular adenoma February 2023  History of H. pylori gastritis February 2023 ( Dr.  Brooklyn)  See PMH for any additional medical history  / medical problems  Principal Problem:   Acute on chronic systolic heart failure (HCC) Active Problems:   End-stage renal disease on hemodialysis (HCC)   GERD (gastroesophageal reflux disease)   Paroxysmal atrial fibrillation (HCC)   CAD (coronary artery disease)   History of anemia due to chronic kidney disease   HPI   Brief History:  Patient presented to [facility] 12/30 for elective LHC and RHC cath,  found to have severe multvessel disease.  He was later transferred to [facility] for treatment options .  Yesterday, he reportedly had 4 episodes of hematemesis (dark red blood).  He was taking Eliquis  but was taken off of it several days ago in preparation for the heart cath.  Patient denies any prior history of gastrointestinal bleeding. Denies NSAID use. He doesn't know if he takes any GI medications at home or if he takes any diuretics at home.  He reports having blood in stool this am but cannot describe characteristics of the blood. Has abdominal discomfort at paracentesis site.   +++++++++++++++++++++++  Last endoscopic evaluation May 2025 EGD Dr. Unk - varices screening - Normal duodenal bulb and second portion of the duodenum. - Erythematous mucosa in the gastric body and antrum. Biopsied. - Medium-sized hiatal hernia. - Granular mucosa in the esophagus. Biopsied. - Normal esophagus.  Recent Labs    10/15/24 0954 10/16/24 0446 10/18/24 0700  PROT  --   --  7.1  ALBUMIN  3.2* 2.9* 2.9*  AST  --   --  18  ALT  --   --  7  ALKPHOS  --   --  117  BILITOT  --   --  0.7   Recent Labs    10/16/24 0446 10/17/24 0517 10/18/24 0432  WBC 8.1 10.0 13.4*  HGB 8.8* 8.9* 9.2*  HCT 27.1* 27.5* 27.6*  MCV 91.9 92.9 90.2  PLT 168 167 158   Recent Labs    10/15/24 0954 10/16/24 0446 10/18/24 0700  NA 135 134* 132*  K 4.0 3.8 4.8  CL 97* 98 96*  CO2 22 25 20*  GLUCOSE 100* 100* 83   BUN 29* 38* 66*  CREATININE 4.38* 5.37* 8.16*  CALCIUM  9.1 8.7* 8.8*     DG Abd 1 View EXAM: 1 VIEW XRAY OF THE ABDOMEN 10/18/2024 06:38:33 AM  COMPARISON: CT abdomen and pelvis 01/21/2024.  CLINICAL HISTORY: Intractable abdominal pain.  FINDINGS:  BOWEL: Nonobstructive bowel gas pattern. There is a single mildly dilated small bowel segment in the right mid abdomen measuring 3.5 cm. There is colonic gas through at least to the mid-descending segment.  SOFT TISSUES: Vascular calcifications are noted in the pelvis. The atrophic renal shadows are not well seen.  BONES: No acute fracture. There is degenerative change of the lumbar spine and hips.  There is no supine evidence of free air.  IMPRESSION: 1. Single mildly dilated small bowel segment in the right mid abdomen, which may reflect focal ileus or early small bowel obstruction. 2. No supine evidence of free air.  Electronically signed by: Francis Quam MD 10/18/2024 07:00 AM EST RP Workstation: [id number]    Review of Systems: All systems reviewed and negative except where noted in HPI.  Physical Exam: Vital signs in last 24 hours: Temp:  [99.4 F (37.4 C)-100.4 F (38 C)] 99.4 F (37.4 C) (01/06 0904) Pulse Rate:  [97-109] 105 (01/06 0904) Resp:  [16-26] 22 (01/06 0904) BP: (117-145)/(73-86) 145/86 (01/06 0904) SpO2:  [94 %-97 %] 94 % (01/06 0440)   General:   Thinale in NAD Psych:  Cooperative. Flat affect Eyes: Pupils equal Ears:  Normal auditory acuity Nose: No deformity, discharge or lesions Neck:  Supple, no masses felt Lungs:  Clear to auscultation.  Heart:  Regular rate, regular rhythm.  Abdomen:  Soft, moderately to largely distended, moderate R tenderness,  active bowel sounds, no masses felt Rectal :  Deferred Msk: Symmetrical without gross deformities.  Neurologic:  Alert, oriented, grossly normal neurologically Extremities : No edema Skin:  Intact without significant lesions.    OUTPATIENT MEDICATIONS Prior to Admission medications  Medication Sig Start Date End Date Taking? Authorizing Provider  [Paused] apixaban  (ELIQUIS ) 5 MG TABS tablet Take 1 tablet (5 mg total) by mouth 2 (two) times daily. Wait to take this until your doctor or other care provider tells you to start again. 08/16/24  Yes Gollan, Timothy J, MD  calcium  acetate (PHOSLO ) 667 MG capsule Take 2 capsules (1,334 mg total) by mouth 3 (three) times a week. 10/14/24  Yes Lenon Marien CROME, MD  hydrALAZINE  (APRESOLINE ) 25 MG tablet Take 25 mg by mouth 3 (three) times daily.   Yes [provider]  hydrOXYzine  (ATARAX ) 25 MG tablet Take 25 mg by mouth 2 (two) times daily. 09/22/24  Yes [provider]  Multiple Vitamin (MULTIVITAMIN ADULT PO) Take 1 tablet by mouth daily.   Yes [provider]  omeprazole  (PRILOSEC) 20 MG capsule Take 20 mg by mouth every morning. 07/02/24  Yes [provider]  tamsulosin  (FLOMAX ) 0.4 MG CAPS capsule Take 0.4 mg by mouth daily. 09/19/24  Yes [provider]  albumin  human 25 % bottle Inject into the vein. Patient not taking: Reported on 10/14/2024    [provider]  amLODipine  (NORVASC ) 10 MG tablet Take 10 mg by mouth daily. Patient not taking: Reported on 10/14/2024    [provider]  atorvastatin  (LIPITOR) 40 MG tablet Take 1 tablet (40 mg total) by mouth daily. 10/13/24   Lenon Marien CROME, MD  epoetin  alfa-epbx (RETACRIT ) 10000 UNIT/ML injection Inject 1 mL (10,000 Units total) into the vein every Monday, Wednesday, and Friday at 6 PM. Patient not taking: Reported on 10/14/2024 10/12/24   Lenon Marien CROME, MD  feeding supplement (ENSURE PLUS HIGH PROTEIN) LIQD Take 237 mLs by mouth 2 (two) times daily between meals. 10/12/24   Lenon Marien CROME, MD  heparin  25000 UT/250ML infusion Inject 1,350 Units/hr into the vein continuous. 10/12/24   Lenon Marien CROME, MD  irbesartan  (AVAPRO ) 300 MG tablet Take 300 mg  by mouth daily. Patient not taking: Reported on 10/14/2024    [provider]  melatonin 5 MG TABS Take 5 mg by mouth at bedtime as needed (sleep). Patient not taking: Reported on 10/14/2024    [provider]  metoprolol  tartrate (LOPRESSOR ) 25 MG tablet Take 1 tablet (25 mg total) by mouth 2 (two) times daily. Patient not taking: Reported on 10/14/2024 10/12/24   Lenon Marien CROME, MD  sacubitril -valsartan  (ENTRESTO ) 49-51 MG Take 1 tablet by mouth 2 (two) times daily. 10/13/24   Gerard Frederick, NP    Allergies as of 10/12/2024   (No Known Allergies)    INPATIENT MEDICATIONS Current Facility-Administered Medications  Medication Dose Route Frequency Provider Last Rate Last Admin   (feeding supplement) PROSource Plus liquid 30 mL  30 mL Oral BID BM Stovall, Kathryn R, PA-C   30 mL at 10/17/24 1323   acetaminophen  (TYLENOL ) tablet 650 mg  650 mg Oral Q6H PRN Howerter, Justin B, DO   650 mg at 10/17/24 1844   Or   acetaminophen  (TYLENOL ) suppository 650 mg  650 mg Rectal Q6H PRN Howerter, Justin B, DO       aspirin  EC tablet 81 mg  81 mg Oral Daily Sundil, Subrina, MD       atorvastatin  (LIPITOR) tablet 40 mg  40 mg Oral Daily Howerter, Justin B, DO   40 mg at 10/17/24 1041   cefTRIAXone  (ROCEPHIN ) 2 g in sodium chloride  0.9 % 100 mL IVPB  2 g Intravenous Q24H Sundil, Subrina, MD 200 mL/hr at 10/18/24 0708 2 g at 10/18/24 0708   Chlorhexidine  Gluconate Cloth 2 % PADS 6 each  6 each Topical Q0600 Jerrye Katheryn BROCKS, MD   6 each at 10/18/24 0600   ezetimibe  (ZETIA ) tablet 10 mg  10 mg Oral Daily Floretta Mallard, MD   10 mg at 10/17/24 1041   hydrALAZINE  (APRESOLINE ) tablet 50 mg  50 mg Oral Q8H McLean, Dalton S, MD   50 mg at 10/17/24 2008  isosorbide  mononitrate (IMDUR ) 24 hr tablet 30 mg  30 mg Oral Daily McLean, Dalton S, MD   30 mg at 10/17/24 1041   melatonin tablet 3 mg  3 mg Oral QHS PRN Howerter, Justin B, DO   3 mg at 10/13/24 2300   octreotide  (SANDOSTATIN ) 500 mcg in  sodium chloride  0.9 % 250 mL (2 mcg/mL) infusion  50 mcg/hr Intravenous Continuous Sundil, Subrina, MD 25 mL/hr at 10/18/24 0743 50 mcg/hr at 10/18/24 0743   ondansetron  (ZOFRAN ) injection 4 mg  4 mg Intravenous Q6H PRN Howerter, Justin B, DO       pantoprazole  (PROTONIX ) injection 40 mg  40 mg Intravenous Q12H Sundil, Subrina, MD       pneumococcal 20-valent conjugate vaccine (PREVNAR 20 ) injection 0.5 mL  0.5 mL Intramuscular Tomorrow-1000 Verdene Purchase, MD       sacubitril -valsartan  (ENTRESTO ) 49-51 mg per tablet  1 tablet Oral BID Howerter, Justin B, DO   1 tablet at 10/17/24 2009     Past Medical History:  Diagnosis Date   AKI (acute kidney injury) 08/30/2018   Anemia of chronic disease    Chronic systolic heart failure (HCC)    Diabetes mellitus without complication (HCC)    no meds since weight loss   Dialysis patient    Mon, Wed, Fri   ESRD (end stage renal disease) (HCC)    on HD (M,W,F)   Hyperkalemia 02/04/2021   Hypertension    Metabolic acidosis, increased anion gap 02/04/2021   Paroxysmal atrial fibrillation (HCC)    Renal disorder    Sepsis without acute organ dysfunction (HCC) 11/30/2023    Past Surgical History:  Procedure Laterality Date   CATARACT EXTRACTION W/PHACO Right 10/08/2023   Procedure: CATARACT EXTRACTION PHACO AND INTRAOCULAR LENS PLACEMENT (IOC) RIGHT DIABETIC 12.19 01:08.8;  Surgeon: Enola Feliciano Hugger, MD;  Location: [facility];  Service: Ophthalmology;  Laterality: Right;   CATARACT EXTRACTION W/PHACO Left 10/22/2023   Procedure: CATARACT EXTRACTION PHACO AND INTRAOCULAR LENS PLACEMENT (IOC) LEFT DIABETIC MALYUGIN;  Surgeon: Enola Feliciano Hugger, MD;  Location: [facility];  Service: Ophthalmology;  Laterality: Left;  7.66 0:54.2   COLONOSCOPY WITH PROPOFOL  N/A 11/21/2021   Procedure: COLONOSCOPY WITH PROPOFOL ;  Surgeon: Unk Corinn Skiff, MD;  Location: [facility] ENDOSCOPY;  Service: Gastroenterology;  Laterality: N/A;    DIALYSIS/PERMA CATHETER INSERTION Right 05/20/2024   Procedure: DIALYSIS/PERMA CATHETER INSERTION;  Surgeon: Marea Selinda RAMAN, MD;  Location: [facility] INVASIVE CV LAB;  Service: Cardiovascular;  Laterality: Right;   DIALYSIS/PERMA CATHETER REPAIR N/A 12/15/2023   Procedure: DIALYSIS/PERMA CATHETER REPAIR;  Surgeon: Jama Cordella MATSU, MD;  Location: [facility] INVASIVE CV LAB;  Service: Cardiovascular;  Laterality: N/A;   ESOPHAGOGASTRODUODENOSCOPY N/A 03/03/2024   Procedure: EGD (ESOPHAGOGASTRODUODENOSCOPY);  Surgeon: Unk Corinn Skiff, MD;  Location: [facility] ENDOSCOPY;  Service: Gastroenterology;  Laterality: N/A;   ESOPHAGOGASTRODUODENOSCOPY (EGD) WITH PROPOFOL  N/A 11/21/2021   Procedure: ESOPHAGOGASTRODUODENOSCOPY (EGD) WITH PROPOFOL ;  Surgeon: Unk Corinn Skiff, MD;  Location: [facility] ENDOSCOPY;  Service: Gastroenterology;  Laterality: N/A;   INSERTION OF DIALYSIS CATHETER     IR PARACENTESIS  10/17/2024   LEG SURGERY  1987   RIGHT/LEFT HEART CATH AND CORONARY ANGIOGRAPHY N/A 10/11/2024   Procedure: RIGHT/LEFT HEART CATH AND CORONARY ANGIOGRAPHY;  Surgeon: Mady Bruckner, MD;  Location: [facility] INVASIVE CV LAB;  Service: Cardiovascular;  Laterality: N/A;    Family History  Problem Relation Age of Onset   Diabetes Mellitus II Sister    Diabetes Mellitus II Paternal Grandmother  Social History   Socioeconomic History   Marital status: Single    Spouse name: Not on file   Number of children: Not on file   Years of education: Not on file   Highest education level: Not on file  Occupational History   Not on file  Tobacco Use   Smoking status: Former    Types: Cigarettes   Smokeless tobacco: Never  Vaping Use   Vaping status: Never Used  Substance and Sexual Activity   Alcohol use: Yes    Alcohol/week: 7.0 standard drinks of alcohol    Types: 7 Cans of beer per week    Comment: no drinking 10/2023   Drug use: Not Currently    Types: Cocaine , Marijuana    Comment: 09/24/23 - Marijuana 3x/week. No  cocaine  since Friday 08/27/2018   Sexual activity: Not on file  Other Topics Concern   Not on file  Social History Narrative   Not on file   Social Drivers of Health   Tobacco Use: Medium Risk (10/13/2024)   Patient History    Smoking Tobacco Use: Former    Smokeless Tobacco Use: Never    Passive Exposure: Not on file  Financial Resource Strain: Not on file  Food Insecurity: Patient Declined (10/14/2024)   Epic    Worried About Programme Researcher, Broadcasting/film/video in the Last Year: Patient declined    Barista in the Last Year: Patient declined  Transportation Needs: No Transportation Needs (10/14/2024)   Epic    Lack of Transportation (Medical): No    Lack of Transportation (Non-Medical): No  Physical Activity: Not on file  Stress: Not on file  Social Connections: Not on file  Intimate Partner Violence: Not At Risk (10/14/2024)   Epic    Fear of Current or Ex-Partner: No    Emotionally Abused: No    Physically Abused: No    Sexually Abused: No  Depression (PHQ2-9): Not on file  Alcohol Screen: Not on file  Housing: Low Risk (10/14/2024)   Epic    Unable to Pay for Housing in the Last Year: No    Number of Times Moved in the Last Year: 0    Homeless in the Last Year: No  Utilities: Not At Risk (10/14/2024)   Epic    Threatened with loss of utilities: No  Health Literacy: Not on file    Code Status   Code Status: Full Code   Vina Dasen, NP-C   10/18/2024, 9:20 AM  -------------------------------------------------------------------------------------------  I have taken a history, reviewed the chart and examined the patient. I performed a substantive portion of this encounter, including complete performance of at least one of the key components, in conjunction with the APP. I agree with the APP's note, impression and recommendations  64 year old male with history of end-stage renal disease on dialysis, decompensated HCV cirrhosis with history of ascites, transferred from [place]  after being found to have severe coronary artery disease to be assessed for revascularization options.  Patient subsequently found to be not a candidate for any revascularization options and medical treatment was recommended for his severe coronary artery disease. GI consulted overnight after the patient experienced multiple episodes of hematemesis.  On my discussion with the patient, he states that he was having bleeding from his nose earlier that day.  Earlier this morning he had the urge to have a bowel movement and got nauseated and had multiple episodes of bright red hematemesis with mucus.  He states that this was  not large-volume blood.  He did not experience any weakness, dizziness or lightheadedness.  He denied having any overtly bloody stools to me.  His hemoglobin has remained stable and he has remained hemodynamically stable as well.  He had an EGD earlier this year that did not show any esophageal varices. Overall, my concern that he is having a major upper GI bleed from esophageal varices is quite low.  More suspicious of vomiting blood from nasopharyngeal source, but do agree that an upper endoscopy is warranted.  I do not think an emergent endoscopy is necessary today, and believe that we have time to medically optimize him and allow him to get some fluid off at dialysis.    It was explained to the patient that he is at quite high risk from a sedation standpoint given his severe heart disease as well as chronic kidney and liver disease.  The patient also has been complaining of abdominal pain since his paracentesis, and is noted to have a persistent anterior complex fluid collection, which has been noted since February of this year.  The patient has very tender induration corresponding to this area.  He tells me that this tenderness and firmness in his abdomen only developed in the last day or so, and has not been persistent over the months, despite the persistent CT abnormalities.  Suspect  this may be a hematoma.  Ascitic fluid analysis not suggestive of infection.  The patient has been started on empiric antibiotics given his bleeding.  I think this should be adequate for any infectious concerns.  I agree no indication for surgery consultation at this time.  Please continue medical treatment for possible upper GI bleed in cirrhotic with twice daily PPI, octreotide  drip and empiric ceftriaxone  Okay for clear liquid diet today if he does not continue to have hematemesis. Plan for EGD tomorrow. Would continue to hold heparin /Eliquis  pending EGD findings  Scout Guyett E. Stacia, MD [facility]  High complex medical decision making based on the patient's complex medical history and high risk for procedural intervention (this includes chart review, review of results, face-to-face time used for counseling as well as treatment plan and follow-up. The patient was provided an opportunity to ask questions and all were answered. The patient agreed with the plan and demonstrated an understanding of the instructions    "

## 2024-10-18 NOTE — Progress Notes (Signed)
" °   10/18/24 1550  Vitals  Temp 98.7 F (37.1 C)  Temp Source Oral  ECG Heart Rate 98  Post Treatment  Dialyzer Clearance Lightly streaked  Hemodialysis Intake (mL) 0 mL  Liters Processed 84  Fluid Removed (mL) 2500 mL  Tolerated HD Treatment Yes  Hemodialysis Catheter Right Internal jugular Double lumen Permanent (Tunneled)  Placement Date/Time: 05/20/24 1123   Serial / Lot #: [id number]  Expiration Date: 12/10/28  Time Out: Correct patient;Correct site;Correct procedure  Maximum sterile barrier precautions: Hand hygiene;Large sterile sheet;Sterile probe cover;Cap;Mask;Ste...  Site Condition No complications  Blue Lumen Status Flushed;Heparin  locked;Antimicrobial dead end cap  Red Lumen Status Flushed;Heparin  locked;Antimicrobial dead end cap  Purple Lumen Status N/A  Catheter fill solution Heparin  1000 units/ml  Catheter fill volume (Arterial) 1.3 cc  Catheter fill volume (Venous) 1.3  Dressing Type Transparent  Dressing Status Clean, Dry, Intact  Interventions Other (Comment) (Deaccessed.)  Drainage Description None  Dressing Change Due 10/21/24  Post treatment catheter status Capped and Clamped    "

## 2024-10-18 NOTE — Progress Notes (Signed)
 "    Advanced Heart Failure Rounding Note  Cardiologist: Evalene Lunger, MD   Patient Profile   Casey Franco is a 64 y.o. male with history of ESRD on HD, HCV with cirrhosis and ascites/varices, PAF, CAD, ICM.   Admitted after outpatient R/LHC to discuss revascularization options and management of marked volume overload.  Subjective:    Plan for HD today.  Patient with hemoptysis this morning and black sticky stool yesterday. Eliquis /ASA stopped. GI to see today, octreotide  started.   No longer with nausea.   Objective:   Weight Range: 64.8 kg Body mass index is 19.37 kg/m.   Vital Signs:   Temp:  [99.4 F (37.4 C)-100.4 F (38 C)] 99.4 F (37.4 C) (01/06 0904) Pulse Rate:  [97-109] 105 (01/06 0904) Resp:  [16-26] 22 (01/06 0904) BP: (117-145)/(73-86) 145/86 (01/06 0904) SpO2:  [94 %-97 %] 94 % (01/06 0440) Last BM Date : 10/17/24  Weight change: Filed Weights   10/16/24 0937 10/16/24 1307 10/17/24 0440  Weight: 69.2 kg 64.5 kg 64.8 kg    Intake/Output:   Intake/Output Summary (Last 24 hours) at 10/18/2024 1007 Last data filed at 10/18/2024 0743 Gross per 24 hour  Intake 357.3 ml  Output --  Net 357.3 ml     Physical Exam   General:  chronically ill appearing.  No respiratory difficulty Neck: JVD elevated.  Cor: Regular rate & rhythm. No murmurs. Lungs: clear Extremities: no edema  Neuro: alert & oriented x 3. Affect pleasant.   Telemetry   NSR 90s-low 100s (Personally reviewed)    Labs   CBC Recent Labs    10/17/24 0517 10/18/24 0432  WBC 10.0 13.4*  HGB 8.9* 9.2*  HCT 27.5* 27.6*  MCV 92.9 90.2  PLT 167 158   Basic Metabolic Panel Recent Labs    98/95/73 0446 10/18/24 0700  NA 134* 132*  K 3.8 4.8  CL 98 96*  CO2 25 20*  GLUCOSE 100* 83  BUN 38* 66*  CREATININE 5.37* 8.16*  CALCIUM  8.7* 8.8*  PHOS 3.9  --    Liver Function Tests Recent Labs    10/16/24 0446 10/18/24 0700  AST  --  18  ALT  --  7  ALKPHOS  --  117   BILITOT  --  0.7  PROT  --  7.1  ALBUMIN  2.9* 2.9*   No results for input(s): LIPASE, AMYLASE in the last 72 hours. Cardiac Enzymes No results for input(s): CKTOTAL, CKMB, CKMBINDEX, TROPONINI in the last 72 hours.  BNP: BNP (last 3 results) No results for input(s): BNP in the last 8760 hours.  ProBNP (last 3 results) Recent Labs    10/14/24 1044  PROBNP >35,000.0*     D-Dimer No results for input(s): DDIMER in the last 72 hours. Hemoglobin A1C No results for input(s): HGBA1C in the last 72 hours. Fasting Lipid Panel No results for input(s): CHOL, HDL, LDLCALC, TRIG, CHOLHDL, LDLDIRECT in the last 72 hours.  Medications:   Scheduled Medications:  (feeding supplement) PROSource Plus  30 mL Oral BID BM   aspirin  EC  81 mg Oral Daily   atorvastatin   40 mg Oral Daily   Chlorhexidine  Gluconate Cloth  6 each Topical Q0600   ezetimibe   10 mg Oral Daily   hydrALAZINE   50 mg Oral Q8H   isosorbide  mononitrate  30 mg Oral Daily   pantoprazole  (PROTONIX ) IV  40 mg Intravenous Q12H   pneumococcal 20-valent conjugate vaccine  0.5 mL Intramuscular Tomorrow-1000  sacubitril -valsartan   1 tablet Oral BID    Infusions:  cefTRIAXone  (ROCEPHIN )  IV 2 g (10/18/24 0708)   octreotide  (SANDOSTATIN ) 500 mcg in sodium chloride  0.9 % 250 mL (2 mcg/mL) infusion 50 mcg/hr (10/18/24 0743)    PRN Medications: acetaminophen  **OR** acetaminophen , melatonin, ondansetron  (ZOFRAN ) IV  Assessment/Plan   1. CAD: LHC on 10/11/24 showed 75% LM stenosis, 75% ostial LAD, occluded D2, and 99% mLAD.   Not an ACS situation.  He had a Cardiolite with partially reversible anterior defect.  No chest pain.  Not a candidate for PCI or CABG - ASA stopped with hemoptysis - Continue statin + Zetia   2. Acute on chronic systolic CHF: Echo in 10/25 showed EF 35-40%, global HK, severe RV dysfunction and severe RV enlargement, moderate-severe MR.  Suspect ischemic cardiomyopathy.  RHC on  12/30 showed mean RA 33, PA 78/40, mean PCWP 32, CI 3.6.  Marked volume overload with biventricular failure.     - Volume managed with HD - Can continue Entresto  49/51 bid.  - Continue hydralazine  50 tid and Imdur  30 mg daily.  - Start coreg  3.125 mg BID   3. ESRD: As above, markedly volume overloaded on admit. Volume management with HD. Serial HD difficult with high census  4. Cirrhosis: From HCV.  Suspect ascites with distended abdomen.  RV failure and volume overload likely plays a role as well with liver congestion  - S/p paracentesis 1/5. -1.9L   5. Atrial fibrillation: Paroxysmal.  He is on heparin  gtt currently.  He is in NSR.  Poor amiodarone candidate with cirrhosis.  - Now off AC with hemoptysis  6. HTN: BP reasonable - Watch for hypotension during HD - Meds as above  7. Hemoptysis - GI to see today - Continue octreotide  - Now off Eliquis  and ASA - Hgb stable  AHF team will sign off at this time. Has outpatient follow-up scheduled with Cardiology in [place]. He will not need outpatient follow-up with Advanced Heart Failure.    Length of Stay: 5  Beckey LITTIE Coe, NP  10/18/2024, 10:07 AM  Advanced Heart Failure Team   Please visit [email or web address]: For overnight coverage please call cardiology fellow first. If fellow not available call Shock/ECMO MD on call.  For ECMO / Mechanical Support (Impella, IABP, LVAD) issues call Shock / ECMO MD on call.   "

## 2024-10-19 ENCOUNTER — Inpatient Hospital Stay (HOSPITAL_COMMUNITY): Admitting: Certified Registered Nurse Anesthetist

## 2024-10-19 ENCOUNTER — Encounter (HOSPITAL_COMMUNITY)
Admission: EM | Disposition: A | Discharge status: Home / Self Care | Payer: Self-pay | Source: Other Acute Inpatient Hospital | Attending: Internal Medicine

## 2024-10-19 ENCOUNTER — Encounter (HOSPITAL_COMMUNITY): Payer: Self-pay | Admitting: Internal Medicine

## 2024-10-19 DIAGNOSIS — K219 Gastro-esophageal reflux disease without esophagitis: Secondary | ICD-10-CM

## 2024-10-19 DIAGNOSIS — K7469 Other cirrhosis of liver: Secondary | ICD-10-CM | POA: Diagnosis not present

## 2024-10-19 DIAGNOSIS — E1129 Type 2 diabetes mellitus with other diabetic kidney complication: Secondary | ICD-10-CM | POA: Diagnosis not present

## 2024-10-19 DIAGNOSIS — I8500 Esophageal varices without bleeding: Secondary | ICD-10-CM

## 2024-10-19 DIAGNOSIS — I5023 Acute on chronic systolic (congestive) heart failure: Secondary | ICD-10-CM

## 2024-10-19 DIAGNOSIS — N186 End stage renal disease: Secondary | ICD-10-CM | POA: Diagnosis not present

## 2024-10-19 DIAGNOSIS — I10 Essential (primary) hypertension: Secondary | ICD-10-CM | POA: Diagnosis not present

## 2024-10-19 DIAGNOSIS — I2510 Atherosclerotic heart disease of native coronary artery without angina pectoris: Secondary | ICD-10-CM | POA: Diagnosis not present

## 2024-10-19 DIAGNOSIS — Z87891 Personal history of nicotine dependence: Secondary | ICD-10-CM

## 2024-10-19 DIAGNOSIS — I480 Paroxysmal atrial fibrillation: Secondary | ICD-10-CM | POA: Diagnosis not present

## 2024-10-19 DIAGNOSIS — I110 Hypertensive heart disease with heart failure: Secondary | ICD-10-CM

## 2024-10-19 DIAGNOSIS — Z992 Dependence on renal dialysis: Secondary | ICD-10-CM | POA: Diagnosis not present

## 2024-10-19 HISTORY — PX: BIOPSY OF SKIN SUBCUTANEOUS TISSUE AND/OR MUCOUS MEMBRANE: SHX6741

## 2024-10-19 HISTORY — PX: HEMOSTASIS CLIP PLACEMENT: SHX6857

## 2024-10-19 HISTORY — PX: ESOPHAGOGASTRODUODENOSCOPY: SHX5428

## 2024-10-19 LAB — POCT I-STAT, CHEM 8
BUN: 35 mg/dL — ABNORMAL HIGH (ref 8–23)
Calcium, Ion: 1.03 mmol/L — ABNORMAL LOW (ref 1.15–1.40)
Chloride: 97 mmol/L — ABNORMAL LOW (ref 98–111)
Creatinine, Ser: 5.6 mg/dL — ABNORMAL HIGH (ref 0.61–1.24)
Glucose, Bld: 87 mg/dL (ref 70–99)
HCT: 24 % — ABNORMAL LOW (ref 39.0–52.0)
Hemoglobin: 8.2 g/dL — ABNORMAL LOW (ref 13.0–17.0)
Potassium: 4.7 mmol/L (ref 3.5–5.1)
Sodium: 134 mmol/L — ABNORMAL LOW (ref 135–145)
TCO2: 24 mmol/L (ref 22–32)

## 2024-10-19 LAB — CBC
HCT: 25.8 % — ABNORMAL LOW (ref 39.0–52.0)
Hemoglobin: 8.4 g/dL — ABNORMAL LOW (ref 13.0–17.0)
MCH: 29.6 pg (ref 26.0–34.0)
MCHC: 32.6 g/dL (ref 30.0–36.0)
MCV: 90.8 fL (ref 80.0–100.0)
Platelets: 167 K/uL (ref 150–400)
RBC: 2.84 MIL/uL — ABNORMAL LOW (ref 4.22–5.81)
RDW: 16.8 % — ABNORMAL HIGH (ref 11.5–15.5)
WBC: 10.5 K/uL (ref 4.0–10.5)
nRBC: 0 % (ref 0.0–0.2)

## 2024-10-19 MED ORDER — LIDOCAINE HCL (CARDIAC) PF 100 MG/5ML IV SOSY
PREFILLED_SYRINGE | INTRAVENOUS | Status: DC | PRN
  Administered 2024-10-19: 50 mg via INTRAVENOUS

## 2024-10-19 MED ORDER — PROPOFOL 500 MG/50ML IV EMUL
INTRAVENOUS | Status: DC | PRN
  Administered 2024-10-19: 75 ug/kg/min via INTRAVENOUS
  Administered 2024-10-19: 20 mg via INTRAVENOUS
  Administered 2024-10-19: 30 mg via INTRAVENOUS
  Administered 2024-10-19: 20 mg via INTRAVENOUS

## 2024-10-19 MED ORDER — PANTOPRAZOLE SODIUM 40 MG PO TBEC
40.0000 mg | DELAYED_RELEASE_TABLET | Freq: Two times a day (BID) | ORAL | Status: DC
  Administered 2024-10-19 – 2024-10-23 (×8): 40 mg via ORAL
  Filled 2024-10-19 (×8): qty 1

## 2024-10-19 MED ORDER — SODIUM CHLORIDE 0.9 % IV SOLN
INTRAVENOUS | Status: AC | PRN
  Administered 2024-10-19: 500 mL via INTRAVENOUS

## 2024-10-19 MED ORDER — CHLORHEXIDINE GLUCONATE CLOTH 2 % EX PADS
6.0000 | MEDICATED_PAD | Freq: Every day | CUTANEOUS | Status: DC
  Administered 2024-10-20 – 2024-10-22 (×3): 6 via TOPICAL

## 2024-10-19 MED ORDER — SODIUM ZIRCONIUM CYCLOSILICATE 10 G PO PACK
10.0000 g | PACK | ORAL | Status: DC
  Administered 2024-10-19: 10 g via ORAL
  Filled 2024-10-19: qty 1

## 2024-10-19 MED ORDER — SODIUM CHLORIDE 0.9 % IV SOLN: INTRAVENOUS | Status: DC

## 2024-10-19 MED ORDER — PHENYLEPHRINE 80 MCG/ML (10ML) SYRINGE FOR IV PUSH (FOR BLOOD PRESSURE SUPPORT)
PREFILLED_SYRINGE | INTRAVENOUS | Status: DC | PRN
  Administered 2024-10-19 (×3): 80 ug via INTRAVENOUS

## 2024-10-19 NOTE — Assessment & Plan Note (Addendum)
 Portal hypertension and class I esophageal varices.  Acute upper GI bleed with acute blood loss anemia.  EGD with no active bleeding Discharge hgb is 7,9 with hct 24.2   He has briefly placed on octreotide  and SBP prophylaxis with ceftriaxone  IV.  Paracentesis with 1.9 L removed, fluid analysis 85 nucleated cells with 86% monocytes. With no organisms and culture with no growth.  Continue pantoprazole  as outpatient

## 2024-10-19 NOTE — Progress Notes (Addendum)
 " Progress Note   Patient: Casey Franco FMW:[id number] DOB: [birth year] DOA: 10/13/2024     6 DOS: the patient was seen and examined on 10/19/2024   Brief hospital course: Mr. Lohse was admitted to the hospital with the working diagnosis of unstable coronary artery disease.   64 y.o. male with a PMH significant for chronic HFrEF with LVEF 50% on most recent stress test, ESRD on HD MWF, HTN, HLD, PAF on Eliquis , hepatitis C and liver cirrhosis, presented with worsening of exertional dyspnea. Underwent stress test recently which showed a moderate size severe partially reversible defect involving the anterior wall apex and apical inferior segment consistent with scar and peri-infarct ischemia. LVEF 51%. Presented to [facility] for elective LHC and RHC. Cath found L main stenosis.  Patient was subsequently transferred to [facility] for cardiothoracic surgery input.  01/06 patient had episode of hematemesis, patient was placed on pantoprazole  and octreotide  01/07 EGD with grade I esophageal varices with no stigmata of bleeding. Superficial mucosal ter at the gastroesophageal junction with small protuberant vessel, clip placed.   Assessment and Plan: * CAD (coronary artery disease) 10/11/24 cardiac catheterization with 75% left main stenosis, 76% ostial LAD, occluded D2 and 99% m LAD.  Patient not candidate for PCI or CABG.   Plan to continue statin and ezetimibe .  Resume anticoagulation in 24 hrs with IV heparin , if good toleration may resume apixaban    Acute on chronic systolic heart failure (HCC) Echocardiogram with reduced LV systolic function 40 to 45%,  global hypokinesis, interventricular septum flattened in diastole, RV systolic function with moderate reduction, RV with severe enlargement.   Volume status improved with ultrafiltration on HD  Continue with entresto  and carvedilol  Afterload reduction with hydralazine  and isosorbide    Paroxysmal atrial fibrillation (HCC) Continue rate  control with carvedilol   Plan to resume heparin  IV tomorrow  Continue telemetry monitoring   Essential hypertension Continue blood pressure monitoring Continue carvedilol  and entresto    End-stage renal disease on hemodialysis (HCC) Hyperkalemia hyponatremia.   Continue renal replacement therapy per nephrology recommendations, next session will be 01/08   Today BUN 35 with K at 4,7,. Plan for one dose of sodium zirconium to prevent hyperkalemia.  Follow up electrolytes in am.   Other cirrhosis of liver (HCC) Portal hypertension and class I esophageal varices.  Acute upper GI bleed with acute blood loss anemia.  EGD with no active bleeding  Plan to continue pantoprazole   Discontinue octreotide   Follow up cell count in am Ok to start heparin  tomorrow.  Continue prophylactic antibiotic for SBP   Type II diabetes mellitus with renal manifestations (HCC) Continue glucose cover and monitoring with insulin  sliding scale   GERD (gastroesophageal reflux disease) Continue pantoprazole          Subjective: Patient is feeling better, no chest pain and no dyspnea, no further hematemesis or hematochezia, no melena   Physical Exam: Vitals:   10/19/24 1253 10/19/24 1425 10/19/24 1506 10/19/24 1555  BP: 110/70 (!) 111/58  114/74  Pulse: 86 86 85 90  Resp: 13 20 (!) 28 18  Temp: 98.1 F (36.7 C)   98.8 F (37.1 C)  TempSrc: Axillary   Oral  SpO2: 99% 94% 98% 95%  Weight:      Height:       Neurology awake and alert ENT with mild pallor with no icterus Cardiovascular with S1 and S2 present and regular with no gallops, rubs or murmurs No JVD Respiratory with no rales or wheezing, no rhonchi  Abdomen with no distention, soft and non tender No lower extremity edema   Data Reviewed:    Family Communication: I spoke with patient's sister at the bedside, we talked in detail about patient's condition, plan of care and prognosis and all questions were  addressed.   Disposition: Status is: Inpatient Remains inpatient appropriate because: cell count monitoring   Planned Discharge Destination: Home    Author: Elidia Toribio Furnace, MD 10/19/2024 4:33 PM  For on call review [email or web address].  "

## 2024-10-19 NOTE — Plan of Care (Signed)
  Problem: Education: Goal: Knowledge of General Education information will improve Description: Including pain rating scale, medication(s)/side effects and non-pharmacologic comfort measures Outcome: Progressing   Problem: Clinical Measurements: Goal: Ability to maintain clinical measurements within normal limits will improve Outcome: Progressing   Problem: Activity: Goal: Risk for activity intolerance will decrease Outcome: Progressing   Problem: Coping: Goal: Level of anxiety will decrease Outcome: Progressing   Problem: Pain Managment: Goal: General experience of comfort will improve and/or be controlled Outcome: Progressing   Problem: Safety: Goal: Ability to remain free from injury will improve Outcome: Progressing   Problem: Skin Integrity: Goal: Risk for impaired skin integrity will decrease Outcome: Progressing

## 2024-10-19 NOTE — Assessment & Plan Note (Addendum)
 10/11/24 cardiac catheterization with 75% left main stenosis, 76% ostial LAD, occluded D2 and 99% m LAD.  Patient not candidate for PCI or CABG.   Plan to continue statin and ezetimibe .  Resumed anticoagulation with iV heparin 

## 2024-10-19 NOTE — Interval H&P Note (Signed)
 History and Physical Interval Note:  10/19/2024 8:24 AM  Casey Franco  has presented today for surgery, with the diagnosis of Hematemesis.  The various methods of treatment have been discussed with the patient and family. After consideration of risks, benefits and other options for treatment, the patient has consented to  Procedures: EGD (ESOPHAGOGASTRODUODENOSCOPY) (N/A) as a surgical intervention.  The patient's history has been reviewed, patient examined, no change in status, stable for surgery.  I have reviewed the patient's chart and labs.  Questions were answered to the patient's satisfaction.    No further episodes of hematemesis.  No melenic stool.  Hgb dropped slightly.  Abdominal tenderness improving  Glendia FORBES Holt

## 2024-10-19 NOTE — Assessment & Plan Note (Addendum)
 Continue rate control with carvedilol   Patient will continue anticoagulation with apixaban 

## 2024-10-19 NOTE — Assessment & Plan Note (Signed)
"     Continue pantoprazole         "

## 2024-10-19 NOTE — Assessment & Plan Note (Addendum)
 Patient was placed on insulin  sliding scale for glucose cover and monitoring during his hospitalization.  Glucose remained well controlled  Continue statin for dyslipidemia

## 2024-10-19 NOTE — Transfer of Care (Signed)
 Immediate Anesthesia Transfer of Care Note  Patient: Casey Franco  Procedure(s) Performed: EGD (ESOPHAGOGASTRODUODENOSCOPY)  Patient Location: Endoscopy Unit  Anesthesia Type:MAC  Level of Consciousness: drowsy  Airway & Oxygen Therapy: Patient Spontanous Breathing and Patient connected to nasal cannula oxygen  Post-op Assessment: Report given to RN and Post -op Vital signs reviewed and stable  Post vital signs: Reviewed and stable  Last Vitals:  Vitals Value Taken Time  BP 97/62 10/19/24 08:50  Temp    Pulse 88 10/19/24 08:51  Resp 24 10/19/24 08:51  SpO2 99 % 10/19/24 08:51  Vitals shown include unfiled device data.  Last Pain:  Vitals:   10/19/24 0810  TempSrc: Oral  PainSc: 0-No pain      Patients Stated Pain Goal: 0 (10/17/24 2030)  Complications: No notable events documented.

## 2024-10-19 NOTE — Op Note (Signed)
 [facility], [place] Patient Name: Casey Franco Procedure Date : 10/19/2024 MRN: [id number] Attending MD: Glendia BRAVO. Stacia , MD, [id number] Date of Birth: 04/02/1961 CSN: [id number] Age: 64 Admit Type: Inpatient Procedure:                Upper GI endoscopy Indications:              Hematemesis Providers:                Glendia E. Stacia, MD, Clotilda Schmitz, RN, Curtistine Bishop, Technician Referring MD:             Marien CROME. Anderson Medicines:                Monitored Anesthesia Care Complications:            No immediate complications. Estimated Blood Loss:     Estimated blood loss was minimal. Procedure:                Pre-Anesthesia Assessment:                           - Prior to the procedure, a History and Physical                            was performed, and patient medications and                            allergies were reviewed. The patient's tolerance of                            previous anesthesia was also reviewed. The risks                            and benefits of the procedure and the sedation                            options and risks were discussed with the patient.                            All questions were answered, and informed consent                            was obtained. Prior Anticoagulants: The patient has                            taken Eliquis  (apixaban ), last dose was 11 days                            prior to procedure. ASA Grade Assessment: IV - A                            patient with severe systemic disease that is a  constant threat to life. After reviewing the risks                            and benefits, the patient was deemed in                            satisfactory condition to undergo the procedure.                           After obtaining informed consent, the endoscope was                            passed under direct vision. Throughout the                             procedure, the patient's blood pressure, pulse, and                            oxygen saturations were monitored continuously. The                            [id number]-[id number] ([id number]) Olympus endoscope was introduced                            through the mouth, and advanced to the second part                            of duodenum. The upper GI endoscopy was                            accomplished without difficulty. The patient                            tolerated the procedure well. Scope In: Scope Out: Findings:      Grade I varices were found in the lower third of the esophagus. They       were small in size.      A small non-bleeding superficial mucosal tear with a prutuberant visible       vessel was found at the gastroesophageal junction. This measured 3 mm in       length. For hemostasis, one hemostatic clip was successfully placed (MR       conditional). Clip manufacturer: [employer]. There was no       bleeding during, or at the end, of the procedure.      The exam of the esophagus was otherwise normal.      Moderate portal hypertensive gastropathy was found in the gastric fundus       and in the gastric body. Biopsies were taken with a cold forceps for       histology. Estimated blood loss was minimal.      A 3 cm hiatal hernia was present.      The exam of the stomach was otherwise normal.      The examined duodenum was normal. Impression:               - Grade I esophageal varices.  These were small in                            size with no stigmata. Not amenable to banding, and                            not a source of hematemesis                           - Superficial mucosal tear at the gastroesophageal                            junction with small protuberant vessel. Clip (MR                            conditional) was placed. Clip manufacturer: [employer]. This may have been induced by vomiting.                           -  Portal hypertensive gastropathy. Biopsied.                           - 3 cm hiatal hernia.                           - Normal examined duodenum. Moderate Sedation:      N/A Recommendation:           - Return patient to hospital ward for ongoing care.                           - Ok to resume regular diet today.                           - Continue present medications.                           - Ok to stop octreotide .                           - Would continue empiric antibiotics for 5 days                            total given GI bleed in cirrhotic.                           - Resume heparin  drip tomorrow morning. If no                            further bleeding after 24 hours on heparin  gtt,                            okay to resume Eliquis  given high risk of  clot/infarction.                           - Await pathology results.                           - GI will sign off at this time.                           - Patient can follow up with outpatient                            gastroenterologist for ongoing management of                            cirrhosis. Procedure Code(s):        --- Professional ---                           [phone], 59, Esophagogastroduodenoscopy, flexible,                            transoral; with control of bleeding, any method                           43239, Esophagogastroduodenoscopy, flexible,                            transoral; with biopsy, single or multiple Diagnosis Code(s):        --- Professional ---                           I85.00, Esophageal varices without bleeding                           S27.818A, Other injury of esophagus (thoracic                            part), initial encounter                           K76.6, Portal hypertension                           K31.89, Other diseases of stomach and duodenum                           K44.9, Diaphragmatic hernia without obstruction or                             gangrene                           K92.0, Hematemesis CPT copyright 2022 American [employer]. All rights reserved. The codes documented in this report are preliminary and upon coder review may  be revised to meet current compliance requirements. Iziah Cates E. Stacia, MD 10/19/2024 9:01:35 AM This report has been signed electronically. Number of Addenda: 0

## 2024-10-19 NOTE — Assessment & Plan Note (Addendum)
 Echocardiogram with reduced LV systolic function 40 to 45%,  global hypokinesis, interventricular septum flattened in diastole, RV systolic function with moderate reduction, RV with severe enlargement.   Biventricular failure.   Volume status improved with ultrafiltration on HD  Continue with entresto  and carvedilol  Afterload reduction with hydralazine  and isosorbide 

## 2024-10-19 NOTE — Anesthesia Postprocedure Evaluation (Signed)
"   Anesthesia Post Note  Patient: Casey Franco  Procedure(s) Performed: EGD (ESOPHAGOGASTRODUODENOSCOPY)     Patient location during evaluation: PACU Anesthesia Type: MAC Level of consciousness: awake Pain management: pain level controlled Vital Signs Assessment: post-procedure vital signs reviewed and stable Respiratory status: spontaneous breathing, nonlabored ventilation and respiratory function stable Cardiovascular status: stable and blood pressure returned to baseline Postop Assessment: no apparent nausea or vomiting Anesthetic complications: no   No notable events documented.  Last Vitals:  Vitals:   10/19/24 0920 10/19/24 0955  BP: 121/77 118/67  Pulse: 87 91  Resp: 19 18  Temp:  36.8 C  SpO2: 94% 95%    Last Pain:  Vitals:   10/19/24 0955  TempSrc: Oral  PainSc:                  Delon Aisha Arch      "

## 2024-10-19 NOTE — Progress Notes (Signed)
 " [facility] Progress Note   Subjective:    Seen and examined patient at bedside. Tolerated yesterday's HD with net UF 2.3L. Patient's family member also at bedside. S/p upper endoscopy today. Dialysis off schedule given high patient census. Next HD 1/8.  Objective Vitals:   10/19/24 0910 10/19/24 0920 10/19/24 0955 10/19/24 1253  BP: (!) 94/54 121/77 118/67 110/70  Pulse: 87 87 91 86  Resp: (!) 28 19 18 13   Temp:   98.2 F (36.8 C) 98.1 F (36.7 C)  TempSrc:   Oral Axillary  SpO2: 92% 94% 95% 99%  Weight:      Height:       Physical Exam General: Well appearing, NAD. Room air Heart: RRR Lungs: CTA upper lobes, bibasilar rales Abdomen: soft, less distended Extremities: no LE edema Dialysis Access: [facility]  Filed Weights   10/18/24 1152 10/18/24 1555  Weight: 66 kg 63 kg    Intake/Output Summary (Last 24 hours) at 10/19/2024 1329 Last data filed at 10/19/2024 0844 Gross per 24 hour  Intake 301.68 ml  Output 2500 ml  Net -2198.32 ml    Additional Objective Labs: Basic Metabolic Panel: Recent Labs  Lab 10/15/24 0954 10/16/24 0446 10/18/24 0700 10/18/24 1218  NA 135 134* 132* 133*  K 4.0 3.8 4.8 5.6*  CL 97* 98 96* 96*  CO2 22 25 20* 20*  GLUCOSE 100* 100* 83 66*  BUN 29* 38* 66* 70*  CREATININE 4.38* 5.37* 8.16* 8.39*  CALCIUM  9.1 8.7* 8.8* 8.6*  PHOS 3.4 3.9  --  4.8*   Liver Function Tests: Recent Labs  Lab 10/14/24 1044 10/15/24 0954 10/16/24 0446 10/18/24 0700 10/18/24 1218  AST 19  --   --  18  --   ALT 8  --   --  7  --   ALKPHOS 141*  --   --  117  --   BILITOT 0.6  --   --  0.7  --   PROT 7.7  --   --  7.1  --   ALBUMIN  3.3*   < > 2.9* 2.9* 2.8*   < > = values in this interval not displayed.   No results for input(s): LIPASE, AMYLASE in the last 168 hours. CBC: Recent Labs  Lab 10/14/24 1044 10/14/24 1933 10/15/24 0954 10/16/24 0446 10/17/24 0517 10/18/24 0432 10/18/24 1944 10/19/24 0439  WBC 6.9   < > 7.7 8.1  10.0 13.4*  --  10.5  NEUTROABS 5.0  --   --   --   --   --   --   --   HGB 9.3*   < > 10.6* 8.8* 8.9* 9.2* 8.3* 8.4*  HCT 28.4*   < > 32.5* 27.1* 27.5* 27.6* 25.3* 25.8*  MCV 90.7   < > 92.3 91.9 92.9 90.2  --  90.8  PLT 172   < > 201 168 167 158  --  167   < > = values in this interval not displayed.   Blood Culture    Component Value Date/Time   SDES BLOOD SITE NOT SPECIFIED 10/18/2024 0701   SPECREQUEST  10/18/2024 0701    BOTTLES DRAWN AEROBIC AND ANAEROBIC Blood Culture adequate volume   CULT  10/18/2024 0701    NO GROWTH 1 DAY Performed at [facility], [address] [place], [place] [address]    REPTSTATUS PENDING 10/18/2024 0701    Cardiac Enzymes: No results for input(s): CKTOTAL, CKMB, CKMBINDEX, TROPONINI in the last  168 hours. CBG: No results for input(s): GLUCAP in the last 168 hours. Iron Studies: No results for input(s): IRON, TIBC, TRANSFERRIN, FERRITIN in the last 72 hours. Lab Results  Component Value Date   INR 1.3 (H) 05/23/2024   INR 1.2 12/31/2023   INR 1.9 (H) 11/26/2023   Studies/Results: CT ABDOMEN PELVIS W CONTRAST Addendum Date: 10/18/2024 ADDENDUM REPORT: 10/18/2024 10:36 ADDENDUM: These results were called by telephone at the time of interpretation on 10/18/2024 at 10:34 a.m. to provider [facility] , who verbally acknowledged these results. Electronically Signed   By: Toribio Agreste M.D.   On: 10/18/2024 10:36   Result Date: 10/18/2024 CLINICAL DATA:  Abdominal pain and ascites. Possible small bowel obstruction. Recent paracentesis 10/17/2024. EXAM: CT ABDOMEN AND PELVIS WITH CONTRAST TECHNIQUE: Multidetector CT imaging of the abdomen and pelvis was performed using the standard protocol following bolus administration of intravenous contrast. RADIATION DOSE REDUCTION: This exam was performed according to the departmental dose-optimization program which includes automated exposure control, adjustment of the mA and/or kV  according to patient size and/or use of iterative reconstruction technique. CONTRAST:  75mL OMNIPAQUE  IOHEXOL  350 MG/ML SOLN COMPARISON:  01/21/2024, 11/26/2023 FINDINGS: Lower chest: Mild stable cardiomegaly. Calcified plaque over the 3 vessel coronary arteries. Mild calcification over the mitral valve annulus. Moderate right pleural effusion with bibasilar dependent atelectatic change. Hepatobiliary: Mild diffuse low-attenuation of the liver with mild nodular contour compatible with known cirrhosis. No focal liver mass. Gallbladder and biliary tree are normal. Pancreas: Normal. Spleen: Normal. Adrenals/Urinary Tract: Adrenal glands are normal. Stable atrophic kidneys with minimal cystic change. No hydronephrosis or nephrolithiasis. Bladder is contracted with mild ill definition of the wall. Ureters are normal. Stomach/Bowel: Stomach and small bowel are unremarkable. No dilated small bowel loops. Diverticulosis of the colon most prominent over the descending and sigmoid colon. Subtle wall thickening throughout much of the colon which could be seen due to mild acute colitis although may simply be related to patient's cirrhosis and hypoalbuminemia. Appendix is normal. Vascular/Lymphatic: Mild calcified plaque over the abdominal aorta which is normal in caliber. Remaining vascular structures are unremarkable. No adenopathy. Reproductive: Prostate is unremarkable. Other: Mild-to-moderate ascites present without significant change from the prior exams. Note that the previous exam from 11/26/2023 demonstrate a large fluid and air collection throughout the abdomen/pelvis measuring approximately 28 cm in diameter which decreased in size on follow-up CT 01/21/2024 measuring 15 cm at which time it appeared to represent simple fluid. The current exam demonstrates a large mixed density collection predominately centered over the anterior abdominal wall in the midline extending both above and below the level of the umbilicus.  This measures approximately 7.6 x 14.3 cm in AP and transverse dimension and proximally 12.8 cm in craniocaudal dimension. Note that the may be a component of this collection extending into the peritoneum in the region of the previously seen focal walled-off fluid collection. These findings likely represent hemorrhagic debris/hematoma possibly from recent intervention. No acute vascular extravasation identified. Mild diffuse subcutaneous edema. Musculoskeletal: Screws noted. The spine unchanged. Minimal loss of mid prevertebral body height at the Schmorl's nodes involving T10 and T11 unchanged. IMPRESSION: 1. Large mixed density collection predominately centered over the anterior abdominal wall in the midline extending both above and below the level of the umbilicus measuring approximately 7.6 x 14.3 x 12.8 cm. Note that there may be a component of this collection extending into the peritoneum in the region of the previously seen focal walled-off fluid collection. These findings likely represent  hemorrhagic debris/hematoma from recent intervention, although could represent progression with complication of the previously seen fluid collection. No acute vascular extravasation identified. 2. Mild-to-moderate ascites without significant change from the prior exams. 3. Evidence of patient's known cirrhosis. 4. Colonic diverticulosis. Subtle wall thickening throughout much of the colon which could be seen due to mild acute colitis, although may simply be related to patient's cirrhosis and hypoalbuminemia. 5. Moderate right pleural effusion with bibasilar dependent atelectatic change. 6. Aortic atherosclerosis. Atherosclerotic coronary artery disease. Aortic Atherosclerosis (ICD10-I70.0). Electronically Signed: By: Toribio Agreste M.D. On: 10/18/2024 10:27   DG Abd 1 View Result Date: 10/18/2024 EXAM: 1 VIEW XRAY OF THE ABDOMEN 10/18/2024 06:38:33 AM COMPARISON: CT abdomen and pelvis 01/21/2024. CLINICAL HISTORY:  Intractable abdominal pain. FINDINGS: BOWEL: Nonobstructive bowel gas pattern. There is a single mildly dilated small bowel segment in the right mid abdomen measuring 3.5 cm. There is colonic gas through at least to the mid-descending segment. SOFT TISSUES: Vascular calcifications are noted in the pelvis. The atrophic renal shadows are not well seen. BONES: No acute fracture. There is degenerative change of the lumbar spine and hips. There is no supine evidence of free air. IMPRESSION: 1. Single mildly dilated small bowel segment in the right mid abdomen, which may reflect focal ileus or early small bowel obstruction. 2. No supine evidence of free air. Electronically signed by: Francis Quam MD 10/18/2024 07:00 AM EST RP Workstation: [id number]    Medications:  cefTRIAXone  (ROCEPHIN )  IV 2 g (10/19/24 0515)    (feeding supplement) PROSource Plus  30 mL Oral BID BM   atorvastatin   40 mg Oral Daily   carvedilol   3.125 mg Oral BID WC   Chlorhexidine  Gluconate Cloth  6 each Topical Q0600   ezetimibe   10 mg Oral Daily   hydrALAZINE   50 mg Oral Q8H   isosorbide  mononitrate  30 mg Oral Daily   pantoprazole  (PROTONIX ) IV  40 mg Intravenous Q12H   pneumococcal 20-valent conjugate vaccine  0.5 mL Intramuscular Tomorrow-1000   sacubitril -valsartan   1 tablet Oral BID    Dialysis Orders: [facility] - MWF 3:15hr, 400/800, EDW 65 kg, TDC, 3K/2.5 calcium .  Heparin : 800 units bolus, 400 units hourly.  Meds: Mircera 150 mcg every 2 weeks (last dose as an outpatient 12/29), Venofer 50 mg q. Weekly  Assessment/Plan: CAD, severe multivessel disease: Not a candidate for CABG, on medical therapy. Biventricular HF: With volume on imaging/exam. Trying serial HD but with very long HD census in-house. On Entresto , hydral/iso. S/p 1.9L paracentesis 1/5. ESRD: Usual MWF schedule. Off schedule this week - s/p HD 1/4 and 1/6. Will move back to usual schedule as our census allows. Next HD 1/8 HTN/volume: BP  good,may have to scale back on something to allow UF - see how goes with HD. 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 Not due for ESA yet. See #6. Hematemesis: ?Variceal bleed. Known cirrhosis. GI consulted, for EGD tomorrow. On PPI + octeotride + empiric Ceftriaxone . Eliquis  on hold. Secondary HPTH: Ca/Phos ok - continue home meds Nutrition: Alb low, continue supps. Pt desiring heart-healthy diet rather than renal diet, ok for now - watch labs.  Charmaine Piety, NP [facility] 10/19/2024,1:29 PM  LOS: 6 days    "

## 2024-10-19 NOTE — Assessment & Plan Note (Addendum)
 Continue carvedilol  and entresto   Afterload reduction with hydralazine  and isosorbide 

## 2024-10-19 NOTE — Anesthesia Preprocedure Evaluation (Addendum)
 "                                  Anesthesia Evaluation  Patient identified by MRN, date of birth, ID band Patient awake    Reviewed: Allergy & Precautions, NPO status , Patient's Chart, lab work & pertinent test results  History of Anesthesia Complications Negative for: history of anesthetic complications  Airway Mallampati: III  TM Distance: >3 FB Neck ROM: Full    Dental  (+) Dental Advisory Given   Pulmonary neg shortness of breath, neg sleep apnea, neg COPD, neg recent URI, former smoker   Pulmonary exam normal breath sounds clear to auscultation       Cardiovascular hypertension (amlodipine , hydralazine , irbesartan , metoprolol , Entresto ), Pt. on medications (-) angina + CAD and +CHF  (-) Past MI, (-) Cardiac Stents and (-) CABG + dysrhythmias Atrial Fibrillation  Rhythm:Regular Rate:Normal  TTE 10/15/2024: IMPRESSIONS    1. Left ventricular ejection fraction, by estimation, is 40 to 45%. The  left ventricle has mildly decreased function. The left ventricle  demonstrates global hypokinesis. There is the interventricular septum is  flattened in diastole ('D' shaped left  ventricle), consistent with right ventricular volume overload.   2. Right ventricular systolic function is moderately reduced. The right  ventricular size is severely enlarged. Tricuspid regurgitation signal is  inadequate for assessing PA pressure.   3. The mitral valve is degenerative. Trivial mitral valve regurgitation.  No evidence of mitral stenosis. Moderate mitral annular calcification.   4. The aortic valve is calcified. There is severe calcifcation of the  aortic valve. There is severe thickening of the aortic valve. Aortic valve  regurgitation is not visualized. Aortic valve sclerosis/calcification is  present, without any evidence of  aortic stenosis.   5. The inferior vena cava is dilated in size with <50% respiratory  variability, suggesting right atrial pressure of 15 mmHg.    Heart Cath 10/11/2024: Conclusions: 1. Severe LMCA and LAD disease with heavy calcification of the coronary arteries, as detailed below. 2. Severely elevated left heart, right heart, and pulmonary artery pressures.  Equalization of end-diastolic pressures noted, which can be seen with restrictive/constrictive physiology. 3. Normal Fick cardiac output/index.     Neuro/Psych negative neurological ROS     GI/Hepatic ,GERD  Medicated,,(+) Cirrhosis   ascites  substance abuse  cocaine  use, Hepatitis -, C  Endo/Other  diabetes, Type 2  Secondary hyperparathyroidism  Renal/GU ESRF and DialysisRenal disease   BPH    Musculoskeletal   Abdominal   Peds  Hematology  (+) Blood dyscrasia, anemia Lab Results      Component                Value               Date                      WBC                      10.5                10/19/2024                HGB                      8.4 (L)  10/19/2024                HCT                      25.8 (L)            10/19/2024                MCV                      90.8                10/19/2024                PLT                      167                 10/19/2024              Anesthesia Other Findings Hematemesis x1 >24 hours ago   Reproductive/Obstetrics                              Anesthesia Physical Anesthesia Plan  ASA: 4  Anesthesia Plan: MAC   Post-op Pain Management: Minimal or no pain anticipated   Induction: Intravenous  PONV Risk Score and Plan: 1 and Propofol  infusion, TIVA and Treatment may vary due to age or medical condition  Airway Management Planned: Natural Airway and Nasal Cannula  Additional Equipment:   Intra-op Plan:   Post-operative Plan:   Informed Consent: I have reviewed the patients History and Physical, chart, labs and discussed the procedure including the risks, benefits and alternatives for the proposed anesthesia with the patient or authorized  representative who has indicated his/her understanding and acceptance.     Dental advisory given  Plan Discussed with: CRNA and Anesthesiologist  Anesthesia Plan Comments: (Discussed with patient risks of MAC including, but not limited to, minor pain or discomfort, hearing people in the room, and possible need for backup general anesthesia. Risks for general anesthesia also discussed including, but not limited to, sore throat, hoarse voice, chipped/damaged teeth, injury to vocal cords, nausea and vomiting, allergic reactions, lung infection, heart attack, stroke, and death. All questions answered. )         Anesthesia Quick Evaluation  "

## 2024-10-19 NOTE — Assessment & Plan Note (Addendum)
 Hyperkalemia hyponatremia.   Pre HD K 4,7 BUN 62 and Na 137 bicarbonate 23   Continue renal replacement therapy per nephrology recommendations. having HD today

## 2024-10-19 NOTE — Hospital Course (Addendum)
 Mr. Donaway was admitted to the hospital with the working diagnosis of unstable coronary artery disease.   64 y.o. male with a PMH significant for chronic HFrEF with LVEF 50% on most recent stress test, ESRD on HD MWF, HTN, HLD, PAF on Eliquis , hepatitis C and liver cirrhosis, presented with worsening of exertional dyspnea. Underwent stress test recently which showed a moderate size severe partially reversible defect involving the anterior wall apex and apical inferior segment consistent with scar and peri-infarct ischemia. LVEF 51%. Presented to [facility] for elective LHC and RHC. Cath found L main stenosis.  Patient was subsequently transferred to [facility] for cardiothoracic surgery input.  01/06 patient had episode of hematemesis, patient was placed on pantoprazole  and octreotide  01/07 EGD with grade I esophageal varices with no stigmata of bleeding. Superficial mucosal ter at the gastroesophageal junction with small protuberant vessel, clip placed.  01/08 positive hematochezia  01/09 no further hematochezia and hgb stable, resuming anticoagulation with heparin  IV  01/10 patient having mild hematochezia on IV heparin 

## 2024-10-20 DIAGNOSIS — I480 Paroxysmal atrial fibrillation: Secondary | ICD-10-CM | POA: Diagnosis not present

## 2024-10-20 DIAGNOSIS — I5023 Acute on chronic systolic (congestive) heart failure: Secondary | ICD-10-CM | POA: Diagnosis not present

## 2024-10-20 DIAGNOSIS — I2510 Atherosclerotic heart disease of native coronary artery without angina pectoris: Secondary | ICD-10-CM | POA: Diagnosis not present

## 2024-10-20 DIAGNOSIS — N186 End stage renal disease: Secondary | ICD-10-CM | POA: Diagnosis not present

## 2024-10-20 LAB — CBC
HCT: 25.2 % — ABNORMAL LOW (ref 39.0–52.0)
Hemoglobin: 8 g/dL — ABNORMAL LOW (ref 13.0–17.0)
MCH: 29.3 pg (ref 26.0–34.0)
MCHC: 31.7 g/dL (ref 30.0–36.0)
MCV: 92.3 fL (ref 80.0–100.0)
Platelets: 163 K/uL (ref 150–400)
RBC: 2.73 MIL/uL — ABNORMAL LOW (ref 4.22–5.81)
RDW: 16.5 % — ABNORMAL HIGH (ref 11.5–15.5)
WBC: 9.3 K/uL (ref 4.0–10.5)
nRBC: 0 % (ref 0.0–0.2)

## 2024-10-20 LAB — RENAL FUNCTION PANEL
Albumin: 2.8 g/dL — ABNORMAL LOW (ref 3.5–5.0)
Anion gap: 14 (ref 5–15)
BUN: 53 mg/dL — ABNORMAL HIGH (ref 8–23)
CO2: 24 mmol/L (ref 22–32)
Calcium: 8.4 mg/dL — ABNORMAL LOW (ref 8.9–10.3)
Chloride: 96 mmol/L — ABNORMAL LOW (ref 98–111)
Creatinine, Ser: 6.44 mg/dL — ABNORMAL HIGH (ref 0.61–1.24)
GFR, Estimated: 9 mL/min — ABNORMAL LOW
Glucose, Bld: 144 mg/dL — ABNORMAL HIGH (ref 70–99)
Phosphorus: 4 mg/dL (ref 2.5–4.6)
Potassium: 4.5 mmol/L (ref 3.5–5.1)
Sodium: 134 mmol/L — ABNORMAL LOW (ref 135–145)

## 2024-10-20 LAB — SURGICAL PATHOLOGY

## 2024-10-20 LAB — HEMOGLOBIN AND HEMATOCRIT, BLOOD
HCT: 24.9 % — ABNORMAL LOW (ref 39.0–52.0)
Hemoglobin: 8 g/dL — ABNORMAL LOW (ref 13.0–17.0)

## 2024-10-20 NOTE — Progress Notes (Signed)
 RN paged Dr. Charlton d/t patient stating he had a bloody bm. Pt removed hat and flushed before RN was able to see stool. RN educated patient on importance of having the hat in toilet. Pt to notify RN for anymore bloody stool. Provider aware.

## 2024-10-20 NOTE — Care Management Important Message (Signed)
 Important Message  Patient Details  Name: Casey Franco MRN: [id number] Date of Birth: July 04, 1961   Important Message Given:  Yes - Medicare IM     Casey Franco 10/20/2024, 10:41 AM

## 2024-10-20 NOTE — Progress Notes (Addendum)
 "  [facility] Note   Subjective:    Seen and examined patient at bedside. Appears more sleepy compared to yesterday. Per bedside RN, he had an early breakfast. Plan for HD this afternoon.  Objective Vitals:   10/20/24 0050 10/20/24 0420 10/20/24 0601 10/20/24 0833  BP: 112/74 114/69  120/70  Pulse: 87 87  84  Resp: 17   20  Temp: 99 F (37.2 C) 99.2 F (37.3 C)  98.2 F (36.8 C)  TempSrc: Oral Oral  Oral  SpO2: 94% 96%  95%  Weight:   61.9 kg   Height:       Physical Exam General: Well appearing, NAD. Room air Heart: RRR Lungs: CTA upper lobes, bibasilar rales Abdomen: soft, less distended Extremities: no LE edema Dialysis Access: [facility]  Filed Weights   10/18/24 1152 10/18/24 1555 10/20/24 0601  Weight: 66 kg 63 kg 61.9 kg    Intake/Output Summary (Last 24 hours) at 10/20/2024 1131 Last data filed at 10/20/2024 0750 Gross per 24 hour  Intake 0 ml  Output --  Net 0 ml    Additional Objective Labs: Basic Metabolic Panel: Recent Labs  Lab 10/16/24 0446 10/18/24 0700 10/18/24 1218 10/19/24 0804 10/20/24 0525  NA 134* 132* 133* 134* 134*  K 3.8 4.8 5.6* 4.7 4.5  CL 98 96* 96* 97* 96*  CO2 25 20* 20*  --  24  GLUCOSE 100* 83 66* 87 144*  BUN 38* 66* 70* 35* 53*  CREATININE 5.37* 8.16* 8.39* 5.60* 6.44*  CALCIUM  8.7* 8.8* 8.6*  --  8.4*  PHOS 3.9  --  4.8*  --  4.0   Liver Function Tests: Recent Labs  Lab 10/14/24 1044 10/15/24 0954 10/18/24 0700 10/18/24 1218 10/20/24 0525  AST 19  --  18  --   --   ALT 8  --  7  --   --   ALKPHOS 141*  --  117  --   --   BILITOT 0.6  --  0.7  --   --   PROT 7.7  --  7.1  --   --   ALBUMIN  3.3*   < > 2.9* 2.8* 2.8*   < > = values in this interval not displayed.   No results for input(s): LIPASE, AMYLASE in the last 168 hours. CBC: Recent Labs  Lab 10/14/24 1044 10/14/24 1933 10/16/24 0446 10/17/24 0517 10/18/24 0432 10/18/24 1944 10/19/24 0439 10/19/24 0804 10/20/24 0525  WBC  6.9   < > 8.1 10.0 13.4*  --  10.5  --  9.3  NEUTROABS 5.0  --   --   --   --   --   --   --   --   HGB 9.3*   < > 8.8* 8.9* 9.2*   < > 8.4* 8.2* 8.0*  HCT 28.4*   < > 27.1* 27.5* 27.6*   < > 25.8* 24.0* 25.2*  MCV 90.7   < > 91.9 92.9 90.2  --  90.8  --  92.3  PLT 172   < > 168 167 158  --  167  --  163   < > = values in this interval not displayed.   Blood Culture    Component Value Date/Time   SDES BLOOD SITE NOT SPECIFIED 10/18/2024 0701   SPECREQUEST  10/18/2024 0701    BOTTLES DRAWN AEROBIC AND ANAEROBIC Blood Culture adequate volume   CULT  10/18/2024 0701    NO GROWTH 2 DAYS  Performed at [facility], [address], [place], [place] [address]    REPTSTATUS PENDING 10/18/2024 0701    Cardiac Enzymes: No results for input(s): CKTOTAL, CKMB, CKMBINDEX, TROPONINI in the last 168 hours. CBG: No results for input(s): GLUCAP in the last 168 hours. Iron Studies: No results for input(s): IRON, TIBC, TRANSFERRIN, FERRITIN in the last 72 hours. Lab Results  Component Value Date   INR 1.3 (H) 05/23/2024   INR 1.2 12/31/2023   INR 1.9 (H) 11/26/2023   Studies/Results: No results found.  Medications:  cefTRIAXone  (ROCEPHIN )  IV 2 g (10/20/24 0625)    (feeding supplement) PROSource Plus  30 mL Oral BID BM   atorvastatin   40 mg Oral Daily   carvedilol   3.125 mg Oral BID WC   Chlorhexidine  Gluconate Cloth  6 each Topical Q0600   ezetimibe   10 mg Oral Daily   hydrALAZINE   50 mg Oral Q8H   isosorbide  mononitrate  30 mg Oral Daily   pantoprazole   40 mg Oral BID   pneumococcal 20-valent conjugate vaccine  0.5 mL Intramuscular Tomorrow-1000   sacubitril -valsartan   1 tablet Oral BID    Dialysis Orders: [facility] - MWF 3:15hr, 400/800, EDW 65 kg, TDC, 3K/2.5 calcium .  Heparin : 800 units bolus, 400 units hourly.  Meds: Mircera 150 mcg every 2 weeks (last dose as an outpatient 12/29), Venofer 50 mg q. Weekly  Assessment/Plan: CAD, severe  multivessel disease: Not a candidate for CABG, on medical therapy. Biventricular HF: With volume on imaging/exam. Trying serial HD but with very long HD census in-house. On Entresto , hydral/iso. S/p 1.9L paracentesis 1/5. ESRD: Usual MWF schedule. Off schedule this week - s/p HD 1/4 and 1/6. Will move back to usual schedule as our census allows. Next HD 1/8 HTN/volume: BP good,may have to scale back on something to allow UF - see how goes with HD. 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 Not due for ESA yet. See #6. Hematemesis: ?Variceal bleed. Known cirrhosis. GI consulted, s/p EGD 1/7: grade 1 varices was found. On PPI + octeotride + empiric Ceftriaxone . Eliquis  on hold. Secondary HPTH: Ca/Phos ok - continue home meds Nutrition: Alb low, continue supps. Pt desiring heart-healthy diet rather than renal diet, ok for now - watch labs-current K+ is 4.5.  Charmaine Piety, NP [facility] 10/20/2024,11:31 AM  LOS: 7 days    "

## 2024-10-20 NOTE — Plan of Care (Signed)
   Problem: Health Behavior/Discharge Planning: Goal: Ability to manage health-related needs will improve Outcome: Progressing

## 2024-10-20 NOTE — Progress Notes (Signed)
 PT Cancellation Note  Patient Details Name: Casey Franco MRN: [id number] DOB: February 01, 1961   Cancelled Treatment:    Reason Eval/Treat Not Completed: PT screened, no needs identified, will sign off. Pt feels he is at his baseline and has no DME needs at this time. Pt was encouraged to reach out if he feels there is a change in his overall mobility or status. Therapist encouraged pt to continue to mobilize frequently. No further PT needs at this time. PT signing off.  Casey Franco DPT [facility] [phone] Prefer contact via chat    Casey Franco 10/20/2024, 10:03 AM

## 2024-10-20 NOTE — Progress Notes (Signed)
 OT Cancellation Note  Patient Details Name: Casey Franco MRN: [id number] DOB: 08-02-1961   Cancelled Treatment:    Reason Eval/Treat Not Completed: (P) OT screened, no needs identified, will sign off. Pt declines therapy, states he is independent, did not observe Pt OOB but per RN Pt has been completing ADLs and mobility independently. Pt denies need for DME. Will sign off.   Elouise JONELLE Bott 10/20/2024, 11:07 AM

## 2024-10-20 NOTE — Progress Notes (Signed)
 Called to get patient to dialysis, patient refused

## 2024-10-20 NOTE — Progress Notes (Signed)
 " Progress Note   Patient: Casey Franco FMW:[id number] DOB: 1961-06-28 DOA: 10/13/2024     7 DOS: the patient was seen and examined on 10/20/2024   Brief hospital course: Mr. Cuny was admitted to the hospital with the working diagnosis of unstable coronary artery disease.   64 y.o. male with a PMH significant for chronic HFrEF with LVEF 50% on most recent stress test, ESRD on HD MWF, HTN, HLD, PAF on Eliquis , hepatitis C and liver cirrhosis, presented with worsening of exertional dyspnea. Underwent stress test recently which showed a moderate size severe partially reversible defect involving the anterior wall apex and apical inferior segment consistent with scar and peri-infarct ischemia. LVEF 51%. Presented to [facility] for elective LHC and RHC. Cath found L main stenosis.  Patient was subsequently transferred to [facility] for cardiothoracic surgery input.  01/06 patient had episode of hematemesis, patient was placed on pantoprazole  and octreotide  01/07 EGD with grade I esophageal varices with no stigmata of bleeding. Superficial mucosal ter at the gastroesophageal junction with small protuberant vessel, clip placed.  01/08 positive hematochezia   Assessment and Plan: * CAD (coronary artery disease) 10/11/24 cardiac catheterization with 75% left main stenosis, 76% ostial LAD, occluded D2 and 99% m LAD.  Patient not candidate for PCI or CABG.   Plan to continue statin and ezetimibe .  Holding anticoagulation until resolution of GI bleeding   Acute on chronic systolic heart failure (HCC) Echocardiogram with reduced LV systolic function 40 to 45%,  global hypokinesis, interventricular septum flattened in diastole, RV systolic function with moderate reduction, RV with severe enlargement.   Volume status improved with ultrafiltration on HD  Continue with entresto  and carvedilol  Afterload reduction with hydralazine  and isosorbide    Paroxysmal atrial fibrillation (HCC) Continue rate  control with carvedilol   Continue telemetry monitoring  Holding on anticoagulation due to risk of bleeding   Essential hypertension Continue blood pressure monitoring Continue carvedilol  and entresto   Afterload reduction with hydralazine  and isosorbide    End-stage renal disease on hemodialysis (HCC) Hyperkalemia hyponatremia.   K 4,5 with Na 134, bicarbonate at 24 and BUN 53   Continue renal replacement therapy per nephrology recommendations. Plan for HD today  Other cirrhosis of liver (HCC) Portal hypertension and class I esophageal varices.  Acute upper GI bleed with acute blood loss anemia.  EGD with no active bleeding  Plan to continue pantoprazole   Off octreotide   Follow up hgb is 8,0, positive hematochezia.  Follow up H&H at 1600 hr today  Continue to hold on anticoagulation  Continue prophylactic antibiotic for SBP   Type II diabetes mellitus with renal manifestations (HCC) Continue glucose cover and monitoring with insulin  sliding scale   GERD (gastroesophageal reflux disease) Continue pantoprazole          Subjective: Patient with positive hematochezia today with no abdominal pain, no nausea or vomiting. No chest pain, mild abdominal distention. No diarrhea or constipation   Physical Exam: Vitals:   10/20/24 0050 10/20/24 0420 10/20/24 0601 10/20/24 0833  BP: 112/74 114/69  120/70  Pulse: 87 87  84  Resp: 17   20  Temp: 99 F (37.2 C) 99.2 F (37.3 C)  98.2 F (36.8 C)  TempSrc: Oral Oral  Oral  SpO2: 94% 96%  95%  Weight:   61.9 kg   Height:       Neurology awake and alert ENT with mild pallor with no icterus Cardiovascular with S1 and S2 present, irregularly irregular with no gallops or rubs Respiratory  with no rales or wheezing, no rhonchi,  Abdomen with mild distention, tympanic to percussion and tender to palpation with no rebound or guarding  No lower extremity edema   Data Reviewed:    Family Communication: no family at the bedside    Disposition: Status is: Inpatient Remains inpatient appropriate because: monitor for bleeding   Planned Discharge Destination: Home    Author: Elidia Toribio Furnace, MD 10/20/2024 9:02 AM  For on call review [email or web address].  "

## 2024-10-20 NOTE — Progress Notes (Signed)
 Pt scheduled for hemodialysis this evening. Pt refused. Pt is alert and oriented x 4, able to verbalize understanding of the procedure and consequences of refusal. Pt stated,  I will do it tomorrow, not tonight. Education provided regarding the importance of hemodialysis; pt continued to refuse at this time. Dr. Charlton notified. Will continue to monitor.

## 2024-10-21 LAB — BASIC METABOLIC PANEL WITH GFR
Anion gap: 16 — ABNORMAL HIGH (ref 5–15)
BUN: 61 mg/dL — ABNORMAL HIGH (ref 8–23)
CO2: 23 mmol/L (ref 22–32)
Calcium: 8.3 mg/dL — ABNORMAL LOW (ref 8.9–10.3)
Chloride: 98 mmol/L (ref 98–111)
Creatinine, Ser: 7.81 mg/dL — ABNORMAL HIGH (ref 0.61–1.24)
GFR, Estimated: 7 mL/min — ABNORMAL LOW
Glucose, Bld: 116 mg/dL — ABNORMAL HIGH (ref 70–99)
Potassium: 4.7 mmol/L (ref 3.5–5.1)
Sodium: 137 mmol/L (ref 135–145)

## 2024-10-21 LAB — APTT: aPTT: 79 s — ABNORMAL HIGH (ref 24–36)

## 2024-10-21 LAB — CBC
HCT: 28.1 % — ABNORMAL LOW (ref 39.0–52.0)
Hemoglobin: 8.9 g/dL — ABNORMAL LOW (ref 13.0–17.0)
MCH: 29.1 pg (ref 26.0–34.0)
MCHC: 31.7 g/dL (ref 30.0–36.0)
MCV: 91.8 fL (ref 80.0–100.0)
Platelets: 191 K/uL (ref 150–400)
RBC: 3.06 MIL/uL — ABNORMAL LOW (ref 4.22–5.81)
RDW: 16.3 % — ABNORMAL HIGH (ref 11.5–15.5)
WBC: 8 K/uL (ref 4.0–10.5)
nRBC: 0 % (ref 0.0–0.2)

## 2024-10-21 LAB — HEPARIN LEVEL (UNFRACTIONATED): Heparin Unfractionated: <0.1 [IU]/mL — ABNORMAL LOW (ref 0.30–0.70)

## 2024-10-21 MED ORDER — ALTEPLASE 2 MG IJ SOLR: 2.0000 mg | Freq: Once | INTRAMUSCULAR | Status: DC | PRN

## 2024-10-21 MED ORDER — HEPARIN SODIUM (PORCINE) 1000 UNIT/ML DIALYSIS: 1000.0000 [IU] | INTRAMUSCULAR | Status: DC | PRN

## 2024-10-21 MED ORDER — HEPARIN SODIUM (PORCINE) 1000 UNIT/ML IJ SOLN
INTRAMUSCULAR | Status: AC
  Filled 2024-10-21: qty 4

## 2024-10-21 MED ORDER — HEPARIN (PORCINE) 25000 UT/250ML-% IV SOLN
2050.0000 [IU]/h | INTRAVENOUS | Status: DC
  Administered 2024-10-21: 1500 [IU]/h via INTRAVENOUS
  Administered 2024-10-22: 2000 [IU]/h via INTRAVENOUS
  Administered 2024-10-22: 1700 [IU]/h via INTRAVENOUS
  Administered 2024-10-23: 2050 [IU]/h via INTRAVENOUS
  Filled 2024-10-21 (×4): qty 250

## 2024-10-21 NOTE — Progress Notes (Signed)
 " [facility] Progress Note   Subjective:  Seen on HD, 1.5L UFG and tolerating so far. No CP/dyspnea. Mild abd pain, no further bleeding to his knowledge.  Objective Vitals:   10/21/24 0438 10/21/24 0808 10/21/24 0825 10/21/24 0830  BP:  137/81 133/79 133/79  Pulse:  84 81 82  Resp:  14 16 15   Temp:  97.7 F (36.5 C)    TempSrc:      SpO2:  98% 97% 97%  Weight: 63.3 kg 66.9 kg    Height:       Physical Exam General: Well appearing, NAD. Room air Heart: RRR Lungs: CTA anteriorly Abdomen: soft, mild TTP Extremities: no LE edema Dialysis Access:  [facility]  Additional Objective Labs: Basic Metabolic Panel: Recent Labs  Lab 10/16/24 0446 10/18/24 0700 10/18/24 1218 10/19/24 0804 10/20/24 0525 10/21/24 0517  NA 134*   < > 133* 134* 134* 137  K 3.8   < > 5.6* 4.7 4.5 4.7  CL 98   < > 96* 97* 96* 98  CO2 25   < > 20*  --  24 23  GLUCOSE 100*   < > 66* 87 144* 116*  BUN 38*   < > 70* 35* 53* 61*  CREATININE 5.37*   < > 8.39* 5.60* 6.44* 7.81*  CALCIUM  8.7*   < > 8.6*  --  8.4* 8.3*  PHOS 3.9  --  4.8*  --  4.0  --    < > = values in this interval not displayed.   Liver Function Tests: Recent Labs  Lab 10/14/24 1044 10/15/24 0954 10/18/24 0700 10/18/24 1218 10/20/24 0525  AST 19  --  18  --   --   ALT 8  --  7  --   --   ALKPHOS 141*  --  117  --   --   BILITOT 0.6  --  0.7  --   --   PROT 7.7  --  7.1  --   --   ALBUMIN  3.3*   < > 2.9* 2.8* 2.8*   < > = values in this interval not displayed.   CBC: Recent Labs  Lab 10/14/24 1044 10/14/24 1933 10/17/24 0517 10/18/24 0432 10/18/24 1944 10/19/24 0439 10/19/24 0804 10/20/24 0525 10/20/24 2029 10/21/24 0517  WBC 6.9   < > 10.0 13.4*  --  10.5  --  9.3  --  8.0  NEUTROABS 5.0  --   --   --   --   --   --   --   --   --   HGB 9.3*   < > 8.9* 9.2*   < > 8.4*   < > 8.0* 8.0* 8.9*  HCT 28.4*   < > 27.5* 27.6*   < > 25.8*   < > 25.2* 24.9* 28.1*  MCV 90.7   < > 92.9 90.2  --  90.8  --  92.3  --   91.8  PLT 172   < > 167 158  --  167  --  163  --  191   < > = values in this interval not displayed.   Medications:  cefTRIAXone  (ROCEPHIN )  IV 2 g (10/21/24 0600)    (feeding supplement) PROSource Plus  30 mL Oral BID BM   atorvastatin   40 mg Oral Daily   carvedilol   3.125 mg Oral BID WC   Chlorhexidine  Gluconate Cloth  6 each Topical Q0600   ezetimibe   10 mg  Oral Daily   hydrALAZINE   50 mg Oral Q8H   isosorbide  mononitrate  30 mg Oral Daily   pantoprazole   40 mg Oral BID   pneumococcal 20-valent conjugate vaccine  0.5 mL Intramuscular Tomorrow-1000   sacubitril -valsartan   1 tablet Oral BID    Dialysis Orders [facility] - MWF 3:15hr, 400/800, EDW 65 kg, TDC, 3K/2.5 calcium .  Heparin : 800 units bolus, 400 units hourly.  Meds: Mircera 150 mcg every 2 weeks (last dose as an outpatient 12/29), Venofer 50 mg q. Weekly   Assessment/Plan: CAD, severe multivessel disease: Not a candidate for CABG, on medical therapy. Biventricular HF: With volume on imaging/exam. Trying serial HD but with very long HD census in-house. On Entresto , hydral/iso. S/p 1.9L paracentesis 1/5. ESRD: Usual MWF schedule. Off schedule this week - s/p HD 1/4 and 1/6. HD now - back on usual schedule. HTN/volume: BP holding, no LE edema on exam. Anemia of ESRD: Hgb 8.9. Not due for ESA yet. See #6. Hematemesis: Known cirrhosis. GI consulted, s/p EGD 1/7: grade 1 varices was found. On PPI and empiric Ceftriaxone , off octreotide . Eliquis  remains on hold. Secondary HPTH: Ca/Phos ok - continue home meds Nutrition: Alb low, continue supps. Pt desiring heart-healthy diet rather than renal diet, ok for now - watch labs. pA-fib: Off Eliquis  at this time.   Izetta Boehringer, PA-C 10/21/2024, 8:38 AM  [facility]    "

## 2024-10-21 NOTE — Procedures (Signed)
 HD Note:  Some information was entered later than the data was gathered due to patient care needs. The stated time with the data is accurate.  Received patient in bed to unit.   Alert and oriented.   Informed consent signed and in chart.   Access used: Upper right chest HD catheter Access issues:  None  Patient tolerated treatment well.   TX duration: 3.5 hours   Alert, without acute distress.  Total UF removed: 1500 ml  Hand-off given to patient's nurse.   Transported back to the room   Suzanna Zahn L. Lenon, RN [facility].

## 2024-10-21 NOTE — Progress Notes (Signed)
 PHARMACY - ANTICOAGULATION CONSULT NOTE  Pharmacy Consult for IV heparin   Indication: atrial fibrillation  Allergies[1]  Patient Measurements: Height: 6' (182.9 cm) Weight: 66.9 kg (147 lb 7.8 oz) IBW/kg (Calculated) : 77.6  Vital Signs: Temp: 99 F (37.2 C) (01/09 2015) Temp Source: Oral (01/09 2015) BP: 102/68 (01/09 2107) Pulse Rate: 87 (01/09 2107)  Labs: Recent Labs    10/19/24 0439 10/19/24 0804 10/20/24 0525 10/20/24 2029 10/21/24 0517 10/21/24 2152  HGB 8.4* 8.2* 8.0* 8.0* 8.9*  --   HCT 25.8* 24.0* 25.2* 24.9* 28.1*  --   PLT 167  --  163  --  191  --   APTT  --   --   --   --   --  79*  HEPARINUNFRC  --   --   --   --   --  <0.10*  CREATININE  --  5.60* 6.44*  --  7.81*  --     Estimated Creatinine Clearance: 9 mL/min (A) (by C-G formula based on SCr of 7.81 mg/dL (H)).   Medical History: Past Medical History:  Diagnosis Date   AKI (acute kidney injury) 08/30/2018   Anemia of chronic disease    Chronic systolic heart failure (HCC)    Diabetes mellitus without complication (HCC)    no meds since weight loss   Dialysis patient    Mon, Wed, Fri   ESRD (end stage renal disease) (HCC)    on HD (M,W,F)   Hyperkalemia 02/04/2021   Hypertension    Metabolic acidosis, increased anion gap 02/04/2021   Paroxysmal atrial fibrillation (HCC)    Renal disorder    Sepsis without acute organ dysfunction (HCC) 11/30/2023    Assessment: 64 yo male w/ hx afib on Eliquis  recently increased to 5 mg bid due to stroke risk (CHA2DS2/VAS of 3) but previously 2.5 mg bid subtherapeutic dose for cirrhosis/ESRD increased bleeding risk.  Pharmacy consulted for heparin  bridging.   Heparin  level <0.10 -   Goal of Therapy:  Heparin  level 0.3-0.5 Monitor platelets by anticoagulation protocol: Yes   Plan:  Increase  IV heparin  to 1700 units/hr (previously required higher dosing for therapeutic levels but starting conservatively given bleeding). Check heparin  level and aPTT  in 8 hrs. Daily heparin  level, CBC and APTT. F/u for bleeding and ability to resume Eliquis .  Harlene Boga, PharmD Please refer to [facility] for [facility] Pharmacy numbers  10/21/2024 11:02 PM        [1] No Known Allergies

## 2024-10-21 NOTE — Progress Notes (Signed)
 " Progress Note   Patient: Casey Franco FMW:[id number] DOB: [birth year] DOA: 10/13/2024     8 DOS: the patient was seen and examined on 10/21/2024   Brief hospital course: Mr. Mangold was admitted to the hospital with the working diagnosis of unstable coronary artery disease.   64 y.o. male with a PMH significant for chronic HFrEF with LVEF 50% on most recent stress test, ESRD on HD MWF, HTN, HLD, PAF on Eliquis , hepatitis C and liver cirrhosis, presented with worsening of exertional dyspnea. Underwent stress test recently which showed a moderate size severe partially reversible defect involving the anterior wall apex and apical inferior segment consistent with scar and peri-infarct ischemia. LVEF 51%. Presented to [facility] for elective LHC and RHC. Cath found L main stenosis.  Patient was subsequently transferred to [facility] for cardiothoracic surgery input.  01/06 patient had episode of hematemesis, patient was placed on pantoprazole  and octreotide  01/07 EGD with grade I esophageal varices with no stigmata of bleeding. Superficial mucosal ter at the gastroesophageal junction with small protuberant vessel, clip placed.  01/08 positive hematochezia  01/09 no further hematochezia and hgb stable, resuming anticoagulation with heparin  IV   Assessment and Plan: * CAD (coronary artery disease) 10/11/24 cardiac catheterization with 75% left main stenosis, 76% ostial LAD, occluded D2 and 99% m LAD.  Patient not candidate for PCI or CABG.   Plan to continue statin and ezetimibe .  Plan to resume anticoagulation with iV heparin    Acute on chronic systolic heart failure (HCC) Echocardiogram with reduced LV systolic function 40 to 45%,  global hypokinesis, interventricular septum flattened in diastole, RV systolic function with moderate reduction, RV with severe enlargement.   Volume status improved with ultrafiltration on HD  Continue with entresto  and carvedilol  Afterload reduction with  hydralazine  and isosorbide    Paroxysmal atrial fibrillation (HCC) Continue rate control with carvedilol   Continue telemetry monitoring  Resume anticoagulation with heparin  IV if no recurrent bleeding will resume apixaban  in 24 hrs   Essential hypertension Continue blood pressure monitoring Continue carvedilol  and entresto   Afterload reduction with hydralazine  and isosorbide    End-stage renal disease on hemodialysis (HCC) Hyperkalemia hyponatremia.   Pre HD K 4,7 BUN 62 and Na 137 bicarbonate 23   Continue renal replacement therapy per nephrology recommendations. having HD today  Other cirrhosis of liver (HCC) Portal hypertension and class I esophageal varices.  Acute upper GI bleed with acute blood loss anemia.  EGD with no active bleeding  Plan to continue pantoprazole   Off octreotide   Follow up hgb is 8,9 with no further hematochezia.  Follow up H&H in am  Continue to hold on anticoagulation  Continue prophylactic antibiotic for SBP #4/7  Type II diabetes mellitus with renal manifestations (HCC) Continue glucose cover and monitoring with insulin  sliding scale   GERD (gastroesophageal reflux disease) Continue pantoprazole        Subjective: Patient with no chest pain and no dyspnea, no hematochezia or hematemesis, no melena,  Physical Exam: Vitals:   10/21/24 1112 10/21/24 1130 10/21/24 1158 10/21/24 1159  BP: 136/84 122/79 127/80 125/80  Pulse: 89 88 89 89  Resp: 19 (!) 21 (!) 21 19  Temp:    98.1 F (36.7 C)  TempSrc:      SpO2: 93% 97% 93% 92%  Weight:      Height:       Neurology awake and alert ENT with mild pallor with no icterus Cardiovascular with S1 and S2 present and regular with no gallops or  rubs Respiratory with no rales or wheezing, no rhonchi Abdomen with no distention  No lower extremity edema   Data Reviewed:    Family Communication: no family at the bedside   Disposition: Status is: Inpatient Remains inpatient appropriate  because: pending response to anticoagulation   Planned Discharge Destination: Home     Author: Elidia Toribio Furnace, MD 10/21/2024 12:35 PM  For on call review [email or web address].  "

## 2024-10-21 NOTE — Plan of Care (Signed)

## 2024-10-21 NOTE — Progress Notes (Signed)
 PHARMACY - ANTICOAGULATION CONSULT NOTE  Pharmacy Consult for IV heparin   Indication: atrial fibrillation  Allergies[1]  Patient Measurements: Height: 6' (182.9 cm) Weight: 66.9 kg (147 lb 7.8 oz) IBW/kg (Calculated) : 77.6  Vital Signs: Temp: 98.5 F (36.9 C) (01/09 1250) Temp Source: Oral (01/09 1250) BP: 134/84 (01/09 1250) Pulse Rate: 90 (01/09 1250)  Labs: Recent Labs    10/19/24 0439 10/19/24 0804 10/20/24 0525 10/20/24 2029 10/21/24 0517  HGB 8.4* 8.2* 8.0* 8.0* 8.9*  HCT 25.8* 24.0* 25.2* 24.9* 28.1*  PLT 167  --  163  --  191  CREATININE  --  5.60* 6.44*  --  7.81*    Estimated Creatinine Clearance: 9 mL/min (A) (by C-G formula based on SCr of 7.81 mg/dL (H)).   Medical History: Past Medical History:  Diagnosis Date   AKI (acute kidney injury) 08/30/2018   Anemia of chronic disease    Chronic systolic heart failure (HCC)    Diabetes mellitus without complication (HCC)    no meds since weight loss   Dialysis patient    Mon, Wed, Fri   ESRD (end stage renal disease) (HCC)    on HD (M,W,F)   Hyperkalemia 02/04/2021   Hypertension    Metabolic acidosis, increased anion gap 02/04/2021   Paroxysmal atrial fibrillation (HCC)    Renal disorder    Sepsis without acute organ dysfunction (HCC) 11/30/2023    Assessment: 64 yo male w/ hx afib on Eliquis  recently increased to 5 mg bid due to stroke risk (CHA2DS2/VAS of 3) but previously 2.5 mg bid subtherapeutic dose for cirrhosis/ESRD increased bleeding risk.  Pharmacy consulted for heparin  bridging.   Goal of Therapy:  Heparin  level 0.3-0.5 Monitor platelets by anticoagulation protocol: Yes   Plan:  Start IV heparin  without bolus at 1500 units/hr (previously required higher dosing for therapeutic levels but starting conservatively given bleeding). Check heparin  level and aPTT in 8 hrs. Daily heparin  level, CBC and APTT. F/u for bleeding and ability to resume Eliquis .  Harlene Barlow, Berdine BIRCH, BCPS,  BCCP Clinical Pharmacist  10/21/2024 1:09 PM   [facility] pharmacy phone numbers are listed on [email or web address]      [1] No Known Allergies

## 2024-10-22 DIAGNOSIS — K649 Unspecified hemorrhoids: Secondary | ICD-10-CM | POA: Insufficient documentation

## 2024-10-22 DIAGNOSIS — K921 Melena: Secondary | ICD-10-CM

## 2024-10-22 DIAGNOSIS — D638 Anemia in other chronic diseases classified elsewhere: Secondary | ICD-10-CM

## 2024-10-22 LAB — CBC
HCT: 25.7 % — ABNORMAL LOW (ref 39.0–52.0)
Hemoglobin: 8.3 g/dL — ABNORMAL LOW (ref 13.0–17.0)
MCH: 29.5 pg (ref 26.0–34.0)
MCHC: 32.3 g/dL (ref 30.0–36.0)
MCV: 91.5 fL (ref 80.0–100.0)
Platelets: 190 K/uL (ref 150–400)
RBC: 2.81 MIL/uL — ABNORMAL LOW (ref 4.22–5.81)
RDW: 16.1 % — ABNORMAL HIGH (ref 11.5–15.5)
WBC: 8.2 K/uL (ref 4.0–10.5)
nRBC: 0 % (ref 0.0–0.2)

## 2024-10-22 LAB — CULTURE, BODY FLUID W GRAM STAIN -BOTTLE: Culture: NO GROWTH

## 2024-10-22 LAB — HEPARIN LEVEL (UNFRACTIONATED)
Heparin Unfractionated: 0.12 [IU]/mL — ABNORMAL LOW (ref 0.30–0.70)
Heparin Unfractionated: 0.17 [IU]/mL — ABNORMAL LOW (ref 0.30–0.70)
Heparin Unfractionated: 0.15 [IU]/mL — ABNORMAL LOW (ref 0.30–0.70)

## 2024-10-22 MED ORDER — HYDROCORTISONE ACETATE 25 MG RE SUPP
25.0000 mg | Freq: Two times a day (BID) | RECTAL | Status: DC
  Filled 2024-10-22 (×3): qty 1

## 2024-10-22 NOTE — Progress Notes (Signed)
 " [facility] Progress Note   Subjective:   Patient seen and examined at bedside. Reports some BRBPR overnight but over all much improved from earlier this week.  Denies abdominal pain, dizziness, weakness, CP, SOB, edema and n/v/d.   Objective Vitals:   10/22/24 0540 10/22/24 0739 10/22/24 0921 10/22/24 1100  BP: 117/77  121/74 (!) 131/93  Pulse:   86 87  Resp: 13     Temp: 98.3 F (36.8 C) 97.9 F (36.6 C)  98 F (36.7 C)  TempSrc: Oral Oral  Oral  SpO2: 97%   100%  Weight:      Height:       Physical Exam General:well appearing male in NAD Heart:RRR, no mrg Lungs:CTAB, nml WOB on RA Abdomen:soft, NTND Extremities:no LE edema Dialysis Access: [facility]   Filed Weights   10/20/24 0601 10/21/24 0438 10/21/24 0808  Weight: 61.9 kg 63.3 kg 66.9 kg    Intake/Output Summary (Last 24 hours) at 10/22/2024 1320 Last data filed at 10/22/2024 0013 Gross per 24 hour  Intake 534.04 ml  Output --  Net 534.04 ml    Additional Objective Labs: Basic Metabolic Panel: Recent Labs  Lab 10/16/24 0446 10/18/24 0700 10/18/24 1218 10/19/24 0804 10/20/24 0525 10/21/24 0517  NA 134*   < > 133* 134* 134* 137  K 3.8   < > 5.6* 4.7 4.5 4.7  CL 98   < > 96* 97* 96* 98  CO2 25   < > 20*  --  24 23  GLUCOSE 100*   < > 66* 87 144* 116*  BUN 38*   < > 70* 35* 53* 61*  CREATININE 5.37*   < > 8.39* 5.60* 6.44* 7.81*  CALCIUM  8.7*   < > 8.6*  --  8.4* 8.3*  PHOS 3.9  --  4.8*  --  4.0  --    < > = values in this interval not displayed.   Liver Function Tests: Recent Labs  Lab 10/18/24 0700 10/18/24 1218 10/20/24 0525  AST 18  --   --   ALT 7  --   --   ALKPHOS 117  --   --   BILITOT 0.7  --   --   PROT 7.1  --   --   ALBUMIN  2.9* 2.8* 2.8*   CBC: Recent Labs  Lab 10/18/24 0432 10/18/24 1944 10/19/24 0439 10/19/24 0804 10/20/24 0525 10/20/24 2029 10/21/24 0517 10/22/24 0443  WBC 13.4*  --  10.5  --  9.3  --  8.0 8.2  HGB 9.2*   < > 8.4*   < > 8.0* 8.0* 8.9*  8.3*  HCT 27.6*   < > 25.8*   < > 25.2* 24.9* 28.1* 25.7*  MCV 90.2  --  90.8  --  92.3  --  91.8 91.5  PLT 158  --  167  --  163  --  191 190   < > = values in this interval not displayed.    Medications:  cefTRIAXone  (ROCEPHIN )  IV 2 g (10/22/24 0544)   heparin  1,850 Units/hr (10/22/24 0926)    (feeding supplement) PROSource Plus  30 mL Oral BID BM   atorvastatin   40 mg Oral Daily   carvedilol   3.125 mg Oral BID WC   Chlorhexidine  Gluconate Cloth  6 each Topical Q0600   ezetimibe   10 mg Oral Daily   hydrALAZINE   50 mg Oral Q8H   isosorbide  mononitrate  30 mg Oral Daily   pantoprazole   40 mg Oral BID   pneumococcal 20-valent conjugate vaccine  0.5 mL Intramuscular Tomorrow-1000   sacubitril -valsartan   1 tablet Oral BID    Dialysis Orders: [facility] - MWF 3:15hr, 400/800, EDW 65 kg, TDC, 3K/2.5 calcium .  Heparin : 800 units bolus, 400 units hourly.  Meds: Mircera 150 mcg every 2 weeks (last dose as an outpatient 12/29), Venofer 50 mg q. Weekly   Assessment/Plan: CAD, severe multivessel disease: Not a candidate for CABG, on medical therapy. Acute on Chronic systolic HF: EF at 40-45%. RHC with volume overload.  Volume status improving.  Continue max UF as tolerated.  Likely will need lower dry weight on discharge.  On Entresto , hydral/iso. S/p 1.9L paracentesis 1/5. ESRD: Usual MWF schedule. Next HD on 10/25/23. HTN: BP in goal. Continue carvedilol  3.125mg  BID, hydralazine  50mg  BID and entresto  49-51mg  BID.   Anemia of ESRD: Hgb 8.3.  ESA due Monday - ordered. Hematemesis: Known cirrhosis. GI consulted, s/p EGD 1/7: grade 1 varices was found. On PPI and empiric Ceftriaxone , off octreotide . Heparin  restarted yesterday.  Some bleeding reported overnight. Secondary HPTH: Ca/Phos ok - continue home meds Nutrition: Alb low, continue supps. Pt desiring heart-healthy diet rather than renal diet, ok for now - watch labs. pA-fib: Off Eliquis  at this time.  Manuelita Labella,  PA-C [facility] 10/22/2024,1:20 PM  LOS: 9 days    "

## 2024-10-22 NOTE — Progress Notes (Addendum)
 PHARMACY - ANTICOAGULATION CONSULT NOTE  Pharmacy Consult for IV heparin   Indication: atrial fibrillation  Allergies[1]  Patient Measurements: Height: 6' (182.9 cm) Weight: 66.9 kg (147 lb 7.8 oz) IBW/kg (Calculated) : 77.6  Vital Signs: Temp: 98.3 F (36.8 C) (01/10 2004) Temp Source: Oral (01/10 2004) BP: 123/78 (01/10 2004) Pulse Rate: 84 (01/10 2004)  Labs: Recent Labs    10/20/24 0525 10/20/24 2029 10/21/24 0517 10/21/24 2152 10/21/24 2152 10/22/24 0443 10/22/24 0822 10/22/24 1957  HGB 8.0* 8.0* 8.9*  --   --  8.3*  --   --   HCT 25.2* 24.9* 28.1*  --   --  25.7*  --   --   PLT 163  --  191  --   --  190  --   --   APTT  --   --   --  79*  --   --   --   --   HEPARINUNFRC  --   --   --  <0.10*   < > 0.12* 0.17* 0.15*  CREATININE 6.44*  --  7.81*  --   --   --   --   --    < > = values in this interval not displayed.    Estimated Creatinine Clearance: 9 mL/min (A) (by C-G formula based on SCr of 7.81 mg/dL (H)).   Medical History: Past Medical History:  Diagnosis Date   AKI (acute kidney injury) 08/30/2018   Anemia of chronic disease    Chronic systolic heart failure (HCC)    Diabetes mellitus without complication (HCC)    no meds since weight loss   Dialysis patient    Mon, Wed, Fri   ESRD (end stage renal disease) (HCC)    on HD (M,W,F)   Hyperkalemia 02/04/2021   Hypertension    Metabolic acidosis, increased anion gap 02/04/2021   Paroxysmal atrial fibrillation (HCC)    Renal disorder    Sepsis without acute organ dysfunction (HCC) 11/30/2023    Assessment: 64 yo male w/ hx afib on Eliquis  recently increased to 5 mg bid due to stroke risk (CHA2DS2/VAS of 3) but previously 2.5 mg bid subtherapeutic dose for cirrhosis/ESRD increased bleeding risk.  Pharmacy consulted for heparin  bridging.   Heparin  level 0.15, subtherapeutic  Bloody BM noted this morning and empirically treated for hemorrhoids. CBC stable. No further bloody BM per RN. No issues  with infusion noted.   Goal of Therapy:  Heparin  level 0.3-0.5 Monitor platelets by anticoagulation protocol: Yes   Plan:  Increase IV heparin  to 2000 units/hr (previously required higher dosing for therapeutic levels but starting conservatively given bleeding Check heparin  level and aPTT in 8 hrs Daily heparin  level, CBC and APTT F/u for bleeding and ability to resume Eliquis   Thank you for allowing pharmacy to participate in this patient's care.  Leonor GORMAN Bash, PharmD Emergency Medicine Clinical Pharmacist 10/22/2024,8:33 PM          [1] No Known Allergies

## 2024-10-22 NOTE — Progress Notes (Signed)
 " Progress Note   Patient: Casey Franco DOB: [birth year] DOA: 10/13/2024     9 DOS: the patient was seen and examined on 10/22/2024   Brief hospital course: Casey Franco was admitted to the hospital with the working diagnosis of unstable coronary artery disease.   64 y.o. male with a PMH significant for chronic HFrEF with LVEF 50% on most recent stress test, ESRD on HD MWF, HTN, HLD, PAF on Eliquis , hepatitis C and liver cirrhosis, presented with worsening of exertional dyspnea. Underwent stress test recently which showed a moderate size severe partially reversible defect involving the anterior wall apex and apical inferior segment consistent with scar and peri-infarct ischemia. LVEF 51%. Presented to [facility] for elective LHC and RHC. Cath found L main stenosis.  Patient was subsequently transferred to [facility] for cardiothoracic surgery input.  01/06 patient had episode of hematemesis, patient was placed on pantoprazole  and octreotide  01/07 EGD with grade I esophageal varices with no stigmata of bleeding. Superficial mucosal ter at the gastroesophageal junction with small protuberant vessel, clip placed.  01/08 positive hematochezia  01/09 no further hematochezia and hgb stable, resuming anticoagulation with heparin  IV  01/10 patient having mild hematochezia on IV heparin    Assessment and Plan: * CAD (coronary artery disease) 10/11/24 cardiac catheterization with 75% left main stenosis, 76% ostial LAD, occluded D2 and 99% m LAD.  Patient not candidate for PCI or CABG.   Plan to continue statin and ezetimibe .  Resumed anticoagulation with iV heparin    Acute on chronic systolic heart failure (HCC) Echocardiogram with reduced LV systolic function 40 to 45%,  global hypokinesis, interventricular septum flattened in diastole, RV systolic function with moderate reduction, RV with severe enlargement.   Volume status improved with ultrafiltration on HD  Continue with entresto   and carvedilol  Afterload reduction with hydralazine  and isosorbide    Paroxysmal atrial fibrillation (HCC) Continue rate control with carvedilol   Continue telemetry monitoring  Resumed anticoagulation with heparin  IV, because hematochezia, will hold on apixaban  for now and will follow up with GI recommendations   Essential hypertension Continue blood pressure monitoring Continue carvedilol  and entresto   Afterload reduction with hydralazine  and isosorbide    End-stage renal disease on hemodialysis (HCC) Hyperkalemia hyponatremia.   Pre HD K 4,7 BUN 62 and Na 137 bicarbonate 23   Continue renal replacement therapy per nephrology recommendations.  Other cirrhosis of liver (HCC) Portal hypertension and class I esophageal varices.  Acute upper GI bleed with acute blood loss anemia.  EGD with no active bleeding  Plan to continue pantoprazole   Off octreotide   Follow up hgb is 8,3 with with hematochezia on IV heparin   Re consulted GI for further recommendations.   Continue prophylactic antibiotic for SBP #5/7  Type II diabetes mellitus with renal manifestations (HCC) Continue glucose cover and monitoring with insulin  sliding scale   GERD (gastroesophageal reflux disease) Continue pantoprazole          Subjective: Patient with no abdominal pain, no hematemesis, but positive hematochezia, with bowel movements, mild amount of bright red blood   Physical Exam: Vitals:   10/22/24 0540 10/22/24 0739 10/22/24 0921 10/22/24 1100  BP: 117/77  121/74 (!) 131/93  Pulse:   86 87  Resp: 13     Temp: 98.3 F (36.8 C) 97.9 F (36.6 C)  98 F (36.7 C)  TempSrc: Oral Oral  Oral  SpO2: 97%   100%  Weight:      Height:       Neurology awake and  alert ENT with mild pallor Cardiovascular with S1 and S2 present and regular with no gallops or rubs, no murmurs Respiratory with no rales or wheezing, no rhonchi Abdomen soft and non tender, not distended No lower extremity edema   Data  Reviewed:    Family Communication: no family at the bedside   Disposition: Status is: Inpatient Remains inpatient appropriate because: recurrent bleeding   Planned Discharge Destination: Home      Author: Elidia Toribio Furnace, MD 10/22/2024 1:32 PM  For on call review [email or web address].  "

## 2024-10-22 NOTE — Progress Notes (Addendum)
 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    MELD 3.0: 27 at 11/28/2023  8:27 AM MELD-Na: 29 at 11/28/2023  8:27 AM   Continue twice daily pantoprazole  Continue carvedilol  twice daily Can discontinue Rocephin  after today  (fifth day )  Red blood in stool Patient describes red blood in stool this admission, along with hematemesis.  Possibly hemorrhoidal bleeding on anticoagulant.  He is up-to-date on colonoscopy - had a screening colonoscopy in February 2023 with findings of hemorrhoids, sigmoid diverticulosis, and removal of a 5 mm descending colon polyp  TODAY Had blood in stool with bowel movement again this morning but then no blood with bowel movement this afternoon. Hgb is stable at 8.3. **Refused rectal exam today.    Patient said he would notify the nurse if he had another episode of blood in stool, f Would at least empirically treat for hemorrhoids with steroid suppository twice daily -I will order.  He says his stools are already soft so no need for stool softeners.  Chronic anemia  His hemoglobin is stable at 9.2  Abdominal wall fluid collection CT scan 1/6 >> Large mixed density collection predominately centered over the anterior abdominal wall in the midline extending both above and below the level of the umbilicus measuring approximately 7.6 x 14.3 x 12.8 cm. Note that there may be a component of this collection extending into the peritoneum in the region of the previously seen focal walled-off fluid collection. These findings likely represent hemorrhagic debris/hematoma from recent intervention, although could represent progression with  complication of the previously seen fluid collection.   End-stage renal disease on HD  Multivessel CAD Acute on chronic systolic heart failure  AFib Eliquis  has been on hold for cardiac evaluation.    Diabetes   Hypertension   History colon polyps Small tubular adenoma February 2023   History of H. pylori gastritis February 2023 ( Dr. Unk)   See PMH for any additional medical history  / medical problems   Principal Problem:   CAD (coronary artery disease) Active Problems:   End-stage renal disease on hemodialysis (HCC)   Essential hypertension   GERD (gastroesophageal reflux disease)   Other cirrhosis of liver (HCC)   Type II diabetes mellitus with renal manifestations (HCC)   Paroxysmal atrial fibrillation (HCC)   Acute on chronic systolic heart failure (HCC)    Objective   GI studies:   Gastric biopsies 10/19/24  A. STOMACH,  BIOPSY:       Gastric antral / oxyntic mucosa without significant diagnostic  alteration.      No H. pylori identified on HE stain.       Negative for intestinal metaplasia or dysplasia.    Recent Labs    10/20/24 0525 10/20/24 2029 10/21/24 0517 10/22/24 0443  WBC 9.3  --  8.0 8.2  HGB 8.0* 8.0* 8.9* 8.3*  HCT 25.2* 24.9* 28.1* 25.7*  MCV 92.3  --  91.8 91.5  PLT 163  --  191 190   No results for input(s): FOLATE, VITAMINB12, FERRITIN, TIBC, IRONPCTSAT in the last 72 hours. Recent Labs    10/20/24 0525 10/21/24 9482  NA 134* 137  K 4.5 4.7  CL 96* 98  CO2 24 23  GLUCOSE 144* 116*  BUN 53* 61*  CREATININE 6.44* 7.81*  CALCIUM  8.4* 8.3*   Recent Labs    10/20/24 0525  ALBUMIN  2.8*    Scheduled inpatient medications:   (feeding supplement) PROSource Plus  30 mL Oral BID BM   atorvastatin   40 mg Oral Daily   carvedilol   3.125 mg Oral BID WC   Chlorhexidine  Gluconate Cloth  6 each Topical Q0600   ezetimibe   10 mg Oral Daily   hydrALAZINE   50 mg Oral Q8H   isosorbide  mononitrate  30 mg Oral Daily    pantoprazole   40 mg Oral BID   pneumococcal 20-valent conjugate vaccine  0.5 mL Intramuscular Tomorrow-1000   sacubitril -valsartan   1 tablet Oral BID   Continuous inpatient infusions:   cefTRIAXone  (ROCEPHIN )  IV 2 g (10/22/24 0544)   heparin  1,850 Units/hr (10/22/24 0926)   PRN inpatient medications: acetaminophen  **OR** acetaminophen , melatonin, ondansetron  (ZOFRAN ) IV  Vital signs in last 24 hours: Temp:  [97.9 F (36.6 C)-99 F (37.2 C)] 98 F (36.7 C) (01/10 1100) Pulse Rate:  [85-87] 87 (01/10 1100) Resp:  [13-20] 13 (01/10 0540) BP: (102-154)/(64-99) 154/99 (01/10 1500) SpO2:  [96 %-100 %] 100 % (01/10 1100) Last BM Date : 10/22/24  Intake/Output Summary (Last 24 hours) at 10/22/2024 1730 Last data filed at 10/22/2024 0013 Gross per 24 hour  Intake 480 ml  Output --  Net 480 ml    Intake/Output from previous day: 01/09 0701 - 01/10 0700 In: 2182.9 [P.O.:1440; I.V.:442.9; IV Piggyback:300] Out: 1500  Intake/Output this shift: No intake/output data recorded.   Physical Exam:  General: Alert male in NAD Heart:  Regular rate and rhythm.  Pulmonary: Normal respiratory effort Abdomen: Soft, firm and distended, mild mid right tenderness.  Normal bowel sounds. Extremities: No lower extremity edema  Neurologic: Alert and oriented Psych: Pleasant. Cooperative     LOS: 9 days   Vina Dasen ,NP 10/22/2024, 5:30 PM  ------------------------------------------------------------------------  I have taken a history, reviewed the chart and examined the patient. I performed a substantive portion of this encounter, including complete performance of at least one of the key components, in conjunction with the APP. I agree with the APP's note, impression and recommendations  Patient reports having 2 episodes of bright red blood per rectum early this morning.  He had a normal stool later this afternoon.  His hemoglobin has been stable. His last colonoscopy in 2023 showed  internal hemorrhoids and diverticulosis. Low suspicion for clinically significant bleed based on 2 episodes of small-volume hematochezia followed by a normal bowel movement and stable hemoglobin.  Suspect hemorrhoidal bleeding which very rarely causes clinically significant blood loss.  No plans for colonoscopy at this time. I would recommend continuing heparin .     Lakely Elmendorf E. Stacia, MD [facility]      "

## 2024-10-22 NOTE — Plan of Care (Signed)

## 2024-10-22 NOTE — Progress Notes (Addendum)
 PHARMACY - ANTICOAGULATION CONSULT NOTE  Pharmacy Consult for IV heparin   Indication: atrial fibrillation  Allergies[1]  Patient Measurements: Height: 6' (182.9 cm) Weight: 66.9 kg (147 lb 7.8 oz) IBW/kg (Calculated) : 77.6  Vital Signs: Temp: 98.3 F (36.8 C) (01/10 0540) Temp Source: Oral (01/10 0540) BP: 117/77 (01/10 0540) Pulse Rate: 86 (01/10 0012)  Labs: Recent Labs    10/19/24 0804 10/20/24 0525 10/20/24 2029 10/21/24 0517 10/21/24 2152 10/22/24 0443  HGB 8.2* 8.0* 8.0* 8.9*  --  8.3*  HCT 24.0* 25.2* 24.9* 28.1*  --  25.7*  PLT  --  163  --  191  --  190  APTT  --   --   --   --  79*  --   HEPARINUNFRC  --   --   --   --  <0.10* 0.12*  CREATININE 5.60* 6.44*  --  7.81*  --   --     Estimated Creatinine Clearance: 9 mL/min (A) (by C-G formula based on SCr of 7.81 mg/dL (H)).   Medical History: Past Medical History:  Diagnosis Date   AKI (acute kidney injury) 08/30/2018   Anemia of chronic disease    Chronic systolic heart failure (HCC)    Diabetes mellitus without complication (HCC)    no meds since weight loss   Dialysis patient    Mon, Wed, Fri   ESRD (end stage renal disease) (HCC)    on HD (M,W,F)   Hyperkalemia 02/04/2021   Hypertension    Metabolic acidosis, increased anion gap 02/04/2021   Paroxysmal atrial fibrillation (HCC)    Renal disorder    Sepsis without acute organ dysfunction (HCC) 11/30/2023    Assessment: 64 yo male w/ hx afib on Eliquis  recently increased to 5 mg bid due to stroke risk (CHA2DS2/VAS of 3) but previously 2.5 mg bid subtherapeutic dose for cirrhosis/ESRD increased bleeding risk.  Pharmacy consulted for heparin  bridging.   Heparin  level increased slightly but remains subtherapeutic at 0.17 after dose increase. No signs of bleeding or interruptions to the infusion noted. CBC stable. Will increase, but defer bolus given GIB.   Goal of Therapy:  Heparin  level 0.3-0.5 Monitor platelets by anticoagulation protocol:  Yes   Plan:  Increase IV heparin  to 1850 units/hr (previously required higher dosing for therapeutic levels but starting conservatively given bleeding). Check heparin  level and aPTT in 8 hrs. Daily heparin  level, CBC and APTT. F/u for bleeding and ability to resume Eliquis .  Elma Fail, PharmD PGY1 Clinical Pharmacist [facility]  10/22/2024 7:29 AM        [1] No Known Allergies

## 2024-10-23 ENCOUNTER — Ambulatory Visit: Payer: Self-pay | Admitting: Gastroenterology

## 2024-10-23 ENCOUNTER — Other Ambulatory Visit (HOSPITAL_COMMUNITY): Payer: Self-pay

## 2024-10-23 LAB — CULTURE, BLOOD (ROUTINE X 2)
Culture: NO GROWTH
Culture: NO GROWTH
Special Requests: ADEQUATE
Special Requests: ADEQUATE

## 2024-10-23 LAB — CBC
HCT: 24.1 % — ABNORMAL LOW (ref 39.0–52.0)
Hemoglobin: 7.7 g/dL — ABNORMAL LOW (ref 13.0–17.0)
MCH: 29.5 pg (ref 26.0–34.0)
MCHC: 32 g/dL (ref 30.0–36.0)
MCV: 92.3 fL (ref 80.0–100.0)
Platelets: 202 K/uL (ref 150–400)
RBC: 2.61 MIL/uL — ABNORMAL LOW (ref 4.22–5.81)
RDW: 16.3 % — ABNORMAL HIGH (ref 11.5–15.5)
WBC: 7.2 K/uL (ref 4.0–10.5)
nRBC: 0 % (ref 0.0–0.2)

## 2024-10-23 LAB — HEMOGLOBIN AND HEMATOCRIT, BLOOD
HCT: 24.2 % — ABNORMAL LOW (ref 39.0–52.0)
Hemoglobin: 7.9 g/dL — ABNORMAL LOW (ref 13.0–17.0)

## 2024-10-23 LAB — HEPARIN LEVEL (UNFRACTIONATED): Heparin Unfractionated: 0.28 [IU]/mL — ABNORMAL LOW (ref 0.30–0.70)

## 2024-10-23 MED ORDER — HYDRALAZINE HCL 50 MG PO TABS
50.0000 mg | ORAL_TABLET | Freq: Three times a day (TID) | ORAL | 0 refills | Status: AC
  Filled 2024-10-23: qty 90, 30d supply, fill #0

## 2024-10-23 MED ORDER — APIXABAN 5 MG PO TABS
5.0000 mg | ORAL_TABLET | Freq: Two times a day (BID) | ORAL | Status: DC
  Administered 2024-10-23: 5 mg via ORAL
  Filled 2024-10-23: qty 1

## 2024-10-23 MED ORDER — ATORVASTATIN CALCIUM 40 MG PO TABS
40.0000 mg | ORAL_TABLET | Freq: Every day | ORAL | 0 refills | Status: AC
  Filled 2024-10-23: qty 30, 30d supply, fill #0

## 2024-10-23 MED ORDER — ISOSORBIDE MONONITRATE ER 30 MG PO TB24
30.0000 mg | ORAL_TABLET | Freq: Every day | ORAL | 0 refills | Status: AC
  Filled 2024-10-23: qty 30, 30d supply, fill #0

## 2024-10-23 MED ORDER — HYDROCORTISONE ACETATE 25 MG RE SUPP
25.0000 mg | Freq: Two times a day (BID) | RECTAL | 0 refills | Status: AC
  Filled 2024-10-23: qty 12, 6d supply, fill #0

## 2024-10-23 MED ORDER — PANTOPRAZOLE SODIUM 40 MG PO TBEC
40.0000 mg | DELAYED_RELEASE_TABLET | Freq: Every day | ORAL | 0 refills | Status: AC
  Filled 2024-10-23: qty 30, 30d supply, fill #0

## 2024-10-23 MED ORDER — CARVEDILOL 3.125 MG PO TABS
3.1250 mg | ORAL_TABLET | Freq: Two times a day (BID) | ORAL | 0 refills | Status: AC
  Filled 2024-10-23: qty 60, 30d supply, fill #0

## 2024-10-23 MED ORDER — EZETIMIBE 10 MG PO TABS
10.0000 mg | ORAL_TABLET | Freq: Every day | ORAL | 0 refills | Status: AC
  Filled 2024-10-23: qty 30, 30d supply, fill #0

## 2024-10-23 MED ORDER — SACUBITRIL-VALSARTAN 49-51 MG PO TABS
1.0000 | ORAL_TABLET | Freq: Two times a day (BID) | ORAL | 0 refills | Status: AC
  Filled 2024-10-23: qty 60, 30d supply, fill #0

## 2024-10-23 NOTE — Discharge Summary (Addendum)
 " Physician Discharge Summary   Patient: [facility] MRN: [id number] DOB: Aug 05, 1961  Admit date:     10/13/2024  Discharge date: 10/23/2024  Discharge Physician: Elidia Toribio Furnace   PCP: [facility]   Recommendations at discharge:    Patient not candidate for coronary intervention due to high risk, recommended medical therapy, with statin and ezetimibe , no antiplatelet therapy due to the need for direct oral anticoagulation and risk of bleeding.  Keep Hgb 8 or greater if possible.  Guideline directed medical therapy for heart failure with entresto , carvedilol  and hydralazine /isosorbide .  Close follow up on serum K as outpatient  Follow up with [facility] as outpatient in 7 to 10 days Follow up with Cardiology, Nephrology and Gastroenterology as outpatient as scheduled.   Discharge Diagnoses: Principal Problem:   CAD (coronary artery disease) Active Problems:   Acute on chronic systolic heart failure (HCC)   Paroxysmal atrial fibrillation (HCC)   End-stage renal disease on hemodialysis (HCC)   Essential hypertension   Other cirrhosis of liver (HCC)   Type II diabetes mellitus with renal manifestations (HCC)   GERD (gastroesophageal reflux disease)   Hematochezia  Resolved Problems:   * No resolved [facility] problems. [facility] Course: [name] was admitted to the [facility] with the working diagnosis of unstable coronary artery disease, complicated with gastro intestinal bleeding.   64 y.o. male with a PMH significant for heart failure, ESRD on HD MWF, hypertension, hyperlipidemia, paroxysmal atrial fibrillation, hepatitis C and liver cirrhosis, who presented with worsening of exertional dyspnea. Underwent stress test recently which showed a moderate size severe partially reversible defect involving the anterior wall apex and apical inferior segment consistent with scar and peri-infarct ischemia. LVEF 51%. Presented to [facility] for elective LHC  and RHC. Cath found L main stenosis.   He was found to have anemia, and received PRBC transfusion x1 on 10/12/24.  Patient was subsequently transferred to [facility] for cardiothoracic surgery/ interventional cardiology evaluation for high risk PCI.  At the time of his transfer his blood pressure was 165/99, HR 77, 02 saturation 98% Lungs with no wheezing or rales, no rhonchi, heart with S1 and S2 present and regular, abdomen with no distention and no lower extremity edema.   Na 134, K 3.6 Cl 98 bicarbonate 22, glucose 123, BUN 47, cr 5,62  Mg 1,9  AST 19 ALT 8  Pro BNP > 35,000 Wbc 6,9 hgb 9,3 plt 172   Patient was placed on IV heparin  for anticoagulation.   01/02 cardiothoracic surgery consultation found patient high risk  for coronary bypass grafting, and recommendation to continue medical management.  Heart failure team consulted with recommendations for aggressive ultrafiltration for volume overload.  01/05 ultrasound paracentesis 1.9 L removed with good toleration.  01/06 patient had episode of hematemesis, patient was placed on pantoprazole  and octreotide  01/07 EGD with grade I esophageal varices with no stigmata of bleeding. Superficial mucosal ter at the gastroesophageal junction with small protuberant vessel, clip placed.  01/08 positive hematochezia  01/09 no further hematochezia and hgb stable, resuming anticoagulation with heparin  IV  01/10 patient having mild hematochezia on IV heparin   01/11 no further bleeding, plan to continue anticoagulation with apixaban  and follow up as outpatient.   Assessment and Plan: * CAD (coronary artery disease) 10/11/24 cardiac catheterization with 75% left main stenosis, 76% ostial LAD, occluded D2 and 99% m LAD.  Patient not candidate for PCI or CABG.   Plan to continue statin  and ezetimibe .  Not on antiplatelet therapy due to direct oral anticoagulant use.   Keep Hgb 8 or greater if possible.   Acute on chronic systolic heart  failure (HCC) Echocardiogram with reduced LV systolic function 40 to 45%,  global hypokinesis, interventricular septum flattened in diastole, RV systolic function with moderate reduction, RV with severe enlargement.   Biventricular failure.   Volume status improved with ultrafiltration on HD  Continue with entresto  and carvedilol  Afterload reduction with hydralazine  and isosorbide    Paroxysmal atrial fibrillation (HCC) Continue rate control with carvedilol   Patient will continue anticoagulation with apixaban    Essential hypertension Continue carvedilol  and entresto   Afterload reduction with hydralazine  and isosorbide    End-stage renal disease on hemodialysis (HCC) Hyperkalemia hyponatremia.   Patient with improved volume status.  Continue renal replacement therapy per nephrology recommendations, per his outpatient schedule.   Anemia of chronic renal disease, continue with EPO.   Metabolic bone disease continue with calcium  acetate.   Other cirrhosis of liver (HCC) Portal hypertension and class I esophageal varices.  Acute upper GI bleed with acute blood loss anemia.  EGD with no active bleeding Discharge hgb is 7,9 with hct 24.2   He has briefly placed on octreotide  and SBP prophylaxis with ceftriaxone  IV.  Paracentesis with 1.9 L removed, fluid analysis 85 nucleated cells with 86% monocytes. With no organisms and culture with no growth.  Continue pantoprazole  as outpatient   Type II diabetes mellitus with renal manifestations (HCC) Patient was placed on insulin  sliding scale for glucose cover and monitoring during his hospitalization.  Glucose remained well controlled  Continue statin for dyslipidemia    GERD (gastroesophageal reflux disease) Continue pantoprazole    Hemorrhoids Hematochezia has resolved. Continue topical therapy with steroids    Consultants: cardiology, CT surgery, gatro enterology and nephrology  Procedures performed: EGD. Paracentesis    Disposition: Home Diet recommendation:  Cardiac and Carb modified diet DISCHARGE MEDICATION: Allergies as of 10/23/2024   No Known Allergies      Medication List     STOP taking these medications    albumin  human 25 % bottle   amLODipine  10 MG tablet Commonly known as: NORVASC    feeding supplement Liqd   heparin  25000 UT/250ML infusion   hydrOXYzine  25 MG tablet Commonly known as: ATARAX    irbesartan  300 MG tablet Commonly known as: AVAPRO    melatonin 5 MG Tabs   metoprolol  tartrate 25 MG tablet Commonly known as: LOPRESSOR    omeprazole  20 MG capsule Commonly known as: PRILOSEC       TAKE these medications    apixaban  5 MG Tabs tablet Commonly known as: ELIQUIS  Take 1 tablet (5 mg total) by mouth 2 (two) times daily.   atorvastatin  40 MG tablet Commonly known as: LIPITOR Take 1 tablet (40 mg total) by mouth daily.   calcium  acetate 667 MG capsule Commonly known as: PHOSLO  Take 2 capsules (1,334 mg total) by mouth 3 (three) times a week.   carvedilol  3.125 MG tablet Commonly known as: COREG  Take 1 tablet (3.125 mg total) by mouth 2 (two) times daily with a meal.   epoetin  alfa-epbx 10000 UNIT/ML injection Commonly known as: RETACRIT  Inject 1 mL (10,000 Units total) into the vein every Monday, Wednesday, and Friday at 6 PM.   ezetimibe  10 MG tablet Commonly known as: ZETIA  Take 1 tablet (10 mg total) by mouth daily. Start taking on: October 24, 2024   hydrALAZINE  50 MG tablet Commonly known as: APRESOLINE  Take 1 tablet (50 mg total) by  mouth every 8 (eight) hours. What changed:  medication strength how much to take when to take this   hydrocortisone  25 MG suppository Commonly known as: ANUSOL -HC Place 1 suppository (25 mg total) rectally 2 (two) times daily.   isosorbide  mononitrate 30 MG 24 hr tablet Commonly known as: IMDUR  Take 1 tablet (30 mg total) by mouth daily. Start taking on: October 24, 2024   MULTIVITAMIN ADULT PO Take 1  tablet by mouth daily.   pantoprazole  40 MG tablet Commonly known as: PROTONIX  Take 1 tablet (40 mg total) by mouth daily.   sacubitril -valsartan  49-51 MG Commonly known as: ENTRESTO  Take 1 tablet by mouth 2 (two) times daily.   tamsulosin  0.4 MG Caps capsule Commonly known as: FLOMAX  Take 0.4 mg by mouth daily.        Discharge Exam: Filed Weights   10/20/24 0601 10/21/24 0438 10/21/24 0808  Weight: 61.9 kg 63.3 kg 66.9 kg   BP 117/72 (BP Location: Right Arm)   Pulse 81   Temp 98.1 F (36.7 C) (Oral)   Resp 20   Ht 6' (1.829 m)   Wt 66.9 kg   SpO2 100%   BMI 20.00 kg/m   Patient with no chest pain and no dyspnea, no nausea or vomiting, no abdominal pain, no melena or hematochezia, no hematemesis   Neurology awake and alert ENT with mild pallor with no icterus Cardiovascular with S1 and S2 present, and regular with no gallops, rubs or murmurs Respiratory with no rales or wheezing, no rhonchi Abdomen with no distention, soft and non tender No lower extremity edema   Condition at discharge: stable  The results of significant diagnostics from this hospitalization (including imaging, microbiology, ancillary and laboratory) are listed below for reference.   Imaging Studies: CT ABDOMEN PELVIS W CONTRAST Addendum Date: 10/18/2024 ADDENDUM REPORT: 10/18/2024 10:36 ADDENDUM: These results were called by telephone at the time of interpretation on 10/18/2024 at 10:34 a.m. to provider [facility] , who verbally acknowledged these results. Electronically Signed   By: Toribio Agreste M.D.   On: 10/18/2024 10:36   Result Date: 10/18/2024 CLINICAL DATA:  Abdominal pain and ascites. Possible small bowel obstruction. Recent paracentesis 10/17/2024. EXAM: CT ABDOMEN AND PELVIS WITH CONTRAST TECHNIQUE: Multidetector CT imaging of the abdomen and pelvis was performed using the standard protocol following bolus administration of intravenous contrast. RADIATION DOSE REDUCTION: This exam was  performed according to the departmental dose-optimization program which includes automated exposure control, adjustment of the mA and/or kV according to patient size and/or use of iterative reconstruction technique. CONTRAST:  75mL OMNIPAQUE  IOHEXOL  350 MG/ML SOLN COMPARISON:  01/21/2024, 11/26/2023 FINDINGS: Lower chest: Mild stable cardiomegaly. Calcified plaque over the 3 vessel coronary arteries. Mild calcification over the mitral valve annulus. Moderate right pleural effusion with bibasilar dependent atelectatic change. Hepatobiliary: Mild diffuse low-attenuation of the liver with mild nodular contour compatible with known cirrhosis. No focal liver mass. Gallbladder and biliary tree are normal. Pancreas: Normal. Spleen: Normal. Adrenals/Urinary Tract: Adrenal glands are normal. Stable atrophic kidneys with minimal cystic change. No hydronephrosis or nephrolithiasis. Bladder is contracted with mild ill definition of the wall. Ureters are normal. Stomach/Bowel: Stomach and small bowel are unremarkable. No dilated small bowel loops. Diverticulosis of the colon most prominent over the descending and sigmoid colon. Subtle wall thickening throughout much of the colon which could be seen due to mild acute colitis although may simply be related to patient's cirrhosis and hypoalbuminemia. Appendix is normal. Vascular/Lymphatic: Mild calcified plaque over the  abdominal aorta which is normal in caliber. Remaining vascular structures are unremarkable. No adenopathy. Reproductive: Prostate is unremarkable. Other: Mild-to-moderate ascites present without significant change from the prior exams. Note that the previous exam from 11/26/2023 demonstrate a large fluid and air collection throughout the abdomen/pelvis measuring approximately 28 cm in diameter which decreased in size on follow-up CT 01/21/2024 measuring 15 cm at which time it appeared to represent simple fluid. The current exam demonstrates a large mixed density  collection predominately centered over the anterior abdominal wall in the midline extending both above and below the level of the umbilicus. This measures approximately 7.6 x 14.3 cm in AP and transverse dimension and proximally 12.8 cm in craniocaudal dimension. Note that the may be a component of this collection extending into the peritoneum in the region of the previously seen focal walled-off fluid collection. These findings likely represent hemorrhagic debris/hematoma possibly from recent intervention. No acute vascular extravasation identified. Mild diffuse subcutaneous edema. Musculoskeletal: Screws noted. The spine unchanged. Minimal loss of mid prevertebral body height at the Schmorl's nodes involving T10 and T11 unchanged. IMPRESSION: 1. Large mixed density collection predominately centered over the anterior abdominal wall in the midline extending both above and below the level of the umbilicus measuring approximately 7.6 x 14.3 x 12.8 cm. Note that there may be a component of this collection extending into the peritoneum in the region of the previously seen focal walled-off fluid collection. These findings likely represent hemorrhagic debris/hematoma from recent intervention, although could represent progression with complication of the previously seen fluid collection. No acute vascular extravasation identified. 2. Mild-to-moderate ascites without significant change from the prior exams. 3. Evidence of patient's known cirrhosis. 4. Colonic diverticulosis. Subtle wall thickening throughout much of the colon which could be seen due to mild acute colitis, although may simply be related to patient's cirrhosis and hypoalbuminemia. 5. Moderate right pleural effusion with bibasilar dependent atelectatic change. 6. Aortic atherosclerosis. Atherosclerotic coronary artery disease. Aortic Atherosclerosis (ICD10-I70.0). Electronically Signed: By: Toribio Agreste M.D. On: 10/18/2024 10:27   DG Abd 1 View Result  Date: 10/18/2024 EXAM: 1 VIEW XRAY OF THE ABDOMEN 10/18/2024 06:38:33 AM COMPARISON: CT abdomen and pelvis 01/21/2024. CLINICAL HISTORY: Intractable abdominal pain. FINDINGS: BOWEL: Nonobstructive bowel gas pattern. There is a single mildly dilated small bowel segment in the right mid abdomen measuring 3.5 cm. There is colonic gas through at least to the mid-descending segment. SOFT TISSUES: Vascular calcifications are noted in the pelvis. The atrophic renal shadows are not well seen. BONES: No acute fracture. There is degenerative change of the lumbar spine and hips. There is no supine evidence of free air. IMPRESSION: 1. Single mildly dilated small bowel segment in the right mid abdomen, which may reflect focal ileus or early small bowel obstruction. 2. No supine evidence of free air. Electronically signed by: Francis Quam MD 10/18/2024 07:00 AM EST RP Workstation: [id number]   IR Paracentesis Result Date: 10/17/2024 INDICATION: 64 year old male. History HCV and end-stage renal disease with recurrent ascites. Request is for therapeutic and diagnostic paracentesis. EXAM: ULTRASOUND GUIDED THERAPEUTIC AND DIAGNOSTIC PARACENTESIS MEDICATIONS: Lidocaine  1% 10 mL COMPLICATIONS: None immediate. PROCEDURE: Informed written consent was obtained from the patient after a discussion of the risks, benefits and alternatives to treatment. A timeout was performed prior to the initiation of the procedure. Initial ultrasound scanning demonstrates a small amount of ascites within the left lower abdominal quadrant. The left lower abdomen was prepped and draped in the usual sterile fashion. 1% lidocaine  was used for  local anesthesia. Following this, a 19 gauge, 7-cm, Yueh catheter was introduced. An ultrasound image was saved for documentation purposes. The paracentesis was performed. The catheter was removed and a dressing was applied. The patient tolerated the procedure well without immediate post procedural complication.  FINDINGS: A total of approximately 1.9 L of straw-colored fluid was removed. Samples were sent to the laboratory as requested by the clinical team. IMPRESSION: Successful ultrasound-guided therapeutic and diagnostic paracentesis yielding 1.9 L of peritoneal fluid. Performed by Delon Beagle NP PLAN: If the patient eventually requires >/=2 paracenteses in a 30 day period, candidacy for formal evaluation by the [facility] will be assessed. Thom Hall, MD [facility] Electronically Signed   By: Thom Hall M.D.   On: 10/17/2024 10:22   ECHOCARDIOGRAM LIMITED Result Date: 10/15/2024    ECHOCARDIOGRAM LIMITED REPORT   Patient Name:   ANTAVIUS SPERBECK Howze Date of Exam: 10/15/2024 Medical Rec #:  [id number]       Height:       72.0 in Accession #:    [id number]      Weight:       156.3 lb Date of Birth:  June 01, 1961        BSA:          1.919 m Patient Age:    63 years        BP:           95/59 mmHg Patient Gender: M               HR:           80 bpm. Exam Location:  Inpatient Procedure: Limited Echo, Color Doppler and Cardiac Doppler (Both Spectral and            Color Flow Doppler were utilized during procedure). Indications:    CHF _- Acute Systolic I50.21  History:        Patient has prior history of Echocardiogram examinations, most                 recent 08/11/2024. Risk Factors:Hypertension.  Sonographer:    Tinnie Gosling RDCS Referring Phys: [id number] DALTON S MCLEAN IMPRESSIONS  1. Left ventricular ejection fraction, by estimation, is 40 to 45%. The left ventricle has mildly decreased function. The left ventricle demonstrates global hypokinesis. There is the interventricular septum is flattened in diastole ('D' shaped left ventricle), consistent with right ventricular volume overload.  2. Right ventricular systolic function is moderately reduced. The right ventricular size is severely enlarged. Tricuspid  regurgitation signal is inadequate for assessing PA pressure.  3. The mitral valve is degenerative. Trivial mitral valve regurgitation. No evidence of mitral stenosis. Moderate mitral annular calcification.  4. The aortic valve is calcified. There is severe calcifcation of the aortic valve. There is severe thickening of the aortic valve. Aortic valve regurgitation is not visualized. Aortic valve sclerosis/calcification is present, without any evidence of aortic stenosis.  5. The inferior vena cava is dilated in size with <50% respiratory variability, suggesting right atrial pressure of 15 mmHg. FINDINGS  Left Ventricle: Left ventricular ejection fraction, by estimation, is 40 to 45%. The left ventricle has mildly decreased function. The left ventricle demonstrates global hypokinesis. The left ventricular internal cavity size was normal in size. There is  no left ventricular hypertrophy. The interventricular septum is flattened in diastole ('D' shaped left ventricle), consistent with right ventricular volume overload. Right Ventricle: The right ventricular size is severely  enlarged. No increase in right ventricular wall thickness. Right ventricular systolic function is moderately reduced. Tricuspid regurgitation signal is inadequate for assessing PA pressure. Left Atrium: Left atrial size was not assessed. Right Atrium: Right atrial size was not assessed. Pericardium: There is no evidence of pericardial effusion. Mitral Valve: The mitral valve is degenerative in appearance. There is mild thickening of the mitral valve leaflet(s). Moderate mitral annular calcification. Trivial mitral valve regurgitation. No evidence of mitral valve stenosis. Tricuspid Valve: The tricuspid valve is normal in structure. Tricuspid valve regurgitation is mild . No evidence of tricuspid stenosis. Aortic Valve: The aortic valve is calcified. There is severe calcifcation of the aortic valve. There is severe thickening of the aortic valve.  Aortic valve regurgitation is not visualized. Aortic valve sclerosis/calcification is present, without any evidence of aortic stenosis. Pulmonic Valve: The pulmonic valve was normal in structure. Pulmonic valve regurgitation is not visualized. No evidence of pulmonic stenosis. Aorta: The aortic root is normal in size and structure. Venous: The inferior vena cava is dilated in size with less than 50% respiratory variability, suggesting right atrial pressure of 15 mmHg. IAS/Shunts: No atrial level shunt detected by color flow Doppler. LEFT VENTRICLE PLAX 2D LVIDd:         4.50 cm LVIDs:         3.40 cm LV PW:         1.10 cm LV IVS:        1.00 cm  LV Volumes (MOD) LV vol d, MOD A2C: 138.0 ml LV vol d, MOD A4C: 122.0 ml LV vol s, MOD A2C: 71.2 ml LV vol s, MOD A4C: 77.1 ml LV SV MOD A2C:     66.8 ml LV SV MOD A4C:     122.0 ml LV SV MOD BP:      57.7 ml IVC IVC diam: 2.15 cm Wilbert Bihari MD Electronically signed by Wilbert Bihari MD Signature Date/Time: 10/15/2024/3:49:35 PM    Final    US  Abdomen Limited Result Date: 10/15/2024 EXAM: LIMITED ABDOMINAL ULTRASOUND FOR ASCITES EVALUATION TECHNIQUE: Limited real-time sonography of all 4 quadrants of the abdomen was performed for evaluation of ascites. COMPARISON: US  Abdomen Limited 01/05/2024. CLINICAL HISTORY: Ascites. FINDINGS: RIGHT UPPER QUADRANT: Moderate to large ascites. LEFT UPPER QUADRANT: Moderate to large ascites. RIGHT LOWER QUADRANT: Moderate to large ascites. LEFT LOWER QUADRANT: Moderate to large ascites. OTHER: Limited visualization of the rest of the abdomen demonstrates no acute abnormality. IMPRESSION: 1. Moderate to large  ascites throughout the abdomen and pelvis. Electronically signed by: Norman Gatlin MD 10/15/2024 07:59 AM EST RP Workstation: [id number]   DG Chest 1 View Result Date: 10/11/2024 CLINICAL DATA:  Congestive heart failure EXAM: DG CHEST 1V COMPARISON:  12/23/2023 FINDINGS: Right-sided dialysis catheter tip overlies the SVC. The  heart is enlarged. Slight globular appearance of the cardiopericardial silhouette compared to prior may be due to differences in technique and positioning. Small to moderate right and small left pleural effusion. Vascular congestion. No confluent opacity. No pneumothorax. IMPRESSION: 1. Cardiomegaly with vascular congestion and bilateral pleural effusions, right greater than left. 2. Slight globular appearance of the cardiopericardial silhouette compared to prior may be due to differences in technique and positioning. Recommend correlation with echocardiogram to exclude pericardial effusion. Electronically Signed   By: Andrea Gasman M.D.   On: 10/11/2024 16:42   CARDIAC CATHETERIZATION Result Date: 10/11/2024 Conclusions: Severe LMCA and LAD disease with heavy calcification of the coronary arteries, as detailed below. Severely elevated left heart, right heart,  and pulmonary artery pressures.  Equalization of end-diastolic pressures noted, which can be seen with restrictive/constrictive physiology. Normal Fick cardiac output/index. Recommendation: Admit for fluid removal via hemodialysis and optimization of goal-directed medical therapy. Transfer to [facility] when bed available for cardiac surgery +/- advanced heart failure consultation. Aggressive secondary prevention of coronary artery disease. Initiate heparin  infusion in lieu of apixaban  8 hours after removal of femoral sheaths. Lonni Hanson, MD [facility]  LONG TERM MONITOR (3-14 DAYS) Result Date: 10/09/2024 Event monitor Patch Wear Time:  13 days and 19 hours (2025-11-22T14:19:20-0500 to 2025-12-06T09:44:05-499) Normal sinus rhythm Patient had a min HR of 59 bpm, max HR of 179 bpm, and avg HR of 81 bpm. 5 Ventricular Tachycardia runs occurred, the run with the fastest interval lasting 9 beats with a max rate of 179 bpm, the longest lasting 15 beats with an avg rate of 156 bpm. 15 Supraventricular Tachycardia/atrial tachycardia runs  occurred, the run with the fastest interval lasting 6 beats with a max rate of 145 bpm, the longest lasting 13 beats with an avg rate of 110 bpm.  Isolated SVEs were rare (<1.0%), SVE Couplets were rare (<1.0%), and SVE Triplets were rare (<1.0%). Isolated VEs were occasional (1.9%, [id number]), VE Couplets were rare (<1.0%, 63), and VE Triplets were rare (<1.0%, 3). Ventricular Bigeminy and Trigeminy were present. No patient triggered events recorded Signed, Velinda Lunger, MD, Ph.D [facility]   Microbiology: Results for orders placed or performed during the [facility] encounter of 10/13/24  Culture, body fluid w Gram Stain-bottle     Status: None   Collection Time: 10/17/24  8:40 AM   Specimen: Peritoneal Washings  Result Value Ref Range Status   Specimen Description PERITONEAL  Final   Special Requests NONE  Final   Culture   Final    NO GROWTH 5 DAYS Performed at [facility], [address], [place], [place] [address]    Report Status 10/22/2024 FINAL  Final  Gram stain     Status: None   Collection Time: 10/17/24  8:40 AM   Specimen: Peritoneal Washings  Result Value Ref Range Status   Specimen Description PERITONEAL  Final   Special Requests NONE  Final   Gram Stain   Final    MODERATE WBC PRESENT,BOTH PMN AND MONONUCLEAR NO ORGANISMS SEEN CYTOSPIN SMEAR Performed at [facility], [address] [place], [place] [address]    Report Status 10/17/2024 FINAL  Final  Culture, blood (Routine X 2) w Reflex to ID Panel     Status: None   Collection Time: 10/18/24  7:00 AM   Specimen: BLOOD  Result Value Ref Range Status   Specimen Description BLOOD SITE NOT SPECIFIED  Final   Special Requests   Final    BOTTLES DRAWN AEROBIC AND ANAEROBIC Blood Culture adequate volume   Culture   Final    NO GROWTH 5 DAYS Performed at [facility], [address], [place], [place] [address]    Report Status 10/23/2024 FINAL  Final  Culture, blood (Routine X 2) w Reflex to ID Panel      Status: None   Collection Time: 10/18/24  7:01 AM   Specimen: BLOOD  Result Value Ref Range Status   Specimen Description BLOOD SITE NOT SPECIFIED  Final   Special Requests   Final    BOTTLES DRAWN AEROBIC AND ANAEROBIC Blood Culture adequate volume   Culture   Final    NO GROWTH 5 DAYS  Performed at [facility], [address] [place], [place] [address]    Report Status 10/23/2024 FINAL  Final    Labs: CBC: Recent Labs  Lab 10/19/24 0439 10/19/24 0804 10/20/24 0525 10/20/24 2029 10/21/24 0517 10/22/24 0443 10/23/24 0446  WBC 10.5  --  9.3  --  8.0 8.2 7.2  HGB 8.4*   < > 8.0* 8.0* 8.9* 8.3* 7.7*  HCT 25.8*   < > 25.2* 24.9* 28.1* 25.7* 24.1*  MCV 90.8  --  92.3  --  91.8 91.5 92.3  PLT 167  --  163  --  191 190 202   < > = values in this interval not displayed.   Basic Metabolic Panel: Recent Labs  Lab 10/18/24 0700 10/18/24 1218 10/19/24 0804 10/20/24 0525 10/21/24 0517  NA 132* 133* 134* 134* 137  K 4.8 5.6* 4.7 4.5 4.7  CL 96* 96* 97* 96* 98  CO2 20* 20*  --  24 23  GLUCOSE 83 66* 87 144* 116*  BUN 66* 70* 35* 53* 61*  CREATININE 8.16* 8.39* 5.60* 6.44* 7.81*  CALCIUM  8.8* 8.6*  --  8.4* 8.3*  PHOS  --  4.8*  --  4.0  --    Liver Function Tests: Recent Labs  Lab 10/18/24 0700 10/18/24 1218 10/20/24 0525  AST 18  --   --   ALT 7  --   --   ALKPHOS 117  --   --   BILITOT 0.7  --   --   PROT 7.1  --   --   ALBUMIN  2.9* 2.8* 2.8*   CBG: No results for input(s): GLUCAP in the last 168 hours.  Discharge time spent: greater than 30 minutes.  Signed: Elidia Toribio Furnace, MD [facility] 10/23/2024 "

## 2024-10-23 NOTE — Progress Notes (Signed)
 Discharge Nurse Summary: DC order noted per MD. DC RN at bedside with patient. Patient agreeable with discharge plan, states family will arrive soon for pickup. AVS printed/reviewed. Provided thorough education on new medications, indications, benefits, and side effects. Reviewed CAD education, s/s to report to MD, and discussed risk for bleeding with blood thinner. PIVs removed, skin intact. No DME needs. No home meds. TOC meds delivered to the patient. CP/Edu resolved. Telemonitor not present on assessment. All belongings accounted for. Patient wheeled downstairs to the discharge lounge to await family pickup.  Rosario EMERSON Lund, RN

## 2024-10-23 NOTE — Progress Notes (Cosign Needed)
 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  MELD 3.0: 27 at 11/28/2023  8:27 AM MELD-Na: 29 at 11/28/2023  8:27 AM  Continue twice daily pantoprazole  Gastric biopsies this admission negative for H.pylori / malignancy Continue carvedilol  twice daily Follow up with primary GI - Dr. Unk GI will sign off   Red blood in stool Patient describes red blood in stool this admission, along with hematemesis.  Possibly hemorrhoidal bleeding on anticoagulant.    He is up-to-date on colonoscopy - had a screening colonoscopy in February 2023 with findings of hemorrhoids, sigmoid diverticulosis, and removal of a 5 mm descending colon polyp  TODAY:  No blood in stool this am. Declined rectal exam yesterday. Hgb down slightly overnight but overall stable at 7.7.   Complete course of BID steroid suppositories for presumed hemorrhoidal bleeding   Principal Problem:   CAD (coronary artery disease) Active Problems:   End-stage renal disease on hemodialysis [facility])   Essential hypertension   GERD (gastroesophageal reflux disease)   Other cirrhosis of liver (HCC)   Type II diabetes mellitus with renal manifestations (HCC)   Paroxysmal atrial fibrillation (HCC)   Acute on chronic systolic heart failure (HCC)   Hematochezia    Objective    Recent Labs    10/21/24 0517 10/22/24 0443 10/23/24 0446  WBC 8.0 8.2 7.2  HGB 8.9* 8.3* 7.7*  HCT 28.1* 25.7* 24.1*  MCV 91.8 91.5 92.3  PLT 191 190 202   No results for input(s): FOLATE, VITAMINB12, FERRITIN, TIBC, IRONPCTSAT in the last 72 hours. Recent Labs    10/21/24 0517  NA 137  K 4.7  CL 98  CO2 23  GLUCOSE 116*  BUN 61*  CREATININE 7.81*  CALCIUM  8.3*   No results for  input(s): PROT, ALBUMIN , AST, ALT, ALKPHOS, BILITOT, BILIDIR, IBILI in the last 72 hours.    Imaging:  CT ABDOMEN PELVIS W CONTRAST Addendum: ADDENDUM REPORT: 10/18/2024 10:36   ADDENDUM:  These results were called by telephone at the time of interpretation  on 10/18/2024 at 10:34 a.m. to provider [facility] , who verbally  acknowledged these results.   Electronically Signed    By: Toribio Agreste M.D.    On: 10/18/2024 10:36 Narrative: CLINICAL DATA:  Abdominal pain and ascites. Possible small bowel obstruction. Recent paracentesis 10/17/2024.  EXAM: CT ABDOMEN AND PELVIS WITH CONTRAST  TECHNIQUE: Multidetector CT imaging of the abdomen and pelvis was performed using the standard protocol following bolus administration of intravenous contrast.  RADIATION DOSE REDUCTION: This exam was performed according to the departmental dose-optimization program which includes automated exposure control, adjustment of the mA and/or kV according to patient size and/or use of iterative reconstruction technique.  CONTRAST:  75mL OMNIPAQUE  IOHEXOL  350 MG/ML SOLN  COMPARISON:  01/21/2024, 11/26/2023  FINDINGS: Lower chest: Mild stable cardiomegaly. Calcified plaque over the 3 vessel coronary arteries. Mild calcification over the mitral valve annulus. Moderate right pleural effusion with bibasilar dependent atelectatic change.  Hepatobiliary: Mild diffuse low-attenuation of the liver with mild nodular contour compatible with known cirrhosis. No focal liver mass. Gallbladder and biliary tree are normal.  Pancreas: Normal.  Spleen: Normal.  Adrenals/Urinary Tract: Adrenal glands are normal. Stable atrophic kidneys with minimal cystic change. No hydronephrosis or  nephrolithiasis. Bladder is contracted with mild ill definition of the wall. Ureters are normal.  Stomach/Bowel: Stomach and small bowel are unremarkable. No dilated small bowel loops. Diverticulosis of the  colon most prominent over the descending and sigmoid colon. Subtle wall thickening throughout much of the colon which could be seen due to mild acute colitis although may simply be related to patient's cirrhosis and hypoalbuminemia. Appendix is normal.  Vascular/Lymphatic: Mild calcified plaque over the abdominal aorta which is normal in caliber. Remaining vascular structures are unremarkable. No adenopathy.  Reproductive: Prostate is unremarkable.  Other: Mild-to-moderate ascites present without significant change from the prior exams. Note that the previous exam from 11/26/2023 demonstrate a large fluid and air collection throughout the abdomen/pelvis measuring approximately 28 cm in diameter which decreased in size on follow-up CT 01/21/2024 measuring 15 cm at which time it appeared to represent simple fluid.  The current exam demonstrates a large mixed density collection predominately centered over the anterior abdominal wall in the midline extending both above and below the level of the umbilicus. This measures approximately 7.6 x 14.3 cm in AP and transverse dimension and proximally 12.8 cm in craniocaudal dimension. Note that the may be a component of this collection extending into the peritoneum in the region of the previously seen focal walled-off fluid collection. These findings likely represent hemorrhagic debris/hematoma possibly from recent intervention. No acute vascular extravasation identified.  Mild diffuse subcutaneous edema.  Musculoskeletal: Screws noted. The spine unchanged. Minimal loss of mid prevertebral body height at the Schmorl's nodes involving T10 and T11 unchanged.  IMPRESSION: 1. Large mixed density collection predominately centered over the anterior abdominal wall in the midline extending both above and below the level of the umbilicus measuring approximately 7.6 x 14.3 x 12.8 cm. Note that there may be a component of this collection extending  into the peritoneum in the region of the previously seen focal walled-off fluid collection. These findings likely represent hemorrhagic debris/hematoma from recent intervention, although could represent progression with complication of the previously seen fluid collection. No acute vascular extravasation identified. 2. Mild-to-moderate ascites without significant change from the prior exams. 3. Evidence of patient's known cirrhosis. 4. Colonic diverticulosis. Subtle wall thickening throughout much of the colon which could be seen due to mild acute colitis, although may simply be related to patient's cirrhosis and hypoalbuminemia. 5. Moderate right pleural effusion with bibasilar dependent atelectatic change. 6. Aortic atherosclerosis. Atherosclerotic coronary artery disease.  Aortic Atherosclerosis (ICD10-I70.0).  Electronically Signed: By: Toribio Agreste M.D. On: 10/18/2024 10:27 DG Abd 1 View EXAM: 1 VIEW XRAY OF THE ABDOMEN 10/18/2024 06:38:33 AM  COMPARISON: CT abdomen and pelvis 01/21/2024.  CLINICAL HISTORY: Intractable abdominal pain.  FINDINGS:  BOWEL: Nonobstructive bowel gas pattern. There is a single mildly dilated small bowel segment in the right mid abdomen measuring 3.5 cm. There is colonic gas through at least to the mid-descending segment.  SOFT TISSUES: Vascular calcifications are noted in the pelvis. The atrophic renal shadows are not well seen.  BONES: No acute fracture. There is degenerative change of the lumbar spine and hips.  There is no supine evidence of free air.  IMPRESSION: 1. Single mildly dilated small bowel segment in the right mid abdomen, which may reflect focal ileus or early small bowel obstruction. 2. No supine evidence of free air.  Electronically signed by: Francis Quam MD 10/18/2024 07:00 AM EST RP Workstation: [id number]     Scheduled inpatient medications:   (feeding supplement) PROSource Plus  30 mL Oral  BID BM    atorvastatin   40 mg Oral Daily   carvedilol   3.125 mg Oral BID WC   Chlorhexidine  Gluconate Cloth  6 each Topical Q0600   ezetimibe   10 mg Oral Daily   hydrALAZINE   50 mg Oral Q8H   hydrocortisone   25 mg Rectal BID   isosorbide  mononitrate  30 mg Oral Daily   pantoprazole   40 mg Oral BID   pneumococcal 20-valent conjugate vaccine  0.5 mL Intramuscular Tomorrow-1000   sacubitril -valsartan   1 tablet Oral BID   Continuous inpatient infusions:   cefTRIAXone  (ROCEPHIN )  IV 2 g (10/23/24 9392)   heparin  2,050 Units/hr (10/23/24 0814)   PRN inpatient medications: acetaminophen  **OR** acetaminophen , melatonin, ondansetron  (ZOFRAN ) IV  Vital signs in last 24 hours: Temp:  [97.7 F (36.5 C)-98.4 F (36.9 C)] 98.1 F (36.7 C) (01/11 0751) Pulse Rate:  [81-87] 81 (01/11 0751) Resp:  [16-20] 20 (01/11 0751) BP: (114-154)/(69-99) 117/72 (01/11 0751) SpO2:  [100 %] 100 % (01/11 0751) Last BM Date : 10/22/24  Intake/Output Summary (Last 24 hours) at 10/23/2024 1044 Last data filed at 10/23/2024 0900 Gross per 24 hour  Intake 1154.45 ml  Output --  Net 1154.45 ml    Intake/Output from previous day: No intake/output data recorded. Intake/Output this shift: Total I/O In: 1154.5 [P.O.:480; I.V.:674.5] Out: -    Physical Exam:  General: Alert male in NAD Heart:  Regular rate and rhythm.  Pulmonary: Normal respiratory effort Abdomen: Soft, distended, firm area over umbilicus,  nontender. Normal bowel sounds. Neurologic: Alert and oriented Psych: Pleasant. Cooperative     LOS: 10 days   Vina Dasen ,NP 10/23/2024, 10:44 AM     ' "

## 2024-10-23 NOTE — Plan of Care (Signed)

## 2024-10-23 NOTE — Assessment & Plan Note (Addendum)
 Hematochezia has resolved. Continue topical therapy with steroids

## 2024-10-23 NOTE — Progress Notes (Signed)
 " [facility] Progress Note   Subjective:   Patient seen and examined at bedside.  Reports feeling much better this AM.  Denies hematochezia or melena overnight.  Slept well.  Denies CP, SOB, abdominal pain, nausea, vomiting and diarrhea.   Gastric biopsies negative for H pylori, malignancy or portal hypertensive gastropathy.   Objective Vitals:   10/22/24 2004 10/23/24 0010 10/23/24 0559 10/23/24 0751  BP: 123/78 114/69  117/72  Pulse: 84 84 85 81  Resp: 16 18  20   Temp:  97.7 F (36.5 C) 98.4 F (36.9 C) 98.1 F (36.7 C)  TempSrc: Oral Oral Oral Oral  SpO2: 100% 100% 100% 100%  Weight:      Height:       Physical Exam General:well appearing male in NAD Heart:RRR, no mrg Lungs:CTAB, nml WOB on RA Abdomen:soft, NTND Extremities:no LE edema Dialysis Access: [facility]   Filed Weights   10/20/24 0601 10/21/24 0438 10/21/24 0808  Weight: 61.9 kg 63.3 kg 66.9 kg    Intake/Output Summary (Last 24 hours) at 10/23/2024 1150 Last data filed at 10/23/2024 0900 Gross per 24 hour  Intake 1154.45 ml  Output --  Net 1154.45 ml    Additional Objective Labs: Basic Metabolic Panel: Recent Labs  Lab 10/18/24 1218 10/19/24 0804 10/20/24 0525 10/21/24 0517  NA 133* 134* 134* 137  K 5.6* 4.7 4.5 4.7  CL 96* 97* 96* 98  CO2 20*  --  24 23  GLUCOSE 66* 87 144* 116*  BUN 70* 35* 53* 61*  CREATININE 8.39* 5.60* 6.44* 7.81*  CALCIUM  8.6*  --  8.4* 8.3*  PHOS 4.8*  --  4.0  --    Liver Function Tests: Recent Labs  Lab 10/18/24 0700 10/18/24 1218 10/20/24 0525  AST 18  --   --   ALT 7  --   --   ALKPHOS 117  --   --   BILITOT 0.7  --   --   PROT 7.1  --   --   ALBUMIN  2.9* 2.8* 2.8*   CBC: Recent Labs  Lab 10/19/24 0439 10/19/24 0804 10/20/24 0525 10/20/24 2029 10/21/24 0517 10/22/24 0443 10/23/24 0446 10/23/24 1105  WBC 10.5  --  9.3  --  8.0 8.2 7.2  --   HGB 8.4*   < > 8.0*   < > 8.9* 8.3* 7.7* 7.9*  HCT 25.8*   < > 25.2*   < > 28.1* 25.7* 24.1*  24.2*  MCV 90.8  --  92.3  --  91.8 91.5 92.3  --   PLT 167  --  163  --  191 190 202  --    < > = values in this interval not displayed.   Blood Culture    Component Value Date/Time   SDES BLOOD SITE NOT SPECIFIED 10/18/2024 0701   SPECREQUEST  10/18/2024 0701    BOTTLES DRAWN AEROBIC AND ANAEROBIC Blood Culture adequate volume   CULT  10/18/2024 0701    NO GROWTH 5 DAYS Performed at [facility], [address] [place], [place] [address]    REPTSTATUS 10/23/2024 FINAL 10/18/2024 0701    Medications:   (feeding supplement) PROSource Plus  30 mL Oral BID BM   apixaban   5 mg Oral BID   atorvastatin   40 mg Oral Daily   carvedilol   3.125 mg Oral BID WC   Chlorhexidine  Gluconate Cloth  6 each Topical Q0600   ezetimibe   10 mg Oral Daily   hydrALAZINE   50 mg Oral Q8H   hydrocortisone   25 mg Rectal BID   isosorbide  mononitrate  30 mg Oral Daily   pantoprazole   40 mg Oral BID   pneumococcal 20-valent conjugate vaccine  0.5 mL Intramuscular Tomorrow-1000   sacubitril -valsartan   1 tablet Oral BID    Dialysis Orders: [facility] - MWF 3:15hr, 400/800, EDW 65 kg, TDC, 3K/2.5 calcium .  Heparin : 800 units bolus, 400 units hourly.  Meds: Mircera 150 mcg every 2 weeks (last dose as an outpatient 12/29), Venofer 50 mg q. Weekly   Assessment/Plan: CAD, severe multivessel disease: Not a candidate for CABG, on medical therapy. Acute on Chronic systolic HF: EF at 40-45%. RHC with volume overload.  Volume status improving.  Continue max UF as tolerated.  Likely will need lower dry weight on discharge.  On Entresto , hydral/iso. S/p 1.9L paracentesis 1/5. ESRD: Usual MWF schedule. Next HD on 10/25/23. HTN: BP in goal. Continue carvedilol  3.125mg  BID, hydralazine  50mg  BID and entresto  49-51mg  BID.   Anemia of ESRD: Hgb7.9.  ESA due Monday - ordered. Hematemesis: Known cirrhosis. GI consulted, s/p EGD 1/7: grade 1 varices was found. On PPI and empiric Ceftriaxone , off octreotide .  Gastric biopsies negative for H pylori, malignancy or portal hypertensive gastropathy. No additional bleeding. F/u with outpatient GI. Secondary HPTH: Ca/Phos ok - continue home meds Nutrition: Alb low, continue supps. Pt desiring heart-healthy diet rather than renal diet, ok for now - watch labs. pA-fib: Eliquis  restarted.   Manuelita Labella, PA-C [facility] 10/23/2024,11:50 AM  LOS: 10 days    "

## 2024-10-23 NOTE — Progress Notes (Signed)
 Casey Franco,  The biopsies of your stomach were unremarkable.  There was no evidence of H. Pylori infection or portal hypertensive gastropathy.  Recommend you follow up with Dr. Unk at [facility] for ongoing management of your cirrhosis.

## 2024-10-23 NOTE — Progress Notes (Addendum)
 PHARMACY - ANTICOAGULATION CONSULT NOTE  Pharmacy Consult for IV heparin  >> apixaban  Indication: atrial fibrillation  Allergies[1]  Patient Measurements: Height: 6' (182.9 cm) Weight:  (pt refused) IBW/kg (Calculated) : 77.6  Vital Signs: Temp: 98.4 F (36.9 C) (01/11 0559) Temp Source: Oral (01/11 0559) BP: 114/69 (01/11 0010) Pulse Rate: 85 (01/11 0559)  Labs: Recent Labs    10/21/24 0517 10/21/24 2152 10/21/24 2152 10/22/24 0443 10/22/24 0822 10/22/24 1957 10/23/24 0446  HGB 8.9*  --   --  8.3*  --   --  7.7*  HCT 28.1*  --   --  25.7*  --   --  24.1*  PLT 191  --   --  190  --   --  202  APTT  --  79*  --   --   --   --   --   HEPARINUNFRC  --  <0.10*   < > 0.12* 0.17* 0.15* 0.28*  CREATININE 7.81*  --   --   --   --   --   --    < > = values in this interval not displayed.    Estimated Creatinine Clearance: 9 mL/min (A) (by C-G formula based on SCr of 7.81 mg/dL (H)).   Medical History: Past Medical History:  Diagnosis Date   AKI (acute kidney injury) 08/30/2018   Anemia of chronic disease    Chronic systolic heart failure (HCC)    Diabetes mellitus without complication (HCC)    no meds since weight loss   Dialysis patient    Mon, Wed, Fri   ESRD (end stage renal disease) (HCC)    on HD (M,W,F)   Hyperkalemia 02/04/2021   Hypertension    Metabolic acidosis, increased anion gap 02/04/2021   Paroxysmal atrial fibrillation (HCC)    Renal disorder    Sepsis without acute organ dysfunction (HCC) 11/30/2023    Assessment: 64 yo male w/ hx afib on Eliquis  recently increased to 5 mg bid due to stroke risk (CHA2DS2/VAS of 3) but previously 2.5 mg bid subtherapeutic dose for cirrhosis/ESRD increased bleeding risk.  Pharmacy consulted for heparin  bridging.   Heparin  level 0.28, subtherapeutic but increasing appropriately after dose increase. Per RN, no s/sx bleeding or interruptions to the infusion. CBC stable with downtrend in Hgb (8.3 > 7.7). Will give  slight dose increase to get patient therapeutic and continue to monitor.   Pharmacy consulted for heparin  >> apixaban .    Goal of Therapy:  Heparin  level 0.3-0.5 Monitor platelets by anticoagulation protocol: Yes   Plan:  Stop heparin  infusion Resume PTA Eliquis  (5mg  PO BID)  Thank you for allowing pharmacy to participate in this patient's care.  Elma Fail, PharmD PGY1 Clinical Pharmacist [facility]  10/23/2024 7:20 AM            [1] No Known Allergies

## 2024-10-24 NOTE — Progress Notes (Signed)
 Pt was d/c yesterday. Contacted [facility] this morning to be advised of pt's d/c date and that pt should have resumed care this morning. Clinic states pt did not come for treatment this morning. Will fax d/c summary and last renal not for continuation of care.   Randine Mungo Dialysis Navigator [phone]

## 2024-10-24 NOTE — Progress Notes (Unsigned)
 " Cardiology Office Note   Date:  10/24/2024  ID:  Casey Franco, DOB 1961/06/19, MRN [id number] PCP: [facility]  [facility] Providers Cardiologist:  Evalene Lunger, MD Cardiology APP:  Lorene Lesley CROME, PA-C { Click to update primary MD,subspecialty MD or APP then REFRESH:1}    History of Present Illness Casey Franco is a 64 y.o. male with past medical history of hypertension, end-stage renal disease on dialysis, bradycardia, hepatitis C, liver cirrhosis secondary to HCV, ascites, varices, heart failure with mildly reduced ejection fraction 40-45%, and atrial fibrillation,   Previously hospitalized 2/13 - 12/31/2023 originally presented with 3 weeks of abdominal pain.  He was found to have spontaneous bacterial peritonitis completing 2 weeks of antibiotics.  Troponin was mildly elevated with downtrending felt likely due to demand ischemia.  During admission he had an episode of atrial fibrillation and was started on low-dose metoprolol .  Given high risk of bleeding due to liver disease and kidney disease he was started on subtherapeutic dose of apixaban  2.5 mg daily.  Echocardiogram revealed an LVEF of 40 to 45%, moderate concentric LVH and mildly reduced RV systolic function, moderately enlarged RV size, moderately increased LV wall thickness, small pericardial effusion, moderate MR.  He was seen by Dr. Gollan 06/07/2024 reporting feeling fairly well with some orthopnea.  He had issues with bleeding from his right catheter access site for several weeks and stopped apixaban  2 weeks prior to that visit.  Bleeding has since resolved but he did not resume the apixaban .  He was noted to be in atrial flutter that was rate controlled.  It was recommended he resume apixaban  2.5 mg twice daily.  Echocardiogram 08/01/2024 showed an EF of 35 to 40% with global hypokinesis, mild LVH, severely reduced RV systolic function, severely enlarged RV size, moderately dilated LA, moderate to  severe MR.  He was called and given these findings he was recommended to increase his apixaban  to 5 mg twice daily in an effort to pursue cardioversion after 3 weeks.  He was last seen in clinic 08/30/2024 where he been doing reasonably well from a cardiac perspective.  He had reported low energy level symptoms determined to start walking for exercise.  He reported dyspnea while walking long distances but notices it more less when he walks he gets short of breath.  He has occasional orthopnea and PND.  He had been compliant with apixaban  and denied issues with bleeding.  It was recommended that he wear a ZIO XT monitor for 2 weeks to assess his A-fib burden to further guide management.  He was sent for updated CBC and BMP.  He was also scheduled for Lexiscan  Myoview  to assess for ischemic causes of cardiomyopathy.  He was hospitalized at [facility] 1/1 - 10/23/2024.  He was originally admitted to [facility] after an elective right and left heart catheterization.  Cath felt left main stenosis.  He was found to have anemia and received PRBC transfusion x 1 on 10/02/2024.  He was then transferred to [facility] for cardiothoracic surgery/interventional cardiology evaluation high risk PCI.  At the time of his transfer vital signs were stable.  He was placed on IV heparin  for anticoagulation.  1/2 CVTS evaluated and felt patient high risk for coronary bypass graft and recommended to continue medical therapy.  Heart failure team was consulted with recommendations for aggressive ultrafiltration for volume overload.  1/5.  Ultrasound-guided paracentesis with 1.9 L removed.  1/16 episode of hematemesis was  placed on Protonix  and octreotide .  1/11 EGD with grade 1 esophageal varices with no stigmata of bleeding, superficial mucosal tear to the gastroesophageal junction with small protuberant bands with a clip placed.  1/80 he was positive for hematochezia.  1/9 no further hematochezia stable hemoglobin he was  also resumed on anticoagulation with IV heparin .  1/10 he started having mild hematochezia on IV heparin .  1/11 he had no further bleeding and the plan was to continue anticoagulation with apixaban  and follow-up as an outpatient.  Unfortunately he was evaluated by the cardiology and CVTS and was not a candidate for PCI or CABG.  Echocardiogram revealed LV systolic function of 40 to 45%, global hypokinesis, intraventricular septum flattened in diastole, RV systolic function with moderate reduction, RV with severe enlargement.  Biventricular failure.  Volume status improved with ultrafiltration hemodialysis.  He was continued on Entresto  carvedilol , afterload reduction with Entresto  leading isosorbide .  Several medications were discontinued on discharge such as amlodipine , irbesartan , metoprolol  to tartrate, and omeprazole .   He returns to clinic today  ROS: 10 point review of system has been reviewed and considered negative the exception was listed in HPI  Studies Reviewed     2d echo 10/15/2024 1. Left ventricular ejection fraction, by estimation, is 40 to 45%. The  left ventricle has mildly decreased function. The left ventricle  demonstrates global hypokinesis. There is the interventricular septum is  flattened in diastole ('D' shaped left  ventricle), consistent with right ventricular volume overload.   2. Right ventricular systolic function is moderately reduced. The right  ventricular size is severely enlarged. Tricuspid regurgitation signal is  inadequate for assessing PA pressure.   3. The mitral valve is degenerative. Trivial mitral valve regurgitation.  No evidence of mitral stenosis. Moderate mitral annular calcification.   4. The aortic valve is calcified. There is severe calcifcation of the  aortic valve. There is severe thickening of the aortic valve. Aortic valve  regurgitation is not visualized. Aortic valve sclerosis/calcification is  present, without any evidence of  aortic  stenosis.   5. The inferior vena cava is dilated in size with <50% respiratory  variability, suggesting right atrial pressure of 15 mmHg.   R/LHC 10/11/2024 Conclusions: Severe LMCA and LAD disease with heavy calcification of the coronary arteries, as detailed below. Severely elevated left heart, right heart, and pulmonary artery pressures.  Equalization of end-diastolic pressures noted, which can be seen with restrictive/constrictive physiology. Normal Fick cardiac output/index.   Recommendation: Admit for fluid removal via hemodialysis and optimization of goal-directed medical therapy. Transfer to [facility] when bed available for cardiac surgery +/- advanced heart failure consultation. Aggressive secondary prevention of coronary artery disease. Initiate heparin  infusion in lieu of apixaban  8 hours after removal of femoral sheaths.   Lexiscan  MPI 09/20/2024   Abnormal pharmacologic myocardial perfusion stress test.   There is a moderate in size, severe, partially reversible defect involving the anterior wall, apex, and apical inferior segment consistent with scar and peri-infarct ischemia.   Left ventricular systolic function is low normal to mildly reduced (LVEF 51%) with anterior and apical hypokinesis.   Extensive coronary artery calcification is evident on the attenuation correction CT.   Moderate right pleural effusion is present with right lower lobe atelectasis versus consolidation.  Effusion appears larger in size compared to prior CT of the abdomen and pelvis from 01/21/2024.  Trivial left pleural effusion and abdominal ascites are also noted.   This is an intermediate risk study.   08/11/2024 Echo  complete 1. Left ventricular ejection fraction, by estimation, is 35 to 40%. The  left ventricle has moderately decreased function. The left ventricle  demonstrates global hypokinesis. There is mild left ventricular  hypertrophy. Left ventricular diastolic  parameters are  indeterminate. There is the interventricular septum is  flattened in systole, consistent with right ventricular pressure overload.  The average left ventricular global longitudinal strain is -7.3 %. The  global longitudinal strain is  abnormal.   2. Right ventricular systolic function is severely reduced. The right  ventricular size is severely enlarged. There is normal pulmonary artery  systolic pressure. The estimated right ventricular systolic pressure is  30.2 mmHg.   3. Left atrial size was moderately dilated.   4. Right atrial size was severely dilated.   5. The mitral valve is normal in structure. Moderate to severe mitral  valve regurgitation. No evidence of mitral stenosis. Moderate to severe  mitral annular calcification.   6. Tricuspid valve regurgitation is mild to moderate.   7. The aortic valve is calcified. Aortic valve regurgitation is not  visualized. Aortic valve sclerosis/calcification is present, without any  evidence of aortic stenosis. Aortic valve mean gradient measures 3.0 mmHg.   8. The inferior vena cava is dilated in size with <50% respiratory  variability, suggesting right atrial pressure of 15 mmHg.    11/2023 Echo complete 1. Left ventricular ejection fraction, by estimation, is 40 to 45%. The  left ventricle has mildly decreased function. The left ventricle  demonstrates regional wall motion abnormalities (see scoring  diagram/findings for description). The left ventricular   internal cavity size was mildly to moderately dilated. There is moderate  concentric left ventricular hypertrophy. Indeterminate diastolic filling  due to E-A fusion. The global longitudinal strain is abnormal.   2. T.   3. Right ventricular systolic function is mildly reduced. The right  ventricular size is moderately enlarged. Moderately increased right  ventricular wall thickness.   4. Right atrial size was moderately dilated.   5. A small pericardial effusion is present. The  pericardial effusion is  circumferential.   6. The mitral valve is grossly normal. Moderate mitral valve  regurgitation.   7. The aortic valve is calcified. Aortic valve regurgitation is not  visualized. Aortic valve sclerosis/calcification is present, without any  evidence of aortic stenosis.   Risk Assessment/Calculations  CHA2DS2-VASc Score = 3  {Confirm score is correct.  If not, click here to update score.  REFRESH note.  :1} This indicates a 3.2% annual risk of stroke. The patient's score is based upon: CHF History: 1 HTN History: 1 Diabetes History: 1 Stroke History: 0 Vascular Disease History: 0 Age Score: 0 Gender Score: 0   {This patient has a significant risk of stroke if diagnosed with atrial fibrillation.  Please consider VKA or DOAC agent for anticoagulation if the bleeding risk is acceptable.   You can also use the SmartPhrase .[email or web address] for documentation.   :[id number]}         Physical Exam VS:  There were no vitals taken for this visit.       Wt Readings from Last 3 Encounters:  10/21/24 147 lb 7.8 oz (66.9 kg)  10/12/24 150 lb 5.7 oz (68.2 kg)  09/27/24 143 lb (64.9 kg)    GEN: Well nourished, well developed in no acute distress NECK: No JVD; No carotid bruits CARDIAC: ***RRR, no murmurs, rubs, gallops RESPIRATORY:  Clear to auscultation without rales, wheezing or rhonchi  ABDOMEN: Soft, non-tender, non-distended EXTREMITIES:  No  edema; No deformity   ASSESSMENT AND PLAN Coronary artery disease Paroxysmal atrial fibrillation Biventricular heart failure Hypertension End-stage renal disease Anemia of chronic disease    {Are you ordering a CV Procedure (e.g. stress test, cath, DCCV, TEE, etc)?   Press F2        :[id number]}  Dispo: ***  Signed, Elford Evilsizer, NP   "

## 2024-10-25 ENCOUNTER — Ambulatory Visit: Admitting: Cardiology

## 2024-11-22 ENCOUNTER — Ambulatory Visit: Admitting: Cardiovascular Disease
